# Patient Record
Sex: Female | Born: 1935 | Race: White | Hispanic: No | Smoking: Never smoker
Health system: Southern US, Community
[De-identification: ages and names within clinical notes are randomized; demographics above are authoritative.]

## PROBLEM LIST (undated history)

## (undated) DIAGNOSIS — R87619 Unspecified abnormal cytological findings in specimens from cervix uteri: Secondary | ICD-10-CM

## (undated) DIAGNOSIS — R519 Headache, unspecified: Secondary | ICD-10-CM

## (undated) DIAGNOSIS — E785 Hyperlipidemia, unspecified: Secondary | ICD-10-CM

## (undated) DIAGNOSIS — K644 Residual hemorrhoidal skin tags: Secondary | ICD-10-CM

## (undated) DIAGNOSIS — E213 Hyperparathyroidism, unspecified: Secondary | ICD-10-CM

## (undated) DIAGNOSIS — J45909 Unspecified asthma, uncomplicated: Secondary | ICD-10-CM

## (undated) DIAGNOSIS — K635 Polyp of colon: Secondary | ICD-10-CM

## (undated) DIAGNOSIS — R51 Headache: Secondary | ICD-10-CM

## (undated) DIAGNOSIS — K219 Gastro-esophageal reflux disease without esophagitis: Secondary | ICD-10-CM

## (undated) DIAGNOSIS — K589 Irritable bowel syndrome without diarrhea: Secondary | ICD-10-CM

## (undated) DIAGNOSIS — M1990 Unspecified osteoarthritis, unspecified site: Secondary | ICD-10-CM

## (undated) DIAGNOSIS — T7840XA Allergy, unspecified, initial encounter: Secondary | ICD-10-CM

## (undated) DIAGNOSIS — I10 Essential (primary) hypertension: Secondary | ICD-10-CM

## (undated) DIAGNOSIS — R42 Dizziness and giddiness: Secondary | ICD-10-CM

## (undated) DIAGNOSIS — M810 Age-related osteoporosis without current pathological fracture: Secondary | ICD-10-CM

## (undated) HISTORY — PX: POLYPECTOMY: SHX149

## (undated) HISTORY — DX: Unspecified osteoarthritis, unspecified site: M19.90

## (undated) HISTORY — DX: Residual hemorrhoidal skin tags: K64.4

## (undated) HISTORY — DX: Headache: R51

## (undated) HISTORY — DX: Hyperparathyroidism, unspecified: E21.3

## (undated) HISTORY — DX: Hyperlipidemia, unspecified: E78.5

## (undated) HISTORY — DX: Age-related osteoporosis without current pathological fracture: M81.0

## (undated) HISTORY — PX: UPPER GASTROINTESTINAL ENDOSCOPY: SHX188

## (undated) HISTORY — DX: Unspecified asthma, uncomplicated: J45.909

## (undated) HISTORY — DX: Essential (primary) hypertension: I10

## (undated) HISTORY — DX: Polyp of colon: K63.5

## (undated) HISTORY — DX: Irritable bowel syndrome without diarrhea: K58.9

## (undated) HISTORY — DX: Unspecified abnormal cytological findings in specimens from cervix uteri: R87.619

## (undated) HISTORY — PX: CATARACT EXTRACTION, BILATERAL: SHX1313

## (undated) HISTORY — DX: Allergy, unspecified, initial encounter: T78.40XA

## (undated) HISTORY — DX: Dizziness and giddiness: R42

## (undated) HISTORY — DX: Gastro-esophageal reflux disease without esophagitis: K21.9

## (undated) HISTORY — DX: Headache, unspecified: R51.9

## (undated) HISTORY — PX: COLONOSCOPY: SHX174

## (undated) HISTORY — PX: TONSILLECTOMY: SUR1361

---

## 1972-05-15 HISTORY — PX: BACK SURGERY: SHX140

## 1999-07-14 ENCOUNTER — Encounter: Payer: Self-pay | Admitting: Obstetrics and Gynecology

## 1999-07-14 ENCOUNTER — Encounter: Admission: RE | Admit: 1999-07-14 | Discharge: 1999-07-14 | Payer: Self-pay | Admitting: Obstetrics and Gynecology

## 2000-07-16 ENCOUNTER — Encounter: Admission: RE | Admit: 2000-07-16 | Discharge: 2000-07-16 | Payer: Self-pay | Admitting: Obstetrics and Gynecology

## 2000-07-16 ENCOUNTER — Encounter: Payer: Self-pay | Admitting: Obstetrics and Gynecology

## 2000-10-03 ENCOUNTER — Ambulatory Visit (HOSPITAL_COMMUNITY): Admission: RE | Admit: 2000-10-03 | Discharge: 2000-10-03 | Payer: Self-pay | Admitting: Gastroenterology

## 2000-10-03 ENCOUNTER — Encounter (INDEPENDENT_AMBULATORY_CARE_PROVIDER_SITE_OTHER): Payer: Self-pay | Admitting: Specialist

## 2000-11-29 ENCOUNTER — Ambulatory Visit (HOSPITAL_COMMUNITY): Admission: RE | Admit: 2000-11-29 | Discharge: 2000-11-29 | Payer: Self-pay | Admitting: *Deleted

## 2001-01-08 ENCOUNTER — Encounter: Payer: Self-pay | Admitting: *Deleted

## 2001-01-08 ENCOUNTER — Encounter: Admission: RE | Admit: 2001-01-08 | Discharge: 2001-01-08 | Payer: Self-pay | Admitting: *Deleted

## 2001-01-16 ENCOUNTER — Encounter: Payer: Self-pay | Admitting: *Deleted

## 2001-01-16 ENCOUNTER — Encounter: Admission: RE | Admit: 2001-01-16 | Discharge: 2001-01-16 | Payer: Self-pay | Admitting: *Deleted

## 2001-02-04 ENCOUNTER — Ambulatory Visit (HOSPITAL_COMMUNITY): Admission: RE | Admit: 2001-02-04 | Discharge: 2001-02-04 | Payer: Self-pay | Admitting: *Deleted

## 2001-02-04 ENCOUNTER — Encounter (INDEPENDENT_AMBULATORY_CARE_PROVIDER_SITE_OTHER): Payer: Self-pay | Admitting: *Deleted

## 2001-06-13 ENCOUNTER — Ambulatory Visit (HOSPITAL_COMMUNITY): Admission: RE | Admit: 2001-06-13 | Discharge: 2001-06-13 | Payer: Self-pay | Admitting: *Deleted

## 2001-06-13 ENCOUNTER — Encounter: Payer: Self-pay | Admitting: *Deleted

## 2001-07-22 ENCOUNTER — Encounter: Payer: Self-pay | Admitting: Obstetrics and Gynecology

## 2001-07-22 ENCOUNTER — Encounter: Admission: RE | Admit: 2001-07-22 | Discharge: 2001-07-22 | Payer: Self-pay | Admitting: Obstetrics and Gynecology

## 2001-07-25 ENCOUNTER — Encounter: Admission: RE | Admit: 2001-07-25 | Discharge: 2001-07-25 | Payer: Self-pay | Admitting: Obstetrics and Gynecology

## 2001-07-25 ENCOUNTER — Encounter: Payer: Self-pay | Admitting: Obstetrics and Gynecology

## 2001-09-05 ENCOUNTER — Encounter: Payer: Self-pay | Admitting: Family Medicine

## 2001-09-05 ENCOUNTER — Encounter: Admission: RE | Admit: 2001-09-05 | Discharge: 2001-09-05 | Payer: Self-pay | Admitting: Family Medicine

## 2002-07-28 ENCOUNTER — Encounter: Admission: RE | Admit: 2002-07-28 | Discharge: 2002-07-28 | Payer: Self-pay | Admitting: Obstetrics and Gynecology

## 2002-07-28 ENCOUNTER — Encounter: Payer: Self-pay | Admitting: Obstetrics and Gynecology

## 2003-02-20 ENCOUNTER — Encounter: Payer: Self-pay | Admitting: Family Medicine

## 2003-02-20 ENCOUNTER — Encounter: Admission: RE | Admit: 2003-02-20 | Discharge: 2003-02-20 | Payer: Self-pay | Admitting: Family Medicine

## 2003-06-23 ENCOUNTER — Encounter: Admission: RE | Admit: 2003-06-23 | Discharge: 2003-08-17 | Payer: Self-pay | Admitting: Family Medicine

## 2003-08-03 ENCOUNTER — Encounter: Admission: RE | Admit: 2003-08-03 | Discharge: 2003-08-03 | Payer: Self-pay | Admitting: Family Medicine

## 2003-11-10 ENCOUNTER — Encounter: Admission: RE | Admit: 2003-11-10 | Discharge: 2003-11-10 | Payer: Self-pay | Admitting: Family Medicine

## 2003-11-11 ENCOUNTER — Encounter: Admission: RE | Admit: 2003-11-11 | Discharge: 2003-11-11 | Payer: Self-pay | Admitting: Family Medicine

## 2004-08-08 ENCOUNTER — Encounter: Admission: RE | Admit: 2004-08-08 | Discharge: 2004-08-08 | Payer: Self-pay | Admitting: Obstetrics and Gynecology

## 2004-10-04 ENCOUNTER — Encounter: Admission: RE | Admit: 2004-10-04 | Discharge: 2004-10-04 | Payer: Self-pay | Admitting: Allergy and Immunology

## 2005-08-14 ENCOUNTER — Encounter: Admission: RE | Admit: 2005-08-14 | Discharge: 2005-08-14 | Payer: Self-pay | Admitting: Obstetrics and Gynecology

## 2005-08-22 ENCOUNTER — Encounter: Admission: RE | Admit: 2005-08-22 | Discharge: 2005-08-22 | Payer: Self-pay | Admitting: Obstetrics and Gynecology

## 2005-09-12 ENCOUNTER — Ambulatory Visit: Payer: Self-pay | Admitting: Gastroenterology

## 2005-09-18 ENCOUNTER — Ambulatory Visit: Payer: Self-pay | Admitting: Gastroenterology

## 2005-09-18 ENCOUNTER — Encounter (INDEPENDENT_AMBULATORY_CARE_PROVIDER_SITE_OTHER): Payer: Self-pay | Admitting: Specialist

## 2005-12-14 ENCOUNTER — Encounter: Admission: RE | Admit: 2005-12-14 | Discharge: 2005-12-14 | Payer: Self-pay | Admitting: Family Medicine

## 2006-07-24 ENCOUNTER — Encounter: Admission: RE | Admit: 2006-07-24 | Discharge: 2006-08-22 | Payer: Self-pay | Admitting: Family Medicine

## 2006-08-27 ENCOUNTER — Encounter: Admission: RE | Admit: 2006-08-27 | Discharge: 2006-08-27 | Payer: Self-pay | Admitting: Family Medicine

## 2006-10-18 ENCOUNTER — Ambulatory Visit: Payer: Self-pay | Admitting: Gastroenterology

## 2006-11-06 ENCOUNTER — Ambulatory Visit: Payer: Self-pay | Admitting: Gastroenterology

## 2007-02-21 ENCOUNTER — Encounter: Admission: RE | Admit: 2007-02-21 | Discharge: 2007-04-04 | Payer: Self-pay | Admitting: Family Medicine

## 2007-09-02 ENCOUNTER — Encounter: Admission: RE | Admit: 2007-09-02 | Discharge: 2007-09-02 | Payer: Self-pay | Admitting: Obstetrics and Gynecology

## 2007-09-09 DIAGNOSIS — E785 Hyperlipidemia, unspecified: Secondary | ICD-10-CM | POA: Insufficient documentation

## 2007-09-09 DIAGNOSIS — I10 Essential (primary) hypertension: Secondary | ICD-10-CM | POA: Insufficient documentation

## 2007-09-09 DIAGNOSIS — E7800 Pure hypercholesterolemia, unspecified: Secondary | ICD-10-CM

## 2008-06-15 ENCOUNTER — Encounter: Admission: RE | Admit: 2008-06-15 | Discharge: 2008-06-15 | Payer: Self-pay | Admitting: Orthopedic Surgery

## 2008-06-15 HISTORY — PX: CARPAL TUNNEL RELEASE: SHX101

## 2008-06-18 ENCOUNTER — Ambulatory Visit (HOSPITAL_BASED_OUTPATIENT_CLINIC_OR_DEPARTMENT_OTHER): Admission: RE | Admit: 2008-06-18 | Discharge: 2008-06-18 | Payer: Self-pay | Admitting: Orthopedic Surgery

## 2008-09-07 ENCOUNTER — Encounter: Admission: RE | Admit: 2008-09-07 | Discharge: 2008-09-07 | Payer: Self-pay | Admitting: Obstetrics and Gynecology

## 2008-12-07 ENCOUNTER — Inpatient Hospital Stay (HOSPITAL_COMMUNITY): Admission: EM | Admit: 2008-12-07 | Discharge: 2008-12-08 | Payer: Self-pay | Admitting: Emergency Medicine

## 2008-12-07 ENCOUNTER — Encounter (INDEPENDENT_AMBULATORY_CARE_PROVIDER_SITE_OTHER): Payer: Self-pay | Admitting: Internal Medicine

## 2008-12-08 ENCOUNTER — Encounter: Admission: RE | Admit: 2008-12-08 | Discharge: 2008-12-08 | Payer: Self-pay | Admitting: Internal Medicine

## 2008-12-24 ENCOUNTER — Encounter: Admission: RE | Admit: 2008-12-24 | Discharge: 2008-12-24 | Payer: Self-pay | Admitting: Family Medicine

## 2009-04-13 ENCOUNTER — Ambulatory Visit: Payer: Self-pay | Admitting: Gastroenterology

## 2009-04-26 ENCOUNTER — Encounter (INDEPENDENT_AMBULATORY_CARE_PROVIDER_SITE_OTHER): Payer: Self-pay | Admitting: *Deleted

## 2009-04-28 ENCOUNTER — Ambulatory Visit: Payer: Self-pay | Admitting: Gastroenterology

## 2009-05-10 ENCOUNTER — Ambulatory Visit: Payer: Self-pay | Admitting: Gastroenterology

## 2009-05-11 ENCOUNTER — Encounter: Payer: Self-pay | Admitting: Gastroenterology

## 2009-09-13 ENCOUNTER — Encounter: Admission: RE | Admit: 2009-09-13 | Discharge: 2009-09-13 | Payer: Self-pay | Admitting: Obstetrics and Gynecology

## 2009-11-12 ENCOUNTER — Encounter: Admission: RE | Admit: 2009-11-12 | Discharge: 2009-11-12 | Payer: Self-pay | Admitting: Family Medicine

## 2010-01-14 ENCOUNTER — Encounter: Admission: RE | Admit: 2010-01-14 | Discharge: 2010-01-14 | Payer: Self-pay | Admitting: Family Medicine

## 2010-01-25 ENCOUNTER — Emergency Department (HOSPITAL_COMMUNITY): Admission: EM | Admit: 2010-01-25 | Discharge: 2010-01-25 | Payer: Self-pay | Admitting: Emergency Medicine

## 2010-03-31 ENCOUNTER — Encounter
Admission: RE | Admit: 2010-03-31 | Discharge: 2010-05-11 | Payer: Self-pay | Source: Home / Self Care | Attending: Family Medicine | Admitting: Family Medicine

## 2010-05-30 ENCOUNTER — Encounter: Payer: Self-pay | Admitting: Gastroenterology

## 2010-06-05 ENCOUNTER — Encounter: Payer: Self-pay | Admitting: Obstetrics and Gynecology

## 2010-06-07 ENCOUNTER — Other Ambulatory Visit: Payer: Self-pay | Admitting: Gastroenterology

## 2010-06-07 ENCOUNTER — Ambulatory Visit
Admission: RE | Admit: 2010-06-07 | Discharge: 2010-06-07 | Payer: Self-pay | Source: Home / Self Care | Attending: Gastroenterology | Admitting: Gastroenterology

## 2010-06-07 ENCOUNTER — Encounter (INDEPENDENT_AMBULATORY_CARE_PROVIDER_SITE_OTHER): Payer: Self-pay | Admitting: *Deleted

## 2010-06-08 LAB — CBC WITH DIFFERENTIAL/PLATELET
Eosinophils Relative: 2.8 % (ref 0.0–5.0)
MCV: 94.5 fl (ref 78.0–100.0)
Monocytes Absolute: 0.5 10*3/uL (ref 0.1–1.0)
Neutrophils Relative %: 60.5 % (ref 43.0–77.0)
Platelets: 253 10*3/uL (ref 150.0–400.0)
WBC: 7.6 10*3/uL (ref 4.5–10.5)

## 2010-06-08 LAB — HEPATIC FUNCTION PANEL
AST: 18 U/L (ref 0–37)
Alkaline Phosphatase: 81 U/L (ref 39–117)
Total Bilirubin: 0.5 mg/dL (ref 0.3–1.2)

## 2010-06-08 LAB — BASIC METABOLIC PANEL
GFR: 71.34 mL/min (ref >60.00)
Glucose, Bld: 129 mg/dL — ABNORMAL HIGH (ref 70–99)
Potassium: 4.4 mEq/L (ref 3.5–5.1)
Sodium: 141 mEq/L (ref 135–145)

## 2010-06-08 LAB — IBC PANEL
Iron: 106 ug/dL (ref 42–145)
Saturation Ratios: 25 % (ref 20.0–50.0)
Transferrin: 303.2 mg/dL (ref 212.0–360.0)

## 2010-06-08 LAB — FOLATE: Folate: >24.8 ng/mL (ref >5.9)

## 2010-06-13 ENCOUNTER — Encounter: Payer: Self-pay | Admitting: Gastroenterology

## 2010-06-13 ENCOUNTER — Ambulatory Visit
Admission: RE | Admit: 2010-06-13 | Discharge: 2010-06-13 | Payer: Self-pay | Source: Home / Self Care | Attending: Gastroenterology | Admitting: Gastroenterology

## 2010-06-16 NOTE — Assessment & Plan Note (Signed)
Summary: Consult for colon   History of Present Illness Visit Type: Follow-up Visit Primary GI MD: [name] MD FACP FAGA Primary Provider: Catha Gosselin, MD  Requesting Provider: n/a Chief Complaint: constipation & hemorrhoids ongoing problem History of Present Illness:   77 year old Caucasian female that I have followed for many years for recurrent colon polyps with removal of a right colon adenoma one year ago. There was no high-grade dysplasia or evidence of carcinoma. She has had colonoscopies going back for many years, and suffers from persistent perianal irritation from a prominent anterior skin tag and external hemorrhoids.  She has known hepatic cysts  and a probable small hepatic hemangioma per previous CT scans. She had been a previous patient of [facility], and Dr. Kinnie Scales. She also suffers from chronic dyspepsia, chronic constipation, and is currently on daily MiraLax, Nexium, Colace, and folic acid.  She also now complains of intermittent pain in her right lower quadrant, rectal discomfort, but no rectal bleeding. Her appetite is good and her weight has been stable. She comes in now for pre-evaluation for upcoming colonoscopy followup.   GI Review of Systems    Reports abdominal pain, bloating, and  nausea.     Location of  Abdominal pain: right side.    Denies acid reflux, belching, chest pain, dysphagia with liquids, dysphagia with solids, heartburn, loss of appetite, vomiting, vomiting blood, weight loss, and  weight gain.      Reports constipation and  hemorrhoids.     Denies anal fissure, black tarry stools, change in bowel habit, diarrhea, diverticulosis, fecal incontinence, heme positive stool, irritable bowel syndrome, jaundice, light color stool, liver problems, rectal bleeding, and  rectal pain.    Current Medications (verified): 1)  Lopressor Hct 50-25 Mg Tabs (Metoprolol-Hydrochlorothiazide) .... Take 1 Tablet By Mouth Two Times A Day 2)  Crestor  20 Mg Tabs (Rosuvastatin Calcium) .... 1/2 Tablet By Mouth Once Daily 3)  Folic Acid 1 Mg Tabs (Folic Acid) .Marland Kitchen.. 1 By Mouth Qd 4)  Zetia 10 Mg Tabs (Ezetimibe) .... One Tablet By Mouth Once Daily 5)  Ester-C 500-550 Mg Tabs (Bioflavonoid Products) .... One Tablet By Mouth Once Daily 6)  Citracal Plus  Tabs (Multiple Minerals-Vitamins) .... One Tablet By Mouth Once Daily 7)  Nexium 40 Mg Cpdr (Esomeprazole Magnesium) .Marland Kitchen.. 1 By Mouth Qd 8)  Colace 100 Mg Caps (Docusate Sodium) .Marland Kitchen.. 1 By Mouth Bid 9)  Benicar 20 Mg Tabs (Olmesartan Medoxomil) .... Take 1 Tablet By Mouth Once Daily 10)  Spironolactone 25 Mg Tabs (Spironolactone) .... Take 1 Tablet By Mouth Once Daily 11)  Benefiber  Powd (Wheat Dextrin) .... Take 2 Teaspoons As Directed Daily  Allergies (verified): 1)  ! Amoxicillin 2)  ! Flagyl  Past History:  Past medical, surgical, family and social histories (including risk factors) reviewed for relevance to current acute and chronic problems.  Past Medical History: Reviewed history from 04/13/2009 and no changes required. Current Problems:  ADENOMATOUS COLONIC POLYP (ICD-211.3) HELICOBACTER PYLORI INFECTION, HX OF (ICD-V12.71) HYPERCHOLESTEROLEMIA (ICD-272.0) HYPERTENSION (ICD-401.9) EXTERNAL HEMORRHOIDS (ICD-455.3)  Past Surgical History: Back Surgery Carpal Tunnel Release  Family History: Reviewed history from 04/13/2009 and no changes required. No FH of Colon Cancer:  Social History: Reviewed history from 04/13/2009 and no changes required. Occupation: Retired Widowed  Patient has never smoked.  Alcohol Use - no Illicit Drug Use - no Daily Caffeine Use: Occ   Review of Systems       The patient complains of back pain, headaches-new,  sleeping problems, and swelling of feet/legs.  The patient denies allergy/sinus, anemia, anxiety-new, arthritis/joint pain, blood in urine, breast changes/lumps, change in vision, confusion, cough, coughing up blood, depression-new,  fainting, fatigue, fever, hearing problems, heart murmur, heart rhythm changes, itching, menstrual pain, muscle pains/cramps, night sweats, nosebleeds, pregnancy symptoms, shortness of breath, skin rash, sore throat, swollen lymph glands, thirst - excessive , urination - excessive , urination changes/pain, urine leakage, vision changes, and voice change.    Vital Signs:  Patient profile:   77 year old female Height:      62 inches Weight:      152.50 pounds BMI:     27.99 Pulse rate:   72 / minute Pulse rhythm:   regular BP sitting:   134 / 76  (left arm) Cuff size:   regular  Vitals Entered By: June McMurray CMA Duncan Dull) (June 07, 2010 3:14 PM)  Physical Exam  General:  Well developed, well nourished, no acute distress.healthy appearing.   Head:  Normocephalic and atraumatic. Eyes:  PERRLA, no icterus.exam deferred to patient's ophthalmologist.   Abdomen:  Soft, nontender and nondistended. No masses, hepatosplenomegaly or hernias noted. Normal bowel sounds. Rectal:  Prominent anterior skin tag with some external hemorrhoidal tissue but no fissures or fistulae. Rectal exam shows no masses or tenderness with the stool that is trace guaiac positive. Msk:  Symmetrical with no gross deformities. Normal posture. Extremities:  No clubbing, cyanosis, edema or deformities noted. Neurologic:  Alert and  oriented x4;  grossly normal neurologically. Psych:  Alert and cooperative. Normal mood and affect.depressed affect.     Impression & Recommendations:  Problem # 1:  DYSPEPSIA, CHRONIC (ICD-536.8) Assessment Improved continue Nexium which she uses on a p.r.n. basis.She has been treated in the past for Helicobacter infection with good response.  Problem # 2:  ADENOMATOUS COLONIC POLYP (ICD-211.3) Assessment: Unchanged colonoscopy followup ASAP.labs have been ordered today including CBC and anemia profile.  Problem # 3:  CONSTIPATION (ICD-564.00) Assessment: Improved continue MiraLax,  Benefiber, Colace, and liberal p.o. fluids as tolerated.  Other Orders: TLB-CBC Platelet - w/Differential (85025-CBCD) TLB-BMP (Basic Metabolic Panel-BMET) (80048-METABOL) TLB-Hepatic/Liver Function Pnl (80076-HEPATIC) TLB-TSH (Thyroid Stimulating Hormone) (84443-TSH) TLB-B12, Serum-Total ONLY (34742-V95) TLB-Ferritin (82728-FER) TLB-Folic Acid (Folate) (82746-FOL) TLB-IBC Pnl (Iron/FE;Transferrin) (83550-IBC) Colonoscopy (Colon)  Patient Instructions: 1)  Copy sent to :  Catha Gosselin, MD  2)  Please go to the basement today for your labs.  3)  Your prescription(s) have been sent to you pharmacy.  4)  Your procedure has been scheduled for 06/13/2010, please follow the seperate instructions.  5)  [facility] Patient Information Guide given to patient.  6)  Colonoscopy and Flexible Sigmoidoscopy brochure given.  7)  The medication list was reviewed and reconciled.  All changed / newly prescribed medications were explained.  A complete medication list was provided to the patient / caregiver. Prescriptions: HYDROCORTISONE ACE-PRAMOXINE 2.5-1 % CREA (HYDROCORTISONE ACE-PRAMOXINE) apply to rectum two times a day  #1 tube x 3   Entered by:   Harlow Mares CMA (AAMA)   Authorized by:   Mardella Layman MD [facility]   Signed by:   Harlow Mares CMA (AAMA) on 06/07/2010   Method used:   Electronically to        [facility]* (retail)       [address] [place], [place]  [address]  Ph: [phone] or [phone]       Fax: [phone]   RxID:   [id number] MOVIPREP 100 GM  SOLR (PEG-KCL-NACL-NASULF-NA ASC-C) As per prep instructions.  #1 x 0   Entered by:   Harlow Mares CMA (AAMA)   Authorized by:   Mardella Layman MD [facility]   Signed by:   Harlow Mares CMA (AAMA) on 06/07/2010   Method used:   Electronically to        [facility]* (retail)       [address] [place], [place]  [address]       Ph: [phone] or [phone]       Fax: [phone]   RxID:   [id number]

## 2010-06-16 NOTE — Letter (Signed)
Summary: Colonoscopy Letter  [facility]  [address]   [place], [place] [address]   Phone: [phone]  Fax: [phone]      May 30, 2010 MRN: [id number]   Robin [name] [address] [place], [place]  [address]   Dear Ms. Terhune,   According to your medical record, it is time for you to schedule a Colonoscopy. The American Cancer Society recommends this procedure as a method to detect early colon cancer. Patients with a family history of colon cancer, or a personal history of colon polyps or inflammatory bowel disease are at increased risk.  This letter has been generated based on the recommendations made at the time of your procedure. If you feel that in your particular situation this may no longer apply, please contact our office.  Please call our office at [phone] to schedule this appointment or to update your records at your earliest convenience.  Thank you for cooperating with [place] to provide you with the very best care possible.   Sincerely,   [name]. Jarold Motto, M.D.  [facility] [phone]

## 2010-06-16 NOTE — Letter (Signed)
Summary: [facility] Instructions  [facility]  [address] [place], [place] [address]   Phone: [phone]  Fax: [phone]       Robin [name]    March 18, 1934    MRN: [id number]        Procedure Day /Date: Monday 06/13/2010     Arrival Time: 3pm     Procedure Time: 4pm       Location of Procedure:                    X  [facility] (4th Floor)                        PREPARATION FOR COLONOSCOPY WITH MOVIPREP   Starting 5 days prior to your procedure 06/08/2010 do not eat nuts, seeds, popcorn, corn, beans, peas,  salads, or any raw vegetables.  Do not take any fiber supplements (e.g. Metamucil, Citrucel, and Benefiber).  THE DAY BEFORE YOUR PROCEDURE         Sunday 06/12/2010  1.  Drink clear liquids the entire day-NO SOLID FOOD  2.  Do not drink anything colored red or purple.  Avoid juices with pulp.  No orange juice.  3.  Drink at least 64 oz. (8 glasses) of fluid/clear liquids during the day to prevent dehydration and help the prep work efficiently.  CLEAR LIQUIDS INCLUDE: Water Jello Ice Popsicles Tea (sugar ok, no milk/cream) Powdered fruit flavored drinks Coffee (sugar ok, no milk/cream) Gatorade Juice: apple, white grape, white cranberry  Lemonade Clear bullion, consomm, broth Carbonated beverages (any kind) Strained chicken noodle soup Hard Candy                             4.  In the morning, mix first dose of MoviPrep solution:    Empty 1 Pouch A and 1 Pouch B into the disposable container    Add lukewarm drinking water to the top line of the container. Mix to dissolve    Refrigerate (mixed solution should be used within 24 hrs)  5.  Begin drinking the prep at 5:00 p.m. The MoviPrep container is divided by 4 marks.   Every 15 minutes drink the solution down to the next mark (approximately 8 oz) until the full liter is complete.   6.  Follow completed prep with 16 oz of clear liquid of your choice (Nothing red or purple).   Continue to drink clear liquids until bedtime.  7.  Before going to bed, mix second dose of MoviPrep solution:    Empty 1 Pouch A and 1 Pouch B into the disposable container    Add lukewarm drinking water to the top line of the container. Mix to dissolve    Refrigerate  THE DAY OF YOUR PROCEDURE      Monday 06/13/2010  Beginning at 11:00am (5 hours before procedure):         1. Every 15 minutes, drink the solution down to the next mark (approx 8 oz) until the full liter is complete.  2. Follow completed prep with 16 oz. of clear liquid of your choice.    3. You may drink clear liquids until 2pm (2 HOURS BEFORE PROCEDURE).   MEDICATION INSTRUCTIONS  Unless otherwise instructed, you should take regular prescription medications with a small sip of water   as early as possible the morning of your procedure.  OTHER INSTRUCTIONS  You will need a responsible adult at least 75 years of age to accompany you and drive you home.   This person must remain in the waiting room during your procedure.  Wear loose fitting clothing that is easily removed.  Leave jewelry and other valuables at home.  However, you may wish to bring a book to read or  an iPod/MP3 player to listen to music as you wait for your procedure to start.  Remove all body piercing jewelry and leave at home.  Total time from sign-in until discharge is approximately 2-3 hours.  You should go home directly after your procedure and rest.  You can resume normal activities the  day after your procedure.  The day of your procedure you should not:   Drive   Make legal decisions   Operate machinery   Drink alcohol   Return to work  You will receive specific instructions about eating, activities and medications before you leave.    The above instructions have been reviewed and explained to me by   _______________________    I fully understand and can verbalize these instructions  _____________________________ Date _________

## 2010-06-22 NOTE — Procedures (Signed)
Summary: Colonoscopy  Patient: [name] Note: All result statuses are Final unless otherwise noted.  Tests: (1) Colonoscopy (COL)   COL Colonoscopy           DONE     [facility]     [address]     [place], [place]  [address]           COLONOSCOPY PROCEDURE REPORT           PATIENT:  Robin [name], Robin [name]  MR#:  [id number]     BIRTHDATE:  06/27/1933, 76 yrs. old  GENDER:  female     ENDOSCOPIST:  [name]. Jarold Motto, MD, [facility]     REF. BY:     PROCEDURE DATE:  06/13/2010     PROCEDURE:  Diagnostic Colonoscopy     ASA CLASS:  Class II     INDICATIONS:  history of pre-cancerous (adenomatous) colon polyps     PRIOR LARGE RIGHT COLON POLYPS PIECEMEAL REMOVED.     MEDICATIONS:   Fentanyl 50 mcg IV, Versed 6 mg IV           DESCRIPTION OF PROCEDURE:   After the risks benefits and     alternatives of the procedure were thoroughly explained, informed     consent was obtained.  Digital rectal exam was performed and     revealed no abnormalities.   The LB 180AL [id number] endoscope was     introduced through the anus and advanced to the cecum, which was     identified by both the appendix and ileocecal valve, without     limitations.  The quality of the prep was excellent, using     MoviPrep.  The instrument was then slowly withdrawn as the colon     was fully examined.     <<PROCEDUREIMAGES>>           FINDINGS:  No polyps or cancers were seen.  External hemorrhoids     were found.  This was otherwise a normal examination of the colon.     Retroflexed views in the rectum revealed hemorrhoids.    The scope     was then withdrawn from the patient and the procedure completed.           COMPLICATIONS:  None     ENDOSCOPIC IMPRESSION:     1) No polyps or cancers     2) External hemorrhoids     3) Otherwise normal examination     RECOMMENDATIONS:     1) Repeat Colonoscopy in 3 years.     REPEAT EXAM:  No           ______________________________     [name]. Jarold Motto, MD,  [name]           CC:  Catha Gosselin, MD           n.     [name] Kitchen   [name]. Tailyn Hantz at 06/13/2010 04:16 PM           [name], [id number]  Note: An exclamation mark (!) indicates a result that was not dispersed into the flowsheet. Document Creation Date: 06/13/2010 4:16 PM _______________________________________________________________________  (1) Order result status: Final Collection or observation date-time: 06/13/2010 16:07 Requested date-time:  Receipt date-time:  Reported date-time:  Referring Physician:   Ordering Physician: [name] [phone]) Specimen Source:  Source: [name] Order Number: [id number] Lab site:   Appended Document: Colonoscopy    Clinical Lists Changes  Observations: Added new observation  of COLONNXTDUE: 05/2013 (06/13/2010 16:30)

## 2010-07-28 LAB — BASIC METABOLIC PANEL
Calcium: 10.6 mg/dL — ABNORMAL HIGH (ref 8.4–10.5)
GFR calc Af Amer: >60 mL/min (ref >60)
GFR calc non Af Amer: >60 mL/min (ref >60)
Glucose, Bld: 97 mg/dL (ref 70–99)
Sodium: 140 mEq/L (ref 135–145)

## 2010-07-28 LAB — URINALYSIS, ROUTINE W REFLEX MICROSCOPIC
Bilirubin Urine: NEGATIVE
Hgb urine dipstick: NEGATIVE
Nitrite: NEGATIVE
Specific Gravity, Urine: 1.01 (ref 1.005–1.030)
pH: 8 (ref 5.0–8.0)

## 2010-07-28 LAB — DIFFERENTIAL
Basophils Absolute: 0 10*3/uL (ref 0.0–0.1)
Eosinophils Absolute: 0.2 10*3/uL (ref 0.0–0.7)
Eosinophils Relative: 3 % (ref 0–5)
Lymphocytes Relative: 16 % (ref 12–46)
Lymphs Abs: 1.4 10*3/uL (ref 0.7–4.0)
Monocytes Absolute: 0.6 10*3/uL (ref 0.1–1.0)

## 2010-07-28 LAB — CBC
Hemoglobin: 13.7 g/dL (ref 12.0–15.0)
MCHC: 33.7 g/dL (ref 30.0–36.0)
Platelets: 258 10*3/uL (ref 150–400)
RDW: 13.2 % (ref 11.5–15.5)

## 2010-07-28 LAB — URINE MICROSCOPIC-ADD ON

## 2010-08-21 LAB — URINALYSIS, ROUTINE W REFLEX MICROSCOPIC
Bilirubin Urine: NEGATIVE
Glucose, UA: NEGATIVE mg/dL
Ketones, ur: NEGATIVE mg/dL
Protein, ur: NEGATIVE mg/dL
pH: 7.5 (ref 5.0–8.0)

## 2010-08-21 LAB — DIFFERENTIAL
Basophils Absolute: 0 10*3/uL (ref 0.0–0.1)
Eosinophils Relative: 4 % (ref 0–5)
Lymphocytes Relative: 31 % (ref 12–46)
Lymphs Abs: 1.6 10*3/uL (ref 0.7–4.0)
Monocytes Absolute: 0.4 10*3/uL (ref 0.1–1.0)
Monocytes Relative: 8 % (ref 3–12)
Neutro Abs: 2.8 10*3/uL (ref 1.7–7.7)

## 2010-08-21 LAB — POCT CARDIAC MARKERS
CKMB, poc: <1 ng/mL — ABNORMAL LOW (ref 1.0–8.0)
Troponin i, poc: <0.05 ng/mL (ref 0.00–0.09)

## 2010-08-21 LAB — CBC
HCT: 39.2 % (ref 36.0–46.0)
Hemoglobin: 13.4 g/dL (ref 12.0–15.0)
RBC: 4.11 MIL/uL (ref 3.87–5.11)
RDW: 12.6 % (ref 11.5–15.5)
WBC: 5 10*3/uL (ref 4.0–10.5)

## 2010-08-21 LAB — BASIC METABOLIC PANEL
Calcium: 10.3 mg/dL (ref 8.4–10.5)
GFR calc Af Amer: >60 mL/min (ref >60)
GFR calc non Af Amer: >60 mL/min (ref >60)
Potassium: 4.1 mEq/L (ref 3.5–5.1)
Sodium: 140 mEq/L (ref 135–145)

## 2010-08-21 LAB — HEMOGLOBIN A1C
Hgb A1c MFr Bld: 5.2 % (ref 4.6–6.1)
Mean Plasma Glucose: 103 mg/dL

## 2010-08-21 LAB — COMPREHENSIVE METABOLIC PANEL
ALT: 19 U/L (ref 0–35)
Alkaline Phosphatase: 69 U/L (ref 39–117)
BUN: 6 mg/dL (ref 6–23)
CO2: 29 mEq/L (ref 19–32)
Calcium: 10 mg/dL (ref 8.4–10.5)
GFR calc non Af Amer: >60 mL/min (ref >60)
Glucose, Bld: 90 mg/dL (ref 70–99)
Sodium: 145 mEq/L (ref 135–145)

## 2010-08-21 LAB — VITAMIN B12: Vitamin B-12: 450 pg/mL (ref 211–911)

## 2010-08-21 LAB — LIPID PANEL
LDL Cholesterol: 66 mg/dL (ref 0–99)
Total CHOL/HDL Ratio: 2.2 RATIO
Triglycerides: 45 mg/dL (ref <150)
VLDL: 9 mg/dL (ref 0–40)

## 2010-08-21 LAB — PROTIME-INR: Prothrombin Time: 13 seconds (ref 11.6–15.2)

## 2010-08-21 LAB — CARDIAC PANEL(CRET KIN+CKTOT+MB+TROPI): Relative Index: INVALID (ref 0.0–2.5)

## 2010-08-21 LAB — URINE CULTURE: Colony Count: 10000

## 2010-08-23 ENCOUNTER — Other Ambulatory Visit: Payer: Self-pay | Admitting: Obstetrics and Gynecology

## 2010-08-23 DIAGNOSIS — Z1231 Encounter for screening mammogram for malignant neoplasm of breast: Secondary | ICD-10-CM

## 2010-08-30 LAB — BASIC METABOLIC PANEL
BUN: 12 mg/dL (ref 6–23)
Calcium: 10.1 mg/dL (ref 8.4–10.5)
Creatinine, Ser: 0.8 mg/dL (ref 0.4–1.2)
GFR calc non Af Amer: >60 mL/min (ref >60)

## 2010-08-30 LAB — POCT HEMOGLOBIN-HEMACUE: Hemoglobin: 14.9 g/dL (ref 12.0–15.0)

## 2010-09-26 ENCOUNTER — Ambulatory Visit
Admission: RE | Admit: 2010-09-26 | Discharge: 2010-09-26 | Disposition: A | Discharge status: Home / Self Care | Payer: Medicare Other | Source: Ambulatory Visit | Attending: Obstetrics and Gynecology | Admitting: Obstetrics and Gynecology

## 2010-09-26 DIAGNOSIS — Z1231 Encounter for screening mammogram for malignant neoplasm of breast: Secondary | ICD-10-CM

## 2010-09-27 NOTE — Assessment & Plan Note (Signed)
[facility]                         GASTROENTEROLOGY OFFICE NOTE   NAME:Robin [name], Robin [name]                      MRN:          [id number]  DATE:10/18/2006                            DOB:          October 27, 1933    [name] continues with constipation, straining and some discomfort around  her rectum.  She is concerned because she has an enlarging skin tag  which she feels when she wipes.  She really denies abdominal pain or  other GI symptoms.  Her colonoscopy was done a year ago and she had an  adenomatous polyp removed without difficulty.  She had external  hemorrhoids noted at that time.   Her vital signs were all stable.  Abdominal exam was unremarkable.  She  does have some nonthrombosed external hemorrhoids with posterior skin  tag.  Rectal exam was unremarkable except for a very hard, almost  impacted stool in the rectal vault that was guaiac negative.   ASSESSMENT:  1. Severe constipation with straining probably exacerbating her      external hemorrhoids.  2. Prominent, but not extremely large or ulcerated or irritated      posterior skin tag.   RECOMMENDATIONS:  1. High fiber diet with liberal p.o. fluids.  2. Trial of Amitiza 24 mcg twice a day as tolerated.  3. Twice a day Sitz baths with local anal cream and cleansing wipes to      her perianal rectal area.  4. GI followup in 2 weeks' time.  She may need referral to Dr. Kendrick Ranch for hemorrhoidectomy depending on her clinical course.     [name]. Jarold Motto, MD, [facility], FAGA  Electronically Signed    DRP/MedQ  DD: 10/18/2006  DT: 10/18/2006  Job #: [id number]

## 2010-09-27 NOTE — H&P (Signed)
NAME:  [name], [name] NO.:  [id number]   MEDICAL RECORD NO.:  [id number]          PATIENT TYPE:  EMS   LOCATION:  [facility]                         FACILITY:  [facility]   PHYSICIAN:  Lucile Crater, MD         DATE OF BIRTH:  05-Jun-1933   DATE OF ADMISSION:  12/06/2008  DATE OF DISCHARGE:                              HISTORY & PHYSICAL   PRIMARY CARE PHYSICIAN:  Chales Salmon. Abigail Miyamoto, M.D.   CHIEF COMPLAINT:  Blurry vision.   HISTORY OF PRESENT ILLNESS:  Ms. Robin [name] is a 77 year old female with a  history of hypertension and hyperlipidemia.  She comes to the ER after  she had a transient episode of blurry vision bilaterally that lasted for  about 45 minutes.  She reports that at that time, she also had problems  with her gait, and she was staggering when walking.  She did not have  any headache or presyncopal episode.  She denies having any fever.  She  denies having any palpitations at the time.  There is no relation of the  dizziness to her physician.  She never had these symptoms before.  She  denies having any motor weakness.  She had a CT scan of her head done in  the ER, which was negative for any acute changes.  There is mention of a  questionable old lacunar infarct.   REVIEW OF SYSTEMS:  A complete review of systems was done, which  included general, HEENT, cardiovascular, respiratory, GI, GU, endocrine,  musculoskeletal, skin, neurologic, psychiatric, and all were within  normal limits except for what was mentioned above.   PAST MEDICAL HISTORY:  1. Hypertension.  2. Hyperlipidemia.  3. Left carpal tunnel syndrome, status post release.  4. Left rotator cuff tendinopathy.  5. Anxiety.  6. Remote history of vertigo.  7. Degenerative disk disease.   SOCIAL HISTORY:  There is no history of tobacco abuse, alcohol, or  drugs.   FAMILY HISTORY:  CVA.   ALLERGIES:  AMOXICILLIN, METRONIDAZOLE.   HOME MEDICATIONS:  1. Azor 5/20 1 tablet p.o. once daily.  2.  Calcium citrate once daily.  3. Crestor once daily.  4. Folic acid 1 mg once daily.  5. Vitamin E once daily.  6. Vitamin B12 shots monthly.   PHYSICAL EXAMINATION:  T max 96.6 oral, blood pressure 174/68, pulse  rate 85, respiratory rate 18, O2 sats 98%.  GENERAL APPEARANCE:  Patient is not in any acute distress.  Alert,  awake, and oriented to time, place, person, and situation.  Afebrile.  HEENT:  Normocephalic and atraumatic.  Pupils are equal, round and  reactive to light and accommodation.  Extraocular muscles are intact.  Mucous membranes are moist.  NECK:  Supple.  No JVD.  No lymphadenopathy.  No carotid bruits.  No  neck stiffness.  CARDIOVASCULAR:  Regular rate and rhythm.  No murmurs, rubs or gallops.  LUNGS:  Clear to auscultation bilaterally.  ABDOMEN:  Soft, nondistended, nontender.  No hepatosplenomegaly.  EXTREMITIES:  No clubbing, cyanosis or edema.  NEUROLOGIC:  Cranial nerves II-XII are intact.  Motor strength 5/5  throughout.  DTRs are 2+ bilaterally.  Sensation is intact.   LABS/STUDIES:  CBC with diff:  WBC 5000, hemoglobin 13.4, hematocrit 39,  platelets 262.  Differential is normal.  Sodium 140, potassium 4.1,  chloride 108, bicarb 25.  Blood glucose 121.  BUN 9, creatinine 0.64.  CK-MB less than 1.  Troponin less than 0.05.  Urinalysis within normal  limits.   CT of the head without contrast dated December 06, 2008:  No acute  intracranial abnormalities.  Minimal atrophy with small vessel chronic  ischemic changes of cerebral white matter.  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   Chest x-ray, 2 view, dated December 06, 2008:  Bronchitic changes with  minimal bibasilar atelectasis.   ASSESSMENT/PLAN:  1. Transient ischemic attack:  Patient had transient blurry vision in      both eyes.  Will evaluate for any posterior circulation strokes.      Will get an MRI of the head.  Will also get an MRA of the head.      Will evaluate for any critical  carotid stenosis with carotid      Dopplers.  Will evaluate for an embolic source in the heart with a      2D echocardiogram.  Will start her on aspirin 325 mg once daily.      Will continue her statin.  Will do neuro checks periodically.  Will      place the patient on telemetry.  The patient has issues with MRI.      She says she cannot do it in a closed MRI.  We will try to get an      open MRI done.  We will give her anxiolytics before she gets an MRI      done.  2. Hypertension:  Permissive.  We would not be very aggressive, given      the risk of hypoperfusion.  3. Hyperlipidemia:  Will check fasting lipid panel in the morning.  We      will continue her statin.  4. Deep venous thrombosis prophylaxis:  SCDs.  5. Fluids, electrolytes, nutrition:  We will start her on normal      saline 75 ml/hr.  Will replace her electrolytes as needed.  She      will be placed on an AHA diet if she passes the swallow evaluation.      Lucile Crater, MD  Electronically Signed     TA/MEDQ  D:  12/07/2008  T:  12/07/2008  Job:  [id number]

## 2010-09-27 NOTE — Op Note (Signed)
NAME:  Robin [name], Robin [name]               ACCOUNT NO.:  [id number]   MEDICAL RECORD NO.:  [id number]          PATIENT TYPE:  AMB   LOCATION:  DSC                          FACILITY:  [facility]   PHYSICIAN:  Katy Fitch. Sypher, M.D. DATE OF BIRTH:  01/10/1934   DATE OF PROCEDURE:  06/18/2008  DATE OF DISCHARGE:                               OPERATIVE REPORT   PREOPERATIVE DIAGNOSES:  Bilateral carpal tunnel syndrome and chronic  bilateral shoulder pain with MRI evidence of rotator cuff tendinopathy;  no rotator cuff full-thickness tear; acromioclavicular arthropathy and  stress response in humeral head.   POSTOPERATIVE DIAGNOSES:  Minimal adhesive capsulitis with limitation of  internal rotation only, left shoulder and significant left carpal tunnel  syndrome.   OPERATION:  1. Examination of left shoulder under anesthesia and gentle      manipulation and internal and external rotation.  2. Through separate incision, left carpal tunnel release.   OPERATIONS:  Katy Fitch. Sypher, MD   ASSISTANT:  Marveen Reeks Dasnoit, PA-C   ANESTHESIA:  General by LMA.   SUPERVISING ANESTHESIOLOGIST:  Kaylyn Layer. Michelle Piper, MD   INDICATIONS:  Robin [name] is a 77 year old woman referred through the  courtesy of Dr. Henrine Screws.  She has a history of significant left  arm pain, shoulder pain, and left hand numbness.  In the fall of 2009,  she was worked up with electrodiagnostic studies that revealed evidence  of carpal tunnel syndrome bilaterally.  She was treated with steroid  injections and splinting.  She also had persistent degenerative change  in her cervical spine and stiffness of her left shoulder.   She embarked on a lengthy therapy course which improved her shoulder  range of motion and improved her neck symptoms.  In early 2010, she  developed recurrent shoulder pain and persistent hand numbness,  therefore I recommended proceeding to release of the left transverse  carpal ligament and examination of  the shoulder under anesthesia in an  effort to help with her adhesive capsulitis symptoms.   After informed consent, she is brought to the operating room at this  time.   PROCEDURE:  Robin [name] was brought to the operating room and placed  in supine position upon the operating table.   Following an anesthesia consult with Dr. Michelle Piper, general anesthesia by  LMA technique was recommended and accepted by Robin [name].  She was  brought to room #8 at the [facility] and placed in the supine  position upon the operating table and under Dr. Deirdre Priest direct  supervision, general anesthesia by LMA technique was induced.   With the consent of anesthesia, we carefully examined the left shoulder  under general anesthesia and relaxation of her periscapular muscles.  She had combined elevation of her shoulder to 180 degrees, external  rotation at 90 degrees, abduction of 95 degrees, and rotation initially  of 70 degrees.  With gentle manipulation, it was increased to 80  degrees.  She appeared to have a mild adhesive capsulitis.  It appeared  that her shoulder pain might be due to a combination of her degenerative  disk disease and perhaps referred pain from her chronic carpal tunnel  syndrome.   It appeared that her prior therapy had helped significantly relieved the  stiffness of her glenohumeral joint on the left.   The left arm was then prepped with Betadine soap and solution, sterilely  draped.  A pneumatic tourniquet was applied to the proximal left  brachium.  Due to mild systolic pressure elevation, her tourniquet was  initially set at 250 mmHg and then raised to 300 mmHg.   The left arm was exsanguinated with Esmarch bandage and arterial  tourniquet was inflated to 250 mmHg and later the procedure commenced.  A short incision was fashioned in the line of the ring finger of the  palm.  Subcutaneous tissues were carefully divided via the palmar  fascia.  This was split  longitudinally to reveal the common sensory  branch of the median nerve.  These were followed back to the transcarpal  ligament which was gently isolated from the median nerve.  At the  transverse carpal ligament, it was beginning to be released.  She had an  elevation of systolic blood pressure which led [place] to re-exsanguinate the  limb with an Esmarch bandage and inflate the arterial tourniquet to 300  mmHg.  The transcarpal ligament was fully released into the distal  forearm.  This widely opened the carpal canal.  Bleeding points were  electrocauterized with bipolar current followed by repair of the skin  with intradermal 3-0 Prolene and Steri-Strips.  Lidocaine 2% was  infiltrated for postop analgesia along the path of the incision and the  distal forearm over the median nerve.  There were no apparent  complications.   Robin [name] tolerated the surgery and anesthesia well.  She was  transferred to the recovery room with stable signs.   We will continue to monitor the progress of her cervical degenerative  disk disease.  We will see her back in followup in the office in 1 week  to reexamine her hand and shoulder.  She is provided a prescription for  Darvocet-N 100 one p.o. q4-6 h. p.r.n. pain, 20 tablets without refill.      Katy Fitch Sypher, M.D.  Electronically Signed     RVS/MEDQ  D:  06/18/2008  T:  06/18/2008  Job:  [id number]

## 2010-09-27 NOTE — Assessment & Plan Note (Signed)
[facility]                         GASTROENTEROLOGY OFFICE NOTE   NAME:[name], Robin CARMON                      MRN:          [id number]  DATE:11/06/2006                            DOB:          08-Jan-1934    [name] is much better symptomatically with her hemorrhoids.  However, she  could not take Amitiza because of severe headache.   Her vital signs were all normal, as was abdominal exam.  Inspection of the rectum shows an external posterior hemorrhoid, which  was not inflamed or irritated.   I have gone ahead and placed [name] on MiraLax 8 ounces at bedtime to see  how she does symptomatically.  This dose is to be adjusted accordingly.  She is to use p.r.n. local anal care of her hemorrhoids as previously  outlined.     [name]. Jarold Motto, MD, Caleen Essex, FAGA  Electronically Signed    DRP/MedQ  DD: 11/06/2006  DT: 11/06/2006  Job #: [id number]

## 2010-09-30 NOTE — Procedures (Signed)
[facility]  Patient:    Robin [name], Robin [name]                   MRN: [id number] Proc. Date: 10/03/00 Attending:  Verlin Grills, M.D. CC:         Chales Salmon. Abigail Miyamoto, M.D., [facility], [address] [facility]                           Procedure Report  PROCEDURE:  Esophagogastroduodenoscopy.  REFERRING PHYSICIAN:  Chales Salmon. Abigail Miyamoto, M.D.  PROCEDURE INDICATION:  Robin [name] (date of birth 2034-05-03) is a 77 year old female, who presented to Dr. Henrine Screws with epigastric pain. Her gallbladder ultrasound was normal.  Her upper GI x-ray series revealed tertiary contractions of the esophagus and a small hiatal hernia.  Her H. pylori serology was positive.  Robin [name] epigastric pain worsened when she was placed on Helidac therapy to eradicate possible H. pylori antral gastritis.  She is now on Nexium.  INFORMED CONSENT:  I discussed with Robin [name] the complications associated with esophagogastroduodenoscopy including intestinal bleeding and intestinal perforation.  Robin [name] has signed the operative permit.  ENDOSCOPIST:  Verlin Grills, M.D.  PREMEDICATIONS:  Demerol 50 mg, Versed 7.5 mg.  ENDOSCOPE:  Olympus gastroscope.  DESCRIPTION OF PROCEDURE:  After obtaining informed consent, the patient was placed in the left lateral decubitus position.  I administered intravenous Demerol and intravenous Versed to achieve conscious sedation for the procedure.  The patients blood pressure, oxygen saturation, and cardiac rhythm were monitored throughout the procedure and documented in the medical record.  The Olympus gastroscope was passed through the posterior hypopharynx into the proximal esophagus without difficulty.  The hypopharynx, larynx and vocal cords appeared normal.  Esophagoscopy:  The proximal, mid and lower segments of the esophagus appeared completely normal.  The squamocolumnar junction  and the esophagogastric junction are noted at 37 cm from the incisor teeth.  Endoscopic exam of the esophageal mucosa was completely normal.  There is no endoscopic evidence for the presence of erosive esophagitis, esophageal mucosal scarring, esophageal ulceration, or Barretts esophagus.  Gastroscopy:  Endoscopically there are no signs of a hiatal hernia.  Retroflex view of the gastric cardia and fundus was normal.  The gastric body, antrum, and pylorus appeared normal.  Two biopsies were taken from the distal gastric antrum and sent for pathological evaluation for H. pylori antral gastritis.  Duodenoscopy:  The duodenal bulb and descending duodenum appeared completely normal.  ASSESSMENT:  Normal esophagogastroduodenoscopy.  Gastric antrum biopsies are pending to look for signs of Helicobacter pylori antral gastritis.  RECOMMENDATIONS:  If Robin [name] symptoms do not respond to Nexium, I would recommend obtaining a CT scan of the abdomen to rule out pancreatic pathology. If the abdominal CT scan is normal and she is interested in possible laparoscopic antireflux surgery, I would schedule Robin [name] for an esophageal manometry - 24-hour esophageal pH study off Nexium. DD:  10/03/00 TD:  10/03/00 Job: [id number]

## 2010-11-11 ENCOUNTER — Ambulatory Visit (INDEPENDENT_AMBULATORY_CARE_PROVIDER_SITE_OTHER): Payer: Medicare Other | Admitting: Gastroenterology

## 2010-11-11 ENCOUNTER — Encounter: Payer: Self-pay | Admitting: Gastroenterology

## 2010-11-11 DIAGNOSIS — K219 Gastro-esophageal reflux disease without esophagitis: Secondary | ICD-10-CM

## 2010-11-11 DIAGNOSIS — R109 Unspecified abdominal pain: Secondary | ICD-10-CM

## 2010-11-11 MED ORDER — ESOMEPRAZOLE MAGNESIUM 40 MG PO CPDR: 40.0000 mg | DELAYED_RELEASE_CAPSULE | Freq: Every day | ORAL | Status: DC

## 2010-11-11 NOTE — Patient Instructions (Signed)
Your procedure has been scheduled for 11/23/2010, please follow the seperate instructions.  Your prescription(s) have been sent to you pharmacy.  Your abdominal ultrasound is scheduled for 11/15/2010 please arrive at 7:45am for a 8am appt at [facility], have nothing to eat or drink after midnight.    Diet for GERD or PUD Nutrition therapy can help ease the discomfort of gastroesophageal reflux disease (GERD) and peptic ulcer disease (PUD).  HOME CARE INSTRUCTIONS  Eat your meals slowly, in a relaxed setting.   Eat 5 to 6 small meals per day.   If a food causes distress, stop eating it for a period of time.  FOODS TO AVOID:  Coffee, regular or decaffeinated.  Cola beverages, regular or low calorie.   Tea, regular or decaffeinated.   Pepper.   Cocoa.   High fat foods including meats.   Butter, margarine, hydrogenated oil (trans fats).  Peppermint or spearmint (if you have GERD).   Fruits and vegetables as tolerated.   Alcoholic beverages.   Nicotine (smoking or chewing). This is one of the most potent stimulants to acid production in the gastrointestinal tract.   Any food that seems to aggravate your condition.   If you have questions regarding your diet, call your caregiver's office or a registered dietitian. OTHER TIPS IF YOU HAVE GERD:  Lying flat may make symptoms worse. Keep the head of your bed raised 6 to 9 inches by using a foam wedge or blocks under the legs of the bed.   Do not lay down until 3 hours after eating a meal.   Daily physical activity may help reduce symptoms.  MAKE SURE YOU:   Understand these instructions.   Will watch your condition.   Will get help right away if you are not doing well or get worse.  Document Released: 05/01/2005 Document Re-Released: 09/17/2008 [facility] Patient Information 2011 [place], [place].

## 2010-11-11 NOTE — Progress Notes (Signed)
This is a 77 year old Caucasian female who's had recurrent abdominal cramping, dyspepsia, and vague abdominal pain in all quadrants since discontinuing Nexium several months ago. She has not had endoscopy or ultrasound in over 10 years. She has a long chronic history of acid reflux and IBS. She is up-to-date on her colonoscopy exams, denies melena or Medigesic, anorexia or weight loss. She also denies any hepatobiliary complaints at this time.  Current Medications, Allergies, Past Medical History, Past Surgical History, Family History and Social History were reviewed in [employer] record.  Pertinent Review of Systems Negative   Physical Exam: Awake alert no acute distress appears stated age. Chest is clear cardiac exam is unremarkable. I cannot appreciate hepatomegaly, no abdominal masses, tenderness, or abnormal bowel sounds. Mental status is clear.   Assessment and Plan: Probable recurrent gastroesophageal reflux disease. She in the past has been treated for H. pylori infection. I have restarted her Nexium along with the reflux regime, and schedule followup ultrasonography and endoscopy with exams for H. pylori. She is up-to-date on her laboratory parameters. Encounter Diagnoses  Name Primary?  . Abdominal pain Yes  . Esophageal reflux

## 2010-11-15 ENCOUNTER — Ambulatory Visit (HOSPITAL_COMMUNITY)
Admission: RE | Admit: 2010-11-15 | Discharge: 2010-11-15 | Disposition: A | Discharge status: Home / Self Care | Payer: Medicare Other | Source: Ambulatory Visit | Attending: Gastroenterology | Admitting: Gastroenterology

## 2010-11-15 DIAGNOSIS — R109 Unspecified abdominal pain: Secondary | ICD-10-CM | POA: Insufficient documentation

## 2010-11-23 ENCOUNTER — Encounter: Payer: Self-pay | Admitting: Gastroenterology

## 2010-11-23 ENCOUNTER — Ambulatory Visit (AMBULATORY_SURGERY_CENTER): Payer: Medicare Other | Admitting: Gastroenterology

## 2010-11-23 DIAGNOSIS — R109 Unspecified abdominal pain: Secondary | ICD-10-CM

## 2010-11-23 DIAGNOSIS — Z8711 Personal history of peptic ulcer disease: Secondary | ICD-10-CM

## 2010-11-23 DIAGNOSIS — K2940 Chronic atrophic gastritis without bleeding: Secondary | ICD-10-CM

## 2010-11-23 DIAGNOSIS — K3189 Other diseases of stomach and duodenum: Secondary | ICD-10-CM

## 2010-11-23 DIAGNOSIS — K219 Gastro-esophageal reflux disease without esophagitis: Secondary | ICD-10-CM

## 2010-11-23 DIAGNOSIS — K2970 Gastritis, unspecified, without bleeding: Secondary | ICD-10-CM

## 2010-11-23 DIAGNOSIS — K209 Esophagitis, unspecified without bleeding: Secondary | ICD-10-CM

## 2010-11-23 MED ORDER — SODIUM CHLORIDE 0.9 % IV SOLN: 500.0000 mL | INTRAVENOUS | Status: DC

## 2010-11-23 NOTE — Patient Instructions (Signed)
Discharge instructions given with verbal understanding. Handout on gastritis, hiatal hernia and esophagitis given. Resume previous medications.

## 2010-11-24 ENCOUNTER — Telehealth: Payer: Self-pay

## 2010-11-24 DIAGNOSIS — K644 Residual hemorrhoidal skin tags: Secondary | ICD-10-CM

## 2010-11-24 DIAGNOSIS — K2970 Gastritis, unspecified, without bleeding: Secondary | ICD-10-CM

## 2010-11-24 DIAGNOSIS — K2990 Gastroduodenitis, unspecified, without bleeding: Secondary | ICD-10-CM

## 2010-11-24 DIAGNOSIS — R1011 Right upper quadrant pain: Secondary | ICD-10-CM

## 2010-11-24 DIAGNOSIS — R195 Other fecal abnormalities: Secondary | ICD-10-CM

## 2010-11-24 NOTE — Telephone Encounter (Signed)

## 2010-11-29 ENCOUNTER — Encounter: Payer: Self-pay | Admitting: Gastroenterology

## 2010-12-09 ENCOUNTER — Encounter: Payer: Self-pay | Admitting: *Deleted

## 2010-12-13 ENCOUNTER — Ambulatory Visit (INDEPENDENT_AMBULATORY_CARE_PROVIDER_SITE_OTHER): Payer: Medicare Other | Admitting: Gastroenterology

## 2010-12-13 ENCOUNTER — Encounter: Payer: Self-pay | Admitting: Gastroenterology

## 2010-12-13 DIAGNOSIS — K219 Gastro-esophageal reflux disease without esophagitis: Secondary | ICD-10-CM

## 2010-12-13 DIAGNOSIS — K589 Irritable bowel syndrome without diarrhea: Secondary | ICD-10-CM

## 2010-12-13 MED ORDER — ESOMEPRAZOLE MAGNESIUM 40 MG PO CPDR: 40.0000 mg | DELAYED_RELEASE_CAPSULE | Freq: Every day | ORAL | Status: DC

## 2010-12-13 NOTE — Progress Notes (Signed)
History of Present Illness: This is a very pleasant 77 year old Caucasian female with chronic GERD doing well on Nexium 40 mg a day. She has mild chronic constipation alleviated by fiber supplements, liberal by mouth fluids, daily Dulcolax, and when necessary MiraLax. He currently is asymptomatic in terms of gastrointestinal complaints. She has had some mild weight gain is not obese. She is up-to-date on her endoscopy and colonoscopy exams. Her medical problems are refractory hypertension, and she currently is on the therapy of Dr. Kathrene Bongo in nephrology.    Current Medications, Allergies, Past Medical History, Past Surgical History, Family History and Social History were reviewed in [employer] record.   Assessment and plan: Acid reflux well managed with PPI therapy. I have reviewed reflux regime with her, and have encouraged her to continue a high fiber diet with liberal by mouth fluids. She will see [place] on a when necessary basis as needed. Encounter Diagnoses  Name Primary?  . Abdominal pain   . Esophageal reflux

## 2010-12-13 NOTE — Patient Instructions (Signed)
We have printed you a 90 day supply for your Nexium.

## 2011-06-08 DIAGNOSIS — M545 Low back pain, unspecified: Secondary | ICD-10-CM | POA: Diagnosis not present

## 2011-06-08 DIAGNOSIS — R350 Frequency of micturition: Secondary | ICD-10-CM | POA: Diagnosis not present

## 2011-06-08 DIAGNOSIS — Z79899 Other long term (current) drug therapy: Secondary | ICD-10-CM | POA: Diagnosis not present

## 2011-06-08 DIAGNOSIS — G609 Hereditary and idiopathic neuropathy, unspecified: Secondary | ICD-10-CM | POA: Diagnosis not present

## 2011-07-04 DIAGNOSIS — I10 Essential (primary) hypertension: Secondary | ICD-10-CM | POA: Diagnosis not present

## 2011-07-04 DIAGNOSIS — N181 Chronic kidney disease, stage 1: Secondary | ICD-10-CM | POA: Diagnosis not present

## 2011-08-21 ENCOUNTER — Other Ambulatory Visit: Payer: Self-pay | Admitting: Family Medicine

## 2011-08-21 DIAGNOSIS — Z1231 Encounter for screening mammogram for malignant neoplasm of breast: Secondary | ICD-10-CM

## 2011-08-29 ENCOUNTER — Ambulatory Visit (INDEPENDENT_AMBULATORY_CARE_PROVIDER_SITE_OTHER): Payer: Medicare Other | Admitting: Gynecology

## 2011-08-29 ENCOUNTER — Encounter: Payer: Self-pay | Admitting: Gynecology

## 2011-08-29 DIAGNOSIS — Z780 Asymptomatic menopausal state: Secondary | ICD-10-CM

## 2011-08-29 DIAGNOSIS — E559 Vitamin D deficiency, unspecified: Secondary | ICD-10-CM

## 2011-08-29 DIAGNOSIS — E039 Hypothyroidism, unspecified: Secondary | ICD-10-CM

## 2011-08-29 DIAGNOSIS — R635 Abnormal weight gain: Secondary | ICD-10-CM

## 2011-08-29 DIAGNOSIS — M810 Age-related osteoporosis without current pathological fracture: Secondary | ICD-10-CM

## 2011-08-29 DIAGNOSIS — M8580 Other specified disorders of bone density and structure, unspecified site: Secondary | ICD-10-CM | POA: Insufficient documentation

## 2011-08-29 HISTORY — DX: Age-related osteoporosis without current pathological fracture: M81.0

## 2011-08-29 NOTE — Progress Notes (Signed)
Robin [name] 1933-06-18 [id number]   History:    78 y.o.  new patient to the practice who has not been to a gynecologist since 2010. Her primary physician is Dr. Caryn Bee little who is been monitoring her for her hypercholesterolemia and hypertension. She is overdue for mammogram and does her monthly self breast examination. Review of notes from prior provider indicated her last bone density study was in 2007 and her lowest T score was at the left femoral neck with a score of -2.5 (osteoporotic range). It appears that she has been offered treatment for this but has been adamant. Her last colonoscopy was in 2012 which she states was normal (Dr. [name] gastroenterologist). Patient does have a past history of benign colonic polyps. Also review of her records indicated in the past she's had history vitamin D deficiency.  Past medical history,surgical history, family history and social history were all reviewed and documented in the EPIC chart.  Gynecologic History No LMP recorded. Patient is postmenopausal. Contraception: none Last Pap:  2010. Results were: normal Last mammogram:  2012 . Results were: normal  Obstetric History OB History    Grav Para Term Preterm Abortions TAB SAB Ect Mult Living   2 2 2       2      # Outc Date GA Lbr Len/2nd Wgt Sex Del Anes PTL Lv   1 TRM     M SVD  No Yes   2 TRM     F SVD  No Yes       ROS:  Was performed and pertinent positives and negatives are included in the history.  Exam: chaperone present  BP 146/90  Ht 5' 0.75" (1.543 m)  Wt 163 lb (73.936 kg)  BMI 31.05 kg/m2  Body mass index is 31.05 kg/(m^2).  General appearance : Well developed well nourished female. No acute distress HEENT: Neck supple, trachea midline, no carotid bruits, no thyroidmegaly Lungs: Clear to auscultation, no rhonchi or wheezes, or rib retractions  Heart: Regular rate and rhythm, no murmurs or gallops Breast:Examined in sitting and supine position were  symmetrical in appearance, no palpable masses or tenderness,  no skin retraction, no nipple inversion, no nipple discharge, no skin discoloration, no axillary or supraclavicular lymphadenopathy Abdomen: no palpable masses or tenderness, no rebound or guarding Extremities: no edema or skin discoloration or tenderness  Pelvic:  Bartholin, Urethra, Skene Glands: Within normal limits             Vagina: No gross lesions or discharge, atrophic changes   Cervix: No gross lesions or discharge  Uterus  axial, normal size, shape and consistency, non-tender and mobile  Adnexa  Without masses or tenderness  Anus and perineum  normal   Rectovaginal  normal sphincter tone without palpated masses or tenderness             Hemoccult  rectal exam unremarkable. Fecal occult blood testing testing cards provided for her to submit to the office for testing a later date     Assessment/Plan:  78 y.o. female with prior history of osteoporosis has not seen a gynecologist since 2010. It appears that she has been adamant on treatment for osteoporosis. We will schedule for her to have a bone density study here in the office in the next several weeks and and then we can sit down to discuss the results and offer her options. We will check today a vitamin D level because of her past history vitamin D deficiency  and osteoporosis. Of note reviewing her most recent labs which was January 2012 prior to her colonoscopy it appeared her TSH was elevated. She has complaining of some weight gain and temperature instability and for this reason we'll do a thyroid panel today as well. We'll hold off on any other labs since her primary care physician Dr.Little who will l be doing the remainder of her labs at time of her annual exam with him. We discussed the new screening guidelines for r Pap smear screening and since she is over 65 and no prior history of abnormal Pap smears will no longer need them. She was reminded to submit to the office  the fecal occult blood testing cards to the office. She was reminded also to make an appointment to followup with Dr. Clarene Duke.   Ok Edwards MD, 11:40 AM 08/29/2011

## 2011-08-29 NOTE — Patient Instructions (Signed)
Osteoporosis Osteoporosis is a disease of the bones that makes them weaker and prone to break (fracture). By their mid-30s, most people begin to gradually lose bone strength. If this is severe enough, osteoporosis may occur. Osteopenia is a less severe weakness of the bones, which places you at risk for osteoporosis. It is important to identify if you have osteoporosis or osteopenia. Bone fractures from osteoporosis (especially hip and spine fractures) are a major cause of hospitalization, loss of independence, and can lead to life-threatening complications. CAUSES  There are a number of causes and risk factors:  Gender. Women are at a higher risk for osteoporosis than men.   Age. Bone formation slows down with age.   Ethnicity. For unclear reasons, white and Asian women are at higher risk for osteoporosis. Hispanic and African American women are at increased, but lesser, risk.   Family history of osteoporosis can mean that you are at a higher risk for getting it.   History of bone fractures indicates you may be at higher risk of another.   Calcium is very important for bone health and strength. Not enough calcium in your diet increases your risk for osteoporosis. Vitamin D is important for calcium metabolism. You get vitamin D from sunlight, foods, or supplements.   Physical activity. Bones get stronger with weight-bearing exercise and weaker without use.   Smoking is associated with decreased bone strength.   Medicines. Cortisone medicines, too much thyroid medicine, some cancer and seizure medicines, and others can weaken bones and cause osteoporosis.   Decreased body weight is associated with osteoporosis. The small amount of estrogen-type molecules produced in fat cells seems to protect the bones.   Menopausal decrease in the hormone estrogen can cause osteoporosis.   Low levels of the hormone testosterone can cause osteoporosis.   Some medical conditions can lead to osteoporosis  (hyperthyroidism, hyperparathyroidism, B12 deficiency).  SYMPTOMS  Usually, no symptoms are felt as the bones weaken. The first symptoms are generally related to bone fractures. You may have silent, tiny bone fractures, especially in your spine. This can cause height loss and forward bending of the spine (kyphosis). DIAGNOSIS  You or your caregiver may suspect osteoporosis based on height loss and kyphosis. Osteoporosis or osteopenia may be identified on an X-ray done for other reasons. A bone density measurement will likely be taken. Your bones are often measured at your lower spine or your hips. Measurement is done by an X-ray called a DEXA scan, or sometimes by a computerized X-ray scan (CT or CAT scan). Other tests may be done to find the cause of osteoporosis, such as blood tests to measure calcium and vitamin D, or to monitor treatment. TREATMENT  The goal of osteoporosis treatment is to prevent fractures. This is done through medicine and home care treatments. Treatment will slow the weakening of your bones and strengthen them where possible. Measures to decrease the likelihood of falling and fracturing a bone are also important. Medicine  You may need supplements if you are not getting enough calcium, vitamin D, and vitamin B12.   If you are female and menopausal, you should discuss the option of estrogen replacement or estrogen-like medicine with your caregiver.   Medicines can be taken by mouth or injection to help build bone strength. When taken by mouth, there are important directions that you need to follow.   Calcitonin is a hormone made by the thyroid gland that can help build bone strength and decrease fracture risk in the spine. It   can be taken by nasal spray or injection.   Parathyroid hormone can be injected to help build bone strength.   You will need to continue to get enough calcium intake with any of these medicines.  FALL PREVENTION  If you are unsteady on your feet, use  a cane, walker, or walk with someone's help.   Remove loose rugs or electrical cords from your home.   Keep your home well lit at night. Use glasses if you need them.   Avoid icy streets and wet or waxed floors.   Hold the railing when using stairs.   Watch out for your pets.   Install grab bars in your bathroom.   Exercise. Physical activity, especially weight-bearing exercise, helps strengthen bones. Strength and balance exercise, such as tai chi, helps prevent falls.   Alcohol and some medicines can make you more likely to fall. Discuss alcohol use with your caregiver. Ask your caregiver if any of your medicines might increase your risk for falling. Ask if safer alternatives are available.  HOME CARE INSTRUCTIONS   Try to prevent and avoid falls.   To pick up objects, bend at the knees. Do not bend with your back.   Do not smoke. If you smoke, ask for help to stop.   Have adequate calcium and vitamin D in your diet. Talk with your caregiver about amounts.   Before exercising, ask your caregiver what exercises will be good for you.   Only take over-the-counter or prescription medicines for pain, discomfort, or fever as directed by your caregiver.  SEEK MEDICAL CARE IF:   You have had a fracture and your pain is not controlled.   You have had a fracture and you are not able to return to activities as expected.   You are reinjured.   You develop side effects from medicines, especially stomach pain or trouble swallowing.   You develop new, unexplained problems.  SEEK IMMEDIATE MEDICAL CARE IF:   You develop sudden, severe pain in your back.   You develop pain after an injury or fall.  Document Released: 02/08/2005 Document Revised: 04/20/2011 Document Reviewed: 04/15/2011 [facility] Patient Information 2012 [place], [place].  Remember to schedule your mammogram for next month as well as her followup with Dr. little.

## 2011-08-30 LAB — VITAMIN D 25 HYDROXY (VIT D DEFICIENCY, FRACTURES): Vit D, 25-Hydroxy: 40 ng/mL (ref 30–89)

## 2011-08-31 DIAGNOSIS — E782 Mixed hyperlipidemia: Secondary | ICD-10-CM | POA: Diagnosis not present

## 2011-08-31 DIAGNOSIS — Z79899 Other long term (current) drug therapy: Secondary | ICD-10-CM | POA: Diagnosis not present

## 2011-09-05 DIAGNOSIS — Z1211 Encounter for screening for malignant neoplasm of colon: Secondary | ICD-10-CM

## 2011-09-06 ENCOUNTER — Other Ambulatory Visit: Payer: Self-pay | Admitting: *Deleted

## 2011-09-06 ENCOUNTER — Other Ambulatory Visit: Payer: Self-pay | Admitting: Gynecology

## 2011-09-06 DIAGNOSIS — Z1211 Encounter for screening for malignant neoplasm of colon: Secondary | ICD-10-CM

## 2011-09-12 ENCOUNTER — Ambulatory Visit (INDEPENDENT_AMBULATORY_CARE_PROVIDER_SITE_OTHER): Payer: Medicare Other

## 2011-09-12 DIAGNOSIS — M899 Disorder of bone, unspecified: Secondary | ICD-10-CM | POA: Diagnosis not present

## 2011-09-12 DIAGNOSIS — M8580 Other specified disorders of bone density and structure, unspecified site: Secondary | ICD-10-CM

## 2011-09-13 DIAGNOSIS — I10 Essential (primary) hypertension: Secondary | ICD-10-CM | POA: Diagnosis not present

## 2011-10-03 ENCOUNTER — Ambulatory Visit
Admission: RE | Admit: 2011-10-03 | Discharge: 2011-10-03 | Disposition: A | Discharge status: Home / Self Care | Payer: Medicare Other | Source: Ambulatory Visit | Attending: Family Medicine | Admitting: Family Medicine

## 2011-10-03 DIAGNOSIS — Z1231 Encounter for screening mammogram for malignant neoplasm of breast: Secondary | ICD-10-CM

## 2011-10-25 DIAGNOSIS — I10 Essential (primary) hypertension: Secondary | ICD-10-CM | POA: Diagnosis not present

## 2011-10-25 DIAGNOSIS — G609 Hereditary and idiopathic neuropathy, unspecified: Secondary | ICD-10-CM | POA: Diagnosis not present

## 2011-11-15 DIAGNOSIS — R209 Unspecified disturbances of skin sensation: Secondary | ICD-10-CM | POA: Diagnosis not present

## 2011-11-15 DIAGNOSIS — M79609 Pain in unspecified limb: Secondary | ICD-10-CM | POA: Diagnosis not present

## 2011-11-30 DIAGNOSIS — M79609 Pain in unspecified limb: Secondary | ICD-10-CM | POA: Diagnosis not present

## 2011-12-05 DIAGNOSIS — Z79899 Other long term (current) drug therapy: Secondary | ICD-10-CM | POA: Diagnosis not present

## 2011-12-05 DIAGNOSIS — E782 Mixed hyperlipidemia: Secondary | ICD-10-CM | POA: Diagnosis not present

## 2011-12-05 DIAGNOSIS — G609 Hereditary and idiopathic neuropathy, unspecified: Secondary | ICD-10-CM | POA: Diagnosis not present

## 2011-12-05 DIAGNOSIS — I10 Essential (primary) hypertension: Secondary | ICD-10-CM | POA: Diagnosis not present

## 2011-12-05 DIAGNOSIS — R7989 Other specified abnormal findings of blood chemistry: Secondary | ICD-10-CM | POA: Diagnosis not present

## 2011-12-11 DIAGNOSIS — M79609 Pain in unspecified limb: Secondary | ICD-10-CM | POA: Diagnosis not present

## 2011-12-11 DIAGNOSIS — R209 Unspecified disturbances of skin sensation: Secondary | ICD-10-CM | POA: Diagnosis not present

## 2012-01-30 DIAGNOSIS — H02839 Dermatochalasis of unspecified eye, unspecified eyelid: Secondary | ICD-10-CM | POA: Diagnosis not present

## 2012-01-30 DIAGNOSIS — D3130 Benign neoplasm of unspecified choroid: Secondary | ICD-10-CM | POA: Diagnosis not present

## 2012-01-30 DIAGNOSIS — H2510 Age-related nuclear cataract, unspecified eye: Secondary | ICD-10-CM | POA: Diagnosis not present

## 2012-01-30 DIAGNOSIS — H25019 Cortical age-related cataract, unspecified eye: Secondary | ICD-10-CM | POA: Diagnosis not present

## 2012-02-13 DIAGNOSIS — Z23 Encounter for immunization: Secondary | ICD-10-CM | POA: Diagnosis not present

## 2012-02-29 ENCOUNTER — Encounter: Payer: Self-pay | Admitting: Gastroenterology

## 2012-02-29 ENCOUNTER — Other Ambulatory Visit (INDEPENDENT_AMBULATORY_CARE_PROVIDER_SITE_OTHER): Payer: Medicare Other

## 2012-02-29 ENCOUNTER — Ambulatory Visit (INDEPENDENT_AMBULATORY_CARE_PROVIDER_SITE_OTHER): Payer: Medicare Other | Admitting: Gastroenterology

## 2012-02-29 VITALS — BP 168/84 | HR 68 | Ht 60.5 in | Wt 163.4 lb

## 2012-02-29 DIAGNOSIS — R1013 Epigastric pain: Secondary | ICD-10-CM

## 2012-02-29 DIAGNOSIS — R799 Abnormal finding of blood chemistry, unspecified: Secondary | ICD-10-CM

## 2012-02-29 DIAGNOSIS — K219 Gastro-esophageal reflux disease without esophagitis: Secondary | ICD-10-CM

## 2012-02-29 DIAGNOSIS — R7989 Other specified abnormal findings of blood chemistry: Secondary | ICD-10-CM

## 2012-02-29 DIAGNOSIS — K5901 Slow transit constipation: Secondary | ICD-10-CM | POA: Diagnosis not present

## 2012-02-29 LAB — FOLATE: Folate: >24.8 ng/mL (ref >5.9)

## 2012-02-29 LAB — CBC WITH DIFFERENTIAL/PLATELET
Basophils Relative: 0.8 % (ref 0.0–3.0)
Eosinophils Absolute: 0.2 10*3/uL (ref 0.0–0.7)
Eosinophils Relative: 4 % (ref 0.0–5.0)
HCT: 40 % (ref 36.0–46.0)
Lymphs Abs: 1.2 10*3/uL (ref 0.7–4.0)
MCHC: 33.3 g/dL (ref 30.0–36.0)
MCV: 93.8 fl (ref 78.0–100.0)
Monocytes Absolute: 0.4 10*3/uL (ref 0.1–1.0)
Neutro Abs: 3 10*3/uL (ref 1.4–7.7)
RBC: 4.27 Mil/uL (ref 3.87–5.11)
WBC: 4.9 10*3/uL (ref 4.5–10.5)

## 2012-02-29 LAB — VITAMIN B12: Vitamin B-12: 306 pg/mL (ref 211–911)

## 2012-02-29 LAB — BASIC METABOLIC PANEL
GFR: 89.37 mL/min (ref >60.00)
Potassium: 4.1 mEq/L (ref 3.5–5.1)
Sodium: 139 mEq/L (ref 135–145)

## 2012-02-29 LAB — HEPATIC FUNCTION PANEL
ALT: 15 U/L (ref 0–35)
AST: 20 U/L (ref 0–37)
Bilirubin, Direct: 0.1 mg/dL (ref 0.0–0.3)
Total Bilirubin: 0.7 mg/dL (ref 0.3–1.2)

## 2012-02-29 LAB — IBC PANEL: Saturation Ratios: 36.6 % (ref 20.0–50.0)

## 2012-02-29 LAB — FERRITIN: Ferritin: 22.8 ng/mL (ref 10.0–291.0)

## 2012-02-29 MED ORDER — ESOMEPRAZOLE MAGNESIUM 40 MG PO CPDR: 40.0000 mg | DELAYED_RELEASE_CAPSULE | Freq: Two times a day (BID) | ORAL | Status: DC

## 2012-02-29 MED ORDER — HYDROCORTISONE ACE-PRAMOXINE 1-1 % RE CREA: TOPICAL_CREAM | Freq: Two times a day (BID) | RECTAL | Status: DC

## 2012-02-29 MED ORDER — LINACLOTIDE 290 MCG PO CAPS: 1.0000 | ORAL_CAPSULE | Freq: Every day | ORAL | Status: DC

## 2012-02-29 NOTE — Progress Notes (Signed)
This is a very nice 78 year old Caucasian female who has chronic GERD and chronic IBS. She had been doing extremely well on daily PPI therapy until recent changes in her medications that included Verapramil the call severe constipation. She took this medicine for approximate month and then stopped because of continued gas, bloating, abdominal discomfort, and also worsening of her acid reflux symptoms and subxiphoid discomfort. This patient's had endoscopy and colonoscopy and upper nominal ultrasound within the last 2 years. She denies fever, chills, nausea vomiting, systemic or hepatobiliary complaints. Patient also denies abuse of alcohol, cigarettes, or NSAIDs. Associated with her new-onset constipation is been some discomfort from her" hemorrhoids". She is tried fiber supplements, MiraLax, and stool softeners with only minimal improvement.  Current Medications, Allergies, Past Medical History, Past Surgical History, Family History and Social History were reviewed in [employer] record.  Pertinent Review of Systems Negative   Physical Exam: Healthy-appearing patient in no acute distress. Blood pressure 160/84, pulse 68, weight 163 pounds with a BMI of 31.38. I cannot appreciate stigmata of chronic liver disease. Her chest is clear and she is in a regular rhythm without murmurs gallops or rubs. Her abdomen shows no organomegaly, masses, or significant tenderness. Bowel sounds are normal, and her abdomen is not distended. Peripheral extremities unremarkable and mental status is normal.    Assessment and Plan: Drug induced constipation in a patient with chronic IBS. This also is exacerbated her chronic GERD symptomatology. I have asked continue a high fiber diet, fiber supplements, her current laxatives, and have added Linzess 290 mcg a day to her regime, also increased her Nexium to 40 mg twice a day. Lab screenings also ordered for review. She was see me back in 2 weeks' time, but  is to call if she has worsening of her complaints. Encounter Diagnoses  Name Primary?  . Abdominal pain Yes  . General symptom    . Abnormal blood chemistry

## 2012-02-29 NOTE — Patient Instructions (Addendum)
Your physician has requested that you go to the basement for  lab work before leaving today: We have sent the following medications to your pharmacy for you to pick up at your convenience: Nexium, Analpram  Nexium should be increased to twice daily. Medication Samples have been provided to the patient. Samples of Linzess were given to you today, please take 1 daily for constipation. Continue Miralax every night before bed. Follow up with Dr Jarold Motto in 2 weeks. CC:  Catha Gosselin MD

## 2012-03-19 ENCOUNTER — Encounter: Payer: Self-pay | Admitting: Gastroenterology

## 2012-03-19 ENCOUNTER — Ambulatory Visit (INDEPENDENT_AMBULATORY_CARE_PROVIDER_SITE_OTHER): Payer: Medicare Other | Admitting: Gastroenterology

## 2012-03-19 VITALS — BP 160/82 | HR 60 | Ht 60.5 in | Wt 162.5 lb

## 2012-03-19 DIAGNOSIS — K5901 Slow transit constipation: Secondary | ICD-10-CM

## 2012-03-19 DIAGNOSIS — Z8719 Personal history of other diseases of the digestive system: Secondary | ICD-10-CM | POA: Diagnosis not present

## 2012-03-19 MED ORDER — LINACLOTIDE 290 MCG PO CAPS: 1.0000 | ORAL_CAPSULE | Freq: Every day | ORAL | Status: DC

## 2012-03-19 NOTE — Progress Notes (Signed)
History of Present Illness: This is a 78 year old Caucasian female who is doing much better with her constipation on Linzess 290 Micrograms a day. She also denies any acid reflux symptoms on Nexium 40 mg daily. As per my previous note, she has drug induced constipation, and she recently changed blood pressure medications. Review of all of her labs from October 17 are unremarkable including CBC, anemia profile and metabolic profile.    Current Medications, Allergies, Past Medical History, Past Surgical History, Family History and Social History were reviewed in [employer] record.   Assessment and plan: Continue current medications with when necessary MiraLax use. We'll see her on when necessary basis the future as needed. No diagnosis found.

## 2012-03-19 NOTE — Patient Instructions (Addendum)
We have sent the following medications to your pharmacy: Linzess CC: Dr Catha Gosselin

## 2012-03-29 ENCOUNTER — Ambulatory Visit: Payer: Medicare Other | Admitting: Internal Medicine

## 2012-05-20 ENCOUNTER — Telehealth: Payer: Self-pay | Admitting: Gastroenterology

## 2012-05-20 NOTE — Telephone Encounter (Signed)
error 

## 2012-06-11 ENCOUNTER — Ambulatory Visit (INDEPENDENT_AMBULATORY_CARE_PROVIDER_SITE_OTHER): Payer: Medicare Other | Admitting: Family Medicine

## 2012-06-11 ENCOUNTER — Encounter: Payer: Self-pay | Admitting: Family Medicine

## 2012-06-11 VITALS — BP 201/100 | HR 96 | Temp 98.3°F | Ht 61.5 in | Wt 165.0 lb

## 2012-06-11 DIAGNOSIS — L0292 Furuncle, unspecified: Secondary | ICD-10-CM

## 2012-06-11 DIAGNOSIS — Z7189 Other specified counseling: Secondary | ICD-10-CM

## 2012-06-11 DIAGNOSIS — I10 Essential (primary) hypertension: Secondary | ICD-10-CM | POA: Diagnosis not present

## 2012-06-11 DIAGNOSIS — K589 Irritable bowel syndrome without diarrhea: Secondary | ICD-10-CM

## 2012-06-11 DIAGNOSIS — R002 Palpitations: Secondary | ICD-10-CM

## 2012-06-11 DIAGNOSIS — E7800 Pure hypercholesterolemia, unspecified: Secondary | ICD-10-CM

## 2012-06-11 DIAGNOSIS — Z7689 Persons encountering health services in other specified circumstances: Secondary | ICD-10-CM

## 2012-06-11 HISTORY — DX: Irritable bowel syndrome, unspecified: K58.9

## 2012-06-11 MED ORDER — CHLORTHALIDONE 25 MG PO TABS: 12.5000 mg | ORAL_TABLET | Freq: Every day | ORAL | Status: DC

## 2012-06-11 MED ORDER — MUPIROCIN 2 % EX OINT: TOPICAL_OINTMENT | Freq: Three times a day (TID) | CUTANEOUS | Status: DC

## 2012-06-11 MED ORDER — ROSUVASTATIN CALCIUM 10 MG PO TABS: 5.0000 mg | ORAL_TABLET | Freq: Every day | ORAL | Status: DC

## 2012-06-11 MED ORDER — LISINOPRIL 5 MG PO TABS: 5.0000 mg | ORAL_TABLET | Freq: Every day | ORAL | Status: DC

## 2012-06-11 MED ORDER — DOXYCYCLINE HYCLATE 100 MG PO TABS: 100.0000 mg | ORAL_TABLET | Freq: Two times a day (BID) | ORAL | Status: DC

## 2012-06-11 NOTE — Patient Instructions (Addendum)
-  We have ordered labs or studies at this visit. It can take up to 1-2 weeks for results and processing. We will contact you with instructions IF your results are abnormal. Normal results will be released to your [facility]. If you have not heard from [place] or can not find your results in [facility] in 2 weeks please contact our office.  -PLEASE SIGN UP FOR MYCHART TODAY   -We placed a referral for you as discussed to the cardiologist. It usually takes about 1-2 weeks to process and schedule this referral. If you have not heard from [place] regarding this appointment in 2 weeks please contact our office.  -PLEASE CONTINUE THE HYDRALAZINE at the low dose you are taking  -PLEASE START the low doses of Chlorthalidone and prinivil  We recommend the following healthy lifestyle measures: - eat a healthy diet consisting of lots of vegetables, fruits, beans, nuts, seeds, healthy meats such as white chicken and fish and whole grains.  - avoid fried foods, fast food, processed foods, sodas, red meet and other fattening foods.  - get a least 150 minutes of aerobic exercise per week.   -for the bump on your nose - take the antibiotic and apply the ointment as instructed - follow up in 1 week  Follow up in: 1 week for an early morning appointment and will check fasting labs then

## 2012-06-11 NOTE — Progress Notes (Addendum)
Chief Complaint  Patient presents with  . Establish Care    HPI:  Robin [name] is here to establish care.  Used to see Dr. Clarene Duke, but per her report discharged from the practice due to her inability to take the medications prescribed for her blood pressure. She provided a list of over 20 BP medications, including all classes, which she has not been able to tolerate or she felt did not help her BP. Hydralazine was the most recently prescribed medication and she reports it made her heart race so she refused to take more then 1/2 tablet twice daily. Of note she has had heart racing at times when does not take it. She wants to see a cardiologist for this. Denies: CP, SOB, dizziness now, HA, vision changes. Meds she did not tolerate at all due to CP, dizziness or racing heart: HCTZ, Diovan, Losartan, Lisinopril, Varapamil, Coreg, metoprolol, spironolactone, felodipine, tekturna, atacand, indapamide, cardizem, lasix, hydralazine.  She understands that her elevated BP puts her at risk for stroke and heart attack.  Reports did tolerate prinivil, benicar, bystolic - but stopped each because it wasn't working. Reviewed labs in chart from 02/2012 and looked ok.  Bump under nose: -started 3 days ago -painful - putting neosporin on it, drained  Has the following chronic problems and concerns today:  Patient Active Problem List  Diagnosis  . HYPERCHOLESTEROLEMIA  . HYPERTENSION  . GERD (gastroesophageal reflux disease)  . Osteoporosis  . Vitamin d deficiency  . IBS (irritable bowel syndrome), GERD, hx colon polyps, chronic abd pain - followed by Dr. Jarold Motto in GI   Health Maintenance: -cant remember any of her vaccine dates - did get the flu vaccine this year, had pneumo, had tetanus but can't remember when -had a mammo gram last year ROS: See pertinent positives and negatives per HPI.   Past Medical History  Diagnosis Date  . Personal history of colonic polyps 05/10/2009   tubulovillous adenoma  . Personal history of peptic ulcer disease   . Pure hypercholesterolemia   . Unspecified essential hypertension   . External hemorrhoids without mention of complication   . Nonspecific abnormal finding in stool contents   . Constipation   . GERD (gastroesophageal reflux disease)   . Gastritis   . Hiatal hernia   . Hypertension   . ADENOMATOUS COLONIC POLYP 09/09/2007    Qualifier: Diagnosis of  By: Davonna Belling    . DYSPEPSIA, CHRONIC 04/13/2009    Qualifier: Diagnosis of  By: Jarold Motto MD Mervyn Skeeters R   . CONSTIPATION 06/07/2010    Qualifier: Diagnosis of  By: Koleen Distance CMA Duncan Dull), Hulan Saas      Family History  Problem Relation Age of Onset  . Colon cancer Neg Hx   . Hypertension Father   . Diabetes Sister   . Hypertension Sister   . Diabetes Brother   . Hypertension Brother     History   Social History  . Marital Status: Widowed    Spouse Name: N/A    Number of Children: N/A  . Years of Education: N/A   Occupational History  . retired    Social History Main Topics  . Smoking status: Never Smoker   . Smokeless tobacco: Never Used  . Alcohol Use: No  . Drug Use: No  . Sexually Active: None   Other Topics Concern  . None   Social History Narrative  . None    Current outpatient prescriptions:Alum Hydroxide-Mag Carbonate (GAVISCON PO), Take by mouth.  As needed , Disp: , Rfl: ;  Bioflavonoid Products (ESTER-C) 500-550 MG TABS, Take 1 capsule by mouth daily.  , Disp: , Rfl: ;  Bisacodyl (DULCOLAX PO), Take by mouth. As needed , Disp: , Rfl: ;  esomeprazole (NEXIUM) 40 MG capsule, Take 1 capsule (40 mg total) by mouth 2 (two) times daily., Disp: 180 capsule, Rfl: 3 ezetimibe (ZETIA) 10 MG tablet, Take 10 mg by mouth daily.  , Disp: , Rfl: ;  folic acid (FOLVITE) 1 MG tablet, Take 1 mg by mouth daily.  , Disp: , Rfl: ;  hydrALAZINE (APRESOLINE) 10 MG tablet, Take 5 mg by mouth 2 (two) times daily. , Disp: , Rfl: ;  Multiple  Minerals-Vitamins (CITRACAL PLUS PO), Take 1 capsule by mouth daily.  , Disp: , Rfl: ;  Psyllium 48.57 % POWD, Benefiber Takes daily, Disp: , Rfl:  rosuvastatin (CRESTOR) 10 MG tablet, Take 0.5 tablets (5 mg total) by mouth daily. 1/2 tablet, Disp: 90 tablet, Rfl: 3;  chlorthalidone (HYGROTON) 25 MG tablet, Take 0.5 tablets (12.5 mg total) by mouth daily., Disp: 45 tablet, Rfl: 0;  doxycycline (VIBRA-TABS) 100 MG tablet, Take 1 tablet (100 mg total) by mouth 2 (two) times daily., Disp: 20 tablet, Rfl: 0 Linaclotide (LINZESS) 290 MCG CAPS, Take 1 capsule by mouth daily., Disp: 90 capsule, Rfl: 1;  lisinopril (PRINIVIL,ZESTRIL) 5 MG tablet, Take 1 tablet (5 mg total) by mouth daily., Disp: 90 tablet, Rfl: 0;  mupirocin ointment (BACTROBAN) 2 %, Apply topically 3 (three) times daily., Disp: 22 g, Rfl: 0;  pramoxine-hydrocortisone (PROCTOCREAM-HC) 1-1 % rectal cream, Place rectally 2 (two) times daily., Disp: 90 g, Rfl: 3  EXAM:  Filed Vitals:   06/11/12 1249  BP: 201/100  Pulse: 96  Temp: 98.3 F (36.8 C)    Body mass index is 30.67 kg/(m^2).  GENERAL: vitals reviewed and listed above, alert, oriented, appears well hydrated and in no acute distress  HEENT: atraumatic, conjunttiva clear, no obvious abnormalities on inspection of external nose and ears  NECK: no obvious masses on inspection  LUNGS: clear to auscultation bilaterally, no wheezes, rales or rhonchi, good air movement  CV: HRRR, no peripheral edema  MS: moves all extremities without noticeable abnormality  SKIN: small boil on face at opening of R nostril with mild small amount of surrounding erythema  PSYCH: pleasant and cooperative, no obvious depression or anxiety  ASSESSMENT AND PLAN:  Discussed the following assessment and plan:  1. HYPERTENSION  chlorthalidone (HYGROTON) 25 MG tablet, lisinopril (PRINIVIL,ZESTRIL) 5 MG tablet  2. IBS (irritable bowel syndrome), GERD, hx colon polyps, chronic abd pain - followed by Dr.  Jarold Motto in GI    3. HYPERCHOLESTEROLEMIA    4. Encounter to establish care    5. Boil  doxycycline (VIBRA-TABS) 100 MG tablet, mupirocin ointment (BACTROBAN) 2 %  6. Palpitations  Ambulatory referral to Cardiology   -We reviewed the PMH, PSH, FH, SH, Meds and Allergies. -We provided refills for any medications we will prescribe as needed. -We addressed current concerns per orders and patient instructions. -explained to pt that it is unlikely that she has truly had an intolerance to all of these medications. She wants to see Dr. Excell Seltzer in Cards for her HTN and palpitaitons and referral placed. I advised we start prinivil (which she said she tolerated) and chlorthalidone (never tried) at low doses then titrate up. I advised she likely may need 2-3 or more medications given degree of HTN. If we are not able to  get her BP under control will refer her to nephrology. Suspect anxiety as reason for so many medication intolerances - but pt does not think this is the case. -will check fasting labs at follow up for boil on face next week. -will advise nurse to get vaccine records from previous doctor  -Patient advised to return or notify a doctor immediately if symptoms worsen or persist or new concerns arise.  Patient Instructions  -We have ordered labs or studies at this visit. It can take up to 1-2 weeks for results and processing. We will contact you with instructions IF your results are abnormal. Normal results will be released to your [facility] If you have not heard from [place] or can not find your results in [facility] in 2 weeks please contact our office.  -PLEASE SIGN UP FOR MYCHART TODAY   -We placed a referral for you as discussed to the cardiologist. It usually takes about 1-2 weeks to process and schedule this referral. If you have not heard from [place] regarding this appointment in 2 weeks please contact our office.  -PLEASE CONTINUE THE HYDRALAZINE at the low dose you are taking  -PLEASE START the  low doses of Chlorthalidone and prinivil  We recommend the following healthy lifestyle measures: - eat a healthy diet consisting of lots of vegetables, fruits, beans, nuts, seeds, healthy meats such as white chicken and fish and whole grains.  - avoid fried foods, fast food, processed foods, sodas, red meet and other fattening foods.  - get a least 150 minutes of aerobic exercise per week.   -for the bump on your nose - take the antibiotic and apply the ointment as instructed - follow up in 1 week  Follow up in: 1 week for an early morning appointment and will check fasting labs then      Kriste Basque R.

## 2012-06-11 NOTE — Addendum Note (Signed)
Addended by: Terressa Koyanagi on: 06/11/2012 01:48 PM   Modules accepted: Orders

## 2012-06-19 ENCOUNTER — Encounter: Payer: Self-pay | Admitting: Family Medicine

## 2012-06-19 ENCOUNTER — Telehealth: Payer: Self-pay | Admitting: Family Medicine

## 2012-06-19 ENCOUNTER — Ambulatory Visit (INDEPENDENT_AMBULATORY_CARE_PROVIDER_SITE_OTHER): Payer: Medicare Other | Admitting: Family Medicine

## 2012-06-19 VITALS — BP 170/94 | HR 88 | Temp 99.0°F | Wt 164.0 lb

## 2012-06-19 DIAGNOSIS — R7309 Other abnormal glucose: Secondary | ICD-10-CM

## 2012-06-19 DIAGNOSIS — E7800 Pure hypercholesterolemia, unspecified: Secondary | ICD-10-CM

## 2012-06-19 DIAGNOSIS — I10 Essential (primary) hypertension: Secondary | ICD-10-CM | POA: Diagnosis not present

## 2012-06-19 DIAGNOSIS — R739 Hyperglycemia, unspecified: Secondary | ICD-10-CM

## 2012-06-19 LAB — BASIC METABOLIC PANEL
BUN: 15 mg/dL (ref 6–23)
GFR: 89.3 mL/min (ref >60.00)
Potassium: 3.8 mEq/L (ref 3.5–5.1)
Sodium: 139 mEq/L (ref 135–145)

## 2012-06-19 LAB — HEMOGLOBIN A1C: Hgb A1c MFr Bld: 5.4 % (ref 4.6–6.5)

## 2012-06-19 LAB — LIPID PANEL
LDL Cholesterol: 92 mg/dL (ref 0–99)
VLDL: 15.6 mg/dL (ref 0.0–40.0)

## 2012-06-19 MED ORDER — LISINOPRIL 10 MG PO TABS: 10.0000 mg | ORAL_TABLET | Freq: Every day | ORAL | Status: DC

## 2012-06-19 MED ORDER — ROSUVASTATIN CALCIUM 5 MG PO TABS: 2.5000 mg | ORAL_TABLET | Freq: Every day | ORAL | Status: DC

## 2012-06-19 NOTE — Telephone Encounter (Signed)
Labs look good.

## 2012-06-19 NOTE — Patient Instructions (Addendum)
-  increased prinvil to 10mg  daily  -We recommend the following healthy lifestyle measures: - eat a healthy diet consisting of lots of vegetables, fruits, beans, nuts, seeds, healthy meats such as white chicken and fish and whole grains.  - avoid fried foods, fast food, processed foods, sodas, red meet and other fattening foods.  - get a least 150 minutes of aerobic exercise per week.   -follow up in 1 month - bring cuff and log to next visit

## 2012-06-19 NOTE — Progress Notes (Signed)
Chief Complaint  Patient presents with  . Follow-up    bloodwork     HPI:  Follow up:  Boil on face: -had drained last visit -treated with doxy, mupirocin last visit -doing much better - almost completely resolved  HTN: -pt intolerant per her report to over 30 different BP meds - discharged from prior physician due to her refusal to take her BP meds -on hydralazine 1/2 10 mg tablet bid, and started low dose lisinopril (prinvil) 5mg   and chlorthalidone 25mg  last visit 1 week ago -reports has been checking BP at home daily and has been in 140-170s -feels ok on these meds -has not changed diet and not exercising - reports she is lazy -denies: CP, SOB, HAs  HLD: -Getting fasting labs today -on crestor 2.5 mg - 1/2 of 5 mg tablet  ROS: See pertinent positives and negatives per HPI.  Past Medical History  Diagnosis Date  . Personal history of colonic polyps 05/10/2009    tubulovillous adenoma  . Personal history of peptic ulcer disease   . Pure hypercholesterolemia   . Unspecified essential hypertension   . External hemorrhoids without mention of complication   . Nonspecific abnormal finding in stool contents   . Constipation   . GERD (gastroesophageal reflux disease)   . Gastritis   . Hiatal hernia   . Hypertension   . ADENOMATOUS COLONIC POLYP 09/09/2007    Qualifier: Diagnosis of  By: Davonna Belling    . DYSPEPSIA, CHRONIC 04/13/2009    Qualifier: Diagnosis of  By: Jarold Motto MD Mervyn Skeeters R   . CONSTIPATION 06/07/2010    Qualifier: Diagnosis of  By: Koleen Distance CMA Duncan Dull), Hulan Saas      Family History  Problem Relation Age of Onset  . Colon cancer Neg Hx   . Hypertension Father   . Diabetes Sister   . Hypertension Sister   . Diabetes Brother   . Hypertension Brother     History   Social History  . Marital Status: Widowed    Spouse Name: N/A    Number of Children: N/A  . Years of Education: N/A   Occupational History  . retired    Social History  Main Topics  . Smoking status: Never Smoker   . Smokeless tobacco: Never Used  . Alcohol Use: No  . Drug Use: No  . Sexually Active: None   Other Topics Concern  . None   Social History Narrative  . None    Current outpatient prescriptions:Alum Hydroxide-Mag Carbonate (GAVISCON PO), Take by mouth. As needed , Disp: , Rfl: ;  Bioflavonoid Products (ESTER-C) 500-550 MG TABS, Take 1 capsule by mouth daily.  , Disp: , Rfl: ;  Bisacodyl (DULCOLAX PO), Take by mouth. As needed , Disp: , Rfl: ;  chlorthalidone (HYGROTON) 25 MG tablet, Take 0.5 tablets (12.5 mg total) by mouth daily., Disp: 45 tablet, Rfl: 0 doxycycline (VIBRA-TABS) 100 MG tablet, Take 1 tablet (100 mg total) by mouth 2 (two) times daily., Disp: 20 tablet, Rfl: 0;  esomeprazole (NEXIUM) 40 MG capsule, Take 1 capsule (40 mg total) by mouth 2 (two) times daily., Disp: 180 capsule, Rfl: 3;  ezetimibe (ZETIA) 10 MG tablet, Take 10 mg by mouth daily.  , Disp: , Rfl: ;  folic acid (FOLVITE) 1 MG tablet, Take 1 mg by mouth daily.  , Disp: , Rfl:  hydrALAZINE (APRESOLINE) 10 MG tablet, Take 5 mg by mouth 2 (two) times daily. , Disp: , Rfl: ;  Linaclotide (  LINZESS) 290 MCG CAPS, Take 1 capsule by mouth daily., Disp: 90 capsule, Rfl: 1;  Multiple Minerals-Vitamins (CITRACAL PLUS PO), Take 1 capsule by mouth daily.  , Disp: , Rfl: ;  mupirocin ointment (BACTROBAN) 2 %, Apply topically 3 (three) times daily., Disp: 22 g, Rfl: 0 pramoxine-hydrocortisone (PROCTOCREAM-HC) 1-1 % rectal cream, Place rectally 2 (two) times daily., Disp: 90 g, Rfl: 3;  Psyllium 48.57 % POWD, Benefiber Takes daily, Disp: , Rfl: ;  lisinopril (PRINIVIL) 10 MG tablet, Take 1 tablet (10 mg total) by mouth daily., Disp: 30 tablet, Rfl: 1;  rosuvastatin (CRESTOR) 5 MG tablet, Take 0.5 tablets (2.5 mg total) by mouth daily., Disp: 45 tablet, Rfl: 3  EXAM:  Filed Vitals:   06/19/12 0849  BP: 170/94  Pulse: 88  Temp: 99 F (37.2 C)    There is no height on file to  calculate BMI.  GENERAL: vitals reviewed and listed above, alert, oriented, appears well hydrated and in no acute distress  HEENT: atraumatic, conjunttiva clear, no obvious abnormalities on inspection of external nose and ears  NECK: no obvious masses on inspection  LUNGS: clear to auscultation bilaterally, no wheezes, rales or rhonchi, good air movement  CV: HRRR, no peripheral edema  MS: moves all extremities without noticeable abnormality  PSYCH: pleasant and cooperative, no obvious depression or anxiety  ASSESSMENT AND PLAN:  Discussed the following assessment and plan:  1. HYPERTENSION  lisinopril (PRINIVIL) 10 MG tablet, Basic metabolic panel, DISCONTINUED: lisinopril (PRINIVIL) 10 MG tablet  2. HYPERCHOLESTEROLEMIA  Lipid Panel, rosuvastatin (CRESTOR) 5 MG tablet  3. Hyperglycemia  Hemoglobin A1c   -FASTING LABS -advised lifestyle changes - increased exercise and improve diet -changed crestor dose per her request - reports she CAN NOT tolerate more then 2.5 mg daily. Advised if cholesterol too high will try different statin. -increase prinvil to 10mg  - going in baby steps due to pts fear of BP meds -advised if can not get her BP under control will refer to nephrologist -has appointment with cardiologist next week -follow up in 1 month -Patient advised to return or notify a doctor immediately if symptoms worsen or persist or new concerns arise.  Patient Instructions  -increased prinvil to 10mg  daily  -We recommend the following healthy lifestyle measures: - eat a healthy diet consisting of lots of vegetables, fruits, beans, nuts, seeds, healthy meats such as white chicken and fish and whole grains.  - avoid fried foods, fast food, processed foods, sodas, red meet and other fattening foods.  - get a least 150 minutes of aerobic exercise per week.   -follow up in 1 month - bring cuff and log to next visit     KIM, HANNAH R.

## 2012-06-20 ENCOUNTER — Telehealth: Payer: Self-pay

## 2012-06-20 NOTE — Telephone Encounter (Signed)
Pt states when she left the office on 06/19/12 she was given a written prescription for prinivil and was told to take it to her pharmacy.  Pt states when she got to the pharmacy she was told the rx was already filled.  Pt states she was confused.  Confirmed in pt's chart that rx for prinivil was printed at pt's appt and given to patient.  Pt states when she was given the prescription in the pharmacy it was for lisinopril and she gave it back to the pharmacist. Pt states the pharmacy told her that she could pick up the rx for prinvil today and that her insurance would pay for it.  Advised pt to call back and let me know if rx was not received.

## 2012-06-25 ENCOUNTER — Encounter: Payer: Self-pay | Admitting: Cardiology

## 2012-06-25 ENCOUNTER — Ambulatory Visit (INDEPENDENT_AMBULATORY_CARE_PROVIDER_SITE_OTHER): Payer: Medicare Other | Admitting: Cardiology

## 2012-06-25 VITALS — BP 160/98 | HR 88 | Wt 162.0 lb

## 2012-06-25 DIAGNOSIS — I10 Essential (primary) hypertension: Secondary | ICD-10-CM

## 2012-06-25 DIAGNOSIS — R079 Chest pain, unspecified: Secondary | ICD-10-CM

## 2012-06-25 DIAGNOSIS — E7800 Pure hypercholesterolemia, unspecified: Secondary | ICD-10-CM | POA: Diagnosis not present

## 2012-06-25 DIAGNOSIS — R002 Palpitations: Secondary | ICD-10-CM | POA: Diagnosis not present

## 2012-06-25 MED ORDER — LISINOPRIL 20 MG PO TABS: 20.0000 mg | ORAL_TABLET | Freq: Every day | ORAL | Status: DC

## 2012-06-25 NOTE — Assessment & Plan Note (Signed)
Continue statin. 

## 2012-06-25 NOTE — Assessment & Plan Note (Signed)
Blood pressure is elevated but she states it has been better at home recently. She is on minimal dose hydralazine. I will discontinue this medication. Increase lisinopril to 20 mg daily. Continue diuretic. Check potassium and renal function in one week. She will continue to monitor her blood pressure at home and she will keep records and bring with the next visit. She will also bring her cuff to correlate with ours. She will continue with lifestyle modification.

## 2012-06-25 NOTE — Assessment & Plan Note (Signed)
Symptoms atypical. Schedule stress echocardiogram.

## 2012-06-25 NOTE — Patient Instructions (Addendum)
Your physician recommends that you schedule a follow-up appointment in: 6 WEEKS WITH DR CRENSHAW  INCREASE LISINOPRIL TO 20 MG ONCE DAILY  STOP HYDRALAZINE  Your physician recommends that you return for lab work in: ONE WEEK  Your physician has requested that you have a stress echocardiogram. For further information please visit [email or web address] Please follow instruction sheet as given.

## 2012-06-25 NOTE — Progress Notes (Signed)
HPI: 79 yo female for evaluation of hypertension. Echocardiogram in July of 2010 showed an ejection fraction of 65-70% and grade 1 diastolic dysfunction. Patient has had hypertension for years. She apparently has had problems tolerating medications. Because of her blood pressure we were asked to evaluate. She denies dyspnea on exertion, orthopnea, PND, pedal edema, palpitations, syncope. She does occasionally have chest pain anomaly at night. It is substernal and described a dull sensation. It does not radiate. It is not exertional, pleuritic or positional. She goes to sleep and it resolves. She does not have exertional chest pain.  Current Outpatient Prescriptions  Medication Sig Dispense Refill  . Alum Hydroxide-Mag Carbonate (GAVISCON PO) Take by mouth. As needed       . Bioflavonoid Products (ESTER-C) 500-550 MG TABS Take 1 capsule by mouth daily.        . Bisacodyl (DULCOLAX PO) Take by mouth. As needed       . chlorthalidone (HYGROTON) 25 MG tablet Take 0.5 tablets (12.5 mg total) by mouth daily.  45 tablet  0  . esomeprazole (NEXIUM) 40 MG capsule Take 1 capsule (40 mg total) by mouth 2 (two) times daily.  180 capsule  3  . ezetimibe (ZETIA) 10 MG tablet Take 10 mg by mouth daily.        . folic acid (FOLVITE) 1 MG tablet Take 1 mg by mouth daily.        . Linaclotide (LINZESS) 290 MCG CAPS Take 1 capsule by mouth daily.  90 capsule  1  . lisinopril (PRINIVIL,ZESTRIL) 20 MG tablet Take 1 tablet (20 mg total) by mouth daily.  30 tablet  12  . Multiple Minerals-Vitamins (CITRACAL PLUS PO) Take 1 capsule by mouth daily.        . mupirocin ointment (BACTROBAN) 2 % Apply topically 3 (three) times daily.  22 g  0  . pramoxine-hydrocortisone (PROCTOCREAM-HC) 1-1 % rectal cream Place rectally 2 (two) times daily.  90 g  3  . Psyllium 48.57 % POWD Benefiber Takes daily      . rosuvastatin (CRESTOR) 5 MG tablet Take 0.5 tablets (2.5 mg total) by mouth daily.  45 tablet  3   No current  facility-administered medications for this visit.    Allergies  Allergen Reactions  . Amoxicillin     REACTION: rash  . Metronidazole     REACTION: rash    Past Medical History  Diagnosis Date  . Personal history of colonic polyps 05/10/2009    tubulovillous adenoma  . Personal history of peptic ulcer disease   . Pure hypercholesterolemia   . Unspecified essential hypertension   . External hemorrhoids without mention of complication   . GERD (gastroesophageal reflux disease)   . Gastritis   . Hiatal hernia   . Hypertension   . ADENOMATOUS COLONIC POLYP 09/09/2007    Qualifier: Diagnosis of  By: Davonna Belling    . DYSPEPSIA, CHRONIC 04/13/2009    Qualifier: Diagnosis of  By: Jarold Motto MD Lang Snow     Past Surgical History  Procedure Laterality Date  . Back surgery  1974  . Carpal tunnel release  2/10  . Tonsillectomy      History   Social History  . Marital Status: Widowed    Spouse Name: N/A    Number of Children: 2  . Years of Education: N/A   Occupational History  . retired   .     Social History Main Topics  . Smoking  status: Never Smoker   . Smokeless tobacco: Never Used  . Alcohol Use: No  . Drug Use: No  . Sexually Active: Not on file   Other Topics Concern  . Not on file   Social History Narrative  . No narrative on file    Family History  Problem Relation Age of Onset  . Colon cancer Neg Hx   . Hypertension Father   . Diabetes Sister   . Hypertension Sister   . Diabetes Brother   . Hypertension Brother   . CAD Mother     Died of MI at age 78  . CAD Brother     Died of MI at age 51  . Stroke Father     ROS: no fevers or chills, productive cough, hemoptysis, dysphasia, odynophagia, melena, hematochezia, dysuria, hematuria, rash, seizure activity, orthopnea, PND, pedal edema, claudication. Remaining systems are negative.  Physical Exam:   Blood pressure 160/98, pulse 88, weight 162 lb (73.483 kg).  General:  Well  developed/well nourished in NAD Skin warm/dry Patient not depressed No peripheral clubbing Back-normal HEENT-normal/normal eyelids Neck supple/normal carotid upstroke bilaterally; no bruits; no JVD; no thyromegaly chest - CTA/ normal expansion CV - RRR/normal S1 and S2; no murmurs, rubs or gallops;  PMI nondisplaced Abdomen -NT/ND, no HSM, no mass, + bowel sounds, no bruit 2+ femoral pulses, no bruits Ext-no edema, chords, 2+ DP Neuro-grossly nonfocal  ECG sinus rhythm with occasional PVCs. Left axis deviation. Left ventricular hypertrophy. No ST changes.

## 2012-07-02 ENCOUNTER — Other Ambulatory Visit: Payer: Self-pay | Admitting: *Deleted

## 2012-07-02 ENCOUNTER — Other Ambulatory Visit (INDEPENDENT_AMBULATORY_CARE_PROVIDER_SITE_OTHER): Payer: Medicare Other

## 2012-07-02 DIAGNOSIS — I10 Essential (primary) hypertension: Secondary | ICD-10-CM

## 2012-07-02 LAB — BASIC METABOLIC PANEL
Calcium: 10.2 mg/dL (ref 8.4–10.5)
GFR: 80.99 mL/min (ref >60.00)
Glucose, Bld: 91 mg/dL (ref 70–99)
Sodium: 140 mEq/L (ref 135–145)

## 2012-07-03 ENCOUNTER — Telehealth: Payer: Self-pay | Admitting: Cardiology

## 2012-07-03 NOTE — Telephone Encounter (Signed)
New Problem:    Patient called in returning your call from yesterday.  Please call back

## 2012-07-03 NOTE — Telephone Encounter (Signed)
Spoke with pt, aware of normal labs 

## 2012-07-09 ENCOUNTER — Ambulatory Visit (HOSPITAL_COMMUNITY): Payer: Medicare Other | Attending: Cardiology | Admitting: Radiology

## 2012-07-09 ENCOUNTER — Encounter: Payer: Self-pay | Admitting: Cardiology

## 2012-07-09 ENCOUNTER — Ambulatory Visit (HOSPITAL_COMMUNITY): Payer: Medicare Other | Attending: Cardiology

## 2012-07-09 DIAGNOSIS — R002 Palpitations: Secondary | ICD-10-CM | POA: Diagnosis not present

## 2012-07-09 DIAGNOSIS — E7800 Pure hypercholesterolemia, unspecified: Secondary | ICD-10-CM | POA: Diagnosis not present

## 2012-07-09 DIAGNOSIS — I10 Essential (primary) hypertension: Secondary | ICD-10-CM | POA: Insufficient documentation

## 2012-07-09 DIAGNOSIS — R0989 Other specified symptoms and signs involving the circulatory and respiratory systems: Secondary | ICD-10-CM

## 2012-07-09 DIAGNOSIS — R079 Chest pain, unspecified: Secondary | ICD-10-CM | POA: Diagnosis not present

## 2012-07-09 NOTE — Progress Notes (Signed)
Echocardiogram performed.  

## 2012-07-23 ENCOUNTER — Encounter: Payer: Self-pay | Admitting: Family Medicine

## 2012-07-23 ENCOUNTER — Telehealth: Payer: Self-pay

## 2012-07-23 ENCOUNTER — Ambulatory Visit (INDEPENDENT_AMBULATORY_CARE_PROVIDER_SITE_OTHER): Payer: Medicare Other | Admitting: Family Medicine

## 2012-07-23 VITALS — BP 148/78 | HR 100 | Temp 99.4°F | Wt 166.0 lb

## 2012-07-23 DIAGNOSIS — I10 Essential (primary) hypertension: Secondary | ICD-10-CM

## 2012-07-23 DIAGNOSIS — Z289 Immunization not carried out for unspecified reason: Secondary | ICD-10-CM

## 2012-07-23 DIAGNOSIS — E7800 Pure hypercholesterolemia, unspecified: Secondary | ICD-10-CM

## 2012-07-23 DIAGNOSIS — L0292 Furuncle, unspecified: Secondary | ICD-10-CM | POA: Diagnosis not present

## 2012-07-23 DIAGNOSIS — Z23 Encounter for immunization: Secondary | ICD-10-CM

## 2012-07-23 NOTE — Telephone Encounter (Signed)
Called [facility] and spoke with Toniann Fail who states that pt had a crestor 10 mg and crestor 5 mg in her profile.  Advised that pt should be taking Crestor 5 mg- take 1/2 tablet daily.  Rx changed and pt picked up rx today.

## 2012-07-23 NOTE — Progress Notes (Signed)
Chief Complaint  Patient presents with  . 1 MONTH follow up    HPI:  Follow up HTN: -pt intolerant to many meds and discharged from previous PCP due to refusal to treat her BP -referred to cards for her complaint of frequent racing heart/palpitations - but appears when she saw cards she did not report this complaint and they focused on her BP -advised by cards to take  Lisinopril 20mg  and continues chlorthalidone -reports initially had trouble with the lisinopril but now tolerating well -she brought log and has been 130-140s/60-80s -reports: feeling much better, reports heart palpitations resolved -not doing any exercise and not eating healthy  HLD: -on extremely small dose of statin -doing well -report rx at pharmacy is wrong - 10mg  and she can not tolerate this  Pimple on face: -using mupirocin and improving  Health Maintenance: -wants tdap and penumovax today  ROS: See pertinent positives and negatives per HPI.  Past Medical History  Diagnosis Date  . Personal history of colonic polyps 05/10/2009    tubulovillous adenoma  . Personal history of peptic ulcer disease   . Pure hypercholesterolemia   . Unspecified essential hypertension   . External hemorrhoids without mention of complication   . GERD (gastroesophageal reflux disease)   . Gastritis   . Hiatal hernia   . Hypertension   . ADENOMATOUS COLONIC POLYP 09/09/2007    Qualifier: Diagnosis of  By: Davonna Belling    . DYSPEPSIA, CHRONIC 04/13/2009    Qualifier: Diagnosis of  By: Jarold Motto MD Lang Snow     Family History  Problem Relation Age of Onset  . Colon cancer Neg Hx   . Hypertension Father   . Diabetes Sister   . Hypertension Sister   . Diabetes Brother   . Hypertension Brother   . CAD Mother     Died of MI at age 29  . CAD Brother     Died of MI at age 43  . Stroke Father     History   Social History  . Marital Status: Widowed    Spouse Name: N/A    Number of Children: 2  .  Years of Education: N/A   Occupational History  . retired   .     Social History Main Topics  . Smoking status: Never Smoker   . Smokeless tobacco: Never Used  . Alcohol Use: No  . Drug Use: No  . Sexually Active: None   Other Topics Concern  . None   Social History Narrative  . None    Current outpatient prescriptions:Alum Hydroxide-Mag Carbonate (GAVISCON PO), Take by mouth. As needed , Disp: , Rfl: ;  Bioflavonoid Products (ESTER-C) 500-550 MG TABS, Take 1 capsule by mouth daily.  , Disp: , Rfl: ;  Bisacodyl (DULCOLAX PO), Take by mouth. As needed , Disp: , Rfl: ;  chlorthalidone (HYGROTON) 25 MG tablet, Take 0.5 tablets (12.5 mg total) by mouth daily., Disp: 45 tablet, Rfl: 0 esomeprazole (NEXIUM) 40 MG capsule, Take 1 capsule (40 mg total) by mouth 2 (two) times daily., Disp: 180 capsule, Rfl: 3;  ezetimibe (ZETIA) 10 MG tablet, Take 10 mg by mouth daily.  , Disp: , Rfl: ;  folic acid (FOLVITE) 1 MG tablet, Take 1 mg by mouth daily.  , Disp: , Rfl: ;  Linaclotide (LINZESS) 290 MCG CAPS, Take 1 capsule by mouth daily., Disp: 90 capsule, Rfl: 1 lisinopril (PRINIVIL,ZESTRIL) 20 MG tablet, Take 1 tablet (20 mg total) by  mouth daily., Disp: 30 tablet, Rfl: 12;  Multiple Minerals-Vitamins (CITRACAL PLUS PO), Take 1 capsule by mouth daily.  , Disp: , Rfl: ;  mupirocin ointment (BACTROBAN) 2 %, Apply topically 3 (three) times daily., Disp: 22 g, Rfl: 0;  pramoxine-hydrocortisone (PROCTOCREAM-HC) 1-1 % rectal cream, Place rectally 2 (two) times daily., Disp: 90 g, Rfl: 3 Psyllium 48.57 % POWD, Benefiber Takes daily, Disp: , Rfl: ;  rosuvastatin (CRESTOR) 5 MG tablet, Take 0.5 tablets (2.5 mg total) by mouth daily., Disp: 45 tablet, Rfl: 3  EXAM:  Filed Vitals:   07/23/12 0942  BP: 148/78  Pulse: 100  Temp: 99.4 F (37.4 C)    Body mass index is 30.86 kg/(m^2).  GENERAL: vitals reviewed and listed above, alert, oriented, appears well hydrated and in no acute distress  HEENT:  atraumatic, conjunttiva clear, no obvious abnormalities on inspection of external nose and ears  NECK: no obvious masses on inspection  LUNGS: clear to auscultation bilaterally, no wheezes, rales or rhonchi, good air movement  CV: HRRR, no peripheral edema  MS: moves all extremities without noticeable abnormality  SKIN: very small healing lesion under L nostril  PSYCH: pleasant and cooperative, no obvious depression or anxiety  ASSESSMENT AND PLAN:  Discussed the following assessment and plan:  HYPERCHOLESTEROLEMIA  HYPERTENSION  Missed vaccination  Boil   -will continue current BP medication - advised lifestyle changes -ensured pt desired dose of crestor at pharmacy - discussed such a small dose may be able to stop - but she prefers to continued -vaccines today: pneumo, tdap - discussed risks, wants shingles vaccine next visit -follow up 3 months -Patient advised to return or notify a doctor immediately if symptoms worsen or persist or new concerns arise.  There are no Patient Instructions on file for this visit.   Kriste Basque R.

## 2012-07-23 NOTE — Addendum Note (Signed)
Addended by: Azucena Freed on: 07/23/2012 10:51 AM   Modules accepted: Orders

## 2012-07-23 NOTE — Patient Instructions (Signed)
We recommend the following healthy lifestyle measures: - eat a healthy diet consisting of lots of vegetables, fruits, beans, nuts, seeds, healthy meats such as white chicken and fish and whole grains.  - avoid fried foods, fast food, processed foods, sodas, red meet and other fattening foods.  - get a least 150 minutes of aerobic exercise per week.    Continue current medications  Follow up in 3 months

## 2012-07-24 ENCOUNTER — Other Ambulatory Visit: Payer: Self-pay

## 2012-07-24 DIAGNOSIS — I10 Essential (primary) hypertension: Secondary | ICD-10-CM

## 2012-07-24 MED ORDER — CHLORTHALIDONE 25 MG PO TABS: 12.5000 mg | ORAL_TABLET | Freq: Every day | ORAL | Status: DC

## 2012-07-24 NOTE — Telephone Encounter (Signed)
Pt called request rx for cholorthadone. Send to [facility].  Rx sent.

## 2012-07-26 ENCOUNTER — Telehealth: Payer: Self-pay | Admitting: Family Medicine

## 2012-07-26 DIAGNOSIS — E7800 Pure hypercholesterolemia, unspecified: Secondary | ICD-10-CM

## 2012-07-26 MED ORDER — ROSUVASTATIN CALCIUM 5 MG PO TABS: ORAL_TABLET | ORAL | Status: DC

## 2012-07-26 NOTE — Telephone Encounter (Signed)
Attempted to call pt, no answer, left message on voicemail

## 2012-07-26 NOTE — Telephone Encounter (Signed)
Pt called back and states she needed crestor called in to [facility].  Rx sent.

## 2012-08-13 ENCOUNTER — Ambulatory Visit (INDEPENDENT_AMBULATORY_CARE_PROVIDER_SITE_OTHER): Payer: Medicare Other | Admitting: Cardiology

## 2012-08-13 ENCOUNTER — Encounter: Payer: Self-pay | Admitting: Cardiology

## 2012-08-13 VITALS — BP 170/90 | HR 92 | Ht 62.0 in | Wt 165.6 lb

## 2012-08-13 DIAGNOSIS — I10 Essential (primary) hypertension: Secondary | ICD-10-CM | POA: Diagnosis not present

## 2012-08-13 MED ORDER — LISINOPRIL 20 MG PO TABS: 20.0000 mg | ORAL_TABLET | Freq: Every day | ORAL | Status: DC

## 2012-08-13 NOTE — Assessment & Plan Note (Signed)
Management per primary care. 

## 2012-08-13 NOTE — Progress Notes (Signed)
HPI: 79 yo female I initially saw in Feb 2014 for evaluation of hypertension. Echocardiogram in July of 2010 showed an ejection fraction of 65-70% and grade 1 diastolic dysfunction. Stress echocardiogram in February of 2014 was normal. Patient has had hypertension for years. She apparently has had problems tolerating medications. When I saw her previously we discontinued her hydralazine and increased her lisinopril. Since then, the patient denies any dyspnea on exertion, orthopnea, PND, pedal edema, palpitations, syncope or chest pain.    Current Outpatient Prescriptions  Medication Sig Dispense Refill  . Alum Hydroxide-Mag Carbonate (GAVISCON PO) Take by mouth. As needed       . Bioflavonoid Products (ESTER-C) 500-550 MG TABS Take 1 capsule by mouth daily.        . Bisacodyl (DULCOLAX PO) Take by mouth. As needed       . chlorthalidone (HYGROTON) 25 MG tablet Take 0.5 tablets (12.5 mg total) by mouth daily.  135 tablet  1  . esomeprazole (NEXIUM) 40 MG capsule Take 1 capsule (40 mg total) by mouth 2 (two) times daily.  180 capsule  3  . ezetimibe (ZETIA) 10 MG tablet Take 10 mg by mouth daily.        . folic acid (FOLVITE) 1 MG tablet Take 1 mg by mouth daily.        . hydrALAZINE (APRESOLINE) 10 MG tablet       . Linaclotide 290 MCG CAPS Take 1 capsule by mouth as needed.      Marland Kitchen lisinopril (PRINIVIL,ZESTRIL) 20 MG tablet Take 1 tablet (20 mg total) by mouth daily.  30 tablet  12  . Multiple Minerals-Vitamins (CITRACAL PLUS PO) Take 1 capsule by mouth daily.        . mupirocin ointment (BACTROBAN) 2 % Apply topically as needed.      . pramoxine-hydrocortisone (PROCTOCREAM-HC) 1-1 % rectal cream Place rectally as needed.      . Psyllium 48.57 % POWD Benefiber Takes daily      . rosuvastatin (CRESTOR) 5 MG tablet Take 1/2 tablet by mouth daily.  135 tablet  1   No current facility-administered medications for this visit.     Past Medical History  Diagnosis Date  . Personal history of  colonic polyps 05/10/2009    tubulovillous adenoma  . Personal history of peptic ulcer disease   . Pure hypercholesterolemia   . Unspecified essential hypertension   . External hemorrhoids without mention of complication   . GERD (gastroesophageal reflux disease)   . Gastritis   . Hiatal hernia   . Hypertension   . ADENOMATOUS COLONIC POLYP 09/09/2007    Qualifier: Diagnosis of  By: Davonna Belling    . DYSPEPSIA, CHRONIC 04/13/2009    Qualifier: Diagnosis of  By: Jarold Motto MD Lang Snow     Past Surgical History  Procedure Laterality Date  . Back surgery  1974  . Carpal tunnel release  2/10  . Tonsillectomy      History   Social History  . Marital Status: Widowed    Spouse Name: N/A    Number of Children: 2  . Years of Education: N/A   Occupational History  . retired   .     Social History Main Topics  . Smoking status: Never Smoker   . Smokeless tobacco: Never Used  . Alcohol Use: No  . Drug Use: No  . Sexually Active: Not on file   Other Topics Concern  . Not on file  Social History Narrative  . No narrative on file    ROS: no fevers or chills, productive cough, hemoptysis, dysphasia, odynophagia, melena, hematochezia, dysuria, hematuria, rash, seizure activity, orthopnea, PND, pedal edema, claudication. Remaining systems are negative.  Physical Exam: Well-developed well-nourished in no acute distress.  Skin is warm and dry.  HEENT is normal.  Neck is supple.  Chest is clear to auscultation with normal expansion.  Cardiovascular exam is regular rate and rhythm.  Abdominal exam nontender or distended. No masses palpated. Extremities show no edema. neuro grossly intact

## 2012-08-13 NOTE — Patient Instructions (Addendum)
Your physician recommends that you schedule a follow-up appointment in: AS NEEDED  

## 2012-08-13 NOTE — Assessment & Plan Note (Signed)
Blood pressure is mildly elevated today. However she checks this routinely at home and it has been controlled on her lisinopril HCT. We will continue that dose. This can be increased in the future if needed.

## 2012-08-13 NOTE — Assessment & Plan Note (Signed)
No further chest pain.stress echo normal. No plans for further evaluation.

## 2012-09-02 ENCOUNTER — Other Ambulatory Visit: Payer: Self-pay

## 2012-09-02 DIAGNOSIS — Z1231 Encounter for screening mammogram for malignant neoplasm of breast: Secondary | ICD-10-CM

## 2012-10-15 ENCOUNTER — Ambulatory Visit
Admission: RE | Admit: 2012-10-15 | Discharge: 2012-10-15 | Disposition: A | Discharge status: Home / Self Care | Payer: Medicare Other | Source: Ambulatory Visit

## 2012-10-15 DIAGNOSIS — Z1231 Encounter for screening mammogram for malignant neoplasm of breast: Secondary | ICD-10-CM

## 2012-10-16 ENCOUNTER — Other Ambulatory Visit: Payer: Self-pay | Admitting: Family Medicine

## 2012-10-16 DIAGNOSIS — R928 Other abnormal and inconclusive findings on diagnostic imaging of breast: Secondary | ICD-10-CM

## 2012-11-05 ENCOUNTER — Ambulatory Visit
Admission: RE | Admit: 2012-11-05 | Discharge: 2012-11-05 | Disposition: A | Discharge status: Home / Self Care | Payer: Medicare Other | Source: Ambulatory Visit | Attending: Family Medicine | Admitting: Family Medicine

## 2012-11-05 DIAGNOSIS — R928 Other abnormal and inconclusive findings on diagnostic imaging of breast: Secondary | ICD-10-CM

## 2012-11-25 ENCOUNTER — Telehealth: Payer: Self-pay | Admitting: Family Medicine

## 2012-11-25 NOTE — Telephone Encounter (Signed)
Pt needs new RX for ezetimibe (ZETIA) 10 MG tablet (1/day)  sent to [facility]  90 day supply

## 2012-11-25 NOTE — Telephone Encounter (Signed)
Pls advise.  Per last note:   HLD:  -on extremely small dose of statin  -doing well  -report rx at pharmacy is wrong - 10mg  and she can not tolerate this

## 2012-11-25 NOTE — Telephone Encounter (Signed)
Please figure out what rx she is taking for her cholesterol and send refill. Remove any medications she is not taking from her list and notify her pharmacy.

## 2012-11-26 MED ORDER — EZETIMIBE 10 MG PO TABS: 10.0000 mg | ORAL_TABLET | Freq: Every day | ORAL | Status: DC

## 2012-11-26 NOTE — Telephone Encounter (Signed)
Called and spoke with pt and pt states she takes 1/2 of Crestor and a 10 mg Zetia. Rx for Zetia sent to pharmacy.

## 2012-12-02 ENCOUNTER — Telehealth: Payer: Self-pay | Admitting: Family Medicine

## 2012-12-02 NOTE — Telephone Encounter (Signed)
Advise appt to examine - not possible to dx over the phone.

## 2012-12-02 NOTE — Telephone Encounter (Signed)
Called and spoke with pt and pt has questions about problem on her "bottom".  Pt states it is on the outside of her vagina and she started feeling irritation.  Pt states she felt a little nodule that moves. Pt denies vaginal discharge- pt had been using vagsil on the area because she thought her pants may have rubbed against her legs.  Pls advise if you would see pt for this or if pt needs to be referred to gynecologist.

## 2012-12-02 NOTE — Telephone Encounter (Signed)
Left a message for pt to call office to make appt for 7/22.

## 2012-12-02 NOTE — Telephone Encounter (Signed)
Pt requested to speak with you.  

## 2012-12-03 ENCOUNTER — Ambulatory Visit (INDEPENDENT_AMBULATORY_CARE_PROVIDER_SITE_OTHER): Payer: Medicare Other | Admitting: Family Medicine

## 2012-12-03 ENCOUNTER — Telehealth: Payer: Self-pay | Admitting: Family Medicine

## 2012-12-03 ENCOUNTER — Encounter: Payer: Self-pay | Admitting: Family Medicine

## 2012-12-03 VITALS — BP 160/100 | Temp 98.7°F | Wt 166.0 lb

## 2012-12-03 DIAGNOSIS — B373 Candidiasis of vulva and vagina: Secondary | ICD-10-CM

## 2012-12-03 DIAGNOSIS — B3731 Acute candidiasis of vulva and vagina: Secondary | ICD-10-CM | POA: Diagnosis not present

## 2012-12-03 MED ORDER — FLUCONAZOLE 150 MG PO TABS: 150.0000 mg | ORAL_TABLET | Freq: Once | ORAL | Status: DC

## 2012-12-03 NOTE — Telephone Encounter (Signed)
Pt saw Dr Selena Batten today. Was given rx for DIFLUCAN tablet. Pt states before she left here, she was told it was sent to pharmacy. She has been to [facility] 2x, and there is no rx. Per chart, looks like it was sent mail order. Please cancel mail order request and send to [facility]. Because pt has already been there 2x, she would like someone her to call her and let her know it is at [facility]. Thanks.

## 2012-12-03 NOTE — Telephone Encounter (Signed)
Rx sent to [facility].

## 2012-12-03 NOTE — Progress Notes (Signed)
Chief Complaint  Patient presents with  . vulvovaginal irritation    HPI:  Acute visit: -started about 1 week ago -started with a little irritation and pruritis of vulva, then felt like had a small cyst on vulva mildly tender that then resolved -denies: current pain, vaginal discharge, urinary symptoms, fevers -tried OTC vaginal products  ROS: See pertinent positives and negatives per HPI.  Past Medical History  Diagnosis Date  . Personal history of colonic polyps 05/10/2009    tubulovillous adenoma  . Personal history of peptic ulcer disease   . Pure hypercholesterolemia   . Unspecified essential hypertension   . External hemorrhoids without mention of complication   . GERD (gastroesophageal reflux disease)   . Gastritis   . Hiatal hernia   . Hypertension   . ADENOMATOUS COLONIC POLYP 09/09/2007    Qualifier: Diagnosis of  By: Davonna Belling    . DYSPEPSIA, CHRONIC 04/13/2009    Qualifier: Diagnosis of  By: Jarold Motto MD Lang Snow     Family History  Problem Relation Age of Onset  . Colon cancer Neg Hx   . Hypertension Father   . Diabetes Sister   . Hypertension Sister   . Diabetes Brother   . Hypertension Brother   . CAD Mother     Died of MI at age 50  . CAD Brother     Died of MI at age 24  . Stroke Father     History   Social History  . Marital Status: Widowed    Spouse Name: N/A    Number of Children: 2  . Years of Education: N/A   Occupational History  . retired   .     Social History Main Topics  . Smoking status: Never Smoker   . Smokeless tobacco: Never Used  . Alcohol Use: No  . Drug Use: No  . Sexually Active: None   Other Topics Concern  . None   Social History Narrative  . None    Current outpatient prescriptions:Alum Hydroxide-Mag Carbonate (GAVISCON PO), Take by mouth. As needed , Disp: , Rfl: ;  Bioflavonoid Products (ESTER-C) 500-550 MG TABS, Take 1 capsule by mouth daily.  , Disp: , Rfl: ;  Bisacodyl (DULCOLAX  PO), Take by mouth. As needed , Disp: , Rfl: ;  chlorthalidone (HYGROTON) 25 MG tablet, Take 0.5 tablets (12.5 mg total) by mouth daily., Disp: 135 tablet, Rfl: 1 esomeprazole (NEXIUM) 40 MG capsule, Take 1 capsule (40 mg total) by mouth 2 (two) times daily., Disp: 180 capsule, Rfl: 3;  ezetimibe (ZETIA) 10 MG tablet, Take 1 tablet (10 mg total) by mouth daily., Disp: 90 tablet, Rfl: 1;  folic acid (FOLVITE) 1 MG tablet, Take 1 mg by mouth daily.  , Disp: , Rfl: ;  hydrALAZINE (APRESOLINE) 10 MG tablet, , Disp: , Rfl: ;  Linaclotide 290 MCG CAPS, Take 1 capsule by mouth as needed., Disp: , Rfl:  lisinopril (PRINIVIL,ZESTRIL) 20 MG tablet, Take 1 tablet (20 mg total) by mouth daily., Disp: 90 tablet, Rfl: 4;  Multiple Minerals-Vitamins (CITRACAL PLUS PO), Take 1 capsule by mouth daily.  , Disp: , Rfl: ;  mupirocin ointment (BACTROBAN) 2 %, Apply topically as needed., Disp: , Rfl: ;  pramoxine-hydrocortisone (PROCTOCREAM-HC) 1-1 % rectal cream, Place rectally as needed., Disp: , Rfl:  Psyllium 48.57 % POWD, Benefiber Takes daily, Disp: , Rfl: ;  rosuvastatin (CRESTOR) 5 MG tablet, Take 1/2 tablet by mouth daily., Disp: 135 tablet, Rfl: 1;  fluconazole (DIFLUCAN) 150 MG tablet, Take 1 tablet (150 mg total) by mouth once., Disp: 1 tablet, Rfl: 1  EXAM:  Filed Vitals:   12/03/12 0917  BP: 160/100  Temp: 98.7 F (37.1 C)    Body mass index is 30.35 kg/(m^2).  GENERAL: vitals reviewed and listed above, alert, oriented, appears well hydrated and in no acute distress  HEENT: atraumatic, conjunttiva clear, no obvious abnormalities on inspection of external nose and ears  NECK: no obvious masses on inspection  GU: mild vulvar erythema/irritation c/w with yeast, no swelling, LAD, cyst or abscesses  MS: moves all extremities without noticeable abnormality  PSYCH: pleasant and cooperative, no obvious depression or anxiety  ASSESSMENT AND PLAN:  Discussed the following assessment and  plan:  Vulvovaginal candidiasis - Plan: fluconazole (DIFLUCAN) 150 MG tablet  -likely yeast, OTC products didn't help - diflucan once - risks discussed -no cyst or swelling appreciated to follow up if returns or persists -follow up/return precautions -Patient advised to return or notify a doctor immediately if symptoms worsen or persist or new concerns arise.  There are no Patient Instructions on file for this visit.   Kriste Basque R.

## 2013-01-14 ENCOUNTER — Ambulatory Visit (INDEPENDENT_AMBULATORY_CARE_PROVIDER_SITE_OTHER): Payer: Medicare Other | Admitting: Family Medicine

## 2013-01-14 ENCOUNTER — Encounter: Payer: Self-pay | Admitting: Family Medicine

## 2013-01-14 VITALS — BP 136/82 | HR 98 | Temp 98.5°F | Wt 163.0 lb

## 2013-01-14 DIAGNOSIS — J069 Acute upper respiratory infection, unspecified: Secondary | ICD-10-CM | POA: Diagnosis not present

## 2013-01-14 DIAGNOSIS — R300 Dysuria: Secondary | ICD-10-CM | POA: Diagnosis not present

## 2013-01-14 DIAGNOSIS — I10 Essential (primary) hypertension: Secondary | ICD-10-CM | POA: Diagnosis not present

## 2013-01-14 DIAGNOSIS — R110 Nausea: Secondary | ICD-10-CM | POA: Diagnosis not present

## 2013-01-14 LAB — POCT URINALYSIS DIPSTICK
Ketones, UA: 2
Protein, UA: NEGATIVE
Urobilinogen, UA: 0.2

## 2013-01-14 MED ORDER — ALBUTEROL SULFATE HFA 108 (90 BASE) MCG/ACT IN AERS: 2.0000 | INHALATION_SPRAY | Freq: Four times a day (QID) | RESPIRATORY_TRACT | Status: DC | PRN

## 2013-01-14 MED ORDER — ONDANSETRON HCL 4 MG PO TABS: 4.0000 mg | ORAL_TABLET | Freq: Three times a day (TID) | ORAL | Status: DC | PRN

## 2013-01-14 MED ORDER — DOXYCYCLINE HYCLATE 100 MG PO TABS: 100.0000 mg | ORAL_TABLET | Freq: Two times a day (BID) | ORAL | Status: DC

## 2013-01-14 NOTE — Patient Instructions (Addendum)
-  We have ordered labs or studies at this visit. It can take up to 1-2 weeks for results and processing. We will contact you with instructions IF your results are abnormal. Normal results will be released to your [facility]. If you have not heard from [place] or can not find your results in [facility] in 2 weeks please contact our office.  Plenty of fluids  If still not feeling well in a few days start antibiotic  Albuterol if needed for wheezing  Follow up as needed or in 2 weeks if symptoms persist and for vaccines

## 2013-01-14 NOTE — Progress Notes (Addendum)
Chief Complaint  Patient presents with  . Back Pain    lower    HPI:  Acute visit for back pain: -started 2 weeks ago -low back bilat -nothing makes it better or worse -denies: fevers, weakness or numbness, urinary or bowel incontinence  UTI/URI: -started about 1-2 weeks ago -symptoms: urinary frequency, nasal congestion, runny nose, sneezing, malaise, sweating yesterday, nausea, cough, wheezing a little -denies: SOB, NVD, bowel changes, blood in urine, fevers -she reports hx of UTI and feeling like this and wants to check urine  HTN: -fine on recheck -denies: CP, SOB, swelling, palpitations -wants to check kidneys on BP medications  Health maintenance: -shingles vaccine refused -flu vaccine offered today, but she wants to hold of on this  OS: See pertinent positives and negatives per HPI.  Past Medical History  Diagnosis Date  . Personal history of colonic polyps 05/10/2009    tubulovillous adenoma  . Personal history of peptic ulcer disease   . Pure hypercholesterolemia   . Unspecified essential hypertension   . External hemorrhoids without mention of complication   . GERD (gastroesophageal reflux disease)   . Gastritis   . Hiatal hernia   . Hypertension   . ADENOMATOUS COLONIC POLYP 09/09/2007    Qualifier: Diagnosis of  By: Davonna Belling    . DYSPEPSIA, CHRONIC 04/13/2009    Qualifier: Diagnosis of  By: Jarold Motto MD Lang Snow     Past Surgical History  Procedure Laterality Date  . Back surgery  1974  . Carpal tunnel release  2/10  . Tonsillectomy      Family History  Problem Relation Age of Onset  . Colon cancer Neg Hx   . Hypertension Father   . Diabetes Sister   . Hypertension Sister   . Diabetes Brother   . Hypertension Brother   . CAD Mother     Died of MI at age 42  . CAD Brother     Died of MI at age 49  . Stroke Father     History   Social History  . Marital Status: Widowed    Spouse Name: N/A    Number of Children:  2  . Years of Education: N/A   Occupational History  . retired   .     Social History Main Topics  . Smoking status: Never Smoker   . Smokeless tobacco: Never Used  . Alcohol Use: No  . Drug Use: No  . Sexual Activity: None   Other Topics Concern  . None   Social History Narrative  . None    Current outpatient prescriptions:Alum Hydroxide-Mag Carbonate (GAVISCON PO), Take by mouth. As needed , Disp: , Rfl: ;  Bioflavonoid Products (ESTER-C) 500-550 MG TABS, Take 1 capsule by mouth daily.  , Disp: , Rfl: ;  Bisacodyl (DULCOLAX PO), Take by mouth. As needed , Disp: , Rfl: ;  chlorthalidone (HYGROTON) 25 MG tablet, Take 0.5 tablets (12.5 mg total) by mouth daily., Disp: 135 tablet, Rfl: 1 esomeprazole (NEXIUM) 40 MG capsule, Take 1 capsule (40 mg total) by mouth 2 (two) times daily., Disp: 180 capsule, Rfl: 3;  ezetimibe (ZETIA) 10 MG tablet, Take 1 tablet (10 mg total) by mouth daily., Disp: 90 tablet, Rfl: 1;  folic acid (FOLVITE) 1 MG tablet, Take 1 mg by mouth daily.  , Disp: , Rfl: ;  hydrALAZINE (APRESOLINE) 10 MG tablet, , Disp: , Rfl: ;  Linaclotide 290 MCG CAPS, Take 1 capsule by mouth as needed.,  Disp: , Rfl:  lisinopril (PRINIVIL,ZESTRIL) 20 MG tablet, Take 1 tablet (20 mg total) by mouth daily., Disp: 90 tablet, Rfl: 4;  Multiple Minerals-Vitamins (CITRACAL PLUS PO), Take 1 capsule by mouth daily.  , Disp: , Rfl: ;  mupirocin ointment (BACTROBAN) 2 %, Apply topically as needed., Disp: , Rfl: ;  pramoxine-hydrocortisone (PROCTOCREAM-HC) 1-1 % rectal cream, Place rectally as needed., Disp: , Rfl:  Psyllium 48.57 % POWD, Benefiber Takes daily, Disp: , Rfl: ;  rosuvastatin (CRESTOR) 5 MG tablet, Take 1/2 tablet by mouth daily., Disp: 135 tablet, Rfl: 1;  albuterol (PROVENTIL HFA;VENTOLIN HFA) 108 (90 BASE) MCG/ACT inhaler, Inhale 2 puffs into the lungs every 6 (six) hours as needed for wheezing., Disp: 1 Inhaler, Rfl: 0;  doxycycline (VIBRA-TABS) 100 MG tablet, Take 1 tablet (100 mg  total) by mouth 2 (two) times daily., Disp: 20 tablet, Rfl: 0 fluconazole (DIFLUCAN) 150 MG tablet, Take 1 tablet (150 mg total) by mouth once., Disp: 1 tablet, Rfl: 1;  ondansetron (ZOFRAN) 4 MG tablet, Take 1 tablet (4 mg total) by mouth every 8 (eight) hours as needed for nausea., Disp: 20 tablet, Rfl: 0  EXAM:  Filed Vitals:   01/14/13 1516  BP: 136/82  Pulse: 98  Temp: 98.5 F (36.9 C)    Body mass index is 29.81 kg/(m^2).  GENERAL: vitals reviewed and listed above, alert, oriented, appears well hydrated and in no acute distress  HEENT: atraumatic, conjunttiva clear, no obvious abnormalities on inspection of external nose and ears, normal appearance of ear canals and TMs, clear nasal congestion, mild post oropharyngeal erythema with PND, no tonsillar edema or exudate, no sinus TTP  NECK: no obvious masses on inspection  LUNGS: clear to auscultation bilaterally, no wheezes, rales or rhonchi, good air movement  CV: HRRR, no peripheral edema  ABD: BS+, soft, NTTP, no CVA TTP  MS: moves all extremities without noticeable abnormality -normal gait, mild R lumbar TTP in paraspinal muscles, otherwise normal exam  PSYCH: pleasant and cooperative, no obvious depression or anxiety  ASSESSMENT AND PLAN:  Discussed the following assessment and plan:  Upper respiratory infection - Plan: albuterol (PROVENTIL HFA;VENTOLIN HFA) 108 (90 BASE) MCG/ACT inhaler, POCT urinalysis dipstick, Urine culture, doxycycline (VIBRA-TABS) 100 MG tablet  Dysuria - Plan: POCT urinalysis dipstick, Urine culture  Unspecified essential hypertension - Plan: Basic metabolic panel, POCT urinalysis dipstick, Urine culture  Nausea alone - Plan: ondansetron (ZOFRAN) 4 MG tablet, POCT urinalysis dipstick, Urine culture  -many complaints today, vitals and exam benign, likely viral infection - offered abx given sinus and upper resp isues for 2 weeks but she prefers to hold off on this a few more days -checking  urine and BMP -return and ED precautions, albuterol for occ wheezing and she is to start abx if not improved over next several days -Patient advised to return or notify a doctor immediately if symptoms worsen or persist or new concerns arise. -she wants to return for vaccines once feeling better  Patient Instructions  -We have ordered labs or studies at this visit. It can take up to 1-2 weeks for results and processing. We will contact you with instructions IF your results are abnormal. Normal results will be released to your [facility]. If you have not heard from [place] or can not find your results in [facility] in 2 weeks please contact our office.  Plenty of fluids  If still not feeling well in a few days start antibiotic  Albuterol if needed for wheezing  Follow up  as needed or in 2 weeks if symptoms persist and for vaccines     KIM, HANNAH R.

## 2013-01-15 LAB — BASIC METABOLIC PANEL
Calcium: 10.6 mg/dL — ABNORMAL HIGH (ref 8.4–10.5)
Chloride: 102 mEq/L (ref 96–112)
Creatinine, Ser: 0.9 mg/dL (ref 0.4–1.2)

## 2013-01-15 LAB — URINE CULTURE: Colony Count: NO GROWTH

## 2013-01-16 NOTE — Progress Notes (Signed)
Quick Note:  Called and spoke with pt and pt is aware. Pt states she is feeling better. ______ 

## 2013-02-04 DIAGNOSIS — H3500 Unspecified background retinopathy: Secondary | ICD-10-CM | POA: Diagnosis not present

## 2013-02-04 DIAGNOSIS — H2510 Age-related nuclear cataract, unspecified eye: Secondary | ICD-10-CM | POA: Diagnosis not present

## 2013-02-04 DIAGNOSIS — H02839 Dermatochalasis of unspecified eye, unspecified eyelid: Secondary | ICD-10-CM | POA: Diagnosis not present

## 2013-02-04 DIAGNOSIS — D3130 Benign neoplasm of unspecified choroid: Secondary | ICD-10-CM | POA: Diagnosis not present

## 2013-02-26 ENCOUNTER — Other Ambulatory Visit: Payer: Self-pay

## 2013-02-26 DIAGNOSIS — E7800 Pure hypercholesterolemia, unspecified: Secondary | ICD-10-CM

## 2013-02-26 MED ORDER — ROSUVASTATIN CALCIUM 5 MG PO TABS: ORAL_TABLET | ORAL | Status: DC

## 2013-02-26 NOTE — Telephone Encounter (Signed)
Rx request for Crestor 5 mg.  Take 1/2 tablet by mouth daily.

## 2013-03-07 ENCOUNTER — Ambulatory Visit (INDEPENDENT_AMBULATORY_CARE_PROVIDER_SITE_OTHER): Payer: Medicare Other

## 2013-03-07 DIAGNOSIS — Z23 Encounter for immunization: Secondary | ICD-10-CM | POA: Diagnosis not present

## 2013-03-26 ENCOUNTER — Encounter: Payer: Self-pay | Admitting: *Deleted

## 2013-05-16 ENCOUNTER — Encounter: Payer: Self-pay | Admitting: Family Medicine

## 2013-05-16 ENCOUNTER — Ambulatory Visit (INDEPENDENT_AMBULATORY_CARE_PROVIDER_SITE_OTHER): Payer: Medicare Other | Admitting: Family Medicine

## 2013-05-16 VITALS — BP 182/100 | HR 77 | Temp 98.9°F | Wt 163.0 lb

## 2013-05-16 DIAGNOSIS — J329 Chronic sinusitis, unspecified: Secondary | ICD-10-CM | POA: Diagnosis not present

## 2013-05-16 DIAGNOSIS — J310 Chronic rhinitis: Secondary | ICD-10-CM

## 2013-05-16 DIAGNOSIS — H109 Unspecified conjunctivitis: Secondary | ICD-10-CM | POA: Diagnosis not present

## 2013-05-16 DIAGNOSIS — I10 Essential (primary) hypertension: Secondary | ICD-10-CM

## 2013-05-16 MED ORDER — SULFACETAMIDE SODIUM 10 % OP SOLN: 1.0000 [drp] | OPHTHALMIC | Status: DC

## 2013-05-16 MED ORDER — DOXYCYCLINE HYCLATE 100 MG PO TABS: 100.0000 mg | ORAL_TABLET | Freq: Two times a day (BID) | ORAL | Status: DC

## 2013-05-16 MED ORDER — FLUTICASONE PROPIONATE 50 MCG/ACT NA SUSP: 2.0000 | Freq: Every day | NASAL | Status: DC

## 2013-05-16 NOTE — Patient Instructions (Signed)
-  As we discussed, we have prescribed a new medication for you at this appointment. We discussed the common and serious potential adverse effects of this medication and you can review these and more with the pharmacist when you pick up your medication.  Please follow the instructions for use carefully and notify us immediately if you have any problems taking this medication.  -start flonase and claritin daily  -take the eye drops and doxycycline as instructed  -follow up in 2-4 weeks or sooner if any worsening of symptoms

## 2013-05-16 NOTE — Addendum Note (Signed)
Addended by: Colleen Can on: 05/16/2013 04:36 PM   Modules accepted: Orders

## 2013-05-16 NOTE — Progress Notes (Signed)
Chief Complaint  Patient presents with  . Cough    head congestion, sneezing, feeling like fainting     HPI:  Acute visit for:  Allergies: -for several months -nasal congestion, sneezing, eyes irritated, felt dizzy briefly, cough productive -felt like film on eye last week, now resolved, no vision changes -HA occ resolved by tylenol -mucus in throat, tastes bad -denies: sinus pain, fevers, wheezing, SOB, malaise, vomiting  ROS: See pertinent positives and negatives per HPI.  Past Medical History  Diagnosis Date  . Personal history of colonic polyps 05/10/2009    tubulovillous adenoma  . Personal history of peptic ulcer disease   . Pure hypercholesterolemia   . Unspecified essential hypertension   . External hemorrhoids without mention of complication   . GERD (gastroesophageal reflux disease)   . Gastritis   . Hiatal hernia   . Hypertension   . ADENOMATOUS COLONIC POLYP     Qualifier: Diagnosis of  By: Bertram Gala    . DYSPEPSIA, CHRONIC 04/13/2009    Qualifier: Diagnosis of  By: Sharlett Iles MD Byrd Hesselbach     Past Surgical History  Procedure Laterality Date  . Back surgery  1974  . Carpal tunnel release  2/10  . Tonsillectomy      Family History  Problem Relation Age of Onset  . Colon cancer Neg Hx   . Hypertension Father   . Diabetes Sister   . Hypertension Sister   . Diabetes Brother   . Hypertension Brother   . CAD Mother     Died of MI at age 70  . CAD Brother     Died of MI at age 81  . Stroke Father     History   Social History  . Marital Status: Widowed    Spouse Name: N/A    Number of Children: 2  . Years of Education: N/A   Occupational History  . retired   .     Social History Main Topics  . Smoking status: Never Smoker   . Smokeless tobacco: Never Used  . Alcohol Use: No  . Drug Use: No  . Sexual Activity: None   Other Topics Concern  . None   Social History Narrative  . None    Current outpatient  prescriptions:albuterol (PROVENTIL HFA;VENTOLIN HFA) 108 (90 BASE) MCG/ACT inhaler, Inhale 2 puffs into the lungs every 6 (six) hours as needed for wheezing., Disp: 1 Inhaler, Rfl: 0;  Alum Hydroxide-Mag Carbonate (GAVISCON PO), Take by mouth. As needed , Disp: , Rfl: ;  Bioflavonoid Products (ESTER-C) 500-550 MG TABS, Take 1 capsule by mouth daily.  , Disp: , Rfl:  Bisacodyl (DULCOLAX PO), Take by mouth. As needed , Disp: , Rfl: ;  chlorthalidone (HYGROTON) 25 MG tablet, Take 0.5 tablets (12.5 mg total) by mouth daily., Disp: 135 tablet, Rfl: 1;  esomeprazole (NEXIUM) 40 MG capsule, Take 1 capsule (40 mg total) by mouth 2 (two) times daily., Disp: 180 capsule, Rfl: 3;  ezetimibe (ZETIA) 10 MG tablet, Take 1 tablet (10 mg total) by mouth daily., Disp: 90 tablet, Rfl: 1 fluconazole (DIFLUCAN) 150 MG tablet, Take 1 tablet (150 mg total) by mouth once., Disp: 1 tablet, Rfl: 1;  folic acid (FOLVITE) 1 MG tablet, Take 1 mg by mouth daily.  , Disp: , Rfl: ;  hydrALAZINE (APRESOLINE) 10 MG tablet, , Disp: , Rfl: ;  Linaclotide 290 MCG CAPS, Take 1 capsule by mouth as needed., Disp: , Rfl: ;  lisinopril (PRINIVIL,ZESTRIL)  20 MG tablet, Take 1 tablet (20 mg total) by mouth daily., Disp: 90 tablet, Rfl: 4 Multiple Minerals-Vitamins (CITRACAL PLUS PO), Take 1 capsule by mouth daily.  , Disp: , Rfl: ;  mupirocin ointment (BACTROBAN) 2 %, Apply topically as needed., Disp: , Rfl: ;  ondansetron (ZOFRAN) 4 MG tablet, Take 1 tablet (4 mg total) by mouth every 8 (eight) hours as needed for nausea., Disp: 20 tablet, Rfl: 0;  pramoxine-hydrocortisone (PROCTOCREAM-HC) 1-1 % rectal cream, Place rectally as needed., Disp: , Rfl:  Psyllium 48.57 % POWD, Benefiber Takes daily, Disp: , Rfl: ;  rosuvastatin (CRESTOR) 5 MG tablet, Take 1/2 tablet by mouth daily., Disp: 135 tablet, Rfl: 1;  doxycycline (VIBRA-TABS) 100 MG tablet, Take 1 tablet (100 mg total) by mouth 2 (two) times daily., Disp: 20 tablet, Rfl: 0;  fluticasone (FLONASE) 50  MCG/ACT nasal spray, Place 2 sprays into both nostrils daily., Disp: 16 g, Rfl: 1 sulfacetamide (BLEPH-10) 10 % ophthalmic solution, Place 1 drop into both eyes every 3 (three) hours. For seven days, Disp: 15 mL, Rfl: 0  EXAM:  Filed Vitals:   05/16/13 1343  BP: 182/100  Pulse: 77  Temp: 98.9 F (37.2 C)    Body mass index is 29.81 kg/(m^2).  GENERAL: vitals reviewed and listed above, alert, oriented, appears well hydrated and in no acute distress  HEENT: atraumatic, conjunttiva mildly erythematous bilat with tears, visual acuity grossly intact, PERRLA, no obvious abnormalities on inspection of external nose and ears, normal appearance of ear canals and TMs, clear nasal congestion, mild post oropharyngeal erythema with PND, no tonsillar edema or exudate, no sinus TTP  NECK: no obvious masses on inspection  LUNGS: clear to auscultation bilaterally, no wheezes, rales or rhonchi, good air movement  CV: HRRR, no peripheral edema  MS: moves all extremities without noticeable abnormality  PSYCH: pleasant and cooperative, no obvious depression or anxiety  ASSESSMENT AND PLAN:  Discussed the following assessment and plan:  Conjunctivitis - Plan: sulfacetamide (BLEPH-10) 10 % ophthalmic solution  Rhinosinusitis - Plan: doxycycline (VIBRA-TABS) 100 MG tablet, fluticasone (FLONASE) 50 MCG/ACT nasal spray  HYPERTENSION  -we discussed possible serious and likely etiologies, workup and treatment, treatment risks and return precautions -after this discussion, Mona opted for treatment with flonase and claritin for likely allergies and a course of dxy and eye drops in case chance of bacterial infection -follow up advised in 2-4 weeks -of course, we advised Sarahanne  to return or notify a doctor immediately if symptoms worsen or persist or new concerns arise.  .  -Patient advised to return or notify a doctor immediately if symptoms worsen or persist or new concerns arise.  Patient  Instructions  -As we discussed, we have prescribed a new medication for you at this appointment. We discussed the common and serious potential adverse effects of this medication and you can review these and more with the pharmacist when you pick up your medication.  Please follow the instructions for use carefully and notify us immediately if you have any problems taking this medication.  -start flonase and claritin daily  -take the eye drops and doxycycline as instructed  -follow up in 2-4 weeks or sooner if any worsening of symptoms     KIM, HANNAH R.

## 2013-05-16 NOTE — Progress Notes (Signed)
Pre visit review using our clinic review tool, if applicable. No additional management support is needed unless otherwise documented below in the visit note. 

## 2013-05-19 ENCOUNTER — Telehealth: Payer: Self-pay | Admitting: Family Medicine

## 2013-05-19 NOTE — Telephone Encounter (Signed)
Pt's rx doxycycline (VIBRA-TABS) 100 MG tablet fluticasone (FLONASE) 50 MCG/ACT nasal spray And sulfacetamide (BLEPH-10) 10 % ophthalmic solution  was called [facility] by [facility].  Today pt got a call from [facility] stating rx was shipped on 05/17/13.  This order was canceled by [facility], now pt would ike to know what she should do about this upcoming order. So that she doesn't get billed or charged to return items. Pls advise.

## 2013-05-21 NOTE — Telephone Encounter (Signed)
Called and spoke with Corene Cornea at [facility] and advised of the situation with pt being seen and medication accidentally being sent to [facility] instead of local pharmacy.  Per Corene Cornea this was shipped and it is too late for pt to get a refund (27.87) and tracking package pt should get in 2 days on the 9th.   Called and spoke with pt and pt states she understands this is just a thing that happens. Pt was very understanding.

## 2013-06-10 ENCOUNTER — Telehealth: Payer: Self-pay | Admitting: Family Medicine

## 2013-06-10 ENCOUNTER — Ambulatory Visit (INDEPENDENT_AMBULATORY_CARE_PROVIDER_SITE_OTHER): Payer: Medicare Other | Admitting: Family Medicine

## 2013-06-10 ENCOUNTER — Encounter: Payer: Self-pay | Admitting: Family Medicine

## 2013-06-10 VITALS — BP 140/90 | HR 64 | Temp 99.0°F | Wt 166.0 lb

## 2013-06-10 DIAGNOSIS — K649 Unspecified hemorrhoids: Secondary | ICD-10-CM

## 2013-06-10 DIAGNOSIS — R0982 Postnasal drip: Secondary | ICD-10-CM

## 2013-06-10 DIAGNOSIS — I10 Essential (primary) hypertension: Secondary | ICD-10-CM | POA: Diagnosis not present

## 2013-06-10 DIAGNOSIS — M722 Plantar fascial fibromatosis: Secondary | ICD-10-CM

## 2013-06-10 DIAGNOSIS — J309 Allergic rhinitis, unspecified: Secondary | ICD-10-CM | POA: Diagnosis not present

## 2013-06-10 MED ORDER — HYDROCORTISONE ACE-PRAMOXINE 1-1 % RE CREA: TOPICAL_CREAM | RECTAL | Status: DC | PRN

## 2013-06-10 NOTE — Patient Instructions (Signed)
-  strassburg sock for heel pain  -We placed a referral for you as discussed to the allergist. It usually takes about 1-2 weeks to process and schedule this referral. If you have not heard from [place] regarding this appointment in 2 weeks please contact our office.  -follow up in 3 months and bring blood pressure cuff

## 2013-06-10 NOTE — Telephone Encounter (Signed)
Wayne calling from [facility] need to know if rx can be changed to proctozone 2.5% for pt

## 2013-06-10 NOTE — Progress Notes (Signed)
Chief Complaint  Patient presents with  . Follow-up    HPI:  Follow up:  HTN: -up at last visit but was not feeling well with sinus infection and allergies -home BP has been 130s/60-70s, she has been checking it twice per day -sees Dr. [name], she doe snot want to change the medications  Upper resp symptoms/Allergies: -tx with abx and flonase - meds went to wrong pharmacy -took the abx and tried the flonase for about 10 days but then stopped as did not like nasal spray, antihistamine not helping either -somewhat better but persistent sneezing, PND, drainage, itchy watery eyes -saw allergist a long long time ago and was allergic to some things but can't remember what  Want refill on hemorrhoid cream  L Heel Pain: -starts with first few steps in the morning then better -for a month or two  ROS: See pertinent positives and negatives per HPI.  Past Medical History  Diagnosis Date  . Personal history of colonic polyps 05/10/2009    tubulovillous adenoma  . Personal history of peptic ulcer disease   . Pure hypercholesterolemia   . Unspecified essential hypertension   . External hemorrhoids without mention of complication   . GERD (gastroesophageal reflux disease)   . Gastritis   . Hiatal hernia   . Hypertension   . ADENOMATOUS COLONIC POLYP     Qualifier: Diagnosis of  By: Bertram Gala    . DYSPEPSIA, CHRONIC 04/13/2009    Qualifier: Diagnosis of  By: Sharlett Iles MD Byrd Hesselbach     Past Surgical History  Procedure Laterality Date  . Back surgery  1974  . Carpal tunnel release  2/10  . Tonsillectomy      Family History  Problem Relation Age of Onset  . Colon cancer Neg Hx   . Hypertension Father   . Diabetes Sister   . Hypertension Sister   . Diabetes Brother   . Hypertension Brother   . CAD Mother     Died of MI at age 61  . CAD Brother     Died of MI at age 43  . Stroke Father     History   Social History  . Marital Status: Widowed     Spouse Name: N/A    Number of Children: 2  . Years of Education: N/A   Occupational History  . retired   .     Social History Main Topics  . Smoking status: Never Smoker   . Smokeless tobacco: Never Used  . Alcohol Use: No  . Drug Use: No  . Sexual Activity: None   Other Topics Concern  . None   Social History Narrative  . None    Current outpatient prescriptions:Bioflavonoid Products (ESTER-C) 500-550 MG TABS, Take 1 capsule by mouth daily.  , Disp: , Rfl: ;  Bisacodyl (DULCOLAX PO), Take by mouth. As needed , Disp: , Rfl: ;  chlorthalidone (HYGROTON) 25 MG tablet, Take 0.5 tablets (12.5 mg total) by mouth daily., Disp: 135 tablet, Rfl: 1;  ezetimibe (ZETIA) 10 MG tablet, Take 1 tablet (10 mg total) by mouth daily., Disp: 90 tablet, Rfl: 1 folic acid (FOLVITE) 1 MG tablet, Take 1 mg by mouth daily.  , Disp: , Rfl: ;  Linaclotide 290 MCG CAPS, Take 1 capsule by mouth as needed., Disp: , Rfl: ;  lisinopril (PRINIVIL,ZESTRIL) 20 MG tablet, Take 1 tablet (20 mg total) by mouth daily., Disp: 90 tablet, Rfl: 4;  Multiple Minerals-Vitamins (CITRACAL PLUS PO),  Take 1 capsule by mouth daily.  , Disp: , Rfl:  pramoxine-hydrocortisone (PROCTOCREAM-HC) 1-1 % rectal cream, Place rectally as needed. Use once daily as needed, Disp: 30 g, Rfl: 0;  Psyllium 48.57 % POWD, Benefiber Takes daily, Disp: , Rfl: ;  rosuvastatin (CRESTOR) 5 MG tablet, Take 1/2 tablet by mouth daily., Disp: 135 tablet, Rfl: 1 albuterol (PROVENTIL HFA;VENTOLIN HFA) 108 (90 BASE) MCG/ACT inhaler, Inhale 2 puffs into the lungs every 6 (six) hours as needed for wheezing., Disp: 1 Inhaler, Rfl: 0;  Alum Hydroxide-Mag Carbonate (GAVISCON PO), Take by mouth. As needed , Disp: , Rfl: ;  esomeprazole (NEXIUM) 40 MG capsule, Take 1 capsule (40 mg total) by mouth 2 (two) times daily., Disp: 180 capsule, Rfl: 3  EXAM:  Filed Vitals:   06/10/13 1246  BP: 140/90  Pulse: 64  Temp: 99 F (37.2 C)    Body mass index is 30.35  kg/(m^2).  GENERAL: vitals reviewed and listed above, alert, oriented, appears well hydrated and in no acute distress  HEENT: atraumatic, conjunttiva clear, no obvious abnormalities on inspection of external nose and ears, normal appearance of ear canals and TMs, clear nasal congestion with boggy turbinates, mild post oropharyngeal erythema with PND, no tonsillar edema or exudate, no sinus TTP  NECK: no obvious masses on inspection  MS: moves all extremities without noticeable abnormality Plantar fascia L mild TTP  PSYCH: pleasant and cooperative, no obvious depression or anxiety  ASSESSMENT AND PLAN:  Discussed the following assessment and plan:  PND (post-nasal drip) - Plan: Ambulatory referral to Allergy  Allergic rhinitis - Plan: Ambulatory referral to Allergy  Hemorrhoids - Plan: pramoxine-hydrocortisone (PROCTOCREAM-HC) 1-1 % rectal cream  HYPERTENSION  Plantar fasciitis of left foot  -BP improved after sitting but diastolic still up and advised adjustment in BP meds, she does not wish to do this as reports BP normal at home and has agreed to follow up in 3 months and bring cuff to that appt after discussion risks -for allergic like symptoms she will see the allergist and referral placed -strassburg sock for plantar fasciitis and follow up in 3 months or soon if needed -Patient advised to return or notify a doctor immediately if symptoms worsen or persist or new concerns arise.  Patient Instructions  -strassburg sock for heel pain  -We placed a referral for you as discussed to the allergist. It usually takes about 1-2 weeks to process and schedule this referral. If you have not heard from [place] regarding this appointment in 2 weeks please contact our office.  -follow up in 3 months and bring blood pressure cuff      KIM, Robin R.

## 2013-06-10 NOTE — Telephone Encounter (Signed)
Ok per Dr. Maudie Mercury to change rx to proctozone.  Wayne at [facility] is aware.

## 2013-06-18 ENCOUNTER — Telehealth: Payer: Self-pay | Admitting: Family Medicine

## 2013-06-18 DIAGNOSIS — H1045 Other chronic allergic conjunctivitis: Secondary | ICD-10-CM | POA: Diagnosis not present

## 2013-06-18 DIAGNOSIS — J309 Allergic rhinitis, unspecified: Secondary | ICD-10-CM | POA: Diagnosis not present

## 2013-06-18 NOTE — Telephone Encounter (Signed)
Relevant patient education mailed to patient.  

## 2013-06-26 NOTE — Progress Notes (Signed)
Received office notes from [facility] from visit on 06/18/2013.  Skin testing was attempted; however, there were inadequate histamine controls. Labs will be obtained. Pt's medications include:  Nasonex, Zyrtec and Zaditor.  Follow up in 3 months. Sent to scan.

## 2013-07-17 ENCOUNTER — Telehealth: Payer: Self-pay | Admitting: Family Medicine

## 2013-07-17 DIAGNOSIS — I10 Essential (primary) hypertension: Secondary | ICD-10-CM

## 2013-07-17 MED ORDER — CHLORTHALIDONE 25 MG PO TABS: 12.5000 mg | ORAL_TABLET | Freq: Every day | ORAL | Status: DC

## 2013-07-17 NOTE — Telephone Encounter (Signed)
Pt is needing new rx for chlorthalidone (HYGROTON) 25 MG tablet 90 day supply,. [facility].

## 2013-07-17 NOTE — Telephone Encounter (Signed)
Rx sent to [facility].   

## 2013-08-26 ENCOUNTER — Ambulatory Visit (INDEPENDENT_AMBULATORY_CARE_PROVIDER_SITE_OTHER): Payer: Medicare Other | Admitting: Family Medicine

## 2013-08-26 VITALS — BP 142/88 | Temp 98.8°F | Wt 164.0 lb

## 2013-08-26 DIAGNOSIS — I10 Essential (primary) hypertension: Secondary | ICD-10-CM | POA: Diagnosis not present

## 2013-08-26 DIAGNOSIS — M79672 Pain in left foot: Secondary | ICD-10-CM

## 2013-08-26 DIAGNOSIS — J309 Allergic rhinitis, unspecified: Secondary | ICD-10-CM | POA: Diagnosis not present

## 2013-08-26 DIAGNOSIS — M79609 Pain in unspecified limb: Secondary | ICD-10-CM

## 2013-08-26 MED ORDER — CETIRIZINE HCL 10 MG PO TABS: 10.0000 mg | ORAL_TABLET | Freq: Every day | ORAL | Status: DC

## 2013-08-26 NOTE — Progress Notes (Signed)
Chief Complaint  Patient presents with  . Foot Pain    HPI:  Acute visit for heel pain: -see for this a few months ago and advised strassburg sock -reports: did not try the strassburg socks, not better, L heal pain, worse in mornings sometimes, sometimes worse at night, intermittent, occ burning in feet to - none now, pain is severe at times -denies: fevers, weakness, numbness  Elevated Blood Pressure: -often at office visits but she has declined treatment and reports always normal at home -have asked her to bring cuff and log but she forgot today and reports will bring to physical  Allergic Rhinitis: -saw allergist -on zyrtec and requesting refill  ROS: See pertinent positives and negatives per HPI.  Past Medical History  Diagnosis Date  . Personal history of colonic polyps 05/10/2009    tubulovillous adenoma  . Personal history of peptic ulcer disease   . Pure hypercholesterolemia   . Unspecified essential hypertension   . External hemorrhoids without mention of complication   . GERD (gastroesophageal reflux disease)   . Gastritis   . Hiatal hernia   . Hypertension   . ADENOMATOUS COLONIC POLYP     Qualifier: Diagnosis of  By: Bertram Gala    . DYSPEPSIA, CHRONIC 04/13/2009    Qualifier: Diagnosis of  By: Sharlett Iles MD Byrd Hesselbach     Past Surgical History  Procedure Laterality Date  . Back surgery  1974  . Carpal tunnel release  2/10  . Tonsillectomy      Family History  Problem Relation Age of Onset  . Colon cancer Neg Hx   . Hypertension Father   . Diabetes Sister   . Hypertension Sister   . Diabetes Brother   . Hypertension Brother   . CAD Mother     Died of MI at age 27  . CAD Brother     Died of MI at age 40  . Stroke Father     History   Social History  . Marital Status: Widowed    Spouse Name: N/A    Number of Children: 2  . Years of Education: N/A   Occupational History  . retired   .     Social History Main Topics  .  Smoking status: Never Smoker   . Smokeless tobacco: Never Used  . Alcohol Use: No  . Drug Use: No  . Sexual Activity: Not on file   Other Topics Concern  . Not on file   Social History Narrative  . No narrative on file    Current outpatient prescriptions:albuterol (PROVENTIL HFA;VENTOLIN HFA) 108 (90 BASE) MCG/ACT inhaler, Inhale 2 puffs into the lungs every 6 (six) hours as needed for wheezing., Disp: 1 Inhaler, Rfl: 0;  Alum Hydroxide-Mag Carbonate (GAVISCON PO), Take by mouth. As needed , Disp: , Rfl: ;  Bioflavonoid Products (ESTER-C) 500-550 MG TABS, Take 1 capsule by mouth daily.  , Disp: , Rfl:  Bisacodyl (DULCOLAX PO), Take by mouth. As needed , Disp: , Rfl: ;  chlorthalidone (HYGROTON) 25 MG tablet, Take 0.5 tablets (12.5 mg total) by mouth daily., Disp: 135 tablet, Rfl: 1;  ezetimibe (ZETIA) 10 MG tablet, Take 1 tablet (10 mg total) by mouth daily., Disp: 90 tablet, Rfl: 1;  folic acid (FOLVITE) 1 MG tablet, Take 1 mg by mouth daily.  , Disp: , Rfl: ;  Linaclotide 290 MCG CAPS, Take 1 capsule by mouth as needed., Disp: , Rfl:  lisinopril (PRINIVIL,ZESTRIL) 20 MG tablet,  Take 1 tablet (20 mg total) by mouth daily., Disp: 90 tablet, Rfl: 4;  Multiple Minerals-Vitamins (CITRACAL PLUS PO), Take 1 capsule by mouth daily.  , Disp: , Rfl: ;  pramoxine-hydrocortisone (PROCTOCREAM-HC) 1-1 % rectal cream, Place rectally as needed. Use once daily as needed, Disp: 30 g, Rfl: 0;  Psyllium 48.57 % POWD, Benefiber Takes daily, Disp: , Rfl:  rosuvastatin (CRESTOR) 5 MG tablet, Take 1/2 tablet by mouth daily., Disp: 135 tablet, Rfl: 1;  cetirizine (ZYRTEC) 10 MG tablet, Take 1 tablet (10 mg total) by mouth daily., Disp: 90 tablet, Rfl: 1;  esomeprazole (NEXIUM) 40 MG capsule, Take 1 capsule (40 mg total) by mouth 2 (two) times daily., Disp: 180 capsule, Rfl: 3  EXAM:  Filed Vitals:   08/26/13 1500  BP: 142/88  Temp: 98.8 F (37.1 C)    Body mass index is 29.99 kg/(m^2).  GENERAL: vitals  reviewed and listed above, alert, oriented, appears well hydrated and in no acute distress  HEENT: atraumatic, conjunttiva clear, no obvious abnormalities on inspection of external nose and ears  NECK: no obvious masses on inspection  FOOT: normal apperance except mild halgus valgus, mildly collapsed longitudinal arch, TTP L heal and plantar fascia, normal pedal pulses, normal monofilament, normal vibratory sense and sensation to light touch, normal strength throughout, normal gait, neg tinels, neg talar tilt  MS: moves all extremities without noticeable abnormality  PSYCH: pleasant and cooperative, no obvious depression or anxiety  ASSESSMENT AND PLAN:  Discussed the following assessment and plan:  Foot pain, left - Plan: Ambulatory referral to Podiatry -she did not try the strassburg sock -suspect plantar fasciitis but discuss other potential etiologies and offered further work up here and possible injection versus referral - she prefers to see foot specialist. Referral placed.   HYPERTENSION -has declined treatment but advised again, BP better on recheck 142/88 -advised to bring home log and cuff to physical exam  Allergic rhinitis -refilled zyrtec to [facility] per her request  Advised follow up for physical and labs  -Patient advised to return or notify a doctor immediately if symptoms worsen or persist or new concerns arise.  Patient Instructions  -We placed a referral for you as discussed. It usually takes about 1-2 weeks to process and schedule this referral. If you have not heard from [place] regarding this appointment in 2 weeks please contact our office.  -strassburg sock - at night  -schedule follow up in the next few months for you physical and labs on the same day  -bring blood pressure cuff and log to next appointment!     Robin [name]

## 2013-08-26 NOTE — Patient Instructions (Addendum)
-  We placed a referral for you as discussed. It usually takes about 1-2 weeks to process and schedule this referral. If you have not heard from [place] regarding this appointment in 2 weeks please contact our office.  -strassburg sock - at night  -schedule follow up in the next few months for you physical and labs on the same day  -bring blood pressure cuff and log to next appointment!

## 2013-08-26 NOTE — Progress Notes (Signed)
Pre visit review using our clinic review tool, if applicable. No additional management support is needed unless otherwise documented below in the visit note. 

## 2013-09-15 ENCOUNTER — Ambulatory Visit: Payer: Self-pay | Admitting: Podiatry

## 2013-09-15 ENCOUNTER — Ambulatory Visit (INDEPENDENT_AMBULATORY_CARE_PROVIDER_SITE_OTHER): Payer: Medicare Other | Admitting: Podiatry

## 2013-09-15 ENCOUNTER — Ambulatory Visit (INDEPENDENT_AMBULATORY_CARE_PROVIDER_SITE_OTHER): Payer: Medicare Other

## 2013-09-15 ENCOUNTER — Encounter: Payer: Self-pay | Admitting: Podiatry

## 2013-09-15 VITALS — BP 142/75 | HR 70 | Resp 12

## 2013-09-15 DIAGNOSIS — M722 Plantar fascial fibromatosis: Secondary | ICD-10-CM

## 2013-09-15 DIAGNOSIS — R52 Pain, unspecified: Secondary | ICD-10-CM

## 2013-09-15 DIAGNOSIS — M2010 Hallux valgus (acquired), unspecified foot: Secondary | ICD-10-CM

## 2013-09-15 MED ORDER — TRIAMCINOLONE ACETONIDE 10 MG/ML IJ SUSP
10.0000 mg | Freq: Once | INTRAMUSCULAR | Status: AC
  Administered 2013-09-15: 10 mg

## 2013-09-15 NOTE — Patient Instructions (Signed)

## 2013-09-15 NOTE — Progress Notes (Signed)
   Subjective:    Patient ID: Robin [name], female    DOB: [birth year], 80 y.o.   MRN: [id number]  HPI PT STATED BACK AND BOTTOM OF THE HEEL IS BEEN PAINFUL FOR 5 MONTHS. THE FOOT IS GETTING WORSE. THE FOOT GET AGGRAVATED WHEN WALKING AND PRESSURE ON IT. TRIED ICE, ROLLING TENNIS BALL, AND SPECIAL SOCKS BUT NOTHING IS HELPING.   Review of Systems  All other systems reviewed and are negative.      Objective:   Physical Exam        Assessment & Plan:

## 2013-09-15 NOTE — Progress Notes (Signed)
Subjective:     Patient ID: Robin [name], female   DOB: Aug 23, 1933, 80 y.o.   MRN: [id number]  HPI patient presents stating she's getting a lot of pain in the plantar aspect of her left heel and it's been present for at least 5 months and maybe longer   Review of Systems  All other systems reviewed and are negative.      Objective:   Physical Exam  Nursing note and vitals reviewed. Constitutional: She is oriented to person, place, and time.  Cardiovascular: Intact distal pulses.   Musculoskeletal: Normal range of motion.  Neurological: She is oriented to person, place, and time.  Skin: Skin is warm.   neurovascular status intact with health history unchanged and patient having normal range of motion subtalar midtarsal joint and no equinus condition noted. Digits are well perfused moderate depression of the arch and large hyperostosis medial aspect first metatarsal head left red and moderately painful. Significant discomfort plantar aspect left heel at the insertion of the tendon into the calcaneus     Assessment:     Plantar fasciitis of the left heel with inflammation and fluid buildup and structural bunion deformity left over right foot structure being part of the issue    Plan:     H&P and x-ray performed and reviewed. Today I went ahead and injected the plantar fascial left 3 mg Kenalog 5 mg a Marcaine mixture applied fascially brace and deconstruction reduced activity. Reappoint one week and discussed long-term orthotics and also reviewed structural bunion deformity

## 2013-09-18 ENCOUNTER — Encounter: Payer: Medicare Other | Admitting: Family Medicine

## 2013-09-24 ENCOUNTER — Ambulatory Visit (INDEPENDENT_AMBULATORY_CARE_PROVIDER_SITE_OTHER): Payer: Medicare Other | Admitting: Podiatry

## 2013-09-24 ENCOUNTER — Encounter: Payer: Self-pay | Admitting: Podiatry

## 2013-09-24 VITALS — BP 157/76 | HR 78 | Resp 16

## 2013-09-24 DIAGNOSIS — M722 Plantar fascial fibromatosis: Secondary | ICD-10-CM

## 2013-09-24 NOTE — Progress Notes (Signed)
Subjective:     Patient ID: Robin [name], female   DOB: 1934/03/17, 80 y.o.   MRN: [id number]  HPI patient states my heel is feeling so much better with mild pain in my forefoot   Review of Systems     Objective:   Physical Exam Neurovascular status intact with no health history changes noted and heel that is feeling much better upon palpation    Assessment:     Plantar fasciitis this dramatically improved left    Plan:     Reviewed condition and discussed physical therapy anti-inflammatories and supportive shoe gear. If symptoms continue we'll consider orthotics

## 2013-09-29 ENCOUNTER — Other Ambulatory Visit: Payer: Self-pay

## 2013-09-29 DIAGNOSIS — Z1231 Encounter for screening mammogram for malignant neoplasm of breast: Secondary | ICD-10-CM

## 2013-09-30 ENCOUNTER — Encounter: Payer: Self-pay | Admitting: Family Medicine

## 2013-09-30 ENCOUNTER — Ambulatory Visit (INDEPENDENT_AMBULATORY_CARE_PROVIDER_SITE_OTHER): Payer: Medicare Other | Admitting: Family Medicine

## 2013-09-30 VITALS — BP 150/94 | HR 69 | Temp 97.9°F | Ht 61.0 in | Wt 160.5 lb

## 2013-09-30 DIAGNOSIS — E039 Hypothyroidism, unspecified: Secondary | ICD-10-CM

## 2013-09-30 DIAGNOSIS — R7309 Other abnormal glucose: Secondary | ICD-10-CM | POA: Diagnosis not present

## 2013-09-30 DIAGNOSIS — Z0000 Encounter for general adult medical examination without abnormal findings: Secondary | ICD-10-CM | POA: Diagnosis not present

## 2013-09-30 DIAGNOSIS — M810 Age-related osteoporosis without current pathological fracture: Secondary | ICD-10-CM | POA: Diagnosis not present

## 2013-09-30 DIAGNOSIS — K219 Gastro-esophageal reflux disease without esophagitis: Secondary | ICD-10-CM

## 2013-09-30 DIAGNOSIS — R5383 Other fatigue: Secondary | ICD-10-CM

## 2013-09-30 DIAGNOSIS — R7303 Prediabetes: Secondary | ICD-10-CM

## 2013-09-30 DIAGNOSIS — Z23 Encounter for immunization: Secondary | ICD-10-CM

## 2013-09-30 DIAGNOSIS — R739 Hyperglycemia, unspecified: Secondary | ICD-10-CM

## 2013-09-30 DIAGNOSIS — K589 Irritable bowel syndrome without diarrhea: Secondary | ICD-10-CM | POA: Diagnosis not present

## 2013-09-30 DIAGNOSIS — R5381 Other malaise: Secondary | ICD-10-CM

## 2013-09-30 DIAGNOSIS — I10 Essential (primary) hypertension: Secondary | ICD-10-CM | POA: Diagnosis not present

## 2013-09-30 DIAGNOSIS — E7800 Pure hypercholesterolemia, unspecified: Secondary | ICD-10-CM | POA: Diagnosis not present

## 2013-09-30 LAB — LIPID PANEL
Cholesterol: 185 mg/dL (ref 0–200)
HDL: 62.4 mg/dL (ref >39.00)
LDL Cholesterol: 102 mg/dL — ABNORMAL HIGH (ref 0–99)
Total CHOL/HDL Ratio: 3
Triglycerides: 104 mg/dL (ref 0.0–149.0)
VLDL: 20.8 mg/dL (ref 0.0–40.0)

## 2013-09-30 LAB — BASIC METABOLIC PANEL
BUN: 22 mg/dL (ref 6–23)
CALCIUM: 10.7 mg/dL — AB (ref 8.4–10.5)
CO2: 29 meq/L (ref 19–32)
Chloride: 103 mEq/L (ref 96–112)
Creatinine, Ser: 0.8 mg/dL (ref 0.4–1.2)
GFR: 74.86 mL/min (ref >60.00)
Glucose, Bld: 94 mg/dL (ref 70–99)
Potassium: 4 mEq/L (ref 3.5–5.1)
Sodium: 139 mEq/L (ref 135–145)

## 2013-09-30 LAB — CBC WITH DIFFERENTIAL/PLATELET
BASOS ABS: 0 10*3/uL (ref 0.0–0.1)
Basophils Relative: 0.3 % (ref 0.0–3.0)
EOS ABS: 0.1 10*3/uL (ref 0.0–0.7)
Eosinophils Relative: 2.5 % (ref 0.0–5.0)
HCT: 39.6 % (ref 36.0–46.0)
Hemoglobin: 13.4 g/dL (ref 12.0–15.0)
Lymphocytes Relative: 25.4 % (ref 12.0–46.0)
Lymphs Abs: 1.3 10*3/uL (ref 0.7–4.0)
MCHC: 33.8 g/dL (ref 30.0–36.0)
MCV: 94.8 fl (ref 78.0–100.0)
Monocytes Absolute: 0.4 10*3/uL (ref 0.1–1.0)
Monocytes Relative: 6.7 % (ref 3.0–12.0)
NEUTROS PCT: 65.1 % (ref 43.0–77.0)
Neutro Abs: 3.4 10*3/uL (ref 1.4–7.7)
Platelets: 273 10*3/uL (ref 150.0–400.0)
RBC: 4.18 Mil/uL (ref 3.87–5.11)
RDW: 13 % (ref 11.5–15.5)
WBC: 5.3 10*3/uL (ref 4.0–10.5)

## 2013-09-30 LAB — TSH: TSH: 0.93 u[IU]/mL (ref 0.35–4.50)

## 2013-09-30 LAB — HEMOGLOBIN A1C: HEMOGLOBIN A1C: 5.5 % (ref 4.6–6.5)

## 2013-09-30 MED ORDER — EZETIMIBE 10 MG PO TABS: 10.0000 mg | ORAL_TABLET | Freq: Every day | ORAL | Status: DC

## 2013-09-30 NOTE — Progress Notes (Signed)
Pre visit review using our clinic review tool, if applicable. No additional management support is needed unless otherwise documented below in the visit note. 

## 2013-09-30 NOTE — Patient Instructions (Addendum)
Please see a lawyer and/or go to this website to help you with advanced directives and designating a health care power of attorney so that your wishes will be followed should you become too ill to make your own medical decisions.  [email or web address]  Vitamin D3 1000 IU daily; 1200mg  calcium from diet and supplement  Please get a new blood pressure cuff and continue to keep home log of blood pressure - check a few times per week sitting for 5 minutes, not talking when you check  We recommend the following healthy lifestyle measures: - eat a healthy diet consisting of lots of vegetables, fruits, beans, nuts, seeds, healthy meats such as white chicken and fish and whole grains.  - avoid fried foods, fast food, processed foods, sodas, red meet and other fattening foods.  - get a least 150 minutes of aerobic exercise per week.   -We have ordered labs or studies at this visit. It can take up to 1-2 weeks for results and processing. We will contact you with instructions IF your results are abnormal. Normal results will be released to your [facility]. If you have not heard from [place] or can not find your results in [facility] in 2 weeks please contact our office.         Follow up 4-6 months

## 2013-09-30 NOTE — Progress Notes (Signed)
Medicare Annual Preventive Care Visit  (initial annual wellness or annual wellness exam)  Follow Up:  1) HTN: -was to bring log to this appt - brings log and cuff, home BP in 120-130s/60-70; home cuff was off from our machine running 20 points higher on SBP -meds: chlorthalidone, lisinopril - but did not take BP medication today  2) Foot Pain: -reports doing well, saw podiatry and got a shot and wearing brace  3)HLD: -on rosuvastatin  4)GERD: -nexium prn -doing well  1.) Patient-completed health risk assessment  - completed and reviewed, see scanned documentation  2.) Review of Medical History: -PMH, PSH, Family History and current specialty and care providers reviewed and updated and listed below  - see chart and below  3.) Review of functional ability and level of safety:  Any difficulty hearing? NO  History of falling? NO  Any trouble with IADLs - using a phone, using transportation, grocery shopping, preparing meals, doing housework, doing laundry, taking medications and managing money? NO  Advance Directives? NO  See summary of recommendations in Patient Instructions below.  4.) Physical Exam Filed Vitals:   09/30/13 0822  BP: 150/94  Pulse: 69  Temp: 97.9 F (36.6 C)   Estimated body mass index is 30.34 kg/(m^2) as calculated from the following:   Height as of this encounter: 5\' 1"  (1.549 m).   Weight as of this encounter: 160 lb 8 oz (72.802 kg).  Visual Acuity Grossly Intact: intact with glasses  Mini Cog: 1. Patient instructed to listen carefully and repeat the following: [place]  2. Clock drawing test was administered: NORMAL       3. Recall of three words: 3/3  Scoring:  Patient Score: NEG   See patient instructions for recommendations.  4)The following written screening schedule of preventive measures were reviewed with assessment and plan made per below, orders and patient instructions:      AAA screening:N/A  Alcohol screening: done     Obesity Screening and counseling: done     STI screening: offered, declined     Tobacco Screening: done  Shingles vaccine: wants to get this today      Pneumococcal (PPSV23 -one dose after 64, one before if risk factors), influenza yearly and hepatitis B vaccines (if high risk - end stage renal disease, IV drugs, homosexual men, live in home for mentally retarded, hemophilia receiving factors) ASSESSMENT/PLAN: completed      Screening mammograph (yearly if >40) ASSESSMENT/PLAN: scheduled      Screening Pap smear/pelvic exam (q2 years) ASSESSMENT/PLAN: N/A      Prostate cancer screening ASSESSMENT/PLAN: N/A      Colorectal cancer screening (FOBT yearly or flex sig q4y or colonoscopy q10y or barium enema q4y) ASSESSMENT/PLAN: completed 11/2009 or 12 - seeing gastroenterologist this month - had polyps in the past      Bone mass measurements(covered q2y if indicated - estrogen def, osteoporosis, hyperparathyroid, vertebral abnormalities, osteoporosis or steroids) ASSESSMENT/PLAN: done 2007, declined - refuses tx      Screening for glaucoma(q1y if high risk - diabetes, FH, AA and > 50 or hispanic and > 65) ASSESSMENT/PLAN: sees eye doctor yearly      Cardiovascular screening blood tests (lipids q5y) doing today  ASSESSMENT/PLAN: FASTING LABS today      Diabetes screening tests ASSESSMENT/PLAN: doing today   7.) Summary: -risk factors and conditions per above assessment were discussed and treatment, recommendations and referrals were offered per documentation above and orders and patient instructions.  IBS (irritable bowel syndrome), GERD, hx colon polyps, chronic abd pain - followed by Dr. Sharlett Iles in GI  GERD (gastroesophageal reflux disease)  Osteoporosis  HYPERCHOLESTEROLEMIA - Plan: ezetimibe (ZETIA) 10 MG tablet, Lipid Panel  HYPERTENSION - Plan: Hemoglobin O8N, Basic metabolic panel, CBC with Differential  Medicare annual wellness visit,  subsequent  Other malaise and fatigue - Plan: Hemoglobin A1c, CBC with Differential  Hyperglycemia - Plan: TSH  Prediabetes - Plan: Hemoglobin A1c  Unspecified hypothyroidism - Plan: TSH  FASTING LABs

## 2013-09-30 NOTE — Addendum Note (Signed)
Addended by: Agnes Lawrence on: 09/30/2013 09:33 AM   Modules accepted: Orders

## 2013-10-01 ENCOUNTER — Telehealth: Payer: Self-pay | Admitting: Family Medicine

## 2013-10-01 NOTE — Telephone Encounter (Signed)
Relevant patient education mailed to patient.  

## 2013-10-03 ENCOUNTER — Other Ambulatory Visit: Payer: Self-pay | Admitting: *Deleted

## 2013-10-07 ENCOUNTER — Telehealth: Payer: Self-pay | Admitting: Gastroenterology

## 2013-10-07 ENCOUNTER — Ambulatory Visit (INDEPENDENT_AMBULATORY_CARE_PROVIDER_SITE_OTHER): Payer: Medicare Other | Admitting: Gastroenterology

## 2013-10-07 ENCOUNTER — Encounter: Payer: Self-pay | Admitting: Gastroenterology

## 2013-10-07 VITALS — BP 142/72 | HR 66 | Ht 61.0 in | Wt 162.8 lb

## 2013-10-07 DIAGNOSIS — Z86010 Personal history of colonic polyps: Secondary | ICD-10-CM

## 2013-10-07 DIAGNOSIS — K921 Melena: Secondary | ICD-10-CM | POA: Diagnosis not present

## 2013-10-07 DIAGNOSIS — K644 Residual hemorrhoidal skin tags: Secondary | ICD-10-CM

## 2013-10-07 MED ORDER — PEG-KCL-NACL-NASULF-NA ASC-C 100 G PO SOLR: 1.0000 | Freq: Once | ORAL | Status: DC

## 2013-10-07 NOTE — Telephone Encounter (Signed)
Prescription resent to [facility]. Called [facility] and spoke to April and she states she will cancel the [facility] prep prescription. Patient notified by voicemail.

## 2013-10-07 NOTE — Progress Notes (Signed)
    History of Present Illness: This is a 80 year old female previously followed by Dr. Sharlett Iles. She complains of knot on her rectum. She states this is difficult to keep clean and occasionally it feels irritated. She occasionally notes scant amounts of bright red rectal bleeding that she's attributed to hemorrhoids. She has a history of adenomatous colon polyps her last colonoscopy was performed in January 2012 which revealed diverticulosis and external hemorrhoids. Denies weight loss, abdominal pain, constipation, diarrhea, change in stool caliber, melena, nausea, vomiting, dysphagia, reflux symptoms, chest pain.  Current Medications, Allergies, Past Medical History, Past Surgical History, Family History and Social History were reviewed in [employer] record.  Physical Exam: General: Well developed , well nourished, no acute distress Head: Normocephalic and atraumatic Eyes:  sclerae anicteric, EOMI Ears: Normal auditory acuity Mouth: No deformity or lesions Lungs: Clear throughout to auscultation Heart: Regular rate and rhythm; no murmurs, rubs or bruits Abdomen: Soft, non tender and non distended. No masses, hepatosplenomegaly or hernias noted. Normal Bowel sounds Rectal: Small external hemorrhoid and 2 slightly larger hemorrhoidal tags, no internal lesions, Hemoccult negative stool Musculoskeletal: Symmetrical with no gross deformities  Pulses:  Normal pulses noted Extremities: No clubbing, cyanosis, edema or deformities noted Neurological: Alert oriented x 4, grossly nonfocal Psychological:  Alert and cooperative. Normal mood and affect  Assessment and Recommendations:  1. External hemorrhoid and hemorrhoidal tags. Rectal care instructions and use baby wipes. Hemorrhoidal corticosteroid creams should improve hemorrhoidal symptoms but will not help her hemorrhoidal tags. Offered referral to a rectal surgeon for further evaluation and additional treatment options  but she declined at this time.  2. Personal history of adenomatous colon polyps planned for a three-year intervals surveillance. Schedule colonoscopy. The risks, benefits, and alternatives to colonoscopy with possible biopsy and possible polypectomy were discussed with the patient and they consent to proceed.

## 2013-10-07 NOTE — Patient Instructions (Signed)
You have been scheduled for a colonoscopy with propofol. Please follow written instructions given to you at your visit today.  Please pick up your prep kit at the pharmacy within the next 1-3 days. If you use inhalers (even only as needed), please bring them with you on the day of your procedure. Your physician has requested that you go to www.startemmi.com and enter the access code given to you at your visit today. This web site gives a general overview about your procedure. However, you should still follow specific instructions given to you by our office regarding your preparation for the procedure.  You have been given Rectal care instructions.  If you decide you want to see a surgeon, please call our office back at [phone].  Thank you for choosing me and [facility].  Pricilla Riffle. Dagoberto Ligas., MD., Marval Regal

## 2013-10-17 ENCOUNTER — Ambulatory Visit (AMBULATORY_SURGERY_CENTER): Payer: Medicare Other | Admitting: Gastroenterology

## 2013-10-17 ENCOUNTER — Encounter: Payer: Medicare Other | Admitting: Gastroenterology

## 2013-10-17 ENCOUNTER — Encounter: Payer: Self-pay | Admitting: Gastroenterology

## 2013-10-17 VITALS — BP 147/87 | HR 93 | Temp 96.0°F | Resp 17 | Ht 60.0 in | Wt 162.0 lb

## 2013-10-17 DIAGNOSIS — D126 Benign neoplasm of colon, unspecified: Secondary | ICD-10-CM | POA: Diagnosis not present

## 2013-10-17 DIAGNOSIS — Z86010 Personal history of colonic polyps: Secondary | ICD-10-CM

## 2013-10-17 DIAGNOSIS — I10 Essential (primary) hypertension: Secondary | ICD-10-CM | POA: Diagnosis not present

## 2013-10-17 MED ORDER — SODIUM CHLORIDE 0.9 % IV SOLN: 500.0000 mL | INTRAVENOUS | Status: DC

## 2013-10-17 NOTE — Patient Instructions (Signed)
YOU HAD AN ENDOSCOPIC PROCEDURE TODAY AT THE [facility]: Refer to the procedure report that was given to you for any specific questions about what was found during the examination.  If the procedure report does not answer your questions, please call your gastroenterologist to clarify.  If you requested that your care partner not be given the details of your procedure findings, then the procedure report has been included in a sealed envelope for you to review at your convenience later.  YOU SHOULD EXPECT: Some feelings of bloating in the abdomen. Passage of more gas than usual.  Walking can help get rid of the air that was put into your GI tract during the procedure and reduce the bloating. If you had a lower endoscopy (such as a colonoscopy or flexible sigmoidoscopy) you may notice spotting of blood in your stool or on the toilet paper. If you underwent a bowel prep for your procedure, then you may not have a normal bowel movement for a few days.  DIET: Your first meal following the procedure should be a light meal and then it is ok to progress to your normal diet.  A half-sandwich or bowl of soup is an example of a good first meal.  Heavy or fried foods are harder to digest and may make you feel nauseous or bloated.  Likewise meals heavy in dairy and vegetables can cause extra gas to form and this can also increase the bloating.  Drink plenty of fluids but you should avoid alcoholic beverages for 24 hours.  ACTIVITY: Your care partner should take you home directly after the procedure.  You should plan to take it easy, moving slowly for the rest of the day.  You can resume normal activity the day after the procedure however you should NOT DRIVE or use heavy machinery for 24 hours (because of the sedation medicines used during the test).    SYMPTOMS TO REPORT IMMEDIATELY: A gastroenterologist can be reached at any hour.  During normal business hours, 8:30 AM to 5:00 PM Monday through Friday,  call [phone].  After hours and on weekends, please call the GI answering service at [phone] who will take a message and have the physician on call contact you.   Following lower endoscopy (colonoscopy or flexible sigmoidoscopy):  Excessive amounts of blood in the stool  Significant tenderness or worsening of abdominal pains  Swelling of the abdomen that is new, acute  Fever of 100F or higher  FOLLOW UP: If any biopsies were taken you will be contacted by phone or by letter within the next 1-3 weeks.  Call your gastroenterologist if you have not heard about the biopsies in 3 weeks.  Our staff will call the home number listed on your records the next business day following your procedure to check on you and address any questions or concerns that you may have at that time regarding the information given to you following your procedure. This is a courtesy call and so if there is no answer at the home number and we have not heard from you through the emergency physician on call, we will assume that you have returned to your regular daily activities without incident.  SIGNATURES/CONFIDENTIALITY: You and/or your care partner have signed paperwork which will be entered into your electronic medical record.  These signatures attest to the fact that that the information above on your After Visit Summary has been reviewed and is understood.  Full responsibility of the confidentiality of this   discharge information lies with you and/or your care-partner.  Recommendations Await pathology results High fiber diet with liberal fluid intake Next colonoscopy in 3 years

## 2013-10-17 NOTE — Progress Notes (Signed)
Called to room to assist during endoscopic procedure.  Patient ID and intended procedure confirmed with present staff. Received instructions for my participation in the procedure from the performing physician.  

## 2013-10-17 NOTE — Op Note (Addendum)
[place]  [address] [place], [id number]   COLONOSCOPY PROCEDURE REPORT  PATIENT: Robin, [name]  MR#: [id number] BIRTHDATE: [birth year] , 79  yrs. old GENDER: Female ENDOSCOPIST: [name], MD, [facility] PROCEDURE DATE:  10/17/2013 PROCEDURE:   Colonoscopy with snare polypectomy First Screening Colonoscopy - Avg.  risk and is 50 yrs.  old or older - No.  Prior Negative Screening - Now for repeat screening. N/A  History of Adenoma - Now for follow-up colonoscopy & has been > or = to 3 yrs.  Yes hx of adenoma.  Has been 3 or more years since last colonoscopy.  Polyps Removed Today? Yes. ASA CLASS:   Class II INDICATIONS:Patient's personal history of adenomatous colon polyps: 2 TVAs in 2010. MEDICATIONS: MAC sedation, administered by CRNA and propofol (Diprivan) 280mg  IV DESCRIPTION OF PROCEDURE:   After the risks benefits and alternatives of the procedure were thoroughly explained, informed consent was obtained.  A digital rectal exam revealed no abnormalities of the rectum.   The LB [id number]  endoscope was introduced through the anus and advanced to the cecum, which was identified by both the appendix and ileocecal valve. No adverse events experienced.   The quality of the prep was good, using MoviPrep  The instrument was then slowly withdrawn as the colon was fully examined.  COLON FINDINGS: A sessile polyp measuring 5 mm in size was found in the transverse colon.  A polypectomy was performed with a cold snare.  The resection was complete and the polyp tissue was completely retrieved.   Mild diverticulosis was noted in the sigmoid colon.   The colon was otherwise normal.  There was no diverticulosis, inflammation, polyps or cancers unless previously stated.  Retroflexed views revealed large internal hemorrhoids. The time to cecum=3 minutes 24 seconds.  Withdrawal time=13 minutes 33 seconds.  The scope was withdrawn and the procedure  completed. COMPLICATIONS: There were no complications.  ENDOSCOPIC IMPRESSION: 1.   Sessile polyp measuring 5 mm in the transverse colon; polypectomy performed with a cold snare 2.   Mild diverticulosis in the sigmoid colon 3.   Large internal hemorrhoids  RECOMMENDATIONS: 1.  Await pathology results 2.  High fiber diet with liberal fluid intake. 3.  Repeat Colonoscopy in 3 years.  eSigned:  [name], MD, [facility] 10/17/2013 9:26 AM   cc: Colin Benton, DO

## 2013-10-17 NOTE — Progress Notes (Signed)
Procedure ends, to recovery, report given and VSS. 

## 2013-10-20 ENCOUNTER — Telehealth: Payer: Self-pay | Admitting: *Deleted

## 2013-10-20 NOTE — Telephone Encounter (Signed)
  Follow up Call-  Call back number 10/17/2013  Post procedure Call Back phone  # [phone]  Permission to leave phone message Yes     Patient questions:  Do you have a fever, pain , or abdominal swelling? no Pain Score  0 *  Have you tolerated food without any problems? yes  Have you been able to return to your normal activities? yes  Do you have any questions about your discharge instructions: Diet   no Medications  no Follow up visit  no  Do you have questions or concerns about your Care? no  Actions: * If pain score is 4 or above: No action needed, pain <4.

## 2013-10-22 ENCOUNTER — Encounter: Payer: Self-pay | Admitting: Gastroenterology

## 2013-11-04 ENCOUNTER — Other Ambulatory Visit (INDEPENDENT_AMBULATORY_CARE_PROVIDER_SITE_OTHER): Payer: Medicare Other

## 2013-11-04 LAB — CALCIUM: Calcium: 10.8 mg/dL — ABNORMAL HIGH (ref 8.4–10.5)

## 2013-11-11 ENCOUNTER — Encounter (INDEPENDENT_AMBULATORY_CARE_PROVIDER_SITE_OTHER): Payer: Self-pay

## 2013-11-11 ENCOUNTER — Ambulatory Visit
Admission: RE | Admit: 2013-11-11 | Discharge: 2013-11-11 | Disposition: A | Discharge status: Home / Self Care | Payer: Medicare Other | Source: Ambulatory Visit

## 2013-11-11 DIAGNOSIS — Z1231 Encounter for screening mammogram for malignant neoplasm of breast: Secondary | ICD-10-CM | POA: Diagnosis not present

## 2013-12-25 ENCOUNTER — Ambulatory Visit (INDEPENDENT_AMBULATORY_CARE_PROVIDER_SITE_OTHER): Payer: Medicare Other | Admitting: Podiatry

## 2013-12-25 ENCOUNTER — Encounter: Payer: Self-pay | Admitting: Podiatry

## 2013-12-25 VITALS — BP 176/86 | HR 78 | Resp 16

## 2013-12-25 DIAGNOSIS — M722 Plantar fascial fibromatosis: Secondary | ICD-10-CM

## 2013-12-25 MED ORDER — TRIAMCINOLONE ACETONIDE 10 MG/ML IJ SUSP
10.0000 mg | Freq: Once | INTRAMUSCULAR | Status: AC
  Administered 2013-12-25: 10 mg

## 2013-12-26 NOTE — Progress Notes (Signed)
Subjective:     Patient ID: Robin [name], female   DOB: Sep 25, 1933, 80 y.o.   MRN: [id number]  HPI patient states that her right heel is hurting her and it's starting to make the entire foot hurts and it's worse when she gets up in the morning or after sitting   Review of Systems     Objective:   Physical Exam Neurovascular status intact with discomfort in the plantar right heel at the insertion of the tendon into the calcaneus with fluid buildup noted    Assessment:     Plantar fasciitis right with inflammation    Plan:     Reinjected the plantar fascia 3 mg Kenalog 5 mg I Marcaine mixture and did today dispensed night splint in order to stretch the plantar fascia

## 2013-12-31 ENCOUNTER — Other Ambulatory Visit: Payer: Self-pay | Admitting: Cardiology

## 2014-01-13 ENCOUNTER — Encounter: Payer: Self-pay | Admitting: Gastroenterology

## 2014-01-13 ENCOUNTER — Ambulatory Visit (INDEPENDENT_AMBULATORY_CARE_PROVIDER_SITE_OTHER): Payer: Medicare Other | Admitting: Gastroenterology

## 2014-01-13 VITALS — BP 148/80 | HR 80 | Ht 61.0 in | Wt 162.8 lb

## 2014-01-13 DIAGNOSIS — R1012 Left upper quadrant pain: Secondary | ICD-10-CM

## 2014-01-13 DIAGNOSIS — K219 Gastro-esophageal reflux disease without esophagitis: Secondary | ICD-10-CM

## 2014-01-13 DIAGNOSIS — K5901 Slow transit constipation: Secondary | ICD-10-CM | POA: Diagnosis not present

## 2014-01-13 MED ORDER — ESOMEPRAZOLE MAGNESIUM 40 MG PO CPDR: 40.0000 mg | DELAYED_RELEASE_CAPSULE | Freq: Two times a day (BID) | ORAL | Status: DC

## 2014-01-13 NOTE — Patient Instructions (Signed)
We have sent the following prescriptions to your mail in pharmacy: Nexium.   If you have not heard from your mail in pharmacy within 1 week or if you have not received your medication in the mail, please contact us at [phone] so we may find out why.  Please use Tylenol or Advil for your abdominal pain.   Use over the counter Miralax mixing 17 grams in 8 oz of water 1-2 x daily for constipation.  Thank you for choosing me and [facility].  Pricilla Riffle. Dagoberto Ligas., MD., Marval Regal

## 2014-01-13 NOTE — Progress Notes (Signed)
    History of Present Illness: This is a 80 year old female previously followed by Dr. Sharlett Iles. She has a history of chronic abdominal pain, chronic constipation and GERD. She complains of constant mild left upper quadrant pain that does not change with positioning, meals or bowel movements. She has infrequent, intermittent sharp epigastric pain that appeared to be unrelated to any specific activity. She underwent colonoscopy in June 2015 which showed one polyp, diverticulosis and hemorrhoids. Underwent upper endoscopy in July 2012 which showed small hiatal hernia and mild gastritis. An abdominal ultrasound in 2012 was unremarkable. She states her stools are small, hard and she does not have complete evacuation.  Current Medications, Allergies, Past Medical History, Past Surgical History, Family History and Social History were reviewed in [employer] record.  Physical Exam: General: Well developed , well nourished, no acute distress Head: Normocephalic and atraumatic Eyes:  sclerae anicteric, EOMI Ears: Normal auditory acuity Mouth: No deformity or lesions Lungs: Clear throughout to auscultation Heart: Regular rate and rhythm; no murmurs, rubs or bruits Abdomen: Soft, moderate tenderness to light palpation along left costal margin and non distended. No masses, hepatosplenomegaly or hernias noted. Normal Bowel sounds Rectal: deferred, performed at colonoscopy in 10/2013 Musculoskeletal: Symmetrical with no gross deformities  Pulses:  Normal pulses noted Extremities: No clubbing, cyanosis, edema or deformities noted Neurological: Alert oriented x 4, grossly nonfocal Psychological:  Alert and cooperative. Normal mood and affect  Assessment and Recommendations:  1. LUQ and epigastric pain. Strongly suspect that this is musculoskeletal pain. Trial of Tylenol, Advil or heating pad as needed.  2. Constipation. MiraLax once or twice daily titrated for adequate bowel  movements  3. GERD. Continue standard antireflux measures and Nexium 40 mg twice daily  4. Personal history TVA. Surveillance colonoscopy in 10/2016.

## 2014-01-14 ENCOUNTER — Encounter: Payer: Self-pay | Admitting: Podiatry

## 2014-01-14 ENCOUNTER — Ambulatory Visit (INDEPENDENT_AMBULATORY_CARE_PROVIDER_SITE_OTHER): Payer: Medicare Other | Admitting: Podiatry

## 2014-01-14 VITALS — BP 168/78 | HR 66 | Resp 16

## 2014-01-14 DIAGNOSIS — M722 Plantar fascial fibromatosis: Secondary | ICD-10-CM | POA: Diagnosis not present

## 2014-01-14 MED ORDER — TRIAMCINOLONE ACETONIDE 10 MG/ML IJ SUSP
10.0000 mg | Freq: Once | INTRAMUSCULAR | Status: AC
  Administered 2014-01-14: 10 mg

## 2014-01-14 NOTE — Progress Notes (Signed)
Subjective:     Patient ID: Robin [name], female   DOB: 05/03/1934, 80 y.o.   MRN: [id number]  HPI patient states that I'm still having one area of pain in my left heel but it seems to gradually be some better but it is still sore   Review of Systems     Objective:   Physical Exam Neurovascular status intact with one area that remains tender in the medial fascially band left    Assessment:     Continue plantar fasciitis left    Plan:     Reinjected the plantar fascia left 3 mg Kenalog 5 mg Xylocaine and advised him reduced activity and continued usage of night splint

## 2014-02-02 ENCOUNTER — Encounter: Payer: Self-pay | Admitting: Podiatry

## 2014-02-02 ENCOUNTER — Ambulatory Visit (INDEPENDENT_AMBULATORY_CARE_PROVIDER_SITE_OTHER): Payer: Medicare Other | Admitting: Podiatry

## 2014-02-02 VITALS — BP 148/74 | HR 71 | Resp 18

## 2014-02-02 DIAGNOSIS — M722 Plantar fascial fibromatosis: Secondary | ICD-10-CM

## 2014-02-02 NOTE — Progress Notes (Signed)
Subjective:     Patient ID: Robin [name], female   DOB: [birth year], 80 y.o.   MRN: [id number]  HPI patient states that my left heel is still very sore when I walk and that is not responding to the stretching exercises the injections or the ice therapy   Review of Systems     Objective:   Physical Exam Neurovascular status intact with continued severe discomfort medial fascial band left at the insertion of the tendon into the calcaneus    Assessment:     Continue plantar fasciitis left heel    Plan:     Reviewed condition and at this point discussed that it can require complete immobilization with the possibility long-term for surgery or shockwave therapy. At this time I went ahead and I dispensed air fracture walker with instructions and also discussed shockwave therapy that we will be Visit if symptoms do not resolve with immobilization

## 2014-02-10 DIAGNOSIS — H01009 Unspecified blepharitis unspecified eye, unspecified eyelid: Secondary | ICD-10-CM | POA: Diagnosis not present

## 2014-02-10 DIAGNOSIS — H25019 Cortical age-related cataract, unspecified eye: Secondary | ICD-10-CM | POA: Diagnosis not present

## 2014-02-10 DIAGNOSIS — H43819 Vitreous degeneration, unspecified eye: Secondary | ICD-10-CM | POA: Diagnosis not present

## 2014-02-10 DIAGNOSIS — H2510 Age-related nuclear cataract, unspecified eye: Secondary | ICD-10-CM | POA: Diagnosis not present

## 2014-03-02 ENCOUNTER — Encounter: Payer: Self-pay | Admitting: Podiatry

## 2014-03-02 ENCOUNTER — Ambulatory Visit (INDEPENDENT_AMBULATORY_CARE_PROVIDER_SITE_OTHER): Payer: Medicare Other | Admitting: Podiatry

## 2014-03-02 VITALS — BP 157/84 | HR 82 | Resp 16

## 2014-03-02 DIAGNOSIS — M722 Plantar fascial fibromatosis: Secondary | ICD-10-CM

## 2014-03-02 NOTE — Progress Notes (Signed)
Subjective:     Patient ID: Robin [name], female   DOB: Mar 13, 1934, 80 y.o.   MRN: [id number]  HPI patient presents stating my heel is still hurts but nowhere near a stat and I was unable to wear the boot for long periods of time do to irritation of my hips and knees   Review of Systems     Objective:   Physical Exam Neurovascular status intact with continued discomfort plantar heel left but reduced from previous visit    Assessment:     Plantar fasciitis left with biomechanical flattening of the arch    Plan:     Reviewed condition and at this time scanned for custom orthotics to reduce the pressure against the arch and to try to rebalance the forces. Instructed on physical therapy and that it still possible we'll need to do shockwave therapy

## 2014-03-03 ENCOUNTER — Encounter: Payer: Self-pay | Admitting: Family Medicine

## 2014-03-03 ENCOUNTER — Ambulatory Visit (INDEPENDENT_AMBULATORY_CARE_PROVIDER_SITE_OTHER): Payer: Medicare Other | Admitting: Family Medicine

## 2014-03-03 VITALS — BP 140/78 | HR 93 | Temp 98.1°F | Ht 61.0 in | Wt 163.0 lb

## 2014-03-03 DIAGNOSIS — K219 Gastro-esophageal reflux disease without esophagitis: Secondary | ICD-10-CM | POA: Diagnosis not present

## 2014-03-03 DIAGNOSIS — R0789 Other chest pain: Secondary | ICD-10-CM

## 2014-03-03 DIAGNOSIS — I10 Essential (primary) hypertension: Secondary | ICD-10-CM

## 2014-03-03 DIAGNOSIS — Z23 Encounter for immunization: Secondary | ICD-10-CM

## 2014-03-03 DIAGNOSIS — H8110 Benign paroxysmal vertigo, unspecified ear: Secondary | ICD-10-CM

## 2014-03-03 DIAGNOSIS — E7800 Pure hypercholesterolemia, unspecified: Secondary | ICD-10-CM

## 2014-03-03 DIAGNOSIS — K589 Irritable bowel syndrome without diarrhea: Secondary | ICD-10-CM

## 2014-03-03 NOTE — Patient Instructions (Signed)
Please try taking the nexium every day for 1 month  Seek care immediately if chest pain recurring or other concerns  Follow up in December and would advise we recheck the calcium and if elevated see the endocrinologist per our discussion

## 2014-03-03 NOTE — Progress Notes (Signed)
Pre visit review using our clinic review tool, if applicable. No additional management support is needed unless otherwise documented below in the visit note. 

## 2014-03-03 NOTE — Progress Notes (Signed)
No chief complaint on file.   HPI:  Follow up:   1)HTN -meds: chlorthalidone, lisinopril  -brings log and all normal at home -denies: CP, SOB, DOE, palpitations, swelling   2) Foot Pain:  -seeing podiatrist -reports doing well  3)HLD:  -on rosuvastatin  -denies: cognitive issues, leg cramps  4)GERD:  -nexium prn, occ heartburn, has had some epigastric pain - mild, had some the last few days, now resolving -sees Robin [name] -reviewed recent note -denies: worsening, change in symptoms, blood in stool, weight loss  5)Mild Hypercalcemia: -reports had workup with endocrine in the past and determine it was her diuretic -she has opted to continue diuretic and not do further work up -denies: bone pain, abd pain, stone, mood changes  6)Hx of positional vertigo: -saw a neurologist for workup in 2011 -had vestibular rehab and this resolved -has recurred a few times, two weeks ago had flare and it resolved -she has exercises to do but if recurs and bad wants referral to PT here  7)Atypical Chest Pain: -lower substernal -has had intermittent issues with this, evaluated by GI and cardiolgist in the past, card eval last year -had flare last few days, constant mild, lower substernal pain -now improved -denies: SOB, DOE, palpitations, with activity - relieved with rest  ROS: See pertinent positives and negatives per HPI.  Past Medical History  Diagnosis Date  . Personal history of colonic polyps 05/10/2009    tubulovillous adenoma  . Personal history of peptic ulcer disease   . Pure hypercholesterolemia   . Unspecified essential hypertension   . External hemorrhoids without mention of complication   . GERD (gastroesophageal reflux disease)   . Gastritis   . Hiatal hernia   . Hypertension     Past Surgical History  Procedure Laterality Date  . Back surgery  1974  . Carpal tunnel release  2/10  . Tonsillectomy      Family History  Problem Relation Age of Onset  . Colon  cancer Neg Hx   . Hypertension Father   . Diabetes Sister   . Hypertension Sister   . Diabetes Brother   . Hypertension Brother   . CAD Mother     Died of MI at age 58  . CAD Brother     Died of MI at age 46  . Stroke Father     History   Social History  . Marital Status: Widowed    Spouse Name: N/A    Number of Children: 2  . Years of Education: N/A   Occupational History  . retired   .     Social History Main Topics  . Smoking status: Never Smoker   . Smokeless tobacco: Never Used  . Alcohol Use: No  . Drug Use: No  . Sexual Activity: None   Other Topics Concern  . None   Social History Narrative  . None    Current outpatient prescriptions:AMBULATORY NON FORMULARY MEDICATION, Medication Name: Estra C 500mg  once a day, Disp: , Rfl: ;  Bisacodyl (DULCOLAX PO), Take by mouth. As needed , Disp: , Rfl: ;  cetirizine (ZYRTEC) 10 MG tablet, Take 10 mg by mouth daily as needed for allergies., Disp: , Rfl: ;  chlorthalidone (HYGROTON) 25 MG tablet, Take 0.5 tablets (12.5 mg total) by mouth daily., Disp: 135 tablet, Rfl: 1 esomeprazole (NEXIUM) 40 MG capsule, Take 1 capsule (40 mg total) by mouth 2 (two) times daily., Disp: 180 capsule, Rfl: 3;  ezetimibe (ZETIA) 10 MG tablet,  Take 1 tablet (10 mg total) by mouth daily., Disp: 90 tablet, Rfl: 1;  lisinopril (PRINIVIL,ZESTRIL) 20 MG tablet, TAKE 1 BY MOUTH DAILY, Disp: 90 tablet, Rfl: 1;  rosuvastatin (CRESTOR) 5 MG tablet, Take 1/2 tablet by mouth daily., Disp: 135 tablet, Rfl: 1  EXAM:  Filed Vitals:   03/03/14 1009  BP: 140/78  Pulse: 93  Temp: 98.1 F (36.7 C)    Body mass index is 30.81 kg/(m^2).  GENERAL: vitals reviewed and listed above, alert, oriented, appears well hydrated and in no acute distress  HEENT: atraumatic, conjunttiva clear, no obvious abnormalities on inspection of external nose and ears  NECK: no obvious masses on inspection  LUNGS: clear to auscultation bilaterally, no wheezes, rales or  rhonchi, good air movement  CV: HRRR, no peripheral edema  ABD: BS+, soft, NTTP  MS: moves all extremities without noticeable abnormality  PSYCH: pleasant and cooperative, no obvious depression or anxiety  ASSESSMENT AND [name]:  Discussed the following assessment and [name]:  Need for prophylactic vaccination and inoculation against influenza - [name]: Flu Vaccine QUAD 36+ mos PF IM (Fluarix Quad PF)  Hypercalcemia -she is resistant to checking this today and further workup - we discussed etiologies, etc and she opted to recheck in December then see endocrine if further elevation Unfortunately she does not remember the name or location of the endocrinologist sh saw and reports they retired  Essential hypertension -stable  Gastroesophageal reflux disease, esophagitis presence not specified  HYPERCHOLESTEROLEMIA  Benign paroxysmal positional vertigo, unspecified laterality  IBS (irritable bowel syndrome), GERD, hx colon polyps, chronic abd pain - followed by Robin [name] in GI  Chest pain, atypical -now improved, offer EKG, stress test - but she has had cardiac eval and normal last year and declined -query GERD and she is going to try daily PPI for 1 month  -Patient advised to return or notify a doctor immediately if symptoms worsen or persist or new concerns arise.  Patient Instructions  Please try taking the nexium every day for 1 month  Seek care immediately if chest pain recurring or other concerns  Follow up in December and would advise we recheck the calcium and if elevated see the endocrinologist per our discussion     Robin [name].

## 2014-03-09 ENCOUNTER — Telehealth: Payer: Self-pay | Admitting: Family Medicine

## 2014-03-09 DIAGNOSIS — E7800 Pure hypercholesterolemia, unspecified: Secondary | ICD-10-CM

## 2014-03-09 MED ORDER — ROSUVASTATIN CALCIUM 5 MG PO TABS: ORAL_TABLET | ORAL | Status: DC

## 2014-03-09 NOTE — Telephone Encounter (Signed)
I called the pt and she requested a refill on Crestor and she is aware this was sent to [facility].

## 2014-03-09 NOTE — Telephone Encounter (Signed)
Pt would like you to call her. She declined to elaborate, just saying she wants to explain something to you. pls call Robin [name]

## 2014-03-16 ENCOUNTER — Encounter: Payer: Self-pay | Admitting: Family Medicine

## 2014-03-27 ENCOUNTER — Ambulatory Visit: Payer: Medicare Other

## 2014-03-27 DIAGNOSIS — M722 Plantar fascial fibromatosis: Secondary | ICD-10-CM

## 2014-03-27 NOTE — Patient Instructions (Signed)

## 2014-04-27 ENCOUNTER — Telehealth: Payer: Self-pay | Admitting: *Deleted

## 2014-04-27 NOTE — Telephone Encounter (Signed)
-----   Message from Roney Jaffe, RN sent at 04/27/2014 11:55 AM EST ----- This patietn called stating she needed to speak with Dr Geanie Berlin nurse, she didn't say why or what it was about

## 2014-04-27 NOTE — Telephone Encounter (Signed)
Patient states that she needs an appointment for an injection just to hold her over until after the holidays when she can possibly do the EPAT treatment. Forward to schedulers to call patient.

## 2014-04-29 ENCOUNTER — Ambulatory Visit (INDEPENDENT_AMBULATORY_CARE_PROVIDER_SITE_OTHER): Payer: Medicare Other | Admitting: Podiatry

## 2014-04-29 ENCOUNTER — Encounter: Payer: Self-pay | Admitting: Podiatry

## 2014-04-29 VITALS — BP 160/90 | HR 72 | Resp 16

## 2014-04-29 DIAGNOSIS — M722 Plantar fascial fibromatosis: Secondary | ICD-10-CM

## 2014-04-29 MED ORDER — TRIAMCINOLONE ACETONIDE 10 MG/ML IJ SUSP
10.0000 mg | Freq: Once | INTRAMUSCULAR | Status: AC
  Administered 2014-04-29: 10 mg

## 2014-04-29 NOTE — Progress Notes (Signed)
Subjective:     Patient ID: Robin [name], female   DOB: 01-08-1934, 80 y.o.   MRN: [id number]  HPI patient states my left heel is killing me again and I'm probably can have to do something about it after the first of the year   Review of Systems     Objective:   Physical Exam Neurovascular status intact with muscle strength adequate and range of motion within normal limits. Patient has intense discomfort left plantar fashion at the insertion of the tendon into the calcaneus with inflammation and fluid around the medial band area patient has no other current pathology    Assessment:     Acute plantar fasciitis left that is failed to respond to numerous conservative treatments    Plan:     I did go ahead today and I reinjected the plantar fascia 3 mg Kenalog 5 mg Xylocaine and discussed shockwave or open surgery for this patient. She would rather do shockwave which I think would be a better solution for her and she will reappoint in January to discuss

## 2014-05-20 ENCOUNTER — Ambulatory Visit (INDEPENDENT_AMBULATORY_CARE_PROVIDER_SITE_OTHER): Payer: Medicare Other | Admitting: Podiatry

## 2014-05-20 VITALS — BP 138/81 | HR 70 | Resp 16

## 2014-05-20 DIAGNOSIS — M779 Enthesopathy, unspecified: Secondary | ICD-10-CM

## 2014-05-20 DIAGNOSIS — S93402A Sprain of unspecified ligament of left ankle, initial encounter: Secondary | ICD-10-CM | POA: Diagnosis not present

## 2014-05-20 DIAGNOSIS — M722 Plantar fascial fibromatosis: Secondary | ICD-10-CM

## 2014-05-20 NOTE — Progress Notes (Signed)
Subjective:     Patient ID: Robin [name], female   DOB: [birth year], 81 y.o.   MRN: [id number]  HPI patient states her heel has improved quite a bit with mild discomfort noted but she's getting some pain in her forefoot and also has discomfort with occasional severe ankle sprains left where she feels like her ankle gives away approximately every 3-4 weeks   Review of Systems  All other systems reviewed and are negative.      Objective:   Physical Exam  Constitutional: She is oriented to person, place, and time.  Cardiovascular: Intact distal pulses.   Musculoskeletal: Normal range of motion.  Neurological: She is oriented to person, place, and time.  Skin: Skin is warm.  Nursing note and vitals reviewed.  neurovascular status intact muscle strength was adequate with minimal discomfort plantar aspect left heel and moderate discomfort in the second third and fourth metatarsophalangeal joints left. I checked her ankle fully and found there to be excessive inversion but no indication of ligamentous instability     Assessment:     Separate problems with plantar fasciitis it's better probable compensatory capsulitis and history of ankle sprains left    Plan:     Reviewed all 3 conditions and applied padding for the forefoot left and then discussed possibility for ankle stabilization left if symptoms were to get worse or she were to develop at ankle sprain. At this time we will place it on a watch and she'll reappoint as needed

## 2014-07-07 ENCOUNTER — Encounter: Payer: Self-pay | Admitting: Family Medicine

## 2014-07-07 ENCOUNTER — Ambulatory Visit (INDEPENDENT_AMBULATORY_CARE_PROVIDER_SITE_OTHER): Payer: Medicare Other | Admitting: Family Medicine

## 2014-07-07 VITALS — BP 112/80 | HR 72 | Temp 98.0°F | Ht 61.0 in | Wt 159.6 lb

## 2014-07-07 DIAGNOSIS — E7800 Pure hypercholesterolemia, unspecified: Secondary | ICD-10-CM

## 2014-07-07 DIAGNOSIS — M79662 Pain in left lower leg: Secondary | ICD-10-CM

## 2014-07-07 DIAGNOSIS — R110 Nausea: Secondary | ICD-10-CM

## 2014-07-07 DIAGNOSIS — R600 Localized edema: Secondary | ICD-10-CM

## 2014-07-07 DIAGNOSIS — I10 Essential (primary) hypertension: Secondary | ICD-10-CM

## 2014-07-07 LAB — BASIC METABOLIC PANEL
BUN: 20 mg/dL (ref 6–23)
CALCIUM: 11.2 mg/dL — AB (ref 8.4–10.5)
CO2: 32 mEq/L (ref 19–32)
Chloride: 102 mEq/L (ref 96–112)
Creatinine, Ser: 0.83 mg/dL (ref 0.40–1.20)
GFR: 70.57 mL/min (ref >60.00)
GLUCOSE: 92 mg/dL (ref 70–99)
POTASSIUM: 4.4 meq/L (ref 3.5–5.1)
Sodium: 139 mEq/L (ref 135–145)

## 2014-07-07 LAB — LIPID PANEL
CHOL/HDL RATIO: 3
Cholesterol: 181 mg/dL (ref 0–200)
HDL: 64.4 mg/dL (ref >39.00)
LDL Cholesterol: 94 mg/dL (ref 0–99)
NONHDL: 116.6
Triglycerides: 111 mg/dL (ref 0.0–149.0)
VLDL: 22.2 mg/dL (ref 0.0–40.0)

## 2014-07-07 NOTE — Patient Instructions (Addendum)
BEFORE YOU LEAVE: -labs -follow up in 1 month  Get the ultrasound of your leg  -We placed a referral for you as discussed. It usually takes about 1-2 weeks to process and schedule this referral. If you have not heard from [place] regarding this appointment in 2 weeks please contact our office.   For the nausea: -nexium dialy for [redacted] weeks along  -Align for 2 weeks - probiotic -follow up with your gastroenterologist if persists in 2 weeks or worsens  If calcium still elevated will need to advise change your blood pressure medication or see endocrinologist

## 2014-07-07 NOTE — Progress Notes (Signed)
HPI:  Acute visit for:  L leg pain: -started a few weeks ago, no known trauma -throbbing, constant pain in left lower leg -occurs at night and during the day, worse after being up a lot at the end of the day -better with heat -she describes some swelling at times, though none today -hx plantar fasciitis and seeing podiatrist for this -denies: swelling, fevers, redness  Nausea/loose stools: -hx of chronic abd pain, Gerd, hx PUD, hiatal hernia, IBS, hemorrhoids, diverticulosis, colon polyps and sees GI for this -for last few weeks loose stools occ and mild nausea, mild anorexia, GERD a few times and took her nexium, but does snot take this daily -had colonoscopy 2015, egd 2012, [place] 2012 -denies:vomiting, weight loss, hematochezia, melena, abd pain, no travel, no abx  HTN: -she wants a refill on her chlorthalidone -hx hypercalcemia, reported eval with endo in the past and told was her diuretic but she does not want to stop this medicine and reports did not tolerate many medications -denies: CP, SOB  ROS: See pertinent positives and negatives per HPI.  Past Medical History  Diagnosis Date  . Personal history of colonic polyps 05/10/2009    tubulovillous adenoma  . Personal history of peptic ulcer disease   . Pure hypercholesterolemia   . Unspecified essential hypertension   . External hemorrhoids without mention of complication   . GERD (gastroesophageal reflux disease)   . Gastritis   . Hiatal hernia   . Hypertension     Past Surgical History  Procedure Laterality Date  . Back surgery  1974  . Carpal tunnel release  2/10  . Tonsillectomy      Family History  Problem Relation Age of Onset  . Colon cancer Neg Hx   . Hypertension Father   . Diabetes Sister   . Hypertension Sister   . Diabetes Brother   . Hypertension Brother   . CAD Mother     Died of MI at age 28  . CAD Brother     Died of MI at age 65  . Stroke Father     History   Social History  .  Marital Status: Widowed    Spouse Name: N/A  . Number of Children: 2  . Years of Education: N/A   Occupational History  . retired   .     Social History Main Topics  . Smoking status: Never Smoker   . Smokeless tobacco: Never Used  . Alcohol Use: No  . Drug Use: No  . Sexual Activity: Not on file   Other Topics Concern  . None   Social History Narrative     Current outpatient prescriptions:  .  Bisacodyl (DULCOLAX PO), Take by mouth. As needed , Disp: , Rfl:  .  cetirizine (ZYRTEC) 10 MG tablet, Take 10 mg by mouth daily as needed for allergies., Disp: , Rfl:  .  chlorthalidone (HYGROTON) 25 MG tablet, Take 0.5 tablets (12.5 mg total) by mouth daily., Disp: 135 tablet, Rfl: 1 .  esomeprazole (NEXIUM) 40 MG capsule, Take 1 capsule (40 mg total) by mouth 2 (two) times daily., Disp: 180 capsule, Rfl: 3 .  ezetimibe (ZETIA) 10 MG tablet, Take 1 tablet (10 mg total) by mouth daily., Disp: 90 tablet, Rfl: 1 .  lisinopril (PRINIVIL,ZESTRIL) 20 MG tablet, TAKE 1 BY MOUTH DAILY, Disp: 90 tablet, Rfl: 1 .  rosuvastatin (CRESTOR) 5 MG tablet, Take 1/2 tablet by mouth daily., Disp: 45 tablet, Rfl: 1  EXAM:  Filed  Vitals:   07/07/14 0925  BP: 112/80  Pulse: 72  Temp: 98 F (36.7 C)    Body mass index is 30.17 kg/(m^2).  GENERAL: vitals reviewed and listed above, alert, oriented, appears well hydrated and in no acute distress  HEENT: atraumatic, conjunttiva clear, no obvious abnormalities on inspection of external nose and ears  NECK: no obvious masses on inspection  LUNGS: clear to auscultation bilaterally, no wheezes, rales or rhonchi, good air movement  CV: HRRR, no peripheral edema  ABD: BS+, soft, NTTP, no rebound or guarding  MS: moves all extremities without noticeable abnormality -normal inspection of L LE -no TTP on exam of deep veins, bones or soft tissues this exam -normal pedal pulses and cap refill -normal gross strength and sensation throughout  PSYCH:  pleasant and cooperative, no obvious depression or anxiety  ASSESSMENT AND PLAN:  Discussed the following assessment and plan:  Pain of left lower leg - Plan: Lower Extremity Venous Duplex Left -query tendinous strain due to gait change with her recent PF, however she is concerned for blood clot - no swelling or signs of this or TTP of deep veins on exam today but will get duplex to exclude and discussed tx options if DVT. -if no DVT conservative tx and close follow up to ensure improving  Edema of left lower extremity - Plan: Lower Extremity Venous Duplex Left -reported, no edema on exam  Nausea -benign exam, query flare of her reflux, possible mild gastroenteritis -opted for PPI daily for 2 weeks and align, follow up with GI if worsens or persists  Essential hypertension - Plan: Basic metabolic panel  HYPERCHOLESTEROLEMIA - Plan: Lipid Panel  Serum calcium elevated: -advised stopping chlorthalidone if remains elevated and trial of different medication, she really does not want to stop, advised re-eval with endo if wishes to continue  -Patient advised to return or notify a doctor immediately if symptoms worsen or persist or new concerns arise.  Patient Instructions  BEFORE YOU LEAVE: -labs -follow up in 1 month  Get the ultrasound of your leg  -We placed a referral for you as discussed. It usually takes about 1-2 weeks to process and schedule this referral. If you have not heard from [place] regarding this appointment in 2 weeks please contact our office.   For the nausea: -nexium dialy for [redacted] weeks along  -Align for 2 weeks - probiotic -follow up with your gastroenterologist if persists in 2 weeks or worsens  If calcium still elevated will need to advise change your blood pressure medication or see endocrinologist     Robin [name] R.

## 2014-07-07 NOTE — Progress Notes (Signed)
Pre visit review using our clinic review tool, if applicable. No additional management support is needed unless otherwise documented below in the visit note. 

## 2014-07-08 ENCOUNTER — Other Ambulatory Visit: Payer: Self-pay | Admitting: *Deleted

## 2014-07-08 MED ORDER — LISINOPRIL 20 MG PO TABS: ORAL_TABLET | ORAL | Status: DC

## 2014-07-08 NOTE — Telephone Encounter (Signed)
Rx done. Patient called requesting 15 pills be called to her local pharmacy while she is awaiting mail order from [facility].

## 2014-07-09 ENCOUNTER — Ambulatory Visit (HOSPITAL_COMMUNITY): Payer: Medicare Other | Attending: Cardiology | Admitting: Cardiology

## 2014-07-09 DIAGNOSIS — E785 Hyperlipidemia, unspecified: Secondary | ICD-10-CM | POA: Diagnosis not present

## 2014-07-09 DIAGNOSIS — M79662 Pain in left lower leg: Secondary | ICD-10-CM

## 2014-07-09 DIAGNOSIS — M79605 Pain in left leg: Secondary | ICD-10-CM | POA: Diagnosis not present

## 2014-07-09 DIAGNOSIS — R600 Localized edema: Secondary | ICD-10-CM

## 2014-07-09 DIAGNOSIS — I10 Essential (primary) hypertension: Secondary | ICD-10-CM | POA: Insufficient documentation

## 2014-07-09 NOTE — Progress Notes (Signed)
Left lower extremity venous duplex performed  

## 2014-07-27 ENCOUNTER — Encounter: Payer: Medicare Other | Admitting: *Deleted

## 2014-07-27 ENCOUNTER — Telehealth: Payer: Self-pay | Admitting: Family Medicine

## 2014-07-27 ENCOUNTER — Ambulatory Visit (INDEPENDENT_AMBULATORY_CARE_PROVIDER_SITE_OTHER): Payer: Medicare Other | Admitting: Family Medicine

## 2014-07-27 ENCOUNTER — Encounter: Payer: Self-pay | Admitting: Family Medicine

## 2014-07-27 VITALS — BP 152/96 | HR 64 | Temp 98.1°F | Ht 61.0 in | Wt 162.2 lb

## 2014-07-27 DIAGNOSIS — Z889 Allergy status to unspecified drugs, medicaments and biological substances status: Secondary | ICD-10-CM | POA: Diagnosis not present

## 2014-07-27 DIAGNOSIS — E349 Endocrine disorder, unspecified: Secondary | ICD-10-CM

## 2014-07-27 DIAGNOSIS — I159 Secondary hypertension, unspecified: Secondary | ICD-10-CM | POA: Diagnosis not present

## 2014-07-27 DIAGNOSIS — Z789 Other specified health status: Secondary | ICD-10-CM

## 2014-07-27 MED ORDER — AMLODIPINE BESYLATE 5 MG PO TABS: 5.0000 mg | ORAL_TABLET | Freq: Every day | ORAL | Status: DC

## 2014-07-27 NOTE — Telephone Encounter (Signed)
Dr Maudie Mercury sent medication to the pts pharmacy.

## 2014-07-27 NOTE — Patient Instructions (Signed)
BEFORE YOU LEAVE: -labs -follow up in 2 weeks  Increase lisinopril to 40 mg daily  Start low dose norvasc at 5 mg daily in the morning - we may need to gradually try some of the medications that made you dizzy before and see if your body will adjust after being on them for 3-4 weeks.  Follow up in 2 weeks

## 2014-07-27 NOTE — Progress Notes (Signed)
This encounter was created in error - please disregard.

## 2014-07-27 NOTE — Addendum Note (Signed)
Addended by: Lucretia Kern on: 07/27/2014 11:56 AM   Modules accepted: Orders

## 2014-07-27 NOTE — Progress Notes (Addendum)
HPI:  HTN: -was here for BP check and BP high -hx difficult to control HTN and intolerant to almost every medication -Stopped chlorthalidone due to elevated Ca and increased lisinopril to 30 last visit HCTZ, Diovan, Losartan, Lisinopril, Varapamil, Coreg, metoprolol, spironolactone, felodipine, tekturna, atacand, indapamide, cardizem, lasix, hydralazine -she understands that her elevated BP puts her at risk for stroke and heart attack.  -reports reports did tolerate prinivil, benicar, bystolic - but stopped each because it wasn't working.   ROS: See pertinent positives and negatives per HPI.  Past Medical History  Diagnosis Date  . Personal history of colonic polyps 05/10/2009    tubulovillous adenoma  . Personal history of peptic ulcer disease   . Pure hypercholesterolemia   . Unspecified essential hypertension   . External hemorrhoids without mention of complication   . GERD (gastroesophageal reflux disease)   . Gastritis   . Hiatal hernia   . Hypertension     Past Surgical History  Procedure Laterality Date  . Back surgery  1974  . Carpal tunnel release  2/10  . Tonsillectomy      Family History  Problem Relation Age of Onset  . Colon cancer Neg Hx   . Hypertension Father   . Diabetes Sister   . Hypertension Sister   . Diabetes Brother   . Hypertension Brother   . CAD Mother     Died of MI at age 72  . CAD Brother     Died of MI at age 45  . Stroke Father     History   Social History  . Marital Status: Widowed    Spouse Name: N/A  . Number of Children: 2  . Years of Education: N/A   Occupational History  . retired   .     Social History Main Topics  . Smoking status: Never Smoker   . Smokeless tobacco: Never Used  . Alcohol Use: No  . Drug Use: No  . Sexual Activity: Not on file   Other Topics Concern  . None   Social History Narrative     Current outpatient prescriptions:  .  Bisacodyl (DULCOLAX PO), Take by mouth. As needed , Disp: ,  Rfl:  .  cetirizine (ZYRTEC) 10 MG tablet, Take 10 mg by mouth daily as needed for allergies., Disp: , Rfl:  .  chlorthalidone (HYGROTON) 25 MG tablet, Take 0.5 tablets (12.5 mg total) by mouth daily., Disp: 135 tablet, Rfl: 1 .  esomeprazole (NEXIUM) 40 MG capsule, Take 1 capsule (40 mg total) by mouth 2 (two) times daily., Disp: 180 capsule, Rfl: 3 .  ezetimibe (ZETIA) 10 MG tablet, Take 1 tablet (10 mg total) by mouth daily., Disp: 90 tablet, Rfl: 1 .  lisinopril (PRINIVIL,ZESTRIL) 20 MG tablet, TAKE 1.5  BY MOUTH DAILY, Disp: 15 tablet, Rfl: 0 .  rosuvastatin (CRESTOR) 5 MG tablet, Take 1/2 tablet by mouth daily., Disp: 45 tablet, Rfl: 1  EXAM:  Filed Vitals:   07/27/14 0908  BP: 152/96  Pulse: 64    There is no weight on file to calculate BMI.  GENERAL: vitals reviewed and listed above, alert, oriented, appears well hydrated and in no acute distress  HEENT: atraumatic, conjunttiva clear, no obvious abnormalities on inspection of external nose and ears  NECK: no obvious masses on inspection  LUNGS: clear to auscultation bilaterally, no wheezes, rales or rhonchi, good air movement  CV: HRRR, no peripheral edema  MS: moves all extremities without noticeable abnormality  PSYCH: pleasant  and cooperative, no obvious depression or anxiety  ASSESSMENT AND PLAN:  Discussed the following assessment and plan:  Secondary hypertension, unspecified  Hypercalcemia - Plan: PTH, Intact and Calcium  Medication intolerance  -discussed sig risks of high BP versus her mediation intol -she opted to try norvasc again, try to give some time to adust -advised if intol of other options, BP remains elevated eval with specialist - she is reluctant to do this as reports they will just put he ron medications she can't take like they did in th past -checking calcium today with referral to endo if remains elevated -Patient advised to return or notify a doctor immediately if symptoms worsen or  persist or new concerns arise.  Patient Instructions  BEFORE YOU LEAVE: -labs -follow up in 2 weeks  Increase lisinopril to 40 mg daily  Start low dose norvasc at 5 mg daily in the morning - we may need to gradually try some of the medications that made you dizzy before and see if your body will adjust after being on them for 3-4 weeks.  Follow up in 2 weeks     KIM, HANNAH R.

## 2014-07-27 NOTE — Progress Notes (Signed)
Pre visit review using our clinic review tool, if applicable. No additional management support is needed unless otherwise documented below in the visit note. 

## 2014-07-27 NOTE — Telephone Encounter (Signed)
Pt instructed today at appt to start norvasc at 5 mg daily in the morning . Pls send to [facility].

## 2014-07-28 LAB — PTH, INTACT AND CALCIUM
CALCIUM: 10.2 mg/dL (ref 8.4–10.5)
PTH: 75 pg/mL — AB (ref 14–64)

## 2014-07-29 NOTE — Addendum Note (Signed)
Addended by: Agnes Lawrence on: 07/29/2014 08:36 AM   Modules accepted: Orders

## 2014-08-03 ENCOUNTER — Ambulatory Visit: Payer: Medicare Other | Admitting: Family Medicine

## 2014-08-04 ENCOUNTER — Encounter: Payer: Self-pay | Admitting: Endocrinology

## 2014-08-04 ENCOUNTER — Ambulatory Visit (INDEPENDENT_AMBULATORY_CARE_PROVIDER_SITE_OTHER): Payer: Medicare Other | Admitting: Endocrinology

## 2014-08-04 VITALS — BP 164/94 | HR 74 | Temp 98.4°F | Ht 61.0 in | Wt 160.0 lb

## 2014-08-04 DIAGNOSIS — M810 Age-related osteoporosis without current pathological fracture: Secondary | ICD-10-CM | POA: Diagnosis not present

## 2014-08-04 DIAGNOSIS — E559 Vitamin D deficiency, unspecified: Secondary | ICD-10-CM | POA: Diagnosis not present

## 2014-08-04 LAB — VITAMIN D 25 HYDROXY (VIT D DEFICIENCY, FRACTURES): VITD: 13.44 ng/mL — ABNORMAL LOW (ref 30.00–100.00)

## 2014-08-04 MED ORDER — VITAMIN D (ERGOCALCIFEROL) 1.25 MG (50000 UNIT) PO CAPS: 50000.0000 [IU] | ORAL_CAPSULE | ORAL | Status: DC

## 2014-08-04 NOTE — Progress Notes (Signed)
Subjective:    Patient ID: Robin [name], female    DOB: 1934-02-16, 81 y.o.   MRN: [id number]  HPI Pt was first noted to have hypercalcemia in 2009.  she has never had urolithiasis, PUD, pancreatitis, depression, or bony fracture.  She has osteoporosis.  she does not take vitamin-D or A supplements.  2 weeks ago, chlorthalidone was stopped.    Past Medical History  Diagnosis Date  . Personal history of colonic polyps 05/10/2009    tubulovillous adenoma  . Personal history of peptic ulcer disease   . Pure hypercholesterolemia   . Unspecified essential hypertension   . External hemorrhoids without mention of complication   . GERD (gastroesophageal reflux disease)   . Gastritis   . Hiatal hernia   . Hypertension     Past Surgical History  Procedure Laterality Date  . Back surgery  1974  . Carpal tunnel release  2/10  . Tonsillectomy      History   Social History  . Marital Status: Widowed    Spouse Name: N/A  . Number of Children: 2  . Years of Education: N/A   Occupational History  . retired   .     Social History Main Topics  . Smoking status: Never Smoker   . Smokeless tobacco: Never Used  . Alcohol Use: No  . Drug Use: No  . Sexual Activity: Not on file   Other Topics Concern  . Not on file   Social History Narrative    Current Outpatient Prescriptions on File Prior to Visit  Medication Sig Dispense Refill  . amLODipine (NORVASC) 5 MG tablet Take 1 tablet (5 mg total) by mouth daily. 30 tablet 3  . Bisacodyl (DULCOLAX PO) Take by mouth. As needed     . cetirizine (ZYRTEC) 10 MG tablet Take 10 mg by mouth daily as needed for allergies.    Marland Kitchen esomeprazole (NEXIUM) 40 MG capsule Take 1 capsule (40 mg total) by mouth 2 (two) times daily. 180 capsule 3  . ezetimibe (ZETIA) 10 MG tablet Take 1 tablet (10 mg total) by mouth daily. 90 tablet 1  . lisinopril (PRINIVIL,ZESTRIL) 20 MG tablet TAKE 1.5  BY MOUTH DAILY (Patient taking differently: 40 mg. TAKE 1.5  BY  MOUTH DAILY) 15 tablet 0  . rosuvastatin (CRESTOR) 5 MG tablet Take 1/2 tablet by mouth daily. 45 tablet 1   No current facility-administered medications on file prior to visit.    Allergies  Allergen Reactions  . Amoxicillin     REACTION: rash  . Metronidazole     REACTION: rash    Family History  Problem Relation Age of Onset  . Colon cancer Neg Hx   . Hypertension Father   . Diabetes Sister   . Hypertension Sister   . Diabetes Brother   . Hypertension Brother   . CAD Mother     Died of MI at age 60  . CAD Brother     Died of MI at age 7  . Stroke Father   . Hypercalcemia Neg Hx     BP 164/94 mmHg  Pulse 74  Temp(Src) 98.4 F (36.9 C) (Oral)  Ht 5\' 1"  (1.549 m)  Wt 160 lb (72.576 kg)  BMI 30.25 kg/m2  SpO2 96%   Review of Systems denies weight loss, galactorrhea, hematuria, memory loss, menopausal sxs, numbness, arthralgias, abdominal pain, muscle weakness, skin rash, visual loss, sob, diarrhea, and depression.  She has mild rhinorrhea and easy bruising.  Objective:   Physical Exam VS: see vs page GEN: no distress HEAD: head: no deformity eyes: no periorbital swelling, no proptosis external nose and ears are normal mouth: no lesion seen NECK: supple, thyroid is not enlarged CHEST WALL: no kyphosis. LUNGS:  Clear to auscultation CV: reg rate and rhythm, no murmur. ABD: abdomen is soft, nontender.  no hepatosplenomegaly.  not distended.  no hernia MUSCULOSKELETAL: muscle bulk and strength are grossly normal.  no obvious joint swelling.  gait is normal and steady.   EXTEMITIES: no deformity. no edema.  PULSES: dorsalis pedis intact bilat.  no carotid bruit.  NEURO:  cn 2-12 grossly intact.   readily moves all 4's.  sensation is intact to touch on the feet. Slight tremor of the hands.  SKIN:  Normal texture and temperature.  No rash or suspicious lesion is visible.   NODES:  None palpable at the neck.  PSYCH: alert, well-oriented.  Does not appear  anxious nor depressed.    25-OH vit-D=14 Lab Results  Component Value Date   PTH 75* 07/27/2014   CALCIUM 10.2 07/27/2014      Assessment & Plan:  Vit-D deficiency, new Hyperparathyroidism, probably a combination of primary and secondary HTN: ? Situational component.   Patient is advised the following: Patient Instructions  blood tests are being requested for you today.  We'll let you know about the results. Also, please recheck the bone density.   Please come back for a follow-up appointment in 3 weeks.   Please continue the same medications for your blood pressure.  addendum: i have sent a prescription to your pharmacy, for vitamin-D

## 2014-08-04 NOTE — Patient Instructions (Addendum)
blood tests are being requested for you today.  We'll let you know about the results. Also, please recheck the bone density.   Please come back for a follow-up appointment in 3 weeks.   Please continue the same medications for your blood pressure.

## 2014-08-05 ENCOUNTER — Telehealth: Payer: Self-pay | Admitting: *Deleted

## 2014-08-05 NOTE — Telephone Encounter (Signed)
Dr.Ellison nurse called asking if pt can have her dexa done here, had done here in 2013. Dx: osteoporosis, Dr.Ellison placed order in computer system and his note is in epic to review as well. Pt is overdue for annual, last seen in April 2013. Okay for patient to schedule dexa here? Please advise

## 2014-08-05 NOTE — Telephone Encounter (Signed)
If she had her bone density study here last time it would be beneficial to monitor with more Garner Gavel see if it is done here yes

## 2014-08-05 NOTE — Telephone Encounter (Signed)
Sent staff message to claudia for pt to schedule.

## 2014-08-06 ENCOUNTER — Other Ambulatory Visit: Payer: Self-pay | Admitting: Gynecology

## 2014-08-06 DIAGNOSIS — M810 Age-related osteoporosis without current pathological fracture: Secondary | ICD-10-CM

## 2014-08-10 ENCOUNTER — Ambulatory Visit (INDEPENDENT_AMBULATORY_CARE_PROVIDER_SITE_OTHER): Payer: Medicare Other | Admitting: Family Medicine

## 2014-08-10 ENCOUNTER — Telehealth: Payer: Self-pay | Admitting: Family Medicine

## 2014-08-10 ENCOUNTER — Encounter: Payer: Self-pay | Admitting: Family Medicine

## 2014-08-10 VITALS — BP 138/80 | HR 79 | Temp 98.7°F | Ht 61.0 in | Wt 160.4 lb

## 2014-08-10 DIAGNOSIS — Z23 Encounter for immunization: Secondary | ICD-10-CM | POA: Diagnosis not present

## 2014-08-10 DIAGNOSIS — I10 Essential (primary) hypertension: Secondary | ICD-10-CM | POA: Diagnosis not present

## 2014-08-10 NOTE — Addendum Note (Signed)
Addended by: Agnes Lawrence on: 08/10/2014 11:23 AM   Modules accepted: Orders

## 2014-08-10 NOTE — Patient Instructions (Signed)
BEFORE YOU LEAVE: -prevnar 13 -follow up appointment in 3-4 months

## 2014-08-10 NOTE — Progress Notes (Addendum)
HPI:  HTN: -she is taking lisinopril 20mg , Norvasc 5mg  and is doing well! -she has a long hx of poor tolerance to many, many anti-hypertensive medications -hx hypercalcemia on chlorthalidone -BP is good! -Denies: CP, SOB, DOE, swelling, palpitations   ROS: See pertinent positives and negatives per HPI.  Past Medical History  Diagnosis Date  . Personal history of colonic polyps 05/10/2009    tubulovillous adenoma  . Personal history of peptic ulcer disease   . Pure hypercholesterolemia   . Unspecified essential hypertension   . External hemorrhoids without mention of complication   . GERD (gastroesophageal reflux disease)   . Gastritis   . Hiatal hernia   . Hypertension     Past Surgical History  Procedure Laterality Date  . Back surgery  1974  . Carpal tunnel release  2/10  . Tonsillectomy      Family History  Problem Relation Age of Onset  . Colon cancer Neg Hx   . Hypertension Father   . Diabetes Sister   . Hypertension Sister   . Diabetes Brother   . Hypertension Brother   . CAD Mother     Died of MI at age 8  . CAD Brother     Died of MI at age 38  . Stroke Father   . Hypercalcemia Neg Hx     History   Social History  . Marital Status: Widowed    Spouse Name: N/A  . Number of Children: 2  . Years of Education: N/A   Occupational History  . retired   .     Social History Main Topics  . Smoking status: Never Smoker   . Smokeless tobacco: Never Used  . Alcohol Use: No  . Drug Use: No  . Sexual Activity: Not on file   Other Topics Concern  . None   Social History Narrative     Current outpatient prescriptions:  .  amLODipine (NORVASC) 5 MG tablet, Take 1 tablet (5 mg total) by mouth daily., Disp: 30 tablet, Rfl: 3 .  Bisacodyl (DULCOLAX PO), Take by mouth. As needed , Disp: , Rfl:  .  cetirizine (ZYRTEC) 10 MG tablet, Take 10 mg by mouth daily as needed for allergies., Disp: , Rfl:  .  esomeprazole (NEXIUM) 40 MG capsule, Take 1  capsule (40 mg total) by mouth 2 (two) times daily., Disp: 180 capsule, Rfl: 3 .  ezetimibe (ZETIA) 10 MG tablet, Take 1 tablet (10 mg total) by mouth daily., Disp: 90 tablet, Rfl: 1 .  lisinopril (PRINIVIL,ZESTRIL) 20 MG tablet, TAKE 1.5  BY MOUTH DAILY (Patient taking differently: 40 mg. TAKE 1.5  BY MOUTH DAILY), Disp: 15 tablet, Rfl: 0 .  rosuvastatin (CRESTOR) 5 MG tablet, Take 1/2 tablet by mouth daily., Disp: 45 tablet, Rfl: 1 .  Vitamin D, Ergocalciferol, (DRISDOL) 50000 UNITS CAPS capsule, Take 1 capsule (50,000 Units total) by mouth every 7 (seven) days., Disp: 10 capsule, Rfl: 0  EXAM:  Filed Vitals:   08/10/14 1048  BP: 138/80  Pulse: 79  Temp: 98.7 F (37.1 C)    Body mass index is 30.32 kg/(m^2).  GENERAL: vitals reviewed and listed above, alert, oriented, appears well hydrated and in no acute distress  HEENT: atraumatic, conjunttiva clear, no obvious abnormalities on inspection of external nose and ears  NECK: no obvious masses on inspection  LUNGS: clear to auscultation bilaterally, no wheezes, rales or rhonchi, good air movement  CV: HRRR, no peripheral edema  MS: moves all extremities  without noticeable abnormality  PSYCH: pleasant and cooperative, no obvious depression or anxiety  ASSESSMENT AND PLAN:  Discussed the following assessment and plan:  Essential hypertension  -she is doing well and thankfully tolerated the norvasc -she is seeing endo for the hypercalcemia, notes reviewed -follow up in 3-4 months -Patient advised to return or notify a doctor immediately if symptoms worsen or persist or new concerns arise.  Patient Instructions  BEFORE YOU LEAVE: -prevnar 13 -follow up appointment in 3-4 months     Robin [name].

## 2014-08-10 NOTE — Telephone Encounter (Signed)
The dose of Lisinoprill was changed on the pts medication list.

## 2014-08-10 NOTE — Progress Notes (Signed)
Pre visit review using our clinic review tool, if applicable. No additional management support is needed unless otherwise documented below in the visit note. 

## 2014-08-10 NOTE — Telephone Encounter (Signed)
Pt called to say that the following medicine should read 40mg  and not 20mg . She want her file to reflect 40mg 

## 2014-08-13 ENCOUNTER — Ambulatory Visit (INDEPENDENT_AMBULATORY_CARE_PROVIDER_SITE_OTHER): Payer: Medicare Other | Admitting: Gynecology

## 2014-08-13 ENCOUNTER — Encounter: Payer: Self-pay | Admitting: Gynecology

## 2014-08-13 VITALS — BP 146/80 | Ht 61.0 in | Wt 158.0 lb

## 2014-08-13 DIAGNOSIS — M810 Age-related osteoporosis without current pathological fracture: Secondary | ICD-10-CM

## 2014-08-13 DIAGNOSIS — Z01419 Encounter for gynecological examination (general) (routine) without abnormal findings: Secondary | ICD-10-CM | POA: Diagnosis not present

## 2014-08-13 NOTE — Progress Notes (Deleted)
   Patient is a 81 year old who has not been seen the office since 2013. She is here today because of a chronic yellow vaginal discharge. She is currently seeing some holistic health care provider where she has been receiving these "subdermal implant" consisting of testosterone, progesterone and estrogen every 3 months for her menopausal symptoms. She stated that a few weeks ago she had blood work drawn at her labs were normal. Her primary care physician is Dr. Hulan Fess had been doing her blood work. Review of her record also indicated she's had history of osteoporosis and has been adamant of cord on any form of treatment. Patient is sexually active with the same partner for 40+ years.  Exam: Weight 158 pounds

## 2014-08-13 NOTE — Progress Notes (Signed)
Robin [name] [birth year] [id number]   History:    81 y.o.  for annual gyn exam who has not been seen the office since 2013. Her primary care physician has been monitoring her hypercholesterolemia and hypertension. Also Dr. Loanne Drilling recently has been monitor her vitamin D since she has had vitamin D deficiency she is currently under treatment.Review of notes from prior provider indicated her last bone density study was in 2007 and her lowest T score was at the left femoral neck with a score of -2.5 (osteoporotic range). It appears that she has been offered treatment for this but has been adamant. Patient did have a bone density study here in our office back in 2013 which demonstrated at that her right femoral neck had a T score of -2.3 with the aid Frax risk of greater than 3% but still patient did not want to go on any anti-resorptive agents. Patient had a colonoscopy in 2015 she does have history of colon polyps and is being monitored every 3 years. Patient with no past history of any abnormal Pap smears. Her last mammogram was normal in June 2015. Patient on no hormone replacement therapy.  Past medical history,surgical history, family history and social history were all reviewed and documented in the EPIC chart.  Gynecologic History No LMP recorded. Patient is postmenopausal. Contraception: post menopausal status Last Pap:  2010. Results were: normal Last mammogram: 2015. Results were: normal  Obstetric History OB History  Gravida Para Term Preterm AB SAB TAB Ectopic Multiple Living  2 2 2       2     # Outcome Date GA Lbr Len/2nd Weight Sex Delivery Anes PTL Lv  2 Term     F Vag-Spont  N Y  1 Term     M Vag-Spont  N Y       ROS: A ROS was performed and pertinent positives and negatives are included in the history.  GENERAL: No fevers or chills. HEENT: No change in vision, no earache, sore throat or sinus congestion. NECK: No pain or stiffness. CARDIOVASCULAR: No chest pain or  pressure. No palpitations. PULMONARY: No shortness of breath, cough or wheeze. GASTROINTESTINAL: No abdominal pain, nausea, vomiting or diarrhea, melena or bright red blood per rectum. GENITOURINARY: No urinary frequency, urgency, hesitancy or dysuria. MUSCULOSKELETAL: No joint or muscle pain, no back pain, no recent trauma. DERMATOLOGIC: No rash, no itching, no lesions. ENDOCRINE: No polyuria, polydipsia, no heat or cold intolerance. No recent change in weight. HEMATOLOGICAL: No anemia or easy bruising or bleeding. NEUROLOGIC: No headache, seizures, numbness, tingling or weakness. PSYCHIATRIC: No depression, no loss of interest in normal activity or change in sleep pattern.     Exam: chaperone present  BP 146/80 mmHg  Ht 5\' 1"  (1.549 m)  Wt 158 lb (71.668 kg)  BMI 29.87 kg/m2  Body mass index is 29.87 kg/(m^2).  General appearance : Well developed well nourished female. No acute distress HEENT: Eyes: no retinal hemorrhage or exudates,  Neck supple, trachea midline, no carotid bruits, no thyroidmegaly Lungs: Clear to auscultation, no rhonchi or wheezes, or rib retractions  Heart: Regular rate and rhythm, no murmurs or gallops Breast:Examined in sitting and supine position were symmetrical in appearance, no palpable masses or tenderness,  no skin retraction, no nipple inversion, no nipple discharge, no skin discoloration, no axillary or supraclavicular lymphadenopathy Abdomen: no palpable masses or tenderness, no rebound or guarding Extremities: no edema or skin discoloration or tenderness  Pelvic:  Bartholin, Urethra, Skene  Glands: Within normal limits             Vagina: No gross lesions or discharge, vaginal atrophy  Cervix: No gross lesions or discharge  Uterus  axial, normal size, shape and consistency, non-tender and mobile  Adnexa  Without masses or tenderness  Anus and perineum  normal   Rectovaginal  normal sphincter tone without palpated masses or tenderness              Hemoccult recent colonoscopy     Assessment/Plan:  81 y.o. female for annual exam who will no longer need Pap smears according to the new guidelines. Patient with no past history of any high risk abnormal Pap smears. Patient is scheduled for bone density study within the next few weeks we'll compare with previous studies and make recommendations at that time. She will continue her vitamin D supplementation as recommended by Dr. Loanne Drilling who is monitoring her vitamin D deficiency. She'll continue to get her blood work by her PCP as well. She is due for her mammogram in June of this year. Patient's vaccines are up-to-date.   Terrance Mass MD, 12:36 PM 08/13/2014

## 2014-08-13 NOTE — Patient Instructions (Signed)

## 2014-08-18 ENCOUNTER — Ambulatory Visit (INDEPENDENT_AMBULATORY_CARE_PROVIDER_SITE_OTHER): Payer: Medicare Other

## 2014-08-18 DIAGNOSIS — M810 Age-related osteoporosis without current pathological fracture: Secondary | ICD-10-CM

## 2014-08-19 ENCOUNTER — Ambulatory Visit (INDEPENDENT_AMBULATORY_CARE_PROVIDER_SITE_OTHER): Payer: Medicare Other | Admitting: Podiatry

## 2014-08-19 ENCOUNTER — Encounter: Payer: Self-pay | Admitting: Podiatry

## 2014-08-19 DIAGNOSIS — M722 Plantar fascial fibromatosis: Secondary | ICD-10-CM

## 2014-08-19 MED ORDER — TRIAMCINOLONE ACETONIDE 10 MG/ML IJ SUSP
10.0000 mg | Freq: Once | INTRAMUSCULAR | Status: AC
  Administered 2014-08-19: 10 mg

## 2014-08-20 NOTE — Progress Notes (Signed)
Subjective:     Patient ID: Robin [name], female   DOB: [birth year], 81 y.o.   MRN: [id number]  HPI patient presents stating I'm having a lot of pain in the bottom of my left heel making it hard for me to walk   Review of Systems     Objective:   Physical Exam Neurovascular status intact no other health history changes noted with pain in the plantar left heel    Assessment:     Plantar fasciitis left    Plan:     Reinjected the plantar fascia 3 Milligan grams Kenalog 5 mill grams Xylocaine and instructed on physical therapy

## 2014-08-24 ENCOUNTER — Ambulatory Visit (INDEPENDENT_AMBULATORY_CARE_PROVIDER_SITE_OTHER): Payer: Medicare Other | Admitting: Endocrinology

## 2014-08-24 ENCOUNTER — Telehealth: Payer: Self-pay | Admitting: Family Medicine

## 2014-08-24 ENCOUNTER — Encounter: Payer: Self-pay | Admitting: Endocrinology

## 2014-08-24 VITALS — BP 170/104 | HR 73 | Temp 98.6°F | Ht 61.0 in | Wt 161.0 lb

## 2014-08-24 DIAGNOSIS — E559 Vitamin D deficiency, unspecified: Secondary | ICD-10-CM | POA: Diagnosis not present

## 2014-08-24 DIAGNOSIS — E213 Hyperparathyroidism, unspecified: Secondary | ICD-10-CM | POA: Diagnosis not present

## 2014-08-24 LAB — VITAMIN D 25 HYDROXY (VIT D DEFICIENCY, FRACTURES): VITD: 33.6 ng/mL (ref 30.00–100.00)

## 2014-08-24 MED ORDER — LISINOPRIL 40 MG PO TABS: 40.0000 mg | ORAL_TABLET | Freq: Every day | ORAL | Status: DC

## 2014-08-24 MED ORDER — FUROSEMIDE 20 MG PO TABS: 20.0000 mg | ORAL_TABLET | Freq: Every day | ORAL | Status: DC

## 2014-08-24 NOTE — Patient Instructions (Addendum)
blood tests are being requested for you today.  We'll let you know about the results.   i have sent a prescription to your pharmacy, for a different fluid pill.   Please come back for a follow-up appointment in 3 months.

## 2014-08-24 NOTE — Telephone Encounter (Signed)
Pt request refill of the following: lisinopril (PRINIVIL,ZESTRIL) 40 MG tablet     Pharmacy:  [facility]

## 2014-08-24 NOTE — Telephone Encounter (Signed)
Rx done and the pt is taking 40mg  a day.

## 2014-08-24 NOTE — Telephone Encounter (Signed)
Dr Nathaniel Man verify dose as last office visit note states the pt should be taking 1.5?

## 2014-08-24 NOTE — Telephone Encounter (Signed)
I had instructed her to take 40mg , can you plese verify with her and then refill according to how she is taking it? Please provide 90 days with 3 refills. Please update the medication list accordingly.Thank you.

## 2014-08-24 NOTE — Progress Notes (Signed)
Subjective:    Patient ID: Robin [name], female    DOB: 04-14-1934, 81 y.o.   MRN: [id number]  HPI  The state of at least three ongoing medical problems is addressed today, with interval history of each noted here: Hypercalcemia (first noted in 2009; she has never had urolithiasis, PUD, pancreatitis, depression, or bony fracture).  Denies cramps Osteoporosis (no h/o bony fracture; denies falls) HTN: 2 weeks ago, chlorthalidone was stopped.  Edema persists Past Medical History  Diagnosis Date  . Personal history of colonic polyps 05/10/2009    tubulovillous adenoma  . Personal history of peptic ulcer disease   . Pure hypercholesterolemia   . Unspecified essential hypertension   . External hemorrhoids without mention of complication   . GERD (gastroesophageal reflux disease)   . Gastritis   . Hiatal hernia   . Hypertension     Past Surgical History  Procedure Laterality Date  . Back surgery  1974  . Carpal tunnel release  2/10  . Tonsillectomy      History   Social History  . Marital Status: Widowed    Spouse Name: N/A  . Number of Children: 2  . Years of Education: N/A   Occupational History  . retired   .     Social History Main Topics  . Smoking status: Never Smoker   . Smokeless tobacco: Never Used  . Alcohol Use: No  . Drug Use: No  . Sexual Activity: Not on file   Other Topics Concern  . Not on file   Social History Narrative    Current Outpatient Prescriptions on File Prior to Visit  Medication Sig Dispense Refill  . amLODipine (NORVASC) 5 MG tablet Take 1 tablet (5 mg total) by mouth daily. 30 tablet 3  . Bisacodyl (DULCOLAX PO) Take by mouth. As needed     . cetirizine (ZYRTEC) 10 MG tablet Take 10 mg by mouth daily as needed for allergies.    Marland Kitchen esomeprazole (NEXIUM) 40 MG capsule Take 1 capsule (40 mg total) by mouth 2 (two) times daily. 180 capsule 3  . ezetimibe (ZETIA) 10 MG tablet Take 1 tablet (10 mg total) by mouth daily. 90 tablet 1  .  rosuvastatin (CRESTOR) 5 MG tablet Take 1/2 tablet by mouth daily. 45 tablet 1   No current facility-administered medications on file prior to visit.    Allergies  Allergen Reactions  . Amoxicillin     REACTION: rash  . Metronidazole     REACTION: rash    Family History  Problem Relation Age of Onset  . Colon cancer Neg Hx   . Hypertension Father   . Diabetes Sister   . Hypertension Sister   . Diabetes Brother   . Hypertension Brother   . CAD Mother     Died of MI at age 9  . CAD Brother     Died of MI at age 52  . Stroke Father   . Hypercalcemia Neg Hx     BP 170/104 mmHg  Pulse 73  Temp(Src) 98.6 F (37 C) (Oral)  Ht 5\' 1"  (1.549 m)  Wt 161 lb (73.029 kg)  BMI 30.44 kg/m2  SpO2 98%  Review of Systems Denies hematuria and weight change    Objective:   Physical Exam VITAL SIGNS:  See vs page GENERAL: no distress Ext: 1+ bilat leg edema.   (i reviewed DEXA result) Vit-D=normal Lab Results  Component Value Date   PTH 73* 08/24/2014   CALCIUM 10.9*  08/24/2014      Assessment & Plan:  Vit-D deficiency: better with supplementation.  Pt is advised to d/c Osteoporosis, mild.  We'll follow. HTN: off chlorthalidone, she needs increased rx Primary hyperparathyroidism, mild.  We'll follow.  Patient is advised the following: Patient Instructions  blood tests are being requested for you today.  We'll let you know about the results.   i have sent a prescription to your pharmacy, for a different fluid pill.   Please come back for a follow-up appointment in 3 months.

## 2014-08-25 ENCOUNTER — Other Ambulatory Visit: Payer: Self-pay | Admitting: Endocrinology

## 2014-08-25 LAB — PTH, INTACT AND CALCIUM
CALCIUM: 10.9 mg/dL — AB (ref 8.4–10.5)
PTH: 73 pg/mL — ABNORMAL HIGH (ref 14–64)

## 2014-08-27 ENCOUNTER — Telehealth: Payer: Self-pay | Admitting: Endocrinology

## 2014-08-27 NOTE — Telephone Encounter (Signed)
Patient is returning your call.  

## 2014-08-27 NOTE — Telephone Encounter (Signed)
Contacted pt. Pt wanted to know what her Calcium levels were. Pt advised last lab result was 10.9. Pt voiced understanding.

## 2014-09-07 ENCOUNTER — Telehealth: Payer: Self-pay | Admitting: Family Medicine

## 2014-09-07 DIAGNOSIS — E7800 Pure hypercholesterolemia, unspecified: Secondary | ICD-10-CM

## 2014-09-07 MED ORDER — ROSUVASTATIN CALCIUM 5 MG PO TABS: ORAL_TABLET | ORAL | Status: DC

## 2014-09-07 NOTE — Telephone Encounter (Signed)
Rx done. 

## 2014-09-07 NOTE — Telephone Encounter (Signed)
Pt needs refill crestor 5 mg #90 w/[facility]ills [facility]

## 2014-09-15 ENCOUNTER — Telehealth: Payer: Self-pay | Admitting: Family Medicine

## 2014-09-15 MED ORDER — FUROSEMIDE 20 MG PO TABS: 20.0000 mg | ORAL_TABLET | Freq: Every day | ORAL | Status: DC

## 2014-09-15 MED ORDER — AMLODIPINE BESYLATE 5 MG PO TABS: 5.0000 mg | ORAL_TABLET | Freq: Every day | ORAL | Status: DC

## 2014-09-15 MED ORDER — LISINOPRIL 40 MG PO TABS: 40.0000 mg | ORAL_TABLET | Freq: Every day | ORAL | Status: DC

## 2014-09-15 NOTE — Addendum Note (Signed)
Addended by: Agnes Lawrence on: 09/15/2014 03:10 PM   Modules accepted: Orders

## 2014-09-15 NOTE — Telephone Encounter (Signed)
Ok to send per her request. Though it looks like all were refilled recently? Check with her before refilling. Thanks.

## 2014-09-15 NOTE — Telephone Encounter (Signed)
Rxs done for a 90 day supply on each.

## 2014-09-15 NOTE — Telephone Encounter (Signed)
Pt request refill of the following: lisinopril (PRINIVIL,ZESTRIL) 40 MG tablet  amLODipine (NORVASC) 5 MG tablet  furosemide (LASIX) 20 MG tablet  90 day supply    [facility]acy: [facility]

## 2014-09-15 NOTE — Telephone Encounter (Signed)
Dr Kim-Lisinopril and Amlodipine were prescribed by you but not the Furosemide.  Is this OK to refill?-last given on 4/11 by Dr Loanne Drilling.

## 2014-09-16 ENCOUNTER — Ambulatory Visit (INDEPENDENT_AMBULATORY_CARE_PROVIDER_SITE_OTHER): Payer: Medicare Other | Admitting: Podiatry

## 2014-09-16 ENCOUNTER — Encounter: Payer: Self-pay | Admitting: Podiatry

## 2014-09-16 ENCOUNTER — Ambulatory Visit (INDEPENDENT_AMBULATORY_CARE_PROVIDER_SITE_OTHER): Payer: Medicare Other

## 2014-09-16 VITALS — BP 158/84 | HR 70 | Resp 15

## 2014-09-16 DIAGNOSIS — M722 Plantar fascial fibromatosis: Secondary | ICD-10-CM | POA: Diagnosis not present

## 2014-09-16 DIAGNOSIS — M779 Enthesopathy, unspecified: Secondary | ICD-10-CM

## 2014-09-16 MED ORDER — TRIAMCINOLONE ACETONIDE 10 MG/ML IJ SUSP
10.0000 mg | Freq: Once | INTRAMUSCULAR | Status: AC
  Administered 2014-09-16: 10 mg

## 2014-09-16 NOTE — Progress Notes (Signed)
Subjective:     Patient ID: Robin [name], female   DOB: 10-10-1933, 81 y.o.   MRN: [id number]  HPI patient states the heel is quite a bit improved but still sore somewhat when palpated and there is quite a bit of discomfort which is occurred in the lateral side of the ankle within the sinus tarsi upon palpation   Review of Systems     Objective:   Physical Exam Neurovascular status intact muscle strength adequate with discomfort of the sinus tarsi left that is present and inflamed and painful when palpated    Assessment:     Improving plantar fasciitis left with probable compensatory sinus tarsitis left    Plan:     Injected the sinus tarsi left 3 mg Kenalog 5 mill grams Xylocaine and instructed on physical therapy. Reappoint to recheck

## 2014-10-07 ENCOUNTER — Other Ambulatory Visit: Payer: Self-pay

## 2014-10-07 DIAGNOSIS — Z1231 Encounter for screening mammogram for malignant neoplasm of breast: Secondary | ICD-10-CM

## 2014-11-10 ENCOUNTER — Ambulatory Visit (INDEPENDENT_AMBULATORY_CARE_PROVIDER_SITE_OTHER): Payer: Medicare Other | Admitting: Family Medicine

## 2014-11-10 ENCOUNTER — Encounter: Payer: Self-pay | Admitting: Family Medicine

## 2014-11-10 VITALS — BP 126/80 | HR 86 | Temp 98.1°F | Ht 61.0 in | Wt 165.9 lb

## 2014-11-10 DIAGNOSIS — E7800 Pure hypercholesterolemia, unspecified: Secondary | ICD-10-CM

## 2014-11-10 DIAGNOSIS — I10 Essential (primary) hypertension: Secondary | ICD-10-CM | POA: Diagnosis not present

## 2014-11-10 DIAGNOSIS — E213 Hyperparathyroidism, unspecified: Secondary | ICD-10-CM

## 2014-11-10 DIAGNOSIS — K219 Gastro-esophageal reflux disease without esophagitis: Secondary | ICD-10-CM | POA: Diagnosis not present

## 2014-11-10 LAB — CBC WITH DIFFERENTIAL/PLATELET
BASOS PCT: 0.3 % (ref 0.0–3.0)
Basophils Absolute: 0 10*3/uL (ref 0.0–0.1)
Eosinophils Absolute: 0.2 10*3/uL (ref 0.0–0.7)
Eosinophils Relative: 3.9 % (ref 0.0–5.0)
HCT: 40.9 % (ref 36.0–46.0)
Hemoglobin: 13.6 g/dL (ref 12.0–15.0)
LYMPHS ABS: 1.4 10*3/uL (ref 0.7–4.0)
Lymphocytes Relative: 23.1 % (ref 12.0–46.0)
MCHC: 33.3 g/dL (ref 30.0–36.0)
MCV: 92.4 fl (ref 78.0–100.0)
Monocytes Absolute: 0.5 10*3/uL (ref 0.1–1.0)
Monocytes Relative: 8.4 % (ref 3.0–12.0)
Neutro Abs: 3.8 10*3/uL (ref 1.4–7.7)
Neutrophils Relative %: 64.3 % (ref 43.0–77.0)
Platelets: 279 10*3/uL (ref 150.0–400.0)
RBC: 4.43 Mil/uL (ref 3.87–5.11)
RDW: 13.3 % (ref 11.5–15.5)
WBC: 5.9 10*3/uL (ref 4.0–10.5)

## 2014-11-10 LAB — BASIC METABOLIC PANEL
BUN: 18 mg/dL (ref 6–23)
CHLORIDE: 104 meq/L (ref 96–112)
CO2: 30 meq/L (ref 19–32)
Calcium: 10.4 mg/dL (ref 8.4–10.5)
Creatinine, Ser: 0.87 mg/dL (ref 0.40–1.20)
GFR: 66.78 mL/min (ref >60.00)
Glucose, Bld: 93 mg/dL (ref 70–99)
Potassium: 4 mEq/L (ref 3.5–5.1)
SODIUM: 142 meq/L (ref 135–145)

## 2014-11-10 MED ORDER — EZETIMIBE 10 MG PO TABS: 10.0000 mg | ORAL_TABLET | Freq: Every day | ORAL | Status: DC

## 2014-11-10 NOTE — Addendum Note (Signed)
Addended by: Agnes Lawrence on: 11/10/2014 02:52 PM   Modules accepted: Orders

## 2014-11-10 NOTE — Progress Notes (Signed)
Pre visit review using our clinic review tool, if applicable. No additional management support is needed unless otherwise documented below in the visit note. 

## 2014-11-10 NOTE — Progress Notes (Signed)
HPI:  HTN: -now on lasix 20mg  from endocrine, lisinopril 40mg  -denies: increased swelling, CP, DOE -reports BP runs good at home  HLD: -meds: crestor 2.5mg  daily, zetia 10mg  -hx statin intol  Hypercalcemia -seening endo -reports has follow up with endo -reports told to stop Vit D after last visit  GERD: m,eds: nexium 40mg  -hx hiatal hernia and gastritis    ROS: See pertinent positives and negatives per HPI.  Past Medical History  Diagnosis Date  . Hypertension   . Hyperlipemia   . Hyperparathyroidism     seesing Dr. Dwyane Dee in endocrinology  . External hemorrhoids without mention of complication   . GERD (gastroesophageal reflux disease)     hx PUD, Hiatal hernia  . Colon polyps     tubulovillous adenoma, 2010    Past Surgical History  Procedure Laterality Date  . Back surgery  1974  . Carpal tunnel release  2/10  . Tonsillectomy      Family History  Problem Relation Age of Onset  . Colon cancer Neg Hx   . Hypertension Father   . Diabetes Sister   . Hypertension Sister   . Diabetes Brother   . Hypertension Brother   . CAD Mother     Died of MI at age 36  . CAD Brother     Died of MI at age 88  . Stroke Father   . Hypercalcemia Neg Hx     History   Social History  . Marital Status: Widowed    Spouse Name: N/A  . Number of Children: 2  . Years of Education: N/A   Occupational History  . retired   .     Social History Main Topics  . Smoking status: Never Smoker   . Smokeless tobacco: Never Used  . Alcohol Use: No  . Drug Use: No  . Sexual Activity: Not on file   Other Topics Concern  . None   Social History Narrative     Current outpatient prescriptions:  .  amLODipine (NORVASC) 5 MG tablet, Take 1 tablet (5 mg total) by mouth daily., Disp: 90 tablet, Rfl: 0 .  Bisacodyl (DULCOLAX PO), Take by mouth. As needed , Disp: , Rfl:  .  cetirizine (ZYRTEC) 10 MG tablet, Take 10 mg by mouth daily as needed for allergies., Disp: , Rfl:  .   esomeprazole (NEXIUM) 40 MG capsule, Take 1 capsule (40 mg total) by mouth 2 (two) times daily., Disp: 180 capsule, Rfl: 3 .  ezetimibe (ZETIA) 10 MG tablet, Take 1 tablet (10 mg total) by mouth daily., Disp: 90 tablet, Rfl: 1 .  furosemide (LASIX) 20 MG tablet, Take 1 tablet (20 mg total) by mouth daily., Disp: 90 tablet, Rfl: 0 .  lisinopril (PRINIVIL,ZESTRIL) 40 MG tablet, Take 1 tablet (40 mg total) by mouth daily., Disp: 90 tablet, Rfl: 0 .  rosuvastatin (CRESTOR) 5 MG tablet, Take 1/2 tablet by mouth daily., Disp: 45 tablet, Rfl: 1  EXAM:  Filed Vitals:   11/10/14 0841  BP: 126/80  Pulse: 86  Temp: 98.1 F (36.7 C)    Body mass index is 31.36 kg/(m^2).  GENERAL: vitals reviewed and listed above, alert, oriented, appears well hydrated and in no acute distress  HEENT: atraumatic, conjunttiva clear, no obvious abnormalities on inspection of external nose and ears  NECK: no obvious masses on inspection  LUNGS: clear to auscultation bilaterally, no wheezes, rales or rhonchi, good air movement  CV: HRRR, no peripheral edema  MS: moves all  extremities without noticeable abnormality  PSYCH: pleasant and cooperative, no obvious depression or anxiety  ASSESSMENT AND PLAN:  Discussed the following assessment and plan:  Essential hypertension - Plan: Basic metabolic panel  HYPERCHOLESTEROLEMIA  Gastroesophageal reflux disease, esophagitis presence not specified - Plan: CBC with Differential  Hyperparathyroidism  -basic labs -BP great on recheck and she reports great at home -cont current treatment -Patient advised to return or notify a doctor immediately if symptoms worsen or persist or new concerns arise.  There are no Patient Instructions on file for this visit.   Colin Benton R.

## 2014-11-17 ENCOUNTER — Ambulatory Visit
Admission: RE | Admit: 2014-11-17 | Discharge: 2014-11-17 | Disposition: A | Discharge status: Home / Self Care | Payer: Medicare Other | Source: Ambulatory Visit

## 2014-11-17 DIAGNOSIS — Z1231 Encounter for screening mammogram for malignant neoplasm of breast: Secondary | ICD-10-CM | POA: Diagnosis not present

## 2014-11-24 ENCOUNTER — Encounter: Payer: Self-pay | Admitting: Endocrinology

## 2014-11-24 ENCOUNTER — Ambulatory Visit (INDEPENDENT_AMBULATORY_CARE_PROVIDER_SITE_OTHER): Payer: Medicare Other | Admitting: Endocrinology

## 2014-11-24 ENCOUNTER — Telehealth: Payer: Self-pay | Admitting: Family Medicine

## 2014-11-24 VITALS — BP 130/88 | HR 74 | Temp 98.2°F | Resp 16 | Ht 61.5 in | Wt 167.4 lb

## 2014-11-24 DIAGNOSIS — E213 Hyperparathyroidism, unspecified: Secondary | ICD-10-CM | POA: Diagnosis not present

## 2014-11-24 NOTE — Patient Instructions (Addendum)
Please take non-prescription vitamin-D, 2000 units per day.   Please come back for a follow-up appointment in 6 months.

## 2014-11-24 NOTE — Telephone Encounter (Signed)
Pt has a question about some blood work that was done on 11/10/14.

## 2014-11-24 NOTE — Progress Notes (Signed)
Subjective:    Patient ID: Robin [name], female    DOB: 03-Jun-1933, 81 y.o.   MRN: [id number]  HPI  Pt returns for f/u of hyperparathyroidism (probably combined primary and secondary; first noted in 2009; she has never had urolithiasis, PUD, pancreatitis, depression, or bony fracture).  Denies cramps.   Past Medical History  Diagnosis Date  . Hypertension   . Hyperlipemia   . Hyperparathyroidism     seesing Dr. Dwyane Dee in endocrinology  . External hemorrhoids without mention of complication   . GERD (gastroesophageal reflux disease)     hx PUD, Hiatal hernia  . Colon polyps     tubulovillous adenoma, 2010    Past Surgical History  Procedure Laterality Date  . Back surgery  1974  . Carpal tunnel release  2/10  . Tonsillectomy      History   Social History  . Marital Status: Widowed    Spouse Name: N/A  . Number of Children: 2  . Years of Education: N/A   Occupational History  . retired   .     Social History Main Topics  . Smoking status: Never Smoker   . Smokeless tobacco: Never Used  . Alcohol Use: No  . Drug Use: No  . Sexual Activity: Not on file   Other Topics Concern  . Not on file   Social History Narrative    Current Outpatient Prescriptions on File Prior to Visit  Medication Sig Dispense Refill  . amLODipine (NORVASC) 5 MG tablet Take 1 tablet (5 mg total) by mouth daily. 90 tablet 0  . Bisacodyl (DULCOLAX PO) Take by mouth. As needed     . cetirizine (ZYRTEC) 10 MG tablet Take 10 mg by mouth daily as needed for allergies.    Marland Kitchen esomeprazole (NEXIUM) 40 MG capsule Take 1 capsule (40 mg total) by mouth 2 (two) times daily. 180 capsule 3  . ezetimibe (ZETIA) 10 MG tablet Take 1 tablet (10 mg total) by mouth daily. 90 tablet 1  . furosemide (LASIX) 20 MG tablet Take 1 tablet (20 mg total) by mouth daily. 90 tablet 0  . lisinopril (PRINIVIL,ZESTRIL) 40 MG tablet Take 1 tablet (40 mg total) by mouth daily. 90 tablet 0  . rosuvastatin (CRESTOR) 5 MG  tablet Take 1/2 tablet by mouth daily. 45 tablet 1   No current facility-administered medications on file prior to visit.    Allergies  Allergen Reactions  . Amoxicillin     REACTION: rash  . Metronidazole     REACTION: rash    Family History  Problem Relation Age of Onset  . Colon cancer Neg Hx   . Hypertension Father   . Diabetes Sister   . Hypertension Sister   . Diabetes Brother   . Hypertension Brother   . CAD Mother     Died of MI at age 29  . CAD Brother     Died of MI at age 9  . Stroke Father   . Hypercalcemia Neg Hx     BP 130/88 mmHg  Pulse 74  Temp(Src) 98.2 F (36.8 C) (Oral)  Resp 16  Ht 5' 1.5" (1.562 m)  Wt 167 lb 6.4 oz (75.932 kg)  BMI 31.12 kg/m2  SpO2 97%  Review of Systems Denies falls    Objective:   Physical Exam VITAL SIGNS:  See vs page GENERAL: no distress Spine: no kyphosis.  Ext: trace bilat leg edema   Lab Results  Component Value Date  PTH 73* 08/24/2014   CALCIUM 10.4 11/10/2014      Assessment & Plan:  Vit-D deficiency: she'll need some ongoing supplementation Osteoporosis, mild.  We'll follow.  HTN: well-controlled.  Please continue the same medications.  hyperparathyroidism, mild.  We'll follow for now.    Patient is advised the following: Patient Instructions  Please take non-prescription vitamin-D, 2000 units per day.   Please come back for a follow-up appointment in 6 months.

## 2014-11-25 NOTE — Telephone Encounter (Signed)
I called the pt and she asked if her calcium level was checked on 6/28 as Dr Loanne Drilling told her this was normal and she did not remember [place] telling her about this.  I advised her the level was 10.4 which is normal and she agreed.

## 2014-12-04 ENCOUNTER — Encounter: Payer: Self-pay | Admitting: Gastroenterology

## 2014-12-07 ENCOUNTER — Encounter: Payer: Self-pay | Admitting: Gastroenterology

## 2014-12-08 ENCOUNTER — Telehealth: Payer: Self-pay | Admitting: Family Medicine

## 2014-12-08 MED ORDER — AMLODIPINE BESYLATE 5 MG PO TABS: 5.0000 mg | ORAL_TABLET | Freq: Every day | ORAL | Status: DC

## 2014-12-08 NOTE — Telephone Encounter (Signed)
Rx done. 

## 2014-12-08 NOTE — Telephone Encounter (Signed)
Pt request refill of the following: amLODipine (NORVASC) 5 MG tablet lisinopril (PRINIVIL,ZESTRIL) 40 MG tablet   Pt said 90 day supply   Phamacy: [facility]

## 2014-12-15 ENCOUNTER — Telehealth: Payer: Self-pay | Admitting: Family Medicine

## 2014-12-15 MED ORDER — LISINOPRIL 40 MG PO TABS: 40.0000 mg | ORAL_TABLET | Freq: Every day | ORAL | Status: DC

## 2014-12-15 MED ORDER — FUROSEMIDE 20 MG PO TABS: 20.0000 mg | ORAL_TABLET | Freq: Every day | ORAL | Status: DC

## 2014-12-15 NOTE — Telephone Encounter (Signed)
Rxs done. 

## 2014-12-15 NOTE — Telephone Encounter (Signed)
Pt needs refills on lisinopril 40 mg and furosemide 20 mg #90 each w/refills [facility]

## 2015-01-27 DIAGNOSIS — M1811 Unilateral primary osteoarthritis of first carpometacarpal joint, right hand: Secondary | ICD-10-CM | POA: Diagnosis not present

## 2015-01-27 DIAGNOSIS — M152 Bouchard's nodes (with arthropathy): Secondary | ICD-10-CM | POA: Diagnosis not present

## 2015-01-27 DIAGNOSIS — S43421A Sprain of right rotator cuff capsule, initial encounter: Secondary | ICD-10-CM | POA: Diagnosis not present

## 2015-01-27 DIAGNOSIS — G5601 Carpal tunnel syndrome, right upper limb: Secondary | ICD-10-CM | POA: Diagnosis not present

## 2015-01-27 DIAGNOSIS — M151 Heberden's nodes (with arthropathy): Secondary | ICD-10-CM | POA: Diagnosis not present

## 2015-02-10 DIAGNOSIS — M62422 Contracture of muscle, left upper arm: Secondary | ICD-10-CM | POA: Diagnosis not present

## 2015-02-10 DIAGNOSIS — M25512 Pain in left shoulder: Secondary | ICD-10-CM | POA: Diagnosis not present

## 2015-02-10 DIAGNOSIS — M25612 Stiffness of left shoulder, not elsewhere classified: Secondary | ICD-10-CM | POA: Diagnosis not present

## 2015-02-15 DIAGNOSIS — S43421D Sprain of right rotator cuff capsule, subsequent encounter: Secondary | ICD-10-CM | POA: Diagnosis not present

## 2015-02-15 DIAGNOSIS — M79641 Pain in right hand: Secondary | ICD-10-CM | POA: Diagnosis not present

## 2015-02-15 DIAGNOSIS — G5601 Carpal tunnel syndrome, right upper limb: Secondary | ICD-10-CM | POA: Diagnosis not present

## 2015-02-15 DIAGNOSIS — M1811 Unilateral primary osteoarthritis of first carpometacarpal joint, right hand: Secondary | ICD-10-CM | POA: Diagnosis not present

## 2015-02-16 DIAGNOSIS — H2513 Age-related nuclear cataract, bilateral: Secondary | ICD-10-CM | POA: Diagnosis not present

## 2015-02-16 DIAGNOSIS — H02831 Dermatochalasis of right upper eyelid: Secondary | ICD-10-CM | POA: Diagnosis not present

## 2015-02-16 DIAGNOSIS — H524 Presbyopia: Secondary | ICD-10-CM | POA: Diagnosis not present

## 2015-02-16 DIAGNOSIS — H25013 Cortical age-related cataract, bilateral: Secondary | ICD-10-CM | POA: Diagnosis not present

## 2015-02-17 DIAGNOSIS — M62422 Contracture of muscle, left upper arm: Secondary | ICD-10-CM | POA: Diagnosis not present

## 2015-02-17 DIAGNOSIS — M25612 Stiffness of left shoulder, not elsewhere classified: Secondary | ICD-10-CM | POA: Diagnosis not present

## 2015-02-17 DIAGNOSIS — M25512 Pain in left shoulder: Secondary | ICD-10-CM | POA: Diagnosis not present

## 2015-03-03 ENCOUNTER — Telehealth: Payer: Self-pay | Admitting: Family Medicine

## 2015-03-03 MED ORDER — FUROSEMIDE 20 MG PO TABS: 20.0000 mg | ORAL_TABLET | Freq: Every day | ORAL | Status: DC

## 2015-03-03 MED ORDER — AMLODIPINE BESYLATE 5 MG PO TABS: 5.0000 mg | ORAL_TABLET | Freq: Every day | ORAL | Status: DC

## 2015-03-03 MED ORDER — LISINOPRIL 40 MG PO TABS: 40.0000 mg | ORAL_TABLET | Freq: Every day | ORAL | Status: DC

## 2015-03-03 NOTE — Telephone Encounter (Signed)
Pt request refill  furosemide (LASIX) 20 MG tablet lisinopril (PRINIVIL,ZESTRIL) 40 MG tablet amLODipine (NORVASC) 5 MG tablet 3 mo supply with refills if possible  [facility]

## 2015-03-03 NOTE — Telephone Encounter (Signed)
Rxs done. 

## 2015-03-23 ENCOUNTER — Encounter: Payer: Self-pay | Admitting: Family Medicine

## 2015-03-23 ENCOUNTER — Ambulatory Visit (INDEPENDENT_AMBULATORY_CARE_PROVIDER_SITE_OTHER): Payer: Medicare Other | Admitting: Family Medicine

## 2015-03-23 VITALS — BP 138/84 | HR 74 | Temp 97.8°F | Ht 61.5 in | Wt 149.7 lb

## 2015-03-23 DIAGNOSIS — I10 Essential (primary) hypertension: Secondary | ICD-10-CM | POA: Diagnosis not present

## 2015-03-23 DIAGNOSIS — K219 Gastro-esophageal reflux disease without esophagitis: Secondary | ICD-10-CM | POA: Diagnosis not present

## 2015-03-23 DIAGNOSIS — Z23 Encounter for immunization: Secondary | ICD-10-CM

## 2015-03-23 DIAGNOSIS — E7800 Pure hypercholesterolemia, unspecified: Secondary | ICD-10-CM

## 2015-03-23 DIAGNOSIS — E213 Hyperparathyroidism, unspecified: Secondary | ICD-10-CM

## 2015-03-23 DIAGNOSIS — E559 Vitamin D deficiency, unspecified: Secondary | ICD-10-CM

## 2015-03-23 NOTE — Patient Instructions (Signed)
BEFORE YOU LEAVE: -flu shot -Medicare Wellness exam in 3-4 months - please come fasting  We recommend the following healthy lifestyle measures: - eat a healthy whole foods diet consisting of regular small meals composed of vegetables, fruits, beans, nuts, seeds, healthy meats such as white chicken and fish and whole grains.  - avoid sweets, white starchy foods, fried foods, fast food, processed foods, sodas, red meet and other fattening foods.  - get a least 150-300 minutes of aerobic exercise per week.

## 2015-03-23 NOTE — Progress Notes (Signed)
Pre visit review using our clinic review tool, if applicable. No additional management support is needed unless otherwise documented below in the visit note. 

## 2015-03-23 NOTE — Progress Notes (Signed)
HPI:  HTN: -now on lasix 20mg  from endocrine, lisinopril 40mg  -denies: increased swelling, CP, DOE -reports BP runs good at home -was up a little on arrival which she reports was because she did not take her medications and was stressed that she was going to be late for her appt, better on recheck  HLD: -meds: crestor 2.5mg  daily, zetia 10mg  -hx statin intol  Hyperparathyroidism/Osteoporosis: -seening endo, on Vit D3 -they are monitoring  GERD: -meds: nexium 40mg  -hx hiatal hernia and gastritis  ROS: See pertinent positives and negatives per HPI.  Past Medical History  Diagnosis Date  . Hypertension   . Hyperlipemia   . Hyperparathyroidism [facility])     seeing Dr. Dwyane Dee in endocrinology  . External hemorrhoids without mention of complication   . GERD (gastroesophageal reflux disease)     hx PUD, Hiatal hernia  . Colon polyps     tubulovillous adenoma, 2010  . IBS (irritable bowel syndrome), GERD, hx colon polyps, chronic abd pain - followed by Dr. Sharlett Iles in GI 06/11/2012  . Osteoporosis 08/29/2011    Past Surgical History  Procedure Laterality Date  . Back surgery  1974  . Carpal tunnel release  2/10  . Tonsillectomy      Family History  Problem Relation Age of Onset  . Colon cancer Neg Hx   . Hypertension Father   . Diabetes Sister   . Hypertension Sister   . Diabetes Brother   . Hypertension Brother   . CAD Mother     Died of MI at age 59  . CAD Brother     Died of MI at age 36  . Stroke Father   . Hypercalcemia Neg Hx     Social History   Social History  . Marital Status: Widowed    Spouse Name: N/A  . Number of Children: 2  . Years of Education: N/A   Occupational History  . retired   .     Social History Main Topics  . Smoking status: Never Smoker   . Smokeless tobacco: Never Used  . Alcohol Use: No  . Drug Use: No  . Sexual Activity: Not Asked   Other Topics Concern  . None   Social History Narrative     Current outpatient  prescriptions:  .  amLODipine (NORVASC) 5 MG tablet, Take 1 tablet (5 mg total) by mouth daily., Disp: 90 tablet, Rfl: 0 .  Bisacodyl (DULCOLAX PO), Take by mouth. As needed , Disp: , Rfl:  .  cetirizine (ZYRTEC) 10 MG tablet, Take 10 mg by mouth daily as needed for allergies., Disp: , Rfl:  .  esomeprazole (NEXIUM) 40 MG capsule, Take 1 capsule (40 mg total) by mouth 2 (two) times daily., Disp: 180 capsule, Rfl: 3 .  ezetimibe (ZETIA) 10 MG tablet, Take 1 tablet (10 mg total) by mouth daily., Disp: 90 tablet, Rfl: 1 .  furosemide (LASIX) 20 MG tablet, Take 1 tablet (20 mg total) by mouth daily., Disp: 90 tablet, Rfl: 0 .  lisinopril (PRINIVIL,ZESTRIL) 40 MG tablet, Take 1 tablet (40 mg total) by mouth daily., Disp: 90 tablet, Rfl: 0 .  rosuvastatin (CRESTOR) 5 MG tablet, Take 1/2 tablet by mouth daily., Disp: 45 tablet, Rfl: 1  EXAM:  Filed Vitals:   03/23/15 0857  BP: 138/84  Pulse: 74  Temp: 97.8 F (36.6 C)    Body mass index is 27.83 kg/(m^2).  GENERAL: vitals reviewed and listed above, alert, oriented, appears well hydrated and in no  acute distress  HEENT: atraumatic, conjunttiva clear, no obvious abnormalities on inspection of external nose and ears  NECK: no obvious masses on inspection  LUNGS: clear to auscultation bilaterally, no wheezes, rales or rhonchi, good air movement  CV: HRRR, no peripheral edema  MS: moves all extremities without noticeable abnormality  PSYCH: pleasant and cooperative, no obvious depression or anxiety  ASSESSMENT AND PLAN:  Discussed the following assessment and plan:  Essential hypertension  Gastroesophageal reflux disease, esophagitis presence not specified  HYPERCHOLESTEROLEMIA  Hyperparathyroidism ([place])  Vitamin D deficiency  -flu shot today -doing well, she plans to check vit d and Calcium with endo  -follow up in 3-4 months -Patient advised to return or notify a doctor immediately if symptoms worsen or persist or new  concerns arise.  Patient Instructions  BEFORE YOU LEAVE: -flu shot -Medicare Wellness exam in 3-4 months - please come fasting  We recommend the following healthy lifestyle measures: - eat a healthy whole foods diet consisting of regular small meals composed of vegetables, fruits, beans, nuts, seeds, healthy meats such as white chicken and fish and whole grains.  - avoid sweets, white starchy foods, fried foods, fast food, processed foods, sodas, red meet and other fattening foods.  - get a least 150-300 minutes of aerobic exercise per week.       Colin Benton R.

## 2015-04-01 ENCOUNTER — Telehealth: Payer: Self-pay | Admitting: Family Medicine

## 2015-04-01 ENCOUNTER — Encounter (HOSPITAL_COMMUNITY): Payer: Self-pay | Admitting: Neurology

## 2015-04-01 ENCOUNTER — Emergency Department (HOSPITAL_COMMUNITY)
Admission: EM | Admit: 2015-04-01 | Discharge: 2015-04-01 | Disposition: A | Discharge status: Home / Self Care | Payer: Medicare Other | Attending: Emergency Medicine | Admitting: Emergency Medicine

## 2015-04-01 ENCOUNTER — Emergency Department (HOSPITAL_COMMUNITY): Payer: Medicare Other

## 2015-04-01 ENCOUNTER — Ambulatory Visit: Payer: Medicare Other | Admitting: Family Medicine

## 2015-04-01 DIAGNOSIS — H538 Other visual disturbances: Secondary | ICD-10-CM | POA: Insufficient documentation

## 2015-04-01 DIAGNOSIS — E785 Hyperlipidemia, unspecified: Secondary | ICD-10-CM | POA: Diagnosis not present

## 2015-04-01 DIAGNOSIS — K219 Gastro-esophageal reflux disease without esophagitis: Secondary | ICD-10-CM | POA: Diagnosis not present

## 2015-04-01 DIAGNOSIS — G4489 Other headache syndrome: Secondary | ICD-10-CM | POA: Diagnosis not present

## 2015-04-01 DIAGNOSIS — G8929 Other chronic pain: Secondary | ICD-10-CM | POA: Diagnosis not present

## 2015-04-01 DIAGNOSIS — I10 Essential (primary) hypertension: Secondary | ICD-10-CM | POA: Diagnosis not present

## 2015-04-01 DIAGNOSIS — Z880 Allergy status to penicillin: Secondary | ICD-10-CM | POA: Diagnosis not present

## 2015-04-01 DIAGNOSIS — H539 Unspecified visual disturbance: Secondary | ICD-10-CM

## 2015-04-01 DIAGNOSIS — Z8739 Personal history of other diseases of the musculoskeletal system and connective tissue: Secondary | ICD-10-CM | POA: Insufficient documentation

## 2015-04-01 DIAGNOSIS — Z86010 Personal history of colonic polyps: Secondary | ICD-10-CM | POA: Insufficient documentation

## 2015-04-01 DIAGNOSIS — Z79899 Other long term (current) drug therapy: Secondary | ICD-10-CM | POA: Diagnosis not present

## 2015-04-01 DIAGNOSIS — R42 Dizziness and giddiness: Secondary | ICD-10-CM | POA: Diagnosis not present

## 2015-04-01 LAB — BASIC METABOLIC PANEL
ANION GAP: 8 (ref 5–15)
BUN: 12 mg/dL (ref 6–20)
CHLORIDE: 107 mmol/L (ref 101–111)
CO2: 25 mmol/L (ref 22–32)
Calcium: 10.9 mg/dL — ABNORMAL HIGH (ref 8.9–10.3)
Creatinine, Ser: 0.73 mg/dL (ref 0.44–1.00)
GFR calc Af Amer: >60 mL/min (ref >60)
GFR calc non Af Amer: >60 mL/min (ref >60)
GLUCOSE: 97 mg/dL (ref 65–99)
POTASSIUM: 3.9 mmol/L (ref 3.5–5.1)
Sodium: 140 mmol/L (ref 135–145)

## 2015-04-01 LAB — URINALYSIS W MICROSCOPIC (NOT AT ARMC)
BILIRUBIN URINE: NEGATIVE
Glucose, UA: NEGATIVE mg/dL
HGB URINE DIPSTICK: NEGATIVE
KETONES UR: 40 mg/dL — AB
LEUKOCYTES UA: NEGATIVE
NITRITE: NEGATIVE
PH: 6 (ref 5.0–8.0)
Protein, ur: NEGATIVE mg/dL
RBC / HPF: NONE SEEN RBC/hpf (ref 0–5)
SPECIFIC GRAVITY, URINE: 1.02 (ref 1.005–1.030)

## 2015-04-01 LAB — I-STAT TROPONIN, ED: Troponin i, poc: 0.01 ng/mL (ref 0.00–0.08)

## 2015-04-01 LAB — CBC
HEMATOCRIT: 44 % (ref 36.0–46.0)
HEMOGLOBIN: 15 g/dL (ref 12.0–15.0)
MCH: 31.4 pg (ref 26.0–34.0)
MCHC: 34.1 g/dL (ref 30.0–36.0)
MCV: 92.2 fL (ref 78.0–100.0)
Platelets: 298 10*3/uL (ref 150–400)
RBC: 4.77 MIL/uL (ref 3.87–5.11)
RDW: 12.8 % (ref 11.5–15.5)
WBC: 7.3 10*3/uL (ref 4.0–10.5)

## 2015-04-01 LAB — SEDIMENTATION RATE: Sed Rate: 11 mm/hr (ref 0–22)

## 2015-04-01 MED ORDER — ONDANSETRON HCL 4 MG/2ML IJ SOLN
4.0000 mg | Freq: Once | INTRAMUSCULAR | Status: AC
  Administered 2015-04-01: 4 mg via INTRAVENOUS
  Filled 2015-04-01: qty 2

## 2015-04-01 MED ORDER — MECLIZINE HCL 50 MG PO TABS: 50.0000 mg | ORAL_TABLET | Freq: Three times a day (TID) | ORAL | Status: DC | PRN

## 2015-04-01 MED ORDER — MECLIZINE HCL 25 MG PO TABS
25.0000 mg | ORAL_TABLET | Freq: Once | ORAL | Status: AC
  Administered 2015-04-01: 25 mg via ORAL
  Filled 2015-04-01: qty 1

## 2015-04-01 MED ORDER — ACETAMINOPHEN 325 MG PO TABS
650.0000 mg | ORAL_TABLET | Freq: Once | ORAL | Status: AC
  Administered 2015-04-01: 650 mg via ORAL
  Filled 2015-04-01: qty 2

## 2015-04-01 NOTE — Telephone Encounter (Signed)
Did this pt go to ED? Heard to determine from the note...and I don't see ED note.

## 2015-04-01 NOTE — ED Notes (Signed)
Pt reports episode of vertigo this morning at 0900 coincided with episode of vomiting. Has hx of vertigo, called PCP today and they couldn't see her today but told to come here. At this time reports feeling "foggy" headed but is a x 4. No neuro s/s, moves all extremities., denies cp or sob.

## 2015-04-01 NOTE — Telephone Encounter (Signed)
FYI

## 2015-04-01 NOTE — Telephone Encounter (Signed)
I called the pt and she stated she did not go to the ER as could not sit up straight and she states she is feeling better now.

## 2015-04-01 NOTE — Discharge Instructions (Signed)
Benign Positional Vertigo °Vertigo is the feeling that you or your surroundings are moving when they are not. Benign positional vertigo is the most common form of vertigo. The cause of this condition is not serious (is benign). This condition is triggered by certain movements and positions (is positional). This condition can be dangerous if it occurs while you are doing something that could endanger you or others, such as driving.  °CAUSES °In many cases, the cause of this condition is not known. It may be caused by a disturbance in an area of the inner ear that helps your brain to sense movement and balance. This disturbance can be caused by a viral infection (labyrinthitis), head injury, or repetitive motion. °RISK FACTORS °This condition is more likely to develop in: °· Women. °· People who are 50 years of age or older. °SYMPTOMS °Symptoms of this condition usually happen when you move your head or your eyes in different directions. Symptoms may start suddenly, and they usually last for less than a minute. Symptoms may include: °· Loss of balance and falling. °· Feeling like you are spinning or moving. °· Feeling like your surroundings are spinning or moving. °· Nausea and vomiting. °· Blurred vision. °· Dizziness. °· Involuntary eye movement (nystagmus). °Symptoms can be mild and cause only slight annoyance, or they can be severe and interfere with daily life. Episodes of benign positional vertigo may return (recur) over time, and they may be triggered by certain movements. Symptoms may improve over time. °DIAGNOSIS °This condition is usually diagnosed by medical history and a physical exam of the head, neck, and ears. You may be referred to a health care provider who specializes in ear, nose, and throat (ENT) problems (otolaryngologist) or a provider who specializes in disorders of the nervous system (neurologist). You may have additional testing, including: °· MRI. °· A CT scan. °· Eye movement tests. Your  health care provider may ask you to change positions quickly while he or she watches you for symptoms of benign positional vertigo, such as nystagmus. Eye movement may be tested with an electronystagmogram (ENG), caloric stimulation, the Dix-Hallpike test, or the roll test. °· An electroencephalogram (EEG). This records electrical activity in your brain. °· Hearing tests. °TREATMENT °Usually, your health care provider will treat this by moving your head in specific positions to adjust your inner ear back to normal. Surgery may be needed in severe cases, but this is rare. In some cases, benign positional vertigo may resolve on its own in 2-4 weeks. °HOME CARE INSTRUCTIONS °Safety °· Move slowly. Avoid sudden body or head movements. °· Avoid driving. °· Avoid operating heavy machinery. °· Avoid doing any tasks that would be dangerous to you or others if a vertigo episode would occur. °· If you have trouble walking or keeping your balance, try using a cane for stability. If you feel dizzy or unstable, sit down right away. °· Return to your normal activities as told by your health care provider. Ask your health care provider what activities are safe for you. °General Instructions °· Take over-the-counter and prescription medicines only as told by your health care provider. °· Avoid certain positions or movements as told by your health care provider. °· Drink enough fluid to keep your urine clear or pale yellow. °· Keep all follow-up visits as told by your health care provider. This is important. °SEEK MEDICAL CARE IF: °· You have a fever. °· Your condition gets worse or you develop new symptoms. °· Your family or friends   notice any behavioral changes.  Your nausea or vomiting gets worse.  You have numbness or a "pins and needles" sensation. SEEK IMMEDIATE MEDICAL CARE IF:  You have difficulty speaking or moving.  You are always dizzy.  You faint.  You develop severe headaches.  You have weakness in your  legs or arms.  You have changes in your hearing or vision.  You develop a stiff neck.  You develop sensitivity to light.   This information is not intended to replace advice given to you by your health care provider. Make sure you discuss any questions you have with your health care provider.   Document Released: 02/06/2006 Document Revised: 01/20/2015 Document Reviewed: 08/24/2014 Elsevier Interactive Patient Education 2016 [employer]  Dizziness Dizziness is a common problem. It makes you feel unsteady or lightheaded. You may feel like you are about to pass out (faint). Dizziness can lead to injury if you stumble or fall. Anyone can get dizzy, but dizziness is more common in older adults. This condition can be caused by a number of things, including:  Medicines.  Dehydration.  Illness. HOME CARE Following these instructions may help with your condition: Eating and Drinking  Drink enough fluid to keep your pee (urine) clear or pale yellow. This helps to keep you from getting dehydrated. Try to drink more clear fluids, such as water.  Do not drink alcohol.  Limit how much caffeine you drink or eat if told by your doctor.  Limit how much salt you drink or eat if told by your doctor. Activity  Avoid making quick movements.  When you stand up from sitting in a chair, steady yourself until you feel okay.  In the morning, first sit up on the side of the bed. When you feel okay, stand slowly while you hold onto something. Do this until you know that your balance is fine.  Move your legs often if you need to stand in one place for a long time. Tighten and relax your muscles in your legs while you are standing.  Do not drive or use heavy machinery if you feel dizzy.  Avoid bending down if you feel dizzy. Place items in your home so that they are easy for you to reach without leaning over. Lifestyle  Do not use any tobacco products, including cigarettes, chewing tobacco, or  electronic cigarettes. If you need help quitting, ask your doctor.  Try to lower your stress level, such as with yoga or meditation. Talk with your doctor if you need help. General Instructions  Watch your dizziness for any changes.  Take medicines only as told by your doctor. Talk with your doctor if you think that your dizziness is caused by a medicine that you are taking.  Tell a friend or a family member that you are feeling dizzy. If he or she notices any changes in your behavior, have this person call your doctor.  Keep all follow-up visits as told by your doctor. This is important. GET HELP IF:  Your dizziness does not go away.  Your dizziness or light-headedness gets worse.  You feel sick to your stomach (nauseous).  You have trouble hearing.  You have new symptoms.  You are unsteady on your feet or you feel like the room is spinning. GET HELP RIGHT AWAY IF:  You throw up (vomit) or have diarrhea and are unable to eat or drink anything.  You have trouble:  Talking.  Walking.  Swallowing.  Using your arms, hands, or legs.  You feel generally weak.  You are not thinking clearly or you have trouble forming sentences. It may take a friend or family member to notice this.  You have:  Chest pain.  Pain in your belly (abdomen).  Shortness of breath.  Sweating.  Your vision changes.  You are bleeding.  You have a headache.  You have neck pain or a stiff neck.  You have a fever.   This information is not intended to replace advice given to you by your health care provider. Make sure you discuss any questions you have with your health care provider.   Document Released: 04/20/2011 Document Revised: 09/15/2014 Document Reviewed: 04/27/2014 Elsevier Interactive Patient Education [employer].

## 2015-04-01 NOTE — ED Provider Notes (Signed)
Arrival Date & Time: 04/01/15 & 1611 History   Chief Complaint  Patient presents with  . Dizziness   HPI Robin [name] is a 81 y.o. female who presents with symptomatic endorses of return of her vertiginous episodes. Patient was was later diagnosed in 2011 by neurologist with vertigo. Patient is no history of prior stroke has hyperlipidemia hyperparathyroidism and high blood pressure. No active or known cancer. Patient was treated into 11 with several maneuvers the resolved symptoms and they did not return until September of this year. Patient states that since September patient has had probably 1-3 episodes and this Saturday progressed to severe headache with associated change in vision that is described as "prisms of light" that are in bilateral eye and moved with eye movement and all are scattered throughout her visual fields". Patient states she had no vertiginous symptoms on Saturday however developed worsening headache with actual vertigo and sensation of room spinning today proximal around 9 AM. Patient had emesis 1 and has had nausea since. No chest pain or shortness of breath vision change has been endorsed as painless and patient believes the headache was brought upon by this vision changes.  Past Medical History  I reviewed & agree with nursing's documentation of PMHx, PSHx, SHx & FHx. Past Medical History  Diagnosis Date  . Hypertension   . Hyperlipemia   . Hyperparathyroidism [facility])     seeing Dr. Dwyane Dee in endocrinology  . External hemorrhoids without mention of complication   . GERD (gastroesophageal reflux disease)     hx PUD, Hiatal hernia  . Colon polyps     tubulovillous adenoma, 2010  . IBS (irritable bowel syndrome), GERD, hx colon polyps, chronic abd pain - followed by Dr. Sharlett Iles in GI 06/11/2012  . Osteoporosis 08/29/2011   Past Surgical History  Procedure Laterality Date  . Back surgery  1974  . Carpal tunnel release  2/10  . Tonsillectomy     Social History    Social History  . Marital Status: Widowed    Spouse Name: N/A  . Number of Children: 2  . Years of Education: N/A   Occupational History  . retired   .     Social History Main Topics  . Smoking status: Never Smoker   . Smokeless tobacco: Never Used  . Alcohol Use: No  . Drug Use: No  . Sexual Activity: Not Asked   Other Topics Concern  . None   Social History Narrative   Family History  Problem Relation Age of Onset  . Colon cancer Neg Hx   . Hypertension Father   . Diabetes Sister   . Hypertension Sister   . Diabetes Brother   . Hypertension Brother   . CAD Mother     Died of MI at age 42  . CAD Brother     Died of MI at age 31  . Stroke Father   . Hypercalcemia Neg Hx     Review of Systems   Complete Review of Systems obtained and is negative except as stated in HPI.  Allergies  Amoxicillin and Metronidazole  Home Medications   Prior to Admission medications   Medication Sig Start Date End Date Taking? Authorizing Provider  amLODipine (NORVASC) 5 MG tablet Take 1 tablet (5 mg total) by mouth daily. 03/03/15  Yes Lucretia Kern, DO  Bisacodyl (DULCOLAX PO) Take 5 mg by mouth daily as needed (constipation). As needed   Yes Historical Provider, MD  cetirizine (ZYRTEC) 10 MG tablet  Take 10 mg by mouth daily as needed for allergies.   Yes Historical Provider, MD  esomeprazole (NEXIUM) 40 MG capsule Take 1 capsule (40 mg total) by mouth 2 (two) times daily. Patient taking differently: Take 40 mg by mouth daily as needed (acid reflux, heartburn).  01/13/14 08/15/15 Yes [name], MD  ezetimibe (ZETIA) 10 MG tablet Take 1 tablet (10 mg total) by mouth daily. 11/10/14  Yes Lucretia Kern, DO  furosemide (LASIX) 20 MG tablet Take 1 tablet (20 mg total) by mouth daily. 03/03/15  Yes Lucretia Kern, DO  lisinopril (PRINIVIL,ZESTRIL) 40 MG tablet Take 1 tablet (40 mg total) by mouth daily. 03/03/15  Yes Lucretia Kern, DO  rosuvastatin (CRESTOR) 5 MG tablet Take 1/2 tablet  by mouth daily. Patient taking differently: Take 5 mg by mouth daily. Take 1/2 tablet by mouth daily. 09/07/14  Yes Lucretia Kern, DO  meclizine (ANTIVERT) 50 MG tablet Take 1 tablet (50 mg total) by mouth 3 (three) times daily as needed for dizziness. 04/01/15   Voncille Lo, MD    Physical Exam  Vitals & Nursing notes reviewed. BP 139/76 mmHg  Pulse 58  Temp(Src) 98.2 F (36.8 C) (Oral)  Resp 11  SpO2 86% Physical Exam Vitals & Nursing notes reviewed. CONST: female, in no acute distress. Appears WD/WN & stated age. HEAD: [place]/AT. EYES: PERRL. No conjunctival injection & lids symmetrical. No vertical or multidirectional nystagmus.  No vertical dysconjugate gaze or deviation. Vestibulo-ocular reflex is abnormal. Dix hallpike maneuver induces symptoms. ENMT: External nose & ears atraumatic. MM moist.  Oropharynx w/o swelling or exudates. NECK: Supple, w/o meningismus. Trachea midline w/o JVD. Stridor absent. CVS: S1/S2 audible w/o gallops. Murmur absent. Peripheral pulses 2+ & equal in all extremities. Cap refill < 2 seconds. RESP: Respiratory effort normal. Lungs CTAB, w/o wheeze. GI: Soft, w/o TTP. Guarding & rebound absent. BS normal. BACK: W/o CVA TTP bilaterally. SKIN: Warm & dry, w/o rash. W/o open wound. NEURO: AAOx3. CN II-XIII grossly intact.  Sensation w/o deficit. Strength w/o deficit. Tremor absent. PSYCH:  Cooperative, w/ mood & affect appropriate. MSK: Extremities w/o deformity or TTP.  Joints stable, w/o warmth. W/o cyanosis.  ED Course  Procedures  Labs Review Labs Reviewed  BASIC METABOLIC PANEL - Abnormal; Notable for the following:    Calcium 10.9 (*)    All other components within normal limits  URINALYSIS W MICROSCOPIC (NOT AT [facility]) - Abnormal; Notable for the following:    Ketones, ur 40 (*)    Bacteria, UA RARE (*)    Squamous Epithelial / LPF 0-5 (*)    All other components within normal limits  CBC  SEDIMENTATION RATE  I-STAT TROPOININ, ED     Imaging Review No results found.  I personally visualized all Labs & Imaging results, which were used in the medical decision making of this patient's treatment and disposition.  EKG Interpretation  EKG Interpretation  Date/Time:  Thursday April 01 2015 16:55:36 EST Ventricular Rate:  76 PR Interval:  176 QRS Duration: 80 QT Interval:  380 QTC Calculation: 427 R Axis:   -46 Text Interpretation:  Sinus rhythm with occasional Premature ventricular complexes Left anterior fascicular block Minimal voltage criteria for LVH, may be normal variant Abnormal ECG No significant change since last tracing Confirmed by LITTLE MD, RACHEL [phone]) on 04/01/2015 9:05:48 PM      MDM  Michele Mcalpine is a 81 y.o. female with H&P as above. ED clinical course as follows:  Patient endorses symptoms consistent with Vertigo and has known hx of episodes. DDX includes and considered Central etiologies of vertigo considered include Intracranial bleed including Subarachnoid or Intraparenchymal or Epidural/Subdural Hematoma, CVA/TIA, or vascular Insufficiency, Post-traumatic Head injury, or Intracranial mass/tumor versus Peripheral etiologies of inner ear pathology.   Eye exam reassuring given and also reassuring given vestibulo-ocular reflex is abnormal which points to Peripheral etiology. Vascular Insufficiency and other emergent Intracranial etiologies including bleed, ischemia, mass/tumor unlikely, given Neuro exam without focal neurologic deficits, ataxia, or concerning CN findings. Also doubt Central etiology given symptoms described as intense intermittent episodes associated with position and head movement. Patient visual symptoms consistent with aura given preceding onset of her HAs. Labs unremarkable as is ESR and CT Head w/o. UA unremarkable.  Dix hallpike maneuver induces symptoms. Symptoms resolved with IV antiemetic. Reassessment reveals patient is stable, without distress.  Patient and I had  bedside discussion regarding their ED evaluation. Through shared decision making, follow up agreed upon and patient requests discharge at this time.   Prior to discharge, Both nursing and I confirmed all concerns and questions had been addressed and that patient understands their return precautions that would necessitate immediate return to the ED.  Patient provided meclizine upon discharge for vertiginous symptoms. Side effects and precautions regarding appropriate use discussed. Patient states understanding and requests this medication be provided.   Clinical Impression:  1. Vertigo   2. Abnormal vision   3. Other headache syndrome    Patient care discussed with Dr. Rex Kras, who oversaw their evaluation & treatment & voiced agreement. House Officer: Voncille Lo, MD, Emergency Medicine Resident.  Voncille Lo, MD 04/06/15 [place], MD 04/08/15 [phone]

## 2015-04-01 NOTE — Telephone Encounter (Signed)
Patient Name: Robin [name] DOB: August 13, 1933 Initial Comment Caller states, mother has vertigo , vomiting, dizzy Nurse Assessment Nurse: Vallery Sa, RN, [place] Date/Time (Eastern Time): 04/01/2015 9:50:02 AM Confirm and document reason for call. If symptomatic, describe symptoms. ---Caller states [place] developed Vertigo again with vomiting today. No severe breathing difficulty. No injury in the past week. No unusual bleeding. Has the patient traveled out of the country within the last 30 days? ---No Does the patient have any new or worsening symptoms? ---Yes Will a triage be completed? ---Yes Related visit to physician within the last 2 weeks? ---No Does the PT have any chronic conditions? (i.e. diabetes, asthma, etc.) ---Yes List chronic conditions. ---High Blood Pressure and Cholesterol Is this a behavioral health call? ---No Guidelines Guideline Title Affirmed Question Affirmed Notes Dizziness - Vertigo SEVERE dizziness (vertigo) (e.g., unable to walk without assistance) Final Disposition User Go to ED Now (or PCP triage) Vallery Sa, RN, [place] Referrals [facility] - ED Disagree/Comply: Comply

## 2015-04-01 NOTE — ED Notes (Signed)
MD at bedside. 

## 2015-04-01 NOTE — Telephone Encounter (Signed)
I have spoken with patient and daughter is on the way to take mother to hospital now.

## 2015-04-13 ENCOUNTER — Ambulatory Visit (INDEPENDENT_AMBULATORY_CARE_PROVIDER_SITE_OTHER): Payer: Medicare Other | Admitting: Family Medicine

## 2015-04-13 ENCOUNTER — Encounter: Payer: Self-pay | Admitting: Family Medicine

## 2015-04-13 VITALS — BP 132/76 | HR 81 | Temp 98.4°F | Ht 61.5 in | Wt 149.9 lb

## 2015-04-13 DIAGNOSIS — I10 Essential (primary) hypertension: Secondary | ICD-10-CM

## 2015-04-13 DIAGNOSIS — R42 Dizziness and giddiness: Secondary | ICD-10-CM

## 2015-04-13 DIAGNOSIS — E7800 Pure hypercholesterolemia, unspecified: Secondary | ICD-10-CM

## 2015-04-13 DIAGNOSIS — R519 Headache, unspecified: Secondary | ICD-10-CM

## 2015-04-13 DIAGNOSIS — R51 Headache: Secondary | ICD-10-CM

## 2015-04-13 NOTE — Progress Notes (Signed)
HPI:  Follow up vertigo: -pt called nurse 11/17 with acute onset severe nausea, vomiting, "prisms of light", HA, weakness - unable to stand up and appropriately was triaged to the ER -pt had eval in ED with CT and labs and this was felt to be her recurrent vertigo possible HA syndrome after discussion with neurology per notes -reports today: doing much better with symptoms all resolved, of note reports remote hx headaches and vertigo and reports saw a neurologist in 2011 about this -denies: HA, malaise, persistent vertigo, weakness, numbness  ROS: See pertinent positives and negatives per HPI.  Past Medical History  Diagnosis Date  . Hypertension   . Hyperlipemia   . Hyperparathyroidism [facility])     seeing Dr. Dwyane Dee in endocrinology  . External hemorrhoids without mention of complication   . GERD (gastroesophageal reflux disease)     hx PUD, Hiatal hernia  . Colon polyps     tubulovillous adenoma, 2010  . IBS (irritable bowel syndrome), GERD, hx colon polyps, chronic abd pain - followed by Dr. Sharlett Iles in GI 06/11/2012  . Osteoporosis 08/29/2011  . Vertigo     eval with neuro in 2011, brief recurrence 2016  . Headache     eval with neuro in 2011    Past Surgical History  Procedure Laterality Date  . Back surgery  1974  . Carpal tunnel release  2/10  . Tonsillectomy      Family History  Problem Relation Age of Onset  . Colon cancer Neg Hx   . Hypertension Father   . Diabetes Sister   . Hypertension Sister   . Diabetes Brother   . Hypertension Brother   . CAD Mother     Died of MI at age 26  . CAD Brother     Died of MI at age 10  . Stroke Father   . Hypercalcemia Neg Hx     Social History   Social History  . Marital Status: Widowed    Spouse Name: N/A  . Number of Children: 2  . Years of Education: N/A   Occupational History  . retired   .     Social History Main Topics  . Smoking status: Never Smoker   . Smokeless tobacco: Never Used  . Alcohol Use: No   . Drug Use: No  . Sexual Activity: Not Asked   Other Topics Concern  . None   Social History Narrative     Current outpatient prescriptions:  .  amLODipine (NORVASC) 5 MG tablet, Take 1 tablet (5 mg total) by mouth daily., Disp: 90 tablet, Rfl: 0 .  Bisacodyl (DULCOLAX PO), Take 5 mg by mouth daily as needed (constipation). As needed, Disp: , Rfl:  .  cetirizine (ZYRTEC) 10 MG tablet, Take 10 mg by mouth daily as needed for allergies., Disp: , Rfl:  .  esomeprazole (NEXIUM) 40 MG capsule, Take 1 capsule (40 mg total) by mouth 2 (two) times daily. (Patient taking differently: Take 40 mg by mouth daily as needed (acid reflux, heartburn). ), Disp: 180 capsule, Rfl: 3 .  ezetimibe (ZETIA) 10 MG tablet, Take 1 tablet (10 mg total) by mouth daily., Disp: 90 tablet, Rfl: 1 .  furosemide (LASIX) 20 MG tablet, Take 1 tablet (20 mg total) by mouth daily., Disp: 90 tablet, Rfl: 0 .  lisinopril (PRINIVIL,ZESTRIL) 40 MG tablet, Take 1 tablet (40 mg total) by mouth daily., Disp: 90 tablet, Rfl: 0 .  meclizine (ANTIVERT) 50 MG tablet, Take 1 tablet (  50 mg total) by mouth 3 (three) times daily as needed for dizziness., Disp: 30 tablet, Rfl: 0 .  rosuvastatin (CRESTOR) 5 MG tablet, Take 1/2 tablet by mouth daily. (Patient taking differently: Take 5 mg by mouth daily. Take 1/2 tablet by mouth daily.), Disp: 45 tablet, Rfl: 1  EXAM:  Filed Vitals:   04/13/15 1444  BP: 132/76  Pulse: 81  Temp: 98.4 F (36.9 C)    Body mass index is 27.87 kg/(m^2).  GENERAL: vitals reviewed and listed above, alert, oriented, appears well hydrated and in no acute distress  HEENT: atraumatic, conjunttiva clear, no obvious abnormalities on inspection of external nose and ears  NECK: no obvious masses on inspection  LUNGS: clear to auscultation bilaterally, no wheezes, rales or rhonchi, good air movement  CV: HRRR, no peripheral edema  MS: moves all extremities without noticeable abnormality  PSYCH: pleasant  and cooperative, no obvious depression or anxiety  NEURO: CN I-XII grossly intact, finger to nose normal, speech, thought processing and vision grossly intact, gait normal  ASSESSMENT AND PLAN:  Discussed the following assessment and plan:  Vertigo  Nonintractable headache, unspecified chronicity pattern, unspecified headache type  Essential hypertension  HYPERCHOLESTEROLEMIA  -> 30 minutes spent in this visit with > 50% face to face with pt in consultation -symptoms resolved and is doing well - query atypical migraine -reviewed CT findings and labs with pt -discussed various etiologies of her prior symptoms -she is feeling great now so has opted not to persue any further eval at this time -CV risks reduction discussed -advised neuro and or optho eval if any further symptoms -Patient advised to return or notify a doctor immediately if symptoms worsen or persist or new concerns arise.  There are no Patient Instructions on file for this visit.   Colin Benton R.

## 2015-04-13 NOTE — Progress Notes (Signed)
Pre visit review using our clinic review tool, if applicable. No additional management support is needed unless otherwise documented below in the visit note. 

## 2015-05-27 ENCOUNTER — Ambulatory Visit: Payer: Medicare Other | Admitting: Endocrinology

## 2015-05-31 ENCOUNTER — Telehealth: Payer: Self-pay | Admitting: Family Medicine

## 2015-05-31 DIAGNOSIS — E7800 Pure hypercholesterolemia, unspecified: Secondary | ICD-10-CM

## 2015-05-31 MED ORDER — ROSUVASTATIN CALCIUM 5 MG PO TABS: ORAL_TABLET | ORAL | Status: DC

## 2015-05-31 MED ORDER — AMLODIPINE BESYLATE 5 MG PO TABS: 5.0000 mg | ORAL_TABLET | Freq: Every day | ORAL | Status: DC

## 2015-05-31 MED ORDER — LISINOPRIL 40 MG PO TABS: 40.0000 mg | ORAL_TABLET | Freq: Every day | ORAL | Status: DC

## 2015-05-31 MED ORDER — FUROSEMIDE 20 MG PO TABS: 20.0000 mg | ORAL_TABLET | Freq: Every day | ORAL | Status: DC

## 2015-05-31 NOTE — Telephone Encounter (Signed)
I spoke with the pt and she requested refills.  She is aware the Rxs were sent to her mail order pharmacy.

## 2015-05-31 NOTE — Telephone Encounter (Signed)
I left a message for the pt to return my call. 

## 2015-05-31 NOTE — Telephone Encounter (Signed)
Pt would like a call back said Dr Maudie Mercury told her to speak with you when she needed to get a refill. She would not give me the information

## 2015-07-01 ENCOUNTER — Encounter: Payer: Self-pay | Admitting: Family Medicine

## 2015-07-01 ENCOUNTER — Ambulatory Visit (INDEPENDENT_AMBULATORY_CARE_PROVIDER_SITE_OTHER): Payer: Medicare Other | Admitting: Family Medicine

## 2015-07-01 VITALS — BP 160/82 | HR 75 | Temp 97.9°F | Ht 61.5 in | Wt 145.5 lb

## 2015-07-01 DIAGNOSIS — IMO0002 Reserved for concepts with insufficient information to code with codable children: Secondary | ICD-10-CM

## 2015-07-01 DIAGNOSIS — I10 Essential (primary) hypertension: Secondary | ICD-10-CM

## 2015-07-01 DIAGNOSIS — H8110 Benign paroxysmal vertigo, unspecified ear: Secondary | ICD-10-CM

## 2015-07-01 DIAGNOSIS — E213 Hyperparathyroidism, unspecified: Secondary | ICD-10-CM | POA: Diagnosis not present

## 2015-07-01 NOTE — Addendum Note (Signed)
Addended by: Lahoma Crocker A on: 07/01/2015 10:00 AM   Modules accepted: Orders

## 2015-07-01 NOTE — Patient Instructions (Signed)
Before you leave: Schedule follow up in 1 month Appointment details for vestibular rehabilitation  Monitor your blood pressure at home and let me know if any values over 150 on the top or 90 on the bottom.  Meclizine as needed for vertigo along with the vestibular rehabilitation. No driving until vertigo has resolved.  He schedule follow-up with her endocrinologist.  Seek care immediately if worsening or new symptoms should arise.

## 2015-07-01 NOTE — Progress Notes (Signed)
HPI:  Robin [name] is a pleasant 82 yo with a PMH sig for HTN, HLD, Hyperparathyroidism, GERD, IBS and vertigo, here for an acute visit for:  Vertigo: -hx of the same with reported eval with neurologist in the past and dx positional vertigo, recurrence several months ago with repeat neuroimaging and neuro consult in the emergency room and felt to be recurrent vertigo and atypical headache - symptoms resolved and she declined any further evaluation outpt  -now reports: Mild vertigo at times since, and more intense vertigo that started suddenly 2 days ago. Reports brief episodes of vertigo that occur with sudden movements, sometimes associated with mild nausea. Meclizine is helpful. -Reports vestibular rehabilitation was helpful several years ago -Denies headache, chest pain, shortness of breath palpitations, vision changes   ROS: See pertinent positives and negatives per HPI.  Past Medical History  Diagnosis Date  . Hypertension   . Hyperlipemia   . Hyperparathyroidism [facility])     seeing Dr. Dwyane Dee in endocrinology  . External hemorrhoids without mention of complication   . GERD (gastroesophageal reflux disease)     hx PUD, Hiatal hernia  . Colon polyps     tubulovillous adenoma, 2010  . IBS (irritable bowel syndrome), GERD, hx colon polyps, chronic abd pain - followed by Dr. Sharlett Iles in GI 06/11/2012  . Osteoporosis 08/29/2011  . Vertigo     eval with neuro in 2011, brief recurrence 2016  . Headache     eval with neuro in 2011    Past Surgical History  Procedure Laterality Date  . Back surgery  1974  . Carpal tunnel release  2/10  . Tonsillectomy      Family History  Problem Relation Age of Onset  . Colon cancer Neg Hx   . Hypertension Father   . Diabetes Sister   . Hypertension Sister   . Diabetes Brother   . Hypertension Brother   . CAD Mother     Died of MI at age 10  . CAD Brother     Died of MI at age 45  . Stroke Father   . Hypercalcemia Neg Hx     Social  History   Social History  . Marital Status: Widowed    Spouse Name: N/A  . Number of Children: 2  . Years of Education: N/A   Occupational History  . retired   .     Social History Main Topics  . Smoking status: Never Smoker   . Smokeless tobacco: Never Used  . Alcohol Use: No  . Drug Use: No  . Sexual Activity: Not Asked   Other Topics Concern  . None   Social History Narrative     Current outpatient prescriptions:  .  amLODipine (NORVASC) 5 MG tablet, Take 1 tablet (5 mg total) by mouth daily., Disp: 90 tablet, Rfl: 1 .  Bisacodyl (DULCOLAX PO), Take 5 mg by mouth daily as needed (constipation). As needed, Disp: , Rfl:  .  cetirizine (ZYRTEC) 10 MG tablet, Take 10 mg by mouth daily as needed for allergies., Disp: , Rfl:  .  esomeprazole (NEXIUM) 40 MG capsule, Take 1 capsule (40 mg total) by mouth 2 (two) times daily. (Patient taking differently: Take 40 mg by mouth daily as needed (acid reflux, heartburn). ), Disp: 180 capsule, Rfl: 3 .  ezetimibe (ZETIA) 10 MG tablet, Take 1 tablet (10 mg total) by mouth daily., Disp: 90 tablet, Rfl: 1 .  furosemide (LASIX) 20 MG tablet, Take 1 tablet (20  mg total) by mouth daily., Disp: 90 tablet, Rfl: 1 .  lisinopril (PRINIVIL,ZESTRIL) 40 MG tablet, Take 1 tablet (40 mg total) by mouth daily., Disp: 90 tablet, Rfl: 1 .  meclizine (ANTIVERT) 50 MG tablet, Take 1 tablet (50 mg total) by mouth 3 (three) times daily as needed for dizziness., Disp: 30 tablet, Rfl: 0 .  rosuvastatin (CRESTOR) 5 MG tablet, Take 1/2 tablet by mouth daily., Disp: 45 tablet, Rfl: 1  EXAM:  Filed Vitals:   07/01/15 0909 07/01/15 0928  BP: 138/82 160/82  Pulse: 75   Temp: 97.9 F (36.6 C)     Body mass index is 27.05 kg/(m^2).  GENERAL: vitals reviewed and listed above, alert, oriented, appears well hydrated and in no acute distress  HEENT: atraumatic, conjunttiva clear, no obvious abnormalities on inspection of external nose and ears, normal appearance  of ear canals and TMs, clear nasal congestion, mild post oropharyngeal erythema with PND, no tonsillar edema or exudate, no sinus TTP  NECK: no obvious masses on inspection  LUNGS: clear to auscultation bilaterally, no wheezes, rales or rhonchi, good air movement  CV: HRRR, no peripheral edema, no carotid bruit  MS: moves all extremities without noticeable abnormality  NEURO/PSYCH: pleasant and cooperative, no obvious depression or anxiety, normal but cautious gate, cranial nerves II through XII grossly intact, finger-to-nose normal, speech and thought processing grossly intact, positive Dix Hallpike to the right  ASSESSMENT AND PLAN:  Discussed the following assessment and plan:  Positional vertigo, unspecified laterality - Plan: PT vestibular rehab  Essential hypertension  Hyperparathyroidism ([place])  -we discussed possible serious and likely etiologies, workup and treatment, treatment risks and return precautions - hx and exam strongly suggestive of BPPVq -after this discussion, Marguerete opted for: -Vestibular rehabilitation -Refill of meclizine -Advised reevaluation with neurologist if persists or worsens -follow up with endocrine advised regarding her hyperparathyroidism -blood pressure was elevated on arrival, much better on recheck and she reports she checks daily at home with values under 130/80 -Patient advised to return or notify a doctor immediately if symptoms worsen or persist or new concerns arise.  Patient Instructions  Before you leave: Schedule follow up in 1 month Appointment details for vestibular rehabilitation  Monitor your blood pressure at home and let me know if any values over 150 on the top or 90 on the bottom.  Meclizine as needed for vertigo along with the vestibular rehabilitation. No driving until vertigo has resolved.  He schedule follow-up with her endocrinologist.  Seek care immediately if worsening or new symptoms should arise.     Colin Benton R.  , No carotid bruits

## 2015-07-01 NOTE — Progress Notes (Signed)
Pre visit review using our clinic review tool, if applicable. No additional management support is needed unless otherwise documented below in the visit note. 

## 2015-07-02 ENCOUNTER — Encounter: Payer: Self-pay | Admitting: Physical Therapy

## 2015-07-02 ENCOUNTER — Ambulatory Visit: Payer: Medicare Other | Attending: Family Medicine | Admitting: Physical Therapy

## 2015-07-02 DIAGNOSIS — R2681 Unsteadiness on feet: Secondary | ICD-10-CM

## 2015-07-02 DIAGNOSIS — M436 Torticollis: Secondary | ICD-10-CM | POA: Diagnosis not present

## 2015-07-02 DIAGNOSIS — R269 Unspecified abnormalities of gait and mobility: Secondary | ICD-10-CM | POA: Diagnosis not present

## 2015-07-02 DIAGNOSIS — H8110 Benign paroxysmal vertigo, unspecified ear: Secondary | ICD-10-CM

## 2015-07-02 DIAGNOSIS — R42 Dizziness and giddiness: Secondary | ICD-10-CM | POA: Insufficient documentation

## 2015-07-02 NOTE — Therapy (Signed)
[facility] [address] [place], [place], [address] Phone: [phone]   Fax:  [phone]  Physical Therapy Evaluation  Patient Details  Name: Robin [name] MRN: [id number] Date of Birth: [birth year] Referring Provider: Colin Benton  Encounter Date: 07/02/2015      PT End of Session - 07/02/15 1203    Visit Number 1   Number of Visits 9  eval + 8 visits   Date for PT Re-Evaluation 08/01/15   Authorization Type Medicare primary; BCBS secondary.  G Codes required   PT Start Time [phone]   PT Stop Time 0935   PT Time Calculation (min) 44 min   Activity Tolerance Patient tolerated treatment well   Behavior During Therapy [facility] for tasks assessed/performed      Past Medical History  Diagnosis Date  . Hypertension   . Hyperlipemia   . Hyperparathyroidism [facility])     seeing Dr. Dwyane Dee in endocrinology  . External hemorrhoids without mention of complication   . GERD (gastroesophageal reflux disease)     hx PUD, Hiatal hernia  . Colon polyps     tubulovillous adenoma, 2010  . IBS (irritable bowel syndrome), GERD, hx colon polyps, chronic abd pain - followed by Dr. Sharlett Iles in GI 06/11/2012  . Osteoporosis 08/29/2011  . Vertigo     eval with neuro in 2011, brief recurrence 2016  . Headache     eval with neuro in 2011    Past Surgical History  Procedure Laterality Date  . Back surgery  1974  . Carpal tunnel release  2/10  . Tonsillectomy      There were no vitals filed for this visit.  Visit Diagnosis:  BPPV (benign paroxysmal positional vertigo), unspecified laterality - Plan: PT plan of care cert/re-cert  Dizziness and giddiness - Plan: PT plan of care cert/re-cert  Abnormality of gait - Plan: PT plan of care cert/re-cert  Unsteadiness - Plan: PT plan of care cert/re-cert      Subjective Assessment - 07/02/15 0859    Subjective Pt had vertigo in 2011; was treated and resolved. Pt reports onset of vertigo on  04/01/15; pt seen in ED but not admitted. CT negative and pt given meclizine and sent home. Pt has not taken meclizine in past 2 days.   Pertinent History PMH: HTN, HLD, GERD, hyperparathyroidism   Patient Stated Goals "I want to get rid of the dizziness."   Currently in Pain? Yes   Pain Score 8    Pain Location Head   Pain Orientation Other (Comment)  top/center of head   Pain Descriptors / Indicators Headache   Pain Type Acute pain   Pain Frequency Intermittent   Aggravating Factors  Pt unsure; started in the car on the way here   Pain Relieving Factors Pt unsure   Multiple Pain Sites No            [facility] PT Assessment - 07/02/15 0001    Assessment   Medical Diagnosis vertigo   Referring Provider Colin Benton   Onset Date/Surgical Date 04/01/15   Precautions   Precautions Fall   Restrictions   Weight Bearing Restrictions No   Balance Screen   Has the patient fallen in the past 6 months No   Has the patient had a decrease in activity level because of a fear of falling?  Yes   Is the patient reluctant to leave their home because of a fear of falling?  Yes   Home Environment  Living Environment Private residence   Living Arrangements Alone   Available Help at Discharge Family  son and daughter live closeby   Type of [place] to enter   Entrance Stairs-Number of Steps 2   [place] One level   [place] - 4 wheels;Cane - quad   Prior Function   Level of Independence Independent   Cognition   Overall Cognitive Status Within Functional Limits for tasks assessed   Ambulation/Gait   Ambulation/Gait Yes   Ambulation/Gait Assistance 4: Min assist   Ambulation/Gait Assistance Details Requires min A, HHA for all standing, transfers, and ambulation due to significant imbalance, dizziness. Noted pt holding onto walls, furniture on way into clinic.   Ambulation Distance (Feet) 200 Feet   Assistive device None    Gait Pattern Step-through pattern;Decreased arm swing - right;Decreased arm swing - left;Wide base of support;Decreased stride length;Decreased hip/knee flexion - right;Decreased hip/knee flexion - left;Decreased trunk rotation  UE's in high guard position; holding onto furniture   Ambulation Surface Level;Indoor            Vestibular Assessment - 07/02/15 0001    Vestibular Assessment   General Observation Primary spinning dizziness when lying in bed; secondary imbalance when standing, walking, moving head. Violent episode of BPPV on 04/01/15. Symptoms initially improved but have worsened over past few weeks.   Symptom Behavior   Type of Dizziness Spinning  and secondary imbalance   Frequency of Dizziness several times per week   Duration of Dizziness > 1 minute   Aggravating Factors Activity in general;Lying supine   Occulomotor Exam   Occulomotor Alignment Normal   Spontaneous Absent   Gaze-induced Left beating nystagmus with L gaze  symptomatic   Smooth Pursuits Intact   Comment Convergence appears WNL.    Vestibulo-Occular Reflex   VOR 1 Head Only (x 1 viewing) Pt unable to perform ddue to significant dizziness.   Positional Testing   Sidelying Test Sidelying Left   Horizontal Canal Testing --  NT but noted R-beating horizontal nyst. in R sidelying   Sidelying Left   Sidelying Left Duration > 1 minute (until position changed).   Sidelying Left Symptoms Upbeat, left rotatory nystagmus                Vestibular Treatment/Exercise - 07/02/15 0001    Vestibular Treatment/Exercise   Vestibular Treatment Provided Canalith Repositioning   Canalith Repositioning Semont Procedure Left Posterior;Epley Manuever Left    EPLEY MANUEVER LEFT   Number of Reps  1   Overall Response  Improved Symptoms    RESPONSE DETAILS LEFT After L Epley x1, pt reported improved symptoms but stated, "My head feels funny. It's hard to tell if I'm better because of that headache I came in  with." Unable to formally assess posterior canal due to time constraint.   Semont Procedure Left Posterior   Number of Reps  1   Overall Response Improved Symptoms   Response Details  Performed Semont for L posterior canal. Upon reassessing L Sidelying Test, noted L upbeating torsional nystagmus for approx. 30 sec duration with brief latency. Then, performed L Epley x1 trial; see above.               PT Education - 07/02/15 1203    Education provided Yes   Education Details PT eval findings, goals, and POC. Recommending pt use RW for all mobility at this time.   Person(s)  Educated Patient   Methods Explanation   Comprehension Verbalized understanding          PT Short Term Goals - 2015-07-09 08-22-06    PT SHORT TERM GOAL #1   Title STG's = LTG's           PT Long Term Goals - Jul 09, 2015 1222    PT LONG TERM GOAL #1   Title Pt will independently perform HEP to maximize functional gains made in PT.   (Target date: 07/30/15)   PT LONG TERM GOAL #2   Title Positional vertigo testing will be negative to indicate resolved BPPV.    (Target date: 07/30/15)   PT LONG TERM GOAL #3   Title Pt will decrease DHI score from 68 to < / = 56 to indicate significant decrease in pt-perceived disability due to dizziness.   (Target date: 07/30/15)   PT LONG TERM GOAL #4   Title Assess strength and balance, if indicated, after vertigo clears.    (Target date: 07/30/15)               Plan - 07-09-2015 1225    Clinical Impression Statement Pt is a 82 y/o F referred to outpatient neuro PT to address vertigo. PMH significant for: PMH: HTN, HLD, GERD, hyperparathyroidism, BPPV 2009/08/21), and Vitamin D deficiency.  PT evaluation reveals the following: decreased stability/independence with all aspects of functional mobility due to vertiginous symptoms and imbalance; L gaze-evoked nystagmus;  L upbeating, torsional nystagmus accompanied by concordant vertiginous symptoms on L Sidelying Test; R-beating  horizontal nystagmus in R sidelying (however; R horizontal canal not formally assessed during this session). Based on findings, unable to rule out L posterior canal cupulolithiasis and R horizontal canal BPPV. Therefore, performed Semont Maneuver for L posterior canal. Upon reassessing L Sidelying Test, noted L-upbeating torsional nystagmus ~30 sec with latency. Therefore, performed L Epley Maneuver x1. Unable to reassess due to time-constaint; however, pt noted impovement (but not resolve) in symptoms. Pt will benefit from vestibular PT 2x/week for 4 weeks to address functional impairments associated with vertigo.    Pt will benefit from skilled therapeutic intervention in order to improve on the following deficits Abnormal gait;Decreased knowledge of use of DME;Dizziness;Pain;Decreased balance   Rehab Potential Good   Clinical Impairments Affecting Rehab Potential presentation on evaluation (unable to rule out multi-canal BPPV)   PT Frequency 2x / week   PT Duration 4 weeks   PT Treatment/Interventions ADLs/Self Care Home Management;Gait training;Stair training;Functional mobility training;Therapeutic activities;Therapeutic exercise;Balance training;Neuromuscular re-education;Manual techniques;Vestibular;Canalith Repostioning;DME Instruction   PT Next Visit Plan Reassess for BPPV and treat accordingly. Initiate HEP.   Consulted and Agree with Plan of Care Patient          G-Codes - 2015-07-09 1206-08-22    Functional Assessment Tool Used DHI = 68   Functional Limitation Self care   Self Care Current Status [phone]) At least 60 percent but less than 80 percent impaired, limited or restricted   Self Care Goal Status [id number]) At least 40 percent but less than 60 percent impaired, limited or restricted       Problem List Patient Active Problem List   Diagnosis Date Noted  . Hyperparathyroidism ([place]) 08/24/2014  . Slow transit constipation 01/13/2014  . IBS (irritable bowel syndrome), GERD, hx colon  polyps, chronic abd pain - followed by Dr. Sharlett Iles in GI 06/11/2012  . Osteoporosis 08/29/2011  . Vitamin D deficiency 08/29/2011  . GERD (gastroesophageal reflux disease) 11/23/2010  . HYPERCHOLESTEROLEMIA 09/09/2007  .  Essential hypertension 09/09/2007    Billie Ruddy, PT, DPT [facility] [address] [place], [place], [address] Phone: [phone]   Fax:  [phone] 07/02/2015, 12:46 PM   Name: Kashanti Olshansky MRN: [id number] Date of Birth: 12-Jul-1933

## 2015-07-05 ENCOUNTER — Ambulatory Visit: Payer: Medicare Other | Admitting: Physical Therapy

## 2015-07-05 DIAGNOSIS — R269 Unspecified abnormalities of gait and mobility: Secondary | ICD-10-CM | POA: Diagnosis not present

## 2015-07-05 DIAGNOSIS — R42 Dizziness and giddiness: Secondary | ICD-10-CM

## 2015-07-05 DIAGNOSIS — R2681 Unsteadiness on feet: Secondary | ICD-10-CM | POA: Diagnosis not present

## 2015-07-05 DIAGNOSIS — M436 Torticollis: Secondary | ICD-10-CM | POA: Diagnosis not present

## 2015-07-05 DIAGNOSIS — H8110 Benign paroxysmal vertigo, unspecified ear: Secondary | ICD-10-CM | POA: Diagnosis not present

## 2015-07-05 NOTE — Therapy (Signed)
[facility] [address] [place], [place], [address] Phone: [phone]   Fax:  [phone]  Physical Therapy Treatment  Patient Details  Name: Robin [name] MRN: [id number] Date of Birth: 07-25-1933 Referring Provider: Colin Benton  Encounter Date: 07/05/2015      PT End of Session - 07/05/15 0945    Visit Number 2   Number of Visits 9   Date for PT Re-Evaluation 08/01/15   Authorization Type Medicare primary; BCBS secondary.  G Codes required   PT Start Time [phone]   PT Stop Time 0930   PT Time Calculation (min) 44 min   Activity Tolerance Patient tolerated treatment well;Other (comment)  Required rest after L Roll Test due to nausea   Behavior During Therapy [facility] for tasks assessed/performed      Past Medical History  Diagnosis Date  . Hypertension   . Hyperlipemia   . Hyperparathyroidism [facility])     seeing Dr. Dwyane Dee in endocrinology  . External hemorrhoids without mention of complication   . GERD (gastroesophageal reflux disease)     hx PUD, Hiatal hernia  . Colon polyps     tubulovillous adenoma, 2010  . IBS (irritable bowel syndrome), GERD, hx colon polyps, chronic abd pain - followed by Dr. Sharlett Iles in GI 06/11/2012  . Osteoporosis 08/29/2011  . Vertigo     eval with neuro in 2011, brief recurrence 2016  . Headache     eval with neuro in 2011    Past Surgical History  Procedure Laterality Date  . Back surgery  1974  . Carpal tunnel release  2/10  . Tonsillectomy      There were no vitals filed for this visit.  Visit Diagnosis:  BPPV (benign paroxysmal positional vertigo), unspecified laterality  Dizziness and giddiness      Subjective Assessment - 07/05/15 0852    Subjective Pt reports feeling "much better, but still woozy," per pt. Pt reports no room-spinning dizziness but states, "It still feels like something's not quite right." Pt has not had headache since Friday. Pt reports ongoing  perception of feeling like "there's a band around my head."   Pertinent History PMH: HTN, HLD, GERD, hyperparathyroidism   Patient Stated Goals "I want to get rid of the dizziness."   Currently in Pain? No/denies                Vestibular Assessment - 07/05/15 0001    Vestibular Assessment   General Observation Pt reports ongoing imbalance and pt perception that "something's not quite right." Pt reports headache has resolved but still feels like "there's a band around the top my head."   Symptom Behavior   Type of Dizziness "Funny feeling in head"  and imbalance   Positional Testing   Dix-Hallpike Dix-Hallpike Right;Dix-Hallpike Left   Horizontal Canal Testing Horizontal Canal Right;Horizontal Canal Left;Horizontal Canal Right Intensity;Horizontal Canal Left Intensity   Dix-Hallpike Right   Dix-Hallpike Right Duration NA   Dix-Hallpike Right Symptoms No nystagmus   Dix-Hallpike Left   Dix-Hallpike Left Duration < 10 sec   Dix-Hallpike Left Symptoms Upbeat, left rotatory nystagmus   Horizontal Canal Right   Horizontal Canal Right Duration Approx. 5 seconds of pt-reported dizziness   Horizontal Canal Right Symptoms Other (comment)  unable to visualize nystagmus due to short duration   Horizontal Canal Left   Horizontal Canal Left Duration 15 seconds   Horizontal Canal Left Symptoms Geotrophic   Horizontal Canal Right Intensity   Horizontal Canal Right  Intensity Mild   Horizontal Canal Left Intensity   Horizontal Canal Left Intensity Severe  required prolonged rest break after testing due to nausea                 [facility] Adult PT Treatment/Exercise - 07/05/15 0001    Ambulation/Gait   Ambulation/Gait Yes   Ambulation/Gait Assistance 5: Supervision   Ambulation/Gait Assistance Details Noted improved gait stability as compared with PT evaluation. Pt did not require hands-on assist; however, did not mild LOB with effective self-recovery during 90-degree turn.    Ambulation Distance (Feet) 100 Feet  x2   Assistive device None   Ambulation Surface Level;Indoor   Self-Care   Self-Care Other Self-Care Comments   Other Self-Care Comments  PT demonstrated technique for horizontal rolling for habituation and provided paper handout to promote effective carryover. Insturcted pt to begin exercise tomorrow. Pt verbalized understanding.         Vestibular Treatment/Exercise - 07/05/15 0001    Vestibular Treatment/Exercise   Vestibular Treatment Provided Canalith Repositioning   Canalith Repositioning Epley Manuever Left;Canal Roll Left    EPLEY MANUEVER LEFT   Number of Reps  1   Overall Response  Improved Symptoms    RESPONSE DETAILS LEFT Performed prior to L [employer].   Canal Roll Left   Number of Reps  1   Overall Response  Improved Symptoms   Response Details  During third position (R sidelying with R cervical rotation), noted horizontal geotrophic nystagmus for approx. 50 seconds prior to resolving. Following treatment, pt reports feeling "better than when I walked in here".               PT Education - 07/05/15 [phone]    Education provided Yes   Education Details Horizontal rolling for habituation.   Person(s) Educated Patient   Methods Explanation;Demonstration;Handout   Comprehension Verbalized understanding          PT Short Term Goals - 07/02/15 1208    PT SHORT TERM GOAL #1   Title STG's = LTG's           PT Long Term Goals - 07/02/15 1222    PT LONG TERM GOAL #1   Title Pt will independently perform HEP to maximize functional gains made in PT.   (Target date: 07/30/15)   PT LONG TERM GOAL #2   Title Positional vertigo testing will be negative to indicate resolved BPPV.    (Target date: 07/30/15)   PT LONG TERM GOAL #3   Title Pt will decrease DHI score from 68 to < / = 56 to indicate significant decrease in pt-perceived disability due to dizziness.   (Target date: 07/30/15)   PT LONG TERM GOAL #4   Title Assess  strength and balance, if indicated, after vertigo clears.    (Target date: 07/30/15)               Plan - 07/05/15 0946    Clinical Impression Statement Session focused on reassessing for positional vertigo. Noted geotrophic nystagmus approx. 15 seconds with accompanying vertigo, nausea with L Supine Roll Test. Due to nystagmus and symptom intensity/duration on L > R Roll Test, unable to rule out L horizontal canalithiasis. Treated with  L Canal Roll maneuver x1 with pt-reported improvement in symptoms. Educated on horizontal rolling for habituation starting tomorrow (due to significant nausea during this session).    Pt will benefit from skilled therapeutic intervention in order to improve on the following deficits Abnormal gait;Decreased knowledge  of use of DME;Dizziness;Pain;Decreased balance   Rehab Potential Good   Clinical Impairments Affecting Rehab Potential presentation on evaluation (unable to rule out multi-canal BPPV)   PT Frequency 2x / week   PT Duration 4 weeks   PT Treatment/Interventions ADLs/Self Care Home Management;Gait training;Stair training;Functional mobility training;Therapeutic activities;Therapeutic exercise;Balance training;Neuromuscular re-education;Manual techniques;Vestibular;Canalith Repostioning;DME Instruction   PT Next Visit Plan Reassess for BPPV and treat accordingly. Ask about HEP (horizontal rolling). Reassess VOR and add gaze to HEP prn.   Consulted and Agree with Plan of Care Patient        Problem List Patient Active Problem List   Diagnosis Date Noted  . Hyperparathyroidism ([place]) 08/24/2014  . Slow transit constipation 01/13/2014  . IBS (irritable bowel syndrome), GERD, hx colon polyps, chronic abd pain - followed by Dr. Sharlett Iles in GI 06/11/2012  . Osteoporosis 08/29/2011  . Vitamin D deficiency 08/29/2011  . GERD (gastroesophageal reflux disease) 11/23/2010  . HYPERCHOLESTEROLEMIA 09/09/2007  . Essential hypertension 09/09/2007    Billie Ruddy, PT, DPT [facility] [address] [place], [place], [address] Phone: [phone]   Fax:  [phone] 07/05/2015, 9:53 AM   Name: Lenaya Whitefield MRN: [id number] Date of Birth: [birth year]

## 2015-07-05 NOTE — Patient Instructions (Signed)
Tip Card 1.The goal of habituation training is to assist in decreasing symptoms of vertigo, dizziness, or nausea provoked by specific head and body motions. 2.These exercises may initially increase symptoms; however, be persistent and work through symptoms. With repetition and time, the exercises will assist in reducing or eliminating symptoms. 3.Exercises should be stopped and discussed with the therapist if you experience any of the following: - Sudden change or fluctuation in hearing - New onset of ringing in the ears, or increase in current intensity - Any fluid discharge from the ear - Severe pain in neck or back - Extreme nausea  Copyright  VHI. All rights reserved.  Rolling   With pillow under head, start on back. Roll to your right side.  Hold until dizziness stops, plus 20 seconds and then roll to the left side.  Hold until dizziness stops, plus 20 seconds.  Repeat sequence 5 times per session. Try to end up on your right side. Do 2 sessions per day.

## 2015-07-08 ENCOUNTER — Ambulatory Visit: Payer: Medicare Other | Admitting: Physical Therapy

## 2015-07-08 DIAGNOSIS — R42 Dizziness and giddiness: Secondary | ICD-10-CM | POA: Diagnosis not present

## 2015-07-08 DIAGNOSIS — R2681 Unsteadiness on feet: Secondary | ICD-10-CM | POA: Diagnosis not present

## 2015-07-08 DIAGNOSIS — R269 Unspecified abnormalities of gait and mobility: Secondary | ICD-10-CM | POA: Diagnosis not present

## 2015-07-08 DIAGNOSIS — H8110 Benign paroxysmal vertigo, unspecified ear: Secondary | ICD-10-CM | POA: Diagnosis not present

## 2015-07-08 DIAGNOSIS — M436 Torticollis: Secondary | ICD-10-CM | POA: Diagnosis not present

## 2015-07-08 NOTE — Patient Instructions (Signed)
Tip Card 1.The goal of habituation training is to assist in decreasing symptoms of vertigo, dizziness, or nausea provoked by specific head and body motions. 2.These exercises may initially increase symptoms; however, be persistent and work through symptoms. With repetition and time, the exercises will assist in reducing or eliminating symptoms. 3.Exercises should be stopped and discussed with the therapist if you experience any of the following: - Sudden change or fluctuation in hearing - New onset of ringing in the ears, or increase in current intensity - Any fluid discharge from the ear - Severe pain in neck or back - Extreme nausea  Copyright  VHI. All rights reserved.  Rolling   With pillow under head, start on back. Roll to your right side. Hold until dizziness stops, plus 20 seconds and then roll to the left side. Hold until dizziness stops, plus 20 seconds. Repeat sequence 5 times per session. Do 2 sessions per day.   Sit to Side-Lying   Sit on edge of bed. Lie down onto the right side and hold until dizziness stops, plus 20 seconds.  Return to sitting and wait until dizziness stops, plus 20 seconds.  Repeat to the left side. Repeat sequence 5 times per session. Do 2 sessions per day.   Gaze Stabilization: Tip Card 1.Target must remain in focus, not blurry, and appear stationary while head is in motion. 2.Perform exercises with small head movements (45 to either side of midline). 3. Increase speed of head motion so long as target ("A) is in focus. 4. Inez Catalina - don't wear your reading glasses for this exercise. 5.These exercises may provoke dizziness or nausea. Work through these symptoms. If too dizzy, slow head movement slightly. Rest between each exercise. 6.Exercises demand concentration; avoid distractions. 7.For safety, perform standing exercises close to a counter, wall, corner, or next to someone.  Gaze Stabilization: Sitting    Keeping eyes on target on wall 5-6  feet away, tilt head down 15-30 and move head side to side for __30__ seconds. Repeat while moving head up and down for __30__ seconds. Do __2__ sessions per day. Repeat using target on pattern background.  Copyright  VHI. All rights reserved.

## 2015-07-08 NOTE — Therapy (Signed)
[facility] [address] [place], [place], [address] Phone: [phone]   Fax:  [phone]  Physical Therapy Treatment  Patient Details  Name: Robin [name] MRN: [id number] Date of Birth: 04-05-1934 Referring Provider: Colin Benton  Encounter Date: 07/08/2015      PT End of Session - 07/08/15 1938    Visit Number 3   Number of Visits 9   Date for PT Re-Evaluation 08/01/15   Authorization Type Medicare primary; BCBS secondary.  G Codes required   PT Start Time 0802   PT Stop Time 0845   PT Time Calculation (min) 43 min   Activity Tolerance Patient tolerated treatment well   Behavior During Therapy [facility] for tasks assessed/performed      Past Medical History  Diagnosis Date  . Hypertension   . Hyperlipemia   . Hyperparathyroidism [facility])     seeing Dr. Dwyane Dee in endocrinology  . External hemorrhoids without mention of complication   . GERD (gastroesophageal reflux disease)     hx PUD, Hiatal hernia  . Colon polyps     tubulovillous adenoma, 2010  . IBS (irritable bowel syndrome), GERD, hx colon polyps, chronic abd pain - followed by Dr. Sharlett Iles in GI 06/11/2012  . Osteoporosis 08/29/2011  . Vertigo     eval with neuro in 2011, brief recurrence 2016  . Headache     eval with neuro in 2011    Past Surgical History  Procedure Laterality Date  . Back surgery  1974  . Carpal tunnel release  2/10  . Tonsillectomy      There were no vitals filed for this visit.  Visit Diagnosis:  Dizziness and giddiness  Unsteadiness  Stiffness of neck      Subjective Assessment - 07/08/15 0803    Subjective Pt performed HEP Tuesday and Wednesday and states, "I just felt a crazy feeling in my head when I did them."   Pertinent History PMH: HTN, HLD, GERD, hyperparathyroidism   Patient Stated Goals "I want to get rid of the dizziness."   Currently in Pain? Yes   Pain Score 5    Pain Location Head   Pain Orientation  Other (Comment)  top/center of head   Pain Descriptors / Indicators Headache   Pain Type Acute pain   Pain Onset More than a month ago   Pain Frequency Intermittent   Aggravating Factors  Pt unsure   Pain Relieving Factors Pt unsure   Multiple Pain Sites No                Vestibular Assessment - 07/08/15 0001    Positional Testing   Dix-Hallpike Dix-Hallpike Right;Dix-Hallpike Left   Horizontal Canal Testing Horizontal Canal Right;Horizontal Canal Left   Dix-Hallpike Right   Dix-Hallpike Right Duration NA   Dix-Hallpike Right Symptoms No nystagmus   Dix-Hallpike Left   Dix-Hallpike Left Duration NA  "A little woozy" for < 5 sec; no nystagmus noted   Dix-Hallpike Left Symptoms No nystagmus   Horizontal Canal Right   Horizontal Canal Right Duration NA   Horizontal Canal Right Symptoms Normal   Horizontal Canal Left   Horizontal Canal Left Duration NA   Horizontal Canal Left Symptoms Normal                 [facility] Adult PT Treatment/Exercise - 07/08/15 0001    Exercises   Exercises --   Manual Therapy   Manual Therapy Myofascial release;Passive ROM   Manual therapy comments Supine:  suboccipital release x5 minutes to address cervical spine posture, increase cervical mobility to enable pt to participate in gaze stabilization exercises. Performed contract-relax PNF alternating with prolonged passive stretch (3 x30-sec holds each) of B lateral flexion and B rotation of cervical spine.         Vestibular Treatment/Exercise - 07/08/15 0001    Vestibular Treatment/Exercise   Vestibular Treatment Provided Gaze   Habituation Exercises Nestor Lewandowsky   Gaze Exercises X1 Viewing Horizontal;X1 Viewing Vertical   Nestor Lewandowsky   Number of Reps  2   Symptom Description  "wooziness" for < 5 seconds   X1 Viewing Horizontal   Foot Position seated   Comments 30 sec  increased headache   X1 Viewing Vertical   Foot Position seated   Comments 30 sec                  PT Short Term Goals - 07/02/15 1208    PT SHORT TERM GOAL #1   Title STG's = LTG's           PT Long Term Goals - 07/08/15 1923    PT LONG TERM GOAL #1   Title Pt will independently perform HEP to maximize functional gains made in PT.   (Target date: 07/30/15)   PT LONG TERM GOAL #2   Title Positional vertigo testing will be negative to indicate resolved BPPV.    (Target date: 07/30/15)   Status Achieved   PT LONG TERM GOAL #3   Title Pt will decrease DHI score from 68 to < / = 56 to indicate significant decrease in pt-perceived disability due to dizziness.   (Target date: 07/30/15)   PT LONG TERM GOAL #4   Title Assess strength and balance, if indicated, after vertigo clears.    (Target date: 07/30/15)               Plan - 07/08/15 1938    Clinical Impression Statement Positional testing suggests BPPV resolved. Pt continues to note short duration (< 5 seconds), mild symptoms of motion sensitivity with L Dix-Hallpike and supine <> sit. Addressed soft tissue restrictions in cervical spine to enable pt to participate in gaze stabilization exercises. HEP updated with emphasis on habituation and seated gaze.   Pt will benefit from skilled therapeutic intervention in order to improve on the following deficits Abnormal gait;Decreased knowledge of use of DME;Dizziness;Pain;Decreased balance   Rehab Potential Good   Clinical Impairments Affecting Rehab Potential presentation on evaluation (unable to rule out multi-canal BPPV)   PT Frequency 2x / week   PT Duration 4 weeks   PT Treatment/Interventions ADLs/Self Care Home Management;Gait training;Stair training;Functional mobility training;Therapeutic activities;Therapeutic exercise;Balance training;Neuromuscular re-education;Manual techniques;Vestibular;Canalith Repostioning;DME Instruction   PT Next Visit Plan Assess current HEP and progress prn. Consider adding EC on pillow(s) with/without head turns.   Consulted  and Agree with Plan of Care Patient        Problem List Patient Active Problem List   Diagnosis Date Noted  . Hyperparathyroidism ([place]) 08/24/2014  . Slow transit constipation 01/13/2014  . IBS (irritable bowel syndrome), GERD, hx colon polyps, chronic abd pain - followed by Dr. Sharlett Iles in GI 06/11/2012  . Osteoporosis 08/29/2011  . Vitamin D deficiency 08/29/2011  . GERD (gastroesophageal reflux disease) 11/23/2010  . HYPERCHOLESTEROLEMIA 09/09/2007  . Essential hypertension 09/09/2007   Billie Ruddy, PT, DPT [facility] [address] [place], [place], [address] Phone: [phone]   Fax:  [phone] 07/08/2015, 7:42 PM  Name: Robin [name] MRN: [id number] Date of Birth: 14-Apr-1934

## 2015-07-12 ENCOUNTER — Ambulatory Visit: Payer: Medicare Other | Admitting: Physical Therapy

## 2015-07-12 DIAGNOSIS — R269 Unspecified abnormalities of gait and mobility: Secondary | ICD-10-CM

## 2015-07-12 DIAGNOSIS — R2681 Unsteadiness on feet: Secondary | ICD-10-CM

## 2015-07-12 DIAGNOSIS — R42 Dizziness and giddiness: Secondary | ICD-10-CM | POA: Diagnosis not present

## 2015-07-12 DIAGNOSIS — H8110 Benign paroxysmal vertigo, unspecified ear: Secondary | ICD-10-CM | POA: Diagnosis not present

## 2015-07-12 DIAGNOSIS — M436 Torticollis: Secondary | ICD-10-CM

## 2015-07-12 NOTE — Therapy (Signed)
[facility] [address] [place], [place], [address] Phone: [phone]   Fax:  [phone]  Physical Therapy Treatment  Patient Details  Name: Robin [name] MRN: [id number] Date of Birth: 12/19/1933 Referring Provider: Hannah Kim, DO  Encounter Date: 07/12/2015      PT End of Session - 07/12/15 1221    Visit Number 4   Number of Visits 13  requesting up to 8 additional sessions, as needed   Date for PT Re-Evaluation 08/11/15   Authorization Type Medicare primary; BCBS secondary.  G Codes required   PT Start Time 0850   PT Stop Time 0930   PT Time Calculation (min) 40 min   Equipment Utilized During Treatment Gait belt   Activity Tolerance Patient tolerated treatment well   Behavior During Therapy WFL for tasks assessed/performed      Past Medical History  Diagnosis Date  . Hypertension   . Hyperlipemia   . Hyperparathyroidism ([facility])     seeing Dr. Kumar in endocrinology  . External hemorrhoids without mention of complication   . GERD (gastroesophageal reflux disease)     hx PUD, Hiatal hernia  . Colon polyps     tubulovillous adenoma, 2010  . IBS (irritable bowel syndrome), GERD, hx colon polyps, chronic abd pain - followed by Dr. Patterson in GI 06/11/2012  . Osteoporosis 08/29/2011  . Vertigo     eval with neuro in 2011, brief recurrence 2016  . Headache     eval with neuro in 2011    Past Surgical History  Procedure Laterality Date  . Back surgery  1974  . Carpal tunnel release  2/10  . Tonsillectomy      There were no vitals filed for this visit.  Visit Diagnosis:  Abnormality of gait - Plan: PT plan of care cert/re-cert  Dizziness and giddiness - Plan: PT plan of care cert/re-cert  Unsteadiness - Plan: PT plan of care cert/re-cert  Stiffness of neck - Plan: PT plan of care cert/re-cert      Subjective Assessment - 07/12/15 0851    Subjective Pt reports dizziness and headache are  better. Pt states, "My head still feels top heavy and I feel off-balance."   Pertinent History PMH: HTN, HLD, GERD, hyperparathyroidism   Patient Stated Goals "I want to get rid of the dizziness."   Currently in Pain? Yes   Pain Score 3    Pain Location Head   Pain Orientation Other (Comment)  top/center of head   Pain Descriptors / Indicators Headache   Pain Type Chronic pain   Pain Onset More than a month ago   Pain Frequency Intermittent   Aggravating Factors  "If I don't have a headache, I do after the (vestibular) exercises."   Pain Relieving Factors Pt unsure   Multiple Pain Sites No            [facility] PT Assessment - 07/12/15 0001    Assessment   Medical Diagnosis vertigo   Referring Provider Hannah Kim, DO   Onset Date/Surgical Date 04/01/15   Prior Function   Level of Independence Independent                     [facility] Adult PT Treatment/Exercise - 07/12/15 0001    Ambulation/Gait   Ambulation/Gait Yes   Ambulation/Gait Assistance 6: Modified independent (Device/Increase time);5: Supervision   Ambulation/Gait Assistance Details Mod I for linear gait, (S) for aspects of dynamic gait; see DGI for details     Ambulation Distance (Feet) 300 Feet   Assistive device None   Gait Pattern Step-through pattern;Decreased stride length;Decreased trunk rotation  turns en bloc   Ambulation Surface Level;Indoor   Gait velocity 2.87 ft/sec   Standardized Balance Assessment   Standardized Balance Assessment Dynamic Gait Index   Dynamic Gait Index   Level Surface Mild Impairment   Change in Gait Speed Moderate Impairment  minimal change in gait speed   Gait with Horizontal Head Turns Mild Impairment   Gait with Vertical Head Turns Moderate Impairment  increased dizziness with transition from up > down   Gait and Pivot Turn Moderate Impairment  LOB posteriorly and to R   Step Over Obstacle Moderate Impairment   Step Around Obstacles Mild Impairment   Steps Mild  Impairment   Total Score 12         Vestibular Treatment/Exercise - 07/12/15 0001    Vestibular Treatment/Exercise   Vestibular Treatment Provided Habituation;Gaze   Habituation Exercises Brandt Daroff;Horizontal Roll   Gaze Exercises X1 Viewing Horizontal;X1 Viewing Vertical   Brandt Daroff   Number of Reps  3   Symptom Description  1/5 symptoms with initial rep of R sidelying > sit; 0/5 symptoms for remaining reps.   Horizontal Roll   Number of Reps  2   Symptom Description  no symptoms; removed from HEP   X1 Viewing Horizontal   Foot Position standing; BUE support   Comments 20 seconds (to pt tolerance)   X1 Viewing Vertical   Foot Position standing; BUE support   Comments 20 seconds (to pt tolerance)            Balance Exercises - 07/12/15 0923    Balance Exercises: Standing   Standing Eyes Closed Wide (BOA);Solid surface           PT Education - 07/12/15 1217    Education provided Yes   Education Details Modified HEP to remove horizontal rolling, progress gaze stabilization to standing, and to add corner balance exercise.   Person(s) Educated Patient   Methods Explanation;Demonstration;Verbal cues;Handout   Comprehension Verbalized understanding;Returned demonstration          PT Short Term Goals - 07/02/15 1208    PT SHORT TERM GOAL #1   Title STG's = LTG's           PT Long Term Goals - 07/12/15 0924    PT LONG TERM GOAL #1   Title Pt will independently perform HEP to maximize functional gains made in PT.   (Target date: 07/30/15)   Baseline Met 2/27.   Status Achieved   PT LONG TERM GOAL #2   Title Positional vertigo testing will be negative to indicate resolved BPPV.    (Target date: 07/30/15)   Baseline Met 2/27.   Status Achieved   PT LONG TERM GOAL #3   Title Pt will decrease DHI score from 68 to < / = 56 to indicate significant decrease in pt-perceived disability due to dizziness.   (Modified target date: 08/09/15)   Baseline Continue  goal through renewed POC.   Status On-going   PT LONG TERM GOAL #4   Title Assess strength and balance, if indicated, after vertigo clears.    (Target date: 07/30/15)   Baseline Met 2/27.   Status Achieved   PT LONG TERM GOAL #5   Title Pt will improve DGI score from 12/24 to > 19/24 to indicate decreased risk of falling.   (Target date: 08/09/15)   Status New     Additional Long Term Goals   Additional Long Term Goals Yes   PT LONG TERM GOAL #6   Title Pt will demonstrate 65 degrees of bilateral cervical spine rotation AROM to indicate progress toward normalized neck ROM.   (Target date: 08/09/15)   Status New               Plan - 07/12/15 1228    Clinical Impression Statement Since beginning this episode of vestibular PT, multi-canal BPPV has resolved and pt no longer reports vertiginous symptoms. Pt does, however, note increased imbalance with dynamic gait activities (head turns, stepping over objects, acceleration/deceleration); pt reports motion sensitivity, disequilibrium, and headache with activities requiring increased reliance on vestibular input; and pt demonstrates increased stiffness of neck due to avoiding head movement/dizziness since onset of BPPV in 03/2015. DGI score of 12/24 suggests increased fall risk. Pt will continue to benefit from skilled outpatient PT  2x/week for up to 4 additional weeks, as needed, to address said impairments and decrease fall risk.   Pt will benefit from skilled therapeutic intervention in order to improve on the following deficits Abnormal gait;Decreased knowledge of use of DME;Dizziness;Pain;Decreased balance;Decreased range of motion;Postural dysfunction   Rehab Potential Good   Clinical Impairments Affecting Rehab Potential multi-canal BPPV (recently resolved)   PT Frequency 2x / week   PT Duration 4 weeks   PT Treatment/Interventions ADLs/Self Care Home Management;Gait training;Stair training;Functional mobility training;Therapeutic  activities;Therapeutic exercise;Balance training;Neuromuscular re-education;Manual techniques;Vestibular;Canalith Repostioning;DME Instruction;Patient/family education   PT Next Visit Plan Assess cervical spine rotation AROM and address soft tissue restrictions. Assess current HEP and progress prn.   Consulted and Agree with Plan of Care Patient        Problem List Patient Active Problem List   Diagnosis Date Noted  . Hyperparathyroidism ([place]) 08/24/2014  . Slow transit constipation 01/13/2014  . IBS (irritable bowel syndrome), GERD, hx colon polyps, chronic abd pain - followed by Dr. Sharlett Iles in GI 06/11/2012  . Osteoporosis 08/29/2011  . Vitamin D deficiency 08/29/2011  . GERD (gastroesophageal reflux disease) 11/23/2010  . HYPERCHOLESTEROLEMIA 09/09/2007  . Essential hypertension 09/09/2007   Billie Ruddy, PT, DPT [facility] [address] [place], [place], [address] Phone: [phone]   Fax:  [phone] 07/12/2015, 1:09 PM   Name: Robin [name] MRN: [id number] Date of Birth: October 22, 1933

## 2015-07-12 NOTE — Patient Instructions (Addendum)
Tip Card 1.The goal of habituation training is to assist in decreasing symptoms of vertigo, dizziness, or nausea provoked by specific head and body motions. 2.These exercises may initially increase symptoms; however, be persistent and work through symptoms. With repetition and time, the exercises will assist in reducing or eliminating symptoms. 3.Exercises should be stopped and discussed with the therapist if you experience any of the following: - Sudden change or fluctuation in hearing - New onset of ringing in the ears, or increase in current intensity - Any fluid discharge from the ear - Severe pain in neck or back - Extreme nausea  Sit to Side-Lying   Sit on edge of bed. Lie down onto the right side and hold until dizziness stops, plus 20 seconds. Return to sitting and wait until dizziness stops, plus 20 seconds. Repeat to the left side. Repeat sequence 5 times per session. Do 2 sessions per day. Zanna, continue this exercise until you have no symptoms for 2 consecutive days.   Gaze Stabilization: Tip Card 1.Target must remain in focus, not blurry, and appear stationary while head is in motion. 2.Perform exercises with small head movements (45 to either side of midline). 3. Increase speed of head motion so long as target ("A) is in focus. 4. Inez Catalina - don't wear your reading glasses for this exercise. 5.These exercises may provoke dizziness or nausea. Work through these symptoms. If too dizzy, slow head movement slightly. Rest between each exercise. 6.Exercises demand concentration; avoid distractions. 7.For safety, perform standing exercises close to a counter, wall, corner, or next to someone.  Gaze Stabilization: Standing Feet Apart    Stand with feet shoulder width apart. Hold onto a stable surface. Keeping eyes on target on wall 5-6 feet away, tilt head down 15-30 and move head side to side for __20__ seconds. Repeat while moving head up and down for __20__ seconds. Do __2__  sessions per day.  Balance: Eyes Closed - Bilateral (Varied Surfaces)    Stand with back to corner with a stable chair in front of you. Don't stand on a pillow yet.  Feet shoulder width, close eyes. Maintain balance _20___ seconds. Repeat _3_  times per set. Do this exercise __2__ times per day.  * Remember to stop and rest if symptoms (dizziness, headache) go above a 4/10 during these exercise.

## 2015-07-15 ENCOUNTER — Ambulatory Visit: Payer: Medicare Other | Attending: Family Medicine | Admitting: Physical Therapy

## 2015-07-15 DIAGNOSIS — R2681 Unsteadiness on feet: Secondary | ICD-10-CM | POA: Insufficient documentation

## 2015-07-15 DIAGNOSIS — R42 Dizziness and giddiness: Secondary | ICD-10-CM | POA: Diagnosis not present

## 2015-07-15 DIAGNOSIS — H8110 Benign paroxysmal vertigo, unspecified ear: Secondary | ICD-10-CM | POA: Diagnosis not present

## 2015-07-15 DIAGNOSIS — M436 Torticollis: Secondary | ICD-10-CM | POA: Insufficient documentation

## 2015-07-15 DIAGNOSIS — R269 Unspecified abnormalities of gait and mobility: Secondary | ICD-10-CM | POA: Diagnosis not present

## 2015-07-15 NOTE — Therapy (Signed)
[facility] [address] [place], [place], [address] Phone: [phone]   Fax:  [phone]  Physical Therapy Treatment  Patient Details  Name: Robin [name] MRN: [id number] Date of Birth: Apr 12, 1934 Referring Provider: Colin Benton, DO  Encounter Date: 07/15/2015      PT End of Session - 07/15/15 1008    Visit Number 5   Number of Visits 13   Date for PT Re-Evaluation 08/11/15   Authorization Type Medicare primary; BCBS secondary.  G Codes required   PT Start Time [id number]   PT Stop Time 0945   PT Time Calculation (min) 58 min   Activity Tolerance Patient tolerated treatment well;No increased pain   Behavior During Therapy [facility] for tasks assessed/performed      Past Medical History  Diagnosis Date  . Hypertension   . Hyperlipemia   . Hyperparathyroidism [facility])     seeing Dr. Dwyane Dee in endocrinology  . External hemorrhoids without mention of complication   . GERD (gastroesophageal reflux disease)     hx PUD, Hiatal hernia  . Colon polyps     tubulovillous adenoma, 2010  . IBS (irritable bowel syndrome), GERD, hx colon polyps, chronic abd pain - followed by Dr. Sharlett Iles in GI 06/11/2012  . Osteoporosis 08/29/2011  . Vertigo     eval with neuro in 2011, brief recurrence 2016  . Headache     eval with neuro in 2011    Past Surgical History  Procedure Laterality Date  . Back surgery  1974  . Carpal tunnel release  2/10  . Tonsillectomy      There were no vitals filed for this visit.  Visit Diagnosis:  Stiffness of neck  Dizziness and giddiness  Unsteadiness      Subjective Assessment - 07/15/15 0848    Subjective Pt continues to report perception of feeling "top heavy and off-balance." Also reports ongoing stiffness of neck and headache. Has not been experiencing symptoms with sidelying <> sit exercise since last session.   Pertinent History PMH: HTN, HLD, GERD, hyperparathyroidism   Patient Stated Goals  "I want to get rid of the dizziness."   Currently in Pain? Yes   Pain Score 5    Pain Location Head   Pain Orientation Other (Comment)  top/center of head   Pain Descriptors / Indicators Headache   Pain Type Chronic pain   Pain Onset More than a month ago   Pain Frequency Intermittent   Aggravating Factors  Pt unsure, but vestibular exercises do seem to make headache worse at times   Pain Relieving Factors Pt unsure   Multiple Pain Sites No            [facility] PT Assessment - 07/15/15 0001    ROM / Strength   AROM / PROM / Strength (p) AROM   AROM   Overall AROM  (p) Deficits   AROM Assessment Site (p) Cervical   Cervical - Right Rotation (p) 40  painful   Cervical - Left Rotation (p) 38  painful                     [facility] Adult PT Treatment/Exercise - 07/15/15 0001    Exercises   Exercises Other Exercises   Other Exercises  With verbal/demo cueing from PT, pt effectively performed middle scalene stretch 2 x30-sec holds per side.   Manual Therapy   Manual Therapy Soft tissue mobilization;Myofascial release   Manual therapy comments Supine: suboccipital release x4  minutes to increase extensibility of suboccipital musculature, improve postural alignment. Performed contract-relax PNF (4 x3-sec holds each) alternating with prolonged passive stretch (4 x30-sec holds each) of the following muscle groups, bilaterally: upper trapezius (soft tissue restriction on L > R), levator scapulae, anterior/middle scalenes (ST restriction on R > L), SCM. STM/MFR at B anterior/middle scalenes x5 minutes per side. Performed active assisted cervical rotation  x5 reps per direction with manual facilitation of cervical retraction at end range rotation (due to increasingly more prominent upper cervical extension at end range).          Vestibular Treatment/Exercise - 07/15/15 0001    Vestibular Treatment/Exercise   Vestibular Treatment Provided Habituation;Gaze   Nestor Lewandowsky   Number  of Reps  2   Symptom Description  No symptoms  removed from HEP   X1 Viewing Horizontal   Foot Position standing without UE support   Comments 30 seconds with no increase in dizziness, symptoms. Limited by cervical spine muscular endurance   X1 Viewing Vertical   Foot Position standing without UE support   Comments 30 seconds with no increase in dizziness, symptoms. Limited by cervical spine muscular endurance            Balance Exercises - 07/15/15 1008    Balance Exercises: Standing   Standing Eyes Closed Wide (BOA);Foam/compliant surface;20 secs;2 reps  1 pillow           PT Education - 07/15/15 0952    Education provided Yes   Education Details HEP: removed supine <> sit for habituation, progressed corner balance and gaze stabilization; added scalene stretch.   Person(s) Educated Patient   Methods Explanation;Demonstration;Verbal cues;Handout   Comprehension Verbalized understanding;Returned demonstration          PT Short Term Goals - 07/02/15 1208    PT SHORT TERM GOAL #1   Title STG's = LTG's           PT Long Term Goals - 07/15/15 5329    PT LONG TERM GOAL #1   Title Pt will independently perform HEP to maximize functional gains made in PT.   (Target date: 07/30/15)   Baseline Met 2/27.   Status Achieved   PT LONG TERM GOAL #2   Title Positional vertigo testing will be negative to indicate resolved BPPV.    (Target date: 07/30/15)   Baseline Met 2/27.   Status Achieved   PT LONG TERM GOAL #3   Title Pt will decrease DHI score from 68 to < / = 56 to indicate significant decrease in pt-perceived disability due to dizziness.   (Modified target date: 08/09/15)   Baseline Continue goal through renewed POC.   Status On-going   PT LONG TERM GOAL #4   Title Assess strength and balance, if indicated, after vertigo clears.    (Target date: 07/30/15)   Baseline Met 2/27.   Status Achieved   PT LONG TERM GOAL #5   Title Pt will improve DGI score from 12/24 to >  19/24 to indicate decreased risk of falling.   (Target date: 08/09/15)   Status On-going   PT LONG TERM GOAL #6   Title Pt will demonstrate 65 degrees of bilateral cervical spine rotation AROM to indicate progress toward normalized neck ROM.   (Target date: 08/09/15)   Baseline Baseline: 38* to L, 40" to R.   Status On-going               Plan - 07/15/15 1011    Clinical  Impression Statement Current session primarily focused on addressing soft tissue restrictions in cervical spine to normalize neck ROM and increase pt tolerance to head movement. Suspect limited cervical spine motion, headache may be attributable to BPPV. Pt tolerated interventions well, reporting decreased neck pain, stiffness and less headache pain (pt did not rate) within this session. Progressed vestibular HE due to pt progress.   Pt will benefit from skilled therapeutic intervention in order to improve on the following deficits Abnormal gait;Decreased knowledge of use of DME;Dizziness;Pain;Decreased balance;Decreased range of motion;Postural dysfunction   Rehab Potential Good   Clinical Impairments Affecting Rehab Potential multi-canal BPPV (recently resolved)   PT Frequency 2x / week   PT Duration 4 weeks   PT Treatment/Interventions ADLs/Self Care Home Management;Gait training;Stair training;Functional mobility training;Therapeutic activities;Therapeutic exercise;Balance training;Neuromuscular re-education;Manual techniques;Vestibular;Canalith Repostioning;DME Instruction;Patient/family education   PT Next Visit Plan Assess current HEP and progress prn.  Consider SOT to direct treatment.   Consulted and Agree with Plan of Care Patient        Problem List Patient Active Problem List   Diagnosis Date Noted  . Hyperparathyroidism ([place]) 08/24/2014  . Slow transit constipation 01/13/2014  . IBS (irritable bowel syndrome), GERD, hx colon polyps, chronic abd pain - followed by Dr. Sharlett Iles in GI 06/11/2012  .  Osteoporosis 08/29/2011  . Vitamin D deficiency 08/29/2011  . GERD (gastroesophageal reflux disease) 11/23/2010  . HYPERCHOLESTEROLEMIA 09/09/2007  . Essential hypertension 09/09/2007    Billie Ruddy, PT, DPT [facility] [address] [place], [place], [address] Phone: [phone]   Fax:  [phone] 07/15/2015, 10:15 AM   Name: Robin [name] MRN: [id number] Date of Birth: 1933/11/08

## 2015-07-15 NOTE — Patient Instructions (Addendum)
Gaze Stabilization: Tip Card 1.Target must remain in focus, not blurry, and appear stationary while head is in motion. 2.Perform exercises with small head movements (45 to either side of midline). 3. Increase speed of head motion so long as target ("A) is in focus. 4. Robin [name] - don't wear your reading glasses for this exercise. 5.These exercises may provoke dizziness or nausea. Work through these symptoms. If too dizzy, slow head movement slightly. Rest between each exercise. 6.Exercises demand concentration; avoid distractions. 7.For safety, perform standing exercises close to a counter, wall, corner, or next to someone.  Gaze Stabilization: Standing Feet Apart    Stand with feet shoulder width apart. Keeping eyes on target on wall 5-6 feet away, tilt head down 15-30 and move head side to side for __30__ seconds. Repeat while moving head up and down for __30__ seconds. Do __2__ sessions per day.  Balance: Eyes Closed - Bilateral (Varied Surfaces)    Stand on a pillowwith back to corner with a stable chair in front of you. Feet shoulder width, close eyes. Maintain balance _20___ seconds. Repeat _3_ times per set. Do this exercise __2__ times per day.  * Remember to stop and rest if symptoms (dizziness, headache) go above a 4/10 during these exercise.     Scalene Stretch  Bend head to the side (ear to shoulder) then tuck your chin.  Hold for 30 seconds, 3-4 times per day on each side.

## 2015-07-19 ENCOUNTER — Encounter: Payer: Medicare Other | Admitting: Physical Therapy

## 2015-07-19 ENCOUNTER — Ambulatory Visit: Payer: Medicare Other | Admitting: Physical Therapy

## 2015-07-19 DIAGNOSIS — H8110 Benign paroxysmal vertigo, unspecified ear: Secondary | ICD-10-CM

## 2015-07-19 DIAGNOSIS — R2681 Unsteadiness on feet: Secondary | ICD-10-CM | POA: Diagnosis not present

## 2015-07-19 DIAGNOSIS — R269 Unspecified abnormalities of gait and mobility: Secondary | ICD-10-CM

## 2015-07-19 DIAGNOSIS — M436 Torticollis: Secondary | ICD-10-CM

## 2015-07-19 DIAGNOSIS — R42 Dizziness and giddiness: Secondary | ICD-10-CM | POA: Diagnosis not present

## 2015-07-19 NOTE — Therapy (Signed)
[facility] [address] [place], [place], [address] Phone: [phone]   Fax:  [phone]  Physical Therapy Treatment  Patient Details  Name: Robin [name] MRN: [id number] Date of Birth: Nov 25, 1933 Referring Provider: Colin Benton, DO  Encounter Date: 07/19/2015      PT End of Session - 07/19/15 1215    Visit Number 6   Number of Visits 13   Date for PT Re-Evaluation 08/11/15   PT Start Time [id number]   PT Stop Time 1058   PT Time Calculation (min) 43 min   Equipment Utilized During Treatment Gait belt   Activity Tolerance Patient tolerated treatment well;No increased pain   Behavior During Therapy [facility] for tasks assessed/performed      Past Medical History  Diagnosis Date  . Hypertension   . Hyperlipemia   . Hyperparathyroidism [facility])     seeing Dr. Dwyane Dee in endocrinology  . External hemorrhoids without mention of complication   . GERD (gastroesophageal reflux disease)     hx PUD, Hiatal hernia  . Colon polyps     tubulovillous adenoma, 2010  . IBS (irritable bowel syndrome), GERD, hx colon polyps, chronic abd pain - followed by Dr. Sharlett Iles in GI 06/11/2012  . Osteoporosis 08/29/2011  . Vertigo     eval with neuro in 2011, brief recurrence 2016  . Headache     eval with neuro in 2011    Past Surgical History  Procedure Laterality Date  . Back surgery  1974  . Carpal tunnel release  2/10  . Tonsillectomy      There were no vitals filed for this visit.  Visit Diagnosis:  Stiffness of neck  Dizziness and giddiness  Unsteadiness  Abnormality of gait  BPPV (benign paroxysmal positional vertigo), unspecified laterality      Subjective Assessment - 07/19/15 1019    Subjective Feels a lot better.  Thinks exercises and manual really helped last time. Feels she can turn her head a little better.  Had intermittent dizziness all day Saturday, but no dizziness since then.   Patient Stated Goals "I want to  get rid of the dizziness."   Currently in Pain? No/denies                         [facility] Adult PT Treatment/Exercise - 07/19/15 1021    Exercises   Exercises Neck   Manual Therapy   Manual Therapy Soft tissue mobilization;Joint mobilization   Joint Mobilization bil lateral glides grades 2-3 C4-7   Soft tissue mobilization sub occipital release; trigger point release to cervical musculature and R upper trap   Neck Exercises: Stretches   Upper Trapezius Stretch 2 reps;30 seconds   Upper Trapezius Stretch Limitations mod cues for technique         Vestibular Treatment/Exercise - 07/19/15 0001    X1 Viewing Horizontal   Foot Position standing feet apart on level and compliant        Reviewed HEP: min cues for stretches; progressed gaze x 1 and static standng; see pt instructions Feet together on compliant surface: horizontal/vertical head turns x 10 with EO; feet apart on compliant surface with horizontal./vertical head turns x 10 and EC  Rockerboard ant/post and laterally: weight shifting and horizontal/vertical head turns with intermittent UE support  Forward taps with intermittent UE support on red balance beam alt x 10 bil.        PT Education - 07/19/15 1214  Education provided Yes   Education Details progressed balance/vestibular HEP   Person(s) Educated Patient   Methods Demonstration;Explanation;Handout   Comprehension Verbalized understanding;Returned demonstration;Need further instruction          PT Short Term Goals - 07/02/15 1208    PT SHORT TERM GOAL #1   Title STG's = LTG's           PT Long Term Goals - 07/19/15 1216    PT LONG TERM GOAL #1   Title Pt will independently perform HEP to maximize functional gains made in PT.   (Target date: 07/30/15)   Status Achieved   PT LONG TERM GOAL #2   Title Positional vertigo testing will be negative to indicate resolved BPPV.    (Target date: 07/30/15)   Status Achieved   PT LONG TERM  GOAL #3   Title Pt will decrease DHI score from 68 to < / = 56 to indicate significant decrease in pt-perceived disability due to dizziness.   (Modified target date: 08/09/15)   Status On-going   PT LONG TERM GOAL #4   Title Assess strength and balance, if indicated, after vertigo clears.    (Target date: 07/30/15)   Status Achieved   PT LONG TERM GOAL #5   Title Pt will improve DGI score from 12/24 to > 19/24 to indicate decreased risk of falling.   (Target date: 08/09/15)   Status On-going   PT LONG TERM GOAL #6   Title Pt will demonstrate 65 degrees of bilateral cervical spine rotation AROM to indicate progress toward normalized neck ROM.   (Target date: 08/09/15)   Status On-going               Plan - 07/19/15 1215    Clinical Impression Statement Pt tolerated balance HEP well and able to progress today to more challenging positions.  Pt without dizziness x 2 days and feels neck is improved which has helped symptoms.   PT Next Visit Plan Assess current HEP and progress prn.  Consider SOT to direct treatment.   Consulted and Agree with Plan of Care Patient        Problem List Patient Active Problem List   Diagnosis Date Noted  . Hyperparathyroidism ([place]) 08/24/2014  . Slow transit constipation 01/13/2014  . IBS (irritable bowel syndrome), GERD, hx colon polyps, chronic abd pain - followed by Dr. Sharlett Iles in GI 06/11/2012  . Osteoporosis 08/29/2011  . Vitamin D deficiency 08/29/2011  . GERD (gastroesophageal reflux disease) 11/23/2010  . HYPERCHOLESTEROLEMIA 09/09/2007  . Essential hypertension 09/09/2007   Laureen Abrahams, PT, DPT 07/19/2015 12:18 PM  [facility] [address] [place], [place], [address] Phone: [phone]   Fax:  [phone]  Name: Claretha Dobey MRN: [id number] Date of Birth: 11-Oct-1933

## 2015-07-19 NOTE — Patient Instructions (Signed)
Feet Together (Compliant Surface) Varied Arm Positions - Eyes Closed    Stand on compliant surface: _pillow_______ with feet together and arms at sides. Close eyes and visualize upright position. Hold__20__ seconds. Repeat __3__ times per session. Do __2-3__ sessions per day.  Copyright  VHI. All rights reserved.   Gaze Stabilization: Standing Feet Apart (Compliant Surface)    Feet apart on pillow, keeping eyes on target on wall _3-5___ feet away, tilt head down 15-30 and move head side to side for __30__ seconds. Repeat while moving head up and down for __30__ seconds. Do __2-3__ sessions per day. Copyright  VHI. All rights reserved.

## 2015-07-21 ENCOUNTER — Ambulatory Visit: Payer: Medicare Other | Admitting: Physical Therapy

## 2015-07-21 DIAGNOSIS — R2681 Unsteadiness on feet: Secondary | ICD-10-CM | POA: Diagnosis not present

## 2015-07-21 DIAGNOSIS — R42 Dizziness and giddiness: Secondary | ICD-10-CM

## 2015-07-21 DIAGNOSIS — H8110 Benign paroxysmal vertigo, unspecified ear: Secondary | ICD-10-CM | POA: Diagnosis not present

## 2015-07-21 DIAGNOSIS — M436 Torticollis: Secondary | ICD-10-CM

## 2015-07-21 DIAGNOSIS — R269 Unspecified abnormalities of gait and mobility: Secondary | ICD-10-CM | POA: Diagnosis not present

## 2015-07-21 NOTE — Therapy (Signed)
[facility] [address] [place], [place], [address] Phone: [phone]   Fax:  [phone]  Physical Therapy Treatment  Patient Details  Name: Robin [name] MRN: [id number] Date of Birth: February 05, 1934 Referring Provider: Colin Benton, DO  Encounter Date: 07/21/2015      PT End of Session - 07/21/15 2218    Visit Number 7   Number of Visits 13   Date for PT Re-Evaluation 08/11/15   Authorization Type Medicare primary; BCBS secondary.  G Codes required   PT Start Time [phone]   PT Stop Time 0934   PT Time Calculation (min) 52 min   Activity Tolerance Patient tolerated treatment well;No increased pain   Behavior During Therapy [facility] for tasks assessed/performed      Past Medical History  Diagnosis Date  . Hypertension   . Hyperlipemia   . Hyperparathyroidism [facility])     seeing Dr. Dwyane Dee in endocrinology  . External hemorrhoids without mention of complication   . GERD (gastroesophageal reflux disease)     hx PUD, Hiatal hernia  . Colon polyps     tubulovillous adenoma, 2010  . IBS (irritable bowel syndrome), GERD, hx colon polyps, chronic abd pain - followed by Dr. Sharlett Iles in GI 06/11/2012  . Osteoporosis 08/29/2011  . Vertigo     eval with neuro in 2011, brief recurrence 2016  . Headache     eval with neuro in 2011    Past Surgical History  Procedure Laterality Date  . Back surgery  1974  . Carpal tunnel release  2/10  . Tonsillectomy      There were no vitals filed for this visit.  Visit Diagnosis:  Stiffness of neck  Dizziness and giddiness  Unsteadiness      Subjective Assessment - 07/21/15 0844    Subjective Pt reports no further dizziness. Neck is "feeling better; she worked on it a little bit the other day. I do believe the neck problem had a little to do with my headache."   Pertinent History PMH: HTN, HLD, GERD, hyperparathyroidism   Patient Stated Goals "I want to get rid of the dizziness."   Currently in Pain? No/denies            [facility] PT Assessment - 07/21/15 0001    AROM   Overall AROM  Deficits   AROM Assessment Site Cervical   Cervical - Right Rotation 36  painful   Cervical - Left Rotation 44            Vestibular Assessment - 07/21/15 0001    Orthostatics   BP supine (x 5 minutes) 156/68 mmHg   HR supine (x 5 minutes) 64   BP standing (after 1 minute) 169/75 mmHg   HR standing (after 1 minute) 64   BP standing (after 3 minutes) 158/73 mmHg   HR standing (after 3 minutes) 63                 [facility] Adult PT Treatment/Exercise - 07/21/15 0001    Exercises   Exercises Other Exercises   Other Exercises  Chin tuck (seated) with use of mirror for visual feedback 5 x5-sec holds; Cervical lateral flexion with chin tuck for middle scalene stretch 2 x30-sec holds per side with mirror for visual feedback. Supine chin tuck with towel roll 5 x5-sec holds.    Manual Therapy   Manual Therapy Myofascial release;Muscle Energy Technique;Joint mobilization   Manual therapy comments Supine: suboccipital release x3 minutes   Joint  Mobilization Lateral glides grades I-III at C4-7, bilat.   Muscle Energy Technique MET for cervical spine rotation 5 x3-sec contraction at endrange cervical spine rotation alternating with  5 x10-sec holds of passve stretch at endrange bilat.         Vestibular Treatment/Exercise - 07/21/15 0001    Vestibular Treatment/Exercise   Vestibular Treatment Provided Gaze   X1 Viewing Horizontal   Foot Position standing on pillow   Reps 2   Comments x30 sec then x45 sec   X1 Viewing Vertical   Foot Position standing on pillow   Reps 2   Comments x30 sec then x45 sec            Balance Exercises - 07/21/15 2209    Balance Exercises: Standing   Standing Eyes Closed Narrow base of support (BOS);Wide (BOA);Head turns;Foam/compliant surface;Other reps (comment)  2 pillows w/ narrow BOS; 1 pillow w/ wide BOS, head turns    Rockerboard Anterior/posterior;Head turns;EO;30 seconds;Other (comment)  large board: wide BOS > tandem w/ balloon tap   Gait with Head Turns Forward;Other (comment)  2 x50' with horizontal, vertical; min guard required   Retro Gait 2 reps;Other (comment)  2 x50' with (S)   Turning Both;3 reps           PT Education - 07/21/15 2208    Education provided Yes   Education Details HEP progressed.   Person(s) Educated Patient   Methods Explanation;Demonstration;Handout   Comprehension Verbalized understanding;Returned demonstration          PT Short Term Goals - 07/02/15 1208    PT SHORT TERM GOAL #1   Title STG's = LTG's           PT Long Term Goals - 07/19/15 1216    PT LONG TERM GOAL #1   Title Pt will independently perform HEP to maximize functional gains made in PT.   (Target date: 07/30/15)   Status Achieved   PT LONG TERM GOAL #2   Title Positional vertigo testing will be negative to indicate resolved BPPV.    (Target date: 07/30/15)   Status Achieved   PT LONG TERM GOAL #3   Title Pt will decrease DHI score from 68 to < / = 56 to indicate significant decrease in pt-perceived disability due to dizziness.   (Modified target date: 08/09/15)   Status On-going   PT LONG TERM GOAL #4   Title Assess strength and balance, if indicated, after vertigo clears.    (Target date: 07/30/15)   Status Achieved   PT LONG TERM GOAL #5   Title Pt will improve DGI score from 12/24 to > 19/24 to indicate decreased risk of falling.   (Target date: 08/09/15)   Status On-going   PT LONG TERM GOAL #6   Title Pt will demonstrate 65 degrees of bilateral cervical spine rotation AROM to indicate progress toward normalized neck ROM.   (Target date: 08/09/15)   Status On-going               Plan - 07/21/15 2218    Clinical Impression Statement Session focused on increasing cervical spine mobility, increasing reliance on vestibular input. Suspect pt continuing to experience motion  sensitivity with supine > sit, as postural hypotension ruled out during this session. Pt reporting no headache, no dizziness x4 days, and exhibits improved cervical spine rotation AROM.   Pt will benefit from skilled therapeutic intervention in order to improve on the following deficits Abnormal gait;Decreased knowledge of use of  DME;Dizziness;Pain;Decreased balance;Decreased range of motion;Postural dysfunction   Rehab Potential Good   Clinical Impairments Affecting Rehab Potential multi-canal BPPV (recently resolved)   PT Frequency 2x / week   PT Duration 4 weeks   PT Treatment/Interventions ADLs/Self Care Home Management;Gait training;Stair training;Functional mobility training;Therapeutic activities;Therapeutic exercise;Balance training;Neuromuscular re-education;Manual techniques;Vestibular;Canalith Repostioning;DME Instruction;Patient/family education   PT Next Visit Plan Consider SOT to direct treatment. Continue to address soft tissue at cervical spine prn.    Consulted and Agree with Plan of Care Patient        Problem List Patient Active Problem List   Diagnosis Date Noted  . Hyperparathyroidism ([place]) 08/24/2014  . Slow transit constipation 01/13/2014  . IBS (irritable bowel syndrome), GERD, hx colon polyps, chronic abd pain - followed by Dr. Sharlett Iles in GI 06/11/2012  . Osteoporosis 08/29/2011  . Vitamin D deficiency 08/29/2011  . GERD (gastroesophageal reflux disease) 11/23/2010  . HYPERCHOLESTEROLEMIA 09/09/2007  . Essential hypertension 09/09/2007   Billie Ruddy, PT, DPT [facility] [address] [place], [place], [address] Phone: [phone]   Fax:  [phone] 07/21/2015, 10:24 PM  Name: Dynasti Kerman MRN: [id number] Date of Birth: 17-Jan-1934

## 2015-07-21 NOTE — Patient Instructions (Addendum)
Feet Together (Compliant Surface) Varied Arm Positions - Eyes Closed    Stand on 2 pillows with feet together and arms at sides. Close eyes and visualize upright position. Hold__30__ seconds. Repeat __3__ times per session. Do __2-3__ sessions per day.  Copyright  VHI. All rights reserved.   Feet Apart (Compliant Surface) Head Motion - Eyes Closed    Stand on 2 pillows with feet shoulder width apart. Close eyes and move head slowly, up and down 5 times; right to left 5 times. Repeat __3__ times per session. Do _2-3___ sessions per day.  Copyright  VHI. All rights reserved.    Gaze Stabilization: Standing Feet Apart (Compliant Surface)    Feet apart on pillow, keeping eyes on target on wall _3-5___ feet away, tilt head down 15-30 and move head side to side for __30__ seconds. Repeat while moving head up and down for __30__ seconds. Do __2-3__ sessions per day. Copyright  VHI. All rights reserved.

## 2015-07-27 ENCOUNTER — Ambulatory Visit: Payer: Medicare Other | Admitting: Physical Therapy

## 2015-07-27 DIAGNOSIS — R42 Dizziness and giddiness: Secondary | ICD-10-CM

## 2015-07-27 DIAGNOSIS — R269 Unspecified abnormalities of gait and mobility: Secondary | ICD-10-CM

## 2015-07-27 DIAGNOSIS — M436 Torticollis: Secondary | ICD-10-CM | POA: Diagnosis not present

## 2015-07-27 DIAGNOSIS — R2681 Unsteadiness on feet: Secondary | ICD-10-CM | POA: Diagnosis not present

## 2015-07-27 DIAGNOSIS — H8110 Benign paroxysmal vertigo, unspecified ear: Secondary | ICD-10-CM | POA: Diagnosis not present

## 2015-07-27 NOTE — Patient Instructions (Addendum)
Tip Card 1.The goal of habituation training is to assist in decreasing symptoms of vertigo, dizziness, or nausea provoked by specific head and body motions. 2.These exercises may initially increase symptoms; however, be persistent and work through symptoms. With repetition and time, the exercises will assist in reducing or eliminating symptoms. 3.Exercises should be stopped and discussed with the therapist if you experience any of the following: - Sudden change or fluctuation in hearing - New onset of ringing in the ears, or increase in current intensity - Any fluid discharge from the ear - Severe pain in neck or back - Extreme nausea  Copyright  VHI. All rights reserved.  Rolling   With pillow under head, start on back. Roll to your right side.  Hold until dizziness stops, plus 20 seconds and then roll to the left side.  Hold until dizziness stops, plus 20 seconds.  Repeat sequence 5 times per session. Do 2 sessions per day.  Copyright  VHI. All rights reserved.  Sit to Side-Lying   Sit on edge of bed. Lie down onto the right side and hold until dizziness stops, plus 20 seconds.  Return to sitting and wait until dizziness stops, plus 20 seconds.  Repeat to the left side. Repeat sequence 5 times per session. Do 2 sessions per day.  Tobe, walk along the length of your countertop 5 times (down and back), twice per day. Start by holding on with one hand and decrease the amount of support (until you no longer need to hold on) as you get more comfortable.

## 2015-07-27 NOTE — Therapy (Signed)
[facility] [address] [place], [place], [address] Phone: [phone]   Fax:  [phone]  Physical Therapy Treatment  Patient Details  Name: Robin [name] MRN: [id number] Date of Birth: 18-Sep-1933 Referring Provider: Colin Benton, DO  Encounter Date: 07/27/2015      PT End of Session - 07/27/15 0938    Visit Number 8   Number of Visits 13   Date for PT Re-Evaluation 08/11/15   Authorization Type Medicare primary; BCBS secondary.  G Codes required   PT Start Time [phone]   PT Stop Time 0930   PT Time Calculation (min) 51 min   Equipment Utilized During Treatment Other (comment)  Balance Master and harness   Activity Tolerance Patient tolerated treatment well;No increased pain   Behavior During Therapy [facility] for tasks assessed/performed      Past Medical History  Diagnosis Date  . Hypertension   . Hyperlipemia   . Hyperparathyroidism [facility])     seeing Dr. Dwyane Dee in endocrinology  . External hemorrhoids without mention of complication   . GERD (gastroesophageal reflux disease)     hx PUD, Hiatal hernia  . Colon polyps     tubulovillous adenoma, 2010  . IBS (irritable bowel syndrome), GERD, hx colon polyps, chronic abd pain - followed by Dr. Sharlett Iles in GI 06/11/2012  . Osteoporosis 08/29/2011  . Vertigo     eval with neuro in 2011, brief recurrence 2016  . Headache     eval with neuro in 2011    Past Surgical History  Procedure Laterality Date  . Back surgery  1974  . Carpal tunnel release  2/10  . Tonsillectomy      There were no vitals filed for this visit.  Visit Diagnosis:  Unsteadiness  Stiffness of neck  Abnormality of gait  Dizziness and giddiness      Subjective Assessment - 07/27/15 0838    Subjective Pt reports no headache, ongoing neck stiffness but no pain.    Pertinent History PMH: HTN, HLD, GERD, hyperparathyroidism   Patient Stated Goals "I want to get rid of the dizziness."    Currently in Pain? No/denies                Vestibular Assessment - 07/27/15 0001    Balancemaster   Therapist, occupational Comment SOT composite score of 75% is above normative values for age/height. Sensory analysis reveals pt ability to use visual, vestibular, and somatosensory input for balance are all WNL for age/height.                 [facility] Adult PT Treatment/Exercise - 07/27/15 0001    Ambulation/Gait   Ambulation/Gait Yes   Ambulation/Gait Assistance 7: Independent   Ambulation Distance (Feet) 350 Feet   Assistive device None   Gait Pattern Within Functional Limits   Ambulation Surface Level;Indoor   Standardized Balance Assessment   Standardized Balance Assessment Dynamic Gait Index   Dynamic Gait Index   Level Surface Normal   Change in Gait Speed Mild Impairment   Gait with Horizontal Head Turns Normal   Gait with Vertical Head Turns Mild Impairment   Gait and Pivot Turn Normal   Step Over Obstacle Normal   Step Around Obstacles Normal   Steps Mild Impairment   Total Score 21                PT Education - 07/27/15 0948    Education provided Yes  Education Details Goals, progress, and DC plan. HEP: added gait with vertical head turns (due to DGI findings). Per pt request, added habituation HEP in case of recurrent BPPV in future. However, strongly recommended pt seek immediate medical attention if she experiences dizziness that is constant, occurs at rest, or doesn't change with movement or positional change.    Person(s) Educated Patient   Methods Explanation;Demonstration;Verbal cues;Handout   Comprehension Returned demonstration;Verbalized understanding          PT Short Term Goals - 07/02/15 1208    PT SHORT TERM GOAL #1   Title STG's = LTG's           PT Long Term Goals - 08-20-15 0908    PT LONG TERM GOAL #1   Title Pt will independently perform HEP to maximize functional gains made in  PT.   (Target date: 07/30/15)   Status Achieved   PT LONG TERM GOAL #2   Title Positional vertigo testing will be negative to indicate resolved BPPV.    (Target date: 07/30/15)   Status Achieved   PT LONG TERM GOAL #3   Title Pt will decrease DHI score from 68 to < / = 56 to indicate significant decrease in pt-perceived disability due to dizziness.   (Modified target date: 08/09/15)   Baseline Aug 20, 2022:  [place] = 10   Status Achieved   PT LONG TERM GOAL #4   Title Assess strength and balance, if indicated, after vertigo clears.    (Target date: 07/30/15)   Status Achieved   PT LONG TERM GOAL #5   Title Pt will improve DGI score from 12/24 to > 19/24 to indicate decreased risk of falling.   (Target date: 08/09/15)   Baseline 2022-08-20:  DGI score = 21/24   Status Achieved   PT LONG TERM GOAL #6   Title Pt will demonstrate 65 degrees of bilateral cervical spine rotation AROM to indicate progress toward normalized neck ROM.   (Target date: 08/09/15)   Baseline Aug 20, 2022: Cervial spine rotation AROM = 38* to R, 42* to L.   Although goal unmet, pt reports no significant pain and driving is no longer limited by neck stiffness.   Status Not Met               Plan - 08/20/15 0943    Clinical Impression Statement Pt has met 5 of 6 LTG's, has consistently reported no vertiginous symptoms, and reports cervical spine stiffness is no longer limiting driving or functional mobility. DGI score has improved from 12/24 to 21/24, suggesting improved dynamic gait stability and decreased risk of falling. SOT cmposite score of 75% is above normative values for pt's age/height, suggesting normal ability to utilize multi-sensory input for balance. Pt has met all short and long term goals and therefore will be discharged from outpatient PT at this time. Pt educated on goals, findings, progress, and DC plan. Pt verbalized understanding and was in full agreement with DC plan.   Consulted and Agree with Plan of Care Patient           G-Codes - Aug 20, 2015 0937    Functional Assessment Tool Used DHI = 10   Functional Limitation Self care   Self Care Goal Status (W0981) At least 40 percent but less than 60 percent impaired, limited or restricted   Self Care Discharge Status [phone]) At least 1 percent but less than 20 percent impaired, limited or restricted      Problem List Patient Active Problem List  Diagnosis Date Noted  . Hyperparathyroidism ([place]) 08/24/2014  . Slow transit constipation 01/13/2014  . IBS (irritable bowel syndrome), GERD, hx colon polyps, chronic abd pain - followed by Dr. Sharlett Iles in GI 06/11/2012  . Osteoporosis 08/29/2011  . Vitamin D deficiency 08/29/2011  . GERD (gastroesophageal reflux disease) 11/23/2010  . HYPERCHOLESTEROLEMIA 09/09/2007  . Essential hypertension 09/09/2007   PHYSICAL THERAPY DISCHARGE SUMMARY  Visits from Start of Care: 8  Current functional level related to goals / functional outcomes: See above goals and statuses for details.   Remaining deficits: Pt continues to demonstrate limited cervical spine rotation AROM; however, pt reports cervical spine stiffness is no longer limiting ability to check blind spot while driving.   Education / Equipment: HEP; SOT findings and functional implications; HEP progression.  Plan: Patient agrees to discharge.  Patient goals were met. Patient is being discharged due to meeting the stated rehab goals.  ?????    Billie Ruddy, PT, DPT [facility] [address] [place], [place], [address] Phone: [phone]   Fax:  [phone] 07/27/2015, 5:26 PM               Name: Tamantha Saline MRN: [id number] Date of Birth: [birth year]

## 2015-07-28 ENCOUNTER — Other Ambulatory Visit: Payer: Self-pay | Admitting: Family Medicine

## 2015-07-29 ENCOUNTER — Encounter: Payer: Medicare Other | Admitting: Physical Therapy

## 2015-08-02 ENCOUNTER — Other Ambulatory Visit: Payer: Self-pay | Admitting: Family Medicine

## 2015-08-09 ENCOUNTER — Encounter: Payer: Self-pay | Admitting: Family Medicine

## 2015-08-09 ENCOUNTER — Ambulatory Visit (INDEPENDENT_AMBULATORY_CARE_PROVIDER_SITE_OTHER): Payer: Medicare Other | Admitting: Family Medicine

## 2015-08-09 VITALS — BP 136/72 | HR 90 | Temp 99.0°F | Ht 61.5 in | Wt 147.9 lb

## 2015-08-09 DIAGNOSIS — E7800 Pure hypercholesterolemia, unspecified: Secondary | ICD-10-CM

## 2015-08-09 DIAGNOSIS — R2689 Other abnormalities of gait and mobility: Secondary | ICD-10-CM | POA: Diagnosis not present

## 2015-08-09 DIAGNOSIS — E669 Obesity, unspecified: Secondary | ICD-10-CM | POA: Diagnosis not present

## 2015-08-09 DIAGNOSIS — E213 Hyperparathyroidism, unspecified: Secondary | ICD-10-CM

## 2015-08-09 DIAGNOSIS — I10 Essential (primary) hypertension: Secondary | ICD-10-CM

## 2015-08-09 LAB — LIPID PANEL
CHOLESTEROL: 181 mg/dL (ref 0–200)
HDL: 61.5 mg/dL (ref >39.00)
LDL Cholesterol: 100 mg/dL — ABNORMAL HIGH (ref 0–99)
NONHDL: 119.59
Total CHOL/HDL Ratio: 3
Triglycerides: 100 mg/dL (ref 0.0–149.0)
VLDL: 20 mg/dL (ref 0.0–40.0)

## 2015-08-09 LAB — BASIC METABOLIC PANEL
BUN: 15 mg/dL (ref 6–23)
CALCIUM: 10.8 mg/dL — AB (ref 8.4–10.5)
CO2: 28 mEq/L (ref 19–32)
Chloride: 106 mEq/L (ref 96–112)
Creatinine, Ser: 0.66 mg/dL (ref 0.40–1.20)
GFR: 91.68 mL/min (ref >60.00)
GLUCOSE: 94 mg/dL (ref 70–99)
Potassium: 4.2 mEq/L (ref 3.5–5.1)
SODIUM: 142 meq/L (ref 135–145)

## 2015-08-09 NOTE — Progress Notes (Signed)
Pre visit review using our clinic review tool, if applicable. No additional management support is needed unless otherwise documented below in the visit note. 

## 2015-08-09 NOTE — Progress Notes (Signed)
HPI:  Follow up:  HTN: -meds: norvasc 5, lisinopril 40, lasix 20  BPPV: -s/p vestibular rehab -reports doing much better -Reports chronic mild sensation of poor balance for many years -denies headache, falls, weakness, numbness  HLD/Obesity: -meds: zetia, crestor 2.5mg  (due to reported intol higher dose) -lifestyle not great  Hyperparathyroidism: -sees endocrinologist, reports she keeps forgetting to schedule follow up -denies abd pain, psychosis, pain  ROS: See pertinent positives and negatives per HPI.  Past Medical History  Diagnosis Date  . Hypertension   . Hyperlipemia   . Hyperparathyroidism [facility])     seeing Dr. Dwyane Dee in endocrinology  . External hemorrhoids without mention of complication   . GERD (gastroesophageal reflux disease)     hx PUD, Hiatal hernia  . Colon polyps     tubulovillous adenoma, 2010  . IBS (irritable bowel syndrome), GERD, hx colon polyps, chronic abd pain - followed by Dr. Sharlett Iles in GI 06/11/2012  . Osteoporosis 08/29/2011  . Vertigo     eval with neuro in 2011, brief recurrence 2016  . Headache     eval with neuro in 2011    Past Surgical History  Procedure Laterality Date  . Back surgery  1974  . Carpal tunnel release  2/10  . Tonsillectomy      Family History  Problem Relation Age of Onset  . Colon cancer Neg Hx   . Hypertension Father   . Diabetes Sister   . Hypertension Sister   . Diabetes Brother   . Hypertension Brother   . CAD Mother     Died of MI at age 70  . CAD Brother     Died of MI at age 46  . Stroke Father   . Hypercalcemia Neg Hx     Social History   Social History  . Marital Status: Widowed    Spouse Name: N/A  . Number of Children: 2  . Years of Education: N/A   Occupational History  . retired   .     Social History Main Topics  . Smoking status: Never Smoker   . Smokeless tobacco: Never Used  . Alcohol Use: No  . Drug Use: No  . Sexual Activity: Not Asked   Other Topics Concern  .  None   Social History Narrative     Current outpatient prescriptions:  .  amLODipine (NORVASC) 5 MG tablet, Take 1 tablet (5 mg total) by mouth daily., Disp: 90 tablet, Rfl: 1 .  Bisacodyl (DULCOLAX PO), Take 5 mg by mouth daily as needed (constipation). As needed, Disp: , Rfl:  .  cetirizine (ZYRTEC) 10 MG tablet, Take 10 mg by mouth daily as needed for allergies., Disp: , Rfl:  .  esomeprazole (NEXIUM) 40 MG capsule, Take 1 capsule (40 mg total) by mouth 2 (two) times daily. (Patient taking differently: Take 40 mg by mouth daily as needed (acid reflux, heartburn). ), Disp: 180 capsule, Rfl: 3 .  furosemide (LASIX) 20 MG tablet, Take 1 tablet (20 mg total) by mouth daily., Disp: 90 tablet, Rfl: 1 .  lisinopril (PRINIVIL,ZESTRIL) 40 MG tablet, Take 1 tablet (40 mg total) by mouth daily., Disp: 90 tablet, Rfl: 1 .  meclizine (ANTIVERT) 50 MG tablet, Take 1 tablet (50 mg total) by mouth 3 (three) times daily as needed for dizziness., Disp: 30 tablet, Rfl: 0 .  rosuvastatin (CRESTOR) 5 MG tablet, Take 1/2 tablet by mouth daily., Disp: 45 tablet, Rfl: 1 .  ZETIA 10 MG tablet, TAKE 1  BY MOUTH DAILY, Disp: 90 tablet, Rfl: 1  EXAM:  Filed Vitals:   08/09/15 0853  BP: 136/72  Pulse: 90  Temp: 99 F (37.2 C)    Body mass index is 27.5 kg/(m^2).  GENERAL: vitals reviewed and listed above, alert, oriented, appears well hydrated and in no acute distress  HEENT: atraumatic, conjunttiva clear, no obvious abnormalities on inspection of external nose and ears  NECK: no obvious masses on inspection  LUNGS: clear to auscultation bilaterally, no wheezes, rales or rhonchi, good air movement  CV: HRRR, no peripheral edema  MS: moves all extremities without noticeable abnormality  PSYCH: pleasant and cooperative, no obvious depression or anxiety  ASSESSMENT AND PLAN:  Discussed the following assessment and plan:  Essential hypertension - Plan: Basic metabolic panel -better on recheck, she  is reluctant to change medications -labs -lifestyle recs -continue to monitor  HYPERCHOLESTEROLEMIA - Plan: Lipid Panel -FASTING labs today -cont current treatment  Obesity -lifestyle recs  Poor balance -we discussed possible serious and likely etiologies, workup and treatment, treatment risks and return precautions - discussed neurology eval -after this discussion, Robin [name] opted for no further evaluation at this time, caution with ambulation -of course, we advised [name]  to return or notify a doctor immediately if symptoms worsen or persist or new concerns arise.  Hyperparathyroidism ([place]) -advised follow up with endocrinologist, she agrees to schedule, number provided  -Patient advised to return or notify a doctor immediately if symptoms worsen or persist or new concerns arise.  Patient Instructions  BEFORE YOU LEAVE: -cancel any current appointments scheduled -schedule Medicare Wellness Visit in 3-4 months -labs  Please call today to set up follow up with the endocrinologist  -We have ordered labs or studies at this visit. It can take up to 1-2 weeks for results and processing. We will contact you with instructions IF your results are abnormal. Normal results will be released to your [facility]. If you have not heard from [place] or can not find your results in [facility] in 2 weeks please contact our office.  We recommend the following healthy lifestyle measures: - eat a healthy whole foods diet consisting of regular small meals composed of vegetables, fruits, beans, nuts, seeds, healthy meats such as white chicken and fish and whole grains.  - avoid sweets, white starchy foods, fried foods, fast food, processed foods, sodas, red meet and other fattening foods.  - get a least 150-300 minutes of aerobic exercise per week.            Colin Benton R.

## 2015-08-09 NOTE — Patient Instructions (Signed)
BEFORE YOU LEAVE: -cancel any current appointments scheduled -schedule Medicare Wellness Visit in 3-4 months -labs  Please call today to set up follow up with the endocrinologist  -We have ordered labs or studies at this visit. It can take up to 1-2 weeks for results and processing. We will contact you with instructions IF your results are abnormal. Normal results will be released to your [facility]. If you have not heard from [place] or can not find your results in [facility] in 2 weeks please contact our office.  We recommend the following healthy lifestyle measures: - eat a healthy whole foods diet consisting of regular small meals composed of vegetables, fruits, beans, nuts, seeds, healthy meats such as white chicken and fish and whole grains.  - avoid sweets, white starchy foods, fried foods, fast food, processed foods, sodas, red meet and other fattening foods.  - get a least 150-300 minutes of aerobic exercise per week.

## 2015-08-15 NOTE — Progress Notes (Signed)
Subjective:    Patient ID: Robin [name], female    DOB: May 05, 1934, 82 y.o.   MRN: [id number]  HPI  The state of at least three ongoing medical problems is addressed today, with interval history of each noted here: Pt returns for f/u of hyperparathyroidism (probably combined primary and secondary; first noted in 2009; she has never had urolithiasis, PUD, pancreatitis, depression, or bony fracture).  Denies cramps.  Vitamin-D deficiency: she denies numbness.  She has not recently taken any vitamin-D products.   HTN: she denies edema.   Past Medical History  Diagnosis Date  . Hypertension   . Hyperlipemia   . Hyperparathyroidism [facility])     seeing Dr. Dwyane Dee in endocrinology  . External hemorrhoids without mention of complication   . GERD (gastroesophageal reflux disease)     hx PUD, Hiatal hernia  . Colon polyps     tubulovillous adenoma, 2010  . IBS (irritable bowel syndrome), GERD, hx colon polyps, chronic abd pain - followed by Dr. Sharlett Iles in GI 06/11/2012  . Osteoporosis 08/29/2011  . Vertigo     eval with neuro in 2011, brief recurrence 2016  . Headache     eval with neuro in 2011    Past Surgical History  Procedure Laterality Date  . Back surgery  1974  . Carpal tunnel release  2/10  . Tonsillectomy      Social History   Social History  . Marital Status: Widowed    Spouse Name: N/A  . Number of Children: 2  . Years of Education: N/A   Occupational History  . retired   .     Social History Main Topics  . Smoking status: Never Smoker   . Smokeless tobacco: Never Used  . Alcohol Use: No  . Drug Use: No  . Sexual Activity: Not on file   Other Topics Concern  . Not on file   Social History Narrative    Current Outpatient Prescriptions on File Prior to Visit  Medication Sig Dispense Refill  . amLODipine (NORVASC) 5 MG tablet Take 1 tablet (5 mg total) by mouth daily. 90 tablet 1  . Bisacodyl (DULCOLAX PO) Take 5 mg by mouth daily as needed (constipation).  As needed    . cetirizine (ZYRTEC) 10 MG tablet Take 10 mg by mouth daily as needed for allergies.    . furosemide (LASIX) 20 MG tablet Take 1 tablet (20 mg total) by mouth daily. 90 tablet 1  . lisinopril (PRINIVIL,ZESTRIL) 40 MG tablet Take 1 tablet (40 mg total) by mouth daily. 90 tablet 1  . meclizine (ANTIVERT) 50 MG tablet Take 1 tablet (50 mg total) by mouth 3 (three) times daily as needed for dizziness. 30 tablet 0  . rosuvastatin (CRESTOR) 5 MG tablet Take 1/2 tablet by mouth daily. 45 tablet 1  . ZETIA 10 MG tablet TAKE 1 BY MOUTH DAILY 90 tablet 1  . esomeprazole (NEXIUM) 40 MG capsule Take 1 capsule (40 mg total) by mouth 2 (two) times daily. (Patient taking differently: Take 40 mg by mouth daily as needed (acid reflux, heartburn). ) 180 capsule 3   No current facility-administered medications on file prior to visit.    Allergies  Allergen Reactions  . Amoxicillin     REACTION: rash  . Metronidazole     REACTION: rash    Family History  Problem Relation Age of Onset  . Colon cancer Neg Hx   . Hypertension Father   . Diabetes Sister   .  Hypertension Sister   . Diabetes Brother   . Hypertension Brother   . CAD Mother     Died of MI at age 48  . CAD Brother     Died of MI at age 46  . Stroke Father   . Hypercalcemia Neg Hx     BP 136/80 mmHg  Pulse 69  Temp(Src) 98.5 F (36.9 C) (Oral)  Ht 5' 1.5" (1.562 m)  Wt 147 lb (66.679 kg)  BMI 27.33 kg/m2  SpO2 97%  Review of Systems Denies sob.  She has mild chronic constipation.    Objective:   Physical Exam VITAL SIGNS:  See vs page.   GENERAL: no distress.  Ext: no edema.        Assessment & Plan:  HTN: well-controlled off HCTZ Vit-D deficiency: due for recheck Hyperparathyroidism: due for recheck  Patient is advised the following: Patient Instructions  blood tests are requested for you today.  We'll let you know about the results. Even if the parathyroid is high today, it probably needs no  treatment--just to follow it.  Please come back for a follow-up appointment in 1 year.

## 2015-08-15 NOTE — Patient Instructions (Addendum)
blood tests are requested for you today.  We'll let you know about the results. Even if the parathyroid is high today, it probably needs no treatment--just to follow it.  Please come back for a follow-up appointment in 1 year.

## 2015-08-17 ENCOUNTER — Ambulatory Visit: Payer: Medicare Other | Admitting: Family Medicine

## 2015-08-17 ENCOUNTER — Encounter: Payer: Self-pay | Admitting: Endocrinology

## 2015-08-17 ENCOUNTER — Ambulatory Visit (INDEPENDENT_AMBULATORY_CARE_PROVIDER_SITE_OTHER): Payer: Medicare Other | Admitting: Endocrinology

## 2015-08-17 VITALS — BP 136/80 | HR 69 | Temp 98.5°F | Ht 61.5 in | Wt 147.0 lb

## 2015-08-17 DIAGNOSIS — E559 Vitamin D deficiency, unspecified: Secondary | ICD-10-CM

## 2015-08-17 LAB — VITAMIN D 25 HYDROXY (VIT D DEFICIENCY, FRACTURES): VITD: 18.58 ng/mL — AB (ref 30.00–100.00)

## 2015-08-18 ENCOUNTER — Other Ambulatory Visit: Payer: Self-pay | Admitting: Endocrinology

## 2015-08-18 DIAGNOSIS — E559 Vitamin D deficiency, unspecified: Secondary | ICD-10-CM

## 2015-08-18 LAB — PTH, INTACT AND CALCIUM
CALCIUM: 10.5 mg/dL (ref 8.4–10.5)
PTH: 79 pg/mL — ABNORMAL HIGH (ref 14–64)

## 2015-08-18 MED ORDER — VITAMIN D (ERGOCALCIFEROL) 1.25 MG (50000 UNIT) PO CAPS: 50000.0000 [IU] | ORAL_CAPSULE | ORAL | Status: DC

## 2015-08-23 ENCOUNTER — Telehealth: Payer: Self-pay | Admitting: Family Medicine

## 2015-08-23 DIAGNOSIS — R42 Dizziness and giddiness: Secondary | ICD-10-CM

## 2015-08-23 NOTE — Telephone Encounter (Signed)
Talked with her. She feels is the same as in the past. Has had vertigo fro a few days.  Does not feel worse or changed from prior episodes. She has not tried the meclizine. After a lengthy discussion opted for neuro consult. Advised if sig different or worsening or other symptoms prompt evaluation her or UCC/ED if after hours.  She has meclizine and home exercises to try in interim.

## 2015-08-23 NOTE — Telephone Encounter (Signed)
Patient would like a call to discuss her recurrent dizziness.

## 2015-08-23 NOTE — Telephone Encounter (Signed)
I called the pt and she stated she wanted to know what Dr Maudie Mercury thinks about her still feeling dizzy.  States she has been worse for the past 2 days in which she does not feel as if the room is spinning but now feels like she is "stumbling, head feels top heavy" and she was unable to stand this morning?

## 2015-09-17 ENCOUNTER — Telehealth: Payer: Self-pay | Admitting: *Deleted

## 2015-09-17 ENCOUNTER — Ambulatory Visit (INDEPENDENT_AMBULATORY_CARE_PROVIDER_SITE_OTHER): Payer: Medicare Other | Admitting: Neurology

## 2015-09-17 ENCOUNTER — Encounter: Payer: Self-pay | Admitting: Neurology

## 2015-09-17 VITALS — BP 160/80 | HR 90 | Ht 61.5 in | Wt 148.2 lb

## 2015-09-17 DIAGNOSIS — R42 Dizziness and giddiness: Secondary | ICD-10-CM | POA: Diagnosis not present

## 2015-09-17 DIAGNOSIS — I10 Essential (primary) hypertension: Secondary | ICD-10-CM

## 2015-09-17 DIAGNOSIS — R2681 Unsteadiness on feet: Secondary | ICD-10-CM | POA: Diagnosis not present

## 2015-09-17 DIAGNOSIS — I679 Cerebrovascular disease, unspecified: Secondary | ICD-10-CM

## 2015-09-17 MED ORDER — DIAZEPAM 5 MG PO TABS: ORAL_TABLET | ORAL | Status: DC

## 2015-09-17 NOTE — Telephone Encounter (Signed)
I called the pts home number and left a detailed message Dr Maudie Mercury received notes from Dr Tomi Likens and asked that she call back for an appt to follow up on her blood pressure.

## 2015-09-17 NOTE — Patient Instructions (Signed)
Since this is a new type of dizziness that has been ongoing for 3 months, we will check MRI of brain and internal auditory canals.  Further recommendations pending results.

## 2015-09-17 NOTE — Telephone Encounter (Signed)
-----   Message from Lucretia Kern, DO sent at 09/17/2015  4:04 PM EDT ----- Can you help her schedule follow up for her blood pressure. Thanks. ----- Message -----    From: Pieter Partridge, DO    Sent: 09/17/2015   3:18 PM      To: Lucretia Kern, DO

## 2015-09-17 NOTE — Progress Notes (Signed)
NEUROLOGY CONSULTATION NOTE  Robin [name] MRN: [id number] DOB: 1934-03-08  Referring provider: Dr.Kim Primary care provider: Dr. Maudie Mercury  Reason for consult:  dizziness  HISTORY OF PRESENT ILLNESS: Robin [name] is a 82 year old right-handed woman with hypertension, hypercholesterolemia, GERD and IBS who presents for dizziness.  History obtained by patient and ED and PCP notes.  Imaging of CT of head from 2016 and MRI/MRA of head from 2010 personally reviewed.  [name] has longstanding history of benign paroxysmal positional vertigo.  It is a spinning sensation associated with nausea and sometimes vomiting.  It is treated with vestibular rehab.  Her last episode of BPPV occurred in February.  There was no preceding upper respiratory or GI viral illness.  She underwent vestibular rehab.  The vertigo resolved, but she has since had a new type of dizziness.  When she is up and walking, she feels "top heavy" in the head and unsteady on her feet.  She denies hearing loss, tinnitus, nausea, headache, diplopia, slurred speech, focal numbness or weakness.  When she sits down, she feels fine.  Although, on rare occasions when she is sitting, she experiences a brief episode of lightheadedness and sensation of heat wave covering her body.  She has not had any falls.  .CT of head from 04/01/15,to evaluate a case of her typical vertigo, showed chronic small vessel disease but no acute intracranial process.  She did have MRI and MRA of the head back on 12/08/08 for headache and blurred vision.  It revealed extensive chronic small vessel ischemic changes in the periventricular subcortical white matter.  MRA of posterior circulation unremarkable.   She does not take aspirin due to her IBS and GERD.  PAST MEDICAL HISTORY: Past Medical History  Diagnosis Date  . Hypertension   . Hyperlipemia   . Hyperparathyroidism [facility])     seeing Dr. Dwyane Dee in endocrinology  . External hemorrhoids without mention of  complication   . GERD (gastroesophageal reflux disease)     hx PUD, Hiatal hernia  . Colon polyps     tubulovillous adenoma, 2010  . IBS (irritable bowel syndrome), GERD, hx colon polyps, chronic abd pain - followed by Dr. Sharlett Iles in GI 06/11/2012  . Osteoporosis 08/29/2011  . Vertigo     eval with neuro in 2011, brief recurrence 2016  . Headache     eval with neuro in 2011    PAST SURGICAL HISTORY: Past Surgical History  Procedure Laterality Date  . Back surgery  1974  . Carpal tunnel release  2/10  . Tonsillectomy      MEDICATIONS: Current Outpatient Prescriptions on File Prior to Visit  Medication Sig Dispense Refill  . amLODipine (NORVASC) 5 MG tablet Take 1 tablet (5 mg total) by mouth daily. 90 tablet 1  . Bisacodyl (DULCOLAX PO) Take 5 mg by mouth daily as needed (constipation). As needed    . cetirizine (ZYRTEC) 10 MG tablet Take 10 mg by mouth daily as needed for allergies.    . furosemide (LASIX) 20 MG tablet Take 1 tablet (20 mg total) by mouth daily. 90 tablet 1  . lisinopril (PRINIVIL,ZESTRIL) 40 MG tablet Take 1 tablet (40 mg total) by mouth daily. 90 tablet 1  . meclizine (ANTIVERT) 50 MG tablet Take 1 tablet (50 mg total) by mouth 3 (three) times daily as needed for dizziness. 30 tablet 0  . rosuvastatin (CRESTOR) 5 MG tablet Take 1/2 tablet by mouth daily. 45 tablet 1  . Vitamin D, Ergocalciferol, (  DRISDOL) 50000 units CAPS capsule Take 1 capsule (50,000 Units total) by mouth 2 (two) times a week. 8 capsule 0  . ZETIA 10 MG tablet TAKE 1 BY MOUTH DAILY 90 tablet 1  . esomeprazole (NEXIUM) 40 MG capsule Take 1 capsule (40 mg total) by mouth 2 (two) times daily. (Patient taking differently: Take 40 mg by mouth daily as needed (acid reflux, heartburn). ) 180 capsule 3   No current facility-administered medications on file prior to visit.    ALLERGIES: Allergies  Allergen Reactions  . Amoxicillin     REACTION: rash  . Metronidazole     REACTION: rash     FAMILY HISTORY: Family History  Problem Relation Age of Onset  . Colon cancer Neg Hx   . Hypertension Father   . Diabetes Sister   . Hypertension Sister   . Diabetes Brother   . Hypertension Brother   . CAD Mother     Died of MI at age 44  . CAD Brother     Died of MI at age 45  . Stroke Father   . Hypercalcemia Neg Hx     SOCIAL HISTORY: Social History   Social History  . Marital Status: Widowed    Spouse Name: N/A  . Number of Children: 2  . Years of Education: N/A   Occupational History  . retired   .     Social History Main Topics  . Smoking status: Never Smoker   . Smokeless tobacco: Never Used  . Alcohol Use: No  . Drug Use: No  . Sexual Activity: Not on file   Other Topics Concern  . Not on file   Social History Narrative   Lives alone in a one story home.  Has 2 children.  Retired from working for a bank.  Education: high school.     REVIEW OF SYSTEMS: Constitutional: No fevers, chills, or sweats, no generalized fatigue, change in appetite Eyes: No visual changes, double vision, eye pain Ear, nose and throat: No hearing loss, ear pain, nasal congestion, sore throat Cardiovascular: No chest pain, palpitations Respiratory:  No shortness of breath at rest or with exertion, wheezes GastrointestinaI: No nausea, vomiting, diarrhea, abdominal pain, fecal incontinence Genitourinary:  No dysuria, urinary retention or frequency Musculoskeletal:  No neck pain, back pain Integumentary: No rash, pruritus, skin lesions Neurological: as above Psychiatric: No depression, insomnia, anxiety Endocrine: No palpitations, fatigue, diaphoresis, mood swings, change in appetite, change in weight, increased thirst Hematologic/Lymphatic:  No anemia, purpura, petechiae. Allergic/Immunologic: no itchy/runny eyes, nasal congestion, recent allergic reactions, rashes  PHYSICAL EXAM: Filed Vitals:   09/17/15 1424  BP: 160/80  Pulse: 90   General: No acute distress.   Patient appears well-groomed.  Head:  Normocephalic/atraumatic Eyes:  fundi examined but not visualized Neck: supple, no paraspinal tenderness, full range of motion Back: No paraspinal tenderness Heart: regular rate and rhythm Lungs: Clear to auscultation bilaterally. Vascular: No carotid bruits. Neurological Exam: Mental status: alert and oriented to person, place, and time, recent and remote memory intact, fund of knowledge intact, attention and concentration intact, speech fluent and not dysarthric, language intact. Cranial nerves: CN I: not tested CN II: pupils equal, round and reactive to light, visual fields intact CN III, IV, VI:  full range of motion, no nystagmus, no ptosis CN V: facial sensation intact CN VII: upper and lower face symmetric CN VIII: hearing intact CN IX, X: gag intact, uvula midline CN XI: sternocleidomastoid and trapezius muscles intact CN XII:  tongue midline Bulk & Tone: normal, no fasciculations. Motor:  5/5 throughout  Sensation:  Pinprick and vibration sensation intact. Deep Tendon Reflexes:  2+ throughout, toes downgoing. Finger to nose testing:  Without dysmetria.  Heel to shin:  Without dysmetria.  Gait:  Appears a little cautious but normal station and stride.  Able to turn and tandem walk. Romberg with mild sway but not positive.  IMPRESSION & PLAN: 1.  Dizziness.  2.  Cerebrovascular disease 3.  HTN.   The dizziness may be slow recovery from the vestibulopathy in February versus chronic subjective dizziness versus anxiety versus cerbrovascular event.  Given that she has cerebrovascular disease and that this is a new type of dizziness causing gait issues which has not resolved after 3 months, MRI of brain and internal auditory canals is warranted.  After MRI is performed, I would recommend starting ASA EC 81mg  daily.  She should also follow up with Dr. Maudie Mercury to work on optimizing blood pressure control.  Blood pressure should be rechecked with Dr.  Maudie Mercury   Thank you for allowing me to take part in the care of this patient.  Metta Clines, DO  CC:  Colin Benton, DO

## 2015-09-20 ENCOUNTER — Ambulatory Visit (INDEPENDENT_AMBULATORY_CARE_PROVIDER_SITE_OTHER): Payer: Medicare Other | Admitting: Family Medicine

## 2015-09-20 ENCOUNTER — Encounter: Payer: Self-pay | Admitting: Family Medicine

## 2015-09-20 ENCOUNTER — Other Ambulatory Visit (INDEPENDENT_AMBULATORY_CARE_PROVIDER_SITE_OTHER): Payer: Medicare Other

## 2015-09-20 VITALS — BP 160/84 | HR 90 | Temp 99.0°F | Ht 61.5 in | Wt 147.6 lb

## 2015-09-20 DIAGNOSIS — I10 Essential (primary) hypertension: Secondary | ICD-10-CM | POA: Diagnosis not present

## 2015-09-20 DIAGNOSIS — E559 Vitamin D deficiency, unspecified: Secondary | ICD-10-CM

## 2015-09-20 LAB — VITAMIN D 25 HYDROXY (VIT D DEFICIENCY, FRACTURES): VITD: 47.84 ng/mL (ref 30.00–100.00)

## 2015-09-20 NOTE — Progress Notes (Signed)
HPI:  Robin [name] is a pleasant 82 year old here for an acute visit follow-up on her blood pressure. She saw her neurologist recently and her blood pressure was elevated at the appointment and she was told to follow up with [place]. Her current blood pressure medications include Lasix 20 mg, lisinopril 40 mg and Norvasc 5 mg. Her blood pressure was okay here and that her endocrinology appointment for the last few months. She reports she monitors her BP at home and is never above 130/70s. She often gets worked up for doctor appts and it will be more elevated. She currently is very stressed about getting an MRI in a few days and reports her blood pressure will not come back down until she has had the MRI. She agrees to follow up closely after MRI to recheck her blood pressure, but does not wish to change any treatment right now. She denies chest pain, shortness of breath, headache or swelling.  ROS: See pertinent positives and negatives per HPI.  Past Medical History  Diagnosis Date  . Hypertension   . Hyperlipemia   . Hyperparathyroidism [facility])     seeing Dr. Dwyane Dee in endocrinology  . External hemorrhoids without mention of complication   . GERD (gastroesophageal reflux disease)     hx PUD, Hiatal hernia  . Colon polyps     tubulovillous adenoma, 2010  . IBS (irritable bowel syndrome), GERD, hx colon polyps, chronic abd pain - followed by Dr. Sharlett Iles in GI 06/11/2012  . Osteoporosis 08/29/2011  . Vertigo     eval with neuro in 2011, brief recurrence 2016  . Headache     eval with neuro in 2011    Past Surgical History  Procedure Laterality Date  . Back surgery  1974  . Carpal tunnel release  2/10  . Tonsillectomy      Family History  Problem Relation Age of Onset  . Colon cancer Neg Hx   . Hypertension Father   . Diabetes Sister   . Hypertension Sister   . Diabetes Brother   . Hypertension Brother   . CAD Mother     Died of MI at age 47  . CAD Brother     Died of MI at age 88   . Stroke Father   . Hypercalcemia Neg Hx     Social History   Social History  . Marital Status: Widowed    Spouse Name: N/A  . Number of Children: 2  . Years of Education: N/A   Occupational History  . retired   .     Social History Main Topics  . Smoking status: Never Smoker   . Smokeless tobacco: Never Used  . Alcohol Use: No  . Drug Use: No  . Sexual Activity: Not Asked   Other Topics Concern  . None   Social History Narrative   Lives alone in a one story home.  Has 2 children.  Retired from working for a bank.  Education: high school.      Current outpatient prescriptions:  .  amLODipine (NORVASC) 5 MG tablet, Take 1 tablet (5 mg total) by mouth daily., Disp: 90 tablet, Rfl: 1 .  Bisacodyl (DULCOLAX PO), Take 5 mg by mouth daily as needed (constipation). As needed, Disp: , Rfl:  .  cetirizine (ZYRTEC) 10 MG tablet, Take 10 mg by mouth daily as needed for allergies., Disp: , Rfl:  .  diazepam (VALIUM) 5 MG tablet, Take 20 minutes prior to MRI, Disp: 1 tablet, Rfl:  0 .  furosemide (LASIX) 20 MG tablet, Take 1 tablet (20 mg total) by mouth daily., Disp: 90 tablet, Rfl: 1 .  lisinopril (PRINIVIL,ZESTRIL) 40 MG tablet, Take 1 tablet (40 mg total) by mouth daily., Disp: 90 tablet, Rfl: 1 .  meclizine (ANTIVERT) 50 MG tablet, Take 1 tablet (50 mg total) by mouth 3 (three) times daily as needed for dizziness., Disp: 30 tablet, Rfl: 0 .  rosuvastatin (CRESTOR) 5 MG tablet, Take 1/2 tablet by mouth daily., Disp: 45 tablet, Rfl: 1 .  Vitamin D, Ergocalciferol, (DRISDOL) 50000 units CAPS capsule, Take 1 capsule (50,000 Units total) by mouth 2 (two) times a week., Disp: 8 capsule, Rfl: 0 .  ZETIA 10 MG tablet, TAKE 1 BY MOUTH DAILY, Disp: 90 tablet, Rfl: 1 .  esomeprazole (NEXIUM) 40 MG capsule, Take 1 capsule (40 mg total) by mouth 2 (two) times daily. (Patient taking differently: Take 40 mg by mouth daily as needed (acid reflux, heartburn). ), Disp: 180 capsule, Rfl:  3  EXAM:  Filed Vitals:   09/20/15 1547 09/20/15 1551  BP: 146/74 160/84  Pulse: 90   Temp: 99 F (37.2 C)     Body mass index is 27.44 kg/(m^2).  GENERAL: vitals reviewed and listed above, alert, oriented, appears well hydrated and in no acute distress  HEENT: atraumatic, conjunttiva clear, no obvious abnormalities on inspection of external nose and ears  NECK: no obvious masses on inspection  LUNGS: clear to auscultation bilaterally, no wheezes, rales or rhonchi, good air movement  CV: HRRR, no peripheral edema  MS: moves all extremities without noticeable abnormality  PSYCH: pleasant and cooperative, no obvious depression or anxiety  ASSESSMENT AND PLAN:  Discussed the following assessment and plan:  Essential hypertension  -Blood pressure is little higher here today then it has been. She does have a history of running high at office visits, with improvement on recheck and normal values from home. She reports she has been checking her blood pressure recently at home and it does not run above 130 on the top and is in the 70s on the bottom. She does not wish to change her blood pressure medications as she feels this is related to stress about getting an MRI. She agrees to follow up closely for recheck by assistant after the MRI. She agrees to continue to monitor her blood pressure at home and call if any high readings or if it is running above 140 on the top or above 90 on the bottom. Advised to bring her cuff to her next blood pressure check. If it were to run high here again, would suggest increasing her Norvasc to 7.5 mg daily. -Patient advised to return or notify a doctor immediately if symptoms worsen or persist or new concerns arise.  There are no Patient Instructions on file for this visit.   Colin Benton R.

## 2015-09-20 NOTE — Progress Notes (Signed)
Pre visit review using our clinic review tool, if applicable. No additional management support is needed unless otherwise documented below in the visit note. 

## 2015-09-21 ENCOUNTER — Other Ambulatory Visit: Payer: Medicare Other

## 2015-09-21 LAB — PTH, INTACT AND CALCIUM
Calcium: 10.1 mg/dL (ref 8.4–10.5)
PTH: 84 pg/mL — AB (ref 14–64)

## 2015-09-22 ENCOUNTER — Ambulatory Visit
Admission: RE | Admit: 2015-09-22 | Discharge: 2015-09-22 | Disposition: A | Discharge status: Home / Self Care | Payer: Medicare Other | Source: Ambulatory Visit | Attending: Neurology | Admitting: Neurology

## 2015-09-22 DIAGNOSIS — R42 Dizziness and giddiness: Secondary | ICD-10-CM | POA: Diagnosis not present

## 2015-09-22 MED ORDER — GADOBENATE DIMEGLUMINE 529 MG/ML IV SOLN
12.0000 mL | Freq: Once | INTRAVENOUS | Status: AC | PRN
  Administered 2015-09-22: 12 mL via INTRAVENOUS

## 2015-09-24 ENCOUNTER — Telehealth: Payer: Self-pay

## 2015-09-24 NOTE — Telephone Encounter (Signed)
Message relayed to patient. Verbalized understanding and denied questions.   

## 2015-09-24 NOTE — Telephone Encounter (Signed)
-----   Message from Pieter Partridge, DO sent at 09/24/2015 12:52 PM EDT ----- MRI does not show any tumor or stroke to explain dizziness.  As expected, she has cerebrovascular disease, which we discussed when she was here.  I recommend starting aspirin 81mg  daily and to optimize blood pressure control

## 2015-09-25 ENCOUNTER — Other Ambulatory Visit: Payer: Medicare Other

## 2015-09-29 ENCOUNTER — Other Ambulatory Visit: Payer: Medicare Other

## 2015-10-13 ENCOUNTER — Other Ambulatory Visit: Payer: Self-pay

## 2015-10-13 DIAGNOSIS — Z1231 Encounter for screening mammogram for malignant neoplasm of breast: Secondary | ICD-10-CM

## 2015-10-21 ENCOUNTER — Encounter: Payer: Self-pay | Admitting: Family Medicine

## 2015-10-21 ENCOUNTER — Encounter: Payer: Self-pay | Admitting: *Deleted

## 2015-10-21 ENCOUNTER — Ambulatory Visit (INDEPENDENT_AMBULATORY_CARE_PROVIDER_SITE_OTHER): Payer: Medicare Other | Admitting: Family Medicine

## 2015-10-21 ENCOUNTER — Ambulatory Visit: Payer: Medicare Other | Admitting: *Deleted

## 2015-10-21 VITALS — BP 148/70 | HR 83 | Temp 98.1°F | Ht 61.5 in | Wt 147.7 lb

## 2015-10-21 VITALS — BP 170/92 | HR 65

## 2015-10-21 DIAGNOSIS — I10 Essential (primary) hypertension: Secondary | ICD-10-CM

## 2015-10-21 NOTE — Progress Notes (Signed)
HPI:  Acute visit for hypertension: -Meds: Amlodipine 5 mg, Lasix 20 mg, lisinopril 40 mg  -she monitors her blood pressure at home and bring a log and all readings 1teens-130s/70s; no higher readings -She forgot to bring her cuff -Gets very anxious at doctor appointments -She is very anxious as she does not want to change her increase her medications and refuses to do so today -Denies chest pain, shortness of breath, vision changes, headaches, swelling or palpitations  ROS: See pertinent positives and negatives per HPI.  Past Medical History  Diagnosis Date  . Hypertension   . Hyperlipemia   . Hyperparathyroidism [facility])     seeing Dr. Dwyane Dee in endocrinology  . External hemorrhoids without mention of complication   . GERD (gastroesophageal reflux disease)     hx PUD, Hiatal hernia  . Colon polyps     tubulovillous adenoma, 2010  . IBS (irritable bowel syndrome), GERD, hx colon polyps, chronic abd pain - followed by Dr. Sharlett Iles in GI 06/11/2012  . Osteoporosis 08/29/2011  . Vertigo     eval with neuro in 2011, brief recurrence 2016  . Headache     eval with neuro in 2011    Past Surgical History  Procedure Laterality Date  . Back surgery  1974  . Carpal tunnel release  2/10  . Tonsillectomy      Family History  Problem Relation Age of Onset  . Colon cancer Neg Hx   . Hypertension Father   . Diabetes Sister   . Hypertension Sister   . Diabetes Brother   . Hypertension Brother   . CAD Mother     Died of MI at age 34  . CAD Brother     Died of MI at age 46  . Stroke Father   . Hypercalcemia Neg Hx     Social History   Social History  . Marital Status: Widowed    Spouse Name: N/A  . Number of Children: 2  . Years of Education: N/A   Occupational History  . retired   .     Social History Main Topics  . Smoking status: Never Smoker   . Smokeless tobacco: Never Used  . Alcohol Use: No  . Drug Use: No  . Sexual Activity: Not Asked   Other Topics  Concern  . None   Social History Narrative   Lives alone in a one story home.  Has 2 children.  Retired from working for a bank.  Education: high school.      Current outpatient prescriptions:  .  amLODipine (NORVASC) 5 MG tablet, Take 1 tablet (5 mg total) by mouth daily., Disp: 90 tablet, Rfl: 1 .  Bisacodyl (DULCOLAX PO), Take 5 mg by mouth daily as needed (constipation). As needed, Disp: , Rfl:  .  cetirizine (ZYRTEC) 10 MG tablet, Take 10 mg by mouth daily as needed for allergies., Disp: , Rfl:  .  furosemide (LASIX) 20 MG tablet, Take 1 tablet (20 mg total) by mouth daily., Disp: 90 tablet, Rfl: 1 .  lisinopril (PRINIVIL,ZESTRIL) 40 MG tablet, Take 1 tablet (40 mg total) by mouth daily., Disp: 90 tablet, Rfl: 1 .  rosuvastatin (CRESTOR) 5 MG tablet, Take 1/2 tablet by mouth daily., Disp: 45 tablet, Rfl: 1 .  ZETIA 10 MG tablet, TAKE 1 BY MOUTH DAILY, Disp: 90 tablet, Rfl: 1 .  esomeprazole (NEXIUM) 40 MG capsule, Take 1 capsule (40 mg total) by mouth 2 (two) times daily. (Patient taking differently: Take  40 mg by mouth daily as needed (acid reflux, heartburn). ), Disp: 180 capsule, Rfl: 3  EXAM:  Filed Vitals:   10/21/15 1310  BP: 148/70  Pulse: 83  Temp: 98.1 F (36.7 C)    Body mass index is 27.46 kg/(m^2).  GENERAL: vitals reviewed and listed above, alert, oriented, appears well hydrated and in no acute distress  HEENT: atraumatic, conjunttiva clear, no obvious abnormalities on inspection of external nose and ears  NECK: no obvious masses on inspection  LUNGS: clear to auscultation bilaterally, no wheezes, rales or rhonchi, good air movement  CV: HRRR, faint sem, no peripheral edema  MS: moves all extremities without noticeable abnormality  PSYCH: pleasant and cooperative, no obvious depression or anxiety  ASSESSMENT AND PLAN:  Discussed the following assessment and plan:  Essential hypertension  -Discussed risk of untreated hypertension and treatment  options -She is adamant about not changing or increasing medications today, she really feels that she does have white coat hypertension -She unfortunately forgot to bring her blood pressure cuff today to ensure her cuff is working properly -She agrees to continue checking her blood pressure at home and bring her cuff to the next appointment -Patient advised to return or notify a doctor immediately if symptoms worsen or persist or new concerns arise.  Patient Instructions  Please keep your follow-up as scheduled  PLEASE bring your blood pressure cuff to next appointment  Check your blood pressure 3 times daily for 1 week, then daily at random times. Please keep a log and bring this log and her cuff to the next appointment.  Follow-up sooner if needed.     Colin Benton R.

## 2015-10-21 NOTE — Progress Notes (Signed)
Pre visit review using our clinic review tool, if applicable. No additional management support is needed unless otherwise documented below in the visit note. 

## 2015-10-21 NOTE — Patient Instructions (Signed)
Please keep your follow-up as scheduled  PLEASE bring your blood pressure cuff to next appointment  Check your blood pressure 3 times daily for 1 week, then daily at random times. Please keep a log and bring this log and her cuff to the next appointment.  Follow-up sooner if needed.

## 2015-11-22 ENCOUNTER — Telehealth: Payer: Self-pay | Admitting: Family Medicine

## 2015-11-22 ENCOUNTER — Ambulatory Visit: Payer: Medicare Other

## 2015-11-22 NOTE — Telephone Encounter (Signed)
Ok to refill x 90 days with 1 refill. Will not know if meds will need to be adjusted until we see her. Thanks.

## 2015-11-22 NOTE — Telephone Encounter (Addendum)
Pt has medicare well visit on 7/24 but will not have enough med to get her through to her appointment, because they must be sent   mailorder. Pt would like to know if Dr Maudie Mercury will refill, and if ok for her to get the 90 day refill required through [facility]. If Dr Maudie Mercury doesn't think her meds will need to be changed. If so, a 30 day ok, it is just more costly.  amLODipine (NORVASC) 5 MG tablet lisinopril (PRINIVIL,ZESTRIL) 40 MG tablet  [facility]

## 2015-11-23 MED ORDER — AMLODIPINE BESYLATE 5 MG PO TABS: 5.0000 mg | ORAL_TABLET | Freq: Every day | ORAL | Status: DC

## 2015-11-23 MED ORDER — LISINOPRIL 40 MG PO TABS: 40.0000 mg | ORAL_TABLET | Freq: Every day | ORAL | Status: DC

## 2015-11-23 NOTE — Telephone Encounter (Signed)
I called the pt and left a detailed message with the information below and refills were sent to [facility].

## 2015-11-23 NOTE — Addendum Note (Signed)
Addended by: Agnes Lawrence on: 11/23/2015 07:58 AM   Modules accepted: Orders

## 2015-12-06 ENCOUNTER — Encounter: Payer: Self-pay | Admitting: Family Medicine

## 2015-12-06 ENCOUNTER — Ambulatory Visit: Payer: Medicare Other | Admitting: Family Medicine

## 2015-12-06 ENCOUNTER — Ambulatory Visit (INDEPENDENT_AMBULATORY_CARE_PROVIDER_SITE_OTHER): Payer: Medicare Other | Admitting: Family Medicine

## 2015-12-06 VITALS — BP 138/80 | HR 65 | Temp 98.5°F | Ht 60.5 in | Wt 144.1 lb

## 2015-12-06 DIAGNOSIS — K219 Gastro-esophageal reflux disease without esophagitis: Secondary | ICD-10-CM | POA: Diagnosis not present

## 2015-12-06 DIAGNOSIS — E7800 Pure hypercholesterolemia, unspecified: Secondary | ICD-10-CM | POA: Diagnosis not present

## 2015-12-06 DIAGNOSIS — I10 Essential (primary) hypertension: Secondary | ICD-10-CM

## 2015-12-06 DIAGNOSIS — Z0000 Encounter for general adult medical examination without abnormal findings: Secondary | ICD-10-CM

## 2015-12-06 LAB — CBC
HEMATOCRIT: 41.4 % (ref 36.0–46.0)
Hemoglobin: 13.9 g/dL (ref 12.0–15.0)
MCHC: 33.6 g/dL (ref 30.0–36.0)
MCV: 92.3 fl (ref 78.0–100.0)
Platelets: 254 10*3/uL (ref 150.0–400.0)
RBC: 4.49 Mil/uL (ref 3.87–5.11)
RDW: 12.8 % (ref 11.5–15.5)
WBC: 5.5 10*3/uL (ref 4.0–10.5)

## 2015-12-06 LAB — LIPID PANEL
CHOLESTEROL: 182 mg/dL (ref 0–200)
HDL: 63.9 mg/dL (ref >39.00)
LDL Cholesterol: 101 mg/dL — ABNORMAL HIGH (ref 0–99)
NONHDL: 118.38
Total CHOL/HDL Ratio: 3
Triglycerides: 85 mg/dL (ref 0.0–149.0)
VLDL: 17 mg/dL (ref 0.0–40.0)

## 2015-12-06 LAB — BASIC METABOLIC PANEL
BUN: 16 mg/dL (ref 6–23)
CHLORIDE: 105 meq/L (ref 96–112)
CO2: 28 mEq/L (ref 19–32)
CREATININE: 0.64 mg/dL (ref 0.40–1.20)
Calcium: 10.6 mg/dL — ABNORMAL HIGH (ref 8.4–10.5)
GFR: 94.91 mL/min (ref >60.00)
Glucose, Bld: 90 mg/dL (ref 70–99)
POTASSIUM: 4.4 meq/L (ref 3.5–5.1)
Sodium: 141 mEq/L (ref 135–145)

## 2015-12-06 MED ORDER — AMLODIPINE BESYLATE 5 MG PO TABS: 5.0000 mg | ORAL_TABLET | Freq: Every day | ORAL | 3 refills | Status: DC

## 2015-12-06 MED ORDER — ROSUVASTATIN CALCIUM 5 MG PO TABS: ORAL_TABLET | ORAL | 1 refills | Status: DC

## 2015-12-06 MED ORDER — ROSUVASTATIN CALCIUM 5 MG PO TABS: ORAL_TABLET | ORAL | 3 refills | Status: DC

## 2015-12-06 MED ORDER — EZETIMIBE 10 MG PO TABS: ORAL_TABLET | ORAL | 3 refills | Status: DC

## 2015-12-06 MED ORDER — LISINOPRIL 40 MG PO TABS: 40.0000 mg | ORAL_TABLET | Freq: Every day | ORAL | 3 refills | Status: DC

## 2015-12-06 NOTE — Progress Notes (Signed)
Medicare Annual Preventive Care Visit  (initial annual wellness or annual wellness exam)  Concerns and/or follow up today:  HTN: -she keeps home log, we have checked her cuff - her cuff runs a little high, despite this home BPs in the 120-130/60-70s on detailed log -lisinopril 40mg , norvasc 5mg  -endo had rxd lasix, she stopped it on her own and reports has not had any issues with swelling -denies: increased swelling, CP, DOE -reports BP runs good at home  HLD: -meds: crestor 2.5mg  daily, zetia 10mg  -hx statin intol  Hypercalcemia -seening endo  GERD: -she stopped taking her PPI, used to take nexium 40, reports takes rarely when has symptoms -hx hiatal hernia and gastritis  ROS: negative for report of fevers, unintentional weight loss, vision changes, vision loss, hearing loss or change, chest pain, sob, hemoptysis, melena, hematochezia, hematuria, genital discharge or lesions, falls, bleeding or bruising, loc, thoughts of suicide or self harm, memory loss  1.) Patient-completed health risk assessment  - completed and reviewed, see scanned documentation  2.) Review of Medical History: -PMH, PSH, Family History and current specialty and care providers reviewed and updated and listed below  - see scanned in document in chart and below  Past Medical History:  Diagnosis Date  . Colon polyps    tubulovillous adenoma, 2010  . External hemorrhoids without mention of complication   . GERD (gastroesophageal reflux disease)    hx PUD, Hiatal hernia  . Headache    eval with neuro in 2011  . Hyperlipemia   . Hyperparathyroidism [facility])    seeing Dr. Dwyane Dee in endocrinology  . Hypertension   . IBS (irritable bowel syndrome), GERD, hx colon polyps, chronic abd pain - followed by Dr. Sharlett Iles in GI 06/11/2012  . Osteoporosis 08/29/2011  . Vertigo    eval with neuro in 2011, brief recurrence 2016    Past Surgical History:  Procedure Laterality Date  . BACK SURGERY  1974  . CARPAL  TUNNEL RELEASE  2/10  . TONSILLECTOMY      Social History   Social History  . Marital status: Widowed    Spouse name: N/A  . Number of children: 2  . Years of education: N/A   Occupational History  . retired   .  Retired   Social History Main Topics  . Smoking status: Never Smoker  . Smokeless tobacco: Never Used  . Alcohol use No  . Drug use: No  . Sexual activity: Not on file   Other Topics Concern  . Not on file   Social History Narrative   Lives alone in a one story home.  Has 2 children.  Retired from working for a bank.  Education: high school.     Family History  Problem Relation Age of Onset  . Colon cancer Neg Hx   . Hypertension Father   . Diabetes Sister   . Hypertension Sister   . Diabetes Brother   . Hypertension Brother   . CAD Mother     Died of MI at age 10  . CAD Brother     Died of MI at age 67  . Stroke Father   . Hypercalcemia Neg Hx     Current Outpatient Prescriptions on File Prior to Visit  Medication Sig Dispense Refill  . amLODipine (NORVASC) 5 MG tablet Take 1 tablet (5 mg total) by mouth daily. 90 tablet 1  . Bisacodyl (DULCOLAX PO) Take 5 mg by mouth daily as needed (constipation). As needed    .  lisinopril (PRINIVIL,ZESTRIL) 40 MG tablet Take 1 tablet (40 mg total) by mouth daily. 90 tablet 1  . rosuvastatin (CRESTOR) 5 MG tablet Take 1/2 tablet by mouth daily. 45 tablet 1  . ZETIA 10 MG tablet TAKE 1 BY MOUTH DAILY 90 tablet 1   No current facility-administered medications on file prior to visit.      3.) Review of functional ability and level of safety:  Any difficulty hearing?  NO  History of falling? 1 mechanical fall in yard when she tripped running  Any trouble with IADLs - using a phone, using transportation, grocery shopping, preparing meals, doing housework, doing laundry, taking medications and managing money? NO  Advance Directives? NO; declined information today  See summary of recommendations in Patient  Instructions below.  4.) Physical Exam Vitals:   12/06/15 0804  BP: 138/80  Pulse: 65  Temp: 98.5 F (36.9 C)   Estimated body mass index is 27.68 kg/m as calculated from the following:   Height as of this encounter: 5' 0.5" (1.537 m).   Weight as of this encounter: 144 lb 1.6 oz (65.4 kg). Body mass index is 27.68 kg/m.  EKG (optional): deferred  General: alert, appear well hydrated and in no acute distress  HEENT: visual acuity grossly intact  CV: HRRR  Lungs: CTA bilaterally  Psych: pleasant and cooperative, no obvious depression or anxiety  Cognitive function grossly intact  See patient instructions for recommendations.  Education and counseling regarding the above review of health provided with a plan for the following: -see scanned patient completed form for further details -fall prevention strategies discussed  -healthy lifestyle discussed -importance and resources for completing advanced directives discussed -see patient instructions below for any other recommendations provided  4)The following written screening schedule of preventive measures were reviewed with assessment and plan made per below, orders and patient instructions:      AAA screening done if applicable     Alcohol screening done     Obesity Screening and counseling done     STI screening (Hep C if born [birth year]) offered and per pt wishes     Tobacco Screening done       Pneumococcal (PPSV23 -one dose after 64, one before if risk factors), influenza yearly and hepatitis B vaccines (if high risk - end stage renal disease, IV drugs, homosexual men, live in home for mentally retarded, hemophilia receiving factors) ASSESSMENT/PLAN: done       Screening mammograph (yearly if >40) ASSESSMENT/PLAN: 7, 2016. sees Dr. Toney Rakes, gyn for breast health      Screening Pap smear/pelvic exam (q2 years) ASSESSMENT/PLAN: sees Dr. Toney Rakes, gyn       Prostate cancer screening ASSESSMENT/PLAN: n/a,  declined      Colorectal cancer screening (FOBT yearly or flex sig q4y or colonoscopy q10y or barium enema q4y) ASSESSMENT/PLAN: 10/2013; 3 year repeat advised      Diabetes outpatient self-management training services ASSESSMENT/PLAN: utd or done      Bone mass measurements(covered q2y if indicated - estrogen def, osteoporosis, hyperparathyroid, vertebral abnormalities, osteoporosis or steroids) ASSESSMENT/PLAN: utd, done 08/2014 with Dr. Toney Rakes      Screening for glaucoma(q1y if high risk - diabetes, FH, AA and > 80 or hispanic and > 44) ASSESSMENT/PLAN: utd or advised      Medical nutritional therapy for individuals with diabetes or renal disease ASSESSMENT/PLAN: see orders      Cardiovascular screening blood tests (lipids q5y) ASSESSMENT/PLAN: see orders and labs      Diabetes  screening tests ASSESSMENT/PLAN: see orders and labs   7.) Summary:  Medicare annual wellness visit, subsequent  HYPERCHOLESTEROLEMIA - Plan: Lipid panel  Essential hypertension - Plan: Basic metabolic panel, CBC (no diff)  Gastroesophageal reflux disease, esophagitis presence not specified   -risk factors and conditions per above assessment were discussed and treatment, recommendations and referrals were offered per documentation above and orders and patient instructions.    Patient Instructions  BEFORE YOU LEAVE: -follow up: 4 months -labs -refill meds x 1 year to pharmacy of her choice  Please get your gynecology exam with your gynecologist.  Please do your mammogram on Monday as planned.  Please see a lawyer and/or go to this website to help you with advanced directives and designating a health care power of attorney so that your wishes will be followed should you become too ill to make your own medical decisions. [email or web address]   We recommend the following healthy lifestyle: 1) Small portions - eat off of salad plate instead of dinner plate 2) Eat a  healthy clean diet with avoidance of (less then 1 serving per week) processed foods, sweetened drinks, white starches, red meat, fast foods and sweets and consisting of: * 5-9 servings per day of fresh or frozen fruits and vegetables (not corn or potatoes, not dried or canned) *nuts and seeds, beans *olives and olive oil *small portions of lean meats such as fish and white chicken  *small portions of whole grains 3)Get at least 150 minutes of sweaty aerobic exercise per week 4)reduce stress - counseling, meditation, relaxation to balance other aspects of your life  We have ordered labs or studies at this visit. It can take up to 1-2 weeks for results and processing. IF results require follow up or explanation, we will call you with instructions. Clinically stable results will be released to your [facility]. If you have not heard from [place] or cannot find your results in [facility] in 2 weeks please contact our office at [phone].  If you are not yet signed up for [facility], please consider signing up.          Colin Benton R., DO

## 2015-12-06 NOTE — Patient Instructions (Signed)
BEFORE YOU LEAVE: -follow up: 4 months -labs -refill meds x 1 year to pharmacy of her choice  Please get your gynecology exam with your gynecologist.  Please do your mammogram on Monday as planned.  Please see a lawyer and/or go to this website to help you with advanced directives and designating a health care power of attorney so that your wishes will be followed should you become too ill to make your own medical decisions. [email or web address]   We recommend the following healthy lifestyle: 1) Small portions - eat off of salad plate instead of dinner plate 2) Eat a healthy clean diet with avoidance of (less then 1 serving per week) processed foods, sweetened drinks, white starches, red meat, fast foods and sweets and consisting of: * 5-9 servings per day of fresh or frozen fruits and vegetables (not corn or potatoes, not dried or canned) *nuts and seeds, beans *olives and olive oil *small portions of lean meats such as fish and white chicken  *small portions of whole grains 3)Get at least 150 minutes of sweaty aerobic exercise per week 4)reduce stress - counseling, meditation, relaxation to balance other aspects of your life  We have ordered labs or studies at this visit. It can take up to 1-2 weeks for results and processing. IF results require follow up or explanation, we will call you with instructions. Clinically stable results will be released to your [facility]. If you have not heard from [place] or cannot find your results in [facility] in 2 weeks please contact our office at [phone].  If you are not yet signed up for [facility], please consider signing up.

## 2015-12-06 NOTE — Addendum Note (Signed)
Addended by: Agnes Lawrence on: 12/06/2015 09:06 AM   Modules accepted: Orders

## 2015-12-06 NOTE — Progress Notes (Signed)
Pre visit review using our clinic review tool, if applicable. No additional management support is needed unless otherwise documented below in the visit note. 

## 2015-12-13 ENCOUNTER — Ambulatory Visit
Admission: RE | Admit: 2015-12-13 | Discharge: 2015-12-13 | Disposition: A | Discharge status: Home / Self Care | Payer: Medicare Other | Source: Ambulatory Visit

## 2015-12-13 DIAGNOSIS — Z1231 Encounter for screening mammogram for malignant neoplasm of breast: Secondary | ICD-10-CM

## 2016-02-10 ENCOUNTER — Ambulatory Visit (INDEPENDENT_AMBULATORY_CARE_PROVIDER_SITE_OTHER): Payer: Medicare Other | Admitting: Physician Assistant

## 2016-02-10 ENCOUNTER — Encounter (INDEPENDENT_AMBULATORY_CARE_PROVIDER_SITE_OTHER): Payer: Self-pay

## 2016-02-10 ENCOUNTER — Encounter: Payer: Self-pay | Admitting: Physician Assistant

## 2016-02-10 VITALS — BP 132/72 | HR 72 | Ht 61.0 in | Wt 144.4 lb

## 2016-02-10 DIAGNOSIS — K648 Other hemorrhoids: Secondary | ICD-10-CM

## 2016-02-10 DIAGNOSIS — K2970 Gastritis, unspecified, without bleeding: Secondary | ICD-10-CM | POA: Diagnosis not present

## 2016-02-10 DIAGNOSIS — R1013 Epigastric pain: Secondary | ICD-10-CM

## 2016-02-10 DIAGNOSIS — K644 Residual hemorrhoidal skin tags: Secondary | ICD-10-CM

## 2016-02-10 MED ORDER — PANTOPRAZOLE SODIUM 40 MG PO TBEC: 40.0000 mg | DELAYED_RELEASE_TABLET | Freq: Every day | ORAL | 3 refills | Status: DC

## 2016-02-10 MED ORDER — HYDROCORTISONE 2.5 % RE CREA: TOPICAL_CREAM | RECTAL | 1 refills | Status: DC

## 2016-02-10 NOTE — Progress Notes (Signed)
Reviewed and agree with initial management plan.  Malcolm T. Stark, MD FACG 

## 2016-02-10 NOTE — Patient Instructions (Addendum)
We sent prescriptions to [facility].  1. Pantoprazole sodium ( Protonix) 40 mg.  2. Anusol HC Cream  You can get Recticare-Lidocaine cream, use 2-3 times daily.  We have given you a coupon.   Call and make an appointment in 4-6 weeks if symptoms persist. With Amy or Dr. Fuller Plan.

## 2016-02-10 NOTE — Progress Notes (Signed)
Subjective:    Patient ID: Robin [name], female    DOB: 11/14/1933, 82 y.o.   MRN: [id number]  HPI Robin [name] is a pleasant 82 year old white female known to Dr. Fuller Plan. She has history of hypertension, hyperlipidemia, GERD, IBS diverticulosis and colon polyps. She comes in today with 2 complaints. She says she has an  external "tag" that she is worrying about because she has been seeing some intermittent blood. Her other issue is left upper quadrant and epigastric discomfort which she says she has had in the past. She develop symptoms over the past month or so and just finished a course of OTC Nexium and a probiotic over the past 14 days. She says she feels she is about 65% better still has some residual discomfort. She had not been on any aspirin or NSAIDs or other new medications. She describes the discomfort as a soreness. Her appetite has been fine and weight has been stable, sometimes feels a little bit worse after eating. There's been no nausea or vomiting and no melena. She last had EGD in 2012 per Dr. Sharlett Iles with finding of a nodular antrum had mild gastritis CLOtest and was negative. Last colonoscopy June 2015, large internal hemorrhoids were noted mild left colon diverticulosis and she had a 5 mm polyp in the transverse colon which was hyperplastic. As far as her external hemorrhoidal tag she says this has  blood intermittently for a long time but seems more irritated lately. She says she has to wear a pad because of bladder leakage and wonders if that is irritating the hemorrhoid. She is concerned about this "turning into a cancer". Have been normal and she's not noticed any blood in her stool.  Review of Systems Pertinent positive and negative review of systems were noted in the above HPI section.  All other review of systems was otherwise negative.  Outpatient Encounter Prescriptions as of 02/10/2016  Medication Sig  . amLODipine (NORVASC) 5 MG tablet Take 1 tablet (5 mg total) by mouth  daily.  . Bisacodyl (DULCOLAX PO) Take 5 mg by mouth daily as needed (constipation). As needed  . ezetimibe (ZETIA) 10 MG tablet TAKE 1 BY MOUTH DAILY  . hydrocortisone (ANUSOL-HC) 2.5 % rectal cream Apply to the rectal area 2-3 times daily as needed.  Marland Kitchen lisinopril (PRINIVIL,ZESTRIL) 40 MG tablet Take 1 tablet (40 mg total) by mouth daily.  . pantoprazole (PROTONIX) 40 MG tablet Take 1 tablet (40 mg total) by mouth daily.  . rosuvastatin (CRESTOR) 5 MG tablet Take 1/2 tablet by mouth daily.   No facility-administered encounter medications on file as of 02/10/2016.    Allergies  Allergen Reactions  . Amoxicillin     REACTION: rash  . Metronidazole     REACTION: rash   Patient Active Problem List   Diagnosis Date Noted  . Hyperparathyroidism ([place]) 08/24/2014  . Slow transit constipation 01/13/2014  . IBS (irritable bowel syndrome), GERD, hx colon polyps, chronic abd pain - followed by Dr. Sharlett Iles in GI 06/11/2012  . Osteoporosis 08/29/2011  . Vitamin D deficiency 08/29/2011  . GERD (gastroesophageal reflux disease) 11/23/2010  . HYPERCHOLESTEROLEMIA 09/09/2007  . Essential hypertension 09/09/2007   Social History   Social History  . Marital status: Widowed    Spouse name: N/A  . Number of children: 2  . Years of education: N/A   Occupational History  . retired   .  Retired   Social History Main Topics  . Smoking status: Never Smoker  . Smokeless  tobacco: Never Used  . Alcohol use No  . Drug use: No  . Sexual activity: Not on file   Other Topics Concern  . Not on file   Social History Narrative   Lives alone in a one story home.  Has 2 children.  Retired from working for a bank.  Education: high school.     Ms. Kofron family history includes CAD in her brother and mother; Diabetes in her brother and sister; Heart disease in her sister; Hypertension in her brother, father, and sister; Stroke in her father.      Objective:    Vitals:   02/10/16 1036  BP:  132/72  Pulse: 72    Physical Exam  well-developed elderly white female in no acute distress, pleasant blood pressure 132/72 pulse 72 BMI 27.2. HEENT ;nontraumatic normocephalic EOMI PERRLA sclera anicteric, Cardiovascular ;regular rate and rhythm with S1-S2 no murmur or gallop, Pulmonary; clear bilaterally, Abdomen ;soft she has some minimal tenderness in the epigastrium there is no guarding or rebound no palpable mass or hepatosplenomegaly bowel sounds are present, Rectal; exam digital exam is negative stool negative she has a small to medium-sized external hemorrhoid mildly inflamed, EXt; no clubbing cyanosis or edema skin warm dry, Neuropsych ;mood and affect appropriate       Assessment & Plan:   #46 82 year old female with recent mild epigastric and left upper quadrant discomfort improved but not resolved after a short course of OTC Nexium. I suspect she has had some mild gastritis #2 external hemorrhoids symptomatic #3 diverticulosis #4 history of IBS  Plan; we'll treat with Protonix 40 mg by mouth every morning 6 weeks Management of external hemorrhoidal symptoms discussed with the patient. My advice she try OTC recticare/lidocaine gel 2-3 times daily as needed for discomfort, and we'll also send a prescription for Anusol HC cream for her to use 2-3 times daily on a when necessary basis when the hemorrhoid is inflamed. She is asked to follow up with Dr. Fuller Plan or myself in 4-6 weeks if she continues to have epigastric pain despite a longer course of PPI therapy. At that point she may need EGD or further imaging.  Robin [name] S Dion Sibal PA-C 02/10/2016   Cc: Lucretia Kern, DO

## 2016-02-14 ENCOUNTER — Telehealth: Payer: Self-pay | Admitting: *Deleted

## 2016-02-14 NOTE — Telephone Encounter (Signed)
LM for the patient to advise we made her an appointment on 03-14-2016 at 9:30 am. I LM that she can cancel it if she is feeling significantly better. However, let her know Amy would be glad to see her again if she is still having problems.

## 2016-02-21 DIAGNOSIS — H40011 Open angle with borderline findings, low risk, right eye: Secondary | ICD-10-CM | POA: Diagnosis not present

## 2016-02-21 DIAGNOSIS — H5203 Hypermetropia, bilateral: Secondary | ICD-10-CM | POA: Diagnosis not present

## 2016-02-21 DIAGNOSIS — H40012 Open angle with borderline findings, low risk, left eye: Secondary | ICD-10-CM | POA: Diagnosis not present

## 2016-02-21 DIAGNOSIS — D3131 Benign neoplasm of right choroid: Secondary | ICD-10-CM | POA: Diagnosis not present

## 2016-02-22 ENCOUNTER — Ambulatory Visit (INDEPENDENT_AMBULATORY_CARE_PROVIDER_SITE_OTHER): Payer: Medicare Other

## 2016-02-22 DIAGNOSIS — Z23 Encounter for immunization: Secondary | ICD-10-CM

## 2016-03-14 ENCOUNTER — Encounter: Payer: Self-pay | Admitting: Physician Assistant

## 2016-03-14 ENCOUNTER — Ambulatory Visit (INDEPENDENT_AMBULATORY_CARE_PROVIDER_SITE_OTHER): Payer: Medicare Other | Admitting: Physician Assistant

## 2016-03-14 ENCOUNTER — Other Ambulatory Visit (INDEPENDENT_AMBULATORY_CARE_PROVIDER_SITE_OTHER): Payer: Medicare Other

## 2016-03-14 VITALS — BP 138/86 | HR 78 | Ht 61.0 in | Wt 145.0 lb

## 2016-03-14 DIAGNOSIS — R1013 Epigastric pain: Secondary | ICD-10-CM

## 2016-03-14 DIAGNOSIS — R141 Gas pain: Secondary | ICD-10-CM

## 2016-03-14 DIAGNOSIS — R140 Abdominal distension (gaseous): Secondary | ICD-10-CM

## 2016-03-14 LAB — COMPREHENSIVE METABOLIC PANEL
ALT: 11 U/L (ref 0–35)
AST: 15 U/L (ref 0–37)
Albumin: 4.5 g/dL (ref 3.5–5.2)
Alkaline Phosphatase: 96 U/L (ref 39–117)
BUN: 16 mg/dL (ref 6–23)
CHLORIDE: 104 meq/L (ref 96–112)
CO2: 27 mEq/L (ref 19–32)
Calcium: 10.7 mg/dL — ABNORMAL HIGH (ref 8.4–10.5)
Creatinine, Ser: 0.67 mg/dL (ref 0.40–1.20)
GFR: 89.96 mL/min (ref >60.00)
GLUCOSE: 89 mg/dL (ref 70–99)
POTASSIUM: 4 meq/L (ref 3.5–5.1)
SODIUM: 139 meq/L (ref 135–145)
Total Bilirubin: 0.6 mg/dL (ref 0.2–1.2)
Total Protein: 7 g/dL (ref 6.0–8.3)

## 2016-03-14 LAB — CBC WITH DIFFERENTIAL/PLATELET
BASOS PCT: 0.4 % (ref 0.0–3.0)
Basophils Absolute: 0 10*3/uL (ref 0.0–0.1)
EOS PCT: 1.4 % (ref 0.0–5.0)
Eosinophils Absolute: 0.1 10*3/uL (ref 0.0–0.7)
HCT: 41.5 % (ref 36.0–46.0)
Hemoglobin: 14.1 g/dL (ref 12.0–15.0)
LYMPHS ABS: 1.5 10*3/uL (ref 0.7–4.0)
Lymphocytes Relative: 20.8 % (ref 12.0–46.0)
MCHC: 33.8 g/dL (ref 30.0–36.0)
MCV: 92.1 fl (ref 78.0–100.0)
MONO ABS: 0.5 10*3/uL (ref 0.1–1.0)
Monocytes Relative: 7 % (ref 3.0–12.0)
NEUTROS ABS: 5 10*3/uL (ref 1.4–7.7)
NEUTROS PCT: 70.4 % (ref 43.0–77.0)
PLATELETS: 260 10*3/uL (ref 150.0–400.0)
RBC: 4.51 Mil/uL (ref 3.87–5.11)
RDW: 13.3 % (ref 11.5–15.5)
WBC: 7.1 10*3/uL (ref 4.0–10.5)

## 2016-03-14 LAB — C-REACTIVE PROTEIN: CRP: 0.1 mg/dL — ABNORMAL LOW (ref 0.5–20.0)

## 2016-03-14 MED ORDER — OMEPRAZOLE 40 MG PO CPDR: 40.0000 mg | DELAYED_RELEASE_CAPSULE | Freq: Every day | ORAL | 3 refills | Status: DC

## 2016-03-14 NOTE — Progress Notes (Signed)
Subjective:    Patient ID: Robin [name], female    DOB: 1933-07-26, 82 y.o.   MRN: [id number]  HPI Robin [name] is a pleasant 82 year old white female known to Dr. Fuller Plan. She was last seen in the office about a month ago by myself with complaints of epigastric and left upper quadrant discomfort which was partially improved after a short course of OTC Nexium. It was felt that her symptoms were most consistent with gastritis. She was also concerned at that time about a hemorrhoidal tag which was symptomatic. We started next 40 mg every morning and asked her to follow-up. She comes in today stating that she was unable to take the proton X which caused her an increase in gas and bloating and discomfort. She discontinued this but had has not been taking any acid blocker on a regular basis over the past month. He continues to have epigastric burning type discomfort associated with bloating and gas across the upper abdomen. She has no nausea or vomiting appetite is been fine and weight has been stable. She does feel that her symptoms are exacerbated by eating. No recent changes in her bowel habits no melena or hematochezia. She's not taking any aspirin or NSAIDs. Last colonoscopy was done in thousand 15 showing large internal hemorrhoids and mild left colon diverticulosis she also had a 5 mm polyp removed which was hyperplastic. Last EGD in 2012 per Dr. Sharlett Iles showed a nodular antrum and mild gastritis CLOtest was negative.  Review of Systems Pertinent positive and negative review of systems were noted in the above HPI section.  All other review of systems was otherwise negative.  Outpatient Encounter Prescriptions as of 03/14/2016  Medication Sig  . amLODipine (NORVASC) 5 MG tablet Take 1 tablet (5 mg total) by mouth daily.  . Bisacodyl (DULCOLAX PO) Take 5 mg by mouth daily as needed (constipation). As needed  . ezetimibe (ZETIA) 10 MG tablet TAKE 1 BY MOUTH DAILY  . hydrocortisone (ANUSOL-HC) 2.5 %  rectal cream Apply to the rectal area 2-3 times daily as needed.  Marland Kitchen lisinopril (PRINIVIL,ZESTRIL) 40 MG tablet Take 1 tablet (40 mg total) by mouth daily.  . rosuvastatin (CRESTOR) 5 MG tablet Take 1/2 tablet by mouth daily.  . [DISCONTINUED] pantoprazole (PROTONIX) 40 MG tablet Take 1 tablet (40 mg total) by mouth daily.  Marland Kitchen omeprazole (PRILOSEC) 40 MG capsule Take 1 capsule (40 mg total) by mouth daily.   No facility-administered encounter medications on file as of 03/14/2016.    Allergies  Allergen Reactions  . Amoxicillin     REACTION: rash  . Metronidazole     REACTION: rash  . Protonix [Pantoprazole Sodium] Swelling   Patient Active Problem List   Diagnosis Date Noted  . Hyperparathyroidism ([place]) 08/24/2014  . Slow transit constipation 01/13/2014  . IBS (irritable bowel syndrome), GERD, hx colon polyps, chronic abd pain - followed by Dr. Sharlett Iles in GI 06/11/2012  . Osteoporosis 08/29/2011  . Vitamin D deficiency 08/29/2011  . GERD (gastroesophageal reflux disease) 11/23/2010  . HYPERCHOLESTEROLEMIA 09/09/2007  . Essential hypertension 09/09/2007   Social History   Social History  . Marital status: Widowed    Spouse name: N/A  . Number of children: 2  . Years of education: N/A   Occupational History  . retired   .  Retired   Social History Main Topics  . Smoking status: Never Smoker  . Smokeless tobacco: Never Used  . Alcohol use No  . Drug use: No  . Sexual  activity: Not on file   Other Topics Concern  . Not on file   Social History Narrative   Lives alone in a one story home.  Has 2 children.  Retired from working for a bank.  Education: high school.     Ms. Smoak family history includes CAD in her brother and mother; Diabetes in her brother and sister; Heart disease in her sister; Hypertension in her brother, father, and sister; Stroke in her father.      Objective:    Vitals:   03/14/16 0922  BP: 138/86  Pulse: 78    Physical Exam   well-developed elderly white female in no acute distress, pleasant blood pressure 138/86 pulse 78, BMI 27.4. HEENT; nontraumatic normocephalic EOMI PERRLA sclera anicteric, Cardiovascular r;egular rate and rhythm with S1-S2 no murmur or gallop, Pulmonary ;clear bilaterally, Abdomen; soft mildly tender in the epigastrium there is no guarding or rebound no palpable mass or hepatosplenomegaly bowel sounds are present, Rectal; exam not done, Extremities; no clubbing cyanosis or edema skin warm and dry, Neuropsych; mood and affect appropriate       Assessment & Plan:   #51 82 year old white female with 2-3 month history of upper abdominal discomfort burning in nature bloating and gas, thus far unresponsive to OTC PPI. Unfortunately patient did not tolerate prescription strength protonix but did not call for an alternative. Rule out gastritis, peptic ulcer disease, nonulcer dyspepsia or occult malignancy #2 diverticulosis #3 hyperplastic colon polyp at time of last colonoscopy 2015 #4  IBS  Plan; check CBC with differential and be met Schedule for CT of the abdomen and pelvis with contrast Patient would like to avoid a procedure if possible We'll try omeprazole 40 mg by mouth every morning 4-6 weeks to be taken daily Add trial of FD gard 2 by mouth twice a day He will need EGD per Dr. Fuller Plan if above measures not helpful and CT s negative.   Amy S Esterwood PA-C 03/14/2016   Cc: Lucretia Kern, DO

## 2016-03-14 NOTE — Progress Notes (Signed)
Reviewed and agree with management plan.  Tomoki Lucken T. Carman Essick, MD FACG 

## 2016-03-14 NOTE — Patient Instructions (Addendum)
Please go to the basement level to have your labs drawn.  We sent a prescription for Omeprazole 40 mg, to y our pharmacy. [place].   We have given  You samples of FD Gard, Take 1 capsule twice daily.  You can get this at [facility]  Or [facility]. You can ask [facility] if they have it.   You have been scheduled for a CT scan of the abdomen and pelvis at [facility] ([address]---this is in the same building as Press photographer).   You are scheduled on Monday 03-20-2016 at 3:30 PM. You should arrive at 3:15  to your appointment time for registration. Please follow the written instructions below on the day of your exam:  WARNING: IF YOU ARE ALLERGIC TO IODINE/X-RAY DYE, PLEASE NOTIFY RADIOLOGY IMMEDIATELY AT [phone]! YOU WILL BE GIVEN A 13 HOUR PREMEDICATION PREP.  1) Do not eat or drink anything after 11:30 am (4 hours prior to your test) 2) You have been given 2 bottles of oral contrast to drink. The solution may taste               better if refrigerated, but do NOT add ice or any other liquid to this solution. Shake             well before drinking.    Drink 1 bottle of contrast @ 1:30 Pm (2 hours prior to your exam)  Drink 1 bottle of contrast @ 2:30 PM (1 hour prior to your exam)  You may take any medications as prescribed with a small amount of water except for the following: Metformin, Glucophage, Glucovance, Avandamet, Riomet, Fortamet, Actoplus Met, Janumet, Glumetza or Metaglip. The above medications must be held the day of the exam AND 48 hours after the exam.  The purpose of you drinking the oral contrast is to aid in the visualization of your intestinal tract. The contrast solution may cause some diarrhea. Before your exam is started, you will be given a small amount of fluid to drink. Depending on your individual set of symptoms, you may also receive an intravenous injection of x-ray contrast/dye. Plan on being at [facility] for 30  minutes or long, depending on the type of exam you are having performed.  If you have any questions regarding your exam or if you need to reschedule, you may call the CT department at [phone] between the hours of 8:00 am and 5:00 pm, Monday-Friday.  ________________________________________________________________________

## 2016-03-15 ENCOUNTER — Telehealth: Payer: Self-pay | Admitting: Gastroenterology

## 2016-03-15 NOTE — Telephone Encounter (Signed)
See lab results for additional details.  

## 2016-03-20 ENCOUNTER — Ambulatory Visit (INDEPENDENT_AMBULATORY_CARE_PROVIDER_SITE_OTHER)
Admission: RE | Admit: 2016-03-20 | Discharge: 2016-03-20 | Disposition: A | Discharge status: Home / Self Care | Payer: Medicare Other | Source: Ambulatory Visit | Attending: Physician Assistant | Admitting: Physician Assistant

## 2016-03-20 DIAGNOSIS — R1013 Epigastric pain: Secondary | ICD-10-CM | POA: Diagnosis not present

## 2016-03-20 DIAGNOSIS — K760 Fatty (change of) liver, not elsewhere classified: Secondary | ICD-10-CM | POA: Diagnosis not present

## 2016-03-20 DIAGNOSIS — R141 Gas pain: Secondary | ICD-10-CM

## 2016-03-20 DIAGNOSIS — R140 Abdominal distension (gaseous): Secondary | ICD-10-CM

## 2016-03-20 MED ORDER — IOPAMIDOL (ISOVUE-300) INJECTION 61%
100.0000 mL | Freq: Once | INTRAVENOUS | Status: AC | PRN
  Administered 2016-03-20: 100 mL via INTRAVENOUS

## 2016-03-27 ENCOUNTER — Ambulatory Visit (INDEPENDENT_AMBULATORY_CARE_PROVIDER_SITE_OTHER): Payer: Medicare Other | Admitting: Family Medicine

## 2016-03-27 ENCOUNTER — Encounter: Payer: Self-pay | Admitting: Family Medicine

## 2016-03-27 VITALS — BP 132/88 | HR 75 | Temp 98.3°F | Ht 61.0 in | Wt 146.1 lb

## 2016-03-27 DIAGNOSIS — E7800 Pure hypercholesterolemia, unspecified: Secondary | ICD-10-CM | POA: Diagnosis not present

## 2016-03-27 DIAGNOSIS — K219 Gastro-esophageal reflux disease without esophagitis: Secondary | ICD-10-CM

## 2016-03-27 DIAGNOSIS — J069 Acute upper respiratory infection, unspecified: Secondary | ICD-10-CM | POA: Diagnosis not present

## 2016-03-27 DIAGNOSIS — I10 Essential (primary) hypertension: Secondary | ICD-10-CM | POA: Diagnosis not present

## 2016-03-27 DIAGNOSIS — Z6827 Body mass index (BMI) 27.0-27.9, adult: Secondary | ICD-10-CM

## 2016-03-27 DIAGNOSIS — E559 Vitamin D deficiency, unspecified: Secondary | ICD-10-CM

## 2016-03-27 MED ORDER — BENZONATATE 100 MG PO CAPS: 100.0000 mg | ORAL_CAPSULE | Freq: Two times a day (BID) | ORAL | 0 refills | Status: DC | PRN

## 2016-03-27 NOTE — Progress Notes (Signed)
Pre visit review using our clinic review tool, if applicable. No additional management support is needed unless otherwise documented below in the visit note. 

## 2016-03-27 NOTE — Patient Instructions (Addendum)
BEFORE YOU LEAVE: -follow up: cancel appt for next week and schedule 3 month follow up  Vit D3 [phone] IU daily (costo or sam's club brand or source naturals drops have been independently tested by consumer labs)  DO NOT take any calcium or multivitamins.  INSTRUCTIONS FOR UPPER RESPIRATORY INFECTION:  -plenty of rest and fluids  -nasal saline wash 2-3 times daily (use prepackaged nasal saline or bottled/distilled water if making your own)   -can use AFRIN nasal spray for drainage and nasal congestion - but do NOT use longer then 3-4 days  -can use tylenol (in no history of liver disease) as directed for aches and sorethroat  -in the winter time, using a humidifier at night is helpful (please follow cleaning instructions)  -if you are taking a cough medication - use only as directed, may also try a teaspoon of honey to coat the throat and throat lozenges.  -for sore throat, salt water gargles can help  -follow up if you have fevers, facial pain, tooth pain, difficulty breathing or are worsening or symptoms persist longer then expected  Upper Respiratory Infection, Adult An upper respiratory infection (URI) is also known as the common cold. It is often caused by a type of germ (virus). Colds are easily spread (contagious). You can pass it to others by kissing, coughing, sneezing, or drinking out of the same glass. Usually, you get better in 1 to 3  weeks.  However, the cough can last for even longer. HOME CARE   Only take medicine as told by your doctor. Follow instructions provided above.  Drink enough water and fluids to keep your pee (urine) clear or pale yellow.  Get plenty of rest.  Return to work when your temperature is < 100 for 24 hours or as told by your doctor. You may use a face mask and wash your hands to stop your cold from spreading. GET HELP RIGHT AWAY IF:   After the first few days, you feel you are getting worse.  You have questions about your  medicine.  You have chills, shortness of breath, or red spit (mucus).  You have pain in the face for more then 1-2 days, especially when you bend forward.  You have a fever, puffy (swollen) neck, pain when you swallow, or white spots in the back of your throat.  You have a bad headache, ear pain, sinus pain, or chest pain.  You have a high-pitched whistling sound when you breathe in and out (wheezing).  You cough up blood.  You have sore muscles or a stiff neck. MAKE SURE YOU:   Understand these instructions.  Will watch your condition.  Will get help right away if you are not doing well or get worse. Document Released: 10/18/2007 Document Revised: 07/24/2011 Document Reviewed: 08/06/2013 [facility] Patient Information 2015 [place], [place]. This information is not intended to replace advice given to you by your health care provider. Make sure you discuss any questions you have with your health care provider.   We recommend the following healthy lifestyle for LIFE: 1) Small portions.   Tip: eat off of a salad plate instead of a dinner plate.  Tip: It is ok to feel hungry after a meal - that likely means you ate an appropriate portion.  Tip: if you need more or a snack choose fruits, veggies and/or a handful of nuts or seeds.  2) Eat a healthy clean diet.  * Tip: Avoid (less then 1 serving per week): processed foods, sweets,  sweetened drinks, white starches (rice, flour, bread, potatoes, pasta, etc), red meat, fast foods, butter  *Tip: CHOOSE instead   * 5-9 servings per day of fresh or frozen fruits and vegetables (but not corn, potatoes, bananas, canned or dried fruit)   *nuts and seeds, beans   *olives and olive oil   *small portions of lean meats such as fish and white chicken    *small portions of whole grains  3)Get at least 150 minutes of sweaty aerobic exercise per week.  4)Reduce stress - consider counseling, meditation and relaxation to balance other aspects of your  life.

## 2016-03-27 NOTE — Progress Notes (Signed)
HPI:  Acute visit and follow up:  URI -started: about 1 week ago -symptoms:nasal congestion, sore throat, cough, PND -denies:fever, SOB, NVD, tooth pain -has tried: cough drops - wanted to know what else she could take for a cold -sick contacts/travel/risks: no reported flu, strep or tick exposure  She has past medical history significant for hypertension, hyperlipidemia, acid reflux, IBS, and elevated calcium with vitamin D deficiency. She is seeing her gastroenterologist for the gastrointestinal issues and had labs recently. These were okay other than the mildly elevated calcium. Her endocrine notes, was thought to be secondary to her history of vitamin D deficiency. She currently is not taking any vitamin D. She otherwise feels well. No chest pain, shortness of breath, swelling her issues with her blood pressure or cholesterol medications.   ROS: See pertinent positives and negatives per HPI.  Past Medical History:  Diagnosis Date  . Colon polyps    tubulovillous adenoma, 2010  . External hemorrhoids without mention of complication   . GERD (gastroesophageal reflux disease)    hx PUD, Hiatal hernia  . Headache    eval with neuro in 2011  . Hyperlipemia   . Hyperparathyroidism [facility])    seeing Dr. Dwyane Dee in endocrinology  . Hypertension   . IBS (irritable bowel syndrome), GERD, hx colon polyps, chronic abd pain - followed by Dr. Sharlett Iles in GI 06/11/2012  . Osteoporosis 08/29/2011  . Vertigo    eval with neuro in 2011, brief recurrence 2016    Past Surgical History:  Procedure Laterality Date  . BACK SURGERY  1974  . CARPAL TUNNEL RELEASE  2/10  . TONSILLECTOMY      Family History  Problem Relation Age of Onset  . Diabetes Brother   . Hypertension Brother   . Hypertension Father   . Stroke Father   . Diabetes Sister   . Hypertension Sister   . Heart disease Sister   . CAD Mother     Died of MI at age 40  . CAD Brother     Died of MI at age 64  . Colon cancer  Neg Hx   . Hypercalcemia Neg Hx     Social History   Social History  . Marital status: Widowed    Spouse name: N/A  . Number of children: 2  . Years of education: N/A   Occupational History  . retired   .  Retired   Social History Main Topics  . Smoking status: Never Smoker  . Smokeless tobacco: Never Used  . Alcohol use No  . Drug use: No  . Sexual activity: Not Asked   Other Topics Concern  . None   Social History Narrative   Lives alone in a one story home.  Has 2 children.  Retired from working for a bank.  Education: high school.      Current Outpatient Prescriptions:  .  amLODipine (NORVASC) 5 MG tablet, Take 1 tablet (5 mg total) by mouth daily., Disp: 90 tablet, Rfl: 3 .  Bisacodyl (DULCOLAX PO), Take 5 mg by mouth daily as needed (constipation). As needed, Disp: , Rfl:  .  ezetimibe (ZETIA) 10 MG tablet, TAKE 1 BY MOUTH DAILY, Disp: 90 tablet, Rfl: 3 .  hydrocortisone (ANUSOL-HC) 2.5 % rectal cream, Apply to the rectal area 2-3 times daily as needed., Disp: 30 g, Rfl: 1 .  lisinopril (PRINIVIL,ZESTRIL) 40 MG tablet, Take 1 tablet (40 mg total) by mouth daily., Disp: 90 tablet, Rfl: 3 .  omeprazole (PRILOSEC) 40 MG capsule, Take 1 capsule (40 mg total) by mouth daily., Disp: 90 capsule, Rfl: 3 .  rosuvastatin (CRESTOR) 5 MG tablet, Take 1/2 tablet by mouth daily., Disp: 45 tablet, Rfl: 1 .  benzonatate (TESSALON) 100 MG capsule, Take 1 capsule (100 mg total) by mouth 2 (two) times daily as needed for cough., Disp: 20 capsule, Rfl: 0  EXAM:  Vitals:   03/27/16 1008  BP: 132/88  Pulse: 75  Temp: 98.3 F (36.8 C)    Body mass index is 27.61 kg/m.  GENERAL: vitals reviewed and listed above, alert, oriented, appears well hydrated and in no acute distress  HEENT: atraumatic, conjunttiva clear, no obvious abnormalities on inspection of external nose and ears, normal appearance of ear canals and TMs, clear nasal congestion, mild post oropharyngeal erythema with  PND, no tonsillar edema or exudate, no sinus TTP  NECK: no obvious masses on inspection  LUNGS: clear to auscultation bilaterally, no wheezes, rales or rhonchi, good air movement  CV: HRRR, no peripheral edema  MS: moves all extremities without noticeable abnormality  PSYCH: pleasant and cooperative, no obvious depression or anxiety  ASSESSMENT AND PLAN:  Discussed the following assessment and plan:  Acute upper respiratory infection -given HPI and exam findings today, a serious infection or illness is unlikely. We discussed potential etiologies, with VURI being most likely, and advised supportive care and monitoring. Tessalon for cough. We discussed treatment side effects, likely course, antibiotic misuse, transmission, and signs of developing a serious illness. -of course, we advised to return or notify a doctor immediately if symptoms worsen or persist or new concerns arise.  Essential hypertension -Better on recheck, stable, continue current medications  -Reviewed recent labs from GI   HYPERCHOLESTEROLEMIA -Continue current medications, lifestyle recommendations   Gastroesophageal reflux disease, esophagitis presence not specified -Sees GI   Vitamin D deficiency -Advised daily recommended amount of vitamin D to keep levels within normal range, most patients in this part of the world with and indoor lifestyle do need a daily supplement of vitamin D to maintain normal levels  -Advised against the calcium supplement or multivitamin  -Advised she follow-up with her endocrinologist as planned regarding the chronic mildly elevated calcium  BMI 27.0-27.9,adult -lifestyle recommendations      Patient Instructions  BEFORE YOU LEAVE: -follow up: cancel appt for next week and schedule 3 month follow up  Vit D3 [phone] IU daily (costo or sam's club brand or source naturals drops have been independently tested by consumer labs)  DO NOT take any calcium or  multivitamins.  INSTRUCTIONS FOR UPPER RESPIRATORY INFECTION:  -plenty of rest and fluids  -nasal saline wash 2-3 times daily (use prepackaged nasal saline or bottled/distilled water if making your own)   -can use AFRIN nasal spray for drainage and nasal congestion - but do NOT use longer then 3-4 days  -can use tylenol (in no history of liver disease) as directed for aches and sorethroat  -in the winter time, using a humidifier at night is helpful (please follow cleaning instructions)  -if you are taking a cough medication - use only as directed, may also try a teaspoon of honey to coat the throat and throat lozenges.  -for sore throat, salt water gargles can help  -follow up if you have fevers, facial pain, tooth pain, difficulty breathing or are worsening or symptoms persist longer then expected  Upper Respiratory Infection, Adult An upper respiratory infection (URI) is also known as the common cold. It is  often caused by a type of germ (virus). Colds are easily spread (contagious). You can pass it to others by kissing, coughing, sneezing, or drinking out of the same glass. Usually, you get better in 1 to 3  weeks.  However, the cough can last for even longer. HOME CARE   Only take medicine as told by your doctor. Follow instructions provided above.  Drink enough water and fluids to keep your pee (urine) clear or pale yellow.  Get plenty of rest.  Return to work when your temperature is < 100 for 24 hours or as told by your doctor. You may use a face mask and wash your hands to stop your cold from spreading. GET HELP RIGHT AWAY IF:   After the first few days, you feel you are getting worse.  You have questions about your medicine.  You have chills, shortness of breath, or red spit (mucus).  You have pain in the face for more then 1-2 days, especially when you bend forward.  You have a fever, puffy (swollen) neck, pain when you swallow, or white spots in the back of your  throat.  You have a bad headache, ear pain, sinus pain, or chest pain.  You have a high-pitched whistling sound when you breathe in and out (wheezing).  You cough up blood.  You have sore muscles or a stiff neck. MAKE SURE YOU:   Understand these instructions.  Will watch your condition.  Will get help right away if you are not doing well or get worse. Document Released: 10/18/2007 Document Revised: 07/24/2011 Document Reviewed: 08/06/2013 [facility] Patient Information 2015 [place], [place]. This information is not intended to replace advice given to you by your health care provider. Make sure you discuss any questions you have with your health care provider.   We recommend the following healthy lifestyle for LIFE: 1) Small portions.   Tip: eat off of a salad plate instead of a dinner plate.  Tip: It is ok to feel hungry after a meal - that likely means you ate an appropriate portion.  Tip: if you need more or a snack choose fruits, veggies and/or a handful of nuts or seeds.  2) Eat a healthy clean diet.  * Tip: Avoid (less then 1 serving per week): processed foods, sweets, sweetened drinks, white starches (rice, flour, bread, potatoes, pasta, etc), red meat, fast foods, butter  *Tip: CHOOSE instead   * 5-9 servings per day of fresh or frozen fruits and vegetables (but not corn, potatoes, bananas, canned or dried fruit)   *nuts and seeds, beans   *olives and olive oil   *small portions of lean meats such as fish and white chicken    *small portions of whole grains  3)Get at least 150 minutes of sweaty aerobic exercise per week.  4)Reduce stress - consider counseling, meditation and relaxation to balance other aspects of your life.    Colin Benton R., DO

## 2016-03-28 ENCOUNTER — Telehealth: Payer: Self-pay | Admitting: Physician Assistant

## 2016-03-28 NOTE — Telephone Encounter (Signed)
Patient wanted to be certain she could use the 2nd part of the Moviprep. She is taking the first part today. If her bowel movements become too liquid or difficult to control she will stop with one half of the prep.

## 2016-04-03 ENCOUNTER — Ambulatory Visit: Payer: Medicare Other | Admitting: Family Medicine

## 2016-05-01 ENCOUNTER — Telehealth: Payer: Self-pay | Admitting: Family Medicine

## 2016-05-01 MED ORDER — LISINOPRIL 40 MG PO TABS: 40.0000 mg | ORAL_TABLET | Freq: Every day | ORAL | 2 refills | Status: DC

## 2016-05-01 MED ORDER — AMLODIPINE BESYLATE 5 MG PO TABS: 5.0000 mg | ORAL_TABLET | Freq: Every day | ORAL | 2 refills | Status: DC

## 2016-05-01 NOTE — Telephone Encounter (Signed)
Pt need new Rx for Amlodipine #90 and lisinopril #90    Pharm:  [facility]

## 2016-05-01 NOTE — Telephone Encounter (Signed)
Rx done. 

## 2016-05-15 DIAGNOSIS — H269 Unspecified cataract: Secondary | ICD-10-CM

## 2016-05-15 HISTORY — DX: Unspecified cataract: H26.9

## 2016-07-03 NOTE — Progress Notes (Signed)
HPI:  Robin [name] is a pleasant 83 y.o. here for follow up. Chronic medical problems summarized below were reviewed for changes and stability and were updated as needed below. These issues and their treatment remain stable for the most part. Due for endocrine follow up regarding her hypercalcemia in May. Had a number of family members pass in 2017 including sister in December. Grieving but feels doing ok and coping well. No depression or SI and does not feel she needs help with this. Reports BP continues to be great at home. Had CT scan with GI and concerned about cyst on ovary - she is upset that GI did not tell her about this. Reports she received the report and noticed this. Denies CP, SOB, DOE, abd pain, bloating, treatment intolerance or new symptoms. AWV 11/2015  HTN/Aortic atherosclerosis: -she keeps home log and BP runs better at home, we have checked her cuff  -lisinopril 40mg , norvasc 5mg  -endo had rxd lasix, she stopped it on her own and reports has not had any issues with swelling  HLD: -meds: crestor 2.5mg  daily, zetia 10mg  -hx statin intol  Hypercalcemia -seeing endocrinologist -thought 2ndary to vit D def per endo notes, pt was advised to stop Vit D supplement after last set labs with endo  GERD: -she stopped taking her PPI, used to take nexium 40, reports takes rarely when has symptoms -hx hiatal hernia and gastritis  ROS: See pertinent positives and negatives per HPI.  Past Medical History:  Diagnosis Date  . Colon polyps    tubulovillous adenoma, 2010  . External hemorrhoids without mention of complication   . GERD (gastroesophageal reflux disease)    hx PUD, Hiatal hernia  . Headache    eval with neuro in 2011  . Hyperlipemia   . Hyperparathyroidism [facility])    seeing Dr. Dwyane Dee in endocrinology  . Hypertension   . IBS (irritable bowel syndrome), GERD, hx colon polyps, chronic abd pain - followed by Dr. Sharlett Iles in GI 06/11/2012  . Osteoporosis 08/29/2011  .  Vertigo    eval with neuro in 2011, brief recurrence 2016    Past Surgical History:  Procedure Laterality Date  . BACK SURGERY  1974  . CARPAL TUNNEL RELEASE  2/10  . TONSILLECTOMY      Family History  Problem Relation Age of Onset  . Diabetes Brother   . Hypertension Brother   . Hypertension Father   . Stroke Father   . Diabetes Sister   . Hypertension Sister   . Heart disease Sister   . CAD Mother     Died of MI at age 5  . CAD Brother     Died of MI at age 50  . Colon cancer Neg Hx   . Hypercalcemia Neg Hx     Social History   Social History  . Marital status: Widowed    Spouse name: N/A  . Number of children: 2  . Years of education: N/A   Occupational History  . retired   .  Retired   Social History Main Topics  . Smoking status: Never Smoker  . Smokeless tobacco: Never Used  . Alcohol use No  . Drug use: No  . Sexual activity: Not Asked   Other Topics Concern  . None   Social History Narrative   Lives alone in a one story home.  Has 2 children.  Retired from working for a bank.  Education: high school.      Current Outpatient Prescriptions:  .  amLODipine (NORVASC) 5 MG tablet, Take 1 tablet (5 mg total) by mouth daily., Disp: 90 tablet, Rfl: 2 .  benzonatate (TESSALON) 100 MG capsule, Take 1 capsule (100 mg total) by mouth 2 (two) times daily as needed for cough., Disp: 20 capsule, Rfl: 0 .  Bisacodyl (DULCOLAX PO), Take 5 mg by mouth daily as needed (constipation). As needed, Disp: , Rfl:  .  ezetimibe (ZETIA) 10 MG tablet, TAKE 1 BY MOUTH DAILY, Disp: 90 tablet, Rfl: 3 .  hydrocortisone (ANUSOL-HC) 2.5 % rectal cream, Apply to the rectal area 2-3 times daily as needed., Disp: 30 g, Rfl: 1 .  lisinopril (PRINIVIL,ZESTRIL) 40 MG tablet, Take 1 tablet (40 mg total) by mouth daily., Disp: 90 tablet, Rfl: 2 .  omeprazole (PRILOSEC) 40 MG capsule, Take 1 capsule (40 mg total) by mouth daily., Disp: 90 capsule, Rfl: 3 .  rosuvastatin (CRESTOR) 5 MG  tablet, Take 1/2 tablet by mouth daily., Disp: 45 tablet, Rfl: 1  EXAM:  Vitals:   07/04/16 0932  BP: 139/80  Pulse: 90  Temp: 98.2 F (36.8 C)    Body mass index is 27.91 kg/m.  GENERAL: vitals reviewed and listed above, alert, oriented, appears well hydrated and in no acute distress  HEENT: atraumatic, conjunttiva clear, no obvious abnormalities on inspection of external nose and ears  NECK: no obvious masses on inspection  LUNGS: clear to auscultation bilaterally, no wheezes, rales or rhonchi, good air movement  CV: HRRR, no peripheral edema  MS: moves all extremities without noticeable abnormality  PSYCH: pleasant and cooperative, no obvious depression or anxiety  ASSESSMENT AND PLAN:  Discussed the following assessment and plan:  Essential hypertension - Plan: CBC, Basic metabolic panel  Aortic atherosclerosis (HCC)  HYPERCHOLESTEROLEMIA  Cyst of left ovary - US in 03/2017 ordered per radiology recs re-eval 1 year - Plan: US Pelvis Limited  -labs -discussed radiology report - discussed options for re-eval and she prefers [place] but not TV, order placed -lifestyle recs -Patient advised to return or notify a doctor immediately if symptoms worsen or persist or new concerns arise.  Patient Instructions  BEFORE YOU LEAVE: -labs -follow up: 3-4 months    Colin Benton R., DO

## 2016-07-04 ENCOUNTER — Encounter: Payer: Self-pay | Admitting: Family Medicine

## 2016-07-04 ENCOUNTER — Ambulatory Visit (INDEPENDENT_AMBULATORY_CARE_PROVIDER_SITE_OTHER): Payer: Medicare Other | Admitting: Family Medicine

## 2016-07-04 VITALS — BP 139/80 | HR 90 | Temp 98.2°F | Ht 61.0 in | Wt 147.7 lb

## 2016-07-04 DIAGNOSIS — N83202 Unspecified ovarian cyst, left side: Secondary | ICD-10-CM

## 2016-07-04 DIAGNOSIS — I700 Atherosclerosis of aorta: Secondary | ICD-10-CM | POA: Diagnosis not present

## 2016-07-04 DIAGNOSIS — E7800 Pure hypercholesterolemia, unspecified: Secondary | ICD-10-CM

## 2016-07-04 DIAGNOSIS — I10 Essential (primary) hypertension: Secondary | ICD-10-CM | POA: Diagnosis not present

## 2016-07-04 LAB — CBC
HEMATOCRIT: 42.3 % (ref 36.0–46.0)
HEMOGLOBIN: 14 g/dL (ref 12.0–15.0)
MCHC: 33.2 g/dL (ref 30.0–36.0)
MCV: 93.9 fl (ref 78.0–100.0)
Platelets: 275 10*3/uL (ref 150.0–400.0)
RBC: 4.5 Mil/uL (ref 3.87–5.11)
RDW: 13.2 % (ref 11.5–15.5)
WBC: 6.4 10*3/uL (ref 4.0–10.5)

## 2016-07-04 LAB — BASIC METABOLIC PANEL
BUN: 16 mg/dL (ref 6–23)
CO2: 28 mEq/L (ref 19–32)
CREATININE: 0.64 mg/dL (ref 0.40–1.20)
Calcium: 10.3 mg/dL (ref 8.4–10.5)
Chloride: 106 mEq/L (ref 96–112)
GFR: 94.77 mL/min (ref >60.00)
GLUCOSE: 84 mg/dL (ref 70–99)
Potassium: 4.1 mEq/L (ref 3.5–5.1)
Sodium: 139 mEq/L (ref 135–145)

## 2016-07-04 NOTE — Patient Instructions (Signed)
BEFORE YOU LEAVE: -labs -follow up: 3-4 months

## 2016-07-04 NOTE — Progress Notes (Signed)
Pre visit review using our clinic review tool, if applicable. No additional management support is needed unless otherwise documented below in the visit note. 

## 2016-07-06 IMAGING — MR MR BRAIN/IAC WO/W
10 of 11 series · 42 of 48 positions shown · IV contrast (Multihance 12ml)
Comparison: CT head without contrast 04/01/2015. MRI brain
09/08/2008.

CLINICAL DATA: Dizziness. Unsteady gait. Cerebral vascular disease.
Vertigo predominantly when standing.

Creatinine was obtained on site at [HOSPITAL] at [HOSPITAL].Results: Creatinine mg/dL.
EXAM:
MR BRAIN/IAC WITHOUT AND WITH CONTRAST
TECHNIQUE: Multiplanar, multisequence MR imaging was performed both before and
after administration of intravenous contrast.
CONTRAST:  12mL MULTIHANCE GADOBENATE DIMEGLUMINE 529 MG/ML IV SOLN

[Series 5: T1 · sagittal · 4.0mm · 0.72mm/px · 3 of 26 slices shown (1 of 3)]
[im 1/26]
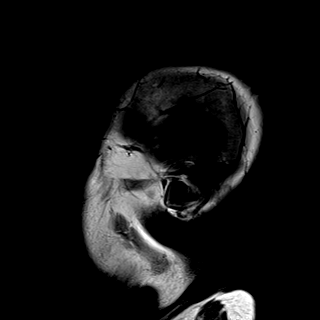
[im 13/26]
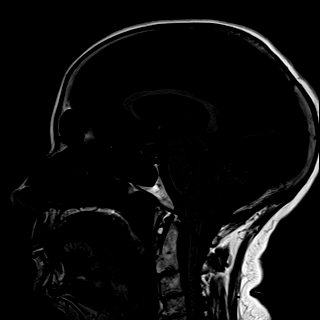
[im 26/26]
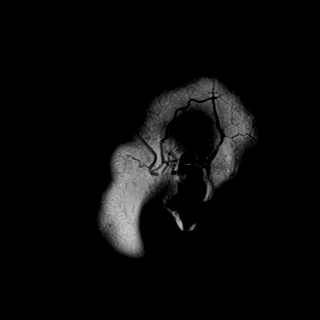

[Series 6: DWI · axial · 3.0mm · 1.44mm/px · z∈[-140,+22]mm · 9 of 86 slices shown (1 of 2)]
[im 1/86]
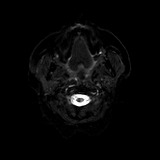
[im 11/86]
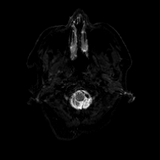
[im 22/86]
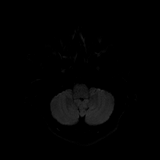
[im 32/86]
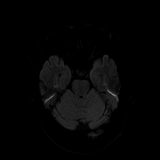
[im 43/86]
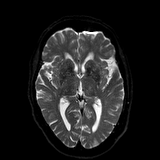
[im 54/86]
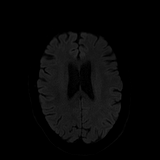
[im 64/86]
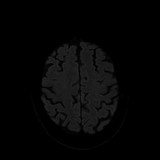
[im 75/86]
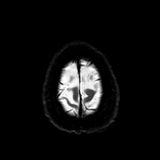
[im 86/86]
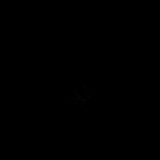

[Series 7: DWI · axial · 3.0mm · 1.44mm/px · z∈[-140,+22]mm · 4 of 43 slices shown (2 of 2)]
[im 1/43]
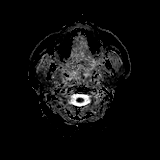
[im 15/43]
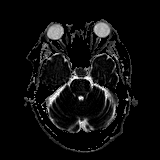
[im 29/43]
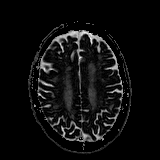
[im 43/43]
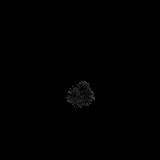

[Series 8: T2 · axial · 4.0mm · 0.36mm/px · z∈[-135,+14]mm · 3 of 30 slices shown]
[im 1/30]
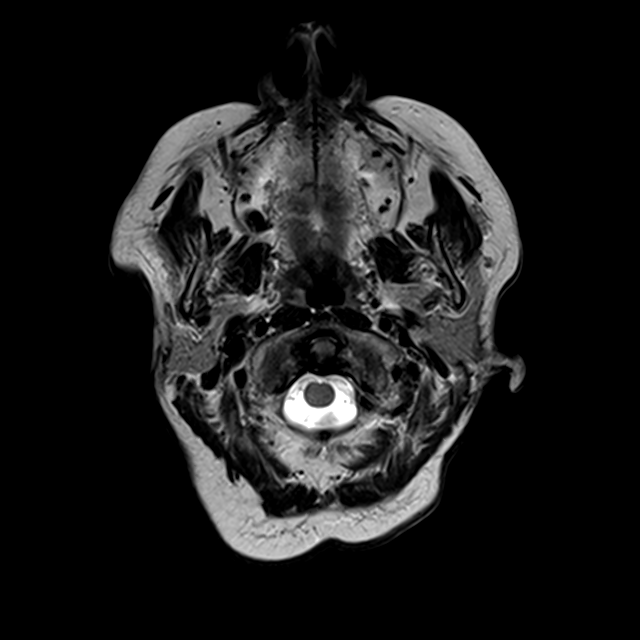
[im 15/30]
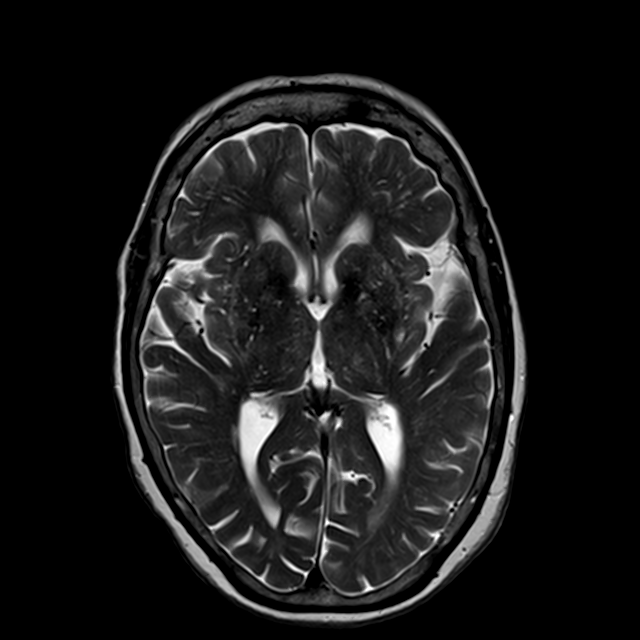
[im 30/30]
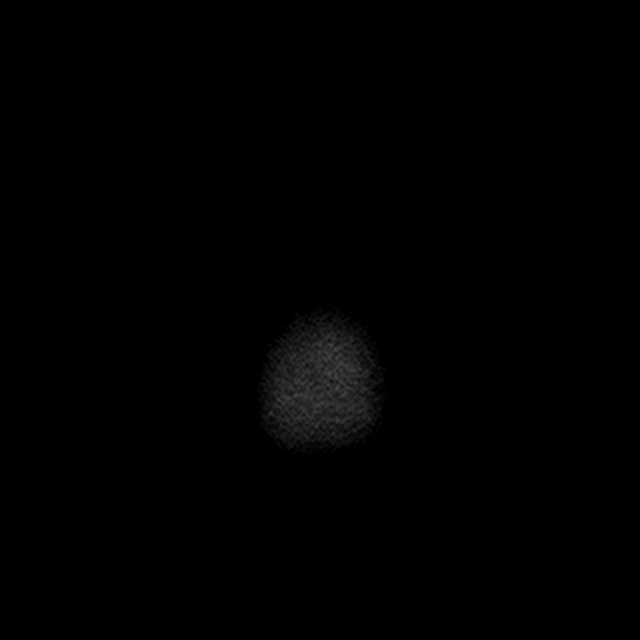

[Series 9: FLAIR · axial · 4.0mm · 0.72mm/px · z∈[-135,+14]mm · 3 of 30 slices shown]
[im 1/30]
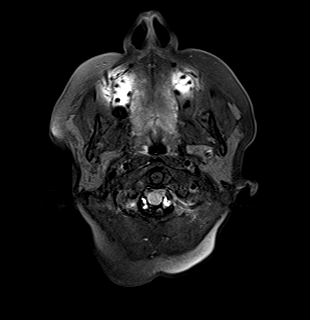
[im 15/30]
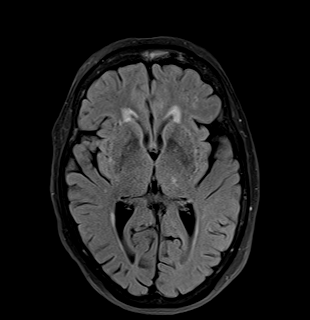
[im 30/30]
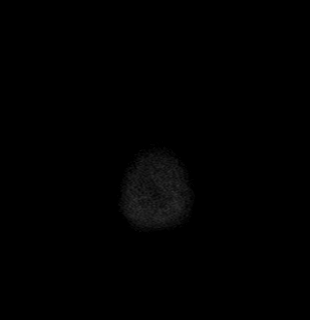

[Series 10: T1 · coronal · 2.5mm · 0.56mm/px · 1 of 11 slices shown (2 of 3)]
[im 1/11]
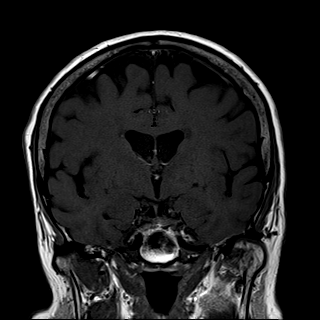

[Series 11: T1 · axial · 2.5mm · 0.59mm/px · 1 of 11 slices shown (3 of 3)]
[im 1/11]
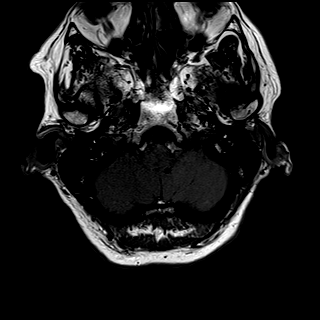

[Series 13: T1 post-contrast · coronal · 2.5mm · 0.56mm/px · 1 of 11 slices shown (1 of 3)]
[im 1/11]
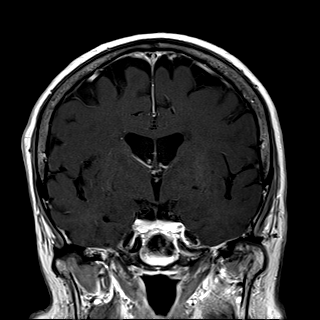

[Series 14: T1 post-contrast · axial · 2.5mm · 0.59mm/px · 1 of 11 slices shown (2 of 3)]
[im 1/11]
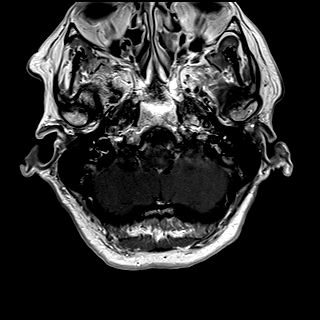

[Series 15: T1 post-contrast · axial · 1.0mm · 0.90mm/px · z∈[-137,+20]mm · 16 of 160 slices shown (3 of 3)]
[im 1/160]
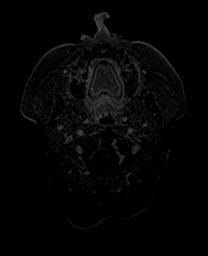
[im 11/160]
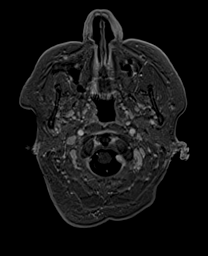
[im 22/160]
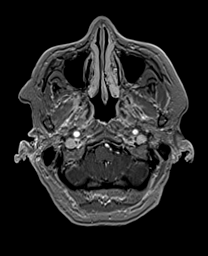
[im 32/160]
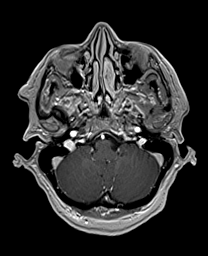
[im 43/160]
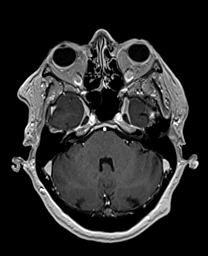
[im 54/160]
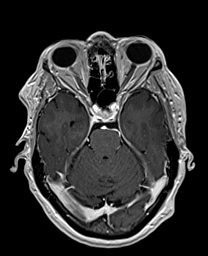
[im 64/160]
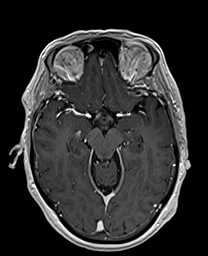
[im 75/160]
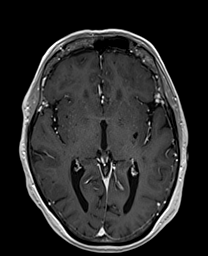
[im 85/160]
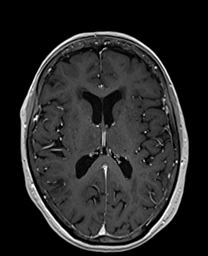
[im 96/160]
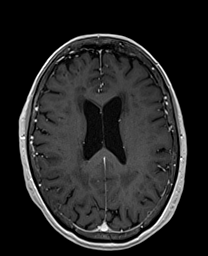
[im 107/160]
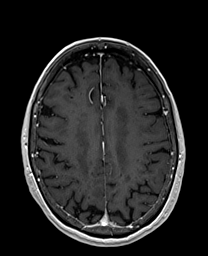
[im 117/160]
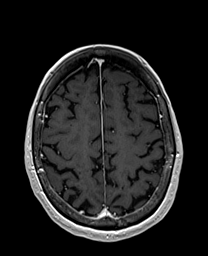
[im 128/160]
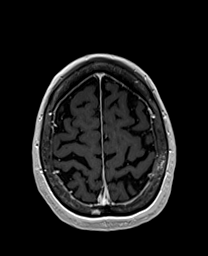
[im 138/160]
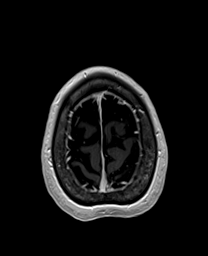
[im 149/160]
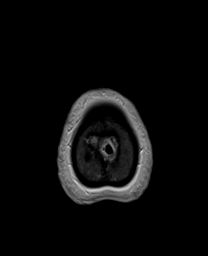
[im 160/160]
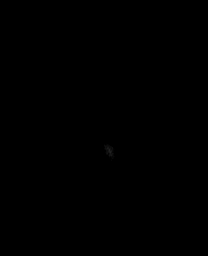

[42 of 48 positions shown; findings below may reference images not displayed]

FINDINGS: Advanced periventricular and subcortical T2 changes bilaterally have
progressed since the prior study. Progressive generalized atrophy is
also noted. White matter changes extend into the brainstem, more
severely than on the prior exam.

No acute infarct, hemorrhage, or mass lesion is present. The
ventricles are proportionate to the degree of atrophy. Insert pass
fluid

Progressive dilated perivascular spaces are present throughout the
basal ganglia.

Dedicated imaging of the internal auditory canals demonstrates no
pathologic enhancement.

High-resolution T2 weighted imaging demonstrates a distinct
appearance of the seventh and eighth cranial nerves. The inner ear
structures normally formed. The fluid containing space of the
cochlea and vestibular apparatus are within normal limits.

Postcontrast imaging through the remainder of the brain is within
normal limits.

Skullbase is within normal limits. Degenerative changes are noted in
the upper cervical spine with slight anterolisthesis at C2-3 and
C3-4. Midline sagittal images are otherwise unremarkable.
IMPRESSION: 1. Progressive moderate age advanced atrophy and white matter
disease.
2. No acute intracranial abnormality.
3. Dedicated imaging of the internal auditory canals is within
normal limits bilaterally.

## 2016-07-18 ENCOUNTER — Telehealth: Payer: Self-pay | Admitting: *Deleted

## 2016-07-18 ENCOUNTER — Other Ambulatory Visit: Payer: Self-pay | Admitting: General Surgery

## 2016-07-18 DIAGNOSIS — N83202 Unspecified ovarian cyst, left side: Secondary | ICD-10-CM

## 2016-07-18 NOTE — Telephone Encounter (Signed)
Per Magdalene Patricia called from [place] stating the ultrasound orders should be changed to: [id number] pelvic and [id number] trans vag.

## 2016-07-19 NOTE — Telephone Encounter (Signed)
Ok to change orders per their request - please ensure this is set up for future?

## 2016-07-20 NOTE — Telephone Encounter (Signed)
Orders re-entered

## 2016-07-28 ENCOUNTER — Encounter: Payer: Self-pay | Admitting: Family Medicine

## 2016-07-28 ENCOUNTER — Ambulatory Visit (INDEPENDENT_AMBULATORY_CARE_PROVIDER_SITE_OTHER): Payer: Medicare Other | Admitting: Family Medicine

## 2016-07-28 VITALS — BP 150/93 | HR 95 | Temp 98.4°F | Ht 61.0 in | Wt 148.0 lb

## 2016-07-28 DIAGNOSIS — K581 Irritable bowel syndrome with constipation: Secondary | ICD-10-CM

## 2016-07-28 NOTE — Progress Notes (Signed)
Pre visit review using our clinic review tool, if applicable. No additional management support is needed unless otherwise documented below in the visit note. 

## 2016-07-28 NOTE — Patient Instructions (Signed)
WE NOW OFFER   [facility] FAST TRACK!!!  SAME DAY Appointments for ACUTE CARE  Such as: Sprains, Injuries, cuts, abrasions, rashes, muscle pain, joint pain, back pain Colds, flu, sore throats, headache, allergies, cough, fever  Ear pain, sinus and eye infections Abdominal pain, nausea, vomiting, diarrhea, upset stomach Animal/insect bites  3 Easy Ways to Schedule: Walk-In Scheduling Call in scheduling Mychart Sign-up: [email or web address]         

## 2016-07-28 NOTE — Progress Notes (Signed)
   Subjective:    Patient ID: Robin [name], female    DOB: Sep 16, 1933, 83 y.o.   MRN: [id number]  HPI Here for help with constipation. This has been a chronic problem for her. She uses Miralax bid, Dulculax daily, and sometimes glycerin suppositories. Now for the past 4 days she has had no BMs. She feels bloated but not nauseated. No pain.    Review of Systems  Constitutional: Negative.   Respiratory: Negative.   Cardiovascular: Negative.   Gastrointestinal: Positive for abdominal distention. Negative for abdominal pain, anal bleeding, blood in stool, constipation, diarrhea, nausea, rectal pain and vomiting.  Genitourinary: Negative.   Neurological: Negative.        Objective:   Physical Exam  Constitutional: She appears well-developed and well-nourished. No distress.  Cardiovascular: Normal rate, regular rhythm, normal heart sounds and intact distal pulses.   Pulmonary/Chest: Effort normal and breath sounds normal.  Abdominal: Soft. Bowel sounds are normal. She exhibits no distension and no mass. There is no tenderness. There is no rebound and no guarding.          Assessment & Plan:  Constipation. iadvised her to drink plenty of water, which she typically does not do. I also suggested she try magnesium citrate OTC, to take one bottle when she gets home and then repeat with another bottle 2 hours later. Recheck prn.  Alysia Penna, MD

## 2016-07-28 NOTE — Telephone Encounter (Signed)
Robin [name] a pt would like for you to call her to ask you about a medication.

## 2016-07-28 NOTE — Telephone Encounter (Signed)
I called the Robin [name] and Robin [name] stated Robin [name] has been constipated, tried Miralax with no relief and has not had a BM for the past 4 days and wanted to know if Dr Maudie Mercury could call her in a medication.  I advised the Robin [name] Robin [name] would need to be seen in this case and I scheduled an appt for her to see Dr Sarajane Jews at 3:15pm.

## 2016-08-16 ENCOUNTER — Telehealth: Payer: Self-pay | Admitting: Gastroenterology

## 2016-08-16 ENCOUNTER — Ambulatory Visit (INDEPENDENT_AMBULATORY_CARE_PROVIDER_SITE_OTHER): Payer: Medicare Other | Admitting: Gastroenterology

## 2016-08-16 ENCOUNTER — Encounter: Payer: Self-pay | Admitting: Gastroenterology

## 2016-08-16 VITALS — BP 134/86 | HR 84 | Ht 61.0 in | Wt 148.4 lb

## 2016-08-16 DIAGNOSIS — K219 Gastro-esophageal reflux disease without esophagitis: Secondary | ICD-10-CM

## 2016-08-16 DIAGNOSIS — K5900 Constipation, unspecified: Secondary | ICD-10-CM

## 2016-08-16 MED ORDER — ESOMEPRAZOLE MAGNESIUM 40 MG PO CPDR: 40.0000 mg | DELAYED_RELEASE_CAPSULE | Freq: Every day | ORAL | 11 refills | Status: DC

## 2016-08-16 MED ORDER — PLECANATIDE 3 MG PO TABS: 1.0000 | ORAL_TABLET | Freq: Every day | ORAL | 0 refills | Status: DC

## 2016-08-16 NOTE — Progress Notes (Signed)
History of Present Illness: This is an 83 year old female with worsening chronic constipation and active reflux symptoms. She states she takes Nexium OTC as needed and has not been using it recently. After taking a bottle of magnesium citrate for a flare constipation she has had burning epigastric pain and burning substernal pain that is exacerbated by meals. She has not used her Nexium regularly despite these symptoms. She struggles with constipation and has used multiple laxatives including MiraLAX twice a day to go lax tablets daily as needed glycerin suppositories as needed and magnesium citrate as described above. She states she used to assess in the past but caused diarrhea so she discontinued it.    Allergies  Allergen Reactions  . Prilosec [Omeprazole] Cough  . Amoxicillin     REACTION: rash  . Metronidazole     REACTION: rash  . Protonix [Pantoprazole Sodium] Swelling   Outpatient Medications Prior to Visit  Medication Sig Dispense Refill  . amLODipine (NORVASC) 5 MG tablet Take 1 tablet (5 mg total) by mouth daily. 90 tablet 2  . Bisacodyl (DULCOLAX PO) Take 5 mg by mouth daily as needed (constipation). As needed    . ezetimibe (ZETIA) 10 MG tablet TAKE 1 BY MOUTH DAILY 90 tablet 3  . hydrocortisone (ANUSOL-HC) 2.5 % rectal cream Apply to the rectal area 2-3 times daily as needed. 30 g 1  . lisinopril (PRINIVIL,ZESTRIL) 40 MG tablet Take 1 tablet (40 mg total) by mouth daily. 90 tablet 2  . rosuvastatin (CRESTOR) 5 MG tablet Take 1/2 tablet by mouth daily. 45 tablet 1  . benzonatate (TESSALON) 100 MG capsule Take 1 capsule (100 mg total) by mouth 2 (two) times daily as needed for cough. (Patient not taking: Reported on 07/28/2016) 20 capsule 0  . omeprazole (PRILOSEC) 40 MG capsule Take 1 capsule (40 mg total) by mouth daily. 90 capsule 3   No facility-administered medications prior to visit.    Past Medical History:  Diagnosis Date  . Colon polyps    tubulovillous adenoma,  2010  . External hemorrhoids without mention of complication   . GERD (gastroesophageal reflux disease)    hx PUD, Hiatal hernia  . Headache    eval with neuro in 2011  . Hyperlipemia   . Hyperparathyroidism [facility])    seeing Dr. Dwyane Dee in endocrinology  . Hypertension   . IBS (irritable bowel syndrome), GERD, hx colon polyps, chronic abd pain - followed by Dr. Sharlett Iles in GI 06/11/2012  . Osteoporosis 08/29/2011  . Vertigo    eval with neuro in 2011, brief recurrence 2016   Past Surgical History:  Procedure Laterality Date  . BACK SURGERY  1974  . CARPAL TUNNEL RELEASE  2/10  . TONSILLECTOMY     Social History   Social History  . Marital status: Widowed    Spouse name: N/A  . Number of children: 2  . Years of education: N/A   Occupational History  . retired   .  Retired   Social History Main Topics  . Smoking status: Never Smoker  . Smokeless tobacco: Never Used  . Alcohol use No  . Drug use: No  . Sexual activity: Not Asked   Other Topics Concern  . None   Social History Narrative   Lives alone in a one story home.  Has 2 children.  Retired from working for a bank.  Education: high school.    Family History  Problem Relation Age of Onset  . Diabetes Brother   .  Hypertension Brother   . Hypertension Father   . Stroke Father   . Diabetes Sister   . Hypertension Sister   . Heart disease Sister   . CAD Mother     Died of MI at age 52  . CAD Brother     Died of MI at age 70  . Colon cancer Neg Hx   . Hypercalcemia Neg Hx      Physical Exam: General: Well developed, well nourished, no acute distress Head: Normocephalic and atraumatic Eyes:  sclerae anicteric, EOMI Ears: Normal auditory acuity Mouth: No deformity or lesions Lungs: Clear throughout to auscultation Heart: Regular rate and rhythm; no murmurs, rubs or bruits Abdomen: Soft, non tender and non distended. No masses, hepatosplenomegaly or hernias noted. Normal Bowel sounds Musculoskeletal:  Symmetrical with no gross deformities  Pulses:  Normal pulses noted Extremities: No clubbing, cyanosis, edema or deformities noted Neurological: Alert oriented x 4, grossly nonfocal Psychological:  Alert and cooperative. Normal mood and affect  Assessment and Recommendations:  1. GERD. Esomeprazole 40 mg po daily for 2 months, then prn if symptoms are controlled and closely follow all antireflux measures.   2. Constipation, probable IBS-C. Previously failed Linzess. Trulance 3 mg daily. Miralax bid prn.  patient is asked to call us report results with Trulance next week. REV in 2 months.

## 2016-08-16 NOTE — Patient Instructions (Signed)
Start Trulance samples one tablet by mouth daily x 2 weeks. If this works then we can send a prescription to your pharmacy.   We have sent the following medications to your pharmacy for you to pick up at your convenience: Nexium.   Patient advised to avoid spicy, acidic, citrus, chocolate, mints, fruit and fruit juices.  Limit the intake of caffeine, alcohol and Soda.  Don't exercise too soon after eating.  Don't lie down within 3-4 hours of eating.  Elevate the head of your bed.  Thank you for choosing me and [facility].  Pricilla Riffle. Dagoberto Ligas., MD., [facility] Regal

## 2016-08-16 NOTE — Telephone Encounter (Signed)
Patient's voicemail said she couldn't currently take calls and therefore I left her a message that we were calling her back and that we would call her back tomorrow to discuss her Trulance directions.

## 2016-08-17 NOTE — Telephone Encounter (Signed)
Patient wanted to verify that she can take Trulance in the morning daily. Informed patient she can take it in the morning with or without food. Patient verbalized understanding.

## 2016-08-29 ENCOUNTER — Encounter: Payer: Self-pay | Admitting: Gastroenterology

## 2016-09-04 ENCOUNTER — Telehealth: Payer: Self-pay | Admitting: Gastroenterology

## 2016-09-04 NOTE — Telephone Encounter (Signed)
She was to call and report that she tried the trulance samples.  For the first 2 days worked great, but then no results.  She is out of samples.  She is asking what the next step is.

## 2016-09-04 NOTE — Telephone Encounter (Signed)
Linzess 72 mcg daily on empty stomach 30 minutes before breakfast

## 2016-09-04 NOTE — Telephone Encounter (Signed)
Forwarding to Dr. Fuller Plan

## 2016-09-05 ENCOUNTER — Other Ambulatory Visit: Payer: Self-pay

## 2016-09-05 MED ORDER — LINACLOTIDE 72 MCG PO CAPS: 72.0000 ug | ORAL_CAPSULE | Freq: Every day | ORAL | 1 refills | Status: DC

## 2016-09-05 NOTE — Telephone Encounter (Signed)
Spoke to patient, she stated that she had tried Linzess when Dr. Sharlett Iles prescribed it for her and it gave her diarrhea. Looked up dosage and she was on 290 mcg daily. Discussed this with patient and she is agreeable to try the lower dose. I have asked her to let us know how the 72 mcg dosage works for her which is what Dr. Fuller Plan advises. Rx sent to her pharmacy.

## 2016-09-27 ENCOUNTER — Encounter: Payer: Self-pay | Admitting: Gynecology

## 2016-10-02 DIAGNOSIS — T1512XA Foreign body in conjunctival sac, left eye, initial encounter: Secondary | ICD-10-CM | POA: Diagnosis not present

## 2016-10-12 ENCOUNTER — Ambulatory Visit (INDEPENDENT_AMBULATORY_CARE_PROVIDER_SITE_OTHER): Payer: Medicare Other | Admitting: Family Medicine

## 2016-10-12 ENCOUNTER — Telehealth: Payer: Self-pay | Admitting: Family Medicine

## 2016-10-12 ENCOUNTER — Encounter: Payer: Self-pay | Admitting: Family Medicine

## 2016-10-12 VITALS — BP 154/76 | HR 90 | Temp 98.5°F | Ht 61.0 in | Wt 149.1 lb

## 2016-10-12 DIAGNOSIS — E213 Hyperparathyroidism, unspecified: Secondary | ICD-10-CM

## 2016-10-12 DIAGNOSIS — I10 Essential (primary) hypertension: Secondary | ICD-10-CM | POA: Diagnosis not present

## 2016-10-12 DIAGNOSIS — R42 Dizziness and giddiness: Secondary | ICD-10-CM | POA: Diagnosis not present

## 2016-10-12 LAB — BASIC METABOLIC PANEL
BUN: 13 mg/dL (ref 6–23)
CO2: 28 mEq/L (ref 19–32)
CREATININE: 0.66 mg/dL (ref 0.40–1.20)
Calcium: 10.3 mg/dL (ref 8.4–10.5)
Chloride: 105 mEq/L (ref 96–112)
GFR: 91.41 mL/min (ref >60.00)
GLUCOSE: 89 mg/dL (ref 70–99)
POTASSIUM: 4.2 meq/L (ref 3.5–5.1)
Sodium: 140 mEq/L (ref 135–145)

## 2016-10-12 LAB — CBC
HCT: 42.3 % (ref 36.0–46.0)
HEMOGLOBIN: 14.1 g/dL (ref 12.0–15.0)
MCHC: 33.2 g/dL (ref 30.0–36.0)
MCV: 93.4 fl (ref 78.0–100.0)
Platelets: 303 10*3/uL (ref 150.0–400.0)
RBC: 4.53 Mil/uL (ref 3.87–5.11)
RDW: 13 % (ref 11.5–15.5)
WBC: 5.9 10*3/uL (ref 4.0–10.5)

## 2016-10-12 MED ORDER — MECLIZINE HCL 12.5 MG PO TABS: 12.5000 mg | ORAL_TABLET | Freq: Three times a day (TID) | ORAL | 0 refills | Status: DC | PRN

## 2016-10-12 NOTE — Telephone Encounter (Signed)
Spoke with pt and she states that her BP has been running very high. She reports it at 178/89 this morning, but it has been even higher. She also c/o dizziness and some nausea. This has been happening for several days. Advised pt she should be seen in office for evaluation. She agrees. Appt made with Dr. Maudie Mercury for this morning.

## 2016-10-12 NOTE — Patient Instructions (Addendum)
BEFORE YOU LEAVE: -follow up: as scheduled in a few weeks -labs  Increase norvasc to 7.5 mg daily (1 .5 tablets)  Meclizine per instructions as needed for the dizziness.  Can try the vestibular treatments for the dizziness but do advise you call your neurologist if worsening or persistent.  I hope you are feeling better soon! Seek care immediately if worsening, new concerns or you are not improving with treatment.

## 2016-10-12 NOTE — Progress Notes (Signed)
HPI:  Acute visit for HTN and dizziness. Long hx labile BP and has had dizziness/vertigo episodes (with prior Dx BPPV) with extensive eval in the past. Reports brief spells dizziness for 1.5 weeks. BP up at home the last few days. Some nausea with the dizziness today. No CP, SOB, DOE, falls, weakness, numbness. She reports the BP is up because she was worried about the dizziness because it felt different then her prior dizziness in that it comes in waves. It looks like she actually reported a "different" kind of dizziness last year when saw neurology so had an MRI then. Dizziness has resolved with vestibular rehab in the past. She has issues with tolerability with BP meds.  ROS: See pertinent positives and negatives per HPI.  Past Medical History:  Diagnosis Date  . Colon polyps    tubulovillous adenoma, 2010  . External hemorrhoids without mention of complication   . GERD (gastroesophageal reflux disease)    hx PUD, Hiatal hernia  . Headache    eval with neuro in 2011  . Hyperlipemia   . Hyperparathyroidism [facility])    seeing Dr. Dwyane Dee in endocrinology  . Hypertension   . IBS (irritable bowel syndrome), GERD, hx colon polyps, chronic abd pain - followed by Dr. Sharlett Iles in GI 06/11/2012  . Osteoporosis 08/29/2011  . Vertigo    eval with neuro in 2011, brief recurrence 2016    Past Surgical History:  Procedure Laterality Date  . BACK SURGERY  1974  . CARPAL TUNNEL RELEASE  2/10  . TONSILLECTOMY      Family History  Problem Relation Age of Onset  . Diabetes Brother   . Hypertension Brother   . Hypertension Father   . Stroke Father   . Diabetes Sister   . Hypertension Sister   . Heart disease Sister   . CAD Mother        Died of MI at age 70  . CAD Brother        Died of MI at age 38  . Colon cancer Neg Hx   . Hypercalcemia Neg Hx     Social History   Social History  . Marital status: Widowed    Spouse name: N/A  . Number of children: 2  . Years of education: N/A    Occupational History  . retired   .  Retired   Social History Main Topics  . Smoking status: Never Smoker  . Smokeless tobacco: Never Used  . Alcohol use No  . Drug use: No  . Sexual activity: Not Asked   Other Topics Concern  . None   Social History Narrative   Lives alone in a one story home.  Has 2 children.  Retired from working for a bank.  Education: high school.      Current Outpatient Prescriptions:  .  amLODipine (NORVASC) 5 MG tablet, Take 1 tablet (5 mg total) by mouth daily., Disp: 90 tablet, Rfl: 2 .  Bisacodyl (DULCOLAX PO), Take 5 mg by mouth daily as needed (constipation). As needed, Disp: , Rfl:  .  esomeprazole (NEXIUM) 40 MG capsule, Take 1 capsule (40 mg total) by mouth daily at 12 noon., Disp: 30 capsule, Rfl: 11 .  ezetimibe (ZETIA) 10 MG tablet, TAKE 1 BY MOUTH DAILY, Disp: 90 tablet, Rfl: 3 .  hydrocortisone (ANUSOL-HC) 2.5 % rectal cream, Apply to the rectal area 2-3 times daily as needed., Disp: 30 g, Rfl: 1 .  linaclotide (LINZESS) 72 MCG capsule, Take 1 capsule (  72 mcg total) by mouth daily before breakfast., Disp: 30 capsule, Rfl: 1 .  lisinopril (PRINIVIL,ZESTRIL) 40 MG tablet, Take 1 tablet (40 mg total) by mouth daily., Disp: 90 tablet, Rfl: 2 .  Plecanatide (TRULANCE) 3 MG TABS, Take 1 tablet by mouth daily., Disp: 14 tablet, Rfl: 0 .  rosuvastatin (CRESTOR) 5 MG tablet, Take 1/2 tablet by mouth daily., Disp: 45 tablet, Rfl: 1 .  meclizine (ANTIVERT) 12.5 MG tablet, Take 1 tablet (12.5 mg total) by mouth 3 (three) times daily as needed for dizziness., Disp: 30 tablet, Rfl: 0  EXAM:  Vitals:   10/12/16 0952 10/12/16 0957  BP: (!) 152/74 (!) 154/76  Pulse: 90   Temp: 98.5 F (36.9 C)     Body mass index is 28.17 kg/m.  GENERAL: vitals reviewed and listed above, alert, oriented, appears well hydrated and in no acute distress  HEENT: atraumatic, conjunttiva clear, no obvious abnormalities on inspection of external nose and ears  NECK:  no obvious masses on inspection  LUNGS: clear to auscultation bilaterally, no wheezes, rales or rhonchi, good air movement  CV: HRRR, no peripheral edema  MS: moves all extremities without noticeable abnormality  PSYCH: pleasant and cooperative, no obvious depression or anxiety  ASSESSMENT AND PLAN:  Discussed the following assessment and plan:  Essential hypertension - Plan: Basic metabolic panel, CBC  Vertigo - Plan: Basic metabolic panel, CBC, Ambulatory referral to Physical Therapy  Hyperparathyroidism ([place])  -we discussed possible serious and likely etiologies, workup and treatment, treatment risks and return precautions for the dizziness, she is very anxious about it which may be contributing to symptoms, discussed re-imaging -after this discussion, Diera opted for labs, mild increase in norvasc (watching for swelling), meclizine, vest therapy -follow up advised with [place] to recheck BP in a few weeks - advised neurology follow up if dizziness persists -of course, we advised [place]  to return or notify a doctor immediately if symptoms worsen or persist or new concerns arise.  .  -Patient advised to return or notify a doctor immediately if symptoms worsen or persist or new concerns arise.  Patient Instructions  BEFORE YOU LEAVE: -follow up: as scheduled in a few weeks -labs  Increase norvasc to 7.5 mg daily (1 .5 tablets)  Meclizine per instructions as needed for the dizziness.  Can try the vestibular treatments for the dizziness but do advise you call your neurologist if worsening or persistent.  I hope you are feeling better soon! Seek care immediately if worsening, new concerns or you are not improving with treatment.      Colin Benton R., DO

## 2016-10-12 NOTE — Telephone Encounter (Signed)
Pt calling stating her Bp has been rising transferred to triage.

## 2016-10-18 ENCOUNTER — Ambulatory Visit: Payer: Medicare Other | Attending: Family Medicine | Admitting: Rehabilitative and Restorative Service Providers"

## 2016-10-18 ENCOUNTER — Ambulatory Visit: Payer: Medicare Other | Admitting: Physical Therapy

## 2016-10-18 ENCOUNTER — Telehealth: Payer: Self-pay | Admitting: Family Medicine

## 2016-10-18 DIAGNOSIS — R42 Dizziness and giddiness: Secondary | ICD-10-CM

## 2016-10-18 DIAGNOSIS — R2689 Other abnormalities of gait and mobility: Secondary | ICD-10-CM | POA: Diagnosis not present

## 2016-10-18 DIAGNOSIS — R2681 Unsteadiness on feet: Secondary | ICD-10-CM | POA: Diagnosis not present

## 2016-10-18 NOTE — Patient Instructions (Signed)
Gaze Stabilization - Tip Card  1.Target must remain in focus, not blurry, and appear stationary while head is in motion. 2.Perform exercises with small head movements (45 to either side of midline). 3.Increase speed of head motion so long as target is in focus. 4.If you wear eyeglasses, be sure you can see target through lens (therapist will give specific instructions for bifocal / progressive lenses). 5.These exercises may provoke dizziness or nausea. Work through these symptoms. If too dizzy, slow head movement slightly. Rest between each exercise. 6.Exercises demand concentration; avoid distractions. 7.For safety, perform standing exercises close to a counter, wall, corner, or next to someone.  Copyright  VHI. All rights reserved.   Gaze Stabilization - Standing Feet Apart   Feet shoulder width apart, keeping eyes on target on wall 3 feet away, tilt head down slightly and move head side to side for 30 seconds. Repeat while moving head up and down for 30 seconds. *Work up to tolerating 60 seconds, as able. Do 2-3 sessions per day.   Copyright  VHI. All rights reserved.    

## 2016-10-18 NOTE — Telephone Encounter (Signed)
Robin [name] pt calling stating that she went to the therapist and they do not do that kind of therapy.  Pt would like to speak with you.

## 2016-10-19 NOTE — Therapy (Signed)
[facility] [address] [place], [place], [address] Phone: [phone]   Fax:  [phone]  Physical Therapy Evaluation  Patient Details  Name: Robin [name] MRN: [id number] Date of Birth: August 08, 1933 Referring Provider: Colin Benton, DO  Encounter Date: 10/18/2016      PT End of Session - 10/18/16 1938    Visit Number 1   Number of Visits 12   Date for PT Re-Evaluation 12/18/16   Authorization Type G code due every 10th visit   PT Start Time 1320   PT Stop Time 1410   PT Time Calculation (min) 50 min   Activity Tolerance Patient tolerated treatment well   Behavior During Therapy [facility] for tasks assessed/performed      Past Medical History:  Diagnosis Date  . Colon polyps    tubulovillous adenoma, 2010  . External hemorrhoids without mention of complication   . GERD (gastroesophageal reflux disease)    hx PUD, Hiatal hernia  . Headache    eval with neuro in 2011  . Hyperlipemia   . Hyperparathyroidism [facility])    seeing Dr. Dwyane Dee in endocrinology  . Hypertension   . IBS (irritable bowel syndrome), GERD, hx colon polyps, chronic abd pain - followed by Dr. Sharlett Iles in GI 06/11/2012  . Osteoporosis 08/29/2011  . Vertigo    eval with neuro in 2011, brief recurrence 2016    Past Surgical History:  Procedure Laterality Date  . BACK SURGERY  1974  . CARPAL TUNNEL RELEASE  2/10  . TONSILLECTOMY      There were no vitals filed for this visit.       Subjective Assessment - 10/18/16 1329    Subjective The patient is known to our clinic from prior tx for vertigo (2017).  She notes symptoms had improved and then worsened over the last 3 weeks.  She reorts she has a little bit of dizziness off and on all of the time.  She is now feeling a sensation that "about to faint" feeling is "washing over her" and this impacts her vision.  She notes that symptoms last for seconds.  The spells can come on intermittently without  head motion. She has not checked BP during spell.  She also reports a bandof tightness in her head.     Pertinent History HTN, IBS, hyperparathyroidism   Patient Stated Goals "get rid of this dizziness".  "I can't function like this."  She notes difficulty with housework and fear of falling.   Currently in Pain? No/denies            [facility] PT Assessment - 10/18/16 1334      Assessment   Medical Diagnosis vertigo   Referring Provider Colin Benton, DO   Onset Date/Surgical Date --  worsened over past 3 weeks   Prior Therapy known to our clinic from therapy in 2017     Precautions   Precautions Fall     Restrictions   Weight Bearing Restrictions No     Balance Screen   Has the patient fallen in the past 6 months No   Has the patient had a decrease in activity level because of a fear of falling?  Yes   Is the patient reluctant to leave their home because of a fear of falling?  Yes     [place] residence   Living Arrangements Alone   Type of [place] to enter  Entrance Stairs-Number of Steps 2   Entrance Stairs-Rails None   Home Layout One level   Home Equipment None     Prior Function   Level of Independence Independent     Ambulation/Gait   Ambulation/Gait Yes   Ambulation/Gait Assistance 6: Modified independent (Device/Increase time)  holding walls for support   Ambulation Surface Level   Gait velocity 1.72 ft/sec     Standardized Balance Assessment   Standardized Balance Assessment Berg Balance Test  performed components of the [employer] Balance Test   Sit to Stand Able to stand without using hands and stabilize independently   Standing Unsupported Able to stand safely 2 minutes   Sitting with Back Unsupported but Feet Supported on Floor or Stool Able to sit safely and securely 2 minutes   Stand to Sit Sits safely with minimal use of hands   Transfers Able to transfer safely, minor use of hands    Standing Unsupported with Eyes Closed Able to stand 10 seconds with supervision   Standing Ubsupported with Feet Together Able to place feet together independently and stand 1 minute safely   Standing Unsupported, One Foot in Front Able to take small step independently and hold 30 seconds   Standing on One Leg Tries to lift leg/unable to hold 3 seconds but remains standing independently            Vestibular Assessment - 10/18/16 1335      Vestibular Assessment   General Observation The patient walks into clinic without device independently--she reaches for walls to stabilize herself upon standing.  Patient denies hearing changes (notes her son thinks her TV is louder), denies nausea, denies tinnitus.  + for band of tightness L side of head radiating outward      Symptom Behavior   Type of Dizziness "Funny feeling in head"  "like I could faint"   Frequency of Dizziness several times per day   Duration of Dizziness <1 minutes   Aggravating Factors Spontaneous onset   Relieving Factors No known relieving factors     Occulomotor Exam   Occulomotor Alignment Abnormal  R eye hypertropia   Spontaneous Absent   Gaze-induced Absent   Smooth Pursuits Saccades  30 deg L with vertical tracking note saccades   Saccades Dysmetria;Slow  takes 2-3 movements to obtain target   Comment Test of skew=WNLs     Vestibulo-Occular Reflex   VOR 1 Head Only (x 1 viewing) slow gaze at self selected pace- able to maintain target in small ROM due to neck stiffness   Comment Neck AROM=approximately 40 deg bilateral AROM rotation  HEAD IMPULSE TEST=positive head impulse test bilaterally for refixation saccade (R HiT has larger amplitude corrective saccade), with subjective reports of whooziness and visual blurring.     Positional Testing   Sidelying Test Sidelying Right;Sidelying Left   Horizontal Canal Testing Horizontal Canal Right;Horizontal Canal Left     Sidelying Right   Sidelying Right Duration  Subjectively notes a "funny feeling in her head" that worsens as she stays in position. Notes "whoozy" sensation with return to sitting.   Sidelying Right Symptoms No nystagmus     Sidelying Left   Sidelying Left Duration provokes mild to moderate symptoms --no nystagmus viewed in room light.  PT used frenzels and notes a mild/trace nystagmus L beating in this position    Sidelying Left Symptoms No nystagmus     Horizontal Canal Right   Horizontal Canal Right Duration mild dizziness  noted   Horizontal Canal Right Symptoms Normal  no vertigo in room light or with frenzels donned     Horizontal Canal Left   Horizontal Canal Left Duration 5/10 dizziness   Horizontal Canal Left Symptoms Normal  no nystagmus viewed in room light or with frenzels         Objective measurements completed on examination: See above findings.           Vestibular Treatment/Exercise - 10/19/16 0001      Vestibular Treatment/Exercise   Vestibular Treatment Provided Gaze   Gaze Exercises X1 Viewing Horizontal     X1 Viewing Horizontal   Foot Position standing feet apart near support surface   Comments x 30 seconds with cues on technique-provided written handout.               PT Education - 10/18/16 1407    Education provided Yes   Education Details HEP: VOR x 1 viewing   Person(s) Educated Patient   Methods Demonstration;Explanation;Handout   Comprehension Verbalized understanding;Returned demonstration          PT Short Term Goals - 10/19/16 1938      PT SHORT TERM GOAL #1   Title The patient will be indep with HEP for gaze adaptation, saccades, habituation (rolling and sit<>sidelying), balance. TARGET DATE FOR ALL STGS:  11/17/16   Time 4   Period Weeks     PT SHORT TERM GOAL #2   Title The patient will improve gait speed from 1.72 ft/sec to > or equal to 2.0 ft/sec to demo dec'ing risk for falls.   Time 4   Period Weeks     PT SHORT TERM GOAL #3   Title The patient will  improve Berg balance score 5 points from baseline (need to complete).   Time 4   Period Weeks     PT SHORT TERM GOAL #4   Title The patient will improve neck AROM from 40 deg rotation bilaterally to 50 degrees to demo improved head motion needed for vestibular adaptation.   Time 4   Period Weeks     PT SHORT TERM GOAL #5   Title The patient will tolerate rolling R<>L with dizziness < or equal to 2/10 (improved from 5/10 at eval).   Time 4   Period Weeks           PT Long Term Goals - 10/19/16 1940      PT LONG TERM GOAL #1   Title The patient will be indep with progression of HEP for post d/c.  TARGET DATE FOR ALL LTGS:  12/18/16   Time 8   Period Weeks     PT LONG TERM GOAL #2   Title The patient will tolerate sit<>bilateral sidelying and rolling without reports of dizzines.   Time 8   Period Weeks     PT LONG TERM GOAL #3   Title The patient will improve gait speed from 1.72 ft/sec to > or equal to 2.5 ft/sec to demo improving functional mobility.   Time 8   Period Weeks     PT LONG TERM GOAL #4   Title The patient will tolerate gaze x 1 viewing x 60 seconds without c/o visual blurring or dizziness to demo improving vestibular adaptation.   Time 8   Period Weeks     PT LONG TERM GOAL #5   Title LTG for Berg to be established, if indicated.   Time 8   Period Weeks  Plan - 11/08/16 1949    Clinical Impression Statement The patient is an 83 year old female presenting to OP PT with recurrence of vertigo.  She presents with 2 oculomotor signs that may indicate some central involvement including R eye hypertropia (can indicate chronic peripheral otolith defect or central pathology), abnormal saccades.  she also has diminished VOR presenting with positive bilateral head impulse test, as well as motion sensitivity with positional movements noted.  Patient's gait/balance are impacted by clinical presentation and she notes decline in functional mobility.      History and Personal Factors relevant to plan of care: Lives alone, declining mobility, dec'd community mobility   Clinical Presentation Evolving   Clinical Presentation due to: abnormal oculomotor findings, dec'ing mobility, diminished VOR   Clinical Decision Making Moderate   Rehab Potential Good   Clinical Impairments Affecting Rehab Potential Responded well to prior PT, multi-factorial vertigo   PT Frequency 2x / week   PT Duration 8 weeks  decrease to 1x/week after 2-4 weeks as HEP established   PT Treatment/Interventions ADLs/Self Care Home Management;Canalith Repostioning;Vestibular;Therapeutic activities;Therapeutic exercise;Gait training;Functional mobility training;Patient/family education;Neuromuscular re-education;Balance training;Manual techniques   PT Next Visit Plan Check gaze HEP; add other HEP including saccades, FINISH BERG SCALE (write goal), gait training, balance training   Consulted and Agree with Plan of Care Patient      Patient will benefit from skilled therapeutic intervention in order to improve the following deficits and impairments:  Abnormal gait, Decreased balance, Dizziness, Difficulty walking, Decreased mobility, Postural dysfunction, Decreased range of motion, Decreased activity tolerance  Visit Diagnosis: Other abnormalities of gait and mobility  Unsteadiness on feet  Dizziness and giddiness      G-Codes - 2016/11/08 1956    Functional Assessment Tool Used (Outpatient Only) Dizziness with all bed mobility, 1.72 ft/sec, impaired balance.   Functional Limitation Mobility: Walking and moving around   Mobility: Walking and Moving Around Current Status [phone]) At least 20 percent but less than 40 percent impaired, limited or restricted   Mobility: Walking and Moving Around Goal Status [phone]) At least 1 percent but less than 20 percent impaired, limited or restricted       Problem List Patient Active Problem List   Diagnosis Date Noted  . Cyst of  left ovary 07/04/2016  . Hyperparathyroidism ([place]) 08/24/2014  . Slow transit constipation 01/13/2014  . IBS (irritable bowel syndrome), GERD, hx colon polyps, chronic abd pain - followed by Dr. Sharlett Iles in GI 06/11/2012  . Osteoporosis 08/29/2011  . Vitamin D deficiency 08/29/2011  . GERD (gastroesophageal reflux disease) 11/23/2010  . HYPERCHOLESTEROLEMIA 09/09/2007  . Essential hypertension 09/09/2007    [place], PT 2016-11-08, 7:57 PM  [facility] [address] [place], [place], [address] Phone: [phone]   Fax:  [phone]  Name: Robin [name] MRN: [id number] Date of Birth: [birth year]

## 2016-10-19 NOTE — Telephone Encounter (Signed)
I left a message for the pt to return my call. 

## 2016-10-19 NOTE — Telephone Encounter (Signed)
Pt is returning Robin [name]

## 2016-10-23 ENCOUNTER — Ambulatory Visit: Payer: Medicare Other | Admitting: Physical Therapy

## 2016-10-23 ENCOUNTER — Telehealth: Payer: Self-pay | Admitting: Family Medicine

## 2016-10-23 VITALS — BP 182/88 | HR 69

## 2016-10-23 DIAGNOSIS — R42 Dizziness and giddiness: Secondary | ICD-10-CM

## 2016-10-23 DIAGNOSIS — R2681 Unsteadiness on feet: Secondary | ICD-10-CM

## 2016-10-23 DIAGNOSIS — R2689 Other abnormalities of gait and mobility: Secondary | ICD-10-CM

## 2016-10-23 NOTE — Telephone Encounter (Signed)
Patient is calling in stating she wants to get a referral for Neurology.  Patient previously saw Dr. Tomi Likens.

## 2016-10-24 NOTE — Telephone Encounter (Signed)
See prior note (message sent to Dr Maudie Mercury stating PT did work).

## 2016-10-24 NOTE — Therapy (Signed)
[facility] [address] [place], [place], [address] Phone: [phone]   Fax:  [phone]  Physical Therapy Treatment  Patient Details  Name: Robin [name] MRN: [id number] Date of Birth: [birth year] Referring Provider: Colin Benton, DO  Encounter Date: 10/23/2016      PT End of Session - 10/24/16 1715    Visit Number 2   Number of Visits 12   Date for PT Re-Evaluation 12/18/16   Authorization Type G code due every 10th visit   PT Start Time 1150   PT Stop Time 1236   PT Time Calculation (min) 46 min      Past Medical History:  Diagnosis Date  . Colon polyps    tubulovillous adenoma, 2010  . External hemorrhoids without mention of complication   . GERD (gastroesophageal reflux disease)    hx PUD, Hiatal hernia  . Headache    eval with neuro in 2011  . Hyperlipemia   . Hyperparathyroidism [facility])    seeing Dr. Dwyane Dee in endocrinology  . Hypertension   . IBS (irritable bowel syndrome), GERD, hx colon polyps, chronic abd pain - followed by Dr. Sharlett Iles in GI 06/11/2012  . Osteoporosis 08/29/2011  . Vertigo    eval with neuro in 2011, brief recurrence 2016    Past Surgical History:  Procedure Laterality Date  . BACK SURGERY  1974  . CARPAL TUNNEL RELEASE  2/10  . TONSILLECTOMY      Vitals:   10/23/16 1216  BP: (!) 182/88  Pulse: 69        Subjective Assessment - 10/24/16 1711    Subjective Pt states she has constant feeling of lightheadedness with a "wave that is washing over her" as if she is about to faint.  Pt states no positional changes affect this feeling as it is constant in occurrence.  Pt continues to c/o band of tightness in her head.  States she did the eye exercise but it does not provoke any symtpoms and does not seem to be helping.   Pertinent History HTN, IBS, hyperparathyroidism   Patient Stated Goals "get rid of this dizziness".  "I can't function like this."  She notes difficulty with  housework and fear of falling.   Currently in Pain? No/denies      Neuro Re-ed: Reviewed x1 viewing exercise  - pt reports this exercise provokes no symptoms  Saccades - horizontal and vertical with no c/o increased dizziness     [facility] PT Assessment - 10/24/16 0001      Berg Balance Test   Sit to Stand Able to stand without using hands and stabilize independently   Standing Unsupported Able to stand safely 2 minutes   Sitting with Back Unsupported but Feet Supported on Floor or Stool Able to sit safely and securely 2 minutes   Stand to Sit Sits safely with minimal use of hands   Transfers Able to transfer safely, minor use of hands   Standing Unsupported with Eyes Closed Able to stand 10 seconds with supervision   Standing Ubsupported with Feet Together Able to place feet together independently and stand 1 minute safely   From Standing, Reach Forward with Outstretched Arm Can reach forward >12 cm safely (5")   From Standing Position, Pick up Object from Floor Unable to pick up and needs supervision   From Standing Position, Turn to Look Behind Over each Shoulder Turn sideways only but maintains balance   Turn 360 Degrees Needs close supervision or verbal  cueing   Standing Unsupported, Alternately Place Feet on Step/Stool Able to complete >2 steps/needs minimal assist   Standing Unsupported, One Foot in Front Able to take small step independently and hold 30 seconds   Standing on One Leg Tries to lift leg/unable to hold 3 seconds but remains standing independently   Total Score 38       Pt performed sit to stand with pivot turn 90 degrees - pt unsteady with this activity due to incr. Dizziness with turning    Discussed symptoms - high BP at today's visit could be contributing to dizziness; no change in symptoms reported with any Change in positions except with sit to stand with pivot turn - turning increased dizziness per pt report                  PT Education -  10/24/16 1714    Education provided Yes   Education Details Recommend pt to follow up with PCP, Dr. Maudie Mercury and possibly obtain MRI as symptoms do not appear to be of vestibular etiology.  Symptoms could also be partially due to HTN   Person(s) Educated Patient   Methods Explanation   Comprehension Verbalized understanding          PT Short Term Goals - 10/19/16 1938      PT SHORT TERM GOAL #1   Title The patient will be indep with HEP for gaze adaptation, saccades, habituation (rolling and sit<>sidelying), balance. TARGET DATE FOR ALL STGS:  11/17/16   Time 4   Period Weeks     PT SHORT TERM GOAL #2   Title The patient will improve gait speed from 1.72 ft/sec to > or equal to 2.0 ft/sec to demo dec'ing risk for falls.   Time 4   Period Weeks     PT SHORT TERM GOAL #3   Title The patient will improve Berg balance score 5 points from baseline (need to complete).   Time 4   Period Weeks     PT SHORT TERM GOAL #4   Title The patient will improve neck AROM from 40 deg rotation bilaterally to 50 degrees to demo improved head motion needed for vestibular adaptation.   Time 4   Period Weeks     PT SHORT TERM GOAL #5   Title The patient will tolerate rolling R<>L with dizziness < or equal to 2/10 (improved from 5/10 at eval).   Time 4   Period Weeks           PT Long Term Goals - 10/19/16 1940      PT LONG TERM GOAL #1   Title The patient will be indep with progression of HEP for post d/c.  TARGET DATE FOR ALL LTGS:  12/18/16   Time 8   Period Weeks     PT LONG TERM GOAL #2   Title The patient will tolerate sit<>bilateral sidelying and rolling without reports of dizzines.   Time 8   Period Weeks     PT LONG TERM GOAL #3   Title The patient will improve gait speed from 1.72 ft/sec to > or equal to 2.5 ft/sec to demo improving functional mobility.   Time 8   Period Weeks     PT LONG TERM GOAL #4   Title The patient will tolerate gaze x 1 viewing x 60 seconds without c/o  visual blurring or dizziness to demo improving vestibular adaptation.   Time 8   Period Weeks  PT LONG TERM GOAL #5   Title LTG for Berg to be established, if indicated.   Time 8   Period Weeks               Plan - 10/24/16 1716    Clinical Impression Statement Pt symptoms remain constant with all oculomotor testing, saccades, and positional testing.  Pt did have LOB with turning as performed during Berg test completion and with stand pivot turn exercise for balance exercise.  Pt requests to work on resolving the dizziness - symptoms appear to be due to HTN or possibly central etiology;  recommend pt to follow up with Dr. Maudie Mercury as vestibular PT does not seem to be helping - per her note she may need neurology referral for another MRI for further diagnostic work up.                                                                                                                 PT Frequency 2x / week   PT Duration 8 weeks   PT Treatment/Interventions ADLs/Self Care Home Management;Canalith Repostioning;Vestibular;Therapeutic activities;Therapeutic exercise;Gait training;Functional mobility training;Patient/family education;Neuromuscular re-education;Balance training;Manual techniques   PT Next Visit Plan reassess vertigo   Consulted and Agree with Plan of Care Patient      Patient will benefit from skilled therapeutic intervention in order to improve the following deficits and impairments:  Abnormal gait, Decreased balance, Dizziness, Difficulty walking, Decreased mobility, Postural dysfunction, Decreased range of motion, Decreased activity tolerance, Decreased endurance  Visit Diagnosis: Other abnormalities of gait and mobility  Unsteadiness on feet  Dizziness and giddiness     Problem List Patient Active Problem List   Diagnosis Date Noted  . Cyst of left ovary 07/04/2016  . Hyperparathyroidism ([place]) 08/24/2014  . Slow transit constipation 01/13/2014  . IBS (irritable  bowel syndrome), GERD, hx colon polyps, chronic abd pain - followed by Dr. Sharlett Iles in GI 06/11/2012  . Osteoporosis 08/29/2011  . Vitamin D deficiency 08/29/2011  . GERD (gastroesophageal reflux disease) 11/23/2010  . HYPERCHOLESTEROLEMIA 09/09/2007  . Essential hypertension 09/09/2007    Alda Lea, PT 10/24/2016, 9:29 PM  [facility] [address] [place], [place], [address] Phone: [phone]   Fax:  [phone]  Name: Serria Sloma MRN: [id number] Date of Birth: Mar 07, 1934

## 2016-10-24 NOTE — Telephone Encounter (Signed)
I am going to refer this to Dr. Maudie Mercury

## 2016-10-24 NOTE — Telephone Encounter (Signed)
I called the pt and informed her of the message below.  She stated she was told by Dr Maudie Mercury she may need to see a neurologist for the recurrent dizziness and she would like a referral for this.  Stated she went to PT and this did not help. Patient is aware this will be addressed by Dr Maudie Mercury next week when she returns to the office.

## 2016-10-27 NOTE — Telephone Encounter (Signed)
Referral placed.

## 2016-10-27 NOTE — Telephone Encounter (Signed)
Ok to refer. IF she saw someone in the last few years she may be able to schedule appt without referral. Ok to refer if needed. Thanks.

## 2016-10-30 ENCOUNTER — Encounter: Payer: Medicare Other | Admitting: Physical Therapy

## 2016-10-30 ENCOUNTER — Encounter: Payer: Self-pay | Admitting: Neurology

## 2016-11-03 ENCOUNTER — Other Ambulatory Visit: Payer: Self-pay | Admitting: Family Medicine

## 2016-11-03 ENCOUNTER — Encounter: Payer: Medicare Other | Admitting: Rehabilitative and Restorative Service Providers"

## 2016-11-03 DIAGNOSIS — Z1231 Encounter for screening mammogram for malignant neoplasm of breast: Secondary | ICD-10-CM

## 2016-11-06 ENCOUNTER — Ambulatory Visit: Payer: Medicare Other | Admitting: Family Medicine

## 2016-11-07 ENCOUNTER — Ambulatory Visit: Payer: Medicare Other | Admitting: Physical Therapy

## 2016-11-10 ENCOUNTER — Encounter: Payer: Self-pay | Admitting: Neurology

## 2016-11-10 ENCOUNTER — Ambulatory Visit (INDEPENDENT_AMBULATORY_CARE_PROVIDER_SITE_OTHER): Payer: Medicare Other | Admitting: Neurology

## 2016-11-10 VITALS — BP 142/78 | HR 64 | Ht 61.0 in | Wt 152.4 lb

## 2016-11-10 DIAGNOSIS — R42 Dizziness and giddiness: Secondary | ICD-10-CM

## 2016-11-10 DIAGNOSIS — R51 Headache: Secondary | ICD-10-CM

## 2016-11-10 DIAGNOSIS — R519 Headache, unspecified: Secondary | ICD-10-CM

## 2016-11-10 DIAGNOSIS — R2681 Unsteadiness on feet: Secondary | ICD-10-CM

## 2016-11-10 MED ORDER — DIAZEPAM 10 MG PO TABS: ORAL_TABLET | ORAL | 0 refills | Status: DC

## 2016-11-10 NOTE — Patient Instructions (Addendum)
1.  We will check MRI and MRA of the head.  I will provide you a prescription for Valium.  Have somebody drive you. 2.  We will also check an EEG 3.  For the headache, you may take ibuprofen but try to limit to no more than 2 days out of the week 4.  Let me know once you would like to start a daily preventative medication (nortriptyline or gabapentin) 5.  Follow up after testing.

## 2016-11-10 NOTE — Progress Notes (Signed)
NEUROLOGY FOLLOW UP OFFICE NOTE  Robin [name] [id number]  HISTORY OF PRESENT ILLNESS: Robin [name] is an 83 year old right-handed woman with hypertension, hypercholesterolemia, GERD and IBS who follows up for a new dizziness.  Robin [name] is accompanied by her daughter who supplements history.   Robin [name] has longstanding history of benign paroxysmal positional vertigo.  It is a spinning sensation associated with nausea and sometimes vomiting.  It is treated with vestibular rehab.  I saw her in May 2017 for a spell that occurred that previous February.  There was no preceding upper respiratory or GI viral illness.  Robin [name] underwent vestibular rehab.  The vertigo resolved, but Robin [name] has since had a new type of dizziness.  When Robin [name] was up and walking, Robin [name] felt "top heavy" in the head and unsteady on her feet.  Robin [name] denied hearing loss, tinnitus, nausea, headache, diplopia, slurred speech, focal numbness or weakness.  When Robin [name] sist down, Robin [name] feels fine.  Although, on rare occasions when Robin [name] is sitting, Robin [name] experiences a brief episode of lightheadedness and sensation of heat wave covering her body.  MRI of brain and IAC with and without contrast from 09/22/15 was personally reviewed and revealed progressive atrophy and chronic small vessel ischemic changes in the cerebrum and brainstem.  Robin [name] was advised to start ASA 81mg  daily.   About 6 weeks ago, Robin [name] developed a recurrence of dizziness.  Robin [name] describes it as a sudden sensation of a "wave washing over me", as if Robin [name] is about to faint.  Robin [name] becomes hot and flushed.  There is no associated spinning, headache, double vision, vision loss, tinnitus, hearing loss, unilateral numbness or weakness, diaphoresis, nausea, palpitations or chest discomfort.  It lasts a few seconds and occurs about twice a day.  It is not positional and occurs spontaneously.  Robin [name] has history of labile blood pressure and Robin [name] reported that her blood pressure had been elevated at home.  When Robin [name] saw  her PCP, her blood pressure was 152/74 and her Norvasc was increased.  Vestibular rehab was ineffective.  Robin [name] feels unsteady on her feet.  Robin [name] also has a constant throbbing 7/10 headache in a small region of the left posterior parietal region.  In addition, Robin [name] has an episodic holocephalic vice-like headache (sometimes 10/10), that occurs every 2 to 3 days and lasts 3-4 hours.  Robin [name] has tried Tylenol, which is ineffective. This has been ongoing since before I saw her last year.   Robin [name] did have MRI and MRA of the head back on 12/08/08 for headache and blurred vision.  It revealed extensive chronic small vessel ischemic changes in the periventricular subcortical white matter.  MRA of posterior circulation unremarkable.   PAST MEDICAL HISTORY: Past Medical History:  Diagnosis Date  . Colon polyps    tubulovillous adenoma, 2010  . External hemorrhoids without mention of complication   . GERD (gastroesophageal reflux disease)    hx PUD, Hiatal hernia  . Headache    eval with neuro in 2011  . Hyperlipemia   . Hyperparathyroidism [facility])    seeing Dr. Dwyane Dee in endocrinology  . Hypertension   . IBS (irritable bowel syndrome), GERD, hx colon polyps, chronic abd pain - followed by Dr. Sharlett Iles in GI 06/11/2012  . Osteoporosis 08/29/2011  . Vertigo    eval with neuro in 2011, brief recurrence 2016    MEDICATIONS: Current Outpatient Prescriptions on File Prior to Visit  Medication Sig Dispense Refill  . amLODipine (NORVASC) 5 MG tablet Take 1  tablet (5 mg total) by mouth daily. 90 tablet 2  . Bisacodyl (DULCOLAX PO) Take 5 mg by mouth daily as needed (constipation). As needed    . esomeprazole (NEXIUM) 40 MG capsule Take 1 capsule (40 mg total) by mouth daily at 12 noon. 30 capsule 11  . ezetimibe (ZETIA) 10 MG tablet TAKE 1 BY MOUTH DAILY 90 tablet 3  . hydrocortisone (ANUSOL-HC) 2.5 % rectal cream Apply to the rectal area 2-3 times daily as needed. 30 g 1  . linaclotide (LINZESS) 72 MCG capsule  Take 1 capsule (72 mcg total) by mouth daily before breakfast. 30 capsule 1  . lisinopril (PRINIVIL,ZESTRIL) 40 MG tablet Take 1 tablet (40 mg total) by mouth daily. 90 tablet 2  . Plecanatide (TRULANCE) 3 MG TABS Take 1 tablet by mouth daily. 14 tablet 0  . rosuvastatin (CRESTOR) 5 MG tablet Take 1/2 tablet by mouth daily. 45 tablet 1   No current facility-administered medications on file prior to visit.     ALLERGIES: Allergies  Allergen Reactions  . Prilosec [Omeprazole] Cough  . Amoxicillin     REACTION: rash  . Metronidazole     REACTION: rash  . Protonix [Pantoprazole Sodium] Swelling    FAMILY HISTORY: Family History  Problem Relation Age of Onset  . Diabetes Brother   . Hypertension Brother   . Hypertension Father   . Stroke Father   . Diabetes Sister   . Hypertension Sister   . Heart disease Sister   . CAD Mother        Died of MI at age 14  . CAD Brother        Died of MI at age 55  . Colon cancer Neg Hx   . Hypercalcemia Neg Hx     SOCIAL HISTORY: Social History   Social History  . Marital status: Widowed    Spouse name: N/A  . Number of children: 2  . Years of education: N/A   Occupational History  . retired   .  Retired   Social History Main Topics  . Smoking status: Never Smoker  . Smokeless tobacco: Never Used  . Alcohol use No  . Drug use: No  . Sexual activity: Not on file   Other Topics Concern  . Not on file   Social History Narrative   Lives alone in a one story home.  Has 2 children.  Retired from working for a bank.  Education: high school.     REVIEW OF SYSTEMS: Constitutional: No fevers, chills, or sweats, no generalized fatigue, change in appetite Eyes: No visual changes, double vision, eye pain Ear, nose and throat: No hearing loss, ear pain, nasal congestion, sore throat Cardiovascular: No chest pain, palpitations Respiratory:  No shortness of breath at rest or with exertion, wheezes GastrointestinaI: No nausea,  vomiting, diarrhea, abdominal pain, fecal incontinence Genitourinary:  No dysuria, urinary retention or frequency Musculoskeletal:  No neck pain, back pain Integumentary: No rash, pruritus, skin lesions Neurological: as above Psychiatric: No depression, insomnia, anxiety Endocrine: No palpitations, fatigue, diaphoresis, mood swings, change in appetite, change in weight, increased thirst Hematologic/Lymphatic:  No purpura, petechiae. Allergic/Immunologic: no itchy/runny eyes, nasal congestion, recent allergic reactions, rashes  PHYSICAL EXAM: Vitals:   11/10/16 1349  BP: (!) 142/78  Pulse: 64   General: Robin [name] appears a little shaky.  Patient appears well-groomed.  Head:  Normocephalic/atraumatic Eyes:  Fundi examined but not visualized Neck: supple, no paraspinal tenderness, full range of motion Heart:  Regular  rate and rhythm Lungs:  Clear to auscultation bilaterally Back: No paraspinal tenderness Neurological Exam: alert and oriented to person, place, and time. Attention span and concentration intact, recent and remote memory intact, fund of knowledge intact.  Speech fluent and not dysarthric, language intact.  CN II-XII intact. Bulk and tone normal, muscle strength 5/5 throughout.  Sensation to light touch, temperature and vibration intact.  Deep tendon reflexes 2+ throughout, toes downgoing.  Bilateral tremor when hands outstretched.  Finger to nose with kinetic tremor but no dysmetria.  Gait normal, Romberg negative.  IMPRESSION: 1.  Episodes of dizziness.  Robin [name] describes near-syncope rather than vertigo.  Suspicion is low, but also consider seizure for transient recurrent spells. 2.  Episodic tension type headache 3.  Nummular headache  PLAN: 1.  We will check MRI and MRA of the head to look for any intracranial etiology of headache and dizziness. 2.  Suspicion is low, but we will also check an EEG to assess these spells. 3.  For the headache, you may take ibuprofen but try to  limit to no more than 2 days out of the week 4.  Robin [name] would like to hold off on medication for now.  I advised her to contact me if Robin [name] would like to start a daily preventative medication (nortriptyline or gabapentin) 5.  Follow up after testing.  25 minutes spent face to face with patient, over 50% spent discussing impression and plan.  Metta Clines, DO  CC:  Colin Benton, DO

## 2016-11-13 ENCOUNTER — Encounter: Payer: Self-pay | Admitting: Family Medicine

## 2016-11-13 ENCOUNTER — Other Ambulatory Visit: Payer: Self-pay | Admitting: *Deleted

## 2016-11-13 ENCOUNTER — Ambulatory Visit (INDEPENDENT_AMBULATORY_CARE_PROVIDER_SITE_OTHER): Payer: Medicare Other | Admitting: Family Medicine

## 2016-11-13 ENCOUNTER — Ambulatory Visit (INDEPENDENT_AMBULATORY_CARE_PROVIDER_SITE_OTHER): Payer: Medicare Other | Admitting: Neurology

## 2016-11-13 ENCOUNTER — Telehealth: Payer: Self-pay | Admitting: *Deleted

## 2016-11-13 ENCOUNTER — Encounter: Payer: Medicare Other | Admitting: Rehabilitative and Restorative Service Providers"

## 2016-11-13 VITALS — BP 138/70 | HR 90 | Temp 98.4°F | Ht 61.0 in | Wt 152.4 lb

## 2016-11-13 DIAGNOSIS — R519 Headache, unspecified: Secondary | ICD-10-CM

## 2016-11-13 DIAGNOSIS — E7800 Pure hypercholesterolemia, unspecified: Secondary | ICD-10-CM | POA: Diagnosis not present

## 2016-11-13 DIAGNOSIS — I700 Atherosclerosis of aorta: Secondary | ICD-10-CM | POA: Diagnosis not present

## 2016-11-13 DIAGNOSIS — R42 Dizziness and giddiness: Secondary | ICD-10-CM

## 2016-11-13 DIAGNOSIS — R2681 Unsteadiness on feet: Secondary | ICD-10-CM

## 2016-11-13 DIAGNOSIS — I10 Essential (primary) hypertension: Secondary | ICD-10-CM

## 2016-11-13 DIAGNOSIS — I679 Cerebrovascular disease, unspecified: Secondary | ICD-10-CM

## 2016-11-13 DIAGNOSIS — R2689 Other abnormalities of gait and mobility: Secondary | ICD-10-CM | POA: Diagnosis not present

## 2016-11-13 DIAGNOSIS — R51 Headache: Secondary | ICD-10-CM

## 2016-11-13 MED ORDER — LISINOPRIL 40 MG PO TABS: 40.0000 mg | ORAL_TABLET | Freq: Every day | ORAL | 3 refills | Status: DC

## 2016-11-13 MED ORDER — AMLODIPINE BESYLATE 5 MG PO TABS: 5.0000 mg | ORAL_TABLET | Freq: Every day | ORAL | 3 refills | Status: DC

## 2016-11-13 MED ORDER — ROSUVASTATIN CALCIUM 5 MG PO TABS: ORAL_TABLET | ORAL | 3 refills | Status: DC

## 2016-11-13 MED ORDER — EZETIMIBE 10 MG PO TABS: ORAL_TABLET | ORAL | 3 refills | Status: DC

## 2016-11-13 NOTE — Telephone Encounter (Signed)
===  View-only below this line===  ----- Message ----- From: Pieter Partridge, DO Sent: 11/13/2016  10:54 AM To: Oda Kilts, CMA  Please let patient know that EEG was normal.

## 2016-11-13 NOTE — Telephone Encounter (Signed)
Called patient to inform EEG is Normal  She was very happy about that news She will call back if she has any further questions

## 2016-11-13 NOTE — Telephone Encounter (Signed)
Patient returning your call.

## 2016-11-13 NOTE — Progress Notes (Signed)
HPI:  Robin [name] is a pleasant 83 y.o. here for follow up. Chronic medical problems summarized below were reviewed for changes. She reports the dizziness and balance issues are improving some. Seeing neurologist. Had EEG today and MRA/MRI scheduled. O/w doing well. Denies CP, SOB, DOE, treatment intolerance or new symptoms. Due of annual exam and labs, but wants to hold off on scheduling this right away. Needs refills on medications.  HTN/Aortic atherosclerosis: -she keeps home log and BP runs much better at home, we have checked her cuff  -lisinopril 40mg , norvasc 5mg  -endo had rxd lasix, she stopped it on her own and reports has not had any issues with swelling  Dizziness/Balance issues: -neurology eval recently 11/10/16 - EEG (normal), MRI/MRA planned -had CT 2016, MRI 2017  HLD: -meds: crestor 2.5mg  daily, zetia 10mg  -hx statin intol  Hypercalcemia -seeing endocrinologist -thought 2ndary to vit D def per endo notes, pt was advised to stop Vit D supplement after last set labs with endo  GERD: -she stopped taking her PPI, used to take nexium 40, reports takes rarely when has symptoms -hx hiatal hernia and gastritis  ROS: See pertinent positives and negatives per HPI.  Past Medical History:  Diagnosis Date  . Colon polyps    tubulovillous adenoma, 2010  . External hemorrhoids without mention of complication   . GERD (gastroesophageal reflux disease)    hx PUD, Hiatal hernia  . Headache    eval with neuro in 2011  . Hyperlipemia   . Hyperparathyroidism [facility])    seeing Dr. Dwyane Dee in endocrinology  . Hypertension   . IBS (irritable bowel syndrome), GERD, hx colon polyps, chronic abd pain - followed by Dr. Sharlett Iles in GI 06/11/2012  . Osteoporosis 08/29/2011  . Vertigo    eval with neuro in 2011, brief recurrence 2016    Past Surgical History:  Procedure Laterality Date  . BACK SURGERY  1974  . CARPAL TUNNEL RELEASE  2/10  . TONSILLECTOMY      Family History   Problem Relation Age of Onset  . Diabetes Brother   . Hypertension Brother   . Hypertension Father   . Stroke Father   . Diabetes Sister   . Hypertension Sister   . Heart disease Sister   . CAD Mother        Died of MI at age 84  . CAD Brother        Died of MI at age 75  . Colon cancer Neg Hx   . Hypercalcemia Neg Hx     Social History   Social History  . Marital status: Widowed    Spouse name: N/A  . Number of children: 2  . Years of education: N/A   Occupational History  . retired   .  Retired   Social History Main Topics  . Smoking status: Never Smoker  . Smokeless tobacco: Never Used  . Alcohol use No  . Drug use: No  . Sexual activity: Not Asked   Other Topics Concern  . None   Social History Narrative   Lives alone in a one story home.  Has 2 children.  Retired from working for a bank.  Education: high school.      Current Outpatient Prescriptions:  .  amLODipine (NORVASC) 5 MG tablet, Take 1 tablet (5 mg total) by mouth daily., Disp: 90 tablet, Rfl: 3 .  Bisacodyl (DULCOLAX PO), Take 5 mg by mouth daily as needed (constipation). As needed, Disp: , Rfl:  .  diazepam (VALIUM) 10 MG tablet, Take 10mg  30 minutes prior to MRI., Disp: 1 tablet, Rfl: 0 .  esomeprazole (NEXIUM) 40 MG capsule, Take 1 capsule (40 mg total) by mouth daily at 12 noon. (Patient taking differently: Take 40 mg by mouth daily as needed. ), Disp: 30 capsule, Rfl: 11 .  ezetimibe (ZETIA) 10 MG tablet, TAKE 1 BY MOUTH DAILY, Disp: 90 tablet, Rfl: 3 .  hydrocortisone (ANUSOL-HC) 2.5 % rectal cream, Apply to the rectal area 2-3 times daily as needed., Disp: 30 g, Rfl: 1 .  linaclotide (LINZESS) 72 MCG capsule, Take 1 capsule (72 mcg total) by mouth daily before breakfast., Disp: 30 capsule, Rfl: 1 .  lisinopril (PRINIVIL,ZESTRIL) 40 MG tablet, Take 1 tablet (40 mg total) by mouth daily., Disp: 90 tablet, Rfl: 3 .  rosuvastatin (CRESTOR) 5 MG tablet, Take 1/2 tablet by mouth daily., Disp: 45  tablet, Rfl: 3  EXAM:  Vitals:   11/13/16 1255  BP: 138/70  Pulse: 90  Temp: 98.4 F (36.9 C)    Body mass index is 28.8 kg/m.  GENERAL: vitals reviewed and listed above, alert, oriented, appears well hydrated and in no acute distress  HEENT: atraumatic, conjunttiva clear, no obvious abnormalities on inspection of external nose and ears  NECK: no obvious masses on inspection  LUNGS: clear to auscultation bilaterally, no wheezes, rales or rhonchi, good air movement  CV: HRRR, no peripheral edema  MS: moves all extremities without noticeable abnormality  PSYCH: pleasant and cooperative, no obvious depression or anxiety  ASSESSMENT AND PLAN:  Discussed the following assessment and plan:  Essential hypertension  HYPERCHOLESTEROLEMIA - Plan: rosuvastatin (CRESTOR) 5 MG tablet  Aortic atherosclerosis (HCC)  Balance problem  Dizziness  -complete eval with neurology as planned, glad she is feeling better -AWV and labs when convenient for her - advised in 1 month when due, but she would like to wait -lifestyle recs -refills -Patient advised to return or notify a doctor immediately if symptoms worsen or persist or new concerns arise.  Patient Instructions  BEFORE YOU LEAVE: -refill meds x 1 year -follow up: 1) AWV with Manuela Schwartz and follow up with Dr. Maudie Mercury - same day in 3 months, fasting labs that day if possible  Continue current treatment.  We recommend the following healthy lifestyle for LIFE: 1) Small portions.   Tip: eat off of a salad plate instead of a dinner plate.  2) Eat a healthy clean diet.   TRY TO EAT: -at least 5-7 servings of low sugar vegetables per day (not corn, potatoes or bananas.) -berries are the best choice if you wish to eat fruit.   -lean meets (fish, chicken or [place] breasts) -vegan proteins for some meals - beans or tofu, whole grains, nuts and seeds -Replace bad fats with good fats - good fats include: fish, nuts and seeds, canola  oil, olive oil -small amounts of low fat or non fat dairy -small amounts of100 % whole grains - check the lables  AVOID: -SUGAR, sweets, anything with added sugar, corn syrup or sweeteners -if you must have a sweetener, small amounts of stevia may be best -sweetened beverages -simple starches (rice, bread, potatoes, pasta, chips, etc - small amounts of 100% whole grains are ok) -red meat, pork, butter -fried foods, fast food, processed food, excessive dairy, eggs and coconut.  3)Get at least 150 minutes of sweaty aerobic exercise per week.  4)Reduce stress - consider counseling, meditation and relaxation to balance other aspects of your life.  Colin Benton R., DO

## 2016-11-13 NOTE — Patient Instructions (Signed)
BEFORE YOU LEAVE: -refill meds x 1 year -follow up: 1) AWV with Manuela Schwartz and follow up with Dr. Maudie Mercury - same day in 3 months, fasting labs that day if possible  Continue current treatment.  We recommend the following healthy lifestyle for LIFE: 1) Small portions.   Tip: eat off of a salad plate instead of a dinner plate.  2) Eat a healthy clean diet.   TRY TO EAT: -at least 5-7 servings of low sugar vegetables per day (not corn, potatoes or bananas.) -berries are the best choice if you wish to eat fruit.   -lean meets (fish, chicken or [place] breasts) -vegan proteins for some meals - beans or tofu, whole grains, nuts and seeds -Replace bad fats with good fats - good fats include: fish, nuts and seeds, canola oil, olive oil -small amounts of low fat or non fat dairy -small amounts of100 % whole grains - check the lables  AVOID: -SUGAR, sweets, anything with added sugar, corn syrup or sweeteners -if you must have a sweetener, small amounts of stevia may be best -sweetened beverages -simple starches (rice, bread, potatoes, pasta, chips, etc - small amounts of 100% whole grains are ok) -red meat, pork, butter -fried foods, fast food, processed food, excessive dairy, eggs and coconut.  3)Get at least 150 minutes of sweaty aerobic exercise per week.  4)Reduce stress - consider counseling, meditation and relaxation to balance other aspects of your life.

## 2016-11-13 NOTE — Progress Notes (Addendum)
ELECTROENCEPHALOGRAM REPORT  Date of Study: 11/13/2016  Patient's Name: Robin [name] MRN: [id number] Date of Birth: 02/04/1934  Clinical History: 83 year old woman with recurrent spells of dizziness.  Medications: NORVASC  DULCOLAX PO NEXIUM ZETIA ANUSOL-HC LINZESS PRINIVIL,ZESTRIL TRULANCE CRESTOR  Technical Summary: A multichannel digital EEG recording measured by the international 10-20 system with electrodes applied with paste and impedances below 5000 ohms performed in our laboratory with EKG monitoring in an awake and drowsy patient.  Hyperventilation and photic stimulation were performed.  The digital EEG was referentially recorded, reformatted, and digitally filtered in a variety of bipolar and referential montages for optimal display.    Description: The patient is awake and drowsy during the recording.  During maximal wakefulness, there is a symmetric, medium voltage 11 Hz posterior dominant rhythm that attenuates with eye opening.  The record is symmetric.  During drowsiness, there is an increase in theta slowing of the background.  Stage 2 sleep was not seen.  Hyperventilation and photic stimulation did not elicit any abnormalities.  There were no epileptiform discharges or electrographic seizures seen.    EKG lead was unremarkable.  Impression: This awake and drowsy EEG is normal.    Clinical Correlation: A normal EEG does not exclude a clinical diagnosis of epilepsy.  If further clinical questions remain, prolonged EEG may be helpful.  Clinical correlation is advised.   Metta Clines, DO

## 2016-11-16 ENCOUNTER — Encounter: Payer: Self-pay | Admitting: Rehabilitative and Restorative Service Providers"

## 2016-11-16 NOTE — Therapy (Signed)
[facility] [address] [place], [place], [address] Phone: [phone]   Fax:  [phone]  Patient Details  Name: Saba Gomm MRN: [id number] Date of Birth: 28-Nov-1933 Referring Provider:  Colin Benton, DO  Encounter Date: last treatment 10/24/2016  PHYSICAL THERAPY DISCHARGE SUMMARY  Visits from Start of Care: 2  Current functional level related to goals / functional outcomes: *see intitial evaluation- patient returned to primary care and had neurology consult due to worsening symptoms   Remaining deficits: See initial evaluation   Education / Equipment: Home program.  Patient to hold PT- awaiting further medical workup.  Plan: Patient agrees to discharge.  Patient goals were not met. Patient is being discharged due to a change in medical status.  ?????        Thank you for the referral of this patient. Rudell Cobb, MPT    [place] 11/16/2016, 9:05 AM  [facility] [address] [place], [place], [address] Phone: [phone]   Fax:  [phone]

## 2016-11-20 ENCOUNTER — Encounter: Payer: Medicare Other | Admitting: Rehabilitative and Restorative Service Providers"

## 2016-11-22 ENCOUNTER — Ambulatory Visit: Payer: Medicare Other | Admitting: Neurology

## 2016-11-25 ENCOUNTER — Ambulatory Visit
Admission: RE | Admit: 2016-11-25 | Discharge: 2016-11-25 | Disposition: A | Discharge status: Home / Self Care | Payer: Medicare Other | Source: Ambulatory Visit | Attending: Neurology | Admitting: Neurology

## 2016-11-25 DIAGNOSIS — R42 Dizziness and giddiness: Secondary | ICD-10-CM

## 2016-11-25 DIAGNOSIS — R519 Headache, unspecified: Secondary | ICD-10-CM

## 2016-11-25 DIAGNOSIS — R2681 Unsteadiness on feet: Secondary | ICD-10-CM

## 2016-11-25 DIAGNOSIS — R51 Headache: Secondary | ICD-10-CM

## 2016-11-27 ENCOUNTER — Encounter: Payer: Medicare Other | Admitting: Rehabilitative and Restorative Service Providers"

## 2016-11-27 ENCOUNTER — Telehealth: Payer: Self-pay

## 2016-11-27 NOTE — Telephone Encounter (Signed)
-----   Message from Pieter Partridge, DO sent at 11/27/2016  9:11 AM EDT ----- I reviewed the MRI/MRA of the head.  It reveals no explanation for her dizziness.  It does show chronic changes in the brain related to history of high blood pressure and high cholesterol, so I would recommend taking aspirin 81mg  daily.  Again, I have no neurologic explanation for her dizziness.

## 2016-11-27 NOTE — Telephone Encounter (Signed)
Spoke to patient. Gave MRI results. Pt unable to take 81mg  Aspirin due to stomach issues. Advised pt to f/u w/ PCP.  Patient verbalized understanding.

## 2016-11-30 ENCOUNTER — Other Ambulatory Visit: Payer: Self-pay | Admitting: *Deleted

## 2016-11-30 DIAGNOSIS — I679 Cerebrovascular disease, unspecified: Secondary | ICD-10-CM

## 2016-11-30 NOTE — Progress Notes (Unsigned)
ct 

## 2016-12-01 ENCOUNTER — Telehealth: Payer: Self-pay | Admitting: Neurology

## 2016-12-01 NOTE — Telephone Encounter (Signed)
Left message informing patient that this test has been set up to take a look at the vessels in her head to make sure there is no blockages.

## 2016-12-01 NOTE — Telephone Encounter (Signed)
Caller: Inez Catalina  Urgent? No  Reason for the call: She just received a call regarding setting up a CT Angiogram? She had an MRI on 11/26/16. She was wanting to make sure this is something Dr. Tomi Likens has ordered? She has not heard from our office regarding this. Please Cal. Thanks

## 2016-12-06 ENCOUNTER — Ambulatory Visit
Admission: RE | Admit: 2016-12-06 | Discharge: 2016-12-06 | Disposition: A | Discharge status: Home / Self Care | Payer: Medicare Other | Source: Ambulatory Visit | Attending: Neurology | Admitting: Neurology

## 2016-12-06 DIAGNOSIS — I679 Cerebrovascular disease, unspecified: Secondary | ICD-10-CM

## 2016-12-06 DIAGNOSIS — R51 Headache: Secondary | ICD-10-CM | POA: Diagnosis not present

## 2016-12-06 MED ORDER — IOPAMIDOL (ISOVUE-370) INJECTION 76%
75.0000 mL | Freq: Once | INTRAVENOUS | Status: AC | PRN
Start: 2016-12-06 — End: 2016-12-06
  Administered 2016-12-06: 75 mL via INTRAVENOUS

## 2016-12-07 ENCOUNTER — Telehealth: Payer: Self-pay | Admitting: *Deleted

## 2016-12-07 NOTE — Telephone Encounter (Signed)
Left message informing patient.

## 2016-12-07 NOTE — Telephone Encounter (Signed)
-----   Message from Pieter Partridge, DO sent at 12/07/2016  7:13 AM EDT ----- CT of arteries in the head look okay.  It reveals no cause for dizziness.

## 2016-12-15 ENCOUNTER — Encounter: Payer: Self-pay | Admitting: Neurology

## 2016-12-15 ENCOUNTER — Ambulatory Visit (INDEPENDENT_AMBULATORY_CARE_PROVIDER_SITE_OTHER): Payer: Medicare Other | Admitting: Neurology

## 2016-12-15 ENCOUNTER — Ambulatory Visit: Payer: Medicare Other | Admitting: Neurology

## 2016-12-15 VITALS — BP 120/82 | HR 70 | Ht 62.0 in | Wt 152.0 lb

## 2016-12-15 DIAGNOSIS — I679 Cerebrovascular disease, unspecified: Secondary | ICD-10-CM | POA: Diagnosis not present

## 2016-12-15 DIAGNOSIS — G44229 Chronic tension-type headache, not intractable: Secondary | ICD-10-CM

## 2016-12-15 DIAGNOSIS — I10 Essential (primary) hypertension: Secondary | ICD-10-CM

## 2016-12-15 DIAGNOSIS — R42 Dizziness and giddiness: Secondary | ICD-10-CM | POA: Diagnosis not present

## 2016-12-15 DIAGNOSIS — E785 Hyperlipidemia, unspecified: Secondary | ICD-10-CM

## 2016-12-15 MED ORDER — GABAPENTIN 100 MG PO CAPS: ORAL_CAPSULE | ORAL | 0 refills | Status: DC

## 2016-12-15 NOTE — Patient Instructions (Signed)
1.  To treat headaches, we will start gabapentin 100mg  capsules:  Take 1 capsule at bedtime for 7 days,  Then 1 capsule in morning and 1 capsule at bedtime for 7 days  Then 1 capsule in morning and 2 capsules at bedtime for 7 days  Then 2 capsules in morning and 2 capsules at bedtime  Once you have been on 2 capsules twice daily for 4 weeks and headaches not improved, contact me and we can increase dose further. 2.  Take aspirin enteric coated 81mg  daily 3.  We will refer you to Dr. [name] for dizziness 4.  Follow up in 3 months.

## 2016-12-15 NOTE — Progress Notes (Signed)
NEUROLOGY FOLLOW UP OFFICE NOTE  Robin [name] [id number]  HISTORY OF PRESENT ILLNESS: Robin [name] is an 83 year old right-handed woman with hypertension, hypercholesterolemia, GERD and IBS who follows up for dizziness, episodic tension type headache and nummular headache.  UPDATE: MRI of brain without contrast from 11/25/16 was personally reviewed and demonstrated chronic small vessel ischemic changes in the cerebral white matter and pons.  MRA did not reveal any vertebrobasilar stenosis/occlusion or other significant large vessel stenosis or occlusion.  CTA revealed normal variant circle of Willis. As episodes were recurrent, she had an EEG performed on 11/13/16, which was normal.  Dizziness has improved but it is still present.  She hasn't had the episodic "wave" sensations.  She would like a referral to see Dr. [name] of cardiology for a re-evaluation.  Headaches are unchanged.  Nummular headache is constant..  The tension type headache is episodic but occurs every 2 to 3 days.    HISTORY: Ms. Lahaie has longstanding history of benign paroxysmal positional vertigo.  It is a spinning sensation associated with nausea and sometimes vomiting.  It is treated with vestibular rehab.  I saw her in May 2017 for a spell that occurred that previous February.  There was no preceding upper respiratory or GI viral illness.  She underwent vestibular rehab.  The vertigo resolved, but she has since had a new type of dizziness.  When she was up and walking, she felt "top heavy" in the head and unsteady on her feet.  She denied hearing loss, tinnitus, nausea, headache, diplopia, slurred speech, focal numbness or weakness.  When she sits down, she feels fine.  Although, on rare occasions when she is sitting, she experiences a brief episode of lightheadedness and sensation of heat wave covering her body.  MRI of brain and IAC with and without contrast from 09/22/15 was personally reviewed and revealed  progressive atrophy and chronic small vessel ischemic changes in the cerebrum and brainstem.  She was advised to start ASA 81mg  daily.   In May 2018, she developed a recurrence of dizziness.  She describes it as a sudden sensation of a "wave washing over me", as if she is about to faint.  She becomes hot and flushed.  There is no associated spinning, headache, double vision, vision loss, tinnitus, hearing loss, unilateral numbness or weakness, diaphoresis, nausea, palpitations or chest discomfort.  It lasts a few seconds and occurs about twice a day.  It is not positional and occurs spontaneously.  She has history of labile blood pressure and she reported that her blood pressure had been elevated at home.  When she saw her PCP, her blood pressure was 152/74 and her Norvasc was increased.  Vestibular rehab was ineffective.  She feels unsteady on her feet.   For several years, she has a constant throbbing 7/10 headache in a small region of the left posterior parietal region.  In addition, she has an episodic holocephalic vice-like headache (sometimes 10/10), that occurs every 2 to 3 days and lasts 3-4 hours.  She has tried Tylenol, which is ineffective. This has been ongoing since at least 2016.  There are no triggers, exacerbating or relieving factors.   She did have MRI and MRA of the head back on 12/08/08 for headache and blurred vision.  It revealed extensive chronic small vessel ischemic changes in the periventricular subcortical white matter.  MRA of posterior circulation unremarkable.  PAST MEDICAL HISTORY: Past Medical History:  Diagnosis Date  . Colon polyps  tubulovillous adenoma, 2010  . External hemorrhoids without mention of complication   . GERD (gastroesophageal reflux disease)    hx PUD, Hiatal hernia  . Headache    eval with neuro in 2011  . Hyperlipemia   . Hyperparathyroidism [facility])    seeing Dr. Dwyane Dee in endocrinology  . Hypertension   . IBS (irritable bowel syndrome), GERD,  hx colon polyps, chronic abd pain - followed by Dr. Sharlett Iles in GI 06/11/2012  . Osteoporosis 08/29/2011  . Vertigo    eval with neuro in 2011, brief recurrence 2016    MEDICATIONS: Current Outpatient Prescriptions on File Prior to Visit  Medication Sig Dispense Refill  . amLODipine (NORVASC) 5 MG tablet Take 1 tablet (5 mg total) by mouth daily. 90 tablet 3  . Bisacodyl (DULCOLAX PO) Take 5 mg by mouth daily as needed (constipation). As needed    . esomeprazole (NEXIUM) 40 MG capsule Take 1 capsule (40 mg total) by mouth daily at 12 noon. (Patient taking differently: Take 40 mg by mouth daily as needed. ) 30 capsule 11  . ezetimibe (ZETIA) 10 MG tablet TAKE 1 BY MOUTH DAILY 90 tablet 3  . hydrocortisone (ANUSOL-HC) 2.5 % rectal cream Apply to the rectal area 2-3 times daily as needed. 30 g 1  . lisinopril (PRINIVIL,ZESTRIL) 40 MG tablet Take 1 tablet (40 mg total) by mouth daily. 90 tablet 3  . rosuvastatin (CRESTOR) 5 MG tablet Take 1/2 tablet by mouth daily. 45 tablet 3   No current facility-administered medications on file prior to visit.     ALLERGIES: Allergies  Allergen Reactions  . Prilosec [Omeprazole] Cough  . Amoxicillin     REACTION: rash  . Metronidazole     REACTION: rash  . Protonix [Pantoprazole Sodium] Swelling    FAMILY HISTORY: Family History  Problem Relation Age of Onset  . Diabetes Brother   . Hypertension Brother   . Hypertension Father   . Stroke Father   . Diabetes Sister   . Hypertension Sister   . Heart disease Sister   . CAD Mother        Died of MI at age 65  . CAD Brother        Died of MI at age 46  . Colon cancer Neg Hx   . Hypercalcemia Neg Hx     SOCIAL HISTORY: Social History   Social History  . Marital status: Widowed    Spouse name: N/A  . Number of children: 2  . Years of education: N/A   Occupational History  . retired   .  Retired   Social History Main Topics  . Smoking status: Never Smoker  . Smokeless tobacco:  Never Used  . Alcohol use No  . Drug use: No  . Sexual activity: Not on file   Other Topics Concern  . Not on file   Social History Narrative   Lives alone in a one story home.  Has 2 children.  Retired from working for a bank.  Education: high school.     REVIEW OF SYSTEMS: Constitutional: No fevers, chills, or sweats, no generalized fatigue, change in appetite Eyes: No visual changes, double vision, eye pain Ear, nose and throat: No hearing loss, ear pain, nasal congestion, sore throat Cardiovascular: No chest pain, palpitations Respiratory:  No shortness of breath at rest or with exertion, wheezes GastrointestinaI: No nausea, vomiting, diarrhea, abdominal pain, fecal incontinence Genitourinary:  No dysuria, urinary retention or frequency Musculoskeletal:  No neck pain,  back pain Integumentary: No rash, pruritus, skin lesions Neurological: as above Psychiatric: No depression, insomnia, anxiety Endocrine: No palpitations, fatigue, diaphoresis, mood swings, change in appetite, change in weight, increased thirst Hematologic/Lymphatic:  No purpura, petechiae. Allergic/Immunologic: no itchy/runny eyes, nasal congestion, recent allergic reactions, rashes  PHYSICAL EXAM: Vitals:   12/15/16 1255  BP: 120/82  Pulse: 70   General: No acute distress.  Patient appears well-groomed.   Head:  Normocephalic/atraumatic Eyes:  Fundi examined but not visualized Neck: supple, no paraspinal tenderness, full range of motion Heart:  Regular rate and rhythm Lungs:  Clear to auscultation bilaterally Back: No paraspinal tenderness Neurological Exam: alert and oriented to person, place, and time. Attention span and concentration intact, recent and remote memory intact, fund of knowledge intact.  Speech fluent and not dysarthric, language intact.  CN II-XII intact. Bulk and tone normal, muscle strength 5/5 throughout.  Sensation to light touch, temperature and vibration intact.  Deep tendon reflexes  2+ throughout, toes downgoing.  Bilateral tremor when hands outstretched.  Finger to nose with kinetic tremor but no dysmetria.  Gait normal, Romberg with mild sway.  IMPRESSION: 1.  Chronic nummular headache 2.  Episodic tension-type headache 3.  Dizziness, subjective.  Unclear etiology.  There is no vertebrobasilar insufficiency. 4.  Cerebrovascular disease 5.  Hypertension 6.  Hyperlipidemia  PLAN: 1.  To treat nummular headache and tension type headache, we will start gabapentin 100mg  at bedtime, slowly titrating to 200mg  twice daily.  If headaches not improved in 4 weeks after taking 200mg  twice daily, we can titrate to 400mg  twice daily. 2.  Advised to start ASA 81mg  daily regarding cerebrovascular disease 3.  Optimize blood pressure control and statin therapy. 4.  At her request, I will refer her to her prior cardiologist, Dr. [name], for further evaluation of dizziness. 5.  Follow up with me in 3 months.  18 minutes spent face to face with patient, over 50% spent discussing diagnoses and management.  Metta Clines, DO  CC:  Colin Benton, DO

## 2016-12-18 ENCOUNTER — Ambulatory Visit
Admission: RE | Admit: 2016-12-18 | Discharge: 2016-12-18 | Disposition: A | Discharge status: Home / Self Care | Payer: Medicare Other | Source: Ambulatory Visit | Attending: Family Medicine | Admitting: Family Medicine

## 2016-12-18 DIAGNOSIS — Z1231 Encounter for screening mammogram for malignant neoplasm of breast: Secondary | ICD-10-CM

## 2017-01-26 ENCOUNTER — Encounter: Payer: Self-pay | Admitting: Cardiology

## 2017-01-26 NOTE — Progress Notes (Signed)
Referring-Adam Tomi Likens DO Reason for referral-Dizziness  HPI: 83 year old female for evaluation of dizziness at request of Metta Clines D.O. Seen previously but not since April 2014. ABIs August 2010 normal. Stress echocardiogram February 2014 normal. MRI July 2018 showed chronic small vessel ischemic changes in the cerebral white matter and pons. MRA without significant vertebrobasilar disease. CTA showed normal circle of Willis. EEG also normal. Patient has chronic mild dizziness. From May to July she had increased assistant dizziness continuously. There was no change with position. It occurred both at rest and with activities. However she also had episodes of near syncope when she was suddenly feel like she was going to pass out. This would last several seconds and resolved. Not related to position. No associated chest pain, dyspnea, nausea, diaphoresis or palpitations. No incontinence. Since July her symptoms have resolved. She otherwise has not had significant dyspnea on exertion, exertional chest pain or pedal edema. Because of the above we were asked to evaluate.  Current Outpatient Prescriptions  Medication Sig Dispense Refill  . amLODipine (NORVASC) 5 MG tablet Take 1 tablet (5 mg total) by mouth daily. 90 tablet 3  . Bisacodyl (DULCOLAX PO) Take 5 mg by mouth daily as needed (constipation). As needed    . esomeprazole (NEXIUM) 40 MG capsule Take 1 capsule (40 mg total) by mouth daily at 12 noon. (Patient taking differently: Take 40 mg by mouth daily as needed. ) 30 capsule 11  . ezetimibe (ZETIA) 10 MG tablet TAKE 1 BY MOUTH DAILY 90 tablet 3  . hydrocortisone (ANUSOL-HC) 2.5 % rectal cream Apply to the rectal area 2-3 times daily as needed. 30 g 1  . lisinopril (PRINIVIL,ZESTRIL) 40 MG tablet Take 1 tablet (40 mg total) by mouth daily. 90 tablet 3  . rosuvastatin (CRESTOR) 5 MG tablet Take 1/2 tablet by mouth daily. 45 tablet 3   No current facility-administered medications for this  visit.     Allergies  Allergen Reactions  . Prilosec [Omeprazole] Cough  . Amoxicillin     REACTION: rash  . Metronidazole     REACTION: rash  . Protonix [Pantoprazole Sodium] Swelling     Past Medical History:  Diagnosis Date  . Asthma   . Colon polyps    tubulovillous adenoma, 2010  . External hemorrhoids without mention of complication   . Headache    eval with neuro in 2011  . Hyperlipemia   . Hyperparathyroidism [facility])    seeing Dr. Dwyane Dee in endocrinology  . Hypertension   . IBS (irritable bowel syndrome), GERD, hx colon polyps, chronic abd pain - followed by Dr. Sharlett Iles in GI 06/11/2012  . Osteoporosis 08/29/2011  . Vertigo    eval with neuro in 2011, brief recurrence 2016    Past Surgical History:  Procedure Laterality Date  . BACK SURGERY  1974  . CARPAL TUNNEL RELEASE  2/10  . TONSILLECTOMY      Social History   Social History  . Marital status: Widowed    Spouse name: N/A  . Number of children: 2  . Years of education: N/A   Occupational History  . retired   .  Retired   Social History Main Topics  . Smoking status: Never Smoker  . Smokeless tobacco: Never Used  . Alcohol use No  . Drug use: No  . Sexual activity: Not on file   Other Topics Concern  . Not on file   Social History Narrative   Lives alone in a one story  home.  Has 2 children.  Retired from working for a bank.  Education: high school.     Family History  Problem Relation Age of Onset  . Diabetes Brother   . Hypertension Brother   . Hypertension Father   . Stroke Father   . Diabetes Sister   . Hypertension Sister   . Heart disease Sister   . CAD Mother        Died of MI at age 68  . CAD Brother        Died of MI at age 58  . Colon cancer Neg Hx   . Hypercalcemia Neg Hx     ROS: no fevers or chills, productive cough, hemoptysis, dysphasia, odynophagia, melena, hematochezia, dysuria, hematuria, rash, seizure activity, orthopnea, PND, pedal edema, claudication.  Remaining systems are negative.  Physical Exam:   Blood pressure (!) 146/70, pulse 76, height 5\' 2"  (1.575 m), weight 70.3 kg (155 lb).  General:  Well developed/well nourished in NAD Skin warm/dry Patient not depressed No peripheral clubbing Back-normal HEENT-normal/normal eyelids Neck supple/normal carotid upstroke bilaterally; no bruits; no JVD; no thyromegaly chest - CTA/ normal expansion CV - RRR/normal S1 and S2; no rubs or gallops;  PMI nondisplaced; 2/6 SEM Abdomen -NT/ND, no HSM, no mass, + bowel sounds, no bruit 2+ femoral pulses, no bruits Ext-no edema, chords, 2+ DP Neuro-grossly nonfocal  ECG - Sinus rhythm with occasional PVC. Left axis deviation. Left ventricular hypertrophy. personally reviewed  A/P  1 near syncope-etiology unclear. Her chronic dizziness for 2 months is unlikely cardiac related. However her near syncopal episodes are more concerning. However they have resolved in the past 2 months. I will arrange an echocardiogram to assess LV function. If she has recurrent symptoms we will place an event monitor.  2 hypertension-Blood pressure is controlled. Continue present medications.  3 hyperlipidemia-continue statin.  Kirk Ruths, MD

## 2017-02-08 ENCOUNTER — Ambulatory Visit (INDEPENDENT_AMBULATORY_CARE_PROVIDER_SITE_OTHER): Payer: Medicare Other | Admitting: Cardiology

## 2017-02-08 ENCOUNTER — Encounter: Payer: Self-pay | Admitting: Cardiology

## 2017-02-08 VITALS — BP 146/70 | HR 76 | Ht 62.0 in | Wt 155.0 lb

## 2017-02-08 DIAGNOSIS — R42 Dizziness and giddiness: Secondary | ICD-10-CM

## 2017-02-08 DIAGNOSIS — R55 Syncope and collapse: Secondary | ICD-10-CM | POA: Diagnosis not present

## 2017-02-08 DIAGNOSIS — E7800 Pure hypercholesterolemia, unspecified: Secondary | ICD-10-CM

## 2017-02-08 DIAGNOSIS — I10 Essential (primary) hypertension: Secondary | ICD-10-CM | POA: Diagnosis not present

## 2017-02-08 NOTE — Patient Instructions (Signed)

## 2017-02-12 NOTE — Progress Notes (Signed)
HPI:  Robin [name] is a pleasant 83 y.o. here for follow up. Chronic medical problems summarized below were reviewed for changes. She has chronic acid reflux and constipation issues. She sees GI for this and has an appointment coming up in several weeks. However, she reports she has had worsening constipation the last few weeks. Has small bowel movements that are hard and associated with straining several days per week. Feels bloated and stopped up. She restarted her Nexium for acid reflux and those symptoms resolved. Denies heartburn, choking, melena, hematochezia, unexplained weight loss or malaise. Reports vertigo symptoms much better. Not taking asa - reports afraid it will hurt her stomach. Denies CP, SOB, DOE, treatment intolerance or new symptoms.  Due for flu shot and BP labs, lipids. Did AWV with Manuela Schwartz this morning.  HTN/Aortic atherosclerosis: -she keeps home log and BP runs much better at home, we have checked her cuff  -lisinopril '40mg'$ , norvasc '5mg'$  -endo had rxd lasix, she stopped it on her own and reports has not had any issues with swelling  Dizziness/Balance issues/HEadaches: -neurology eval 11/10/16 - EEG (normal), MRI with chronic sm vessel disease and asa was advised, CTA ok per neurology notes -neurology advised gabapentin for headaches -neurology referred to cardiology for eval , echo planned in october  HLD: -meds: crestor 2.'5mg'$  daily, zetia '10mg'$  -hx statin intol  Hypercalcemia -seeing endocrinologist -thought 2ndary to vit D def per endo notes, pt was advised to stop Vit D supplement   GERD: -she stopped taking her PPI, used to take nexium 40, reports takes rarely when has symptoms -hx hiatal hernia and gastritis  ROS: See pertinent positives and negatives per HPI.  Past Medical History:  Diagnosis Date  . Asthma   . Colon polyps    tubulovillous adenoma, 2010  . External hemorrhoids without mention of complication   . Headache    eval with neuro in  2011  . Hyperlipemia   . Hyperparathyroidism [facility])    seeing Dr. Dwyane Dee in endocrinology  . Hypertension   . IBS (irritable bowel syndrome), GERD, hx colon polyps, chronic abd pain - followed by Dr. Sharlett Iles in GI 06/11/2012  . Osteoporosis 08/29/2011  . Vertigo    eval with neuro in 2011, brief recurrence 2016    Past Surgical History:  Procedure Laterality Date  . BACK SURGERY  1974  . CARPAL TUNNEL RELEASE  2/10  . TONSILLECTOMY      Family History  Problem Relation Age of Onset  . Diabetes Brother   . Hypertension Brother   . Hypertension Father   . Stroke Father   . Diabetes Sister   . Hypertension Sister   . Heart disease Sister   . CAD Mother        Died of MI at age 57  . CAD Brother        Died of MI at age 57  . Colon cancer Neg Hx   . Hypercalcemia Neg Hx     Social History   Social History  . Marital status: Widowed    Spouse name: N/A  . Number of children: 2  . Years of education: N/A   Occupational History  . retired   .  Retired   Social History Main Topics  . Smoking status: Never Smoker  . Smokeless tobacco: Never Used  . Alcohol use No  . Drug use: No  . Sexual activity: Not on file   Other Topics Concern  . Not on file   Social  History Narrative   Lives alone in a one story home.  Has 2 children.  Retired from working for a bank.  Education: high school.      Current Outpatient Prescriptions:  .  amLODipine (NORVASC) 5 MG tablet, Take 1 tablet (5 mg total) by mouth daily., Disp: 90 tablet, Rfl: 3 .  Bisacodyl (DULCOLAX PO), Take 5 mg by mouth daily as needed (constipation). As needed, Disp: , Rfl:  .  esomeprazole (NEXIUM) 40 MG capsule, Take 1 capsule (40 mg total) by mouth daily at 12 noon. (Patient taking differently: Take 40 mg by mouth daily as needed. ), Disp: 30 capsule, Rfl: 11 .  ezetimibe (ZETIA) 10 MG tablet, TAKE 1 BY MOUTH DAILY, Disp: 90 tablet, Rfl: 3 .  hydrocortisone (ANUSOL-HC) 2.5 % rectal cream, Apply to the  rectal area 2-3 times daily as needed., Disp: 30 g, Rfl: 1 .  lisinopril (PRINIVIL,ZESTRIL) 40 MG tablet, Take 1 tablet (40 mg total) by mouth daily., Disp: 90 tablet, Rfl: 3 .  rosuvastatin (CRESTOR) 5 MG tablet, Take 1/2 tablet by mouth daily., Disp: 45 tablet, Rfl: 3  EXAM:  Vitals:   02/13/17 1000  BP: 140/80  Pulse: 78  SpO2: 95%    Body mass index is 27.98 kg/m.  GENERAL: vitals reviewed and listed above, alert, oriented, appears well hydrated and in no acute distress  HEENT: atraumatic, conjunttiva clear, no obvious abnormalities on inspection of external nose and ears  NECK: no obvious masses on inspection  LUNGS: clear to auscultation bilaterally, no wheezes, rales or rhonchi, good air movement  CV: HRRR, no peripheral edema  ABD: BS+, soft, NTTP  MS: moves all extremities without noticeable abnormality  PSYCH: pleasant and cooperative, no obvious depression or anxiety  ASSESSMENT AND PLAN:  Discussed the following assessment and plan:  Need for immunization against influenza - Plan: Flu vaccine HIGH DOSE PF (Fluzone High dose)  Gastroesophageal reflux disease, esophagitis presence not specified  Constipation, unspecified constipation type  Essential hypertension - Plan: Basic metabolic panel, CBC  HYPERCHOLESTEROLEMIA - Plan: Lipid panel  Hyperparathyroidism ([place])  -labs per orders -bowel regimen for constipation and she is scheduled to see gi - offered labs for this as well but she reports this is chronic with extensive eval in the past for h. Pylori, celiacs, etc -I think baby asa would be ok, but she refuses, lifestyle recs for reduction CV risks -saw Epifania Gore for preventive care today -Patient advised to return or notify a doctor immediately if symptoms worsen or persist or new concerns arise.  Patient Instructions   BEFORE YOU LEAVE: -? Did susan do flu shot -does she want bone density test? I think she saw endocrinologist for this -labs -  set up fasting lab visit in next few weeks -follow up: 3-4 months  For the constipation: -fiber supplement in water every morning before breakfast (citracel) -mirilax in water 1-2 times daily for 3-10 days until cleaned out, then as needed   Ms. Damico , Thank you for taking time to come for your Medicare Wellness Visit. I appreciate your ongoing commitment to your health goals. Please review the following plan we discussed and let me know if I can assist you in the future.    Will get you flu vaccine today   Shingrix is a vaccine for the prevention of Shingles in Adults 50 and older.  If you are on Medicare, you can request a prescription from your doctor to be filled at a pharmacy.  Please check with your benefits regarding applicable copays or out of pocket expenses.  The Shingrix is given in 2 vaccines approx 8 weeks apart. You must receive the 2nd dose prior to 6 months from receipt of the first.   These are the goals we discussed: Goals    . Exercise 150 minutes per week (moderate activity)          3 days- 40 minutes  May call the community center to see if they have a walking program and can designate a safe time to walk        This is a list of the screening recommended for you and due dates:  Health Maintenance  Topic Date Due  . Flu Shot  12/13/2016  . Tetanus Vaccine  07/24/2022  . DEXA scan (bone density measurement)  Completed  . Pneumonia vaccines  Completed   Prevention of falls: Remove rugs or any tripping hazards in the home Use Non slip mats in bathtubs and showers Placing grab bars next to the toilet and or shower Placing handrails on both sides of the stair way Adding extra lighting in the home.   Personal safety issues reviewed:  1. Consider starting a community watch program per [facility] 2.  Changes batteries is smoke detector and/or carbon monoxide detector  3.  If you have firearms; keep them in a safe place 4.  Wear protection  when in the sun; Always wear sunscreen or a hat; It is good to have your doctor check your skin annually or review any new areas of concern 5. Driving safety; Keep in the right lane; stay 3 car lengths behind the car in front of you on the highway; look 3 times prior to pulling out; carry your cell phone everywhere you go!    Learn about the Yellow Dot program:  The program allows first responders at your emergency to have access to who your physician is, as well as your medications and medical conditions.  Citizens requesting the Yellow Dot Packages should contact Master Corporal Nunzio Cobbs at the [facility] [phone] for the first week of the program and beginning the week after Easter citizens should contact their Scientist, physiological.   Fall Prevention in the Home Falls can cause injuries and can affect people from all age groups. There are many simple things that you can do to make your home safe and to help prevent falls. What can I do on the outside of my home?  Regularly repair the edges of walkways and driveways and fix any cracks.  Remove high doorway thresholds.  Trim any shrubbery on the main path into your home.  Use bright outdoor lighting.  Clear walkways of debris and clutter, including tools and rocks.  Regularly check that handrails are securely fastened and in good repair. Both sides of any steps should have handrails.  Install guardrails along the edges of any raised decks or porches.  Have leaves, snow, and ice cleared regularly.  Use sand or salt on walkways during winter months.  In the garage, clean up any spills right away, including grease or oil spills. What can I do in the bathroom?  Use night lights.  Install grab bars by the toilet and in the tub and shower. Do not use towel bars as grab bars.  Use non-skid mats or decals on the floor of the tub or shower.  If you need to sit down while you are in the shower,  use a plastic, non-slip stool.  Keep the floor dry. Immediately clean up any water that spills on the floor.  Remove soap buildup in the tub or shower on a regular basis.  Attach bath mats securely with double-sided non-slip rug tape.  Remove throw rugs and other tripping hazards from the floor. What can I do in the bedroom?  Use night lights.  Make sure that a bedside light is easy to reach.  Do not use oversized bedding that drapes onto the floor.  Have a firm chair that has side arms to use for getting dressed.  Remove throw rugs and other tripping hazards from the floor. What can I do in the kitchen?  Clean up any spills right away.  Avoid walking on wet floors.  Place frequently used items in easy-to-reach places.  If you need to reach for something above you, use a sturdy step stool that has a grab bar.  Keep electrical cables out of the way.  Do not use floor polish or wax that makes floors slippery. If you have to use wax, make sure that it is non-skid floor wax.  Remove throw rugs and other tripping hazards from the floor. What can I do in the stairways?  Do not leave any items on the stairs.  Make sure that there are handrails on both sides of the stairs. Fix handrails that are broken or loose. Make sure that handrails are as long as the stairways.  Check any carpeting to make sure that it is firmly attached to the stairs. Fix any carpet that is loose or worn.  Avoid having throw rugs at the top or bottom of stairways, or secure the rugs with carpet tape to prevent them from moving.  Make sure that you have a light switch at the top of the stairs and the bottom of the stairs. If you do not have them, have them installed. What are some other fall prevention tips?  Wear closed-toe shoes that fit well and support your feet. Wear shoes that have rubber soles or low heels.  When you use a stepladder, make sure that it is completely opened and that the sides are  firmly locked. Have someone hold the ladder while you are using it. Do not climb a closed stepladder.  Add color or contrast paint or tape to grab bars and handrails in your home. Place contrasting color strips on the first and last steps.  Use mobility aids as needed, such as canes, walkers, scooters, and crutches.  Turn on lights if it is dark. Replace any light bulbs that burn out.  Set up furniture so that there are clear paths. Keep the furniture in the same spot.  Fix any uneven floor surfaces.  Choose a carpet design that does not hide the edge of steps of a stairway.  Be aware of any and all pets.  Review your medicines with your healthcare provider. Some medicines can cause dizziness or changes in blood pressure, which increase your risk of falling. Talk with your health care provider about other ways that you can decrease your risk of falls. This may include working with a physical therapist or trainer to improve your strength, balance, and endurance. This information is not intended to replace advice given to you by your health care provider. Make sure you discuss any questions you have with your health care provider. Document Released: 04/21/2002 Document Revised: 09/28/2015 Document Reviewed: 06/05/2014 Elsevier Interactive Patient Education  2017 [place]  Maintenance for Postmenopausal Women Menopause is a normal process in which your reproductive ability comes to an end. This process happens gradually over a span of months to years, usually between the ages of 55 and 2. Menopause is complete when you have missed 12 consecutive menstrual periods. It is important to talk with your health care provider about some of the most common conditions that affect postmenopausal women, such as heart disease, cancer, and bone loss (osteoporosis). Adopting a healthy lifestyle and getting preventive care can help to promote your health and wellness. Those actions can also  lower your chances of developing some of these common conditions. What should I know about menopause? During menopause, you may experience a number of symptoms, such as:  Moderate-to-severe hot flashes.  Night sweats.  Decrease in sex drive.  Mood swings.  Headaches.  Tiredness.  Irritability.  Memory problems.  Insomnia.  Choosing to treat or not to treat menopausal changes is an individual decision that you make with your health care provider. What should I know about hormone replacement therapy and supplements? Hormone therapy products are effective for treating symptoms that are associated with menopause, such as hot flashes and night sweats. Hormone replacement carries certain risks, especially as you become older. If you are thinking about using estrogen or estrogen with progestin treatments, discuss the benefits and risks with your health care provider. What should I know about heart disease and stroke? Heart disease, heart attack, and stroke become more likely as you age. This may be due, in part, to the hormonal changes that your body experiences during menopause. These can affect how your body processes dietary fats, triglycerides, and cholesterol. Heart attack and stroke are both medical emergencies. There are many things that you can do to help prevent heart disease and stroke:  Have your blood pressure checked at least every 1-2 years. High blood pressure causes heart disease and increases the risk of stroke.  If you are 57-58 years old, ask your health care provider if you should take aspirin to prevent a heart attack or a stroke.  Do not use any tobacco products, including cigarettes, chewing tobacco, or electronic cigarettes. If you need help quitting, ask your health care provider.  It is important to eat a healthy diet and maintain a healthy weight. ? Be sure to include plenty of vegetables, fruits, low-fat dairy products, and lean protein. ? Avoid eating foods  that are high in solid fats, added sugars, or salt (sodium).  Get regular exercise. This is one of the most important things that you can do for your health. ? Try to exercise for at least 150 minutes each week. The type of exercise that you do should increase your heart rate and make you sweat. This is known as moderate-intensity exercise. ? Try to do strengthening exercises at least twice each week. Do these in addition to the moderate-intensity exercise.  Know your numbers.Ask your health care provider to check your cholesterol and your blood glucose. Continue to have your blood tested as directed by your health care provider.  What should I know about cancer screening? There are several types of cancer. Take the following steps to reduce your risk and to catch any cancer development as early as possible. Breast Cancer  Practice breast self-awareness. ? This means understanding how your breasts normally appear and feel. ? It also means doing regular breast self-exams. Let your health care provider know about any changes, no matter how small.  If you are 40  or older, have a clinician do a breast exam (clinical breast exam or CBE) every year. Depending on your age, family history, and medical history, it may be recommended that you also have a yearly breast X-ray (mammogram).  If you have a family history of breast cancer, talk with your health care provider about genetic screening.  If you are at high risk for breast cancer, talk with your health care provider about having an MRI and a mammogram every year.  Breast cancer (BRCA) gene test is recommended for women who have family members with BRCA-related cancers. Results of the assessment will determine the need for genetic counseling and BRCA1 and for BRCA2 testing. BRCA-related cancers include these types: ? Breast. This occurs in males or females. ? Ovarian. ? Tubal. This may also be called fallopian tube cancer. ? Cancer of the  abdominal or pelvic lining (peritoneal cancer). ? Prostate. ? Pancreatic.  Cervical, Uterine, and Ovarian Cancer Your health care provider may recommend that you be screened regularly for cancer of the pelvic organs. These include your ovaries, uterus, and vagina. This screening involves a pelvic exam, which includes checking for microscopic changes to the surface of your cervix (Pap test).  For women ages 21-65, health care providers may recommend a pelvic exam and a Pap test every three years. For women ages 68-65, they may recommend the Pap test and pelvic exam, combined with testing for human papilloma virus (HPV), every five years. Some types of HPV increase your risk of cervical cancer. Testing for HPV may also be done on women of any age who have unclear Pap test results.  Other health care providers may not recommend any screening for nonpregnant women who are considered low risk for pelvic cancer and have no symptoms. Ask your health care provider if a screening pelvic exam is right for you.  If you have had past treatment for cervical cancer or a condition that could lead to cancer, you need Pap tests and screening for cancer for at least 20 years after your treatment. If Pap tests have been discontinued for you, your risk factors (such as having a new sexual partner) need to be reassessed to determine if you should start having screenings again. Some women have medical problems that increase the chance of getting cervical cancer. In these cases, your health care provider may recommend that you have screening and Pap tests more often.  If you have a family history of uterine cancer or ovarian cancer, talk with your health care provider about genetic screening.  If you have vaginal bleeding after reaching menopause, tell your health care provider.  There are currently no reliable tests available to screen for ovarian cancer.  Lung Cancer Lung cancer screening is recommended for adults  25-34 years old who are at high risk for lung cancer because of a history of smoking. A yearly low-dose CT scan of the lungs is recommended if you:  Currently smoke.  Have a history of at least 30 pack-years of smoking and you currently smoke or have quit within the past 15 years. A pack-year is smoking an average of one pack of cigarettes per day for one year.  Yearly screening should:  Continue until it has been 15 years since you quit.  Stop if you develop a health problem that would prevent you from having lung cancer treatment.  Colorectal Cancer  This type of cancer can be detected and can often be prevented.  Routine colorectal cancer screening usually begins at  age 34 and continues through age 53.  If you have risk factors for colon cancer, your health care provider may recommend that you be screened at an earlier age.  If you have a family history of colorectal cancer, talk with your health care provider about genetic screening.  Your health care provider may also recommend using home test kits to check for hidden blood in your stool.  A small camera at the end of a tube can be used to examine your colon directly (sigmoidoscopy or colonoscopy). This is done to check for the earliest forms of colorectal cancer.  Direct examination of the colon should be repeated every 5-10 years until age 56. However, if early forms of precancerous polyps or small growths are found or if you have a family history or genetic risk for colorectal cancer, you may need to be screened more often.  Skin Cancer  Check your skin from head to toe regularly.  Monitor any moles. Be sure to tell your health care provider: ? About any new moles or changes in moles, especially if there is a change in a mole's shape or color. ? If you have a mole that is larger than the size of a pencil eraser.  If any of your family members has a history of skin cancer, especially at a young age, talk with your health  care provider about genetic screening.  Always use sunscreen. Apply sunscreen liberally and repeatedly throughout the day.  Whenever you are outside, protect yourself by wearing long sleeves, pants, a wide-brimmed hat, and sunglasses.  What should I know about osteoporosis? Osteoporosis is a condition in which bone destruction happens more quickly than new bone creation. After menopause, you may be at an increased risk for osteoporosis. To help prevent osteoporosis or the bone fractures that can happen because of osteoporosis, the following is recommended:  If you are 61-71 years old, get at least 1,000 mg of calcium and at least 600 mg of vitamin D per day.  If you are older than age 44 but younger than age 91, get at least 1,200 mg of calcium and at least 600 mg of vitamin D per day.  If you are older than age 31, get at least 1,200 mg of calcium and at least 800 mg of vitamin D per day.  Smoking and excessive alcohol intake increase the risk of osteoporosis. Eat foods that are rich in calcium and vitamin D, and do weight-bearing exercises several times each week as directed by your health care provider. What should I know about how menopause affects my mental health? Depression may occur at any age, but it is more common as you become older. Common symptoms of depression include:  Low or sad mood.  Changes in sleep patterns.  Changes in appetite or eating patterns.  Feeling an overall lack of motivation or enjoyment of activities that you previously enjoyed.  Frequent crying spells.  Talk with your health care provider if you think that you are experiencing depression. What should I know about immunizations? It is important that you get and maintain your immunizations. These include:  Tetanus, diphtheria, and pertussis (Tdap) booster vaccine.  Influenza every year before the flu season begins.  Pneumonia vaccine.  Shingles vaccine.  Your health care provider may also  recommend other immunizations. This information is not intended to replace advice given to you by your health care provider. Make sure you discuss any questions you have with your health care provider. Document Released: 06/23/2005  Document Revised: 11/19/2015 Document Reviewed: 02/02/2015 Elsevier Interactive Patient Education  2018 [place]

## 2017-02-13 ENCOUNTER — Ambulatory Visit (INDEPENDENT_AMBULATORY_CARE_PROVIDER_SITE_OTHER): Payer: Medicare Other | Admitting: Family Medicine

## 2017-02-13 ENCOUNTER — Encounter: Payer: Self-pay | Admitting: Family Medicine

## 2017-02-13 VITALS — BP 140/80 | HR 78 | Ht 62.0 in | Wt 153.0 lb

## 2017-02-13 DIAGNOSIS — K5900 Constipation, unspecified: Secondary | ICD-10-CM | POA: Diagnosis not present

## 2017-02-13 DIAGNOSIS — K219 Gastro-esophageal reflux disease without esophagitis: Secondary | ICD-10-CM

## 2017-02-13 DIAGNOSIS — I10 Essential (primary) hypertension: Secondary | ICD-10-CM

## 2017-02-13 DIAGNOSIS — E7800 Pure hypercholesterolemia, unspecified: Secondary | ICD-10-CM | POA: Diagnosis not present

## 2017-02-13 DIAGNOSIS — Z0000 Encounter for general adult medical examination without abnormal findings: Secondary | ICD-10-CM

## 2017-02-13 DIAGNOSIS — E213 Hyperparathyroidism, unspecified: Secondary | ICD-10-CM | POA: Diagnosis not present

## 2017-02-13 DIAGNOSIS — Z23 Encounter for immunization: Secondary | ICD-10-CM

## 2017-02-13 NOTE — Progress Notes (Addendum)
Subjective:   Robin [name] is a 83 y.o. female who presents for Medicare Annual (Subsequent) preventive examination.  The Patient was informed that the wellness visit is to identify future health risk and educate and initiate measures that can reduce risk for increased disease through the lifespan.    Annual Wellness Assessment  Reports health as fair Lives alone Home one level  Good neighbors  Does not do any yard work;  2 children; they are in the area [place]   Preventive Screening -Counseling & Management  Medicare Annual Preventive Care Visit - Subsequent Last OV was on 11/13/2016  Colonoscopy was June 2015 Mammogram was August 2018 DEXA was April 2016  No EtOH use No tobacco use  Describes Health as poor, fair, good or great? Ok Dizziness and now stomach issues Going this Thursday for echocardiogram  Dizziness has improved Apt with Dr. Fabienne Bruns Nov 8th  VS reviewed;   Diet  Can eat and then it hurts She is hungry but does not want anything No vomiting or diarrhea but sometimes has nausea  She has gained weight    BMI 28   Exercise Does not exercise Did walk but did love to walk; used to walk in am about a mile Does not have any where to walk  Lives in [place] she felt better when walking   Functionally about the same as last year      Advanced Directives declined   Patient Care Team: Lucretia Kern, DO as PCP - General (Family Medicine)     Cardiac Risk Factors include: advanced age (>69men, >13 women);dyslipidemia;family history of premature cardiovascular disease;hypertension     Objective:     Vitals: BP 140/80   Pulse 78   Ht 5\' 2"  (1.575 m)   Wt 153 lb (69.4 kg)   SpO2 95%   BMI 27.98 kg/m   Body mass index is 27.98 kg/m.   Tobacco History  Smoking Status  . Never Smoker  Smokeless Tobacco  . Never Used     Counseling given: Yes   Past Medical  History:  Diagnosis Date  . Asthma   . Colon polyps    tubulovillous adenoma, 2010  . External hemorrhoids without mention of complication   . Headache    eval with neuro in 2011  . Hyperlipemia   . Hyperparathyroidism [facility])    seeing Dr. Dwyane Dee in endocrinology  . Hypertension   . IBS (irritable bowel syndrome), GERD, hx colon polyps, chronic abd pain - followed by Dr. Sharlett Iles in GI 06/11/2012  . Osteoporosis 08/29/2011  . Vertigo    eval with neuro in 2011, brief recurrence 2016   Past Surgical History:  Procedure Laterality Date  . BACK SURGERY  1974  . CARPAL TUNNEL RELEASE  2/10  . TONSILLECTOMY     Family History  Problem Relation Age of Onset  . Diabetes Brother   . Hypertension Brother   . Hypertension Father   . Stroke Father   . Diabetes Sister   . Hypertension Sister   . Heart disease Sister   . CAD Mother        Died of MI at age 71  . CAD Brother        Died of MI at age 95  . Colon cancer Neg Hx   . Hypercalcemia Neg Hx    History  Sexual Activity  . Sexual activity: Not on file  Outpatient Encounter Prescriptions as of 02/13/2017  Medication Sig  . amLODipine (NORVASC) 5 MG tablet Take 1 tablet (5 mg total) by mouth daily.  . Bisacodyl (DULCOLAX PO) Take 5 mg by mouth daily as needed (constipation). As needed  . esomeprazole (NEXIUM) 40 MG capsule Take 1 capsule (40 mg total) by mouth daily at 12 noon. (Patient taking differently: Take 40 mg by mouth daily as needed. )  . ezetimibe (ZETIA) 10 MG tablet TAKE 1 BY MOUTH DAILY  . hydrocortisone (ANUSOL-HC) 2.5 % rectal cream Apply to the rectal area 2-3 times daily as needed.  Marland Kitchen lisinopril (PRINIVIL,ZESTRIL) 40 MG tablet Take 1 tablet (40 mg total) by mouth daily.  . rosuvastatin (CRESTOR) 5 MG tablet Take 1/2 tablet by mouth daily.   No facility-administered encounter medications on file as of 02/13/2017.     Activities of Daily Living In your present state of health, do you have any difficulty  performing the following activities: 02/13/2017  Hearing? N  Vision? N  Difficulty concentrating or making decisions? N  Walking or climbing stairs? N  Dressing or bathing? N  Doing errands, shopping? N  Preparing Food and eating ? N  Using the Toilet? N  In the past six months, have you accidently leaked urine? N  Do you have problems with loss of bowel control? N  Managing your Medications? N  Managing your Finances? N  Housekeeping or managing your Housekeeping? N  Some recent data might be hidden    Patient Care Team: Lucretia Kern, DO as PCP - General (Family Medicine)    Assessment:     Exercise Activities and Dietary recommendations Current Exercise Habits: Home exercise routine, Time (Minutes): 30, Frequency (Times/Week): 3, Weekly Exercise (Minutes/Week): 90, Intensity: Mild  Goals    . Exercise 150 minutes per week (moderate activity)          3 days- 40 minutes  May call the community center to see if they have a walking program and can designate a safe time to walk       Fall Risk Fall Risk  02/13/2017 12/15/2016 12/06/2015 09/17/2015 08/10/2014  Falls in the past year? No No - No No  Number falls in past yr: - - 1 - -  Injury with Fall? - - No - -   Depression Screen PHQ 2/9 Scores 02/13/2017 12/06/2015 08/10/2014 07/07/2014  PHQ - 2 Score 0 0 0 0     Cognitive Function MMSE - Mini Mental State Exam 02/13/2017  Not completed: (No Data)   Ad8 score reviewed for issues:  Issues making decisions:  Less interest in hobbies / activities:  Repeats questions, stories (family complaining):  Trouble using ordinary gadgets (microwave, computer, phone):  Forgets the month or year:   Mismanaging finances:   Remembering appts:  Daily problems with thinking and/or memory: Ad8 score is=0          Immunization History  Administered Date(s) Administered  . Influenza, High Dose Seasonal PF 03/23/2015, 02/22/2016  . Influenza,inj,Quad PF,6+ Mos 03/07/2013,  03/03/2014  . Pneumococcal Conjugate-13 08/10/2014  . Pneumococcal Polysaccharide-23 07/23/2012  . Tdap 07/23/2012  . Zoster 09/30/2013   Screening Tests Health Maintenance  Topic Date Due  . INFLUENZA VACCINE  12/13/2016  . TETANUS/TDAP  07/24/2022  . DEXA SCAN  Completed  . PNA vac Low Risk Adult  Completed      Plan:     PCP Notes   Health Maintenance Flu vaccine given today Educated regarding shingrix  Referrals  No referrals today  Patient concerns; Patient complains of abdominal pain after eating. To discuss with Dr. Maudie Mercury today.  Nurse Concerns; As Noted  Next PCP apt Seeing Dr. Maudie Mercury today.      I have personally reviewed and noted the following in the patient's chart:   . Medical and social history . Use of alcohol, tobacco or illicit drugs  . Current medications and supplements . Functional ability and status . Nutritional status . Physical activity . Advanced directives . List of other physicians . Hospitalizations, surgeries, and ER visits in previous 12 months . Vitals . Screenings to include cognitive, depression, and falls . Referrals and appointments  In addition, I have reviewed and discussed with patient certain preventive protocols, quality metrics, and best practice recommendations. A written personalized care plan for preventive services as well as general preventive health recommendations were provided to patient.     MOQHU,TMLYY, RN  02/13/2017  Lucretia Kern., DO

## 2017-02-13 NOTE — Patient Instructions (Addendum)
BEFORE YOU LEAVE: -? Did Dulcemaria Bula do flu shot -does she want bone density test? I think she saw endocrinologist for this -labs - set up fasting lab visit in next few weeks -follow up: 3-4 months  For the constipation: -fiber supplement in water every morning before breakfast (citracel) -mirilax in water 1-2 times daily for 3-10 days until cleaned out, then as needed   Ms. Robin [name] , Thank you for taking time to come for your Medicare Wellness Visit. I appreciate your ongoing commitment to your health goals. Please review the following plan we discussed and let me know if I can assist you in the future.    Will get you flu vaccine today   Shingrix is a vaccine for the prevention of Shingles in Adults 50 and older.  If you are on Medicare, you can request a prescription from your doctor to be filled at a pharmacy.  Please check with your benefits regarding applicable copays or out of pocket expenses.  The Shingrix is given in 2 vaccines approx 8 weeks apart. You must receive the 2nd dose prior to 6 months from receipt of the first.   These are the goals we discussed: Goals    . Exercise 150 minutes per week (moderate activity)          3 days- 40 minutes  May call the community center to see if they have a walking program and can designate a safe time to walk        This is a list of the screening recommended for you and due dates:  Health Maintenance  Topic Date Due  . Flu Shot  12/13/2016  . Tetanus Vaccine  07/24/2022  . DEXA scan (bone density measurement)  Completed  . Pneumonia vaccines  Completed   Prevention of falls: Remove rugs or any tripping hazards in the home Use Non slip mats in bathtubs and showers Placing grab bars next to the toilet and or shower Placing handrails on both sides of the stair way Adding extra lighting in the home.   Personal safety issues reviewed:  1. Consider starting a community watch program per [facility] 2.  Changes batteries  is smoke detector and/or carbon monoxide detector  3.  If you have firearms; keep them in a safe place 4.  Wear protection when in the sun; Always wear sunscreen or a hat; It is good to have your doctor check your skin annually or review any new areas of concern 5. Driving safety; Keep in the right lane; stay 3 car lengths behind the car in front of you on the highway; look 3 times prior to pulling out; carry your cell phone everywhere you go!    Learn about the Yellow Dot program:  The program allows first responders at your emergency to have access to who your physician is, as well as your medications and medical conditions.  Citizens requesting the Yellow Dot Packages should contact Master Corporal Nunzio Cobbs at the [facility] [phone] for the first week of the program and beginning the week after Easter citizens should contact their Scientist, physiological.   Fall Prevention in the Home Falls can cause injuries and can affect people from all age groups. There are many simple things that you can do to make your home safe and to help prevent falls. What can I do on the outside of my home?  Regularly repair the edges of walkways and driveways and fix any cracks.  Remove high doorway thresholds.  Trim any shrubbery on the main path into your home.  Use bright outdoor lighting.  Clear walkways of debris and clutter, including tools and rocks.  Regularly check that handrails are securely fastened and in good repair. Both sides of any steps should have handrails.  Install guardrails along the edges of any raised decks or porches.  Have leaves, snow, and ice cleared regularly.  Use sand or salt on walkways during winter months.  In the garage, clean up any spills right away, including grease or oil spills. What can I do in the bathroom?  Use night lights.  Install grab bars by the toilet and in the tub and shower. Do not use towel bars as grab  bars.  Use non-skid mats or decals on the floor of the tub or shower.  If you need to sit down while you are in the shower, use a plastic, non-slip stool.  Keep the floor dry. Immediately clean up any water that spills on the floor.  Remove soap buildup in the tub or shower on a regular basis.  Attach bath mats securely with double-sided non-slip rug tape.  Remove throw rugs and other tripping hazards from the floor. What can I do in the bedroom?  Use night lights.  Make sure that a bedside light is easy to reach.  Do not use oversized bedding that drapes onto the floor.  Have a firm chair that has side arms to use for getting dressed.  Remove throw rugs and other tripping hazards from the floor. What can I do in the kitchen?  Clean up any spills right away.  Avoid walking on wet floors.  Place frequently used items in easy-to-reach places.  If you need to reach for something above you, use a sturdy step stool that has a grab bar.  Keep electrical cables out of the way.  Do not use floor polish or wax that makes floors slippery. If you have to use wax, make sure that it is non-skid floor wax.  Remove throw rugs and other tripping hazards from the floor. What can I do in the stairways?  Do not leave any items on the stairs.  Make sure that there are handrails on both sides of the stairs. Fix handrails that are broken or loose. Make sure that handrails are as long as the stairways.  Check any carpeting to make sure that it is firmly attached to the stairs. Fix any carpet that is loose or worn.  Avoid having throw rugs at the top or bottom of stairways, or secure the rugs with carpet tape to prevent them from moving.  Make sure that you have a light switch at the top of the stairs and the bottom of the stairs. If you do not have them, have them installed. What are some other fall prevention tips?  Wear closed-toe shoes that fit well and support your feet. Wear shoes  that have rubber soles or low heels.  When you use a stepladder, make sure that it is completely opened and that the sides are firmly locked. Have someone hold the ladder while you are using it. Do not climb a closed stepladder.  Add color or contrast paint or tape to grab bars and handrails in your home. Place contrasting color strips on the first and last steps.  Use mobility aids as needed, such as canes, walkers, scooters, and crutches.  Turn on lights if it is dark. Replace any light bulbs that  burn out.  Set up furniture so that there are clear paths. Keep the furniture in the same spot.  Fix any uneven floor surfaces.  Choose a carpet design that does not hide the edge of steps of a stairway.  Be aware of any and all pets.  Review your medicines with your healthcare provider. Some medicines can cause dizziness or changes in blood pressure, which increase your risk of falling. Talk with your health care provider about other ways that you can decrease your risk of falls. This may include working with a physical therapist or trainer to improve your strength, balance, and endurance. This information is not intended to replace advice given to you by your health care provider. Make sure you discuss any questions you have with your health care provider. Document Released: 04/21/2002 Document Revised: 09/28/2015 Document Reviewed: 06/05/2014 Elsevier Interactive Patient Education  2017 [place] Maintenance for Postmenopausal Women Menopause is a normal process in which your reproductive ability comes to an end. This process happens gradually over a span of months to years, usually between the ages of 78 and 18. Menopause is complete when you have missed 12 consecutive menstrual periods. It is important to talk with your health care provider about some of the most common conditions that affect postmenopausal women, such as heart disease, cancer, and bone loss (osteoporosis).  Adopting a healthy lifestyle and getting preventive care can help to promote your health and wellness. Those actions can also lower your chances of developing some of these common conditions. What should I know about menopause? During menopause, you may experience a number of symptoms, such as:  Moderate-to-severe hot flashes.  Night sweats.  Decrease in sex drive.  Mood swings.  Headaches.  Tiredness.  Irritability.  Memory problems.  Insomnia.  Choosing to treat or not to treat menopausal changes is an individual decision that you make with your health care provider. What should I know about hormone replacement therapy and supplements? Hormone therapy products are effective for treating symptoms that are associated with menopause, such as hot flashes and night sweats. Hormone replacement carries certain risks, especially as you become older. If you are thinking about using estrogen or estrogen with progestin treatments, discuss the benefits and risks with your health care provider. What should I know about heart disease and stroke? Heart disease, heart attack, and stroke become more likely as you age. This may be due, in part, to the hormonal changes that your body experiences during menopause. These can affect how your body processes dietary fats, triglycerides, and cholesterol. Heart attack and stroke are both medical emergencies. There are many things that you can do to help prevent heart disease and stroke:  Have your blood pressure checked at least every 1-2 years. High blood pressure causes heart disease and increases the risk of stroke.  If you are 56-40 years old, ask your health care provider if you should take aspirin to prevent a heart attack or a stroke.  Do not use any tobacco products, including cigarettes, chewing tobacco, or electronic cigarettes. If you need help quitting, ask your health care provider.  It is important to eat a healthy diet and maintain a  healthy weight. ? Be sure to include plenty of vegetables, fruits, low-fat dairy products, and lean protein. ? Avoid eating foods that are high in solid fats, added sugars, or salt (sodium).  Get regular exercise. This is one of the most important things that you can do for your health. ?  Try to exercise for at least 150 minutes each week. The type of exercise that you do should increase your heart rate and make you sweat. This is known as moderate-intensity exercise. ? Try to do strengthening exercises at least twice each week. Do these in addition to the moderate-intensity exercise.  Know your numbers.Ask your health care provider to check your cholesterol and your blood glucose. Continue to have your blood tested as directed by your health care provider.  What should I know about cancer screening? There are several types of cancer. Take the following steps to reduce your risk and to catch any cancer development as early as possible. Breast Cancer  Practice breast self-awareness. ? This means understanding how your breasts normally appear and feel. ? It also means doing regular breast self-exams. Let your health care provider know about any changes, no matter how small.  If you are 28 or older, have a clinician do a breast exam (clinical breast exam or CBE) every year. Depending on your age, family history, and medical history, it may be recommended that you also have a yearly breast X-ray (mammogram).  If you have a family history of breast cancer, talk with your health care provider about genetic screening.  If you are at high risk for breast cancer, talk with your health care provider about having an MRI and a mammogram every year.  Breast cancer (BRCA) gene test is recommended for women who have family members with BRCA-related cancers. Results of the assessment will determine the need for genetic counseling and BRCA1 and for BRCA2 testing. BRCA-related cancers include these  types: ? Breast. This occurs in males or females. ? Ovarian. ? Tubal. This may also be called fallopian tube cancer. ? Cancer of the abdominal or pelvic lining (peritoneal cancer). ? Prostate. ? Pancreatic.  Cervical, Uterine, and Ovarian Cancer Your health care provider may recommend that you be screened regularly for cancer of the pelvic organs. These include your ovaries, uterus, and vagina. This screening involves a pelvic exam, which includes checking for microscopic changes to the surface of your cervix (Pap test).  For women ages 21-65, health care providers may recommend a pelvic exam and a Pap test every three years. For women ages 64-65, they may recommend the Pap test and pelvic exam, combined with testing for human papilloma virus (HPV), every five years. Some types of HPV increase your risk of cervical cancer. Testing for HPV may also be done on women of any age who have unclear Pap test results.  Other health care providers may not recommend any screening for nonpregnant women who are considered low risk for pelvic cancer and have no symptoms. Ask your health care provider if a screening pelvic exam is right for you.  If you have had past treatment for cervical cancer or a condition that could lead to cancer, you need Pap tests and screening for cancer for at least 20 years after your treatment. If Pap tests have been discontinued for you, your risk factors (such as having a new sexual partner) need to be reassessed to determine if you should start having screenings again. Some women have medical problems that increase the chance of getting cervical cancer. In these cases, your health care provider may recommend that you have screening and Pap tests more often.  If you have a family history of uterine cancer or ovarian cancer, talk with your health care provider about genetic screening.  If you have vaginal bleeding after reaching menopause, tell  your health care provider.  There  are currently no reliable tests available to screen for ovarian cancer.  Lung Cancer Lung cancer screening is recommended for adults 12-76 years old who are at high risk for lung cancer because of a history of smoking. A yearly low-dose CT scan of the lungs is recommended if you:  Currently smoke.  Have a history of at least 30 pack-years of smoking and you currently smoke or have quit within the past 15 years. A pack-year is smoking an average of one pack of cigarettes per day for one year.  Yearly screening should:  Continue until it has been 15 years since you quit.  Stop if you develop a health problem that would prevent you from having lung cancer treatment.  Colorectal Cancer  This type of cancer can be detected and can often be prevented.  Routine colorectal cancer screening usually begins at age 45 and continues through age 52.  If you have risk factors for colon cancer, your health care provider may recommend that you be screened at an earlier age.  If you have a family history of colorectal cancer, talk with your health care provider about genetic screening.  Your health care provider may also recommend using home test kits to check for hidden blood in your stool.  A small camera at the end of a tube can be used to examine your colon directly (sigmoidoscopy or colonoscopy). This is done to check for the earliest forms of colorectal cancer.  Direct examination of the colon should be repeated every 5-10 years until age 69. However, if early forms of precancerous polyps or small growths are found or if you have a family history or genetic risk for colorectal cancer, you may need to be screened more often.  Skin Cancer  Check your skin from head to toe regularly.  Monitor any moles. Be sure to tell your health care provider: ? About any new moles or changes in moles, especially if there is a change in a mole's shape or color. ? If you have a mole that is larger than the  size of a pencil eraser.  If any of your family members has a history of skin cancer, especially at a young age, talk with your health care provider about genetic screening.  Always use sunscreen. Apply sunscreen liberally and repeatedly throughout the day.  Whenever you are outside, protect yourself by wearing long sleeves, pants, a wide-brimmed hat, and sunglasses.  What should I know about osteoporosis? Osteoporosis is a condition in which bone destruction happens more quickly than new bone creation. After menopause, you may be at an increased risk for osteoporosis. To help prevent osteoporosis or the bone fractures that can happen because of osteoporosis, the following is recommended:  If you are 65-103 years old, get at least 1,000 mg of calcium and at least 600 mg of vitamin D per day.  If you are older than age 74 but younger than age 105, get at least 1,200 mg of calcium and at least 600 mg of vitamin D per day.  If you are older than age 35, get at least 1,200 mg of calcium and at least 800 mg of vitamin D per day.  Smoking and excessive alcohol intake increase the risk of osteoporosis. Eat foods that are rich in calcium and vitamin D, and do weight-bearing exercises several times each week as directed by your health care provider. What should I know about how menopause affects my mental health?  Depression may occur at any age, but it is more common as you become older. Common symptoms of depression include:  Low or sad mood.  Changes in sleep patterns.  Changes in appetite or eating patterns.  Feeling an overall lack of motivation or enjoyment of activities that you previously enjoyed.  Frequent crying spells.  Talk with your health care provider if you think that you are experiencing depression. What should I know about immunizations? It is important that you get and maintain your immunizations. These include:  Tetanus, diphtheria, and pertussis (Tdap) booster  vaccine.  Influenza every year before the flu season begins.  Pneumonia vaccine.  Shingles vaccine.  Your health care provider may also recommend other immunizations. This information is not intended to replace advice given to you by your health care provider. Make sure you discuss any questions you have with your health care provider. Document Released: 06/23/2005 Document Revised: 11/19/2015 Document Reviewed: 02/02/2015 Elsevier Interactive Patient Education  2018 [employer].

## 2017-02-15 ENCOUNTER — Other Ambulatory Visit: Payer: Self-pay

## 2017-02-15 ENCOUNTER — Ambulatory Visit (HOSPITAL_COMMUNITY): Payer: Medicare Other | Attending: Cardiology

## 2017-02-15 DIAGNOSIS — R55 Syncope and collapse: Secondary | ICD-10-CM

## 2017-02-15 DIAGNOSIS — I060 Rheumatic aortic stenosis: Secondary | ICD-10-CM | POA: Diagnosis not present

## 2017-02-15 DIAGNOSIS — I5030 Unspecified diastolic (congestive) heart failure: Secondary | ICD-10-CM | POA: Insufficient documentation

## 2017-02-16 ENCOUNTER — Other Ambulatory Visit (INDEPENDENT_AMBULATORY_CARE_PROVIDER_SITE_OTHER): Payer: Medicare Other

## 2017-02-16 DIAGNOSIS — I10 Essential (primary) hypertension: Secondary | ICD-10-CM | POA: Diagnosis not present

## 2017-02-16 DIAGNOSIS — E7800 Pure hypercholesterolemia, unspecified: Secondary | ICD-10-CM

## 2017-02-16 LAB — CBC
HEMATOCRIT: 41.1 % (ref 36.0–46.0)
Hemoglobin: 13.7 g/dL (ref 12.0–15.0)
MCHC: 33.4 g/dL (ref 30.0–36.0)
MCV: 94.8 fl (ref 78.0–100.0)
Platelets: 278 10*3/uL (ref 150.0–400.0)
RBC: 4.33 Mil/uL (ref 3.87–5.11)
RDW: 13 % (ref 11.5–15.5)
WBC: 5.7 10*3/uL (ref 4.0–10.5)

## 2017-02-16 LAB — LIPID PANEL
CHOLESTEROL: 177 mg/dL (ref 0–200)
HDL: 69.6 mg/dL (ref >39.00)
LDL CALC: 92 mg/dL (ref 0–99)
NonHDL: 107.27
TRIGLYCERIDES: 75 mg/dL (ref 0.0–149.0)
Total CHOL/HDL Ratio: 3
VLDL: 15 mg/dL (ref 0.0–40.0)

## 2017-02-16 LAB — BASIC METABOLIC PANEL
BUN: 17 mg/dL (ref 6–23)
CHLORIDE: 102 meq/L (ref 96–112)
CO2: 30 mEq/L (ref 19–32)
CREATININE: 0.75 mg/dL (ref 0.40–1.20)
Calcium: 11.2 mg/dL — ABNORMAL HIGH (ref 8.4–10.5)
GFR: 78.8 mL/min (ref >60.00)
Glucose, Bld: 89 mg/dL (ref 70–99)
Potassium: 4.2 mEq/L (ref 3.5–5.1)
Sodium: 140 mEq/L (ref 135–145)

## 2017-02-20 ENCOUNTER — Telehealth: Payer: Self-pay | Admitting: Family Medicine

## 2017-02-20 NOTE — Telephone Encounter (Signed)
° ° ° °  Pt would like a copy of her lab results mail to her

## 2017-02-20 NOTE — Telephone Encounter (Signed)
Results mailed 

## 2017-03-05 DIAGNOSIS — H40013 Open angle with borderline findings, low risk, bilateral: Secondary | ICD-10-CM | POA: Diagnosis not present

## 2017-03-05 DIAGNOSIS — H524 Presbyopia: Secondary | ICD-10-CM | POA: Diagnosis not present

## 2017-03-05 DIAGNOSIS — H25013 Cortical age-related cataract, bilateral: Secondary | ICD-10-CM | POA: Diagnosis not present

## 2017-03-05 DIAGNOSIS — H2513 Age-related nuclear cataract, bilateral: Secondary | ICD-10-CM | POA: Diagnosis not present

## 2017-03-19 ENCOUNTER — Encounter: Payer: Self-pay | Admitting: Endocrinology

## 2017-03-19 ENCOUNTER — Ambulatory Visit (INDEPENDENT_AMBULATORY_CARE_PROVIDER_SITE_OTHER): Payer: Medicare Other | Admitting: Endocrinology

## 2017-03-19 VITALS — BP 168/82 | HR 68 | Wt 152.6 lb

## 2017-03-19 DIAGNOSIS — E559 Vitamin D deficiency, unspecified: Secondary | ICD-10-CM

## 2017-03-19 LAB — VITAMIN D 25 HYDROXY (VIT D DEFICIENCY, FRACTURES): VITD: 20.03 ng/mL — AB (ref 30.00–100.00)

## 2017-03-19 MED ORDER — FUROSEMIDE 20 MG PO TABS: 10.0000 mg | ORAL_TABLET | Freq: Every day | ORAL | 11 refills | Status: DC

## 2017-03-19 NOTE — Progress Notes (Signed)
Subjective:    Patient ID: Robin [name], female    DOB: 08-16-1933, 83 y.o.   MRN: [id number]  HPI The state of at least three ongoing medical problems is addressed today, with interval history of each noted here: Pt returns for f/u of hyperparathyroidism (probably combined primary and secondary; first noted in 2009; she has never had urolithiasis, PUD, pancreatitis, depression, or bony fracture; DEXA in 2016 showed osteopenia).  Denies cramps. She was recently noted to have hypercalcemia. This is a stable problem.  Vitamin-D deficiency: she denies numbness.  She has not recently taken any vitamin-D products.  This is a stable problem. HTN: leg edema has recurred.  She says at home, BP is 662-947 systolic.  This is a stable problem.   Past Medical History:  Diagnosis Date  . Asthma   . Colon polyps    tubulovillous adenoma, 2010  . External hemorrhoids without mention of complication   . Headache    eval with neuro in 2011  . Hyperlipemia   . Hyperparathyroidism [facility])    seeing Dr. Dwyane Dee in endocrinology  . Hypertension   . IBS (irritable bowel syndrome), GERD, hx colon polyps, chronic abd pain - followed by Dr. Sharlett Iles in GI 06/11/2012  . Osteoporosis 08/29/2011  . Vertigo    eval with neuro in 2011, brief recurrence 2016    Past Surgical History:  Procedure Laterality Date  . BACK SURGERY  1974  . CARPAL TUNNEL RELEASE  2/10  . TONSILLECTOMY      Social History   Socioeconomic History  . Marital status: Widowed    Spouse name: Not on file  . Number of children: 2  . Years of education: Not on file  . Highest education level: Not on file  Social Needs  . Financial resource strain: Not on file  . Food insecurity - worry: Not on file  . Food insecurity - inability: Not on file  . Transportation needs - medical: Not on file  . Transportation needs - non-medical: Not on file  Occupational History  . Occupation: retired    Fish farm manager: RETIRED  Tobacco Use  . Smoking  status: Never Smoker  . Smokeless tobacco: Never Used  Substance and Sexual Activity  . Alcohol use: No    Alcohol/week: 0.0 oz  . Drug use: No  . Sexual activity: Not on file  Other Topics Concern  . Not on file  Social History Narrative   Lives alone in a one story home.  Has 2 children.  Retired from working for a bank.  Education: high school.     Current Outpatient Medications on File Prior to Visit  Medication Sig Dispense Refill  . amLODipine (NORVASC) 5 MG tablet Take 1 tablet (5 mg total) by mouth daily. 90 tablet 3  . Bisacodyl (DULCOLAX PO) Take 5 mg by mouth daily as needed (constipation). As needed    . esomeprazole (NEXIUM) 40 MG capsule Take 1 capsule (40 mg total) by mouth daily at 12 noon. (Patient taking differently: Take 40 mg by mouth daily as needed. ) 30 capsule 11  . ezetimibe (ZETIA) 10 MG tablet TAKE 1 BY MOUTH DAILY 90 tablet 3  . hydrocortisone (ANUSOL-HC) 2.5 % rectal cream Apply to the rectal area 2-3 times daily as needed. 30 g 1  . lisinopril (PRINIVIL,ZESTRIL) 40 MG tablet Take 1 tablet (40 mg total) by mouth daily. 90 tablet 3  . rosuvastatin (CRESTOR) 5 MG tablet Take 1/2 tablet by mouth daily. 45 tablet  3   No current facility-administered medications on file prior to visit.     Allergies  Allergen Reactions  . Prilosec [Omeprazole] Cough  . Amoxicillin     REACTION: rash  . Metronidazole     REACTION: rash  . Protonix [Pantoprazole Sodium] Swelling    Family History  Problem Relation Age of Onset  . Diabetes Brother   . Hypertension Brother   . Hypertension Father   . Stroke Father   . Diabetes Sister   . Hypertension Sister   . Heart disease Sister   . CAD Mother        Died of MI at age 15  . CAD Brother        Died of MI at age 91  . Colon cancer Neg Hx   . Hypercalcemia Neg Hx     BP (!) 168/82   Pulse 68   Wt 152 lb 9.6 oz (69.2 kg)   SpO2 96%   BMI 27.91 kg/m    Review of Systems Denies sob and falls.       Objective:   Physical Exam VITAL SIGNS:  See vs page.   GENERAL: no distress.  Ext: trace bilat leg edema.      Assessment & Plan:  HTN: she needs increased rx: edema is prob due to norvasc.   Primary hyperparathyroidism: she declines repeat DEXA.   Vit-D deficiency: due for recheck.    Patient Instructions  blood tests are requested for you today.  We'll let you know about the results. Unless the calcium goes over 12, you don't need surgery. I have sent a prescription to your pharmacy, for a fluid pill.  This will help the calcium and blood pressure.   Please come back for a follow-up appointment in 6 months.

## 2017-03-19 NOTE — Patient Instructions (Signed)
blood tests are requested for you today.  We'll let you know about the results. Unless the calcium goes over 12, you don't need surgery. I have sent a prescription to your pharmacy, for a fluid pill.  This will help the calcium and blood pressure.   Please come back for a follow-up appointment in 6 months.

## 2017-03-20 LAB — PTH, INTACT AND CALCIUM
CALCIUM: 10.4 mg/dL (ref 8.6–10.4)
PTH: 118 pg/mL — AB (ref 14–64)

## 2017-03-21 ENCOUNTER — Telehealth: Payer: Self-pay | Admitting: *Deleted

## 2017-03-21 NOTE — Telephone Encounter (Signed)
Patient called and states she was called and given her lab results. She has questions regarding those results. She is requesting a call back. Her contact number is [phone]. Please advise. Thank you

## 2017-03-21 NOTE — Telephone Encounter (Signed)
Called patient & I am sending her lab results VIA mail.

## 2017-03-22 ENCOUNTER — Ambulatory Visit (INDEPENDENT_AMBULATORY_CARE_PROVIDER_SITE_OTHER): Payer: Medicare Other | Admitting: Gastroenterology

## 2017-03-22 ENCOUNTER — Encounter: Payer: Self-pay | Admitting: Gastroenterology

## 2017-03-22 VITALS — BP 150/80 | HR 72 | Ht 62.0 in | Wt 153.8 lb

## 2017-03-22 DIAGNOSIS — R1084 Generalized abdominal pain: Secondary | ICD-10-CM

## 2017-03-22 DIAGNOSIS — R140 Abdominal distension (gaseous): Secondary | ICD-10-CM

## 2017-03-22 DIAGNOSIS — K5904 Chronic idiopathic constipation: Secondary | ICD-10-CM

## 2017-03-22 MED ORDER — PLECANATIDE 3 MG PO TABS: 1.0000 | ORAL_TABLET | Freq: Every day | ORAL | 11 refills | Status: DC

## 2017-03-22 MED ORDER — PEG-KCL-NACL-NASULF-NA ASC-C 140 G PO SOLR: 1.0000 | ORAL | 0 refills | Status: DC

## 2017-03-22 NOTE — Patient Instructions (Addendum)
Dr Fuller Plan recommends that you complete a bowel purge (to clean out your bowels). Please do the following: Remain on a clear liquid diet the rest of the day.  Please follow instructions on Plenvu box Steps 1-3. Wait 6 hours and repeat steps 1-3 as directed.   After you have finished your bowel purge, start Trulance daily and Miralax twice daily. This is to regulate your bowels so please take these medications every day, not as needed.   Thank you for choosing me and [facility].  Pricilla Riffle. Dagoberto Ligas., MD., Marval Regal

## 2017-03-22 NOTE — Progress Notes (Signed)
    History of Present Illness: This is an 83 year old with chronic constipation, generalized abdominal discomfort and abdominal bloating.  She has tried multiple over-the-counter laxatives as well as Linzess and Trulance without adequate results however she does not recall exactly what the problem was each medication. Her recall of past events is not complete. She senses incomplete evaluation.   Abd/pelvic CT 03/2016 Fairly large amount of fecal matter throughout the colon which could relate to the presenting symptoms. No evidence of obstructing lesion. Small bowel appears normal. Mild fatty change of the liver. 15 mm simple appearing cyst the left ovary. In the postmenopausal female, follow-up in 1 year of this likely benign finding is suggested. Calcified leiomyomas of the uterus, not likely significant. 1 mm nonobstructing stone lower pole right kidney. Aortic atherosclerosis.  Colonoscopy 10/2013: 1. Sessile polyp measuring 5 mm in the transverse colon; polypectomy performed with a cold snare 2. Mild diverticulosis in the sigmoid colon 3. Large internal hemorrhoids  Current Medications, Allergies, Past Medical History, Past Surgical History, Family History and Social History were reviewed in [employer] record.  Physical Exam: General: Well developed, well nourished, no acute distress Head: Normocephalic and atraumatic Eyes:  sclerae anicteric, EOMI Ears: Normal auditory acuity Mouth: No deformity or lesions Lungs: Clear throughout to auscultation Heart: Regular rate and rhythm; no murmurs, rubs or bruits Abdomen: Soft, non tender and non distended. No masses, hepatosplenomegaly or hernias noted. Normal Bowel sounds Musculoskeletal: Symmetrical with no gross deformities  Pulses:  Normal pulses noted Extremities: No clubbing, cyanosis, edema or deformities noted Neurological: Alert oriented x 4, grossly nonfocal Psychological:  Alert and cooperative. Normal  mood and affect  Assessment and Recommendations:  1. CIC with generalized abdominal discomfort and bloating. Plenvu prep bowel purg. Then begin Trulance 3 mg daily and Miralax qd-tid titrated for a complete daily bowel movement. Call if results not adequate otherwise REV in 6 months.

## 2017-03-29 ENCOUNTER — Ambulatory Visit: Payer: Medicare Other | Admitting: Neurology

## 2017-04-09 ENCOUNTER — Telehealth: Payer: Self-pay | Admitting: Gastroenterology

## 2017-04-10 NOTE — Telephone Encounter (Signed)
Informed patient to come by our office and pick up samples and when she is starting to run out in the future to contact our office. Patient verbalized understanding.

## 2017-04-10 NOTE — Telephone Encounter (Signed)
Spoke with Robin [name] and he states there is not as good coverage with Medicare patients unfortunately. He states the company is working on getting better coverage. What would you like to do in the interim? He states he can provide samples but I know that is not a long term solution.

## 2017-04-10 NOTE — Telephone Encounter (Signed)
Ruby Cola, the Trulance drug rep, is here today. Please ask him for advise.

## 2017-04-10 NOTE — Telephone Encounter (Signed)
Patient states her Trulance is $135 for a 30 day supply. Informed patient I did call and get a approval for a prior authorization with Trulance but her co-pay must be still high. I asked patient if this medication is helping with her constipation and patient states it is but it's unaffordable even with the approval with her insurance  Informed patient that I can send this message to Dr. Fuller Plan and see if there is another medication we can switch her to but I know she has tried Linzess already in the past. Patient states she would be willing to try again but she knows she cannot afford Trulance. Please advise Dr. Fuller Plan.

## 2017-04-10 NOTE — Telephone Encounter (Signed)
Let's do sample for now

## 2017-04-30 ENCOUNTER — Other Ambulatory Visit: Payer: Self-pay

## 2017-04-30 ENCOUNTER — Telehealth: Payer: Self-pay | Admitting: Endocrinology

## 2017-04-30 MED ORDER — FUROSEMIDE 20 MG PO TABS: 10.0000 mg | ORAL_TABLET | Freq: Every day | ORAL | 2 refills | Status: DC

## 2017-04-30 NOTE — Telephone Encounter (Signed)
Refill submitted. 

## 2017-04-30 NOTE — Telephone Encounter (Signed)
Patient needs script for Lasik 20 mg-1/2 tablet daily sent to [facility]) because patient already used [facility] for 1st refill-insurance won't pay for 2nd refill unless done with  [facility]) ph# [phone]. Patient requests 90 day supply.

## 2017-06-02 NOTE — Progress Notes (Signed)
HPI:  Robin [name] is a pleasant 84 y.o. here for follow up. Chronic medical problems summarized below were reviewed for changes and stability and were updated as needed below. These issues and their treatment remain stable for the most part.  She has had a lot going on.  A tree fell on her house and there are some family issues with her grandchildren.  She is safe.  She does not want a medication for stress or to see a counselor.  No suicidal ideation or thoughts of self-harm.  She is taking Lasix 10 mg daily from her endocrinologist.  She continues to have some swelling in her ankles, though not as bad.. Denies CP, SOB, DOE, treatment intolerance or new symptoms.   AWV 02/2017 HTN/Aortic atherosclerosis: -she keeps home log and BP runs much better at home, we have checked her cuff  -lisinopril 32m, norvasc 528m-endo had rxd lasix for edema and calcium, she stopped it on her own, restarted last endo appt  Dizziness/Balance issues/HEadaches: -neurology eval 11/10/16 - EEG (normal), MRI with chronic sm vessel disease and asa was advised, CTA ok per neurology notes -neurology advised gabapentin for headaches -neurology referred to cardiology for eval, echo with mild AS (2018) per cardiology notes  HLD: -meds: crestor 2.64m59maily, zetia 31m364mx statin intol  Hypercalcemia/Hyperparathyroidism/Vit D def -seeing endocrinologist -Vit D restarted by endo 2018  GERD: -she stopped taking her PPI, used to take nexium 40, reports takes rarely when has symptoms -hx hiatal hernia and gastritis  ROS: See pertinent positives and negatives per HPI.  Past Medical History:  Diagnosis Date  . Asthma   . Colon polyps    tubulovillous adenoma, 2010  . External hemorrhoids without mention of complication   . Headache    eval with neuro in 2011  . Hyperlipemia   . [facility]. KumaDwyane Deeendocrinology  . Hypertension   . IBS (irritable bowel syndrome), GERD, hx  colon polyps, chronic abd pain - followed by Dr. PattSharlett IlesGI 06/11/2012  . Osteoporosis 08/29/2011  . Vertigo    eval with neuro in 2011, brief recurrence 2016    Past Surgical History:  Procedure Laterality Date  . BACK SURGERY  1974  . CARPAL TUNNEL RELEASE  2/10  . TONSILLECTOMY      Family History  Problem Relation Age of Onset  . Diabetes Brother   . Hypertension Brother   . Hypertension Father   . Stroke Father   . Diabetes Sister   . Hypertension Sister   . Heart disease Sister   . CAD Mother        Died of MI at age 68  101CAD Brother        Died of MI at age 73  65Colon cancer Neg Hx   . Hypercalcemia Neg Hx     Social History   Socioeconomic History  . Marital status: Widowed    Spouse name: None  . Number of children: 2  . Years of education: None  . Highest education level: None  Social Needs  . Financial resource strain: None  . Food insecurity - worry: None  . Food insecurity - inability: None  . Transportation needs - medical: None  . Transportation needs - non-medical: None  Occupational History  . Occupation: retired    EmplFish farm managerTIRED  Tobacco Use  . Smoking status: Never Smoker  . Smokeless tobacco: Never Used  Substance and Sexual Activity  .  Alcohol use: No    Alcohol/week: 0.0 oz  . Drug use: No  . Sexual activity: None  Other Topics Concern  . None  Social History Narrative   Lives alone in a one story home.  Has 2 children.  Retired from working for a bank.  Education: high school.      Current Outpatient Medications:  .  amLODipine (NORVASC) 5 MG tablet, Take 1 tablet (5 mg total) by mouth daily., Disp: 90 tablet, Rfl: 3 .  esomeprazole (NEXIUM) 40 MG capsule, Take 1 capsule (40 mg total) by mouth daily at 12 noon. (Patient taking differently: Take 40 mg by mouth daily as needed. ), Disp: 30 capsule, Rfl: 11 .  ezetimibe (ZETIA) 10 MG tablet, TAKE 1 BY MOUTH DAILY, Disp: 90 tablet, Rfl: 3 .  furosemide (LASIX) 20 MG tablet,  Take 0.5 tablets (10 mg total) by mouth daily., Disp: 45 tablet, Rfl: 2 .  hydrocortisone (ANUSOL-HC) 2.5 % rectal cream, Apply to the rectal area 2-3 times daily as needed., Disp: 30 g, Rfl: 1 .  lisinopril (PRINIVIL,ZESTRIL) 40 MG tablet, Take 1 tablet (40 mg total) by mouth daily., Disp: 90 tablet, Rfl: 3 .  PEG-KCl-NaCl-NaSulf-Na Asc-C (PLENVU) 140 g SOLR, Take 1 kit as directed by mouth. (Patient taking differently: Take 1 kit by mouth as directed. As needed), Disp: 1 each, Rfl: 0 .  Plecanatide (TRULANCE) 3 MG TABS, Take 1 tablet daily by mouth. (Patient taking differently: Take 1 tablet by mouth daily as needed. ), Disp: 30 tablet, Rfl: 11 .  rosuvastatin (CRESTOR) 5 MG tablet, Take 1/2 tablet by mouth daily., Disp: 45 tablet, Rfl: 3  EXAM:  Vitals:   06/04/17 0908 06/04/17 0916  BP: 130/80 130/80  Pulse: 77   Temp: 98.3 F (36.8 C)     Body mass index is 28.08 kg/m.  GENERAL: vitals reviewed and listed above, alert, oriented, appears well hydrated and in no acute distress  HEENT: atraumatic, conjunttiva clear, no obvious abnormalities on inspection of external nose and ears  NECK: no obvious masses on inspection  LUNGS: clear to auscultation bilaterally, no wheezes, rales or rhonchi, good air movement  CV: HRRR, trace lower extremity edema  MS: moves all extremities without noticeable abnormality  PSYCH: pleasant and cooperative, no obvious depression or anxiety  ASSESSMENT AND PLAN:  Discussed the following assessment and plan:  Essential hypertension  Stress  Hyperparathyroidism ([place]), Chronic  HYPERCHOLESTEROLEMIA  BMI 28.0-28.9,adult  -Discussed the stress, offered cognitive behavioral therapy, she declined -The blood pressure was elevated around 150 over 80s on arrival, came down to 130/80; discussed options for management -she thinks it runs okay at home and was reluctant to doing any changes in medicines.  She did agreed to bump the Lasix up to the 20  mg a day. -Advised basic metabolic panel to check kidney function, plans to do another day as she has not had any water -Follow-up 3 months -Patient advised to return or notify a doctor immediately if symptoms worsen or persist or new concerns arise.  Patient Instructions  BEFORE YOU LEAVE: -Lab appointment in 1 week, please come hydrated -not fasting -follow up: 3 months  Please increase the Lasix to 20 mg daily.   We recommend the following healthy lifestyle for LIFE: 1) Small portions. But, make sure to get regular (at least 3 per day), healthy meals and small healthy snacks if needed.  2) Eat a healthy clean diet.   TRY TO EAT: -at least 5-7 servings  of low sugar, colorful, and nutrient rich vegetables per day (not corn, potatoes or bananas.) -berries are the best choice if you wish to eat fruit (only eat small amounts if trying to reduce weight)  -lean meets (fish, white meat of chicken or [place]) -vegan proteins for some meals - beans or tofu, whole grains, nuts and seeds -Replace bad fats with good fats - good fats include: fish, nuts and seeds, canola oil, olive oil -small amounts of low fat or non fat dairy -small amounts of100 % whole grains - check the lables -drink plenty of water  AVOID: -SUGAR, sweets, anything with added sugar, corn syrup or sweeteners - must read labels as even foods advertised as "healthy" often are loaded with sugar -if you must have a sweetener, small amounts of stevia may be best -sweetened beverages and artificially sweetened beverages -simple starches (rice, bread, potatoes, pasta, chips, etc - small amounts of 100% whole grains are ok) -red meat, pork, butter -fried foods, fast food, processed food, excessive dairy, eggs and coconut.  3)Get at least 150 minutes of sweaty aerobic exercise per week.  4)Reduce stress - consider counseling, meditation and relaxation to balance other aspects of your life.     Lucretia Kern, DO

## 2017-06-04 ENCOUNTER — Ambulatory Visit (INDEPENDENT_AMBULATORY_CARE_PROVIDER_SITE_OTHER): Payer: Medicare Other | Admitting: Family Medicine

## 2017-06-04 ENCOUNTER — Encounter: Payer: Self-pay | Admitting: Family Medicine

## 2017-06-04 VITALS — BP 130/80 | HR 77 | Temp 98.3°F | Ht 62.0 in | Wt 153.5 lb

## 2017-06-04 DIAGNOSIS — I10 Essential (primary) hypertension: Secondary | ICD-10-CM

## 2017-06-04 DIAGNOSIS — E7800 Pure hypercholesterolemia, unspecified: Secondary | ICD-10-CM

## 2017-06-04 DIAGNOSIS — Z6828 Body mass index (BMI) 28.0-28.9, adult: Secondary | ICD-10-CM

## 2017-06-04 DIAGNOSIS — E213 Hyperparathyroidism, unspecified: Secondary | ICD-10-CM

## 2017-06-04 DIAGNOSIS — F439 Reaction to severe stress, unspecified: Secondary | ICD-10-CM

## 2017-06-04 NOTE — Patient Instructions (Signed)
BEFORE YOU LEAVE: -Lab appointment in 1 week, please come hydrated -not fasting -follow up: 3 months  Please increase the Lasix to 20 mg daily.   We recommend the following healthy lifestyle for LIFE: 1) Small portions. But, make sure to get regular (at least 3 per day), healthy meals and small healthy snacks if needed.  2) Eat a healthy clean diet.   TRY TO EAT: -at least 5-7 servings of low sugar, colorful, and nutrient rich vegetables per day (not corn, potatoes or bananas.) -berries are the best choice if you wish to eat fruit (only eat small amounts if trying to reduce weight)  -lean meets (fish, white meat of chicken or [place]) -vegan proteins for some meals - beans or tofu, whole grains, nuts and seeds -Replace bad fats with good fats - good fats include: fish, nuts and seeds, canola oil, olive oil -small amounts of low fat or non fat dairy -small amounts of100 % whole grains - check the lables -drink plenty of water  AVOID: -SUGAR, sweets, anything with added sugar, corn syrup or sweeteners - must read labels as even foods advertised as "healthy" often are loaded with sugar -if you must have a sweetener, small amounts of stevia may be best -sweetened beverages and artificially sweetened beverages -simple starches (rice, bread, potatoes, pasta, chips, etc - small amounts of 100% whole grains are ok) -red meat, pork, butter -fried foods, fast food, processed food, excessive dairy, eggs and coconut.  3)Get at least 150 minutes of sweaty aerobic exercise per week.  4)Reduce stress - consider counseling, meditation and relaxation to balance other aspects of your life.

## 2017-06-11 ENCOUNTER — Other Ambulatory Visit (INDEPENDENT_AMBULATORY_CARE_PROVIDER_SITE_OTHER): Payer: Medicare Other

## 2017-06-11 ENCOUNTER — Other Ambulatory Visit: Payer: Self-pay | Admitting: *Deleted

## 2017-06-11 DIAGNOSIS — I10 Essential (primary) hypertension: Secondary | ICD-10-CM

## 2017-06-11 LAB — BASIC METABOLIC PANEL
BUN: 15 mg/dL (ref 6–23)
CALCIUM: 10.6 mg/dL — AB (ref 8.4–10.5)
CO2: 29 meq/L (ref 19–32)
Chloride: 103 mEq/L (ref 96–112)
Creatinine, Ser: 0.75 mg/dL (ref 0.40–1.20)
GFR: 78.74 mL/min (ref >60.00)
Glucose, Bld: 97 mg/dL (ref 70–99)
POTASSIUM: 4.1 meq/L (ref 3.5–5.1)
SODIUM: 139 meq/L (ref 135–145)

## 2017-06-15 ENCOUNTER — Telehealth: Payer: Self-pay

## 2017-06-15 DIAGNOSIS — R42 Dizziness and giddiness: Secondary | ICD-10-CM

## 2017-06-15 NOTE — Telephone Encounter (Signed)
Copied from [place] [id number]. Topic: Referral - Request >> Jun 15, 2017  9:47 AM Antonieta Iba C wrote: Reason for CRM: pt called in to request a call back. She is requesting a referral to ENT.   CB: [phone]

## 2017-06-17 NOTE — Telephone Encounter (Signed)
See what she wants to see ENT for - this info should be collected at time of initial call. Thank.s

## 2017-06-18 NOTE — Telephone Encounter (Signed)
Referral entered.     Copied from [place] [id number]. Topic: Referral - Request >> Jun 15, 2017  9:47 AM Antonieta Iba C wrote: Reason for CRM: pt called in to request a call back. She is requesting a referral to ENT.   CB: [phone]  >> Jun 18, 2017 11:04 AM Percell Belt A wrote: Pt called in and said that she has been dizzy and she feel that it is a inter ear issue and would like a ENT to take a look at her ears.  This dizziness has been going on for a while   Best number [phone]

## 2017-06-18 NOTE — Telephone Encounter (Signed)
Okay to refer for vertigo.

## 2017-06-18 NOTE — Telephone Encounter (Signed)
I left a detailed message for the pt to return my call with reason for referral.

## 2017-06-26 DIAGNOSIS — R42 Dizziness and giddiness: Secondary | ICD-10-CM | POA: Insufficient documentation

## 2017-07-13 HISTORY — PX: EYE SURGERY: SHX253

## 2017-07-30 DIAGNOSIS — H25013 Cortical age-related cataract, bilateral: Secondary | ICD-10-CM | POA: Diagnosis not present

## 2017-07-30 DIAGNOSIS — H2513 Age-related nuclear cataract, bilateral: Secondary | ICD-10-CM | POA: Diagnosis not present

## 2017-07-30 DIAGNOSIS — D3131 Benign neoplasm of right choroid: Secondary | ICD-10-CM | POA: Diagnosis not present

## 2017-07-30 DIAGNOSIS — H5203 Hypermetropia, bilateral: Secondary | ICD-10-CM | POA: Diagnosis not present

## 2017-08-02 DIAGNOSIS — H25012 Cortical age-related cataract, left eye: Secondary | ICD-10-CM | POA: Diagnosis not present

## 2017-08-02 DIAGNOSIS — H2512 Age-related nuclear cataract, left eye: Secondary | ICD-10-CM | POA: Diagnosis not present

## 2017-08-02 DIAGNOSIS — H25812 Combined forms of age-related cataract, left eye: Secondary | ICD-10-CM | POA: Diagnosis not present

## 2017-08-07 ENCOUNTER — Ambulatory Visit: Payer: Medicare Other | Admitting: Physical Therapy

## 2017-08-16 NOTE — Progress Notes (Signed)
HPI: FU dizziness. ABIs August 2010 normal. Stress echocardiogram February 2014 normal. MRI July 2018 showed chronic small vessel ischemic changes in the cerebral white matter and pons. MRA without significant vertebrobasilar disease. CTA showed normal circle of Willis. EEG also normal. Echo 10/18 showed normal LV function, grade 1 DD, mild AS with mean gradient 12 mmHg). Patient has chronic mild dizziness. Since last seen, she denies dyspnea, chest pain, syncope or near syncope.  She has chronic mild dizziness.  Current Outpatient Medications  Medication Sig Dispense Refill  . amLODipine (NORVASC) 5 MG tablet Take 1 tablet (5 mg total) by mouth daily. 90 tablet 3  . esomeprazole (NEXIUM) 40 MG capsule Take 1 capsule (40 mg total) by mouth daily at 12 noon. (Patient taking differently: Take 40 mg by mouth daily as needed. ) 30 capsule 11  . ezetimibe (ZETIA) 10 MG tablet TAKE 1 BY MOUTH DAILY 90 tablet 3  . furosemide (LASIX) 20 MG tablet Take 0.5 tablets (10 mg total) by mouth daily. 45 tablet 2  . hydrocortisone (ANUSOL-HC) 2.5 % rectal cream Apply to the rectal area 2-3 times daily as needed. 30 g 1  . lisinopril (PRINIVIL,ZESTRIL) 40 MG tablet Take 1 tablet (40 mg total) by mouth daily. 90 tablet 3  . PEG-KCl-NaCl-NaSulf-Na Asc-C (PLENVU) 140 g SOLR Take 1 kit as directed by mouth. (Patient taking differently: Take 1 kit by mouth as directed. As needed) 1 each 0  . Plecanatide (TRULANCE) 3 MG TABS Take 1 tablet daily by mouth. (Patient taking differently: Take 1 tablet by mouth daily as needed. ) 30 tablet 11  . rosuvastatin (CRESTOR) 5 MG tablet Take 1/2 tablet by mouth daily. 45 tablet 3   No current facility-administered medications for this visit.      Past Medical History:  Diagnosis Date  . Asthma   . Colon polyps    tubulovillous adenoma, 2010  . External hemorrhoids without mention of complication   . Headache    eval with neuro in 2011  . Hyperlipemia   .  Hyperparathyroidism [facility])    seeing Dr. Dwyane Dee in endocrinology  . Hypertension   . IBS (irritable bowel syndrome), GERD, hx colon polyps, chronic abd pain - followed by Dr. Sharlett Iles in GI 06/11/2012  . Osteoporosis 08/29/2011  . Vertigo    eval with neuro in 2011, brief recurrence 2016    Past Surgical History:  Procedure Laterality Date  . BACK SURGERY  1974  . CARPAL TUNNEL RELEASE  2/10  . TONSILLECTOMY      Social History   Socioeconomic History  . Marital status: Widowed    Spouse name: Not on file  . Number of children: 2  . Years of education: Not on file  . Highest education level: Not on file  Occupational History  . Occupation: retired    Fish farm manager: RETIRED  Social Needs  . Financial resource strain: Not on file  . Food insecurity:    Worry: Not on file    Inability: Not on file  . Transportation needs:    Medical: Not on file    Non-medical: Not on file  Tobacco Use  . Smoking status: Never Smoker  . Smokeless tobacco: Never Used  Substance and Sexual Activity  . Alcohol use: No    Alcohol/week: 0.0 oz  . Drug use: No  . Sexual activity: Not on file  Lifestyle  . Physical activity:    Days per week: Not on file  Minutes per session: Not on file  . Stress: Not on file  Relationships  . Social connections:    Talks on phone: Not on file    Gets together: Not on file    Attends religious service: Not on file    Active member of club or organization: Not on file    Attends meetings of clubs or organizations: Not on file    Relationship status: Not on file  . Intimate partner violence:    Fear of current or ex partner: Not on file    Emotionally abused: Not on file    Physically abused: Not on file    Forced sexual activity: Not on file  Other Topics Concern  . Not on file  Social History Narrative   Lives alone in a one story home.  Has 2 children.  Retired from working for a bank.  Education: high school.     Family History  Problem Relation  Age of Onset  . Diabetes Brother   . Hypertension Brother   . Hypertension Father   . Stroke Father   . Diabetes Sister   . Hypertension Sister   . Heart disease Sister   . CAD Mother        Died of MI at age 64  . CAD Brother        Died of MI at age 34  . Colon cancer Neg Hx   . Hypercalcemia Neg Hx     ROS: no fevers or chills, productive cough, hemoptysis, dysphasia, odynophagia, melena, hematochezia, dysuria, hematuria, rash, seizure activity, orthopnea, PND, pedal edema, claudication. Remaining systems are negative.  Physical Exam: Well-developed well-nourished in no acute distress.  Skin is warm and dry.  HEENT is normal.  Neck is supple.  Chest is clear to auscultation with normal expansion.  Cardiovascular exam is regular rate and rhythm. 2/6 systolic murmur Abdominal exam nontender or distended. No masses palpated. Extremities show no edema. neuro grossly intact   A/P  1 near syncope-patient has chronic mild dizziness but has had no further near syncopal episodes.  We can consider a monitor in the future if she has more frequent episodes.  2 hypertension-blood pressure is controlled.  Continue present medications.  3 hyperlipidemia-continue statin.  4 mild aortic stenosis-she will need follow-up echoes in the future.  Kirk Ruths, MD

## 2017-08-22 ENCOUNTER — Ambulatory Visit (INDEPENDENT_AMBULATORY_CARE_PROVIDER_SITE_OTHER): Payer: Medicare Other | Admitting: Cardiology

## 2017-08-22 ENCOUNTER — Encounter: Payer: Self-pay | Admitting: Cardiology

## 2017-08-22 VITALS — BP 140/72 | HR 65 | Ht 62.0 in | Wt 155.4 lb

## 2017-08-22 DIAGNOSIS — I10 Essential (primary) hypertension: Secondary | ICD-10-CM

## 2017-08-22 DIAGNOSIS — I350 Nonrheumatic aortic (valve) stenosis: Secondary | ICD-10-CM | POA: Diagnosis not present

## 2017-08-22 DIAGNOSIS — R55 Syncope and collapse: Secondary | ICD-10-CM

## 2017-08-22 NOTE — Patient Instructions (Signed)
Your physician wants you to follow-up in: ONE YEAR WITH DR CRENSHAW You will receive a reminder letter in the mail two months in advance. If you don't receive a letter, please call our office to schedule the follow-up appointment.   If you need a refill on your cardiac medications before your next appointment, please call your pharmacy.  

## 2017-08-23 DIAGNOSIS — H25011 Cortical age-related cataract, right eye: Secondary | ICD-10-CM | POA: Diagnosis not present

## 2017-08-23 DIAGNOSIS — H2511 Age-related nuclear cataract, right eye: Secondary | ICD-10-CM | POA: Diagnosis not present

## 2017-08-23 DIAGNOSIS — H25811 Combined forms of age-related cataract, right eye: Secondary | ICD-10-CM | POA: Diagnosis not present

## 2017-09-01 NOTE — Progress Notes (Signed)
HPI:  Using dictation device. Unfortunately this device frequently misinterprets words/phrases.  Robin [name] is a pleasant 84 y.o. here for follow up. Chronic medical problems summarized below were reviewed for changes. Reports diet has been poor. Stress from tree falling on house last year. Doing better now. Had cataract surgery. Wonders if can decrease lasix - she thinks causes leg cramps. Reports home BP always "good" at 130/70-80s. . Denies CP, SOB, DOE, treatment intolerance or new symptoms.  AWV 02/13/17 HTN/Aortic atherosclerosis/mild AS: -sees cardiologist, Dr. [name] -she keeps home log and BP runs much better at home, we have checked her cuff  -lisinopril 40mg , norvasc 5mg  -endo had rxd lasix for edema and calcium, she stopped it on her own, restarted last endo appt -gets some edema in legs in the evenings, not using compression -she has a long list of antihypertensives she did not tolerate, is reluctant to take any of them  Dizziness/Balance issues/HEadaches: -neurology eval 11/10/16 - EEG (normal), MRIwith chronic sm vessel disease and asa was advised, CTA ok per neurology notes -neurology advised gabapentin for headaches -neurology referred to cardiology for eval, echo with mild AS (2018) per cardiology notes  HLD: -meds: crestor 2.5mg  daily, zetia 10mg  -hx statin intol  Hypercalcemia/Hyperparathyroidism/Vit D def -seeing endocrinologist -Vit D restarted by endo 2018  GERD: -she stopped taking her PPI, used to take nexium 20, reports takes rarely when has symptoms -hx hiatal hernia and gastritis   ROS: See pertinent positives and negatives per HPI.  Past Medical History:  Diagnosis Date  . Asthma   . Colon polyps    tubulovillous adenoma, 2010  . External hemorrhoids without mention of complication   . Headache    eval with neuro in 2011  . Hyperlipemia   . Hyperparathyroidism [facility])    seeing Dr. Dwyane Dee in endocrinology  . Hypertension   . IBS  (irritable bowel syndrome), GERD, hx colon polyps, chronic abd pain - followed by Dr. Sharlett Iles in GI 06/11/2012  . Osteoporosis 08/29/2011  . Vertigo    eval with neuro in 2011, brief recurrence 2016    Past Surgical History:  Procedure Laterality Date  . BACK SURGERY  1974  . CARPAL TUNNEL RELEASE  2/10  . TONSILLECTOMY      Family History  Problem Relation Age of Onset  . Diabetes Brother   . Hypertension Brother   . Hypertension Father   . Stroke Father   . Diabetes Sister   . Hypertension Sister   . Heart disease Sister   . CAD Mother        Died of MI at age 71  . CAD Brother        Died of MI at age 58  . Colon cancer Neg Hx   . Hypercalcemia Neg Hx     SOCIAL HX: see hpi   Current Outpatient Medications:  .  amLODipine (NORVASC) 5 MG tablet, Take 1 tablet (5 mg total) by mouth daily., Disp: 90 tablet, Rfl: 3 .  esomeprazole (NEXIUM) 40 MG capsule, Take 1 capsule (40 mg total) by mouth daily at 12 noon. (Patient taking differently: Take 40 mg by mouth daily as needed. ), Disp: 30 capsule, Rfl: 11 .  ezetimibe (ZETIA) 10 MG tablet, TAKE 1 BY MOUTH DAILY, Disp: 90 tablet, Rfl: 3 .  furosemide (LASIX) 20 MG tablet, Take 1 tablet (20 mg total) by mouth daily., Disp: 90 tablet, Rfl: 1 .  hydrocortisone (ANUSOL-HC) 2.5 % rectal cream, Apply to the rectal area 2-3  times daily as needed., Disp: 30 g, Rfl: 1 .  lisinopril (PRINIVIL,ZESTRIL) 40 MG tablet, Take 1 tablet (40 mg total) by mouth daily., Disp: 90 tablet, Rfl: 3 .  rosuvastatin (CRESTOR) 5 MG tablet, Take 1/2 tablet by mouth daily., Disp: 45 tablet, Rfl: 3  EXAM:  Vitals:   09/03/17 0902  BP: 132/78  Pulse: 73  Temp: 98 F (36.7 C)    Body mass index is 28.84 kg/m.  GENERAL: vitals reviewed and listed above, alert, oriented, appears well hydrated and in no acute distress  HEENT: atraumatic, conjunttiva clear, no obvious abnormalities on inspection of external nose and ears  NECK: no obvious masses on  inspection  LUNGS: clear to auscultation bilaterally, no wheezes, rales or rhonchi, good air movement  CV: HRRR, no peripheral edema  MS: moves all extremities without noticeable abnormality  PSYCH: pleasant and cooperative, no obvious depression or anxiety  ASSESSMENT AND PLAN:  Discussed the following assessment and plan:  Essential hypertension - Plan: Basic metabolic panel, CBC  HYPERCHOLESTEROLEMIA  Hyperparathyroidism ([place])  -labs -lifestyle recs -discussed various options for BP management, she refuses to add medications or see specialist -she wants to decrease lasix - advise if does should compensate with another medication or lifestyle changes, she opted to think about this -follow up 3-4 momths -Patient advised to return or notify a doctor immediately if symptoms worsen or persist or new concerns arise.  Patient Instructions  BEFORE YOU LEAVE: -labs -follow up:  3 months  We have ordered labs or studies at this visit. It can take up to 1-2 weeks for results and processing. IF results require follow up or explanation, we will call you with instructions. Clinically stable results will be released to your [facility]. If you have not heard from [place] or cannot find your results in [facility] in 2 weeks please contact our office at [phone].  If you are not yet signed up for [facility], please consider signing up.   We recommend the following healthy lifestyle for LIFE: 1) Small portions. But, make sure to get regular (at least 3 per day), healthy meals and small healthy snacks if needed.  2) Eat a healthy clean diet.   TRY TO EAT: -at least 5-7 servings of low sugar, colorful, and nutrient rich vegetables per day (not corn, potatoes or bananas.) -berries are the best choice if you wish to eat fruit (only eat small amounts if trying to reduce weight)  -lean meets (fish, white meat of chicken or [place]) -vegan proteins for some meals - beans or tofu, whole grains, nuts and  seeds -Replace bad fats with good fats - good fats include: fish, nuts and seeds, canola oil, olive oil -small amounts of low fat or non fat dairy -small amounts of100 % whole grains - check the lables -drink plenty of water  AVOID: -SUGAR, sweets, anything with added sugar, corn syrup or sweeteners - must read labels as even foods advertised as "healthy" often are loaded with sugar -if you must have a sweetener, small amounts of stevia may be best -sweetened beverages and artificially sweetened beverages -simple starches (rice, bread, potatoes, pasta, chips, etc - small amounts of 100% whole grains are ok) -red meat, pork, butter -fried foods, fast food, processed food, excessive dairy, eggs and coconut.  3)Get at least 150 minutes of sweaty aerobic exercise per week.  4)Reduce stress - consider counseling, meditation and relaxation to balance other aspects of your life.  Sharice Harriss R Dayna Alia, DO   

## 2017-09-03 ENCOUNTER — Encounter: Payer: Self-pay | Admitting: Family Medicine

## 2017-09-03 ENCOUNTER — Ambulatory Visit (INDEPENDENT_AMBULATORY_CARE_PROVIDER_SITE_OTHER): Payer: Medicare Other | Admitting: Family Medicine

## 2017-09-03 VITALS — BP 132/78 | HR 73 | Temp 98.0°F | Ht 62.0 in | Wt 157.7 lb

## 2017-09-03 DIAGNOSIS — E213 Hyperparathyroidism, unspecified: Secondary | ICD-10-CM | POA: Diagnosis not present

## 2017-09-03 DIAGNOSIS — I10 Essential (primary) hypertension: Secondary | ICD-10-CM | POA: Diagnosis not present

## 2017-09-03 DIAGNOSIS — E7800 Pure hypercholesterolemia, unspecified: Secondary | ICD-10-CM

## 2017-09-03 LAB — BASIC METABOLIC PANEL
BUN: 15 mg/dL (ref 6–23)
CALCIUM: 10.4 mg/dL (ref 8.4–10.5)
CO2: 29 meq/L (ref 19–32)
CREATININE: 0.7 mg/dL (ref 0.40–1.20)
Chloride: 105 mEq/L (ref 96–112)
GFR: 85.21 mL/min (ref >60.00)
GLUCOSE: 76 mg/dL (ref 70–99)
Potassium: 4.1 mEq/L (ref 3.5–5.1)
Sodium: 141 mEq/L (ref 135–145)

## 2017-09-03 LAB — CBC
HCT: 39.6 % (ref 36.0–46.0)
Hemoglobin: 13.4 g/dL (ref 12.0–15.0)
MCHC: 33.8 g/dL (ref 30.0–36.0)
MCV: 92.9 fl (ref 78.0–100.0)
Platelets: 260 10*3/uL (ref 150.0–400.0)
RBC: 4.27 Mil/uL (ref 3.87–5.11)
RDW: 12.6 % (ref 11.5–15.5)
WBC: 5.9 10*3/uL (ref 4.0–10.5)

## 2017-09-03 MED ORDER — FUROSEMIDE 20 MG PO TABS: 20.0000 mg | ORAL_TABLET | Freq: Every day | ORAL | 1 refills | Status: DC

## 2017-09-03 NOTE — Patient Instructions (Addendum)
BEFORE YOU LEAVE: -labs -follow up: 3 months  We have ordered labs or studies at this visit. It can take up to 1-2 weeks for results and processing. IF results require follow up or explanation, we will call you with instructions. Clinically stable results will be released to your MYCHART. If you have not heard from us or cannot find your results in MY[facility]ART in 2 weeks please contact our office at [phone].  If you are not yet signed up for [facility], please consider signing up.   We recommend the following healthy lifestyle for LIFE: 1) Small portions. But, make sure to get regular (at least 3 per day), healthy meals and small healthy snacks if needed.  2) Eat a healthy clean diet.   TRY TO EAT: -at least 5-7 servings of low sugar, colorful, and nutrient rich vegetables per day (not corn, potatoes or bananas.) -berries are the best choice if you wish to eat fruit (only eat small amounts if trying to reduce weight)  -lean meets (fish, white meat of chicken or turkey) -vegan proteins for some meals - beans or tofu, whole grains, nuts and seeds -Replace bad fats with good fats - good fats include: fish, nuts and seeds, canola oil, olive oil -small amounts of low fat or non fat dairy -small amounts of100 % whole grains - check the lables -drink plenty of water  AVOID: -SUGAR, sweets, anything with added sugar, corn syrup or sweeteners - must read labels as even foods advertised as "healthy" often are loaded with sugar -if you must have a sweetener, small amounts of stevia may be best -sweetened beverages and artificially sweetened beverages -simple starches (rice, bread, potatoes, pasta, chips, etc - small amounts of 100% whole grains are ok) -red meat, pork, butter -fried foods, fast food, processed food, excessive dairy, eggs and coconut.  3)Get at least 150 minutes of sweaty aerobic exercise per week.  4)Reduce stress - consider counseling, meditation and relaxation to balance other  aspects of your life.        

## 2017-09-17 ENCOUNTER — Ambulatory Visit: Payer: Medicare Other | Admitting: Endocrinology

## 2017-10-02 ENCOUNTER — Other Ambulatory Visit: Payer: Self-pay

## 2017-10-02 ENCOUNTER — Ambulatory Visit: Payer: Medicare Other | Attending: Otolaryngology | Admitting: Physical Therapy

## 2017-10-02 ENCOUNTER — Encounter: Payer: Self-pay | Admitting: Physical Therapy

## 2017-10-02 VITALS — BP 164/87

## 2017-10-02 DIAGNOSIS — R42 Dizziness and giddiness: Secondary | ICD-10-CM | POA: Insufficient documentation

## 2017-10-02 DIAGNOSIS — R2681 Unsteadiness on feet: Secondary | ICD-10-CM | POA: Insufficient documentation

## 2017-10-03 NOTE — Therapy (Signed)
[facility] [address] [place], [place], [address] Phone: [phone]   Fax:  [phone]  Physical Therapy Evaluation  Patient Details  Name: Robin [name] MRN: [id number] Date of Birth: 1934-02-25 Referring Provider: Dr. Melida Quitter   Encounter Date: 10/02/2017  PT End of Session - 10/03/17 1756    Visit Number  1    Number of Visits  4    Date for PT Re-Evaluation  11/03/17    Authorization Type  medicare    Authorization Time Period  10-02-17 - 12-31-17    PT Start Time  1017    PT Stop Time  1100    PT Time Calculation (min)  43 min       Past Medical History:  Diagnosis Date  . Asthma   . Colon polyps    tubulovillous adenoma, 2010  . External hemorrhoids without mention of complication   . Headache    eval with neuro in 2011  . Hyperlipemia   . Hyperparathyroidism [facility])    seeing Dr. Dwyane Dee in endocrinology  . Hypertension   . IBS (irritable bowel syndrome), GERD, hx colon polyps, chronic abd pain - followed by Dr. Sharlett Iles in GI 06/11/2012  . Osteoporosis 08/29/2011  . Vertigo    eval with neuro in 2011, brief recurrence 2016    Past Surgical History:  Procedure Laterality Date  . BACK SURGERY  1974  . CARPAL TUNNEL RELEASE  2/10  . TONSILLECTOMY      Vitals:   10/02/17 1030  BP: (!) 164/87     Subjective Assessment - 10/03/17 1746    Subjective  Pt states she had episode of vertigo on Saturday, May 11 accompanied with nausea and near vomiting; pt reports her head "feels funny" at this time: reports constant dizzziness; feels like hard, heavy hat is on head; denies headache but has the feeling of pressure on left side of head     Patient Stated Goals  be able to function without dizziness         [facility] PT Assessment - 10/03/17 0001      Assessment   Medical Diagnosis  Vertigo:  h/o viral labyrinthitis     Referring Provider  Dr. Melida Quitter    Onset Date/Surgical Date  09/22/17  started in 2016      Balance Screen   Has the patient fallen in the past 6 months  No    Has the patient had a decrease in activity level because of a fear of falling?   No    Is the patient reluctant to leave their home because of a fear of falling?   No      Prior Function   Level of Independence  Independent           Vestibular Assessment - 10/03/17 0001      Vestibular Assessment   General Observation  Pt is an 84 yr old lady with history of vertigo; pt has been treated at this facility 2 previous admissions for vertigo       Symptom Behavior   Type of Dizziness  Lightheadedness    Frequency of Dizziness  constant    Duration of Dizziness  constant    Aggravating Factors  Activity in general    Relieving Factors  Rest      Occulomotor Exam   Occulomotor Alignment  Abnormal R eye hypertropia    Spontaneous  Absent    Smooth Pursuits  Intact  Positional Testing   Dix-Hallpike  Dix-Hallpike Right;Dix-Hallpike Left    Sidelying Test  Sidelying Right;Sidelying Left      Dix-Hallpike Right   Dix-Hallpike Right Duration  None    Dix-Hallpike Right Symptoms  No nystagmus      Dix-Hallpike Left   Dix-Hallpike Left Duration  none    Dix-Hallpike Left Symptoms  No nystagmus      Sidelying Right   Sidelying Right Duration  subjectively reports some dizziness in this position    Sidelying Right Symptoms  No nystagmus      Sidelying Left   Sidelying Left Duration  no increased dizziness reported    Sidelying Left Symptoms  No nystagmus          Objective measurements completed on examination: See above findings.              PT Education - 10/03/17 1806    Education provided  Yes    Education Details  x1 viewiing and habituation     Person(s) Educated  Patient    Methods  Explanation;Handout    Comprehension  Verbalized understanding          PT Long Term Goals - 10/03/17 1808      PT LONG TERM GOAL #1   Title  The patient will be indep  with progression of HEP for post d/c    Time  4    Period  Weeks    Status  New    Target Date  11/02/17      PT LONG TERM GOAL #2   Title  The patient will tolerate sit<>bilateral sidelying without c/o dizziness.     Time  4    Period  Weeks    Status  New    Target Date  11/02/17      PT LONG TERM GOAL #4   Title  The patient will tolerate gaze x 1 viewing x 60 seconds without c/o visual blurring or dizziness to demo improving vestibular adaptation.    Time  4    Period  Weeks    Status  New    Target Date  11/02/17             Plan - 10/03/17 1757    Clinical Impression Statement  Pt is an 84 yr old lady with c/o chronic vertigo of unknown etiology - appears to be multi-factorial in nature.  Pt has been seen for vestibular rehab at this facility in Oct. 2017 and in June 2018 with pt reporting no signficant changes in vertigo with PT.  No nystagmus noted with any positional testing.  Pt reports an area of tightness around head and an area of pressure on left lateral side of head.  History and Personal Factors relevant to plan of care:  h/o viral labyrinthiasis    Clinical Presentation  Stable    Clinical Presentation due to:  dizziness - h/o viral labyrinthiasis    Clinical Decision Making  Low    Rehab Potential  Fair    Clinical Impairments Affecting Rehab Potential  chronicity of dizziness with no known etiology     PT Frequency  1x / week    PT Duration  4 weeks    PT Treatment/Interventions  ADLs/Self Care Home Management;Canalith Repostioning;Therapeutic activities;Therapeutic exercise;Neuromuscular re-education;Patient/family education;Vestibular    PT Next Visit Plan  reassess vertigo; check HEP    PT Home Exercise Plan  x1 viewing, sit to sidelying    Consulted and Agree with Plan of Care  Patient        Patient will benefit from skilled therapeutic intervention in order to improve the following deficits and impairments:  Dizziness, Decreased balance  Visit Diagnosis: Dizziness and giddiness - Plan: PT plan of care cert/re-cert  Unsteadiness on feet - Plan: PT plan of care cert/re-cert     Problem List Patient Active Problem List   Diagnosis Date Noted  . Cyst of left ovary 07/04/2016  . Hyperparathyroidism ([place]) 08/24/2014  . Slow transit constipation 01/13/2014  . IBS (irritable bowel syndrome), GERD, hx colon polyps, chronic abd pain - followed by Dr. Sharlett Iles in GI 06/11/2012  . Osteoporosis 08/29/2011  . Vitamin D deficiency 08/29/2011  . GERD (gastroesophageal reflux disease) 11/23/2010  . HYPERCHOLESTEROLEMIA 09/09/2007  . Essential hypertension 09/09/2007    [place], PT 10/03/2017, 6:14 PM  [facility] [address] [place], [place], [address] Phone: [phone]   Fax:  [phone]  Name: Robin [name] MRN: [id number] Date of Birth: [birth year]

## 2017-10-17 ENCOUNTER — Ambulatory Visit (INDEPENDENT_AMBULATORY_CARE_PROVIDER_SITE_OTHER): Payer: Medicare Other | Admitting: Endocrinology

## 2017-10-17 ENCOUNTER — Encounter: Payer: Self-pay | Admitting: Endocrinology

## 2017-10-17 VITALS — BP 152/72 | HR 75 | Wt 159.0 lb

## 2017-10-17 DIAGNOSIS — E213 Hyperparathyroidism, unspecified: Secondary | ICD-10-CM

## 2017-10-17 LAB — VITAMIN D 25 HYDROXY (VIT D DEFICIENCY, FRACTURES): VITD: 37.13 ng/mL (ref 30.00–100.00)

## 2017-10-17 NOTE — Progress Notes (Signed)
Subjective:    Patient ID: Robin [name], female    DOB: 08/28/1933, 84 y.o.   MRN: [id number]  HPI The state of at least three ongoing medical problems is addressed today, with interval history of each noted here: Pt returns for f/u of hyperparathyroidism (probably combined primary and secondary; first noted in 2009; she has never had urolithiasis, PUD, pancreatitis, depression, or bony fracture; DEXA in 2016 showed osteopenia, and she declines f/u of this).  Denies cramps. She was recently noted to have hypercalcemia. This is a stable problem.  Vitamin-D deficiency: she denies numbness.  She takes vitamin-D, 2000 units per day.  This is a stable problem. HTN: leg edema has recurred.  She says at home, BP is well-controlled at home.  This is a stable problem.   Past Medical History:  Diagnosis Date  . Asthma   . Colon polyps    tubulovillous adenoma, 2010  . External hemorrhoids without mention of complication   . Headache    eval with neuro in 2011  . Hyperlipemia   . Hyperparathyroidism [facility])    seeing Dr. Dwyane Dee in endocrinology  . Hypertension   . IBS (irritable bowel syndrome), GERD, hx colon polyps, chronic abd pain - followed by Dr. Sharlett Iles in GI 06/11/2012  . Osteoporosis 08/29/2011  . Vertigo    eval with neuro in 2011, brief recurrence 2016    Past Surgical History:  Procedure Laterality Date  . BACK SURGERY  1974  . CARPAL TUNNEL RELEASE  2/10  . TONSILLECTOMY      Social History   Socioeconomic History  . Marital status: Widowed    Spouse name: Not on file  . Number of children: 2  . Years of education: Not on file  . Highest education level: Not on file  Occupational History  . Occupation: retired    Fish farm manager: RETIRED  Social Needs  . Financial resource strain: Not on file  . Food insecurity:    Worry: Not on file    Inability: Not on file  . Transportation needs:    Medical: Not on file    Non-medical: Not on file  Tobacco Use  . Smoking status:  Never Smoker  . Smokeless tobacco: Never Used  Substance and Sexual Activity  . Alcohol use: No    Alcohol/week: 0.0 oz  . Drug use: No  . Sexual activity: Not on file  Lifestyle  . Physical activity:    Days per week: Not on file    Minutes per session: Not on file  . Stress: Not on file  Relationships  . Social connections:    Talks on phone: Not on file    Gets together: Not on file    Attends religious service: Not on file    Active member of club or organization: Not on file    Attends meetings of clubs or organizations: Not on file    Relationship status: Not on file  . Intimate partner violence:    Fear of current or ex partner: Not on file    Emotionally abused: Not on file    Physically abused: Not on file    Forced sexual activity: Not on file  Other Topics Concern  . Not on file  Social History Narrative   Lives alone in a one story home.  Has 2 children.  Retired from working for a bank.  Education: high school.     Current Outpatient Medications on File Prior to Visit  Medication Sig Dispense  Refill  . amLODipine (NORVASC) 5 MG tablet Take 1 tablet (5 mg total) by mouth daily. 90 tablet 3  . cetirizine (ZYRTEC) 10 MG tablet Take 10 mg by mouth daily.    . Cholecalciferol (VITAMIN D) 2000 units CAPS Take 2,000 Units by mouth daily.    [place] esomeprazole (NEXIUM) 40 MG capsule Take 1 capsule (40 mg total) by mouth daily at 12 noon. (Patient taking differently: Take 40 mg by mouth daily as needed. ) 30 capsule 11  . ezetimibe (ZETIA) 10 MG tablet TAKE 1 BY MOUTH DAILY 90 tablet 3  . furosemide (LASIX) 20 MG tablet Take 1 tablet (20 mg total) by mouth daily. 90 tablet 1  . hydrocortisone (ANUSOL-HC) 2.5 % rectal cream Apply to the rectal area 2-3 times daily as needed. 30 g 1  . lisinopril (PRINIVIL,ZESTRIL) 40 MG tablet Take 1 tablet (40 mg total) by mouth daily. 90 tablet 3  . rosuvastatin (CRESTOR) 5 MG tablet Take 1/2 tablet by mouth daily. 45 tablet 3   No current  facility-administered medications on file prior to visit.     Allergies  Allergen Reactions  . Prilosec [Omeprazole] Cough  . Amoxicillin     REACTION: rash  . Metronidazole     REACTION: rash  . Protonix [Pantoprazole Sodium] Swelling    Family History  Problem Relation Age of Onset  . Diabetes Brother   . Hypertension Brother   . Hypertension Father   . Stroke Father   . Diabetes Sister   . Hypertension Sister   . Heart disease Sister   . CAD Mother        Died of MI at age 65  . CAD Brother        Died of MI at age 32  . Colon cancer Neg Hx   . Hypercalcemia Neg Hx     BP (!) 152/72   Pulse 75   Wt 159 lb (72.1 kg)   SpO2 95%   BMI 29.08 kg/m     Review of Systems Denies chest pain and sob.     Objective:   Physical Exam VITAL SIGNS:  See vs page GENERAL: no distress Ext: trace bilat leg edema.   Lab Results  Component Value Date   PTH 90 (H) 10/17/2017   CALCIUM 10.5 (H) 10/17/2017       Assessment & Plan:  Primary hyperparathyroidism: stable Vitamin-D deficiency: recheck today HTN: borderline control.  same medication for now Edema: stable despite lasix  Patient Instructions  blood tests are requested for you today.  We'll let you know about the results. Unless the calcium goes over 12, you don't need surgery. Please continue the same medications Please come back for a follow-up appointment in 6 months.

## 2017-10-17 NOTE — Patient Instructions (Addendum)
blood tests are requested for you today.  We'll let you know about the results. Unless the calcium goes over 12, you don't need surgery. Please continue the same medications Please come back for a follow-up appointment in 6 months.

## 2017-10-18 LAB — PTH, INTACT AND CALCIUM
Calcium: 10.5 mg/dL — ABNORMAL HIGH (ref 8.6–10.4)
PTH: 90 pg/mL — AB (ref 14–64)

## 2017-10-23 ENCOUNTER — Encounter: Payer: Medicare Other | Admitting: Physical Therapy

## 2017-11-09 ENCOUNTER — Other Ambulatory Visit: Payer: Self-pay | Admitting: Family Medicine

## 2017-11-09 DIAGNOSIS — Z1231 Encounter for screening mammogram for malignant neoplasm of breast: Secondary | ICD-10-CM

## 2017-12-02 NOTE — Progress Notes (Signed)
HPI:  Using dictation device. Unfortunately this device frequently misinterprets words/phrases.  Robin [name] is a pleasant 84 y.o. here for follow up. Chronic medical problems summarized below were reviewed for changes and stability and were updated as needed below. These issues and their treatment remain stable for the most part.  Reports is doing okay for the most part.  She continues to have dizziness from time to time, she does not feel like pursuing this any further as she reports she has had an extensive evaluation with the neurologist, endocrinologist, cardiologist and has tried physical therapy.  Reports she has decided to just live with it.  Reports does not have vertigo, just mild dizziness at times.  Denies CP, SOB, DOE, treatment intolerance or new symptoms.  She requests refills on her Crestor, lisinopril, amlodipine, and Lasix.  She requests that we send them to her mail pharmacy Due for labs with AWV in October  AWV 02/13/17 HTN/Aortic atherosclerosis/mild AS: -sees cardiologist, Dr. [name] -she keeps home log, ambulatory BP better then office checks -lisinopril 40mg , norvasc 5mg  -endo had rxd lasixfor edema and calcium -she has a long list of antihypertensives she did not tolerate, is reluctant to take any of them  Dizziness/Balance issues/HEadaches: -neurology eval 11/10/16 - EEG (normal), MRIwith chronic sm vessel disease and asa was advised, CTA ok per neurology notes -neurology advised gabapentin for headaches -neurology referred to cardiology for eval, echo with mild AS (2018) per cardiology notes  HLD: -meds: crestor 2.5mg  daily, zetia 10mg  -hx statin intol  Hypercalcemia/Hyperparathyroidism/Vit D def -seeing endocrinologist -Vit D restarted by endo 2018  GERD: -she stopped taking her PPI, used to take nexium 66, reports takes rarely when has symptoms -hx hiatal hernia and gastritis  ROS: See pertinent positives and negatives per HPI.  Past Medical  History:  Diagnosis Date  . Asthma   . Colon polyps    tubulovillous adenoma, 2010  . External hemorrhoids without mention of complication   . Headache    eval with neuro in 2011  . Hyperlipemia   . Hyperparathyroidism [facility])    seeing Dr. Dwyane Dee in endocrinology  . Hypertension   . IBS (irritable bowel syndrome), GERD, hx colon polyps, chronic abd pain - followed by Dr. Sharlett Iles in GI 06/11/2012  . Osteoporosis 08/29/2011  . Vertigo    eval with neuro in 2011, brief recurrence 2016    Past Surgical History:  Procedure Laterality Date  . BACK SURGERY  1974  . CARPAL TUNNEL RELEASE  2/10  . TONSILLECTOMY      Family History  Problem Relation Age of Onset  . Diabetes Brother   . Hypertension Brother   . Hypertension Father   . Stroke Father   . Diabetes Sister   . Hypertension Sister   . Heart disease Sister   . CAD Mother        Died of MI at age 44  . CAD Brother        Died of MI at age 4  . Colon cancer Neg Hx   . Hypercalcemia Neg Hx     SOCIAL HX: See HPI   Current Outpatient Medications:  .  amLODipine (NORVASC) 5 MG tablet, Take 1 tablet (5 mg total) by mouth daily., Disp: 90 tablet, Rfl: 3 .  cetirizine (ZYRTEC) 10 MG tablet, Take 10 mg by mouth daily., Disp: , Rfl:  .  Cholecalciferol (VITAMIN D) 2000 units CAPS, Take 2,000 Units by mouth daily., Disp: , Rfl:  .  esomeprazole ([place])  40 MG capsule, Take 1 capsule (40 mg total) by mouth daily at 12 noon. (Patient taking differently: Take 40 mg by mouth daily as needed. ), Disp: 30 capsule, Rfl: 11 .  ezetimibe (ZETIA) 10 MG tablet, TAKE 1 BY MOUTH DAILY, Disp: 90 tablet, Rfl: 3 .  furosemide (LASIX) 20 MG tablet, Take 1 tablet (20 mg total) by mouth daily., Disp: 90 tablet, Rfl: 3 .  hydrocortisone (ANUSOL-HC) 2.5 % rectal cream, Apply to the rectal area 2-3 times daily as needed., Disp: 30 g, Rfl: 1 .  lisinopril (PRINIVIL,ZESTRIL) 40 MG tablet, Take 1 tablet (40 mg total) by mouth daily., Disp: 90 tablet,  Rfl: 3 .  rosuvastatin (CRESTOR) 5 MG tablet, Take 1/2 tablet by mouth daily., Disp: 45 tablet, Rfl: 3  EXAM:  Vitals:   12/03/17 0914  BP: 128/76  Pulse: 84  Temp: 98.2 F (36.8 C)    Body mass index is 29.28 kg/m.  GENERAL: vitals reviewed and listed above, alert, oriented, appears well hydrated and in no acute distress  HEENT: atraumatic, conjunttiva clear, no obvious abnormalities on inspection of external nose and ears  NECK: no obvious masses on inspection  LUNGS: clear to auscultation bilaterally, no wheezes, rales or rhonchi, good air movement  CV: HRRR, no peripheral edema  MS: moves all extremities without noticeable abnormality  PSYCH: pleasant and cooperative, no obvious depression or anxiety  ASSESSMENT AND PLAN:  Discussed the following assessment and plan:  Essential hypertension  HYPERCHOLESTEROLEMIA - Plan: rosuvastatin (CRESTOR) 5 MG tablet  Aortic atherosclerosis (HCC)  Hyperparathyroidism (HCC)  Dizziness  -Lifestyle recommendations -Continue current medications -Discussed alternative options for the treatment of her dizziness, since nothing else is seem to help, also discussed that she discussed possibility of this being related to her elevated calcium with her endocrinologist -She declined any further evaluation or treatment at this time for the symptoms -Refill sent to her mail pharmacy per her request -Due for her annual wellness visit in October, advised to schedule and will plan to check labs then -Patient advised to return sooner new concerns arise.  Patient Instructions  BEFORE YOU LEAVE: -follow up: AWV with Manuela Schwartz and f/u with Dr. Maudie Mercury in October  - labs then  I sent the refills you requested.   We recommend the following healthy lifestyle for LIFE: 1) Small portions. But, make sure to get regular (at least 3 per day), healthy meals and small healthy snacks if needed.  2) Eat a healthy clean diet.   TRY TO EAT: -at least 5-7  servings of low sugar, colorful, and nutrient rich vegetables per day (not corn, potatoes or bananas.) -berries are the best choice if you wish to eat fruit (only eat small amounts if trying to reduce weight)  -lean meets (fish, white meat of chicken or [place]) -vegan proteins for some meals - beans or tofu, whole grains, nuts and seeds -Replace bad fats with good fats - good fats include: fish, nuts and seeds, canola oil, olive oil -small amounts of low fat or non fat dairy -small amounts of100 % whole grains - check the lables -drink plenty of water  AVOID: -SUGAR, sweets, anything with added sugar, corn syrup or sweeteners - must read labels as even foods advertised as "healthy" often are loaded with sugar -if you must have a sweetener, small amounts of stevia may be best -sweetened beverages and artificially sweetened beverages -simple starches (rice, bread, potatoes, pasta, chips, etc - small amounts of 100% whole grains are ok) -  red meat, pork, butter -fried foods, fast food, processed food, excessive dairy, eggs and coconut.  3)Get at least 150 minutes of sweaty aerobic exercise per week.  4)Reduce stress - consider counseling, meditation and relaxation to balance other aspects of your life.     Lucretia Kern, DO

## 2017-12-03 ENCOUNTER — Encounter: Payer: Self-pay | Admitting: Family Medicine

## 2017-12-03 ENCOUNTER — Ambulatory Visit (INDEPENDENT_AMBULATORY_CARE_PROVIDER_SITE_OTHER): Payer: Medicare Other | Admitting: Family Medicine

## 2017-12-03 VITALS — BP 128/76 | HR 84 | Temp 98.2°F | Ht 62.0 in | Wt 160.1 lb

## 2017-12-03 DIAGNOSIS — R42 Dizziness and giddiness: Secondary | ICD-10-CM

## 2017-12-03 DIAGNOSIS — I700 Atherosclerosis of aorta: Secondary | ICD-10-CM | POA: Diagnosis not present

## 2017-12-03 DIAGNOSIS — E213 Hyperparathyroidism, unspecified: Secondary | ICD-10-CM | POA: Diagnosis not present

## 2017-12-03 DIAGNOSIS — I10 Essential (primary) hypertension: Secondary | ICD-10-CM

## 2017-12-03 DIAGNOSIS — E7800 Pure hypercholesterolemia, unspecified: Secondary | ICD-10-CM

## 2017-12-03 MED ORDER — LISINOPRIL 40 MG PO TABS: 40.0000 mg | ORAL_TABLET | Freq: Every day | ORAL | 3 refills | Status: DC

## 2017-12-03 MED ORDER — AMLODIPINE BESYLATE 5 MG PO TABS: 5.0000 mg | ORAL_TABLET | Freq: Every day | ORAL | 3 refills | Status: DC

## 2017-12-03 MED ORDER — ROSUVASTATIN CALCIUM 5 MG PO TABS: ORAL_TABLET | ORAL | 3 refills | Status: DC

## 2017-12-03 MED ORDER — FUROSEMIDE 20 MG PO TABS: 20.0000 mg | ORAL_TABLET | Freq: Every day | ORAL | 3 refills | Status: DC

## 2017-12-03 NOTE — Patient Instructions (Signed)
BEFORE YOU LEAVE: -follow up: AWV with Manuela Schwartz and f/u with Dr. Maudie Mercury in October  - labs then  I sent the refills you requested.   We recommend the following healthy lifestyle for LIFE: 1) Small portions. But, make sure to get regular (at least 3 per day), healthy meals and small healthy snacks if needed.  2) Eat a healthy clean diet.   TRY TO EAT: -at least 5-7 servings of low sugar, colorful, and nutrient rich vegetables per day (not corn, potatoes or bananas.) -berries are the best choice if you wish to eat fruit (only eat small amounts if trying to reduce weight)  -lean meets (fish, white meat of chicken or [place]) -vegan proteins for some meals - beans or tofu, whole grains, nuts and seeds -Replace bad fats with good fats - good fats include: fish, nuts and seeds, canola oil, olive oil -small amounts of low fat or non fat dairy -small amounts of100 % whole grains - check the lables -drink plenty of water  AVOID: -SUGAR, sweets, anything with added sugar, corn syrup or sweeteners - must read labels as even foods advertised as "healthy" often are loaded with sugar -if you must have a sweetener, small amounts of stevia may be best -sweetened beverages and artificially sweetened beverages -simple starches (rice, bread, potatoes, pasta, chips, etc - small amounts of 100% whole grains are ok) -red meat, pork, butter -fried foods, fast food, processed food, excessive dairy, eggs and coconut.  3)Get at least 150 minutes of sweaty aerobic exercise per week.  4)Reduce stress - consider counseling, meditation and relaxation to balance other aspects of your life.

## 2017-12-24 ENCOUNTER — Ambulatory Visit
Admission: RE | Admit: 2017-12-24 | Discharge: 2017-12-24 | Disposition: A | Discharge status: Home / Self Care | Payer: Medicare Other | Source: Ambulatory Visit | Attending: Family Medicine | Admitting: Family Medicine

## 2017-12-24 DIAGNOSIS — Z1231 Encounter for screening mammogram for malignant neoplasm of breast: Secondary | ICD-10-CM

## 2018-01-29 ENCOUNTER — Ambulatory Visit (INDEPENDENT_AMBULATORY_CARE_PROVIDER_SITE_OTHER): Payer: Medicare Other | Admitting: Physician Assistant

## 2018-01-29 ENCOUNTER — Encounter: Payer: Self-pay | Admitting: Physician Assistant

## 2018-01-29 ENCOUNTER — Telehealth: Payer: Self-pay | Admitting: Gastroenterology

## 2018-01-29 VITALS — BP 160/80 | HR 86 | Ht 62.0 in | Wt 157.0 lb

## 2018-01-29 DIAGNOSIS — R12 Heartburn: Secondary | ICD-10-CM

## 2018-01-29 DIAGNOSIS — R1013 Epigastric pain: Secondary | ICD-10-CM

## 2018-01-29 MED ORDER — ESOMEPRAZOLE MAGNESIUM 40 MG PO CPDR: 40.0000 mg | DELAYED_RELEASE_CAPSULE | Freq: Every day | ORAL | 2 refills | Status: DC

## 2018-01-29 MED ORDER — AMBULATORY NON FORMULARY MEDICATION: 1 refills | Status: DC

## 2018-01-29 NOTE — Patient Instructions (Signed)
We have sent the following medications to your pharmacy for you to pick up at your convenience:  Take Nexium 40 mg daily 30-60 minutes before breakfast  (you can take two of the 20 mg tablets in the AM if you have some left at home)  GI cocktail 5-10 mL every 4-6 hours as needed for pain.   Please call the office with an update of your symptoms on Friday and ask for Koren Shiver, RN, BSN.

## 2018-01-29 NOTE — Telephone Encounter (Signed)
Patient is having extreme abd pain and wants advice from the nurse.

## 2018-01-29 NOTE — Progress Notes (Signed)
Chief Complaint: GERD  HPI:    Robin [name] is an 84 year old female with a past medical history as listed below including IBS, who follows with Dr. Fuller Plan and presents to clinic today with an acute complaint of reflux.     03/22/2017 office visit with Dr. Fuller Plan to discuss chronic constipation, generalized abdominal discomfort and abdominal bloating.  At that time patient was started on Trulance 3 mg daily and MiraLAX daily-3 times daily titrated for complete daily movement.    EGD 11/23/2010 with mild gastritis in the antrum, hiatal hernia, esophagitis at the GE junction and normal duodenal folds.  Pathology showed chronic gastritis with no H. pylori or intestinal metaplasia.    12/03/2017 saw PCP for follow-up office visit.  At that time discussed reflux and told them she stopped taking her PPI, apparently reported to using Nexium 40 mg only rarely for occasional symptoms.    Today, patient presents to clinic accompanied by her daughter and tells me that for the past couple of weeks she has had increased heartburn.  She has been telling her daughter that whenever she eats it hurts her stomach.  Describes epigastric pain can radiate into her left upper quadrant, and burning.  Denies any reflux symptoms.  Last night she woke around midnight and could not but get back to sleep due to severe epigastric pain which seemed to radiate up into her chest.  She finally took some Gaviscon around 2 AM and was able to go back to sleep.  Her daughter tells me she was close to taking her to the ER due to chest pain.  Patient tells me that she uses over-the-counter Nexium as needed for her heartburn symptoms but over the past 2 weeks has been using this once daily.  Does not believe it has made much change.    Also describes occasional shortness of breath when sitting still over the past few months, has not had this occur in the past 3 days or so.    Denies fever, chills, chest pain with activity, dizziness, change in  bowel habits, bright red blood in her stool or melena.  Past Medical History:  Diagnosis Date  . Asthma   . Colon polyps    tubulovillous adenoma, 2010  . External hemorrhoids without mention of complication   . Headache    eval with neuro in 2011  . Hyperlipemia   . Hyperparathyroidism [facility])    seeing Dr. Dwyane Dee in endocrinology  . Hypertension   . IBS (irritable bowel syndrome), GERD, hx colon polyps, chronic abd pain - followed by Dr. Sharlett Iles in GI 06/11/2012  . Osteoporosis 08/29/2011  . Vertigo    eval with neuro in 2011, brief recurrence 2016    Past Surgical History:  Procedure Laterality Date  . BACK SURGERY  1974  . CARPAL TUNNEL RELEASE  2/10  . TONSILLECTOMY      Current Outpatient Medications  Medication Sig Dispense Refill  . amLODipine (NORVASC) 5 MG tablet Take 1 tablet (5 mg total) by mouth daily. 90 tablet 3  . cetirizine (ZYRTEC) 10 MG tablet Take 10 mg by mouth daily.    . Cholecalciferol (VITAMIN D) 2000 units CAPS Take 2,000 Units by mouth daily.    Marland Kitchen esomeprazole (NEXIUM) 40 MG capsule Take 1 capsule (40 mg total) by mouth daily at 12 noon. (Patient taking differently: Take 40 mg by mouth daily as needed. ) 30 capsule 11  . ezetimibe (ZETIA) 10 MG tablet TAKE 1 BY MOUTH DAILY  90 tablet 3  . furosemide (LASIX) 20 MG tablet Take 1 tablet (20 mg total) by mouth daily. 90 tablet 3  . hydrocortisone (ANUSOL-HC) 2.5 % rectal cream Apply to the rectal area 2-3 times daily as needed. 30 g 1  . lisinopril (PRINIVIL,ZESTRIL) 40 MG tablet Take 1 tablet (40 mg total) by mouth daily. 90 tablet 3  . rosuvastatin (CRESTOR) 5 MG tablet Take 1/2 tablet by mouth daily. 45 tablet 3   No current facility-administered medications for this visit.     Allergies as of 01/29/2018 - Review Complete 12/03/2017  Allergen Reaction Noted  . Prilosec [omeprazole] Cough 08/16/2016  . Amoxicillin  04/28/2009  . Metronidazole  04/28/2009  . Protonix [pantoprazole sodium] Swelling  03/14/2016    Family History  Problem Relation Age of Onset  . Diabetes Brother   . Hypertension Brother   . Hypertension Father   . Stroke Father   . Diabetes Sister   . Hypertension Sister   . Heart disease Sister   . CAD Mother        Died of MI at age 4  . CAD Brother        Died of MI at age 52  . Colon cancer Neg Hx   . Hypercalcemia Neg Hx     Social History   Socioeconomic History  . Marital status: Widowed    Spouse name: Not on file  . Number of children: 2  . Years of education: Not on file  . Highest education level: Not on file  Occupational History  . Occupation: retired    Fish farm manager: RETIRED  Social Needs  . Financial resource strain: Not on file  . Food insecurity:    Worry: Not on file    Inability: Not on file  . Transportation needs:    Medical: Not on file    Non-medical: Not on file  Tobacco Use  . Smoking status: Never Smoker  . Smokeless tobacco: Never Used  Substance and Sexual Activity  . Alcohol use: No    Alcohol/week: 0.0 standard drinks  . Drug use: No  . Sexual activity: Not on file  Lifestyle  . Physical activity:    Days per week: Not on file    Minutes per session: Not on file  . Stress: Not on file  Relationships  . Social connections:    Talks on phone: Not on file    Gets together: Not on file    Attends religious service: Not on file    Active member of club or organization: Not on file    Attends meetings of clubs or organizations: Not on file    Relationship status: Not on file  . Intimate partner violence:    Fear of current or ex partner: Not on file    Emotionally abused: Not on file    Physically abused: Not on file    Forced sexual activity: Not on file  Other Topics Concern  . Not on file  Social History Narrative   Lives alone in a one story home.  Has 2 children.  Retired from working for a bank.  Education: high school.     Review of Systems:    Constitutional: No weight loss, fever or  chills Cardiovascular: +atypical chest pain Respiratory:+intermittent SOB Gastrointestinal: See HPI and otherwise negative   Physical Exam:  Vital signs: BP (!) 160/80   Pulse 86   Ht 5\' 2"  (1.575 m)   Wt 157 lb (71.2 kg)  SpO2 96%   BMI 28.72 kg/m   Constitutional:   Pleasant Elderly Caucasian female appears to be in NAD, Well developed, Well nourished, alert and cooperative Respiratory: Respirations even and unlabored. Lungs clear to auscultation bilaterally.   No wheezes, crackles, or rhonchi.  Cardiovascular: Normal S1, S2. No MRG. Regular rate and rhythm. No peripheral edema, cyanosis or pallor.  Gastrointestinal:  Soft, nondistended, mild epigastric ttp, No rebound or guarding. Normal bowel sounds. No appreciable masses or hepatomegaly. Psychiatric: Demonstrates good judgement and reason without abnormal affect or behaviors.  No recent labs or imaging.  Assessment: 1.  Epigastric pain: Worse over the past 2 weeks, likely related to gastritis/GERD 2.  Heartburn: Worse over the past 2 weeks, history of chronic gastritis  Plan: 1.  Prescribed Nexium 40 mg daily, 30 to 60 minutes before breakfast.  #30 with 1 refill 2.  Prescribed GI cocktail 5-10 mL's every 4-6 hours as needed for breakthrough abdominal pain and symptoms 3.  Discussed with the patient that if her symptoms do not feel under better control by Friday she should call our office.  If she continues with symptoms would recommend that she increase her Nexium to twice daily and/or add Zantac 150 mg at night. 4.  Reviewed antireflux diet and lifestyle modifications. 5.  Patient to follow with me in 4-6 weeks.  At that time we will discuss what she needs to do going forward.  Robin Newer, PA-C [facility] 01/29/2018, 10:51 AM  Cc: Lucretia Kern, DO

## 2018-01-29 NOTE — Telephone Encounter (Signed)
Patient reports terrible heartburn and pressure in chest and abdomen.  Her voice is hoarse from the acid last night.  She will come in and see Ellouise Newer, PA Today at 10:45

## 2018-01-29 NOTE — Progress Notes (Signed)
Reviewed and agree with initial management plan.  Bryella Diviney T. Yazleemar Strassner, MD FACG 

## 2018-02-01 ENCOUNTER — Telehealth: Payer: Self-pay

## 2018-02-01 ENCOUNTER — Other Ambulatory Visit: Payer: Self-pay

## 2018-02-01 MED ORDER — ESOMEPRAZOLE MAGNESIUM 40 MG PO CPDR: 40.0000 mg | DELAYED_RELEASE_CAPSULE | Freq: Two times a day (BID) | ORAL | 3 refills | Status: DC

## 2018-02-01 NOTE — Telephone Encounter (Signed)
Please have her increase Nexium 40mg  to BID. Can she call back in next week to let us know how she is doing. Thx! JLL

## 2018-02-01 NOTE — Telephone Encounter (Signed)
The patient has been notified of this information and all questions answered. The pt has been advised of the information and verbalized understanding.    

## 2018-02-01 NOTE — Telephone Encounter (Signed)
The pt was advised to call our office with an update on symptoms.  She states she is much better than she was at the office visit.  She has been using GI cocktail every 4 hours and at the end of the 4 th hour her symptoms return. She is on Nexium 40 mg daily as well. She is following anti-reflux precautions. Please advise    "Assessment: 1.  Epigastric pain: Worse over the past 2 weeks, likely related to gastritis/GERD 2.  Heartburn: Worse over the past 2 weeks, history of chronic gastritis  Plan: 1.  Prescribed Nexium 40 mg daily, 30 to 60 minutes before breakfast.  #30 with 1 refill 2.  Prescribed GI cocktail 5-10 mL's every 4-6 hours as needed for breakthrough abdominal pain and symptoms 3.  Discussed with the patient that if her symptoms do not feel under better control by Friday she should call our office.  If she continues with symptoms would recommend that she increase her Nexium to twice daily and/or add Zantac 150 mg at night. 4.  Reviewed antireflux diet and lifestyle modifications. 5.  Patient to follow with me in 4-6 weeks.  At that time we will discuss what she needs to do going forward.  Ellouise Newer, PA-C [facility] 02/01/2018, 9:25 AM

## 2018-02-12 ENCOUNTER — Telehealth: Payer: Self-pay | Admitting: Physician Assistant

## 2018-02-12 NOTE — Telephone Encounter (Signed)
The pt states the epigastric burning is better but she still has pain. Her appt with Anderson Malta was on 10/22.  Appt was moved up to 10/9.

## 2018-02-20 ENCOUNTER — Encounter: Payer: Self-pay | Admitting: Physician Assistant

## 2018-02-20 ENCOUNTER — Ambulatory Visit (INDEPENDENT_AMBULATORY_CARE_PROVIDER_SITE_OTHER): Payer: Medicare Other | Admitting: Physician Assistant

## 2018-02-20 VITALS — BP 160/70 | HR 84 | Ht 62.0 in | Wt 159.0 lb

## 2018-02-20 DIAGNOSIS — R1013 Epigastric pain: Secondary | ICD-10-CM

## 2018-02-20 DIAGNOSIS — R1310 Dysphagia, unspecified: Secondary | ICD-10-CM

## 2018-02-20 DIAGNOSIS — R12 Heartburn: Secondary | ICD-10-CM

## 2018-02-20 MED ORDER — ESOMEPRAZOLE MAGNESIUM 40 MG PO CPDR: 40.0000 mg | DELAYED_RELEASE_CAPSULE | Freq: Two times a day (BID) | ORAL | 3 refills | Status: DC

## 2018-02-20 NOTE — Progress Notes (Signed)
Reviewed and agree with initial management plan.  Taesha Goodell T. Waseem Suess, MD FACG 

## 2018-02-20 NOTE — Progress Notes (Signed)
Chief Complaint: Epigastric pain, heartburn and dysphagia  HPI:    Robin [name] is an 84 year old female with a past medical history as listed below including IBS, follows with Dr. Fuller Plan and returns clinic today for follow-up of her epigastric pain heartburn and dysphagia.    01/29/2018 office visit with me.  A previous EGD from 2012 was reviewed which showed mild gastritis in antrum, hiatal hernia and esophagitis of the GE junction.  Pathology showed chronic gastritis.  At that visit patient described an epigastric pain and heartburn which had increased over the past couple of weeks.  She was prescribed Nexium 40 mg daily and a GI cocktail as needed.    Today, patient expresses that her symptoms are 75% better.  She has continued her Nexium 40 mg daily which does help with all for heartburn but she continues with an epigastric pain on most days which is some better if she takes some GI cocktail, but "as soon as it wears off it comes back".  Also today patient describes that when she swallows things such as bread it seems to "hang" in her throat on the way down.  Patient is happy that things are getting better but is not completely relieved of her symptoms.    Denies fever, chills, weight loss, anorexia, nausea, vomiting, change in bowel habits or symptoms that awaken her from sleep.  Past Medical History:  Diagnosis Date  . Asthma   . Colon polyps    tubulovillous adenoma, 2010  . External hemorrhoids without mention of complication   . Headache    eval with neuro in 2011  . Hyperlipemia   . Hyperparathyroidism [facility])    seeing Dr. Dwyane Dee in endocrinology  . Hypertension   . IBS (irritable bowel syndrome), GERD, hx colon polyps, chronic abd pain - followed by Dr. Sharlett Iles in GI 06/11/2012  . Osteoporosis 08/29/2011  . Vertigo    eval with neuro in 2011, brief recurrence 2016    Past Surgical History:  Procedure Laterality Date  . BACK SURGERY  1974  . CARPAL TUNNEL RELEASE  2/10  .  TONSILLECTOMY      Current Outpatient Medications  Medication Sig Dispense Refill  . AMBULATORY NON FORMULARY MEDICATION Medication Name: GI cocktail 5-10 ML every 4-6 hours as needed for pain 450 mL 1  . amLODipine (NORVASC) 5 MG tablet Take 1 tablet (5 mg total) by mouth daily. 90 tablet 3  . cetirizine (ZYRTEC) 10 MG tablet Take 10 mg by mouth daily.    . Cholecalciferol (VITAMIN D) 2000 units CAPS Take 2,000 Units by mouth daily.    Marland Kitchen esomeprazole (NEXIUM) 40 MG capsule Take 40 mg by mouth daily before breakfast.    . ezetimibe (ZETIA) 10 MG tablet TAKE 1 BY MOUTH DAILY 90 tablet 3  . furosemide (LASIX) 20 MG tablet Take 1 tablet (20 mg total) by mouth daily. 90 tablet 3  . hydrocortisone (ANUSOL-HC) 2.5 % rectal cream Apply to the rectal area 2-3 times daily as needed. 30 g 1  . lisinopril (PRINIVIL,ZESTRIL) 40 MG tablet Take 1 tablet (40 mg total) by mouth daily. 90 tablet 3  . rosuvastatin (CRESTOR) 5 MG tablet Take 1/2 tablet by mouth daily. 45 tablet 3   No current facility-administered medications for this visit.     Allergies as of 02/20/2018 - Review Complete 02/20/2018  Allergen Reaction Noted  . Prilosec [omeprazole] Cough 08/16/2016  . Amoxicillin  04/28/2009  . Metronidazole  04/28/2009  . Protonix [pantoprazole  sodium] Swelling 03/14/2016    Family History  Problem Relation Age of Onset  . Diabetes Brother   . Hypertension Brother   . Hypertension Father   . Stroke Father   . Diabetes Sister   . Hypertension Sister   . Heart disease Sister   . CAD Mother        Died of MI at age 71  . CAD Brother        Died of MI at age 2  . Colon cancer Neg Hx   . Hypercalcemia Neg Hx     Social History   Socioeconomic History  . Marital status: Widowed    Spouse name: Not on file  . Number of children: 2  . Years of education: Not on file  . Highest education level: Not on file  Occupational History  . Occupation: retired    Fish farm manager: RETIRED  Social Needs    . Financial resource strain: Not on file  . Food insecurity:    Worry: Not on file    Inability: Not on file  . Transportation needs:    Medical: Not on file    Non-medical: Not on file  Tobacco Use  . Smoking status: Never Smoker  . Smokeless tobacco: Never Used  Substance and Sexual Activity  . Alcohol use: No    Alcohol/week: 0.0 standard drinks  . Drug use: No  . Sexual activity: Not on file  Lifestyle  . Physical activity:    Days per week: Not on file    Minutes per session: Not on file  . Stress: Not on file  Relationships  . Social connections:    Talks on phone: Not on file    Gets together: Not on file    Attends religious service: Not on file    Active member of club or organization: Not on file    Attends meetings of clubs or organizations: Not on file    Relationship status: Not on file  . Intimate partner violence:    Fear of current or ex partner: Not on file    Emotionally abused: Not on file    Physically abused: Not on file    Forced sexual activity: Not on file  Other Topics Concern  . Not on file  Social History Narrative   Lives alone in a one story home.  Has 2 children.  Retired from working for a bank.  Education: high school.     Review of Systems:    Constitutional: No weight loss, fever or chills Cardiovascular: No chest pain Respiratory: No SOB  Gastrointestinal: See HPI and otherwise negative   Physical Exam:  Vital signs: BP (!) 160/70   Pulse 84   Ht 5\' 2"  (1.575 m)   Wt 159 lb (72.1 kg)   BMI 29.08 kg/m   Constitutional:   Pleasant Caucasian female appears to be in NAD, Well developed, Well nourished, alert and cooperative Respiratory: Respirations even and unlabored. Lungs clear to auscultation bilaterally.   No wheezes, crackles, or rhonchi.  Cardiovascular: Normal S1, S2. No MRG. Regular rate and rhythm. No peripheral edema, cyanosis or pallor.  Gastrointestinal:  Soft, nondistended, nontender. No rebound or guarding.  Normal bowel sounds. No appreciable masses or hepatomegaly. Psychiatric: Demonstrates good judgement and reason without abnormal affect or behaviors.  No recent labs or imaging.  Assessment: 1.  Epigastric pain: Continues occasionally, some better with GI cocktail; likely related to gastritis/GERD 2.  Heartburn: Better with Nexium 40 mg daily 3.  Dysphagia: Breads are getting caught in the patient's throat, inconsistently, less than once a week; consider esophageal stricture versus dysmotility versus other  Plan: 1.  Ordered a barium swallow with tablet.  Pending this could consider an EGD. 2.  Increased Nexium to 40 mg twice daily, 30-60 minutes before breakfast and dinner.  #60 with 1 refill.  Discussed with patient that we will leave her on this for 30 to 60 days and then try and titrate back down.  Patient is nervous about being on a high dose of medicine. 3.  Continue GI cocktail as needed 4.  Reviewed anti-dysphagia measures including taking small bites, chewing well and avoiding distraction while eating. 5.  Patient to follow in clinic with me in 4 to 6 weeks.  At that time we will discuss decreasing Nexium, pending symptoms.  Ellouise Newer, PA-C [facility] 02/20/2018, 9:27 AM  Cc: Lucretia Kern, DO

## 2018-02-20 NOTE — Patient Instructions (Signed)
We have sent the following medications to your pharmacy for you to pick up at your convenience: Increase Nexium to 40 mg twice a day before breakfast and dinner  You have been scheduled for a Barium Esophogram at [facility] (1st floor of thebuilding) on 02-25-18 at 8:30 am. Please arrive 15 minutes prior to your appointment for registration. Make certain not to have anything to eat or drink 3 hours prior to your test. If you need to reschedule for any reason, please call [phone]. __________________________________________________________________ A barium swallow is an examination that concentrates on views of the esophagus. This tends to be a double contrast exam (barium and two liquids which, when combined, create a gas to distend the wall of the oesophagus) or single contrast (non-ionic iodine based). The study is usually tailored to your symptoms so a good history is essential. Attention is paid during the study to the form, structure and configuration of the esophagus, looking for functional disorders (such as aspiration, dysphagia, achalasia, motility and reflux) EXAMINATION You may be asked to change into a gown, depending on the type of swallow being performed. A radiologist and radiographer will perform the procedure. The radiologist will advise you of the type of contrast selected for your procedure and direct you during the exam. You will be asked to stand, sit or lie in several different positions and to hold a small amount of fluid in your mouth before being asked to swallow while the imaging is performed .In some instances you may be asked to swallow barium coated marshmallows to assess the motility of a solid food bolus. The exam can be recorded as a digital or video fluoroscopy procedure. POST PROCEDURE It will take 1-2 days for the barium to pass through your system. To facilitate this, it is important, unless otherwise directed, to increase your fluids for the next 24-48hrs  and to resume your normal diet.  This test typically takes about 30 minutes to perform. __________________________________________________________________________________

## 2018-02-25 ENCOUNTER — Ambulatory Visit
Admission: RE | Admit: 2018-02-25 | Discharge: 2018-02-25 | Disposition: A | Discharge status: Home / Self Care | Payer: Medicare Other | Source: Ambulatory Visit | Attending: Physician Assistant | Admitting: Physician Assistant

## 2018-02-25 DIAGNOSIS — R1310 Dysphagia, unspecified: Secondary | ICD-10-CM

## 2018-02-25 DIAGNOSIS — R1013 Epigastric pain: Secondary | ICD-10-CM

## 2018-02-25 DIAGNOSIS — K449 Diaphragmatic hernia without obstruction or gangrene: Secondary | ICD-10-CM | POA: Diagnosis not present

## 2018-03-04 ENCOUNTER — Ambulatory Visit: Payer: Medicare Other | Admitting: Family Medicine

## 2018-03-05 ENCOUNTER — Ambulatory Visit: Payer: Medicare Other | Admitting: Physician Assistant

## 2018-03-11 ENCOUNTER — Encounter: Payer: Self-pay | Admitting: Family Medicine

## 2018-03-11 ENCOUNTER — Ambulatory Visit: Payer: Medicare Other

## 2018-03-11 ENCOUNTER — Ambulatory Visit (INDEPENDENT_AMBULATORY_CARE_PROVIDER_SITE_OTHER): Payer: Medicare Other | Admitting: Family Medicine

## 2018-03-11 VITALS — BP 122/80 | HR 75 | Temp 98.2°F | Ht 62.0 in | Wt 161.9 lb

## 2018-03-11 DIAGNOSIS — Z23 Encounter for immunization: Secondary | ICD-10-CM

## 2018-03-11 DIAGNOSIS — R1310 Dysphagia, unspecified: Secondary | ICD-10-CM | POA: Diagnosis not present

## 2018-03-11 DIAGNOSIS — E213 Hyperparathyroidism, unspecified: Secondary | ICD-10-CM | POA: Diagnosis not present

## 2018-03-11 DIAGNOSIS — I10 Essential (primary) hypertension: Secondary | ICD-10-CM | POA: Diagnosis not present

## 2018-03-11 DIAGNOSIS — E7800 Pure hypercholesterolemia, unspecified: Secondary | ICD-10-CM | POA: Diagnosis not present

## 2018-03-11 LAB — CBC
HCT: 40 % (ref 36.0–46.0)
Hemoglobin: 13.5 g/dL (ref 12.0–15.0)
MCHC: 33.6 g/dL (ref 30.0–36.0)
MCV: 91.8 fl (ref 78.0–100.0)
Platelets: 285 K/uL (ref 150.0–400.0)
RBC: 4.36 Mil/uL (ref 3.87–5.11)
RDW: 13.2 % (ref 11.5–15.5)
WBC: 5.9 K/uL (ref 4.0–10.5)

## 2018-03-11 LAB — LIPID PANEL
Cholesterol: 197 mg/dL (ref 0–200)
HDL: 64.8 mg/dL
LDL Cholesterol: 111 mg/dL — ABNORMAL HIGH (ref 0–99)
NonHDL: 131.85
Total CHOL/HDL Ratio: 3
Triglycerides: 103 mg/dL (ref 0.0–149.0)
VLDL: 20.6 mg/dL (ref 0.0–40.0)

## 2018-03-11 LAB — BASIC METABOLIC PANEL
BUN: 18 mg/dL (ref 6–23)
CALCIUM: 10.8 mg/dL — AB (ref 8.4–10.5)
CHLORIDE: 103 meq/L (ref 96–112)
CO2: 29 meq/L (ref 19–32)
CREATININE: 0.81 mg/dL (ref 0.40–1.20)
GFR: 71.91 mL/min (ref >60.00)
Glucose, Bld: 90 mg/dL (ref 70–99)
POTASSIUM: 4.4 meq/L (ref 3.5–5.1)
Sodium: 140 mEq/L (ref 135–145)

## 2018-03-11 NOTE — Addendum Note (Signed)
Addended by: Agnes Lawrence on: 03/11/2018 11:08 AM   Modules accepted: Orders

## 2018-03-11 NOTE — Patient Instructions (Signed)
BEFORE YOU LEAVE: -flu shot -labs -follow up: AWV when convenient in next 2 months; follow up Dr. Maudie Mercury 4 months  We have ordered labs or studies at this visit. It can take up to 1-2 weeks for results and processing. IF results require follow up or explanation, we will call you with instructions. Clinically stable results will be released to your [facility]. If you have not heard from [place] or cannot find your results in [facility] in 2 weeks please contact our office at [phone].  If you are not yet signed up for [facility], please consider signing up.    We recommend the following healthy lifestyle for LIFE: 1) Small portions. But, make sure to get regular (at least 3 per day), healthy meals and small healthy snacks if needed.  2) Eat a healthy clean diet.   TRY TO EAT: -at least 5-7 servings of low sugar, colorful, and nutrient rich vegetables per day (not corn, potatoes or bananas.) -berries are the best choice if you wish to eat fruit (only eat small amounts if trying to reduce weight)  -lean meets (fish, white meat of chicken or [place]) -vegan proteins for some meals - beans or tofu, whole grains, nuts and seeds -Replace bad fats with good fats - good fats include: fish, nuts and seeds, canola oil, olive oil -small amounts of low fat or non fat dairy -small amounts of100 % whole grains - check the lables -drink plenty of water  AVOID: -SUGAR, sweets, anything with added sugar, corn syrup or sweeteners - must read labels as even foods advertised as "healthy" often are loaded with sugar -if you must have a sweetener, small amounts of stevia may be best -sweetened beverages and artificially sweetened beverages -simple starches (rice, bread, potatoes, pasta, chips, etc - small amounts of 100% whole grains are ok) -red meat, pork, butter -fried foods, fast food, processed food, excessive dairy, eggs and coconut.  3)Get at least 150 minutes of sweaty aerobic exercise per week.  4)Reduce  stress - consider counseling, meditation and relaxation to balance other aspects of your life.

## 2018-03-11 NOTE — Progress Notes (Signed)
HPI:  Using dictation device. Unfortunately this device frequently misinterprets words/phrases.  Robin [name] is a pleasant 84 y.o. here for follow up. Chronic medical problems summarized below were reviewed for changes and stability and were updated as needed below. These issues and their treatment remain stable for the most part.  Seeing GI for esophageal dilation next week per her report, Dr. Fuller Plan. Has had some GERD and dysphagia. Not eating as healthy and not really motivated to do so. No regular exercise. Enjoys the food she eats.  Denies CP, SOB, DOE, treatment intolerance or new symptoms.  AWV 02/13/17 HTN/Aortic atherosclerosis/mild AS: -sees cardiologist, Dr. [name] -she keeps home log, ambulatory BP better then office checks -lisinopril 40mg , norvasc 5mg  -endo had rxd lasixfor edema and calcium -she has a long list of antihypertensives she did not tolerate, is reluctant to take any of them  Dizziness/Balance issues/HEadaches: -neurology eval 11/10/16 - EEG (normal), MRIwith chronic sm vessel disease and asa was advised, CTA ok per neurology notes -neurology advised gabapentin for headaches -neurology referred to cardiology for eval, echo with mild AS (2018) per cardiology notes  HLD: -meds: crestor 2.5mg  daily, zetia 10mg  -hx statin intol  Hypercalcemia/Hyperparathyroidism/Vit D def -seeing endocrinologist -Vit D restarted by endo 2018  GERD: -she stopped taking her PPI, used to take nexium 40, reports takes rarely when has symptoms -hx esophageal stricture in 2019 - saw Dr. Fuller Plan -hx hiatal hernia and gastritis  ROS: See pertinent positives and negatives per HPI.  Past Medical History:  Diagnosis Date  . Asthma   . Colon polyps    tubulovillous adenoma, 2010  . External hemorrhoids without mention of complication   . Headache    eval with neuro in 2011  . Hyperlipemia   . Hyperparathyroidism [facility])    seeing Dr. Dwyane Dee in endocrinology  .  Hypertension   . IBS (irritable bowel syndrome), GERD, hx colon polyps, chronic abd pain - followed by Dr. Sharlett Iles in GI 06/11/2012  . Osteoporosis 08/29/2011  . Vertigo    eval with neuro in 2011, brief recurrence 2016    Past Surgical History:  Procedure Laterality Date  . BACK SURGERY  1974  . CARPAL TUNNEL RELEASE  2/10  . TONSILLECTOMY      Family History  Problem Relation Age of Onset  . Diabetes Brother   . Hypertension Brother   . Hypertension Father   . Stroke Father   . Diabetes Sister   . Hypertension Sister   . Heart disease Sister   . CAD Mother        Died of MI at age 27  . CAD Brother        Died of MI at age 39  . Colon cancer Neg Hx   . Hypercalcemia Neg Hx     SOCIAL HX: see hpi   Current Outpatient Medications:  .  AMBULATORY NON FORMULARY MEDICATION, Medication Name: GI cocktail 5-10 ML every 4-6 hours as needed for pain, Disp: 450 mL, Rfl: 1 .  amLODipine (NORVASC) 5 MG tablet, Take 1 tablet (5 mg total) by mouth daily., Disp: 90 tablet, Rfl: 3 .  cetirizine (ZYRTEC) 10 MG tablet, Take 10 mg by mouth daily., Disp: , Rfl:  .  Cholecalciferol (VITAMIN D) 2000 units CAPS, Take 2,000 Units by mouth daily., Disp: , Rfl:  .  esomeprazole (NEXIUM) 40 MG capsule, Take 1 capsule (40 mg total) by mouth 2 (two) times daily before a meal., Disp: 60 capsule, Rfl: 3 .  ezetimibe (  ZETIA) 10 MG tablet, TAKE 1 BY MOUTH DAILY, Disp: 90 tablet, Rfl: 3 .  furosemide (LASIX) 20 MG tablet, Take 1 tablet (20 mg total) by mouth daily., Disp: 90 tablet, Rfl: 3 .  hydrocortisone (ANUSOL-HC) 2.5 % rectal cream, Apply to the rectal area 2-3 times daily as needed., Disp: 30 g, Rfl: 1 .  lisinopril (PRINIVIL,ZESTRIL) 40 MG tablet, Take 1 tablet (40 mg total) by mouth daily., Disp: 90 tablet, Rfl: 3 .  rosuvastatin (CRESTOR) 5 MG tablet, Take 1/2 tablet by mouth daily., Disp: 45 tablet, Rfl: 3  EXAM:  Vitals:   03/11/18 1000  BP: 122/80  Pulse: 75  Temp: 98.2 F (36.8 C)     Body mass index is 29.61 kg/m.  GENERAL: vitals reviewed and listed above, alert, oriented, appears well hydrated and in no acute distress  HEENT: atraumatic, conjunttiva clear, no obvious abnormalities on inspection of external nose and ears  NECK: no obvious masses on inspection  LUNGS: clear to auscultation bilaterally, no wheezes, rales or rhonchi, good air movement  CV: HRRR, no peripheral edema  MS: moves all extremities without noticeable abnormality  PSYCH: pleasant and cooperative, no obvious depression or anxiety  ASSESSMENT AND PLAN:  Discussed the following assessment and plan:  Essential hypertension - Plan: Basic metabolic panel, CBC  HYPERCHOLESTEROLEMIA - Plan: Lipid panel  Hyperparathyroidism ([place])  Dysphagia, unspecified type  -flu shot per her wishes today -labs per orders -lifestyle recommendations/discussion -seeing GI for dysphagia/esophageal stricture -due for AWV - advised to schedule   Patient Instructions  BEFORE YOU LEAVE: -flu shot -labs -follow up: AWV when convenient in next 2 months; follow up Dr. Maudie Mercury 4 months  We have ordered labs or studies at this visit. It can take up to 1-2 weeks for results and processing. IF results require follow up or explanation, we will call you with instructions. Clinically stable results will be released to your [facility]. If you have not heard from [place] or cannot find your results in [facility] in 2 weeks please contact our office at [phone].  If you are not yet signed up for [facility], please consider signing up.    We recommend the following healthy lifestyle for LIFE: 1) Small portions. But, make sure to get regular (at least 3 per day), healthy meals and small healthy snacks if needed.  2) Eat a healthy clean diet.   TRY TO EAT: -at least 5-7 servings of low sugar, colorful, and nutrient rich vegetables per day (not corn, potatoes or bananas.) -berries are the best choice if you wish to eat fruit  (only eat small amounts if trying to reduce weight)  -lean meets (fish, white meat of chicken or [place]) -vegan proteins for some meals - beans or tofu, whole grains, nuts and seeds -Replace bad fats with good fats - good fats include: fish, nuts and seeds, canola oil, olive oil -small amounts of low fat or non fat dairy -small amounts of100 % whole grains - check the lables -drink plenty of water  AVOID: -SUGAR, sweets, anything with added sugar, corn syrup or sweeteners - must read labels as even foods advertised as "healthy" often are loaded with sugar -if you must have a sweetener, small amounts of stevia may be best -sweetened beverages and artificially sweetened beverages -simple starches (rice, bread, potatoes, pasta, chips, etc - small amounts of 100% whole grains are ok) -red meat, pork, butter -fried foods, fast food, processed food, excessive dairy, eggs and coconut.  3)Get at least 150  minutes of sweaty aerobic exercise per week.  4)Reduce stress - consider counseling, meditation and relaxation to balance other aspects of your life.         Lucretia Kern, DO

## 2018-03-12 ENCOUNTER — Other Ambulatory Visit: Payer: Self-pay

## 2018-03-12 ENCOUNTER — Ambulatory Visit (AMBULATORY_SURGERY_CENTER): Payer: Self-pay | Admitting: *Deleted

## 2018-03-12 VITALS — Ht 62.0 in | Wt 162.0 lb

## 2018-03-12 DIAGNOSIS — R1310 Dysphagia, unspecified: Secondary | ICD-10-CM

## 2018-03-12 NOTE — Progress Notes (Signed)
No egg or soy allergy known to patient  No issues with past sedation with any surgeries  or procedures, no intubation problems  No diet pills per patient No home 02 use per patient  No blood thinners per patient  No A fib or A flutter  EMMI video sent to daughters email e mail

## 2018-03-14 ENCOUNTER — Telehealth: Payer: Self-pay | Admitting: *Deleted

## 2018-03-14 NOTE — Telephone Encounter (Signed)
Copied from [place]. Topic: General - Other >> Mar 14, 2018  9:09 AM Ivar Drape wrote: Reason for CRM:   Patient would like a call back to discuss her recent labs

## 2018-03-14 NOTE — Telephone Encounter (Signed)
Spoke with patient and reviewed lab results. 

## 2018-03-18 ENCOUNTER — Ambulatory Visit (AMBULATORY_SURGERY_CENTER): Payer: Medicare Other | Admitting: Gastroenterology

## 2018-03-18 ENCOUNTER — Encounter: Payer: Self-pay | Admitting: Gastroenterology

## 2018-03-18 VITALS — BP 140/74 | HR 62 | Temp 98.4°F | Resp 12 | Ht 62.0 in | Wt 161.0 lb

## 2018-03-18 DIAGNOSIS — R933 Abnormal findings on diagnostic imaging of other parts of digestive tract: Secondary | ICD-10-CM

## 2018-03-18 DIAGNOSIS — K222 Esophageal obstruction: Secondary | ICD-10-CM

## 2018-03-18 DIAGNOSIS — K219 Gastro-esophageal reflux disease without esophagitis: Secondary | ICD-10-CM

## 2018-03-18 DIAGNOSIS — E669 Obesity, unspecified: Secondary | ICD-10-CM | POA: Diagnosis not present

## 2018-03-18 DIAGNOSIS — R42 Dizziness and giddiness: Secondary | ICD-10-CM | POA: Diagnosis not present

## 2018-03-18 DIAGNOSIS — R1310 Dysphagia, unspecified: Secondary | ICD-10-CM | POA: Diagnosis not present

## 2018-03-18 DIAGNOSIS — E785 Hyperlipidemia, unspecified: Secondary | ICD-10-CM | POA: Diagnosis not present

## 2018-03-18 DIAGNOSIS — I10 Essential (primary) hypertension: Secondary | ICD-10-CM | POA: Diagnosis not present

## 2018-03-18 DIAGNOSIS — K449 Diaphragmatic hernia without obstruction or gangrene: Secondary | ICD-10-CM

## 2018-03-18 MED ORDER — SODIUM CHLORIDE 0.9 % IV SOLN: 500.0000 mL | Freq: Once | INTRAVENOUS | Status: DC

## 2018-03-18 NOTE — Progress Notes (Signed)
Pt's states no medical or surgical changes since previsit or office visit. 

## 2018-03-18 NOTE — Patient Instructions (Signed)
*   handout on stricture and dilation diet given.  YOU HAD AN ENDOSCOPIC PROCEDURE TODAY AT [facility]:   Refer to the procedure report that was given to you for any specific questions about what was found during the examination.  If the procedure report does not answer your questions, please call your gastroenterologist to clarify.  If you requested that your care partner not be given the details of your procedure findings, then the procedure report has been included in a sealed envelope for you to review at your convenience later.  YOU SHOULD EXPECT: Some feelings of bloating in the abdomen. Passage of more gas than usual.  Walking can help get rid of the air that was put into your GI tract during the procedure and reduce the bloating. If you had a lower endoscopy (such as a colonoscopy or flexible sigmoidoscopy) you may notice spotting of blood in your stool or on the toilet paper. If you underwent a bowel prep for your procedure, you may not have a normal bowel movement for a few days.  Please Note:  You might notice some irritation and congestion in your nose or some drainage.  This is from the oxygen used during your procedure.  There is no need for concern and it should clear up in a day or so.  SYMPTOMS TO REPORT IMMEDIATELY:    Following upper endoscopy (EGD)  Vomiting of blood or coffee ground material  New chest pain or pain under the shoulder blades  Painful or persistently difficult swallowing  New shortness of breath  Fever of 100F or higher  Black, tarry-looking stools  For urgent or emergent issues, a gastroenterologist can be reached at any hour by calling [phone].   DIET:  Clear liquid diet until 6:00pm, then soft foods for the rest of today. Tomorrow you may proceed to your regular diet.  Drink plenty of fluids but you should avoid alcoholic beverages for 24 hours.  ACTIVITY:  You should plan to take it easy for the rest of today and you should  NOT DRIVE or use heavy machinery until tomorrow (because of the sedation medicines used during the test).    FOLLOW UP: Our staff will call the number listed on your records the next business day following your procedure to check on you and address any questions or concerns that you may have regarding the information given to you following your procedure. If we do not reach you, we will leave a message.  However, if you are feeling well and you are not experiencing any problems, there is no need to return our call.  We will assume that you have returned to your regular daily activities without incident.  If any biopsies were taken you will be contacted by phone or by letter within the next 1-3 weeks.  Please call us at [phone] if you have not heard about the biopsies in 3 weeks.    SIGNATURES/CONFIDENTIALITY: You and/or your care partner have signed paperwork which will be entered into your electronic medical record.  These signatures attest to the fact that that the information above on your After Visit Summary has been reviewed and is understood.  Full responsibility of the confidentiality of this discharge information lies with you and/or your care-partner.

## 2018-03-18 NOTE — Progress Notes (Signed)
Called to room to assist during endoscopic procedure.  Patient ID and intended procedure confirmed with present staff. Received instructions for my participation in the procedure from the performing physician.  

## 2018-03-18 NOTE — Progress Notes (Signed)
Report to PACU, RN, vss, BBS= Clear.  

## 2018-03-18 NOTE — Op Note (Signed)
[facility] Patient Name: Robin [name] Procedure Date: 03/18/2018 3:52 PM MRN: [id number] Endoscopist: [name] , MD Age: 84 Referring MD:  Date of Birth: [birth year] Gender: Female Account #: [id number] Procedure:                Upper GI endoscopy Indications:              Dysphagia, Gastroesophageal reflux disease,                            Abnormal barium esophagram Medicines:                Monitored Anesthesia Care Procedure:                Pre-Anesthesia Assessment:                           - Prior to the procedure, a History and Physical                            was performed, and patient medications and                            allergies were reviewed. The patient's tolerance of                            previous anesthesia was also reviewed. The risks                            and benefits of the procedure and the sedation                            options and risks were discussed with the patient.                            All questions were answered, and informed consent                            was obtained. Prior Anticoagulants: The patient has                            taken no previous anticoagulant or antiplatelet                            agents. ASA Grade Assessment: III - A patient with                            severe systemic disease. After reviewing the risks                            and benefits, the patient was deemed in                            satisfactory condition to undergo the procedure.  After obtaining informed consent, the endoscope was                            passed under direct vision. Throughout the                            procedure, the patient's blood pressure, pulse, and                            oxygen saturations were monitored continuously. The                            Endoscope was introduced through the mouth, and                            advanced to the second part of  duodenum. The upper                            GI endoscopy was accomplished without difficulty.                            The patient tolerated the procedure well. Scope In: Scope Out: Findings:                 One benign-appearing, intrinsic mild stenosis was                            found at the gastroesophageal junction. This                            stenosis measured 1.3 cm (inner diameter). The                            stenosis was traversed. A guidewire was placed and                            the scope was withdrawn. Dilation was performed                            with a Savary dilator with mild resistance at 14 mm.                           The exam of the esophagus was otherwise normal.                           A small hiatal hernia was present.                           A single 4 mm sessile polyp with no bleeding and no                            stigmata of recent bleeding was found in the  gastric body. Typical features of a benign gastric                            fundic polyp.                           The exam of the stomach was otherwise normal.                           The duodenal bulb and second portion of the                            duodenum were normal. Complications:            No immediate complications. Estimated Blood Loss:     Estimated blood loss: none. Impression:               - Benign-appearing esophageal stenosis. Dilated.                           - Small hiatal hernia.                           - Normal duodenal bulb and second portion of the                            duodenum.                           - No specimens collected. Recommendation:           - Patient has a contact number available for                            emergencies. The signs and symptoms of potential                            delayed complications were discussed with the                            patient. Return to normal activities  tomorrow.                            Written discharge instructions were provided to the                            patient.                           - Clear liquid diet for 2 hours, then advance as                            tolerated to soft diet today.                           - Continue present medications. [name], MD 03/18/2018 4:08:14 PM This report has been signed electronically.

## 2018-03-19 ENCOUNTER — Telehealth: Payer: Self-pay | Admitting: *Deleted

## 2018-03-19 NOTE — Telephone Encounter (Signed)
  Follow up Call-  Call back number 03/18/2018  Post procedure Call Back phone  # [phone]  Permission to leave phone message Yes  Some recent data might be hidden     Patient questions:  Do you have a fever, pain , or abdominal swelling? No. Pain Score  0 *  Have you tolerated food without any problems? Yes.    Have you been able to return to your normal activities? Yes.    Do you have any questions about your discharge instructions: Diet   No. Medications  No. Follow up visit  No.  Do you have questions or concerns about your Care? No.  Actions: * If pain score is 4 or above: No action needed, pain <4.

## 2018-04-22 ENCOUNTER — Ambulatory Visit: Payer: Medicare Other | Admitting: Endocrinology

## 2018-04-23 ENCOUNTER — Encounter: Payer: Self-pay | Admitting: Endocrinology

## 2018-04-23 ENCOUNTER — Ambulatory Visit (INDEPENDENT_AMBULATORY_CARE_PROVIDER_SITE_OTHER): Payer: Medicare Other | Admitting: Endocrinology

## 2018-04-23 VITALS — BP 136/78 | HR 94 | Ht 62.0 in | Wt 162.0 lb

## 2018-04-23 DIAGNOSIS — E213 Hyperparathyroidism, unspecified: Secondary | ICD-10-CM

## 2018-04-23 LAB — VITAMIN D 25 HYDROXY (VIT D DEFICIENCY, FRACTURES): VITD: 41.81 ng/mL (ref 30.00–100.00)

## 2018-04-23 NOTE — Patient Instructions (Signed)
blood tests are requested for you today.  We'll let you know about the specific number results. Unless the calcium goes over 12, you don't need surgery. Please continue the same medications Please come back for a follow-up appointment in 6 months.

## 2018-04-23 NOTE — Progress Notes (Signed)
Subjective:    Patient ID: Robin [name], female    DOB: 1934/04/30, 84 y.o.   MRN: [id number]  HPI The state of at least three ongoing medical problems is addressed today, with interval history of each noted here: Pt returns for f/u of hyperparathyroidism (probably combined primary and secondary; first noted in 2009; she has never had urolithiasis, PUD, pancreatitis, depression, or bony fracture; DEXA in 2016 showed osteopenia, and she declines f/u of this; she has mild hypercalcemia).  Denies cramps. This is a stable problem.  Vitamin-D deficiency: she denies numbness.  She takes vitamin-D, 2000 units per day.  This is a stable problem.   HTN: leg edema is unchanged.  This is a stable problem.   Past Medical History:  Diagnosis Date  . Allergy   . Arthritis   . Asthma   . Colon polyps    tubulovillous adenoma, 2010  . External hemorrhoids without mention of complication   . GERD (gastroesophageal reflux disease)   . Headache    eval with neuro in 2011  . Hyperlipemia   . Hyperparathyroidism [facility])    seeing Dr. Dwyane Dee in endocrinology  . Hypertension   . IBS (irritable bowel syndrome), GERD, hx colon polyps, chronic abd pain - followed by Dr. Sharlett Iles in GI 06/11/2012  . Osteoporosis 08/29/2011  . Vertigo    eval with neuro in 2011, brief recurrence 2016    Past Surgical History:  Procedure Laterality Date  . BACK SURGERY  1974  . CARPAL TUNNEL RELEASE  2/10  . CATARACT EXTRACTION, BILATERAL    . COLONOSCOPY    . POLYPECTOMY    . TONSILLECTOMY    . UPPER GASTROINTESTINAL ENDOSCOPY      Social History   Socioeconomic History  . Marital status: Widowed    Spouse name: Not on file  . Number of children: 2  . Years of education: Not on file  . Highest education level: Not on file  Occupational History  . Occupation: retired    Fish farm manager: RETIRED  Social Needs  . Financial resource strain: Not on file  . Food insecurity:    Worry: Not on file    Inability: Not on  file  . Transportation needs:    Medical: Not on file    Non-medical: Not on file  Tobacco Use  . Smoking status: Never Smoker  . Smokeless tobacco: Never Used  Substance and Sexual Activity  . Alcohol use: No    Alcohol/week: 0.0 standard drinks  . Drug use: No  . Sexual activity: Not on file  Lifestyle  . Physical activity:    Days per week: Not on file    Minutes per session: Not on file  . Stress: Not on file  Relationships  . Social connections:    Talks on phone: Not on file    Gets together: Not on file    Attends religious service: Not on file    Active member of club or organization: Not on file    Attends meetings of clubs or organizations: Not on file    Relationship status: Not on file  . Intimate partner violence:    Fear of current or ex partner: Not on file    Emotionally abused: Not on file    Physically abused: Not on file    Forced sexual activity: Not on file  Other Topics Concern  . Not on file  Social History Narrative   Lives alone in a one story home.  Has  2 children.  Retired from working for a bank.  Education: high school.     Current Outpatient Medications on File Prior to Visit  Medication Sig Dispense Refill  . AMBULATORY NON FORMULARY MEDICATION Medication Name: GI cocktail 5-10 ML every 4-6 hours as needed for pain 450 mL 1  . amLODipine (NORVASC) 5 MG tablet Take 1 tablet (5 mg total) by mouth daily. 90 tablet 3  . cetirizine (ZYRTEC) 10 MG tablet Take 10 mg by mouth daily.    . Cholecalciferol (VITAMIN D) 2000 units CAPS Take 2,000 Units by mouth daily.    Marland Kitchen esomeprazole (NEXIUM) 40 MG capsule Take 1 capsule (40 mg total) by mouth 2 (two) times daily before a meal. 60 capsule 3  . ezetimibe (ZETIA) 10 MG tablet TAKE 1 BY MOUTH DAILY 90 tablet 3  . furosemide (LASIX) 20 MG tablet Take 1 tablet (20 mg total) by mouth daily. 90 tablet 3  . hydrocortisone (ANUSOL-HC) 2.5 % rectal cream Apply to the rectal area 2-3 times daily as needed. 30 g 1   . lisinopril (PRINIVIL,ZESTRIL) 40 MG tablet Take 1 tablet (40 mg total) by mouth daily. 90 tablet 3  . rosuvastatin (CRESTOR) 5 MG tablet Take 1/2 tablet by mouth daily. 45 tablet 3   No current facility-administered medications on file prior to visit.     Allergies  Allergen Reactions  . Prilosec [Omeprazole] Cough  . Amoxicillin     REACTION: rash  . Metronidazole     REACTION: rash  . Protonix [Pantoprazole Sodium] Swelling    Family History  Problem Relation Age of Onset  . Diabetes Brother   . Hypertension Brother   . Hypertension Father   . Stroke Father   . Diabetes Sister   . Hypertension Sister   . Heart disease Sister   . CAD Mother        Died of MI at age 77  . CAD Brother        Died of MI at age 86  . Colon cancer Neg Hx   . Hypercalcemia Neg Hx   . Colon polyps Neg Hx   . Esophageal cancer Neg Hx   . Stomach cancer Neg Hx   . Rectal cancer Neg Hx     BP 136/78 (BP Location: Left Arm, Patient Position: Sitting, Cuff Size: Normal)   Pulse 94   Ht 5\' 2"  (1.575 m)   Wt 162 lb (73.5 kg)   SpO2 97%   BMI 29.63 kg/m    Review of Systems Denies sob and falls    Objective:   Physical Exam VITAL SIGNS:  See vs page GENERAL: no distress EXT: trace bilat leg edema.     Lab Results  Component Value Date   PTH 94 (H) 04/23/2018   CALCIUM 10.3 04/23/2018   25-OH vit-D=normal    Assessment & Plan:  Vit-D def: well-replaced Hyperparathyroidism: mild.  She may have a combination of primary and secondary.  HTN: well-controlled.   Patient Instructions  blood tests are requested for you today.  We'll let you know about the specific number results. Unless the calcium goes over 12, you don't need surgery. Please continue the same medications Please come back for a follow-up appointment in 6 months.

## 2018-04-24 LAB — PTH, INTACT AND CALCIUM
Calcium: 10.3 mg/dL (ref 8.6–10.4)
PTH: 94 pg/mL — ABNORMAL HIGH (ref 14–64)

## 2018-07-12 NOTE — Progress Notes (Signed)
HPI:  Using dictation device. Unfortunately this device frequently misinterprets words/phrases.  Robin [name] is a pleasant 85 y.o. here for follow up. Chronic medical problems summarized below were reviewed for changes and stability and were updated as needed below. These issues and their treatment remain stable for the most part.  Sick for 2 months with a cough. Reports did have nasal congestion, sore throat, body aches initially. Now with persistent productive cough, pnd and occ dyspnea. Denies travel, sinus pain, fevers, CP, SOB, DOE, treatment intolerance or new symptoms. Also with R low back pain. For a few weeks. Reports intermittent for years - usually resolved with biofreeze. Tylenol helps. Can not think of inciting injury. No radiation, weakness, numbness, bowel or bladder incontinence. Due for labs.   AWV 02/13/17, seeing Raquel Sarna for AWV today  HTN/Aortic atherosclerosis/mild AS: -sees cardiologist, Dr. [name] -she keeps home log, ambulatory BP better then office checks -lisinopril 40mg , norvasc 5mg  -endo had rxd lasixfor edema and calcium -she has a long list of antihypertensives she did not tolerate, is reluctant to take any of them  Dizziness/Balance issues/HEadaches: -neurology eval 11/10/16 - EEG (normal), MRIwith chronic sm vessel disease and asa was advised, CTA ok per neurology notes -neurology advised gabapentin for headaches -neurology referred to cardiology for eval, echo with mild AS (2018) per cardiology notes -also saw ENT in 2019  HLD: -meds: crestor 2.5mg  daily, zetia 10mg  -hx statin intol  Hypercalcemia/Hyperparathyroidism/Vit D def -seeing endocrinologist -Vit D restarted by endo 2018  GERD: -she stopped taking her PPI, used to take nexium 81, reports takes rarely when has symptoms -hx esophageal stricture in 2019 - saw Dr. Fuller Plan -hx hiatal hernia and gastritis  ROS: See pertinent positives and negatives per HPI.  Past Medical History:   Diagnosis Date  . Allergy   . Arthritis   . Asthma   . Colon polyps    tubulovillous adenoma, 2010  . External hemorrhoids without mention of complication   . GERD (gastroesophageal reflux disease)   . Headache    eval with neuro in 2011  . Hyperlipemia   . Hyperparathyroidism [facility])    seeing Dr. Dwyane Dee in endocrinology  . Hypertension   . IBS (irritable bowel syndrome), GERD, hx colon polyps, chronic abd pain - followed by Dr. Sharlett Iles in GI 06/11/2012  . Osteoporosis 08/29/2011  . Vertigo    eval with neuro in 2011, brief recurrence 2016    Past Surgical History:  Procedure Laterality Date  . BACK SURGERY  1974  . CARPAL TUNNEL RELEASE  2/10  . CATARACT EXTRACTION, BILATERAL    . COLONOSCOPY    . POLYPECTOMY    . TONSILLECTOMY    . UPPER GASTROINTESTINAL ENDOSCOPY      Family History  Problem Relation Age of Onset  . Diabetes Brother   . Hypertension Brother   . Hypertension Father   . Stroke Father   . Diabetes Sister   . Hypertension Sister   . Heart disease Sister   . CAD Mother        Died of MI at age 81  . CAD Brother        Died of MI at age 49  . Colon cancer Neg Hx   . Hypercalcemia Neg Hx   . Colon polyps Neg Hx   . Esophageal cancer Neg Hx   . Stomach cancer Neg Hx   . Rectal cancer Neg Hx     SOCIAL HX: see hpi   Current Outpatient Medications:  .  AMBULATORY NON FORMULARY MEDICATION, Medication Name: GI cocktail 5-10 ML every 4-6 hours as needed for pain, Disp: 450 mL, Rfl: 1 .  amLODipine (NORVASC) 5 MG tablet, Take 1 tablet (5 mg total) by mouth daily., Disp: 90 tablet, Rfl: 3 .  cetirizine (ZYRTEC) 10 MG tablet, Take 10 mg by mouth daily., Disp: , Rfl:  .  Cholecalciferol (VITAMIN D) 2000 units CAPS, Take 2,000 Units by mouth daily., Disp: , Rfl:  .  esomeprazole (NEXIUM) 40 MG capsule, Take 1 capsule (40 mg total) by mouth 2 (two) times daily before a meal., Disp: 60 capsule, Rfl: 3 .  ezetimibe (ZETIA) 10 MG tablet, TAKE 1 BY MOUTH  DAILY, Disp: 90 tablet, Rfl: 3 .  furosemide (LASIX) 20 MG tablet, Take 1 tablet (20 mg total) by mouth daily., Disp: 90 tablet, Rfl: 3 .  hydrocortisone (ANUSOL-HC) 2.5 % rectal cream, Apply to the rectal area 2-3 times daily as needed., Disp: 30 g, Rfl: 1 .  lisinopril (PRINIVIL,ZESTRIL) 40 MG tablet, Take 1 tablet (40 mg total) by mouth daily., Disp: 90 tablet, Rfl: 3 .  rosuvastatin (CRESTOR) 5 MG tablet, Take 1/2 tablet by mouth daily., Disp: 45 tablet, Rfl: 3  EXAM:  Vitals:   07/15/18 0933  BP: (!) 142/80  Pulse: 93  Temp: 98.3 F (36.8 C)    Body mass index is 29.85 kg/m.  GENERAL: vitals reviewed and listed above, alert, oriented, appears well hydrated and in no acute distress  HEENT: atraumatic, conjunttiva clear, no obvious abnormalities on inspection of external nose and ears, normal appearance of ear canals and TMs, clear nasal congestion, mild post oropharyngeal erythema with PND, no tonsillar edema or exudate, no sinus TTP  NECK: no obvious masses on inspection  LUNGS: clear to auscultation bilaterally, no wheezes, rales or rhonchi, good air movement  CV: HRRR, no peripheral edema  MS: moves all extremities without noticeable abnormality, normal gait, TTP R SI joint region, no radiculat pt with straight leg raise or int/ext rot hips, NV intact LEs  PSYCH: pleasant and cooperative, no obvious depression or anxiety  ASSESSMENT AND PLAN:  Discussed the following assessment and plan:  Essential hypertension - Plan: Basic metabolic panel, CBC  Aortic atherosclerosis (HCC)  Hyperlipidemia, unspecified hyperlipidemia type - Plan: Lipid panel  Hyperparathyroidism (HCC)  Cough - Plan: DG Chest 2 View  PND (post-nasal drip)  Chronic right SI joint pain  -labs -xray to exclude LRI, nasal saline, pulm referral if neg and symptoms persist -HEP, symptomatic care for the low back pain, advised f/u in 2-4 weeks if not resolved, sooner if worsening -lifestyle recs  for wt reduction -cont current meds, recheck bp leaving -Patient advised to return or notify a doctor immediately if symptoms worsen or persist or new concerns arise.  Patient Instructions  BEFORE YOU LEAVE: -labs -xray -SI exercises -AWV with Raquel Sarna after labs/xray -follow up: 1 month if back pain persists, 3 months otherwise  Do the back exercises 4 days per week. Topical menthol, heat and as needed tylenol per dosing instructions for pain. Follow up in 2-4 weeks if persists.  Check the chest xray. Try nasal saline twice daily. If cough persists let us know and we would have you see a specialist.  We have ordered labs or studies at this visit. It can take up to 1-2 weeks for results and processing. IF results require follow up or explanation, we will call you with instructions. Clinically stable results will be released to your [facility]. If you  have not heard from [place] or cannot find your results in [facility] in 2 weeks please contact our office at [phone].  If you are not yet signed up for [facility], please consider signing up.   We recommend the following healthy lifestyle for LIFE: 1) Small portions. But, make sure to get regular (at least 3 per day), healthy meals and small healthy snacks if needed.  2) Eat a healthy clean diet.   TRY TO EAT: -at least 5-7 servings of low sugar, colorful, and nutrient rich vegetables per day (not corn, potatoes or bananas.) -berries are the best choice if you wish to eat fruit (only eat small amounts if trying to reduce weight)  -lean meets (fish, white meat of chicken or [place]) -vegan proteins for some meals - beans or tofu, whole grains, nuts and seeds -Replace bad fats with good fats - good fats include: fish, nuts and seeds, canola oil, olive oil -small amounts of low fat or non fat dairy -small amounts of100 % whole grains - check the lables -drink plenty of water  AVOID: -SUGAR, sweets, anything with added sugar, corn syrup or sweeteners  - must read labels as even foods advertised as "healthy" often are loaded with sugar -if you must have a sweetener, small amounts of stevia may be best -sweetened beverages and artificially sweetened beverages -simple starches (rice, bread, potatoes, pasta, chips, etc - small amounts of 100% whole grains are ok) -red meat, pork, butter -fried foods, fast food, processed food, excessive dairy, eggs and coconut.  3)Get at least 150 minutes of sweaty aerobic exercise per week.  4)Reduce stress - consider counseling, meditation and relaxation to balance other aspects of your life.          Lucretia Kern, DO

## 2018-07-15 ENCOUNTER — Ambulatory Visit (INDEPENDENT_AMBULATORY_CARE_PROVIDER_SITE_OTHER): Payer: Medicare Other

## 2018-07-15 ENCOUNTER — Ambulatory Visit (INDEPENDENT_AMBULATORY_CARE_PROVIDER_SITE_OTHER): Payer: Medicare Other | Admitting: Family Medicine

## 2018-07-15 ENCOUNTER — Encounter: Payer: Self-pay | Admitting: Family Medicine

## 2018-07-15 VITALS — BP 142/80 | HR 93 | Temp 98.3°F | Ht 62.0 in | Wt 163.2 lb

## 2018-07-15 DIAGNOSIS — E213 Hyperparathyroidism, unspecified: Secondary | ICD-10-CM

## 2018-07-15 DIAGNOSIS — I10 Essential (primary) hypertension: Secondary | ICD-10-CM | POA: Diagnosis not present

## 2018-07-15 DIAGNOSIS — M533 Sacrococcygeal disorders, not elsewhere classified: Secondary | ICD-10-CM

## 2018-07-15 DIAGNOSIS — Z0000 Encounter for general adult medical examination without abnormal findings: Secondary | ICD-10-CM

## 2018-07-15 DIAGNOSIS — G8929 Other chronic pain: Secondary | ICD-10-CM | POA: Diagnosis not present

## 2018-07-15 DIAGNOSIS — R059 Cough, unspecified: Secondary | ICD-10-CM

## 2018-07-15 DIAGNOSIS — E785 Hyperlipidemia, unspecified: Secondary | ICD-10-CM | POA: Diagnosis not present

## 2018-07-15 DIAGNOSIS — R05 Cough: Secondary | ICD-10-CM

## 2018-07-15 DIAGNOSIS — R0982 Postnasal drip: Secondary | ICD-10-CM | POA: Diagnosis not present

## 2018-07-15 DIAGNOSIS — I700 Atherosclerosis of aorta: Secondary | ICD-10-CM | POA: Diagnosis not present

## 2018-07-15 LAB — LIPID PANEL
CHOLESTEROL: 176 mg/dL (ref 0–200)
HDL: 65.1 mg/dL (ref >39.00)
LDL Cholesterol: 91 mg/dL (ref 0–99)
NONHDL: 110.87
Total CHOL/HDL Ratio: 3
Triglycerides: 97 mg/dL (ref 0.0–149.0)
VLDL: 19.4 mg/dL (ref 0.0–40.0)

## 2018-07-15 LAB — BASIC METABOLIC PANEL
BUN: 21 mg/dL (ref 6–23)
CHLORIDE: 102 meq/L (ref 96–112)
CO2: 30 meq/L (ref 19–32)
CREATININE: 0.72 mg/dL (ref 0.40–1.20)
Calcium: 10.8 mg/dL — ABNORMAL HIGH (ref 8.4–10.5)
GFR: 77.44 mL/min (ref >60.00)
GLUCOSE: 84 mg/dL (ref 70–99)
POTASSIUM: 4.3 meq/L (ref 3.5–5.1)
Sodium: 139 mEq/L (ref 135–145)

## 2018-07-15 LAB — CBC
HEMATOCRIT: 40.1 % (ref 36.0–46.0)
HEMOGLOBIN: 13.2 g/dL (ref 12.0–15.0)
MCHC: 32.8 g/dL (ref 30.0–36.0)
MCV: 91.6 fl (ref 78.0–100.0)
Platelets: 357 10*3/uL (ref 150.0–400.0)
RBC: 4.38 Mil/uL (ref 3.87–5.11)
RDW: 13.4 % (ref 11.5–15.5)
WBC: 8.5 10*3/uL (ref 4.0–10.5)

## 2018-07-15 NOTE — Progress Notes (Signed)
Subjective:   Robin [name] is a 85 y.o. female who presents for Medicare Annual (Subsequent) preventive examination.  Review of Systems:  No ROS.  Medicare Wellness Visit. Additional risk factors are reflected in the social history.  Cardiac Risk Factors include: hypertension;sedentary lifestyle;dyslipidemia;advanced age (>2men, >54 women) Sleep patterns: feels rested on waking and gets up 2-3 times nightly to void. Has not been sleeping well recently d/t cough but usually sleeps ok per pt.     Home Safety/Smoke Alarms: Feels safe in home. Smoke alarms in place.  Living environment; residence and Firearm Safety: 1-story house/ trailer. No use or need for DME at this time.  Seat Belt Safety/Bike Helmet: Wears seat belt.   Female:   Pap- N/A d/t age     49- 12/2017, due 12/2018, pt. aware   Dexa scan- 08/2014, no interest in follow-up      CCS- 10/2013, no longer indicated d/t age      Objective:     Vitals: There were no vitals taken for this visit.  There is no height or weight on file to calculate BMI.  Advanced Directives 07/15/2018 10/02/2017 02/13/2017 07/02/2015 09/30/2013  Does Patient Have a Medical Advance Directive? No Yes No No Patient does not have advance directive  Type of Advance Directive - Living will - - -  Does patient want to make changes to medical advance directive? - No - Patient declined - - -  Would patient like information on creating a medical advance directive? No - Patient declined - - No - patient declined information -  Pre-existing out of facility DNR order (yellow form or pink MOST form) - - - - No    Tobacco Social History   Tobacco Use  Smoking Status Never Smoker  Smokeless Tobacco Never Used     Counseling given: Not Answered   Past Medical History:  Diagnosis Date  . Allergy   . Arthritis   . Asthma   . Colon polyps    tubulovillous adenoma, 2010  . External hemorrhoids without mention of complication   . GERD (gastroesophageal  reflux disease)   . Headache    eval with neuro in 2011  . Hyperlipemia   . Hyperparathyroidism [facility])    seeing Dr. Dwyane Dee in endocrinology  . Hypertension   . IBS (irritable bowel syndrome), GERD, hx colon polyps, chronic abd pain - followed by Dr. Sharlett Iles in GI 06/11/2012  . Osteoporosis 08/29/2011  . Vertigo    eval with neuro in 2011, brief recurrence 2016   Past Surgical History:  Procedure Laterality Date  . BACK SURGERY  1974  . CARPAL TUNNEL RELEASE  2/10  . CATARACT EXTRACTION, BILATERAL    . COLONOSCOPY    . EYE SURGERY  07/2017   bilateral cataract extraction  . POLYPECTOMY    . TONSILLECTOMY    . UPPER GASTROINTESTINAL ENDOSCOPY     Family History  Problem Relation Age of Onset  . Diabetes Brother   . Hypertension Brother   . Hypertension Father   . Stroke Father   . Diabetes Sister   . Hypertension Sister   . Heart disease Sister   . CAD Mother        Died of MI at age 86  . CAD Brother        Died of MI at age 44  . Colon cancer Neg Hx   . Hypercalcemia Neg Hx   . Colon polyps Neg Hx   . Esophageal cancer Neg  Hx   . Stomach cancer Neg Hx   . Rectal cancer Neg Hx    Social History   Socioeconomic History  . Marital status: Widowed    Spouse name: Not on file  . Number of children: 2  . Years of education: Not on file  . Highest education level: Not on file  Occupational History  . 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  Social Needs  . Financial resource strain: Not hard at all  . Food insecurity:    Worry: Never true    Inability: Never true  . Transportation needs:    Medical: No    Non-medical: No  Tobacco Use  . Smoking status: Never Smoker  . Smokeless tobacco: Never Used  Substance and Sexual Activity  . Alcohol use: No    Alcohol/week: 0.0 standard drinks  . Drug use: No  . Sexual activity: Not Currently  Lifestyle  . Physical activity:    Days per week: 0 days    Minutes per session: 0 min  .  Stress: Not at all  Relationships  . Social connections:    Talks on phone: Not on file    Gets together: Not on file    Attends religious service: Not on file    Active member of club or organization: Not on file    Attends meetings of clubs or organizations: Not on file    Relationship status: Widowed  Other Topics Concern  . Not on file  Social History Narrative   Lives alone in a one story home.  Has 2 children, one son and one daughter with 2 grandkids and 1 great-grandchild.    Retired from working for a bank.     Education: high school.     Outpatient Encounter Medications as of 07/15/2018  Medication Sig  . AMBULATORY NON FORMULARY MEDICATION Medication Name: GI cocktail 5-10 ML every 4-6 hours as needed for pain  . amLODipine (NORVASC) 5 MG tablet Take 1 tablet (5 mg total) by mouth daily.  . cetirizine (ZYRTEC) 10 MG tablet Take 10 mg by mouth daily.  . Cholecalciferol (VITAMIN D) 2000 units CAPS Take 2,000 Units by mouth daily.  Marland Kitchen esomeprazole (NEXIUM) 40 MG capsule Take 1 capsule (40 mg total) by mouth 2 (two) times daily before a meal.  . ezetimibe (ZETIA) 10 MG tablet TAKE 1 BY MOUTH DAILY  . furosemide (LASIX) 20 MG tablet Take 1 tablet (20 mg total) by mouth daily.  Marland Kitchen lisinopril (PRINIVIL,ZESTRIL) 40 MG tablet Take 1 tablet (40 mg total) by mouth daily.  . rosuvastatin (CRESTOR) 5 MG tablet Take 1/2 tablet by mouth daily.  . [DISCONTINUED] hydrocortisone (ANUSOL-HC) 2.5 % rectal cream Apply to the rectal area 2-3 times daily as needed. (Patient not taking: Reported on 07/15/2018)   No facility-administered encounter medications on file as of 07/15/2018.     Activities of Daily Living In your present state of health, do you have any difficulty performing the following activities: 07/15/2018  Hearing? N  Vision? N  Difficulty concentrating or making decisions? N  Walking or climbing stairs? N  Dressing or bathing? N  Doing errands, shopping? N  Preparing Food and eating  ? N  Using the Toilet? N  In the past six months, have you accidently leaked urine? Y  Comment kegel exercises discussed  Do you have problems with loss of bowel control? N  Managing your Medications? N  Managing your Finances? N  Housekeeping or managing your Housekeeping? N  Some recent data might be hidden    Patient Care Team: Lucretia Kern, DO as PCP - General (Family Medicine) [name], MD as Consulting Physician (Gastroenterology) [name], MD as Consulting Physician (Cardiology) [name], MD as Consulting Physician (Ophthalmology)    Assessment:   This is a routine wellness examination for Robin [name]. Physical assessment deferred to PCP.   Exercise Activities and Dietary recommendations Current Exercise Habits: The patient does not participate in regular exercise at present, Exercise limited by: cardiac condition(s) Diet (meal preparation, eat out, water intake, caffeinated beverages, dairy products, fruits and vegetables): in general, an "unhealthy" diet. Author offered cholesterol, low-fat diet education but pt. Declined. "I'm going to be honest, I'm probably not going to change anything about my diet". Pt did admit that she does not like to drink water. Importance of hydration discussed, especially with hx vertigo (which pt. States she rarely has now), and with recent URI; pt. Verbalized understanding. Practical ways of incorporating more water in daily intake discussed.  :      Goals    . Exercise 150 minutes per week (moderate activity)     3 days- 40 minutes  May call the community center to see if they have a walking program and can designate a safe time to walk     . Patient Stated     Remain as independent as possible; keep free of infections       Fall Risk Fall Risk  07/15/2018 07/15/2018 02/13/2017 12/15/2016 12/06/2015  Falls in the past year? 0 0 No No -  Number falls in past yr: - 0 - - 1  Injury with Fall? - 0 - - No     Depression  Screen PHQ 2/9 Scores 07/15/2018 02/13/2017 12/06/2015 08/10/2014  PHQ - 2 Score 0 0 0 0  PHQ- 9 Score 0 - - -     Cognitive Function MMSE - Mini Mental State Exam 02/13/2017  Not completed: (No Data)       Ad8 score reviewed for issues:  Issues making decisions: no  Less interest in hobbies / activities: no  Repeats questions, stories (family complaining): no  Trouble using ordinary gadgets (microwave, computer, phone):no  Forgets the month or year: no  Mismanaging finances: no  Remembering appts: no  Daily problems with thinking and/or memory: no Ad8 score is= 0    Immunization History  Administered Date(s) Administered  . Influenza, High Dose Seasonal PF 03/23/2015, 02/22/2016, 02/13/2017, 03/11/2018  . Influenza,inj,Quad PF,6+ Mos 03/07/2013, 03/03/2014  . Pneumococcal Conjugate-13 08/10/2014  . Pneumococcal Polysaccharide-23 07/23/2012  . Tdap 07/23/2012  . Zoster 09/30/2013    Qualifies for Shingles Vaccine?  Education provided; pt. Stated she would follow-up with Dr. Maudie Mercury about it.   Screening Tests Health Maintenance  Topic Date Due  . TETANUS/TDAP  07/24/2022  . INFLUENZA VACCINE  Completed  . DEXA SCAN  Completed  . PNA vac Low Risk Adult  Completed       Plan:    Increase non-caffeinated fluid intake during the day to at least 48oz daily to help prevent dehydration, UTI, and increase energy. Will also help you recover from respiratory illness faster too!  Let us know if you reconsider getting information on advance directives.  Office will be in touch about chest x-ray and lab results. I have personally reviewed and noted the following in the patient's chart:   . Medical and social history . Use of  alcohol, tobacco or illicit drugs  . Current medications and supplements . Functional ability and status . Nutritional status . Physical activity . Advanced directives . List of other physicians . Hospitalizations, surgeries, and ER visits in  previous 12 months . Vitals . Screenings to include cognitive, depression, and falls . Referrals and appointments  In addition, I have reviewed and discussed with patient certain preventive protocols, quality metrics, and best practice recommendations. A written personalized care plan for preventive services as well as general preventive health recommendations were provided to patient.     Alphia Moh, RN  07/15/2018

## 2018-07-15 NOTE — Patient Instructions (Addendum)
Increase non-caffeinated fluid intake during the day to at least 48oz daily to help prevent dehydration, UTI, and increase energy. Will also help you recover from respiratory illness faster too!  Let us know if you reconsider getting information on advance directives.  Office will be in touch about chest x-ray and lab results.  Nice to meet you today!    Robin [name] , Thank you for taking time to come for your Medicare Wellness Visit. I appreciate your ongoing commitment to your health goals. Please review the following plan we discussed and let me know if I can assist you in the future.   These are the goals we discussed: Goals    . Exercise 150 minutes per week (moderate activity)     3 days- 40 minutes  May call the community center to see if they have a walking program and can designate a safe time to walk     . Patient Stated     Remain as independent as possible; keep free on infections       This is a list of the screening recommended for you and due dates:  Health Maintenance  Topic Date Due  . Tetanus Vaccine  07/24/2022  . Flu Shot  Completed  . DEXA scan (bone density measurement)  Completed  . Pneumonia vaccines  Completed     Fall Prevention in the Home, Adult Falls can cause injuries. They can happen to people of all ages. There are many things you can do to make your home safe and to help prevent falls. Ask for help when making these changes, if needed. What actions can I take to prevent falls? General Instructions  Use good lighting in all rooms. Replace any light bulbs that burn out.  Turn on the lights when you go into a dark area. Use night-lights.  Keep items that you use often in easy-to-reach places. Lower the shelves around your home if necessary.  Set up your furniture so you have a clear path. Avoid moving your furniture around.  Do not have throw rugs and other things on the floor that can make you trip.  Avoid walking on wet floors.  If any  of your floors are uneven, fix them.  Add color or contrast paint or tape to clearly mark and help you see: ? Any grab bars or handrails. ? First and last steps of stairways. ? Where the edge of each step is.  If you use a stepladder: ? Make sure that it is fully opened. Do not climb a closed stepladder. ? Make sure that both sides of the stepladder are locked into place. ? Ask someone to hold the stepladder for you while you use it.  If there are any pets around you, be aware of where they are. What can I do in the bathroom?      Keep the floor dry. Clean up any water that spills onto the floor as soon as it happens.  Remove soap buildup in the tub or shower regularly.  Use non-skid mats or decals on the floor of the tub or shower.  Attach bath mats securely with double-sided, non-slip rug tape.  If you need to sit down in the shower, use a plastic, non-slip stool.  Install grab bars by the toilet and in the tub and shower. Do not use towel bars as grab bars. What can I do in the bedroom?  Make sure that you have a light by your bed that is easy  to reach.  Do not use any sheets or blankets that are too big for your bed. They should not hang down onto the floor.  Have a firm chair that has side arms. You can use this for support while you get dressed. What can I do in the kitchen?  Clean up any spills right away.  If you need to reach something above you, use a strong step stool that has a grab bar.  Keep electrical cords out of the way.  Do not use floor polish or wax that makes floors slippery. If you must use wax, use non-skid floor wax. What can I do with my stairs?  Do not leave any items on the stairs.  Make sure that you have a light switch at the top of the stairs and the bottom of the stairs. If you do not have them, ask someone to add them for you.  Make sure that there are handrails on both sides of the stairs, and use them. Fix handrails that are broken  or loose. Make sure that handrails are as long as the stairways.  Install non-slip stair treads on all stairs in your home.  Avoid having throw rugs at the top or bottom of the stairs. If you do have throw rugs, attach them to the floor with carpet tape.  Choose a carpet that does not hide the edge of the steps on the stairway.  Check any carpeting to make sure that it is firmly attached to the stairs. Fix any carpet that is loose or worn. What can I do on the outside of my home?  Use bright outdoor lighting.  Regularly fix the edges of walkways and driveways and fix any cracks.  Remove anything that might make you trip as you walk through a door, such as a raised step or threshold.  Trim any bushes or trees on the path to your home.  Regularly check to see if handrails are loose or broken. Make sure that both sides of any steps have handrails.  Install guardrails along the edges of any raised decks and porches.  Clear walking paths of anything that might make someone trip, such as tools or rocks.  Have any leaves, snow, or ice cleared regularly.  Use sand or salt on walking paths during winter.  Clean up any spills in your garage right away. This includes grease or oil spills. What other actions can I take?  Wear shoes that: ? Have a low heel. Do not wear high heels. ? Have rubber bottoms. ? Are comfortable and fit you well. ? Are closed at the toe. Do not wear open-toe sandals.  Use tools that help you move around (mobility aids) if they are needed. These include: ? Canes. ? Walkers. ? Scooters. ? Crutches.  Review your medicines with your doctor. Some medicines can make you feel dizzy. This can increase your chance of falling. Ask your doctor what other things you can do to help prevent falls. Where to find more information  Centers for Disease Control and Prevention, STEADI: https://garcia.biz/  [employer] on Aging: BrainJudge.co.uk Contact a  doctor if:  You are afraid of falling at home.  You feel weak, drowsy, or dizzy at home.  You fall at home. Summary  There are many simple things that you can do to make your home safe and to help prevent falls.  Ways to make your home safe include removing tripping hazards and installing grab bars in the bathroom.  Ask  for help when making these changes in your home. This information is not intended to replace advice given to you by your health care provider. Make sure you discuss any questions you have with your health care provider. Document Released: 02/25/2009 Document Revised: 12/14/2016 Document Reviewed: 12/14/2016 Elsevier Interactive Patient Education  2019 [place] Maintenance, Female Adopting a healthy lifestyle and getting preventive care can go a long way to promote health and wellness. Talk with your health care provider about what schedule of regular examinations is right for you. This is a good chance for you to check in with your provider about disease prevention and staying healthy. In between checkups, there are plenty of things you can do on your own. Experts have done a lot of research about which lifestyle changes and preventive measures are most likely to keep you healthy. Ask your health care provider for more information. Weight and diet Eat a healthy diet  Be sure to include plenty of vegetables, fruits, low-fat dairy products, and lean protein.  Do not eat a lot of foods high in solid fats, added sugars, or salt.  Get regular exercise. This is one of the most important things you can do for your health. ? Most adults should exercise for at least 150 minutes each week. The exercise should increase your heart rate and make you sweat (moderate-intensity exercise). ? Most adults should also do strengthening exercises at least twice a week. This is in addition to the moderate-intensity exercise. Maintain a healthy weight  Body mass index (BMI) is  a measurement that can be used to identify possible weight problems. It estimates body fat based on height and weight. Your health care provider can help determine your BMI and help you achieve or maintain a healthy weight.  For females 24 years of age and older: ? A BMI below 18.5 is considered underweight. ? A BMI of 18.5 to 24.9 is normal. ? A BMI of 25 to 29.9 is considered overweight. ? A BMI of 30 and above is considered obese. Watch levels of cholesterol and blood lipids  You should start having your blood tested for lipids and cholesterol at 84 years of age, then have this test every 5 years.  You may need to have your cholesterol levels checked more often if: ? Your lipid or cholesterol levels are high. ? You are older than 83 years of age. ? You are at high risk for heart disease. Cancer screening Lung Cancer  Lung cancer screening is recommended for adults 36-63 years old who are at high risk for lung cancer because of a history of smoking.  A yearly low-dose CT scan of the lungs is recommended for people who: ? Currently smoke. ? Have quit within the past 15 years. ? Have at least a 30-pack-year history of smoking. A pack year is smoking an average of one pack of cigarettes a day for 1 year.  Yearly screening should continue until it has been 15 years since you quit.  Yearly screening should stop if you develop a health problem that would prevent you from having lung cancer treatment. Breast Cancer  Practice breast self-awareness. This means understanding how your breasts normally appear and feel.  It also means doing regular breast self-exams. Let your health care provider know about any changes, no matter how small.  If you are in your 20s or 30s, you should have a clinical breast exam (CBE) by a health care provider every 1-3 years as part  of a regular health exam.  If you are 54 or older, have a CBE every year. Also consider having a breast X-ray (mammogram) every  year.  If you have a family history of breast cancer, talk to your health care provider about genetic screening.  If you are at high risk for breast cancer, talk to your health care provider about having an MRI and a mammogram every year.  Breast cancer gene (BRCA) assessment is recommended for women who have family members with BRCA-related cancers. BRCA-related cancers include: ? Breast. ? Ovarian. ? Tubal. ? Peritoneal cancers.  Results of the assessment will determine the need for genetic counseling and BRCA1 and BRCA2 testing. Cervical Cancer Your health care provider may recommend that you be screened regularly for cancer of the pelvic organs (ovaries, uterus, and vagina). This screening involves a pelvic examination, including checking for microscopic changes to the surface of your cervix (Pap test). You may be encouraged to have this screening done every 3 years, beginning at age 46.  For women ages 93-65, health care providers may recommend pelvic exams and Pap testing every 3 years, or they may recommend the Pap and pelvic exam, combined with testing for human papilloma virus (HPV), every 5 years. Some types of HPV increase your risk of cervical cancer. Testing for HPV may also be done on women of any age with unclear Pap test results.  Other health care providers may not recommend any screening for nonpregnant women who are considered low risk for pelvic cancer and who do not have symptoms. Ask your health care provider if a screening pelvic exam is right for you.  If you have had past treatment for cervical cancer or a condition that could lead to cancer, you need Pap tests and screening for cancer for at least 20 years after your treatment. If Pap tests have been discontinued, your risk factors (such as having a new sexual partner) need to be reassessed to determine if screening should resume. Some women have medical problems that increase the chance of getting cervical cancer. In  these cases, your health care provider may recommend more frequent screening and Pap tests. Colorectal Cancer  This type of cancer can be detected and often prevented.  Routine colorectal cancer screening usually begins at 83 years of age and continues through 83 years of age.  Your health care provider may recommend screening at an earlier age if you have risk factors for colon cancer.  Your health care provider may also recommend using home test kits to check for hidden blood in the stool.  A small camera at the end of a tube can be used to examine your colon directly (sigmoidoscopy or colonoscopy). This is done to check for the earliest forms of colorectal cancer.  Routine screening usually begins at age 82.  Direct examination of the colon should be repeated every 5-10 years through 83 years of age. However, you may need to be screened more often if early forms of precancerous polyps or small growths are found. Skin Cancer  Check your skin from head to toe regularly.  Tell your health care provider about any new moles or changes in moles, especially if there is a change in a mole's shape or color.  Also tell your health care provider if you have a mole that is larger than the size of a pencil eraser.  Always use sunscreen. Apply sunscreen liberally and repeatedly throughout the day.  Protect yourself by wearing long sleeves, pants, a  wide-brimmed hat, and sunglasses whenever you are outside. Heart disease, diabetes, and high blood pressure  High blood pressure causes heart disease and increases the risk of stroke. High blood pressure is more likely to develop in: ? People who have blood pressure in the high end of the normal range (130-139/85-89 mm Hg). ? People who are overweight or obese. ? People who are African American.  If you are 71-16 years of age, have your blood pressure checked every 3-5 years. If you are 59 years of age or older, have your blood pressure checked  every year. You should have your blood pressure measured twice-once when you are at a hospital or clinic, and once when you are not at a hospital or clinic. Record the average of the two measurements. To check your blood pressure when you are not at a hospital or clinic, you can use: ? An automated blood pressure machine at a pharmacy. ? A home blood pressure monitor.  If you are between 64 years and 70 years old, ask your health care provider if you should take aspirin to prevent strokes.  Have regular diabetes screenings. This involves taking a blood sample to check your fasting blood sugar level. ? If you are at a normal weight and have a low risk for diabetes, have this test once every three years after 83 years of age. ? If you are overweight and have a high risk for diabetes, consider being tested at a younger age or more often. Preventing infection Hepatitis B  If you have a higher risk for hepatitis B, you should be screened for this virus. You are considered at high risk for hepatitis B if: ? You were born in a country where hepatitis B is common. Ask your health care provider which countries are considered high risk. ? Your parents were born in a high-risk country, and you have not been immunized against hepatitis B (hepatitis B vaccine). ? You have HIV or AIDS. ? You use needles to inject street drugs. ? You live with someone who has hepatitis B. ? You have had sex with someone who has hepatitis B. ? You get hemodialysis treatment. ? You take certain medicines for conditions, including cancer, organ transplantation, and autoimmune conditions. Hepatitis C  Blood testing is recommended for: ? Everyone born from [birth year] through 1963. ? Anyone with known risk factors for hepatitis C. Sexually transmitted infections (STIs)  You should be screened for sexually transmitted infections (STIs) including gonorrhea and chlamydia if: ? You are sexually active and are younger than 83 years of  age. ? You are older than 83 years of age and your health care provider tells you that you are at risk for this type of infection. ? Your sexual activity has changed since you were last screened and you are at an increased risk for chlamydia or gonorrhea. Ask your health care provider if you are at risk.  If you do not have HIV, but are at risk, it may be recommended that you take a prescription medicine daily to prevent HIV infection. This is called pre-exposure prophylaxis (PrEP). You are considered at risk if: ? You are sexually active and do not regularly use condoms or know the HIV status of your partner(s). ? You take drugs by injection. ? You are sexually active with a partner who has HIV. Talk with your health care provider about whether you are at high risk of being infected with HIV. If you choose to begin PrEP, you should  first be tested for HIV. You should then be tested every 3 months for as long as you are taking PrEP. Pregnancy  If you are premenopausal and you may become pregnant, ask your health care provider about preconception counseling.  If you may become pregnant, take 400 to 800 micrograms (mcg) of folic acid every day.  If you want to prevent pregnancy, talk to your health care provider about birth control (contraception). Osteoporosis and menopause  Osteoporosis is a disease in which the bones lose minerals and strength with aging. This can result in serious bone fractures. Your risk for osteoporosis can be identified using a bone density scan.  If you are 18 years of age or older, or if you are at risk for osteoporosis and fractures, ask your health care provider if you should be screened.  Ask your health care provider whether you should take a calcium or vitamin D supplement to lower your risk for osteoporosis.  Menopause may have certain physical symptoms and risks.  Hormone replacement therapy may reduce some of these symptoms and risks. Talk to your health  care provider about whether hormone replacement therapy is right for you. Follow these instructions at home:  Schedule regular health, dental, and eye exams.  Stay current with your immunizations.  Do not use any tobacco products including cigarettes, chewing tobacco, or electronic cigarettes.  If you are pregnant, do not drink alcohol.  If you are breastfeeding, limit how much and how often you drink alcohol.  Limit alcohol intake to no more than 1 drink per day for nonpregnant women. One drink equals 12 ounces of beer, 5 ounces of wine, or 1 ounces of hard liquor.  Do not use street drugs.  Do not share needles.  Ask your health care provider for help if you need support or information about quitting drugs.  Tell your health care provider if you often feel depressed.  Tell your health care provider if you have ever been abused or do not feel safe at home. This information is not intended to replace advice given to you by your health care provider. Make sure you discuss any questions you have with your health care provider. Document Released: 11/14/2010 Document Revised: 10/07/2015 Document Reviewed: 02/02/2015 Elsevier Interactive Patient Education  2019 [employer].

## 2018-07-15 NOTE — Patient Instructions (Addendum)
BEFORE YOU LEAVE: -labs -xray -SI exercises -AWV with Raquel Sarna after labs/xray -follow up: 1 month if back pain persists, 3 months otherwise  Do the back exercises 4 days per week. Topical menthol, heat and as needed tylenol per dosing instructions for pain. Follow up in 2-4 weeks if persists.  Check the chest xray. Try nasal saline twice daily. If cough persists let us know and we would have you see a specialist.  We have ordered labs or studies at this visit. It can take up to 1-2 weeks for results and processing. IF results require follow up or explanation, we will call you with instructions. Clinically stable results will be released to your [facility]. If you have not heard from [place] or cannot find your results in [facility] in 2 weeks please contact our office at [phone].  If you are not yet signed up for [facility], please consider signing up.   We recommend the following healthy lifestyle for LIFE: 1) Small portions. But, make sure to get regular (at least 3 per day), healthy meals and small healthy snacks if needed.  2) Eat a healthy clean diet.   TRY TO EAT: -at least 5-7 servings of low sugar, colorful, and nutrient rich vegetables per day (not corn, potatoes or bananas.) -berries are the best choice if you wish to eat fruit (only eat small amounts if trying to reduce weight)  -lean meets (fish, white meat of chicken or [place]) -vegan proteins for some meals - beans or tofu, whole grains, nuts and seeds -Replace bad fats with good fats - good fats include: fish, nuts and seeds, canola oil, olive oil -small amounts of low fat or non fat dairy -small amounts of100 % whole grains - check the lables -drink plenty of water  AVOID: -SUGAR, sweets, anything with added sugar, corn syrup or sweeteners - must read labels as even foods advertised as "healthy" often are loaded with sugar -if you must have a sweetener, small amounts of stevia may be best -sweetened beverages and artificially  sweetened beverages -simple starches (rice, bread, potatoes, pasta, chips, etc - small amounts of 100% whole grains are ok) -red meat, pork, butter -fried foods, fast food, processed food, excessive dairy, eggs and coconut.  3)Get at least 150 minutes of sweaty aerobic exercise per week.  4)Reduce stress - consider counseling, meditation and relaxation to balance other aspects of your life.

## 2018-07-16 ENCOUNTER — Telehealth: Payer: Self-pay | Admitting: Family Medicine

## 2018-07-16 MED ORDER — DOXYCYCLINE HYCLATE 100 MG PO TABS: 100.0000 mg | ORAL_TABLET | Freq: Two times a day (BID) | ORAL | 0 refills | Status: DC

## 2018-07-16 MED ORDER — PREDNISONE 20 MG PO TABS: 40.0000 mg | ORAL_TABLET | Freq: Every day | ORAL | 0 refills | Status: DC

## 2018-07-16 NOTE — Telephone Encounter (Signed)
Copied from [place] [id number]. Topic: General - Other >> Jul 16, 2018  8:07 AM Lennox Solders wrote: Reason for CRM: pt is calling and was seen yesterday for bronchits. Pt has changed her mind and would like abx etc that dr Maudie Mercury offered her yesterday. [facility]

## 2018-07-16 NOTE — Telephone Encounter (Signed)
Rxs done. 

## 2018-09-02 ENCOUNTER — Ambulatory Visit (INDEPENDENT_AMBULATORY_CARE_PROVIDER_SITE_OTHER): Payer: Medicare Other | Admitting: Family Medicine

## 2018-09-02 ENCOUNTER — Encounter: Payer: Self-pay | Admitting: Family Medicine

## 2018-09-02 ENCOUNTER — Other Ambulatory Visit: Payer: Self-pay

## 2018-09-02 DIAGNOSIS — J988 Other specified respiratory disorders: Secondary | ICD-10-CM

## 2018-09-02 DIAGNOSIS — R062 Wheezing: Secondary | ICD-10-CM | POA: Diagnosis not present

## 2018-09-02 DIAGNOSIS — R05 Cough: Secondary | ICD-10-CM

## 2018-09-02 DIAGNOSIS — R059 Cough, unspecified: Secondary | ICD-10-CM

## 2018-09-02 MED ORDER — ALBUTEROL SULFATE HFA 108 (90 BASE) MCG/ACT IN AERS: 2.0000 | INHALATION_SPRAY | Freq: Four times a day (QID) | RESPIRATORY_TRACT | 0 refills | Status: DC | PRN

## 2018-09-02 MED ORDER — BENZONATATE 100 MG PO CAPS: 100.0000 mg | ORAL_CAPSULE | Freq: Three times a day (TID) | ORAL | 0 refills | Status: DC | PRN

## 2018-09-02 MED ORDER — DOXYCYCLINE HYCLATE 100 MG PO TABS: 100.0000 mg | ORAL_TABLET | Freq: Two times a day (BID) | ORAL | 0 refills | Status: DC

## 2018-09-02 MED ORDER — FLUTICASONE PROPIONATE HFA 44 MCG/ACT IN AERO: 2.0000 | INHALATION_SPRAY | Freq: Two times a day (BID) | RESPIRATORY_TRACT | 0 refills | Status: DC

## 2018-09-02 NOTE — Progress Notes (Addendum)
Virtual Visit via Video Note  I connected with Robin [name]  on 09/02/18 at 11:45 AM EDT by a video enabled telemedicine application and verified that I am speaking with the correct person using two identifiers. Unfortunately, she was not able to use the video component of the visit, so the vist was completed successfully in audio.  Location patient: home Location provider:work or home office Persons participating in the virtual visit: patient, provider  I discussed the limitations of evaluation and management by telemedicine and the availability of in person appointments. The patient expressed understanding and agreed to proceed.   HPI:  Cough and congestion: -x 3-4 days, but reports productive cough with green phlegm never resolved since she was sick last month, felt like it got better on the abx then came back -has had mild sore throat, cough with green phlegm, nasal congestion, sneezing, itchy eyes  -no fevers, malaise, SOB, hemoptysis, body aches -she has a history of asthma and she has had some wheezing with this at night occ -taking zyrtec -reports does not want to come into the office in light of the [place] pandemic. Reports has not seen anyone - her kids drop groceries at her door and they have been healthy.  ROS: See pertinent positives and negatives per HPI.  Past Medical History:  Diagnosis Date  . Allergy   . Arthritis   . Asthma   . Colon polyps    tubulovillous adenoma, 2010  . External hemorrhoids without mention of complication   . GERD (gastroesophageal reflux disease)   . Headache    eval with neuro in 2011  . Hyperlipemia   . Hyperparathyroidism [facility])    seeing Dr. Dwyane Dee in endocrinology  . Hypertension   . IBS (irritable bowel syndrome), GERD, hx colon polyps, chronic abd pain - followed by Dr. Sharlett Iles in GI 06/11/2012  . Osteoporosis 08/29/2011  . Vertigo    eval with neuro in 2011, brief recurrence 2016    Past Surgical History:  Procedure Laterality Date   . BACK SURGERY  1974  . CARPAL TUNNEL RELEASE  2/10  . CATARACT EXTRACTION, BILATERAL    . COLONOSCOPY    . EYE SURGERY  07/2017   bilateral cataract extraction  . POLYPECTOMY    . TONSILLECTOMY    . UPPER GASTROINTESTINAL ENDOSCOPY      Family History  Problem Relation Age of Onset  . Diabetes Brother   . Hypertension Brother   . Hypertension Father   . Stroke Father   . Diabetes Sister   . Hypertension Sister   . Heart disease Sister   . CAD Mother        Died of MI at age 88  . CAD Brother        Died of MI at age 28  . Colon cancer Neg Hx   . Hypercalcemia Neg Hx   . Colon polyps Neg Hx   . Esophageal cancer Neg Hx   . Stomach cancer Neg Hx   . Rectal cancer Neg Hx     SOCIAL HX: see hpi   Current Outpatient Medications:  .  albuterol (VENTOLIN HFA) 108 (90 Base) MCG/ACT inhaler, Inhale 2 puffs into the lungs every 6 (six) hours as needed., Disp: 1 Inhaler, Rfl: 0 .  AMBULATORY NON FORMULARY MEDICATION, Medication Name: GI cocktail 5-10 ML every 4-6 hours as needed for pain, Disp: 450 mL, Rfl: 1 .  amLODipine (NORVASC) 5 MG tablet, Take 1 tablet (5 mg total) by mouth daily.,  Disp: 90 tablet, Rfl: 3 .  benzonatate (TESSALON PERLES) 100 MG capsule, Take 1 capsule (100 mg total) by mouth 3 (three) times daily as needed., Disp: 20 capsule, Rfl: 0 .  cetirizine (ZYRTEC) 10 MG tablet, Take 10 mg by mouth daily., Disp: , Rfl:  .  Cholecalciferol (VITAMIN D) 2000 units CAPS, Take 2,000 Units by mouth daily., Disp: , Rfl:  .  doxycycline (VIBRA-TABS) 100 MG tablet, Take 1 tablet (100 mg total) by mouth 2 (two) times daily., Disp: 14 tablet, Rfl: 0 .  esomeprazole (NEXIUM) 40 MG capsule, Take 1 capsule (40 mg total) by mouth 2 (two) times daily before a meal., Disp: 60 capsule, Rfl: 3 .  ezetimibe (ZETIA) 10 MG tablet, TAKE 1 BY MOUTH DAILY, Disp: 90 tablet, Rfl: 3 .  fluticasone (FLOVENT HFA) 44 MCG/ACT inhaler, Inhale 2 puffs into the lungs 2 (two) times a day., Disp: 1  Inhaler, Rfl: 0 .  furosemide (LASIX) 20 MG tablet, Take 1 tablet (20 mg total) by mouth daily., Disp: 90 tablet, Rfl: 3 .  lisinopril (PRINIVIL,ZESTRIL) 40 MG tablet, Take 1 tablet (40 mg total) by mouth daily., Disp: 90 tablet, Rfl: 3 .  predniSONE (DELTASONE) 20 MG tablet, Take 2 tablets (40 mg total) by mouth daily with breakfast., Disp: 8 tablet, Rfl: 0 .  rosuvastatin (CRESTOR) 5 MG tablet, Take 1/2 tablet by mouth daily., Disp: 45 tablet, Rfl: 3  EXAM:  VITALS per patient if applicable: 25.0  GENERAL: alert, oriented, no audible signs of distress  LUNGS:no audibles gross SOB, gasping or wheezing  PSYCH/NEURO: pleasant and cooperative, no obvious depression or anxiety, speech and thought processing grossly intact  ASSESSMENT AND PLAN:  Discussed the following assessment and plan: More than 50% of over 10 minutes spent in total in caring for this patient was spent face-to-face counseling and/or coordinating care.    Respiratory infection  Cough  Wheeze  -we discussed possible serious and likely etiologies, workup and treatment, treatment risks and return precautions; query allergies with bronchitis vs unresolved resp or sinus infection vs other. Discussed potential for COVID 19, thought seems less likely given she has been completely isolated for weeks. Advised repeat CXR and pulm eval if neg, but she refused to come to office and prefers to try empiric tx first for possible bacterial sinusitis or resp infection with asthma exacerbation. Discussed risks. -after this discussion, Robin [name] opted for doxy x 7 days, flovent, prn alb, tessalon -follow up advised in 3-5 days -of course, we advised Iniya  to return or notify a doctor immediately if symptoms worsen or persist or new concerns arise.  I discussed the assessment and treatment plan with the patient. The patient was provided an opportunity to ask questions and all were answered. The patient agreed with the plan and demonstrated  an understanding of the instructions.    Follow up instructions: Advised assistant Wendie Simmer to help patient arrange the following: -follow up with Dr. Maudie Mercury Thursday or [place], DO

## 2018-09-02 NOTE — Telephone Encounter (Signed)
I called the pt and scheduled a phone visit for today at 11:45am.

## 2018-09-03 ENCOUNTER — Encounter: Payer: Self-pay | Admitting: Family Medicine

## 2018-09-03 NOTE — Telephone Encounter (Signed)
I called the pt and informed her of the message below

## 2018-09-06 ENCOUNTER — Telehealth: Payer: Self-pay | Admitting: Physician Assistant

## 2018-09-06 MED ORDER — ESOMEPRAZOLE MAGNESIUM 40 MG PO CPDR: 40.0000 mg | DELAYED_RELEASE_CAPSULE | Freq: Two times a day (BID) | ORAL | 3 refills | Status: DC

## 2018-09-06 NOTE — Telephone Encounter (Signed)
Patient would like a refill for esomeprazole (NEXIUM) 40 MG.  Sent to [facility]

## 2018-09-06 NOTE — Telephone Encounter (Signed)
Rx sent to [facility].

## 2018-09-09 ENCOUNTER — Other Ambulatory Visit: Payer: Self-pay

## 2018-09-09 ENCOUNTER — Ambulatory Visit (INDEPENDENT_AMBULATORY_CARE_PROVIDER_SITE_OTHER): Payer: Medicare Other | Admitting: Family Medicine

## 2018-09-09 DIAGNOSIS — M533 Sacrococcygeal disorders, not elsewhere classified: Secondary | ICD-10-CM | POA: Diagnosis not present

## 2018-09-09 DIAGNOSIS — M545 Low back pain, unspecified: Secondary | ICD-10-CM

## 2018-09-09 DIAGNOSIS — J988 Other specified respiratory disorders: Secondary | ICD-10-CM | POA: Diagnosis not present

## 2018-09-09 DIAGNOSIS — G8929 Other chronic pain: Secondary | ICD-10-CM | POA: Diagnosis not present

## 2018-09-09 NOTE — Progress Notes (Signed)
Virtual Visit via Video Note  I connected with Robin [name]  on 09/09/18 at 10:45 AM EDT by a video enabled telemedicine application and verified that I am speaking with the correct person using two identifiers. She was not able to use the video portion of this but we did complete to visit in audio.  Location patient: home Location provider:work or home office Persons participating in the virtual visit: patient, provider  I discussed the limitations of evaluation and management by telemedicine and the availability of in person appointments. The patient expressed understanding and agreed to proceed.   HPI:  Follow up Respiratory Infection: -reports doing so much better with only mild cough at times -finished abx, continues flovent -denies fevers, malaise, SOB, Wheezing, excessive SOB  New complaint of R lower back/buttock ("hip") pain: -for about 3-4 days this flare, but intermittent -thinks slept wrong and got up feeling stiff and having pain in R lower back/buttock -she had a flare back in March and reports is similar and wants to see specialist -reports pain is sharp and in the R SI joint area, worse with moving, better with being still, sometimes mod -severe -denies radiation to legs, weakness, numbness, malaise, bowel or bladder incontinence -tylenol helps, doing hep from prior flare, but doesn't help flare much ROS: See pertinent positives and negatives per HPI.  Past Medical History:  Diagnosis Date  . Allergy   . Arthritis   . Asthma   . Colon polyps    tubulovillous adenoma, 2010  . External hemorrhoids without mention of complication   . GERD (gastroesophageal reflux disease)   . Headache    eval with neuro in 2011  . Hyperlipemia   . Hyperparathyroidism [facility])    seeing Dr. Dwyane Dee in endocrinology  . Hypertension   . IBS (irritable bowel syndrome), GERD, hx colon polyps, chronic abd pain - followed by Dr. Sharlett Iles in GI 06/11/2012  . Osteoporosis 08/29/2011  . Vertigo    eval with neuro in 2011, brief recurrence 2016    Past Surgical History:  Procedure Laterality Date  . BACK SURGERY  1974  . CARPAL TUNNEL RELEASE  2/10  . CATARACT EXTRACTION, BILATERAL    . COLONOSCOPY    . EYE SURGERY  07/2017   bilateral cataract extraction  . POLYPECTOMY    . TONSILLECTOMY    . UPPER GASTROINTESTINAL ENDOSCOPY      Family History  Problem Relation Age of Onset  . Diabetes Brother   . Hypertension Brother   . Hypertension Father   . Stroke Father   . Diabetes Sister   . Hypertension Sister   . Heart disease Sister   . CAD Mother        Died of MI at age 98  . CAD Brother        Died of MI at age 33  . Colon cancer Neg Hx   . Hypercalcemia Neg Hx   . Colon polyps Neg Hx   . Esophageal cancer Neg Hx   . Stomach cancer Neg Hx   . Rectal cancer Neg Hx     SOCIAL HX: see hpi   Current Outpatient Medications:  .  albuterol (VENTOLIN HFA) 108 (90 Base) MCG/ACT inhaler, Inhale 2 puffs into the lungs every 6 (six) hours as needed., Disp: 1 Inhaler, Rfl: 0 .  AMBULATORY NON FORMULARY MEDICATION, Medication Name: GI cocktail 5-10 ML every 4-6 hours as needed for pain, Disp: 450 mL, Rfl: 1 .  amLODipine (NORVASC) 5 MG tablet, Take  1 tablet (5 mg total) by mouth daily., Disp: 90 tablet, Rfl: 3 .  benzonatate (TESSALON PERLES) 100 MG capsule, Take 1 capsule (100 mg total) by mouth 3 (three) times daily as needed., Disp: 20 capsule, Rfl: 0 .  cetirizine (ZYRTEC) 10 MG tablet, Take 10 mg by mouth daily., Disp: , Rfl:  .  Cholecalciferol (VITAMIN D) 2000 units CAPS, Take 2,000 Units by mouth daily., Disp: , Rfl:  .  doxycycline (VIBRA-TABS) 100 MG tablet, Take 1 tablet (100 mg total) by mouth 2 (two) times daily., Disp: 14 tablet, Rfl: 0 .  esomeprazole (NEXIUM) 40 MG capsule, Take 1 capsule (40 mg total) by mouth 2 (two) times daily before a meal., Disp: 180 capsule, Rfl: 3 .  ezetimibe (ZETIA) 10 MG tablet, TAKE 1 BY MOUTH DAILY, Disp: 90 tablet, Rfl: 3 .   fluticasone (FLOVENT HFA) 44 MCG/ACT inhaler, Inhale 2 puffs into the lungs 2 (two) times a day., Disp: 1 Inhaler, Rfl: 0 .  furosemide (LASIX) 20 MG tablet, Take 1 tablet (20 mg total) by mouth daily., Disp: 90 tablet, Rfl: 3 .  lisinopril (PRINIVIL,ZESTRIL) 40 MG tablet, Take 1 tablet (40 mg total) by mouth daily., Disp: 90 tablet, Rfl: 3 .  predniSONE (DELTASONE) 20 MG tablet, Take 2 tablets (40 mg total) by mouth daily with breakfast., Disp: 8 tablet, Rfl: 0 .  rosuvastatin (CRESTOR) 5 MG tablet, Take 1/2 tablet by mouth daily., Disp: 45 tablet, Rfl: 3  EXAM:  VITALS per patient if applicable:  GENERAL: alert, oriented, no audible signs of acute distress  LUNGS: no coughing, audible wheezing or audible signs of SOB or resp distress  MS: reports she can stand still without pain, walking hurts in area of concern, seated ext/int rotation of the hip hurts in SI region  PSYCH/NEURO: pleasant and cooperative, no obvious depression or anxiety, speech and thought processing grossly intact  ASSESSMENT AND PLAN:  Discussed the following assessment and plan:  Right-sided low back pain without sciatica, unspecified chronicity Chronic SI joint pain -we discussed possible serious and likely etiologies, workup and treatment, treatment risks and return precautions. Suspect SI joint pathology.  -after this discussion, Robin [name] opted for evaluation with PMR for further eval and confirmation dx and treatment. Referral placed. -follow up advised as needed here -of course, we advised Robin [name]  to return or notify a doctor immediately if symptoms worsen or persist or new concerns arise. -advised her to call if has not heard details of referral appt in 1 week -in interim hep and tylenol prn  Respiratory infection -glad mostly resolved -complete 2 week course inhaled steroids -follow up if any concerns   I discussed the assessment and treatment plan with the patient. The patient was provided an opportunity  to ask questions and all were answered. The patient agreed with the plan and demonstrated an understanding of the instructions.   The patient was advised to call back or seek an in-person evaluation if the symptoms worsen or if the condition fails to improve as anticipated.   Follow up instructions: Advised assistant Wendie Simmer to help patient arrange the following: -Cancel TOC with Dr. Jerilee Hoh. Schedule TOC with Dr. Ethlyn Gallery in June as she has changed her mind  Lucretia Kern, DO

## 2018-09-09 NOTE — Patient Instructions (Signed)
-  We placed a referral for you as discussed about the low back/hip pain. It usually takes about 1-2 weeks to process and schedule this referral. If you have not heard from [place] regarding this appointment in 2 weeks please contact our office.

## 2018-09-18 ENCOUNTER — Encounter: Payer: Self-pay | Admitting: Physical Medicine & Rehabilitation

## 2018-09-23 ENCOUNTER — Ambulatory Visit: Payer: Medicare Other | Admitting: Cardiology

## 2018-10-14 ENCOUNTER — Ambulatory Visit: Payer: Medicare Other | Admitting: Endocrinology

## 2018-10-15 ENCOUNTER — Ambulatory Visit: Payer: Medicare Other | Admitting: Internal Medicine

## 2018-10-21 ENCOUNTER — Encounter: Payer: Self-pay | Admitting: Family Medicine

## 2018-10-21 ENCOUNTER — Ambulatory Visit (INDEPENDENT_AMBULATORY_CARE_PROVIDER_SITE_OTHER): Payer: Medicare Other | Admitting: Family Medicine

## 2018-10-21 ENCOUNTER — Other Ambulatory Visit: Payer: Self-pay

## 2018-10-21 VITALS — BP 160/80 | HR 80 | Temp 98.3°F | Ht 62.0 in | Wt 160.6 lb

## 2018-10-21 DIAGNOSIS — N83202 Unspecified ovarian cyst, left side: Secondary | ICD-10-CM | POA: Diagnosis not present

## 2018-10-21 DIAGNOSIS — E559 Vitamin D deficiency, unspecified: Secondary | ICD-10-CM

## 2018-10-21 DIAGNOSIS — E213 Hyperparathyroidism, unspecified: Secondary | ICD-10-CM | POA: Diagnosis not present

## 2018-10-21 DIAGNOSIS — K219 Gastro-esophageal reflux disease without esophagitis: Secondary | ICD-10-CM | POA: Diagnosis not present

## 2018-10-21 DIAGNOSIS — Z20828 Contact with and (suspected) exposure to other viral communicable diseases: Secondary | ICD-10-CM | POA: Diagnosis not present

## 2018-10-21 DIAGNOSIS — M85852 Other specified disorders of bone density and structure, left thigh: Secondary | ICD-10-CM

## 2018-10-21 DIAGNOSIS — Z20822 Contact with and (suspected) exposure to covid-19: Secondary | ICD-10-CM

## 2018-10-21 DIAGNOSIS — M85851 Other specified disorders of bone density and structure, right thigh: Secondary | ICD-10-CM

## 2018-10-21 DIAGNOSIS — I10 Essential (primary) hypertension: Secondary | ICD-10-CM | POA: Diagnosis not present

## 2018-10-21 DIAGNOSIS — E7800 Pure hypercholesterolemia, unspecified: Secondary | ICD-10-CM

## 2018-10-21 DIAGNOSIS — K581 Irritable bowel syndrome with constipation: Secondary | ICD-10-CM | POA: Diagnosis not present

## 2018-10-21 LAB — COMPREHENSIVE METABOLIC PANEL
ALT: 9 U/L (ref 0–35)
AST: 14 U/L (ref 0–37)
Albumin: 4.3 g/dL (ref 3.5–5.2)
Alkaline Phosphatase: 92 U/L (ref 39–117)
BUN: 16 mg/dL (ref 6–23)
CO2: 27 mEq/L (ref 19–32)
Calcium: 10.8 mg/dL — ABNORMAL HIGH (ref 8.4–10.5)
Chloride: 103 mEq/L (ref 96–112)
Creatinine, Ser: 0.77 mg/dL (ref 0.40–1.20)
GFR: 71.62 mL/min (ref >60.00)
Glucose, Bld: 90 mg/dL (ref 70–99)
Potassium: 4 mEq/L (ref 3.5–5.1)
Sodium: 141 mEq/L (ref 135–145)
Total Bilirubin: 0.5 mg/dL (ref 0.2–1.2)
Total Protein: 6.8 g/dL (ref 6.0–8.3)

## 2018-10-21 LAB — VITAMIN D 25 HYDROXY (VIT D DEFICIENCY, FRACTURES): VITD: 53.82 ng/mL (ref 30.00–100.00)

## 2018-10-21 MED ORDER — ROSUVASTATIN CALCIUM 5 MG PO TABS: ORAL_TABLET | ORAL | 3 refills | Status: DC

## 2018-10-21 MED ORDER — EZETIMIBE 10 MG PO TABS: ORAL_TABLET | ORAL | 3 refills | Status: DC

## 2018-10-21 MED ORDER — LISINOPRIL 40 MG PO TABS: 40.0000 mg | ORAL_TABLET | Freq: Every day | ORAL | 3 refills | Status: DC

## 2018-10-21 MED ORDER — AMLODIPINE BESYLATE 5 MG PO TABS: 5.0000 mg | ORAL_TABLET | Freq: Every day | ORAL | 3 refills | Status: DC

## 2018-10-21 MED ORDER — FUROSEMIDE 20 MG PO TABS: 20.0000 mg | ORAL_TABLET | Freq: Every day | ORAL | 3 refills | Status: DC

## 2018-10-21 NOTE — Progress Notes (Signed)
Robin [name] DOB: 1933-11-11 Encounter date: 10/21/2018  This is a 85 y.o. female who presents to establish care. Chief Complaint  Patient presents with  . Establish Care   Last visit w [facility] was f/u resp infection (completed doxy, prednisone,tessalon perles, albuterol inhaler) and new R lower back/hip pain and was referred to PMR.  Last AWV 07/2018; bloodwork stable at that time.   History of present illness: Hasn't been out at all. This is first time she has been out in 6 weeks.   Back pain - tomorrow is first visit with PMR. If she is up and trying to do something it hurts pretty badly - feels this in R lower back/hip.   HTN (follows with cardiology): amlodipine 5mg  daily, lasix 20mg  daily,lisinopril 40mg  daily. Does not check at home regularly. Used to check on daily basis. Tree fell through house in 2018 and she knew bp elevated at that time so stopped checking after that point. This morning bp was 138/78. States it is always high when checked here.   GERD with gastritis, esophagitis JE:HUDJSH 40mg  BID  IBS (followed by GI - Dr. Royston Cowper): has issue all the time with constipation/bowel issues. Has been dealing with this for years.   Hyperparathyroid:followed by Dr. Ellison:last visit 04/2018 (follows back up in August)  Osteopenia:not doing anything specific for bone density at this time. Due for repeat bone density in 2021.   FW:YOVZC 10mg  daily,crestor 2.5mg  daily  Vit D HYI:FOYDXAJOI by Dr. Loanne Drilling; replaced sufficiently per last note.  CT abd/pelvis 03/2016 L ovarian cyst with suggestion for 1 year f/u; [place] ordered for 03/2017; not seeing gynecology. Patient doesn't recall having follow up US.   This is first year in several years that she hasn't had vertigo. Had extensive work up, but no specific cause except benign; did PT multiple times.   Rarely will have headache.   Had asthma as child. Takes zyrtec daily for allergies.    Past Medical History:   Diagnosis Date  . Allergy   . Arthritis   . Asthma   . Colon polyps    tubulovillous adenoma, 2010  . External hemorrhoids without mention of complication   . GERD (gastroesophageal reflux disease)   . Headache    eval with neuro in 2011  . Hyperlipemia   . Hyperparathyroidism [facility])    seeing Dr. Dwyane Dee in endocrinology  . Hypertension   . IBS (irritable bowel syndrome), GERD, hx colon polyps, chronic abd pain - followed by Dr. Sharlett Iles in GI 06/11/2012  . Osteoporosis 08/29/2011  . Vertigo    eval with neuro in 2011, brief recurrence 2016   Past Surgical History:  Procedure Laterality Date  . BACK SURGERY  1974  . CARPAL TUNNEL RELEASE Left 06/2008  . COLONOSCOPY    . EYE SURGERY  07/2017   bilateral cataract extraction  . POLYPECTOMY    . TONSILLECTOMY    . UPPER GASTROINTESTINAL ENDOSCOPY     Allergies  Allergen Reactions  . Prilosec [Omeprazole] Cough  . Amoxicillin     REACTION: rash  . Metronidazole     REACTION: rash  . Protonix [Pantoprazole Sodium] Swelling   Current Meds  Medication Sig  . albuterol (VENTOLIN HFA) 108 (90 Base) MCG/ACT inhaler Inhale 2 puffs into the lungs every 6 (six) hours as needed.  . AMBULATORY NON FORMULARY MEDICATION Medication Name: GI cocktail 5-10 ML every 4-6 hours as needed for pain  . amLODipine (NORVASC) 5 MG tablet Take 1 tablet (5  mg total) by mouth daily.  . cetirizine (ZYRTEC) 10 MG tablet Take 10 mg by mouth daily.  . Cholecalciferol (VITAMIN D) 2000 units CAPS Take 2,000 Units by mouth daily.  Marland Kitchen esomeprazole (NEXIUM) 40 MG capsule Take 1 capsule (40 mg total) by mouth 2 (two) times daily before a meal. (Patient taking differently: Take 40 mg by mouth as needed. )  . ezetimibe (ZETIA) 10 MG tablet TAKE 1 BY MOUTH DAILY  . furosemide (LASIX) 20 MG tablet Take 1 tablet (20 mg total) by mouth daily.  Marland Kitchen lisinopril (ZESTRIL) 40 MG tablet Take 1 tablet (40 mg total) by mouth daily.  . rosuvastatin (CRESTOR) 5 MG tablet Take  1/2 tablet by mouth daily.  . [DISCONTINUED] amLODipine (NORVASC) 5 MG tablet Take 1 tablet (5 mg total) by mouth daily.  . [DISCONTINUED] ezetimibe (ZETIA) 10 MG tablet TAKE 1 BY MOUTH DAILY  . [DISCONTINUED] furosemide (LASIX) 20 MG tablet Take 1 tablet (20 mg total) by mouth daily.  . [DISCONTINUED] lisinopril (PRINIVIL,ZESTRIL) 40 MG tablet Take 1 tablet (40 mg total) by mouth daily.  . [DISCONTINUED] rosuvastatin (CRESTOR) 5 MG tablet Take 1/2 tablet by mouth daily.   Social History   Tobacco Use  . Smoking status: Never Smoker  . Smokeless tobacco: Never Used  Substance Use Topics  . Alcohol use: No    Alcohol/week: 0.0 standard drinks   Family History  Problem Relation Age of Onset  . Diabetes Brother   . Hypertension Brother   . Stroke Brother 50  . Hypertension Father   . Stroke Father   . Diabetes Sister   . Hypertension Sister   . Heart disease Sister        heart failure  . CAD Mother        Died of MI at age 81  . CAD Brother        Died of MI at age 65  . Stroke Sister 7  . Stroke Brother 76  . Colon cancer Neg Hx   . Hypercalcemia Neg Hx   . Colon polyps Neg Hx   . Esophageal cancer Neg Hx   . Stomach cancer Neg Hx   . Rectal cancer Neg Hx      Review of Systems  Constitutional: Negative for chills, fatigue and fever.  Respiratory: Negative for cough, chest tightness, shortness of breath and wheezing.   Cardiovascular: Negative for chest pain, palpitations and leg swelling.  Gastrointestinal: Positive for constipation (chronic issue; does well with miralax daily).    Objective:  BP (!) 160/80 (BP Location: Left Arm, Patient Position: Sitting, Cuff Size: Normal) Comment: taken by pt-jaf  Pulse 80   Temp 98.3 F (36.8 C) (Oral)   Ht 5\' 2"  (1.575 m)   Wt 160 lb 9.6 oz (72.8 kg)   SpO2 98%   BMI 29.37 kg/m   Weight: 160 lb 9.6 oz (72.8 kg)   BP Readings from Last 3 Encounters:  10/21/18 (!) 160/80  07/15/18 (!) 142/80  04/23/18 136/78   Wt  Readings from Last 3 Encounters:  10/21/18 160 lb 9.6 oz (72.8 kg)  07/15/18 163 lb 3.2 oz (74 kg)  04/23/18 162 lb (73.5 kg)    EXAM:  Physical Exam Constitutional:      General: She is not in acute distress.    Appearance: She is well-developed.  Cardiovascular:     Rate and Rhythm: Normal rate and regular rhythm.     Heart sounds: Normal heart sounds. No murmur.  No friction rub.  Pulmonary:     Effort: Pulmonary effort is normal. No respiratory distress.     Breath sounds: Normal breath sounds. No wheezing or rales.  Musculoskeletal:     Right lower leg: No edema (trace).     Left lower leg: No edema (trace).  Neurological:     Mental Status: She is alert and oriented to person, place, and time.  Psychiatric:        Behavior: Behavior normal.      Assessment/Plan  1. Essential hypertension Recheck of blood pressure in the office was even higher today: 158/80.  Have encouraged her to check at home.  She is reluctant to change any medications at this time.  She does have follow-up with cardiology in the next month, so I have asked her to check blood pressure now until cardiology appointment and can discuss medication changes with them at that time. - Comprehensive metabolic panel; Future - amLODipine (NORVASC) 5 MG tablet; Take 1 tablet (5 mg total) by mouth daily.  Dispense: 90 tablet; Refill: 3 - furosemide (LASIX) 20 MG tablet; Take 1 tablet (20 mg total) by mouth daily.  Dispense: 90 tablet; Refill: 3 - lisinopril (ZESTRIL) 40 MG tablet; Take 1 tablet (40 mg total) by mouth daily.  Dispense: 90 tablet; Refill: 3 - Comprehensive metabolic panel  2. Gastroesophageal reflux disease, esophagitis presence not specified Continue with Nexium 40 mg twice daily if needed.  She is following with GI.  3. Irritable bowel syndrome with constipation Following with GI.  MiraLAX daily helps with regularity of bowel movements.  4. Osteopenia of both hips Noted on DEXA scan.  Patient  would not take treatment as indicated.  We will continue to discuss with her.  Would consider repeat DEXA in 2021, but would discuss with patient first due to treatment elected same.  In meanwhile, continue with weightbearing exercise and adequate calcium and vitamin D intake.  5. HYPERCHOLESTEROLEMIA Been stable. - ezetimibe (ZETIA) 10 MG tablet; TAKE 1 BY MOUTH DAILY  Dispense: 90 tablet; Refill: 3 - rosuvastatin (CRESTOR) 5 MG tablet; Take 1/2 tablet by mouth daily.  Dispense: 45 tablet; Refill: 3  6. Vitamin D deficiency Monitored by Dr. Loanne Drilling.  I have ordered to today's blood work since she is here, and in person.  We are trying to limit her exposure if possible for return visits/blood work. - VITAMIN D 25 Hydroxy (Vit-D Deficiency, Fractures); Future - VITAMIN D 25 Hydroxy (Vit-D Deficiency, Fractures)  7. Hyperparathyroidism [facility]) Managed by endocrinology.  See above. - PTH, Intact and Calcium  8. Left ovarian cyst This was incidental finding on CT abdomen and pelvis from November 2017.  Patient recalls being told about this, but states there was no additional follow-up imaging completed.  We will order ultrasound to follow-up. - US Pelvis Limited; Future  9. Close Exposure to Covid-19 Virus She was quite sick around time of previous office visits.  She would like to be tested for COVID antibodies. - SAR CoV2 Serology (COVID 19)AB(IGG)IA  Return in about 6 months (around 04/22/2019) for Chronic condition visit.    I discussed the assessment and treatment plan with the patient. The patient was provided an opportunity to ask questions and all were answered. The patient agreed with the plan and demonstrated an understanding of the instructions.     Micheline Rough, MD

## 2018-10-21 NOTE — Patient Instructions (Signed)
Monitor blood pressure a couple times/week at home. Record and bring with you to cardiology office. Bring home cuff as well at home.

## 2018-10-22 ENCOUNTER — Encounter: Payer: Self-pay | Admitting: Physical Medicine & Rehabilitation

## 2018-10-22 ENCOUNTER — Encounter: Payer: Medicare Other | Attending: Physical Medicine & Rehabilitation | Admitting: Physical Medicine & Rehabilitation

## 2018-10-22 ENCOUNTER — Other Ambulatory Visit: Payer: Self-pay

## 2018-10-22 VITALS — BP 166/71 | HR 72 | Ht 62.0 in | Wt 161.0 lb

## 2018-10-22 DIAGNOSIS — G8929 Other chronic pain: Secondary | ICD-10-CM | POA: Diagnosis not present

## 2018-10-22 DIAGNOSIS — M533 Sacrococcygeal disorders, not elsewhere classified: Secondary | ICD-10-CM | POA: Diagnosis not present

## 2018-10-22 LAB — PTH, INTACT AND CALCIUM
Calcium: 11 mg/dL — ABNORMAL HIGH (ref 8.6–10.4)
PTH: 34 pg/mL (ref 14–64)

## 2018-10-22 LAB — SAR COV2 SEROLOGY (COVID19)AB(IGG),IA: SARS CoV2 AB IGG: NEGATIVE

## 2018-10-22 NOTE — Progress Notes (Signed)
Subjective:    Patient ID: Robin [name], female    DOB: 05/10/1934, 85 y.o.   MRN: [id number]  HPI 85 yo female with 2-34mo hx of RIght > Left side low back and buttocks pain.  No apparent injury.  Seen by PCP July 15, 2018, was suspicious for RIght sacroiliac, pt had exercises prescribed but did not want to get on the floor because she was afraid she could not get up again.   Has LLE pain for 2-3 wks mainly in calf area, no tenderness, no new swelling (on "fluid pill" for leg swelling).  No falls or trauma to the area, this pain is mild  She feels the lower extremity pain is related to her back but denies any numbness or tingling.  Takes Tylenol up to 2 tablets 3 times a day  Treated for URI earlier this year Covid 19 neg  Pain Inventory Average Pain 5 Pain Right Now 5 My pain is constant and aching  In the last 24 hours, has pain interfered with the following? General activity 10 Relation with others 5 Enjoyment of life 10 What TIME of day is your pain at its worst? varies with activity Sleep (in general) Fair  Pain is worse with: walking, bending, standing and some activites Pain improves with: rest and heat/ice Relief from Meds: 0  Mobility walk without assistance  Function retired  Neuro/Psych No problems in this area  Prior Studies new  Physicians involved in your care new   Family History  Problem Relation Age of Onset  . Diabetes Brother   . Hypertension Brother   . Stroke Brother 26  . Hypertension Father   . Stroke Father   . Diabetes Sister   . Hypertension Sister   . Heart disease Sister        heart failure  . CAD Mother        Died of MI at age 20  . CAD Brother        Died of MI at age 7  . Stroke Sister 5  . Stroke Brother 19  . Colon cancer Neg Hx   . Hypercalcemia Neg Hx   . Colon polyps Neg Hx   . Esophageal cancer Neg Hx   . Stomach cancer Neg Hx   . Rectal cancer Neg Hx    Social History   Socioeconomic History  .  Marital status: Widowed    Spouse name: Not on file  . Number of children: 2  . Years of education: Not on file  . Highest education level: Not on file  Occupational History  . 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  Social Needs  . Financial resource strain: Not hard at all  . Food insecurity:    Worry: Never true    Inability: Never true  . Transportation needs:    Medical: No    Non-medical: No  Tobacco Use  . Smoking status: Never Smoker  . Smokeless tobacco: Never Used  Substance and Sexual Activity  . Alcohol use: No    Alcohol/week: 0.0 standard drinks  . Drug use: No  . Sexual activity: Not Currently  Lifestyle  . Physical activity:    Days per week: 0 days    Minutes per session: 0 min  . Stress: Not at all  Relationships  . Social connections:    Talks on phone: Not on file    Gets together: Not on file  Attends religious service: Not on file    Active member of club or organization: Not on file    Attends meetings of clubs or organizations: Not on file    Relationship status: Widowed  Other Topics Concern  . Not on file  Social History Narrative   Lives alone in a one story home.  Has 2 children, one son and one daughter with 2 grandkids and 1 great-grandchild.    Retired from working for a bank.     Education: high school.    Past Surgical History:  Procedure Laterality Date  . BACK SURGERY  1974  . CARPAL TUNNEL RELEASE Left 06/2008  . COLONOSCOPY    . EYE SURGERY  07/2017   bilateral cataract extraction  . POLYPECTOMY    . TONSILLECTOMY    . UPPER GASTROINTESTINAL ENDOSCOPY     Past Medical History:  Diagnosis Date  . Allergy   . Arthritis   . Asthma   . Colon polyps    tubulovillous adenoma, 2010  . External hemorrhoids without mention of complication   . GERD (gastroesophageal reflux disease)   . Headache    eval with neuro in 2011  . Hyperlipemia   . Hyperparathyroidism [facility])    seeing Dr. Dwyane Dee in  endocrinology  . Hypertension   . IBS (irritable bowel syndrome), GERD, hx colon polyps, chronic abd pain - followed by Dr. Sharlett Iles in GI 06/11/2012  . Osteoporosis 08/29/2011  . Vertigo    eval with neuro in 2011, brief recurrence 2016   BP (!) 166/71   Pulse 72   Ht 5\' 2"  (1.575 m)   Wt 161 lb (73 kg)   SpO2 97%   BMI 29.45 kg/m   Opioid Risk Score:   Fall Risk Score:  `1  Depression screen PHQ 2/9  Depression screen [facility] 2/9 10/22/2018 07/15/2018 02/13/2017 12/06/2015 08/10/2014 07/07/2014 09/30/2013  Decreased Interest 0 0 0 0 0 0 0  Down, Depressed, Hopeless 0 0 0 0 0 0 0  PHQ - 2 Score 0 0 0 0 0 0 0  Altered sleeping - 0 - - - - -  Tired, decreased energy - 0 - - - - -  Change in appetite - 0 - - - - -  Feeling bad or failure about yourself  - 0 - - - - -  Trouble concentrating - 0 - - - - -  Moving slowly or fidgety/restless - 0 - - - - -  Suicidal thoughts - 0 - - - - -  PHQ-9 Score - 0 - - - - -    Review of Systems  Constitutional: Negative.   HENT: Negative.   Eyes: Negative.   Respiratory: Negative.   Cardiovascular: Negative.   Gastrointestinal: Negative.   Endocrine: Negative.   Genitourinary: Negative.   Musculoskeletal: Positive for arthralgias and back pain.  Skin: Negative.   Allergic/Immunologic: Negative.   Neurological: Negative.   Hematological: Negative.   Psychiatric/Behavioral: Negative.   All other systems reviewed and are negative.      Objective:   Physical Exam Vitals signs and nursing note reviewed.  Constitutional:      Appearance: Normal appearance.  HENT:     Head: Normocephalic.     Mouth/Throat:     Mouth: Mucous membranes are moist.  Eyes:     Extraocular Movements: Extraocular movements intact.     Conjunctiva/sclera: Conjunctivae normal.     Pupils: Pupils are equal, round, and reactive to light.  Neck:  Musculoskeletal: Normal range of motion.  Cardiovascular:     Rate and Rhythm: Normal rate and regular rhythm.      Heart sounds: Normal heart sounds. No murmur.  Pulmonary:     Effort: Pulmonary effort is normal. No respiratory distress.     Breath sounds: Normal breath sounds. No stridor.  Abdominal:     General: Abdomen is flat. Bowel sounds are normal. There is no distension.     Palpations: Abdomen is soft. There is no mass.  Musculoskeletal:        General: No swelling or tenderness.  Skin:    General: Skin is warm and dry.  Neurological:     General: No focal deficit present.     Mental Status: She is alert and oriented to person, place, and time.  Psychiatric:        Mood and Affect: Mood normal.        Behavior: Behavior normal.     Motor strength is 5/5 bilateral deltoid, bicep, tricep, grip, hip flexor, knee extensor, ankle dorsiflexor Negative straight leg raise Sensations intact to light touch as well as pinprick bilateral upper and lower limbs Deep tendon reflexes are low normal bilateral upper and lower limbs Lumbar spine has mildly diminished flexion approximately 75% of normal, extension is 50% of normal lateral bending 50% of normal and twisting is 50% of normal Mild tenderness over the right L4-L5 lumbar paraspinal area there is moderate tenderness over the right PSIS area Faber's test refers pain to the right sacroiliac area when testing either the right or left side      Assessment & Plan:  1.  Right sacroiliac pain insidious onset.  CT scan from 2017 do not show any significant neck or iliac arthropathy in fact they do show L5-S1 facet arthropathy Will send to physical therapy Voltaren gel 4 times daily Return in 3 weeks if no better would do sacroiliac injection under fluoroscopic guidance

## 2018-10-22 NOTE — Patient Instructions (Addendum)
Try Voltaren gel for low back 4 times a day

## 2018-10-23 ENCOUNTER — Telehealth: Payer: Self-pay | Admitting: *Deleted

## 2018-10-23 NOTE — Telephone Encounter (Signed)
Patient left a message stating that it was suggested to the patient by Dr Letta Pate to pick up Voltaren Gel OTC at her pharmacy. She is concerned after reading the package at the store as to possible interaction with her GERD/heartburn, high blood pressure and the fact she takes a diuretic.  Please advise. She also states that the packaging did not indicate use for hip pain.

## 2018-10-23 NOTE — Telephone Encounter (Signed)
Not to worry the packaging discusses issues that are only present with oral intake.  Topical use will not cause the side effects Please ask patient not to ingest the gel

## 2018-10-24 NOTE — Telephone Encounter (Signed)
Notified. 

## 2018-10-31 ENCOUNTER — Other Ambulatory Visit: Payer: Self-pay

## 2018-10-31 ENCOUNTER — Ambulatory Visit: Payer: Medicare Other | Attending: Physical Medicine & Rehabilitation | Admitting: Physical Therapy

## 2018-10-31 ENCOUNTER — Encounter: Payer: Self-pay | Admitting: Physical Therapy

## 2018-10-31 DIAGNOSIS — M25651 Stiffness of right hip, not elsewhere classified: Secondary | ICD-10-CM | POA: Diagnosis not present

## 2018-10-31 DIAGNOSIS — R262 Difficulty in walking, not elsewhere classified: Secondary | ICD-10-CM

## 2018-10-31 DIAGNOSIS — M533 Sacrococcygeal disorders, not elsewhere classified: Secondary | ICD-10-CM | POA: Insufficient documentation

## 2018-10-31 NOTE — Therapy (Signed)
[place], [place], [address] Phone: [phone]   Fax:  [phone]  Physical Therapy Evaluation  Patient Details  Name: Robin [name] MRN: [id number] Date of Birth: Nov 18, 1933 Referring Provider (PT): Dr. Alysia Penna   Encounter Date: 10/31/2018  PT End of Session - 10/31/18 1410    Visit Number  1    Number of Visits  16    Date for PT Re-Evaluation  12/26/18    PT Start Time  1130    PT Stop Time  1220    PT Time Calculation (min)  50 min    Activity Tolerance  Patient tolerated treatment well    Behavior During Therapy  [facility] for tasks assessed/performed       Past Medical History:  Diagnosis Date  . Allergy   . Arthritis   . Asthma   . Colon polyps    tubulovillous adenoma, 2010  . External hemorrhoids without mention of complication   . GERD (gastroesophageal reflux disease)   . Headache    eval with neuro in 2011  . Hyperlipemia   . Hyperparathyroidism [facility])    seeing Dr. Dwyane Dee in endocrinology  . Hypertension   . IBS (irritable bowel syndrome), GERD, hx colon polyps, chronic abd pain - followed by Dr. Sharlett Iles in GI 06/11/2012  . Osteoporosis 08/29/2011  . Vertigo    eval with neuro in 2011, brief recurrence 2016    Past Surgical History:  Procedure Laterality Date  . BACK SURGERY  1974  . CARPAL TUNNEL RELEASE Left 06/2008  . COLONOSCOPY    . EYE SURGERY  07/2017   bilateral cataract extraction  . POLYPECTOMY    . TONSILLECTOMY    . UPPER GASTROINTESTINAL ENDOSCOPY      There were no vitals filed for this visit.   Subjective Assessment - 10/31/18 1133    Subjective  Patient has had pain in her back for many months.  In March her pain was worse, she has improved to some degree.  She has been using biofreeze and heating pad. She may have an injection next month.  She "can't do anything" (vacuum, clean her house, ADLs) for 3 mos ago.  Rt LE gives way when she stands from  sitting.  Walking and standing bothers her.    Pertinent History  vertigo, HTN, back surgery 1975, carpal tunnel    Limitations  Sitting;Lifting;Standing;Walking;House hold activities;Other (comment)   ADLs, sleep issues chronic   How long can you stand comfortably?  not comfortable    How long can you walk comfortably?  not trying to push it    Diagnostic tests  none    Patient Stated Goals  I want to feel 62 again    Currently in Pain?  Yes    Pain Score  3    can lean L and take pain to 1/10   Pain Location  Back    Pain Orientation  Right    Pain Descriptors / Indicators  Aching;Sore    Pain Type  Chronic pain    Pain Onset  More than a month ago    Pain Frequency  Intermittent    Aggravating Factors   activity    Pain Relieving Factors  leaning to L in sitting, rest, heating pad    Effect of Pain on Daily Activities  does not do much , lives alone , cant do her gardening or recreation    Multiple Pain Sites  No  [facility] PT Assessment - 10/31/18 0001      Assessment   Medical Diagnosis  sacroiliac joint pain, acute     Referring Provider (PT)  Dr. Alysia Penna    Onset Date/Surgical Date  --   chronic    Next MD Visit  next month     Prior Therapy  for vertigo       Precautions   Precautions  None      Restrictions   Weight Bearing Restrictions  No      Balance Screen   Has the patient fallen in the past 6 months  No    Has the patient had a decrease in activity level because of a fear of falling?   No    Is the patient reluctant to leave their home because of a fear of falling?   No      Home Environment   Living Environment  Private residence    Living Arrangements  Alone    Available Help at Discharge  Family    Type of [place] to enter      Prior Function   Level of Independence  Independent    Vocation  Retired    Medical illustrator     Leisure  family, friends nearby       Cognition   Overall  Cognitive Status  Within Functional Limits for tasks assessed      Observation/Other Assessments   Focus on Therapeutic Outcomes (FOTO)   64%      Sensation   Light Touch  Appears Intact      Posture/Postural Control   Posture Comments  leans to Rt in sitting.  In standing Rt shoulder and Rt hip higher , flexed spine       AROM   Lumbar Flexion  hands at knee, pain     Lumbar Extension  75% limited pain     Lumbar - Right Side Bend  25% PAin     Lumbar - Left Side Bend  25%    Lumbar - Right Rotation  50% pain     Lumbar - Left Rotation  50% pain       Strength   Right Hip Flexion  4/5    Left Hip Flexion  3+/5    Right Knee Flexion  5/5    Right Knee Extension  5/5    Left Knee Flexion  5/5    Left Knee Extension  5/5      Palpation   Palpation comment  highly sensitive to palpation  R > L at L5 to S1, min in gluteals, piriformis       Special Tests    Special Tests  Sacrolliac Tests    Sacroiliac Tests   Sacral Compression      Pelvic Dictraction   Findings  Unable to test      Sacral Compression   Findings  Positive    Side   Right        Objective measurements completed on examination: See above findings.     PT Education - 10/31/18 1410    Education Details  PT, POC, HEP ,ice vs heat    Person(s) Educated  Patient    Methods  Explanation;Demonstration    Comprehension  Verbalized understanding;Returned demonstration       PT Short Term Goals - 10/31/18 1443      PT SHORT TERM GOAL #1  Title  Pt will be I with HEP for posture, gentle strength and flexibility program    Time  4    Period  Weeks    Status  New    Target Date  11/28/18      PT SHORT TERM GOAL #2   Title  Pt will be able to sit with equal weightbearing and min pain for 10 min    Time  4    Period  Weeks    Status  New    Target Date  11/28/18      PT SHORT TERM GOAL #3   Title  Pt will be able to get in and out of the car with less difficulty, pain no more than min.    Time   4    Period  Weeks    Status  New    Target Date  11/28/18        PT Long Term Goals - 10/31/18 1445      PT LONG TERM GOAL #1   Title  The patient will be independent with progression of HEP for post d/c    Time  8    Period  Weeks    Status  New    Target Date  12/26/18      PT LONG TERM GOAL #2   Title  Pt will be able to report increased time with family and friends, min limitation due to pain in back    Time  8    Period  Weeks    Status  New    Target Date  12/26/18      PT LONG TERM GOAL #3   Title  FOTO score will improve to no more than 45% limited    Baseline  64%    Time  8    Period  Weeks    Status  New    Target Date  12/26/18      PT LONG TERM GOAL #4   Title  Pt will be able to lift groceries from the floor with no more than min difficulty    Time  8    Period  Weeks    Status  New    Target Date  12/26/18             Plan - 10/31/18 1448    Clinical Impression Statement  Patient presents for mod complexity eval of increasingly painful lumbosacral/SIJ pain.  She has been unable to stand, walk comfortably for about 3 mos.  She has significant trunk and hip stiffness. Pain with any amount of palpation for full assessment of pelvis, SIJ. Unable to sit symmetrically or lie prone without rotation or pain,  She was able to perform basice exercises to improve pelvic mobility and responded well to ice.    Personal Factors and Comorbidities  Age;Behavior Pattern;Fitness;Comorbidity 1;Comorbidity 2    Comorbidities  HTN, sedentary lifestyle, previous back surgery, lives alone, vertigo    Examination-Activity Limitations  Bed Mobility;Bend;Locomotion Level;Stand;Squat;Transfers;Sit;Dressing;Sleep;Carry    Examination-Participation Restrictions  Management consultant;Shop    Stability/Clinical Decision Making  Evolving/Moderate complexity    Clinical Decision Making  Moderate    Rehab Potential  Good    PT Frequency  2x / week    PT  Duration  8 weeks    PT Treatment/Interventions  ADLs/Self Care Home Management;Canalith Repostioning;Therapeutic activities;Therapeutic exercise;Neuromuscular re-education;Patient/family education;Vestibular;Dry needling;Passive range of motion;Manual techniques;Electrical Stimulation;Cryotherapy;Ultrasound;Taping;Moist Heat    PT Next Visit Plan  check basic  HEP    PT Home Exercise Plan  PPT, knee to chest, LTR , standing flexion at sink (bend knees)    Consulted and Agree with Plan of Care  Patient       Patient will benefit from skilled therapeutic intervention in order to improve the following deficits and impairments:  Abnormal gait, Decreased balance, Decreased activity tolerance, Decreased strength, Increased fascial restricitons, Impaired flexibility, Postural dysfunction, Pain, Obesity, Decreased range of motion, Hypomobility, Decreased mobility  Visit Diagnosis: 1. Sacroiliac joint pain   2. Difficulty in walking, not elsewhere classified   3. Stiffness of right hip, not elsewhere classified        Problem List Patient Active Problem List   Diagnosis Date Noted  . Cyst of left ovary 07/04/2016  . Hyperparathyroidism ([place]) 08/24/2014  . Slow transit constipation 01/13/2014  . IBS (irritable bowel syndrome), GERD, hx colon polyps, chronic abd pain - followed by Dr. Sharlett Iles in GI 06/11/2012  . Osteopenia 08/29/2011  . Vitamin D deficiency 08/29/2011  . GERD (gastroesophageal reflux disease) 11/23/2010  . HYPERCHOLESTEROLEMIA 09/09/2007  . Essential hypertension 09/09/2007    , 10/31/2018, 7:34 PM  [facility] [address] [place], [place], [address] Phone: [phone]   Fax:  [phone]  Name: Robin [name] MRN: [id number] Date of Birth: [birth year]   Raeford Razor, PT 10/31/18 7:34 PM Phone: [phone] Fax: [phone]

## 2018-10-31 NOTE — Patient Instructions (Signed)
Step 1  Step 2  Supine Posterior Pelvic Tilt reps: 10  sets: 2  hold: 10  daily: 2  weekly: 7 Setup  Begin by lying on your back with your knees bent and feet resting on the floor.  Movement  Slowly bend your low back and tilt your pelvis backward into the floor, then return to the starting position and repeat.  Tip  Make sure to only move your pelvis and low back and keep the rest of your body relaxed. Step 1  Step 2  Hooklying Single Knee to Chest Stretch reps: 3  sets: 2  hold: 30  daily: 2  weekly: 7 Setup  Begin lying on your back with your legs bent and feet resting on the ground. Movement  Place your hands around one thigh, then slowly pull your knee toward your chest until you feel a gentle stretch in your lower back. Tip  Make sure to keep your back relaxed during the stretch. Step 1  Step 2  Supine Lower Trunk Rotation reps: 10  sets: 1  hold: 10  daily: 2  weekly: 7 Setup  Begin lying on your back with your feet flat on the floor and your arms straight out to your sides. Movement  Lower your knees to one side, return to center, and repeat on the other side. Tip  Make sure to activate your core muscles and keep both of your shoulders in contact with the ground throughout the exercise. Step 1  Step 2  Standing Lumbar Spine Flexion Stretch Counter  reps: 5  sets: 1  hold: 30  daily: 1  weekly: 7 Setup  Begin in a standing upright position with your hands resting on a table. Movement  Slowly walk backward, bending at your hips and keeping your hands on the table, until you feel a stretch in your back. Tip  Make sure to only move in a pain-free range of motion during the exercise.

## 2018-11-11 ENCOUNTER — Encounter: Payer: BC Managed Care – PPO | Admitting: Physical Therapy

## 2018-11-11 ENCOUNTER — Ambulatory Visit
Admission: RE | Admit: 2018-11-11 | Discharge: 2018-11-11 | Disposition: A | Discharge status: Home / Self Care | Payer: Medicare Other | Source: Ambulatory Visit | Attending: Family Medicine | Admitting: Family Medicine

## 2018-11-11 DIAGNOSIS — N83202 Unspecified ovarian cyst, left side: Secondary | ICD-10-CM | POA: Diagnosis not present

## 2018-11-12 ENCOUNTER — Ambulatory Visit: Payer: Medicare Other | Admitting: Physical Therapy

## 2018-11-12 ENCOUNTER — Other Ambulatory Visit: Payer: Self-pay

## 2018-11-12 ENCOUNTER — Encounter: Payer: Self-pay | Admitting: Physical Therapy

## 2018-11-12 DIAGNOSIS — M533 Sacrococcygeal disorders, not elsewhere classified: Secondary | ICD-10-CM

## 2018-11-12 DIAGNOSIS — R262 Difficulty in walking, not elsewhere classified: Secondary | ICD-10-CM | POA: Diagnosis not present

## 2018-11-12 DIAGNOSIS — M25651 Stiffness of right hip, not elsewhere classified: Secondary | ICD-10-CM

## 2018-11-12 NOTE — Therapy (Signed)
[place], [place], [address] Phone: [phone]   Fax:  [phone]  Physical Therapy Treatment  Patient Details  Name: Robin [name] MRN: [id number] Date of Birth: Mar 07, 1934 Referring Provider (PT): Dr. Alysia Penna   Encounter Date: 11/12/2018  PT End of Session - 11/12/18 0905    Visit Number  2    Number of Visits  16    Date for PT Re-Evaluation  12/26/18    PT Start Time  0859    PT Stop Time  1000    PT Time Calculation (min)  61 min    Activity Tolerance  Patient tolerated treatment well    Behavior During Therapy  [facility] for tasks assessed/performed       Past Medical History:  Diagnosis Date  . Allergy   . Arthritis   . Asthma   . Colon polyps    tubulovillous adenoma, 2010  . External hemorrhoids without mention of complication   . GERD (gastroesophageal reflux disease)   . Headache    eval with neuro in 2011  . Hyperlipemia   . Hyperparathyroidism [facility])    seeing Dr. Dwyane Dee in endocrinology  . Hypertension   . IBS (irritable bowel syndrome), GERD, hx colon polyps, chronic abd pain - followed by Dr. Sharlett Iles in GI 06/11/2012  . Osteoporosis 08/29/2011  . Vertigo    eval with neuro in 2011, brief recurrence 2016    Past Surgical History:  Procedure Laterality Date  . BACK SURGERY  1974  . CARPAL TUNNEL RELEASE Left 06/2008  . COLONOSCOPY    . EYE SURGERY  07/2017   bilateral cataract extraction  . POLYPECTOMY    . TONSILLECTOMY    . UPPER GASTROINTESTINAL ENDOSCOPY      There were no vitals filed for this visit.  Subjective Assessment - 11/12/18 0901    Subjective  Pt is about the same.  Tried the exercises, they may have helped a bit.    Currently in Pain?  Yes    Pain Score  6     Pain Location  Back    Pain Orientation  Right;Lower    Pain Descriptors / Indicators  Aching    Pain Type  Chronic pain    Pain Onset  More than a month ago    Pain Frequency   Intermittent    Aggravating Factors   activity    Pain Relieving Factors  lean to L , rest, ice    Multiple Pain Sites  No       [facility] Adult PT Treatment/Exercise - 11/12/18 0001      Lumbar Exercises: Stretches   Single Knee to Chest Stretch  3 reps;30 seconds    Lower Trunk Rotation  10 seconds    Lower Trunk Rotation Limitations  x 10     Pelvic Tilt  10 reps    Piriformis Stretch  Right;2 reps    Piriformis Stretch Limitations  knee to opp shoulder     Figure 4 Stretch  2 reps;30 seconds    Figure 4 Stretch Limitations  seated , knee press out x 5       Lumbar Exercises: Aerobic   Nustep  L3 Ue and LE for 5 min       Lumbar Exercises: Supine   Clam  10 reps    Clam Limitations  green     Bridge  10 reps    Bridge Limitations  green band  Lumbar Exercises: Sidelying   Clam  Both;10 reps    Clam Limitations  green       Cryotherapy   Number Minutes Cryotherapy  10 Minutes    Cryotherapy Location  Lumbar Spine    Type of Cryotherapy  Ice pack      Ultrasound   Ultrasound Location  Rt L4-L5     Ultrasound Parameters  100% , 1.2 W/cm2, 1 MHz     Ultrasound Goals  Pain               PT Short Term Goals - 10/31/18 1443      PT SHORT TERM GOAL #1   Title  Pt will be I with HEP for posture, gentle strength and flexibility program    Time  4    Period  Weeks    Status  New    Target Date  11/28/18      PT SHORT TERM GOAL #2   Title  Pt will be able to sit with equal weightbearing and min pain for 10 min    Time  4    Period  Weeks    Status  New    Target Date  11/28/18      PT SHORT TERM GOAL #3   Title  Pt will be able to get in and out of the car with less difficulty, pain no more than min.    Time  4    Period  Weeks    Status  New    Target Date  11/28/18        PT Long Term Goals - 10/31/18 1445      PT LONG TERM GOAL #1   Title  The patient will be independent with progression of HEP for post d/c    Time  8    Period  Weeks     Status  New    Target Date  12/26/18      PT LONG TERM GOAL #2   Title  Pt will be able to report increased time with family and friends, min limitation due to pain in back    Time  8    Period  Weeks    Status  New    Target Date  12/26/18      PT LONG TERM GOAL #3   Title  FOTO score will improve to no more than 45% limited    Baseline  64%    Time  8    Period  Weeks    Status  New    Target Date  12/26/18      PT LONG TERM GOAL #4   Title  Pt will be able to lift groceries from the floor with no more than min difficulty    Time  8    Period  Weeks    Status  New    Target Date  12/26/18            Plan - 11/12/18 0914    Clinical Impression Statement  Patient continues to be limited in ability to transition from sitting to standing, do housework and ADLs. Pain addressed with stretching, used modalities for pain with good result.  Less pain post.    Personal Factors and Comorbidities  Age;Behavior Pattern;Fitness;Comorbidity 1;Comorbidity 2    Comorbidities  HTN, sedentary lifestyle, previous back surgery, lives alone, vertigo    Examination-Activity Limitations  Bed Mobility;Bend;Locomotion Level;Stand;Squat;Transfers;Sit;Dressing;Sleep;Carry    Examination-Participation Restrictions  Management consultant;Shop  PT Treatment/Interventions  ADLs/Self Care Home Management;Canalith Repostioning;Therapeutic activities;Therapeutic exercise;Neuromuscular re-education;Patient/family education;Vestibular;Dry needling;Passive range of motion;Manual techniques;Electrical Stimulation;Cryotherapy;Ultrasound;Taping;Moist Heat    PT Next Visit Plan  check basic HEP, Nu step, hip and core strength , modalities as needed    PT Home Exercise Plan  PPT, knee to chest, LTR , standing flexion at sink (bend knees)    Consulted and Agree with Plan of Care  Patient       Patient will benefit from skilled therapeutic intervention in order to improve the following deficits  and impairments:  Abnormal gait, Decreased balance, Decreased activity tolerance, Decreased strength, Increased fascial restricitons, Impaired flexibility, Postural dysfunction, Pain, Obesity, Decreased range of motion, Hypomobility, Decreased mobility  Visit Diagnosis: 1. Sacroiliac joint pain   2. Difficulty in walking, not elsewhere classified   3. Stiffness of right hip, not elsewhere classified        Problem List Patient Active Problem List   Diagnosis Date Noted  . Cyst of left ovary 07/04/2016  . Hyperparathyroidism ([place]) 08/24/2014  . Slow transit constipation 01/13/2014  . IBS (irritable bowel syndrome), GERD, hx colon polyps, chronic abd pain - followed by Dr. Sharlett Iles in GI 06/11/2012  . Osteopenia 08/29/2011  . Vitamin D deficiency 08/29/2011  . GERD (gastroesophageal reflux disease) 11/23/2010  . HYPERCHOLESTEROLEMIA 09/09/2007  . Essential hypertension 09/09/2007    Tieasha Larsen 11/12/2018, 9:55 AM  [facility] [address] [place], [place], [address] Phone: [phone]   Fax:  [phone]  Name: Robin [name] MRN: [id number] Date of Birth: 1933-12-03  Raeford Razor, PT 11/12/18 9:55 AM Phone: [phone] Fax: [phone]

## 2018-11-13 ENCOUNTER — Encounter: Payer: Self-pay | Admitting: Physical Therapy

## 2018-11-13 ENCOUNTER — Other Ambulatory Visit: Payer: Self-pay

## 2018-11-13 ENCOUNTER — Ambulatory Visit: Payer: Medicare Other | Attending: Physical Medicine & Rehabilitation | Admitting: Physical Therapy

## 2018-11-13 DIAGNOSIS — R262 Difficulty in walking, not elsewhere classified: Secondary | ICD-10-CM | POA: Diagnosis not present

## 2018-11-13 DIAGNOSIS — M25651 Stiffness of right hip, not elsewhere classified: Secondary | ICD-10-CM

## 2018-11-13 DIAGNOSIS — R42 Dizziness and giddiness: Secondary | ICD-10-CM | POA: Insufficient documentation

## 2018-11-13 DIAGNOSIS — M533 Sacrococcygeal disorders, not elsewhere classified: Secondary | ICD-10-CM | POA: Diagnosis not present

## 2018-11-13 DIAGNOSIS — R2681 Unsteadiness on feet: Secondary | ICD-10-CM | POA: Insufficient documentation

## 2018-11-13 NOTE — Therapy (Signed)
[place], [place], [address] Phone: [phone]   Fax:  [phone]  Physical Therapy Treatment  Patient Details  Name: Robin [name] MRN: [id number] Date of Birth: 1934-02-29 Referring Provider (PT): Dr. Alysia Penna   Encounter Date: 11/13/2018  PT End of Session - 11/13/18 1323    Visit Number  3    Number of Visits  16    Date for PT Re-Evaluation  12/26/18    PT Start Time  1316    PT Stop Time  1400    PT Time Calculation (min)  44 min    Activity Tolerance  Patient tolerated treatment well    Behavior During Therapy  [facility] for tasks assessed/performed       Past Medical History:  Diagnosis Date  . Allergy   . Arthritis   . Asthma   . Colon polyps    tubulovillous adenoma, 2010  . External hemorrhoids without mention of complication   . GERD (gastroesophageal reflux disease)   . Headache    eval with neuro in 2011  . Hyperlipemia   . Hyperparathyroidism [facility])    seeing Dr. Dwyane Dee in endocrinology  . Hypertension   . IBS (irritable bowel syndrome), GERD, hx colon polyps, chronic abd pain - followed by Dr. Sharlett Iles in GI 06/11/2012  . Osteoporosis 08/29/2011  . Vertigo    eval with neuro in 2011, brief recurrence 2016    Past Surgical History:  Procedure Laterality Date  . BACK SURGERY  1974  . CARPAL TUNNEL RELEASE Left 06/2008  . COLONOSCOPY    . EYE SURGERY  07/2017   bilateral cataract extraction  . POLYPECTOMY    . TONSILLECTOMY    . UPPER GASTROINTESTINAL ENDOSCOPY      There were no vitals filed for this visit.  Subjective Assessment - 11/13/18 1319    Subjective  When I got up the L side as hurting and it is the worst it has ever been.  Its 10/10.  Denies ED Took a Tylenol. The R side is better.    Currently in Pain?  Yes    Pain Score  10-Worst pain ever    Pain Location  Back    Pain Orientation  Left;Lower    Pain Descriptors / Indicators  Aching;Throbbing    Pain Type  Chronic pain    Pain Onset  More than a month ago    Pain Frequency  Intermittent    Aggravating Factors   activity, not sure what did it    Pain Relieving Factors  ice          [facility] Adult PT Treatment/Exercise - 11/13/18 0001      Lumbar Exercises: Stretches   Lower Trunk Rotation  10 seconds    Lower Trunk Rotation Limitations  x 5 with legs crossed each way     Pelvic Tilt  10 reps      Lumbar Exercises: Supine   Ab Set  10 reps    AB Set Limitations  with ball squeeze     Glut Set  10 reps    Clam  10 reps    Bent Knee Raise  10 reps      Cryotherapy   Number Minutes Cryotherapy  10 Minutes    Cryotherapy Location  Lumbar Spine    Type of Cryotherapy  Ice pack      Ultrasound   Ultrasound Location  L side L4-L5     Ultrasound  Parameters  100%, 1/2 W/cm2 1.Marland Kitchen0 MHz    Ultrasound Goals  Pain               PT Short Term Goals - 10/31/18 1443      PT SHORT TERM GOAL #1   Title  Pt will be I with HEP for posture, gentle strength and flexibility program    Time  4    Period  Weeks    Status  New    Target Date  11/28/18      PT SHORT TERM GOAL #2   Title  Pt will be able to sit with equal weightbearing and min pain for 10 min    Time  4    Period  Weeks    Status  New    Target Date  11/28/18      PT SHORT TERM GOAL #3   Title  Pt will be able to get in and out of the car with less difficulty, pain no more than min.    Time  4    Period  Weeks    Status  New    Target Date  11/28/18        PT Long Term Goals - 10/31/18 1445      PT LONG TERM GOAL #1   Title  The patient will be independent with progression of HEP for post d/c    Time  8    Period  Weeks    Status  New    Target Date  12/26/18      PT LONG TERM GOAL #2   Title  Pt will be able to report increased time with family and friends, min limitation due to pain in back    Time  8    Period  Weeks    Status  New    Target Date  12/26/18      PT LONG TERM GOAL #3    Title  FOTO score will improve to no more than 45% limited    Baseline  64%    Time  8    Period  Weeks    Status  New    Target Date  12/26/18      PT LONG TERM GOAL #4   Title  Pt will be able to lift groceries from the floor with no more than min difficulty    Time  8    Period  Weeks    Status  New    Target Date  12/26/18            Plan - 11/13/18 1314    Clinical Impression Statement  Increased pain today in L side, has not been this bad prior to today.  She was able to do mat exercises without increased pain.  Repeated US on L side since it seemed to help last visit.    PT Treatment/Interventions  ADLs/Self Care Home Management;Canalith Repostioning;Therapeutic activities;Therapeutic exercise;Neuromuscular re-education;Patient/family education;Vestibular;Dry needling;Passive range of motion;Manual techniques;Electrical Stimulation;Cryotherapy;Ultrasound;Taping;Moist Heat    PT Next Visit Plan  Nustep, hip, core strength and modalities as needed. Add stab to HEP    PT Home Exercise Plan  PPT, knee to chest, LTR    Consulted and Agree with Plan of Care  Patient       Patient will benefit from skilled therapeutic intervention in order to improve the following deficits and impairments:  Abnormal gait, Decreased balance, Decreased activity tolerance, Decreased strength, Increased fascial restricitons, Impaired flexibility, Postural dysfunction,  Pain, Obesity, Decreased range of motion, Hypomobility, Decreased mobility  Visit Diagnosis: 1. Sacroiliac joint pain   2. Difficulty in walking, not elsewhere classified   3. Stiffness of right hip, not elsewhere classified        Problem List Patient Active Problem List   Diagnosis Date Noted  . Cyst of left ovary 07/04/2016  . Hyperparathyroidism ([place]) 08/24/2014  . Slow transit constipation 01/13/2014  . IBS (irritable bowel syndrome), GERD, hx colon polyps, chronic abd pain - followed by Dr. Sharlett Iles in GI 06/11/2012  .  Osteopenia 08/29/2011  . Vitamin D deficiency 08/29/2011  . GERD (gastroesophageal reflux disease) 11/23/2010  . HYPERCHOLESTEROLEMIA 09/09/2007  . Essential hypertension 09/09/2007    PAA,JENNIFER 11/13/2018, 1:56 PM  [facility] [address] [place], [place], [address] Phone: [phone]   Fax:  [phone]  Name: Shanera Meske MRN: [id number] Date of Birth: 06-22-1933  Raeford Razor, PT 11/13/18 1:56 PM Phone: [phone] Fax: [phone]

## 2018-11-18 ENCOUNTER — Ambulatory Visit (INDEPENDENT_AMBULATORY_CARE_PROVIDER_SITE_OTHER): Payer: Medicare Other | Admitting: Endocrinology

## 2018-11-18 ENCOUNTER — Encounter: Payer: Self-pay | Admitting: Endocrinology

## 2018-11-18 ENCOUNTER — Other Ambulatory Visit: Payer: Self-pay

## 2018-11-18 VITALS — BP 152/70 | HR 74 | Ht 62.0 in | Wt 162.8 lb

## 2018-11-18 DIAGNOSIS — I10 Essential (primary) hypertension: Secondary | ICD-10-CM | POA: Diagnosis not present

## 2018-11-18 DIAGNOSIS — R609 Edema, unspecified: Secondary | ICD-10-CM | POA: Diagnosis not present

## 2018-11-18 DIAGNOSIS — E559 Vitamin D deficiency, unspecified: Secondary | ICD-10-CM | POA: Diagnosis not present

## 2018-11-18 DIAGNOSIS — E213 Hyperparathyroidism, unspecified: Secondary | ICD-10-CM | POA: Diagnosis not present

## 2018-11-18 MED ORDER — CHOLECALCIFEROL 25 MCG (1000 UT) PO TBDP: 1000.0000 [IU] | ORAL_TABLET | Freq: Every day | ORAL | 3 refills | Status: AC

## 2018-11-18 MED ORDER — FUROSEMIDE 40 MG PO TABS: 40.0000 mg | ORAL_TABLET | Freq: Every day | ORAL | 3 refills | Status: DC

## 2018-11-18 NOTE — Progress Notes (Signed)
Subjective:    Patient ID: Robin [name], female    DOB: 18-Jan-1934, 85 y.o.   MRN: [id number]  HPI The state of at least three ongoing medical problems is addressed today, with interval history of each noted here: Pt returns for f/u of hyperparathyroidism (probably combined primary and secondary; first noted in 2009; she has never had urolithiasis, PUD, pancreatitis, depression, or bony fracture; DEXA in 2016 showed osteopenia, and she declines f/u of this; she has mild hypercalcemia).  Denies cramps. This is a stable problem.   Vitamin-D deficiency: she denies numbness.  She takes vitamin-D, 2000 units per day.  This is a stable problem.   HTN: leg edema is unchanged.  This is a stable problem.   Past Medical History:  Diagnosis Date  . Allergy   . Arthritis   . Asthma   . Colon polyps    tubulovillous adenoma, 2010  . External hemorrhoids without mention of complication   . GERD (gastroesophageal reflux disease)   . Headache    eval with neuro in 2011  . Hyperlipemia   . Hyperparathyroidism [facility])    seeing Dr. Dwyane Dee in endocrinology  . Hypertension   . IBS (irritable bowel syndrome), GERD, hx colon polyps, chronic abd pain - followed by Dr. Sharlett Iles in GI 06/11/2012  . Osteoporosis 08/29/2011  . Vertigo    eval with neuro in 2011, brief recurrence 2016    Past Surgical History:  Procedure Laterality Date  . BACK SURGERY  1974  . CARPAL TUNNEL RELEASE Left 06/2008  . COLONOSCOPY    . EYE SURGERY  07/2017   bilateral cataract extraction  . POLYPECTOMY    . TONSILLECTOMY    . UPPER GASTROINTESTINAL ENDOSCOPY      Social History   Socioeconomic History  . Marital status: Widowed    Spouse name: Not on file  . Number of children: 2  . Years of education: Not on file  . Highest education level: Not on file  Occupational History  . 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  Social Needs  . Financial resource strain: Not hard at all  .  Food insecurity    Worry: Never true    Inability: Never true  . Transportation needs    Medical: No    Non-medical: No  Tobacco Use  . Smoking status: Never Smoker  . Smokeless tobacco: Never Used  Substance and Sexual Activity  . Alcohol use: No    Alcohol/week: 0.0 standard drinks  . Drug use: No  . Sexual activity: Not Currently  Lifestyle  . Physical activity    Days per week: 0 days    Minutes per session: 0 min  . Stress: Not at all  Relationships  . Social Herbalist on phone: Not on file    Gets together: Not on file    Attends religious service: Not on file    Active member of club or organization: Not on file    Attends meetings of clubs or organizations: Not on file    Relationship status: Widowed  . Intimate partner violence    Fear of current or ex partner: Not on file    Emotionally abused: Not on file    Physically abused: Not on file    Forced sexual activity: Not on file  Other Topics Concern  . Not on file  Social History Narrative   Lives alone in a one story home.  Has 2 children, one son and one daughter with 2 grandkids and 1 great-grandchild.    Retired from working for a bank.     Education: high school.     Current Outpatient Medications on File Prior to Visit  Medication Sig Dispense Refill  . albuterol (VENTOLIN HFA) 108 (90 Base) MCG/ACT inhaler Inhale 2 puffs into the lungs every 6 (six) hours as needed. 1 Inhaler 0  . AMBULATORY NON FORMULARY MEDICATION Medication Name: GI cocktail 5-10 ML every 4-6 hours as needed for pain 450 mL 1  . amLODipine (NORVASC) 5 MG tablet Take 1 tablet (5 mg total) by mouth daily. 90 tablet 3  . cetirizine (ZYRTEC) 10 MG tablet Take 10 mg by mouth daily.    Marland Kitchen esomeprazole (NEXIUM) 40 MG capsule Take 1 capsule (40 mg total) by mouth 2 (two) times daily before a meal. (Patient taking differently: Take 40 mg by mouth as needed. ) 180 capsule 3  . ezetimibe (ZETIA) 10 MG tablet TAKE 1 BY MOUTH DAILY 90  tablet 3  . lisinopril (ZESTRIL) 40 MG tablet Take 1 tablet (40 mg total) by mouth daily. 90 tablet 3  . rosuvastatin (CRESTOR) 5 MG tablet Take 1/2 tablet by mouth daily. 45 tablet 3   No current facility-administered medications on file prior to visit.     Allergies  Allergen Reactions  . Prilosec [Omeprazole] Cough  . Amoxicillin     REACTION: rash  . Metronidazole     REACTION: rash  . Protonix [Pantoprazole Sodium] Swelling    Family History  Problem Relation Age of Onset  . Diabetes Brother   . Hypertension Brother   . Stroke Brother 12  . Hypertension Father   . Stroke Father   . Diabetes Sister   . Hypertension Sister   . Heart disease Sister        heart failure  . CAD Mother        Died of MI at age 64  . CAD Brother        Died of MI at age 41  . Stroke Sister 6  . Stroke Brother 69  . Colon cancer Neg Hx   . Hypercalcemia Neg Hx   . Colon polyps Neg Hx   . Esophageal cancer Neg Hx   . Stomach cancer Neg Hx   . Rectal cancer Neg Hx     BP (!) 152/70 (BP Location: Left Arm, Patient Position: Sitting, Cuff Size: Normal)   Pulse 74   Ht 5\' 2"  (1.575 m)   Wt 162 lb 12.8 oz (73.8 kg)   SpO2 95%   BMI 29.78 kg/m    Review of Systems Denies sob and chest pain.        Objective:   Physical Exam VITAL SIGNS:  See vs page GENERAL: no distress Ext: 1+ bilat leg edema.    Lab Results  Component Value Date   PTH 34 10/21/2018   CALCIUM 11.0 (H) 10/21/2018   CALCIUM 10.8 (H) 10/21/2018   Lab Results  Component Value Date   CREATININE 0.77 10/21/2018   BUN 16 10/21/2018   NA 141 10/21/2018   K 4.0 10/21/2018   CL 103 10/21/2018   CO2 27 10/21/2018       Assessment & Plan:  Vit-D def: slightly overreplaced Hyperparathyroidism: well-controlled Edema: persistent HTN: she needs increased rx.   Increased lasix might help.    Patient Instructions  Please reduce the vitamin-D to 1000 units daily, and:  I  have sent a prescription to your  pharmacy, to double the furosemide.   Unless the calcium level goes over 12, you don't need surgery.  Please come back for a follow-up appointment in 2-3 months.

## 2018-11-18 NOTE — Patient Instructions (Addendum)
Please reduce the vitamin-D to 1000 units daily, and:  I have sent a prescription to your pharmacy, to double the furosemide.   Unless the calcium level goes over 12, you don't need surgery.  Please come back for a follow-up appointment in 2-3 months.

## 2018-11-19 ENCOUNTER — Ambulatory Visit: Payer: Medicare Other | Admitting: Physical Therapy

## 2018-11-19 DIAGNOSIS — R42 Dizziness and giddiness: Secondary | ICD-10-CM

## 2018-11-19 DIAGNOSIS — M25651 Stiffness of right hip, not elsewhere classified: Secondary | ICD-10-CM | POA: Diagnosis not present

## 2018-11-19 DIAGNOSIS — R262 Difficulty in walking, not elsewhere classified: Secondary | ICD-10-CM | POA: Diagnosis not present

## 2018-11-19 DIAGNOSIS — R2681 Unsteadiness on feet: Secondary | ICD-10-CM | POA: Diagnosis not present

## 2018-11-19 DIAGNOSIS — M533 Sacrococcygeal disorders, not elsewhere classified: Secondary | ICD-10-CM | POA: Diagnosis not present

## 2018-11-19 NOTE — Therapy (Signed)
[place], [place], [address] Phone: [phone]   Fax:  [phone]  Physical Therapy Treatment  Patient Details  Name: Robin [name] MRN: [id number] Date of Birth: Oct 06, 1933 Referring Provider (PT): Dr. Alysia Penna   Encounter Date: 11/19/2018  PT End of Session - 11/19/18 1039    Visit Number  4    Number of Visits  16    Date for PT Re-Evaluation  12/26/18    PT Start Time  1032    PT Stop Time  1120    PT Time Calculation (min)  48 min    Activity Tolerance  Patient tolerated treatment well    Behavior During Therapy  [facility] for tasks assessed/performed       Past Medical History:  Diagnosis Date  . Allergy   . Arthritis   . Asthma   . Colon polyps    tubulovillous adenoma, 2010  . External hemorrhoids without mention of complication   . GERD (gastroesophageal reflux disease)   . Headache    eval with neuro in 2011  . Hyperlipemia   . Hyperparathyroidism [facility])    seeing Dr. Dwyane Dee in endocrinology  . Hypertension   . IBS (irritable bowel syndrome), GERD, hx colon polyps, chronic abd pain - followed by Dr. Sharlett Iles in GI 06/11/2012  . Osteoporosis 08/29/2011  . Vertigo    eval with neuro in 2011, brief recurrence 2016    Past Surgical History:  Procedure Laterality Date  . BACK SURGERY  1974  . CARPAL TUNNEL RELEASE Left 06/2008  . COLONOSCOPY    . EYE SURGERY  07/2017   bilateral cataract extraction  . POLYPECTOMY    . TONSILLECTOMY    . UPPER GASTROINTESTINAL ENDOSCOPY      There were no vitals filed for this visit.  Subjective Assessment - 11/19/18 1036    Subjective  Pt arriving to therapy reporting 6/10 low back pain. Pt reporting 10/10 getting in and out of car. Pt reporting taking tylenol prior to therapy    Pertinent History  vertigo, HTN, back surgery 1975, carpal tunnel    Limitations  Sitting;Lifting;Standing;Walking;House hold activities;Other (comment)    How  long can you stand comfortably?  not comfortable    How long can you walk comfortably?  not trying to push it    Patient Stated Goals  I want to feel 62 again    Currently in Pain?  Yes    Pain Score  6     Pain Location  Back    Pain Orientation  Right;Left;Lower    Pain Descriptors / Indicators  Aching    Pain Type  Chronic pain    Pain Onset  More than a month ago                       [facility] Adult PT Treatment/Exercise - 11/19/18 0001      Lumbar Exercises: Stretches   Single Knee to Chest Stretch  3 reps;20 seconds    Lower Trunk Rotation  10 seconds    Lower Trunk Rotation Limitations  x 5 with legs crossed each way     Pelvic Tilt  10 reps      Lumbar Exercises: Supine   Ab Set  10 reps    AB Set Limitations  with ball squeeze     Glut Set  10 reps    Clam  10 reps    Clam Limitations  green  Bent Knee Raise  10 reps    Other Supine Lumbar Exercises  PPT x 10 reps      Cryotherapy   Number Minutes Cryotherapy  8 Minutes    Cryotherapy Location  Lumbar Spine    Type of Cryotherapy  Ice pack      Ultrasound   Ultrasound Location  Bilateral L4-5     Ultrasound Parameters  100% 1.2 W/cm2, 1.0 MHz x 10 minutes    Ultrasound Goals  Pain               PT Short Term Goals - 11/19/18 1040      PT SHORT TERM GOAL #1   Title  Pt will be I with HEP for posture, gentle strength and flexibility program    Time  4    Period  Weeks    Status  On-going      PT SHORT TERM GOAL #2   Title  Pt will be able to sit with equal weightbearing and min pain for 10 min    Time  4    Period  Weeks    Target Date  11/28/18      PT SHORT TERM GOAL #3   Title  Pt will be able to get in and out of the car with less difficulty, pain no more than min.    Time  4    Period  Weeks      PT SHORT TERM GOAL #4   Title  The patient will improve neck AROM from 40 deg rotation bilaterally to 50 degrees to demo improved head motion needed for vestibular adaptation.     Time  4    Period  Weeks    Status  New      PT SHORT TERM GOAL #5   Title  The patient will tolerate rolling R<>L with dizziness < or equal to 2/10 (improved from 5/10 at eval).    Time  4    Period  Weeks    Status  New        PT Long Term Goals - 10/31/18 1445      PT LONG TERM GOAL #1   Title  The patient will be independent with progression of HEP for post d/c    Time  8    Period  Weeks    Status  New    Target Date  12/26/18      PT LONG TERM GOAL #2   Title  Pt will be able to report increased time with family and friends, min limitation due to pain in back    Time  8    Period  Weeks    Status  New    Target Date  12/26/18      PT LONG TERM GOAL #3   Title  FOTO score will improve to no more than 45% limited    Baseline  64%    Time  8    Period  Weeks    Status  New    Target Date  12/26/18      PT LONG TERM GOAL #4   Title  Pt will be able to lift groceries from the floor with no more than min difficulty    Time  8    Period  Weeks    Status  New    Target Date  12/26/18            Plan - 11/19/18 1115  Clinical Impression Statement  Pt reporting bilateral pain in low back today of 6/10. Pt tolerating exercises well with no reports of increasing pain during session. [place] repeated followed by ice pack. Pt reporting pain has decreased to 4/10 at end of session.    Personal Factors and Comorbidities  Age;Behavior Pattern;Fitness;Comorbidity 1;Comorbidity 2    Comorbidities  HTN, sedentary lifestyle, previous back surgery, lives alone, vertigo    Examination-Activity Limitations  Bed Mobility;Bend;Locomotion Level;Stand;Squat;Transfers;Sit;Dressing;Sleep;Carry    Examination-Participation Restrictions  Management consultant;Shop    Clinical Decision Making  Moderate    Rehab Potential  Good    PT Frequency  2x / week    PT Duration  8 weeks    PT Treatment/Interventions  ADLs/Self Care Home Management;Canalith  Repostioning;Therapeutic activities;Therapeutic exercise;Neuromuscular re-education;Patient/family education;Vestibular;Dry needling;Passive range of motion;Manual techniques;Electrical Stimulation;Cryotherapy;Ultrasound;Taping;Moist Heat    PT Next Visit Plan  Nustep, hip, core strength and modalities as needed. Add stab to HEP    PT Home Exercise Plan  PPT, knee to chest, LTR, clam shells with green theraband    Consulted and Agree with Plan of Care  Patient       Patient will benefit from skilled therapeutic intervention in order to improve the following deficits and impairments:  Abnormal gait, Decreased balance, Decreased activity tolerance, Decreased strength, Increased fascial restricitons, Impaired flexibility, Postural dysfunction, Pain, Obesity, Decreased range of motion, Hypomobility, Decreased mobility  Visit Diagnosis: 1. Sacroiliac joint pain   2. Difficulty in walking, not elsewhere classified   3. Stiffness of right hip, not elsewhere classified   4. Dizziness and giddiness   5. Unsteadiness on feet        Problem List Patient Active Problem List   Diagnosis Date Noted  . Cyst of left ovary 07/04/2016  . Hyperparathyroidism ([place]) 08/24/2014  . Slow transit constipation 01/13/2014  . IBS (irritable bowel syndrome), GERD, hx colon polyps, chronic abd pain - followed by Dr. Sharlett Iles in GI 06/11/2012  . Osteopenia 08/29/2011  . Vitamin D deficiency 08/29/2011  . GERD (gastroesophageal reflux disease) 11/23/2010  . HYPERCHOLESTEROLEMIA 09/09/2007  . Essential hypertension 09/09/2007    Oretha Caprice, PT 11/19/2018, 11:20 AM  [facility] [address] [place], [place], [address] Phone: [phone]   Fax:  [phone]  Name: Nataleigh Griffin MRN: [id number] Date of Birth: 05/09/1934

## 2018-11-20 ENCOUNTER — Telehealth: Payer: Self-pay | Admitting: Gastroenterology

## 2018-11-20 NOTE — Telephone Encounter (Signed)
Informed patient that it has been a while since she has been seen in the office for her constipation and I cannot see we prescribed Trulance recently. Patient states she has had samples of Trulance since 03/2017 appt with Dr. Fuller Plan and takes them as needed. Patient wanted to get more samples. I informed patient she needs a office appt but can take Miralax 1-2 x daily to titrate her bowel movements. Patient states she has been taking it as needed but will increase for now until appt. Appt scheduled with Dr. Fuller Plan for 12/18/18.

## 2018-11-20 NOTE — Telephone Encounter (Signed)
Pt has been experiencing constipation and wants to know if we have samples of trulance that we can give her.

## 2018-11-21 ENCOUNTER — Other Ambulatory Visit: Payer: Self-pay | Admitting: Family Medicine

## 2018-11-21 ENCOUNTER — Ambulatory Visit: Payer: Medicare Other | Admitting: Physical Therapy

## 2018-11-21 ENCOUNTER — Other Ambulatory Visit: Payer: Self-pay

## 2018-11-21 DIAGNOSIS — M533 Sacrococcygeal disorders, not elsewhere classified: Secondary | ICD-10-CM

## 2018-11-21 DIAGNOSIS — R2681 Unsteadiness on feet: Secondary | ICD-10-CM | POA: Diagnosis not present

## 2018-11-21 DIAGNOSIS — R262 Difficulty in walking, not elsewhere classified: Secondary | ICD-10-CM

## 2018-11-21 DIAGNOSIS — M25651 Stiffness of right hip, not elsewhere classified: Secondary | ICD-10-CM

## 2018-11-21 DIAGNOSIS — Z1231 Encounter for screening mammogram for malignant neoplasm of breast: Secondary | ICD-10-CM

## 2018-11-21 DIAGNOSIS — R42 Dizziness and giddiness: Secondary | ICD-10-CM

## 2018-11-21 NOTE — Therapy (Signed)
[place], [place], [address] Phone: [phone]   Fax:  [phone]  Physical Therapy Treatment  Patient Details  Name: Robin [name] MRN: [id number] Date of Birth: 09-14-1933 Referring Provider (PT): Dr. Alysia Penna   Encounter Date: 11/21/2018    Past Medical History:  Diagnosis Date  . Allergy   . Arthritis   . Asthma   . Colon polyps    tubulovillous adenoma, 2010  . External hemorrhoids without mention of complication   . GERD (gastroesophageal reflux disease)   . Headache    eval with neuro in 2011  . Hyperlipemia   . Hyperparathyroidism [facility])    seeing Dr. Dwyane Dee in endocrinology  . Hypertension   . IBS (irritable bowel syndrome), GERD, hx colon polyps, chronic abd pain - followed by Dr. Sharlett Iles in GI 06/11/2012  . Osteoporosis 08/29/2011  . Vertigo    eval with neuro in 2011, brief recurrence 2016    Past Surgical History:  Procedure Laterality Date  . BACK SURGERY  1974  . CARPAL TUNNEL RELEASE Left 06/2008  . COLONOSCOPY    . EYE SURGERY  07/2017   bilateral cataract extraction  . POLYPECTOMY    . TONSILLECTOMY    . UPPER GASTROINTESTINAL ENDOSCOPY      There were no vitals filed for this visit.  Subjective Assessment - 11/21/18 0917    Subjective  Pt arriving to therapy reporting 5/10 low back pain.  Pt reporting taking tylenol prior to therapy. Pt describing having E-stim for her back pain in the past which seemed to help. We discussed trying IFC today at end of session.    Pertinent History  vertigo, HTN, back surgery 1975, carpal tunnel    Limitations  Sitting;Lifting;Standing;Walking;House hold activities;Other (comment)    How long can you stand comfortably?  not comfortable    How long can you walk comfortably?  not trying to push it    Diagnostic tests  none    Patient Stated Goals  I want to feel 62 again    Currently in Pain?  Yes    Pain Score  5     Pain  Location  Back    Pain Orientation  Right;Left;Lower    Pain Descriptors / Indicators  Aching;Sore    Pain Type  Chronic pain    Pain Onset  More than a month ago    Pain Frequency  Intermittent                       [facility] Adult PT Treatment/Exercise - 11/21/18 0001      Lumbar Exercises: Stretches   Single Knee to Chest Stretch  3 reps;20 seconds    Lower Trunk Rotation  10 seconds    Lower Trunk Rotation Limitations  x 5 with legs crossed each way     Pelvic Tilt  10 reps      Lumbar Exercises: Seated   Other Seated Lumbar Exercises  marching with core stabilization       Lumbar Exercises: Supine   Ab Set  10 reps    AB Set Limitations  with ball squeeze     Clam  10 reps    Clam Limitations  red    Bent Knee Raise  10 reps    Other Supine Lumbar Exercises  PPT x 10 reps      Modalities   Modalities  Cryotherapy;Electrical Stimulation      Cryotherapy  Number Minutes Cryotherapy  8 Minutes    Cryotherapy Location  Lumbar Spine    Type of Cryotherapy  Ice pack      Electrical Stimulation   Electrical Stimulation Location  lumbar spine    Electrical Stimulation Action  IFC    Electrical Stimulation Parameters  80-150 Hz x 15 minutes, intensity to tolerance    Electrical Stimulation Goals  Pain             PT Education - 11/21/18 0919    Education Details  Discussed E-stim and benefits    Person(s) Educated  Patient    Methods  Explanation    Comprehension  Verbalized understanding       PT Short Term Goals - 11/21/18 0923      PT SHORT TERM GOAL #1   Title  Pt will be I with HEP for posture, gentle strength and flexibility program    Time  4    Period  Weeks    Status  On-going      PT SHORT TERM GOAL #2   Title  Pt will be able to sit with equal weightbearing and min pain for 10 min    Time  4    Period  Weeks    Status  On-going      PT SHORT TERM GOAL #3   Title  Pt will be able to get in and out of the car with less  difficulty, pain no more than min.    Time  4    Period  Weeks    Target Date  11/28/18      PT SHORT TERM GOAL #4   Title  The patient will improve neck AROM from 40 deg rotation bilaterally to 50 degrees to demo improved head motion needed for vestibular adaptation.    Time  4    Period  Weeks    Status  New      PT SHORT TERM GOAL #5   Title  The patient will tolerate rolling R<>L with dizziness < or equal to 2/10 (improved from 5/10 at eval).    Period  Weeks    Status  Partially Met        PT Long Term Goals - 10/31/18 1445      PT LONG TERM GOAL #1   Title  The patient will be independent with progression of HEP for post d/c    Time  8    Period  Weeks    Status  New    Target Date  12/26/18      PT LONG TERM GOAL #2   Title  Pt will be able to report increased time with family and friends, min limitation due to pain in back    Time  8    Period  Weeks    Status  New    Target Date  12/26/18      PT LONG TERM GOAL #3   Title  FOTO score will improve to no more than 45% limited    Baseline  64%    Time  8    Period  Weeks    Status  New    Target Date  12/26/18      PT LONG TERM GOAL #4   Title  Pt will be able to lift groceries from the floor with no more than min difficulty    Time  8    Period  Weeks    Status  New    Target Date  12/26/18            Plan - 11/21/18 0920    Clinical Impression Statement  Pt tolerating treatment well today. Pt reporting 5/10 pain at beginning of session and no increased pain throughout. Pt reporting 2-3/10 pain follow IFC treatment and ice. Conitnue with skilled PT as pt progreses toward goals set.    Personal Factors and Comorbidities  Age;Behavior Pattern;Fitness;Comorbidity 1;Comorbidity 2    Comorbidities  HTN, sedentary lifestyle, previous back surgery, lives alone, vertigo    Examination-Activity Limitations  Bed Mobility;Bend;Locomotion Level;Stand;Squat;Transfers;Sit;Dressing;Sleep;Carry     Examination-Participation Restrictions  Management consultant;Shop    Stability/Clinical Decision Making  Evolving/Moderate complexity    PT Frequency  2x / week    PT Duration  8 weeks    PT Treatment/Interventions  ADLs/Self Care Home Management;Canalith Repostioning;Therapeutic activities;Therapeutic exercise;Neuromuscular re-education;Patient/family education;Vestibular;Dry needling;Passive range of motion;Manual techniques;Electrical Stimulation;Cryotherapy;Ultrasound;Taping;Moist Heat    PT Next Visit Plan  Nustep, hip, core strength and modalities as needed. Add stab to HEP    PT Home Exercise Plan  PPT, knee to chest, LTR, clam shells with green theraband    Consulted and Agree with Plan of Care  Patient       Patient will benefit from skilled therapeutic intervention in order to improve the following deficits and impairments:  Abnormal gait, Decreased balance, Decreased activity tolerance, Decreased strength, Increased fascial restricitons, Impaired flexibility, Postural dysfunction, Pain, Obesity, Decreased range of motion, Hypomobility, Decreased mobility  Visit Diagnosis: 1. Sacroiliac joint pain   2. Difficulty in walking, not elsewhere classified   3. Stiffness of right hip, not elsewhere classified   4. Dizziness and giddiness   5. Unsteadiness on feet        Problem List Patient Active Problem List   Diagnosis Date Noted  . Cyst of left ovary 07/04/2016  . Hyperparathyroidism ([place]) 08/24/2014  . Slow transit constipation 01/13/2014  . IBS (irritable bowel syndrome), GERD, hx colon polyps, chronic abd pain - followed by Dr. Sharlett Iles in GI 06/11/2012  . Osteopenia 08/29/2011  . Vitamin D deficiency 08/29/2011  . GERD (gastroesophageal reflux disease) 11/23/2010  . HYPERCHOLESTEROLEMIA 09/09/2007  . Essential hypertension 09/09/2007    Oretha Caprice, PT 11/21/2018, 9:35 AM  [facility] [address] [place], [place], [address] Phone: [phone]   Fax:  [phone]  Name: Patrizia Paule MRN: [id number] Date of Birth: 08-15-1933

## 2018-11-25 ENCOUNTER — Other Ambulatory Visit: Payer: Self-pay

## 2018-11-25 ENCOUNTER — Encounter: Payer: Medicare Other | Admitting: Physical Medicine & Rehabilitation

## 2018-11-25 ENCOUNTER — Ambulatory Visit: Payer: Medicare Other | Admitting: Physical Therapy

## 2018-11-25 ENCOUNTER — Encounter: Payer: Self-pay | Admitting: Physical Therapy

## 2018-11-25 DIAGNOSIS — M25651 Stiffness of right hip, not elsewhere classified: Secondary | ICD-10-CM | POA: Diagnosis not present

## 2018-11-25 DIAGNOSIS — M533 Sacrococcygeal disorders, not elsewhere classified: Secondary | ICD-10-CM | POA: Diagnosis not present

## 2018-11-25 DIAGNOSIS — R262 Difficulty in walking, not elsewhere classified: Secondary | ICD-10-CM

## 2018-11-25 DIAGNOSIS — R42 Dizziness and giddiness: Secondary | ICD-10-CM | POA: Diagnosis not present

## 2018-11-25 DIAGNOSIS — R2681 Unsteadiness on feet: Secondary | ICD-10-CM | POA: Diagnosis not present

## 2018-11-25 NOTE — Therapy (Signed)
[place], [place], [address] Phone: [phone]   Fax:  [phone]  Physical Therapy Treatment  Patient Details  Name: Robin [name] MRN: [id number] Date of Birth: Jun 23, 1933 Referring Provider (PT): Dr. Alysia Penna   Encounter Date: 11/25/2018  PT End of Session - 11/25/18 1313    Visit Number  5    Number of Visits  16    Date for PT Re-Evaluation  12/26/18    PT Start Time  1315    PT Stop Time  1410    PT Time Calculation (min)  55 min    Activity Tolerance  Patient tolerated treatment well    Behavior During Therapy  [facility] for tasks assessed/performed       Past Medical History:  Diagnosis Date  . Allergy   . Arthritis   . Asthma   . Colon polyps    tubulovillous adenoma, 2010  . External hemorrhoids without mention of complication   . GERD (gastroesophageal reflux disease)   . Headache    eval with neuro in 2011  . Hyperlipemia   . Hyperparathyroidism [facility])    seeing Dr. Dwyane Dee in endocrinology  . Hypertension   . IBS (irritable bowel syndrome), GERD, hx colon polyps, chronic abd pain - followed by Dr. Sharlett Iles in GI 06/11/2012  . Osteoporosis 08/29/2011  . Vertigo    eval with neuro in 2011, brief recurrence 2016    Past Surgical History:  Procedure Laterality Date  . BACK SURGERY  1974  . CARPAL TUNNEL RELEASE Left 06/2008  . COLONOSCOPY    . EYE SURGERY  07/2017   bilateral cataract extraction  . POLYPECTOMY    . TONSILLECTOMY    . UPPER GASTROINTESTINAL ENDOSCOPY      There were no vitals filed for this visit.  Subjective Assessment - 11/25/18 1324    Subjective  The ice and electrodes helped last time.  I'm better.  Patient walks very slowly to avoid increased pain. "I guess i'm just being cautious"    Currently in Pain?  Yes    Pain Score  4     Pain Location  Back    Pain Orientation  Right;Lower    Pain Descriptors / Indicators  Aching;Tightness    Pain Type   Chronic pain    Pain Onset  More than a month ago    Pain Frequency  Intermittent    Aggravating Factors   Nustep aggravated the L side    Pain Relieving Factors  ice         [facility] Adult PT Treatment/Exercise - 11/25/18 0001      Lumbar Exercises: Stretches   Piriformis Stretch  Right;Left;3 reps    Piriformis Stretch Limitations  knee out then across used UEs to push/pull       Lumbar Exercises: Standing   Heel Raises  10 reps    Functional Squats  10 reps    Functional Squats Limitations  UE assist at chair     Other Standing Lumbar Exercises  hip abduction x 10     Other Standing Lumbar Exercises  high march 1 UE asist       Lumbar Exercises: Seated   Sit to Stand  10 reps    Sit to Stand Limitations  close supervision       Lumbar Exercises: Supine   Clam  10 reps    Clam Limitations  x 10 no band (she forgot at home)  Heel Slides Limitations  knee and hip ext/flex each LE x 10 for stabilization     Bent Knee Raise  10 reps    Bridge  10 reps    Bridge Limitations  no band       Cryotherapy   Number Minutes Cryotherapy  15 Minutes    Cryotherapy Location  Lumbar Spine      Electrical Stimulation   Electrical Stimulation Location  lumbar spine    Electrical Stimulation Action  IFC     Electrical Stimulation Parameters  to tolerance     Electrical Stimulation Goals  Pain             PT Education - 11/25/18 1403    Education Details  upgraded HEP    Person(s) Educated  Patient    Methods  Explanation;Handout;Verbal cues;Demonstration    Comprehension  Verbalized understanding;Returned demonstration       PT Short Term Goals - 11/25/18 1343      PT SHORT TERM GOAL #1   Title  Pt will be I with HEP for posture, gentle strength and flexibility program    Status  Achieved      PT SHORT TERM GOAL #2   Title  Pt will be able to sit with equal weightbearing and min pain for 10 min    Status  Achieved      PT SHORT TERM GOAL #3   Title  Pt will be  able to get in and out of the car with less difficulty, pain no more than min.    Baseline  improving    Status  Partially Met        PT Long Term Goals - 11/25/18 1341      PT LONG TERM GOAL #1   Title  The patient will be independent with progression of HEP for post d/c    Status  On-going      PT LONG TERM GOAL #2   Title  Pt will be able to report increased time with family and friends, min limitation due to pain in back    Status  On-going      PT LONG TERM GOAL #3   Title  FOTO score will improve to no more than 45% limited    Status  Unable to assess      PT LONG TERM GOAL #4   Title  Pt will be able to lift groceries from the floor with no more than min difficulty    Status  On-going            Plan - 11/25/18 1404    Clinical Impression Statement  Patient continues to self limit her activities due to pain but also lacks confidence with more challenging activities.  She can make her bed 1 x per week, cook a bit. She spends time with her family and is not limited by pain (moderate most of the time). Had some difficulty with standing exericses but mostly due to fatigue.    PT Treatment/Interventions  ADLs/Self Care Home Management;Canalith Repostioning;Therapeutic activities;Therapeutic exercise;Neuromuscular re-education;Patient/family education;Vestibular;Dry needling;Passive range of motion;Manual techniques;Electrical Stimulation;Cryotherapy;Ultrasound;Taping;Moist Heat    PT Next Visit Plan  hip, core strength and modalities as needed. Add stab to HEP.  Push more in  standing exercises.    PT Home Exercise Plan  PPT, knee to chest, LTR, clam shells with green theraband, sit to stand, hip abd and high knee march    Consulted and Agree with Plan of Care  Patient       Patient will benefit from skilled therapeutic intervention in order to improve the following deficits and impairments:  Abnormal gait, Decreased balance, Decreased activity tolerance, Decreased  strength, Increased fascial restricitons, Impaired flexibility, Postural dysfunction, Pain, Obesity, Decreased range of motion, Hypomobility, Decreased mobility  Visit Diagnosis: 1. Difficulty in walking, not elsewhere classified   2. Sacroiliac joint pain   3. Stiffness of right hip, not elsewhere classified        Problem List Patient Active Problem List   Diagnosis Date Noted  . Cyst of left ovary 07/04/2016  . Hyperparathyroidism ([place]) 08/24/2014  . Slow transit constipation 01/13/2014  . IBS (irritable bowel syndrome), GERD, hx colon polyps, chronic abd pain - followed by Dr. Sharlett Iles in GI 06/11/2012  . Osteopenia 08/29/2011  . Vitamin D deficiency 08/29/2011  . GERD (gastroesophageal reflux disease) 11/23/2010  . HYPERCHOLESTEROLEMIA 09/09/2007  . Essential hypertension 09/09/2007    Robin [name] 11/25/2018, 2:08 PM  [facility] [address] [place], [place], [address] Phone: [phone]   Fax:  [phone]  Name: Robin [name] MRN: [id number] Date of Birth: [birth year]  Raeford Razor, PT 11/25/18 2:08 PM Phone: [phone] Fax: [phone]

## 2018-11-25 NOTE — Patient Instructions (Signed)
   FUNCTIONAL MOBILITY: Marching - Standing    March in place by lifting left leg up, then right. Alternate. ___ reps per set, ___ sets per day, ___ days per week Hold onto a support.  Copyright  VHI. All rights reserved.  HIP / KNEE: Extension - Sit to Stand    Sitting, lean chest forward, raise hips up from surface. Straighten hips and knees. Weight bear equally on left and right sides. Backs of legs should not push off surface. ___ reps per set, ___ sets per day, ___ days per week Use assistive device as needed.  Copyright  VHI. All rights reserved.     ABDUCTION: Standing (Active)    Stand, feet flat. Lift right leg out to side. Use ___ lbs. Complete ___ sets of ___ repetitions. Perform ___ sessions per day.  http://gtsc.exer.us/111   Copyright  VHI. All rights reserved.

## 2018-11-27 ENCOUNTER — Other Ambulatory Visit: Payer: Self-pay

## 2018-11-27 ENCOUNTER — Ambulatory Visit: Payer: Medicare Other | Admitting: Physical Therapy

## 2018-11-27 DIAGNOSIS — R2681 Unsteadiness on feet: Secondary | ICD-10-CM | POA: Diagnosis not present

## 2018-11-27 DIAGNOSIS — M533 Sacrococcygeal disorders, not elsewhere classified: Secondary | ICD-10-CM | POA: Diagnosis not present

## 2018-11-27 DIAGNOSIS — R262 Difficulty in walking, not elsewhere classified: Secondary | ICD-10-CM | POA: Diagnosis not present

## 2018-11-27 DIAGNOSIS — M25651 Stiffness of right hip, not elsewhere classified: Secondary | ICD-10-CM

## 2018-11-27 DIAGNOSIS — R42 Dizziness and giddiness: Secondary | ICD-10-CM | POA: Diagnosis not present

## 2018-11-27 NOTE — Patient Instructions (Signed)
Calf Stretch    Stand with hands supported on wall, elbows slightly bent, front knee bent, back knee straight, feet parallel and both heels on floor. Lean into wall by pushing hips forward until a stretch is felt in calf muscle. Make sure your toes are pointing straight ahead!  Hold ___30 _ seconds. Repeat with leg positions switched. DO x 3 each side, x 2 per day   Copyright  VHI. All rights reserved.

## 2018-11-27 NOTE — Therapy (Signed)
[place], [place], [address] Phone: [phone]   Fax:  [phone]  Physical Therapy Treatment  Patient Details  Name: Robin [name] MRN: [id number] Date of Birth: 1934-02-09 Referring Provider (PT): Dr. Alysia Penna   Encounter Date: 11/27/2018  PT End of Session - 11/27/18 0915    Visit Number  6    Number of Visits  16    Date for PT Re-Evaluation  12/26/18    PT Start Time  0900    PT Stop Time  1000    PT Time Calculation (min)  60 min    Activity Tolerance  Patient tolerated treatment well    Behavior During Therapy  [facility] for tasks assessed/performed       Past Medical History:  Diagnosis Date  . Allergy   . Arthritis   . Asthma   . Colon polyps    tubulovillous adenoma, 2010  . External hemorrhoids without mention of complication   . GERD (gastroesophageal reflux disease)   . Headache    eval with neuro in 2011  . Hyperlipemia   . Hyperparathyroidism [facility])    seeing Dr. Dwyane Dee in endocrinology  . Hypertension   . IBS (irritable bowel syndrome), GERD, hx colon polyps, chronic abd pain - followed by Dr. Sharlett Iles in GI 06/11/2012  . Osteoporosis 08/29/2011  . Vertigo    eval with neuro in 2011, brief recurrence 2016    Past Surgical History:  Procedure Laterality Date  . BACK SURGERY  1974  . CARPAL TUNNEL RELEASE Left 06/2008  . COLONOSCOPY    . EYE SURGERY  07/2017   bilateral cataract extraction  . POLYPECTOMY    . TONSILLECTOMY    . UPPER GASTROINTESTINAL ENDOSCOPY      There were no vitals filed for this visit.  Subjective Assessment - 11/27/18 0914    Subjective  Had increased pain after last session but it didnt last . Into session she said she has LLE (calf) tightness, pain.  Mostly when she wakes up.  Doesnt go away.    Currently in Pain?  Yes    Pain Score  4     Pain Location  Back    Pain Orientation  Right    Pain Descriptors / Indicators  Aching;Sore    Pain Type  Chronic pain    Pain Onset  More than a month ago    Pain Frequency  Intermittent         [facility] Adult PT Treatment/Exercise - 11/27/18 0001      Lumbar Exercises: Stretches   Active Hamstring Stretch  Left;10 seconds    Active Hamstring Stretch Limitations  x 10 towel under knee with ankle DF     Single Knee to Chest Stretch  3 reps;20 seconds    Lower Trunk Rotation  10 seconds    Lower Trunk Rotation Limitations  x 10     Piriformis Stretch  Right;Left;3 reps    Gastroc Stretch  3 reps;30 seconds    Gastroc Stretch Limitations  slant board       Lumbar Exercises: Aerobic   UBE (Upper Arm Bike)  5 min L1       Lumbar Exercises: Seated   Long Arc Quad on [place]  Strengthening;Both;1 set;10 reps    LAQ on [employer] (lbs)  0    LAQ on [place] Limitations  needs UE assist     Other Seated Lumbar Exercises  seated core work on  fit disc: UE flexion/ext, march , torso rotation , A/P pelvic tilts       Cryotherapy   Number Minutes Cryotherapy  15 Minutes    Cryotherapy Location  Lumbar Spine    Type of Cryotherapy  Ice pack      Electrical Stimulation   Electrical Stimulation Location  lumbar spine    Electrical Stimulation Action  IFC    Electrical Stimulation Parameters  18    Electrical Stimulation Goals  Pain             PT Education - 11/27/18 0931    Education Details  calf pain, blood clot sx    Person(s) Educated  Patient    Methods  Explanation    Comprehension  Verbalized understanding;Returned demonstration       PT Short Term Goals - 11/25/18 1343      PT SHORT TERM GOAL #1   Title  Pt will be I with HEP for posture, gentle strength and flexibility program    Status  Achieved      PT SHORT TERM GOAL #2   Title  Pt will be able to sit with equal weightbearing and min pain for 10 min    Status  Achieved      PT SHORT TERM GOAL #3   Title  Pt will be able to get in and out of the car with less difficulty, pain no more than min.    Baseline   improving    Status  Partially Met        PT Long Term Goals - 11/25/18 1341      PT LONG TERM GOAL #1   Title  The patient will be independent with progression of HEP for post d/c    Status  On-going      PT LONG TERM GOAL #2   Title  Pt will be able to report increased time with family and friends, min limitation due to pain in back    Status  On-going      PT LONG TERM GOAL #3   Title  FOTO score will improve to no more than 45% limited    Status  Unable to assess      PT LONG TERM GOAL #4   Title  Pt will be able to lift groceries from the floor with no more than min difficulty    Status  On-going            Plan - 11/27/18 0957    Clinical Impression Statement  Added in standing calf stretches to see if that affects her LLE tightness.  Worked on seated core and general stretching.  Pain is staying moderate most of the time but very slow progress.    Personal Factors and Comorbidities  Age;Behavior Pattern;Fitness;Comorbidity 1;Comorbidity 2    Comorbidities  HTN, sedentary lifestyle, previous back surgery, lives alone, vertigo    Examination-Activity Limitations  Bed Mobility;Bend;Locomotion Level;Stand;Squat;Transfers;Sit;Dressing;Sleep;Carry    Examination-Participation Restrictions  Management consultant;Shop    PT Treatment/Interventions  ADLs/Self Care Home Management;Canalith Repostioning;Therapeutic activities;Therapeutic exercise;Neuromuscular re-education;Patient/family education;Vestibular;Dry needling;Passive range of motion;Manual techniques;Electrical Stimulation;Cryotherapy;Ultrasound;Taping;Moist Heat    PT Next Visit Plan  hip, core strength and modalities as needed. Add stab to HEP.  Work in standing as able    PT Home Exercise Plan  PPT, knee to chest, LTR, clam shells with green theraband, sit to stand, hip abd and high knee march, calf stretch    Consulted and Agree with Plan of Care  Patient       Patient will benefit from skilled  therapeutic intervention in order to improve the following deficits and impairments:  Abnormal gait, Decreased balance, Decreased activity tolerance, Decreased strength, Increased fascial restricitons, Impaired flexibility, Postural dysfunction, Pain, Obesity, Decreased range of motion, Hypomobility, Decreased mobility  Visit Diagnosis: 1. Difficulty in walking, not elsewhere classified   2. Sacroiliac joint pain   3. Stiffness of right hip, not elsewhere classified        Problem List Patient Active Problem List   Diagnosis Date Noted  . Cyst of left ovary 07/04/2016  . Hyperparathyroidism ([place]) 08/24/2014  . Slow transit constipation 01/13/2014  . IBS (irritable bowel syndrome), GERD, hx colon polyps, chronic abd pain - followed by Dr. Sharlett Iles in GI 06/11/2012  . Osteopenia 08/29/2011  . Vitamin D deficiency 08/29/2011  . GERD (gastroesophageal reflux disease) 11/23/2010  . HYPERCHOLESTEROLEMIA 09/09/2007  . Essential hypertension 09/09/2007    Micky Overturf 11/27/2018, 10:03 AM  [facility] [address] [place], [place], [address] Phone: [phone]   Fax:  [phone]  Name: Debie Ashline MRN: [id number] Date of Birth: [birth year]  Raeford Razor, PT 11/27/18 10:03 AM Phone: [phone] Fax: [phone]

## 2018-12-02 ENCOUNTER — Other Ambulatory Visit: Payer: Self-pay

## 2018-12-02 ENCOUNTER — Ambulatory Visit: Payer: Medicare Other | Admitting: Physical Therapy

## 2018-12-02 ENCOUNTER — Encounter: Payer: Self-pay | Admitting: Physical Therapy

## 2018-12-02 DIAGNOSIS — M25651 Stiffness of right hip, not elsewhere classified: Secondary | ICD-10-CM | POA: Diagnosis not present

## 2018-12-02 DIAGNOSIS — M533 Sacrococcygeal disorders, not elsewhere classified: Secondary | ICD-10-CM | POA: Diagnosis not present

## 2018-12-02 DIAGNOSIS — R42 Dizziness and giddiness: Secondary | ICD-10-CM | POA: Diagnosis not present

## 2018-12-02 DIAGNOSIS — R2681 Unsteadiness on feet: Secondary | ICD-10-CM | POA: Diagnosis not present

## 2018-12-02 DIAGNOSIS — R262 Difficulty in walking, not elsewhere classified: Secondary | ICD-10-CM | POA: Diagnosis not present

## 2018-12-02 NOTE — Patient Instructions (Signed)
Prepared By: [place], [place]  Phone: [phone]  Step 1  Step 2  Supine Piriformis Stretch with Foot on Ground reps: 3  sets: 1  hold: 30  daily: 1  weekly: 7 Setup  Begin by lying on your back with both knees bent and feet resting flat on the ground. Cross one leg over the other so your foot is resting on your knee.  Movement  Grab your leg just below the knee and slowly draw it towards your opposite shoulder until you feel a stretch in your buttocks.  Tip  Do not allow your back to twist or bend excessively during the stretch.

## 2018-12-02 NOTE — Therapy (Signed)
[place], [place], [address] Phone: [phone]   Fax:  [phone]  Physical Therapy Treatment  Patient Details  Name: Robin [name] MRN: [id number] Date of Birth: 13-Jan-1934 Referring Provider (PT): Dr. Alysia Penna   Encounter Date: 12/02/2018  PT End of Session - 12/02/18 0906    Visit Number  7    Number of Visits  16    Date for PT Re-Evaluation  12/26/18    PT Start Time  0900    PT Stop Time  0953    PT Time Calculation (min)  53 min    Activity Tolerance  Patient tolerated treatment well    Behavior During Therapy  [facility] for tasks assessed/performed       Past Medical History:  Diagnosis Date  . Allergy   . Arthritis   . Asthma   . Colon polyps    tubulovillous adenoma, 2010  . External hemorrhoids without mention of complication   . GERD (gastroesophageal reflux disease)   . Headache    eval with neuro in 2011  . Hyperlipemia   . Hyperparathyroidism [facility])    seeing Dr. Dwyane Dee in endocrinology  . Hypertension   . IBS (irritable bowel syndrome), GERD, hx colon polyps, chronic abd pain - followed by Dr. Sharlett Iles in GI 06/11/2012  . Osteoporosis 08/29/2011  . Vertigo    eval with neuro in 2011, brief recurrence 2016    Past Surgical History:  Procedure Laterality Date  . BACK SURGERY  1974  . CARPAL TUNNEL RELEASE Left 06/2008  . COLONOSCOPY    . EYE SURGERY  07/2017   bilateral cataract extraction  . POLYPECTOMY    . TONSILLECTOMY    . UPPER GASTROINTESTINAL ENDOSCOPY      There were no vitals filed for this visit.  Subjective Assessment - 12/02/18 0902    Subjective  Calf only hurts a little today.  Back is OK.  Wants to try and get an ultrasound or something for my L calf.  I dont think it has anything to do with my back. Walks slowly because she is trying to be careful. I dont feel as tense or tight in my back like I did when i first started coming.    Limitations   Sitting;Lifting;Standing;Walking;House hold activities;Other (comment)    How long can you stand comfortably?  I think I could do 10-15 min    How long can you walk comfortably?  daugher gets all her groceries    Currently in Pain?  Yes    Pain Score  3     Pain Location  Back    Pain Orientation  Right    Pain Descriptors / Indicators  Aching;Sore    Pain Type  Chronic pain    Pain Radiating Towards  L calf    Pain Onset  More than a month ago    Pain Frequency  Constant    Aggravating Factors   if i do anything crazy, bending over too much    Pain Relieving Factors  ice, rest    Multiple Pain Sites  Yes    Pain Score  5    Pain Location  Leg    Pain Orientation  Left;Lower    Pain Descriptors / Indicators  Tightness    Pain Type  Chronic pain    Pain Onset  More than a month ago    Pain Frequency  Constant    Aggravating Factors  i dont really worse    Pain Relieving Factors  nothing , stretching helps some           [facility] Adult PT Treatment/Exercise - 12/02/18 0001      Lumbar Exercises: Stretches   Passive Hamstring Stretch  3 reps;30 seconds    Single Knee to Chest Stretch  3 reps;20 seconds    Piriformis Stretch  Right;Left;3 reps;30 seconds    Gastroc Stretch  3 reps;30 seconds    Gastroc Stretch Limitations  slant board       Lumbar Exercises: Standing   Heel Raises  15 reps    Other Standing Lumbar Exercises  hip abduction x 10    3 sets    Other Standing Lumbar Exercises  high march 1 UE asist    x 15      Lumbar Exercises: Seated   Sit to Stand  10 reps    Sit to Stand Limitations  close supervision       Lumbar Exercises: Sidelying   Clam  Both;20 reps      Modalities   Modalities  Moist Heat      Moist Heat Therapy   Number Minutes Moist Heat  15 Minutes    Moist Heat Location  Lumbar Spine      Electrical Stimulation   Electrical Stimulation Location  lumbar spine    Electrical Stimulation Action  IFC     Electrical Stimulation Parameters   14    Electrical Stimulation Goals  Pain             PT Education - 12/02/18 [phone]    Education Details  recommend calling MD for LE swelling and calf pain , HEP    Person(s) Educated  Patient    Methods  Explanation    Comprehension  Verbalized understanding       PT Short Term Goals - 11/25/18 1343      PT SHORT TERM GOAL #1   Title  Pt will be I with HEP for posture, gentle strength and flexibility program    Status  Achieved      PT SHORT TERM GOAL #2   Title  Pt will be able to sit with equal weightbearing and min pain for 10 min    Status  Achieved      PT SHORT TERM GOAL #3   Title  Pt will be able to get in and out of the car with less difficulty, pain no more than min.    Baseline  improving    Status  Partially Met        PT Long Term Goals - 11/25/18 1341      PT LONG TERM GOAL #1   Title  The patient will be independent with progression of HEP for post d/c    Status  On-going      PT LONG TERM GOAL #2   Title  Pt will be able to report increased time with family and friends, min limitation due to pain in back    Status  On-going      PT LONG TERM GOAL #3   Title  FOTO score will improve to no more than 45% limited    Status  Unable to assess      PT LONG TERM GOAL #4   Title  Pt will be able to lift groceries from the floor with no more than min difficulty    Status  On-going  Plan - 12/02/18 0929    Clinical Impression Statement  Patient relays less pain overall but continues to have constant pain in Rt side back and also in her L LE.  She fatigues quickly.  Progessing slowly toward goals.  She will call her primary MD today.    PT Treatment/Interventions  ADLs/Self Care Home Management;Canalith Repostioning;Therapeutic activities;Therapeutic exercise;Neuromuscular re-education;Patient/family education;Vestibular;Dry needling;Passive range of motion;Manual techniques;Electrical Stimulation;Cryotherapy;Ultrasound;Taping;Moist Heat     PT Next Visit Plan  hip, core strength and modalities as needed. Add stab to HEP.  Work in standing as able    PT Home Exercise Plan  PPT, knee to chest, LTR, clam shells with green theraband, sit to stand, hip abd and high knee march, calf stretch, piriformis (knee to opp shoulder)       Patient will benefit from skilled therapeutic intervention in order to improve the following deficits and impairments:  Abnormal gait, Decreased balance, Decreased activity tolerance, Decreased strength, Increased fascial restricitons, Impaired flexibility, Postural dysfunction, Pain, Obesity, Decreased range of motion, Hypomobility, Decreased mobility  Visit Diagnosis: 1. Difficulty in walking, not elsewhere classified   2. Sacroiliac joint pain   3. Stiffness of right hip, not elsewhere classified        Problem List Patient Active Problem List   Diagnosis Date Noted  . Cyst of left ovary 07/04/2016  . Hyperparathyroidism ([place]) 08/24/2014  . Slow transit constipation 01/13/2014  . IBS (irritable bowel syndrome), GERD, hx colon polyps, chronic abd pain - followed by Dr. Sharlett Iles in GI 06/11/2012  . Osteopenia 08/29/2011  . Vitamin D deficiency 08/29/2011  . GERD (gastroesophageal reflux disease) 11/23/2010  . HYPERCHOLESTEROLEMIA 09/09/2007  . Essential hypertension 09/09/2007    PAA,JENNIFER 12/02/2018, 9:49 AM  [facility] [address] [place], [place], [address] Phone: [phone]   Fax:  [phone]  Name: Robin [name] MRN: [id number] Date of Birth: 1934/04/03  Raeford Razor, PT 12/02/18 9:49 AM Phone: [phone] Fax: [phone]

## 2018-12-04 ENCOUNTER — Ambulatory Visit: Payer: Medicare Other | Admitting: Physical Therapy

## 2018-12-04 ENCOUNTER — Other Ambulatory Visit: Payer: Self-pay

## 2018-12-04 DIAGNOSIS — M25651 Stiffness of right hip, not elsewhere classified: Secondary | ICD-10-CM

## 2018-12-04 DIAGNOSIS — R42 Dizziness and giddiness: Secondary | ICD-10-CM | POA: Diagnosis not present

## 2018-12-04 DIAGNOSIS — M533 Sacrococcygeal disorders, not elsewhere classified: Secondary | ICD-10-CM | POA: Diagnosis not present

## 2018-12-04 DIAGNOSIS — R2681 Unsteadiness on feet: Secondary | ICD-10-CM | POA: Diagnosis not present

## 2018-12-04 DIAGNOSIS — R262 Difficulty in walking, not elsewhere classified: Secondary | ICD-10-CM

## 2018-12-04 NOTE — Therapy (Signed)
[place], [place], [address] Phone: [phone]   Fax:  [phone]  Physical Therapy Treatment  Patient Details  Name: Robin [name] MRN: [id number] Date of Birth: 1934-04-22 Referring Provider (PT): Dr. Alysia Penna   Encounter Date: 12/04/2018  PT End of Session - 12/04/18 0906    Visit Number  8    Number of Visits  16    Date for PT Re-Evaluation  12/26/18    PT Start Time  0905    PT Stop Time  0956    PT Time Calculation (min)  51 min    Activity Tolerance  Patient tolerated treatment well    Behavior During Therapy  [facility] for tasks assessed/performed       Past Medical History:  Diagnosis Date  . Allergy   . Arthritis   . Asthma   . Colon polyps    tubulovillous adenoma, 2010  . External hemorrhoids without mention of complication   . GERD (gastroesophageal reflux disease)   . Headache    eval with neuro in 2011  . Hyperlipemia   . Hyperparathyroidism [facility])    seeing Dr. Dwyane Dee in endocrinology  . Hypertension   . IBS (irritable bowel syndrome), GERD, hx colon polyps, chronic abd pain - followed by Dr. Sharlett Iles in GI 06/11/2012  . Osteoporosis 08/29/2011  . Vertigo    eval with neuro in 2011, brief recurrence 2016    Past Surgical History:  Procedure Laterality Date  . BACK SURGERY  1974  . CARPAL TUNNEL RELEASE Left 06/2008  . COLONOSCOPY    . EYE SURGERY  07/2017   bilateral cataract extraction  . POLYPECTOMY    . TONSILLECTOMY    . UPPER GASTROINTESTINAL ENDOSCOPY      There were no vitals filed for this visit.  Subjective Assessment - 12/04/18 0913    Subjective  Has not called MD yet.  Dr. Loanne Drilling was the one who doubled my dose.    Currently in Pain?  Yes    Pain Score  4     Pain Location  Back    Pain Orientation  Right    Pain Descriptors / Indicators  Aching    Pain Type  Chronic pain    Pain Onset  More than a month ago    Pain Frequency  Constant          [facility] PT Assessment - 12/04/18 0001      Observation/Other Assessments   Focus on Therapeutic Outcomes (FOTO)   49%        [facility] Adult PT Treatment/Exercise - 12/04/18 0001      Lumbar Exercises: Stretches   Single Knee to Chest Stretch  3 reps;30 seconds      Lumbar Exercises: Aerobic   Tread Mill  0.8 mph for 5 min       Lumbar Exercises: Supine   Pelvic Tilt  10 reps    Heel Slides  10 reps    Heel Slides Limitations  with ball     Bent Knee Raise  10 reps    Bridge  10 reps    Bridge Limitations  on ball     Straight Leg Raise  10 reps    Straight Leg Raises Limitations  from ball       Lumbar Exercises: Sidelying   Clam  Both;20 reps    Hip Abduction  Left;10 reps    Hip Abduction Weights (lbs)  2 sets  Acupuncturist Location  lumbar spine    Electrical Stimulation Action  IFC x 15     Electrical Stimulation Goals  Pain               PT Short Term Goals - 12/04/18 0943      PT SHORT TERM GOAL #1   Title  Pt will be I with HEP for posture, gentle strength and flexibility program    Status  Achieved      PT SHORT TERM GOAL #2   Title  Pt will be able to sit with equal weightbearing and min pain for 10 min    Status  Achieved      PT SHORT TERM GOAL #3   Title  Pt will be able to get in and out of the car with less difficulty, pain no more than min.    Status  Achieved        PT Long Term Goals - 12/04/18 0944      PT LONG TERM GOAL #1   Title  The patient will be independent with progression of HEP for post d/c    Status  Achieved      PT LONG TERM GOAL #2   Title  Pt will be able to report increased time with family and friends, min limitation due to pain in back    Status  Achieved      PT LONG TERM GOAL #3   Title  FOTO score will improve to no more than 45% limited            Plan - 12/04/18 0949    Clinical Impression Statement  Patient has improved a great deal. She continues to  have pain in her Rt SIJ but can self limit her activity and knows her HEP well.  She is pleased with her functional level and would like to be discharged today. Plans to call Dr. Loanne Drilling regarding LE selling and pain.    PT Treatment/Interventions  ADLs/Self Care Home Management;Canalith Repostioning;Therapeutic activities;Therapeutic exercise;Neuromuscular re-education;Patient/family education;Vestibular;Dry needling;Passive range of motion;Manual techniques;Electrical Stimulation;Cryotherapy;Ultrasound;Taping;Moist Heat    PT Next Visit Plan  DC    PT Home Exercise Plan  PPT, knee to chest, LTR, clam shells with green theraband, sit to stand, hip abd and high knee march, calf stretch, piriformis (knee to opp shoulder)    Consulted and Agree with Plan of Care  Patient       Patient will benefit from skilled therapeutic intervention in order to improve the following deficits and impairments:  Abnormal gait, Decreased balance, Decreased activity tolerance, Decreased strength, Increased fascial restricitons, Impaired flexibility, Postural dysfunction, Pain, Obesity, Decreased range of motion, Hypomobility, Decreased mobility  Visit Diagnosis: 1. Difficulty in walking, not elsewhere classified   2. Sacroiliac joint pain   3. Stiffness of right hip, not elsewhere classified        Problem List Patient Active Problem List   Diagnosis Date Noted  . Cyst of left ovary 07/04/2016  . Hyperparathyroidism ([place]) 08/24/2014  . Slow transit constipation 01/13/2014  . IBS (irritable bowel syndrome), GERD, hx colon polyps, chronic abd pain - followed by Dr. Sharlett Iles in GI 06/11/2012  . Osteopenia 08/29/2011  . Vitamin D deficiency 08/29/2011  . GERD (gastroesophageal reflux disease) 11/23/2010  . HYPERCHOLESTEROLEMIA 09/09/2007  . Essential hypertension 09/09/2007    PAA,JENNIFER 12/04/2018, 12:31 PM  [facility] [address] [place],  [place], [address] Phone: [phone]  Fax:  [phone]  Name: Najah Liverman MRN: [id number] Date of Birth: 11-Oct-1933  Raeford Razor, PT 12/04/18 12:34 PM Phone: [phone] Fax: [phone]

## 2018-12-05 ENCOUNTER — Telehealth: Payer: Self-pay | Admitting: Endocrinology

## 2018-12-05 NOTE — Telephone Encounter (Signed)
Please move up next appt to next available.   

## 2018-12-05 NOTE — Telephone Encounter (Signed)
Per Dr. Cordelia Pen request, please call pt to move her appt to sooner availability to further discuss her concerns.

## 2018-12-05 NOTE — Telephone Encounter (Signed)
Called patient to schedule her an appointment sooner as recommended by Dr Loanne Drilling patient did not answer and I was not able to leave a message.  If patient calls back just schedule her for an appointment with Dr Loanne Drilling at patient and doctors earliest convenience

## 2018-12-05 NOTE — Telephone Encounter (Signed)
Patient called to advise that even after her medicine was changed at her last visit she is still experiencing the same amount of swelling  Would like to know how to proceed  Would like a call back at [phone]

## 2018-12-05 NOTE — Telephone Encounter (Signed)
Please advise 

## 2018-12-06 ENCOUNTER — Ambulatory Visit (INDEPENDENT_AMBULATORY_CARE_PROVIDER_SITE_OTHER): Payer: Medicare Other | Admitting: Endocrinology

## 2018-12-06 ENCOUNTER — Encounter: Payer: Self-pay | Admitting: Endocrinology

## 2018-12-06 ENCOUNTER — Other Ambulatory Visit: Payer: Self-pay

## 2018-12-06 VITALS — BP 158/72 | HR 94 | Ht 62.0 in | Wt 163.8 lb

## 2018-12-06 DIAGNOSIS — I10 Essential (primary) hypertension: Secondary | ICD-10-CM

## 2018-12-06 DIAGNOSIS — R609 Edema, unspecified: Secondary | ICD-10-CM | POA: Diagnosis not present

## 2018-12-06 LAB — BASIC METABOLIC PANEL
BUN: 17 mg/dL (ref 6–23)
CO2: 29 mEq/L (ref 19–32)
Calcium: 10.8 mg/dL — ABNORMAL HIGH (ref 8.4–10.5)
Chloride: 104 mEq/L (ref 96–112)
Creatinine, Ser: 0.87 mg/dL (ref 0.40–1.20)
GFR: 62.19 mL/min (ref >60.00)
Glucose, Bld: 86 mg/dL (ref 70–99)
Potassium: 3.9 mEq/L (ref 3.5–5.1)
Sodium: 140 mEq/L (ref 135–145)

## 2018-12-06 MED ORDER — FUROSEMIDE 20 MG PO TABS: 60.0000 mg | ORAL_TABLET | Freq: Every day | ORAL | 5 refills | Status: DC

## 2018-12-06 NOTE — Telephone Encounter (Signed)
Pt scheduled to be seen today (12/06/18) by Dr. Loanne Drilling

## 2018-12-06 NOTE — Progress Notes (Signed)
Subjective:    Patient ID: Robin [name], female    DOB: April 24, 1934, 85 y.o.   MRN: [id number]  HPI  The state of at least three ongoing medical problems is addressed today, with interval history of each noted here: Pt returns for f/u of hyperparathyroidism (probably combined primary and secondary; first noted in 2009; she has never had urolithiasis, PUD, pancreatitis, depression, or bony fracture; DEXA in 2016 showed osteopenia, and she declines f/u of this; she has mild hypercalcemia).  Denies cramps. This is a stable problem.   Vitamin-D deficiency: she denies numbness.  She takes vitamin-D, 2000 units per day.  HTN: she takes meds as rx'ed.  Pt says diuretic effect has not changed with the increase of lasix.   leg edema is unchanged.  This is a stable problem.   Past Medical History:  Diagnosis Date  . Allergy   . Arthritis   . Asthma   . Colon polyps    tubulovillous adenoma, 2010  . External hemorrhoids without mention of complication   . GERD (gastroesophageal reflux disease)   . Headache    eval with neuro in 2011  . Hyperlipemia   . Hyperparathyroidism [facility])    seeing Dr. Dwyane Dee in endocrinology  . Hypertension   . IBS (irritable bowel syndrome), GERD, hx colon polyps, chronic abd pain - followed by Dr. Sharlett Iles in GI 06/11/2012  . Osteoporosis 08/29/2011  . Vertigo    eval with neuro in 2011, brief recurrence 2016    Past Surgical History:  Procedure Laterality Date  . BACK SURGERY  1974  . CARPAL TUNNEL RELEASE Left 06/2008  . COLONOSCOPY    . EYE SURGERY  07/2017   bilateral cataract extraction  . POLYPECTOMY    . TONSILLECTOMY    . UPPER GASTROINTESTINAL ENDOSCOPY      Social History   Socioeconomic History  . Marital status: Widowed    Spouse name: Not on file  . Number of children: 2  . Years of education: Not on file  . Highest education level: Not on file  Occupational History  . Occupation: Tax adviser    Comment: retired    Fish farm manager:  RETIRED  Social Needs  . Financial resource strain: Not hard at all  . Food insecurity    Worry: Never true    Inability: Never true  . Transportation needs    Medical: No    Non-medical: No  Tobacco Use  . Smoking status: Never Smoker  . Smokeless tobacco: Never Used  Substance and Sexual Activity  . Alcohol use: No    Alcohol/week: 0.0 standard drinks  . Drug use: No  . Sexual activity: Not Currently  Lifestyle  . Physical activity    Days per week: 0 days    Minutes per session: 0 min  . Stress: Not at all  Relationships  . Social Herbalist on phone: Not on file    Gets together: Not on file    Attends religious service: Not on file    Active member of club or organization: Not on file    Attends meetings of clubs or organizations: Not on file    Relationship status: Widowed  . Intimate partner violence    Fear of current or ex partner: Not on file    Emotionally abused: Not on file    Physically abused: Not on file    Forced sexual activity: Not on file  Other Topics Concern  . Not on file  Social History Narrative   Lives alone in a one story home.  Has 2 children, one son and one daughter with 2 grandkids and 1 great-grandchild.    Retired from working for a bank.     Education: high school.     Current Outpatient Medications on File Prior to Visit  Medication Sig Dispense Refill  . albuterol (VENTOLIN HFA) 108 (90 Base) MCG/ACT inhaler Inhale 2 puffs into the lungs every 6 (six) hours as needed. 1 Inhaler 0  . AMBULATORY NON FORMULARY MEDICATION Medication Name: GI cocktail 5-10 ML every 4-6 hours as needed for pain 450 mL 1  . amLODipine (NORVASC) 5 MG tablet Take 1 tablet (5 mg total) by mouth daily. 90 tablet 3  . cetirizine (ZYRTEC) 10 MG tablet Take 10 mg by mouth daily.    . Cholecalciferol 25 MCG (1000 UT) TBDP Take 1,000 Units by mouth daily. 90 tablet 3  . esomeprazole (NEXIUM) 40 MG capsule Take 1 capsule (40 mg total) by mouth 2 (two)  times daily before a meal. (Patient taking differently: Take 40 mg by mouth as needed. ) 180 capsule 3  . ezetimibe (ZETIA) 10 MG tablet TAKE 1 BY MOUTH DAILY 90 tablet 3  . furosemide (LASIX) 40 MG tablet Take 1 tablet (40 mg total) by mouth daily. 90 tablet 3  . rosuvastatin (CRESTOR) 5 MG tablet Take 1/2 tablet by mouth daily. 45 tablet 3   No current facility-administered medications on file prior to visit.     Allergies  Allergen Reactions  . Prilosec [Omeprazole] Cough  . Amoxicillin     REACTION: rash  . Metronidazole     REACTION: rash  . Protonix [Pantoprazole Sodium] Swelling    Family History  Problem Relation Age of Onset  . Diabetes Brother   . Hypertension Brother   . Stroke Brother 58  . Hypertension Father   . Stroke Father   . Diabetes Sister   . Hypertension Sister   . Heart disease Sister        heart failure  . CAD Mother        Died of MI at age 108  . CAD Brother        Died of MI at age 5  . Stroke Sister 110  . Stroke Brother 7  . Colon cancer Neg Hx   . Hypercalcemia Neg Hx   . Colon polyps Neg Hx   . Esophageal cancer Neg Hx   . Stomach cancer Neg Hx   . Rectal cancer Neg Hx     BP (!) 158/72 (BP Location: Left Arm, Patient Position: Sitting, Cuff Size: Normal)   Pulse 94   Ht 5\' 2"  (1.575 m)   Wt 163 lb 12.8 oz (74.3 kg)   SpO2 95%   BMI 29.96 kg/m    Review of Systems Denies sob and chest pain    Objective:   Physical Exam VITAL SIGNS:  See vs page GENERAL: no distress Ext: 1+ bilat leg edema    Lab Results  Component Value Date   CREATININE 0.77 10/21/2018   BUN 16 10/21/2018   NA 141 10/21/2018   K 4.0 10/21/2018   CL 103 10/21/2018   CO2 27 10/21/2018   Lab Results  Component Value Date   PTH 34 10/21/2018   CALCIUM 10.8 (H) 12/06/2018       Assessment & Plan:  Edema: persistent.  HTN: she needs increased rx.  Hypercalcemia: increased lasix might help.  Patient Instructions  Blood tests are requested  for you today.  We'll let you know about the results.  Based on the results, we'll increase the furosemide if we can. Please come back for a follow-up appointment in 2-3 months.

## 2018-12-06 NOTE — Patient Instructions (Signed)
Blood tests are requested for you today.  We'll let you know about the results.  Based on the results, we'll increase the furosemide if we can. Please come back for a follow-up appointment in 2-3 months.

## 2018-12-12 ENCOUNTER — Ambulatory Visit: Payer: Self-pay | Admitting: *Deleted

## 2018-12-12 NOTE — Telephone Encounter (Signed)
Call to office for appointment to evaluate patient edema and pain.  Reason for Disposition . [1] Thigh, calf, or ankle swelling AND [2] bilateral AND [3] 1 side is more swollen  Answer Assessment - Initial Assessment Questions 1. ONSET: "When did the swelling start?" (e.g., minutes, hours, days)     Worse than normal- lasix has been increased - several weeks 2. LOCATION: "What part of the leg is swollen?"  "Are both legs swollen or just one leg?"     Feet/legs- half the calf of leg, bilateral swelling- left leg usually worse 3. SEVERITY: "How bad is the swelling?" (e.g., localized; mild, moderate, severe)  - Localized - small area of swelling localized to one leg  - MILD pedal edema - swelling limited to foot and ankle, pitting edema < 1/4 inch (6 mm) deep, rest and elevation eliminate most or all swelling  - MODERATE edema - swelling of lower leg to knee, pitting edema > 1/4 inch (6 mm) deep, rest and elevation only partially reduce swelling  - SEVERE edema - swelling extends above knee, facial or hand swelling present      Moderate- mostly in feet 4. REDNESS: "Does the swelling look red or infected?"     Some red areas- no infection 5. PAIN: "Is the swelling painful to touch?" If so, ask: "How painful is it?"   (Scale 1-10; mild, moderate or severe)     Yes- at night- pain in left leg-calf, 7- leg is painful all the time- has gotten worse 6. FEVER: "Do you have a fever?" If so, ask: "What is it, how was it measured, and when did it start?"      no 7. CAUSE: "What do you think is causing the leg swelling?"     fluid retention 8. MEDICAL HISTORY: "Do you have a history of heart failure, kidney disease, liver failure, or cancer?"     no 9. RECURRENT SYMPTOM: "Have you had leg swelling before?" If so, ask: "When was the last time?" "What happened that time?"     Yes- not this bad- patient takes Lasix 10. OTHER SYMPTOMS: "Do you have any other symptoms?" (e.g., chest pain, difficulty  breathing)       Monday night she woke up with trouble breathing (SOB) lasted 30-45 minutes- no other trouble since, sweating also 11. PREGNANCY: "Is there any chance you are pregnant?" "When was your last menstrual period?"       n/a  Protocols used: LEG SWELLING AND EDEMA-A-AH

## 2018-12-12 NOTE — Telephone Encounter (Signed)
Pt has been scheduled.  °

## 2018-12-12 NOTE — Progress Notes (Signed)
Chief Complaint  Patient presents with   Leg Swelling    in both legs with pain. Pain started several weeks ago but just started in right leg last night. Pt states that swelling started several months ago also     HPI: Robin [name] 85 y.o. come in for acute visit  PCP NA   Sent in by nurse triage from yeasterday  For leg swelling  ? And now calf pain   Leg edema is not new  But some worse    But  More concenred about left calf pain as new and inc night  interruptinng sleep  No trauma  Or joint injury falling or fever  Had an  Tim eof sob but no cough fever .  Has fu with cards soon.  No hx of clotting   Has some VV   Today now has calf pain on right also  but not as bad   has HYPERCHOLESTEROLEMIA; Essential hypertension; GERD (gastroesophageal reflux disease); Osteopenia; Vitamin D deficiency; IBS (irritable bowel syndrome), GERD, hx colon polyps, chronic abd pain - followed by Dr. Sharlett Iles in GI; Slow transit constipation; Hyperparathyroidism ([place]); and Cyst of left ovary on their problem list. Saw dr Loanne Drilling July 24   For edema  And  Had increase in furosemide advised  ROS: See pertinent positives and negatives per HPI. Some joint aches on going  No new meds  Has been on ace and arb in remote past and also chorlthalidone   Past Medical History:  Diagnosis Date   Allergy    Arthritis    Asthma    Colon polyps    tubulovillous adenoma, 2010   External hemorrhoids without mention of complication    GERD (gastroesophageal reflux disease)    Headache    eval with neuro in 2011   Hyperlipemia    Hyperparathyroidism ([place])    seeing Dr. Dwyane Dee in endocrinology   Hypertension    IBS (irritable bowel syndrome), GERD, hx colon polyps, chronic abd pain - followed by Dr. Sharlett Iles in GI 06/11/2012   Osteoporosis 08/29/2011   Vertigo    eval with neuro in 2011, brief recurrence 2016    Family History  Problem Relation Age of Onset   Diabetes Brother    Hypertension  Brother    Stroke Brother 81   Hypertension Father    Stroke Father    Diabetes Sister    Hypertension Sister    Heart disease Sister        heart failure   CAD Mother        Died of MI at age 30   CAD Brother        Died of MI at age 31   Stroke Sister 61   Stroke Brother 22   Colon cancer Neg Hx    Hypercalcemia Neg Hx    Colon polyps Neg Hx    Esophageal cancer Neg Hx    Stomach cancer Neg Hx    Rectal cancer Neg Hx     Social History   Socioeconomic History   Marital status: Widowed    Spouse name: Not on file   Number of children: 2   Years of education: Not on file   Highest education level: Not on file  Occupational History   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  Social Designer, fashion/clothing strain: Not hard at all   Food insecurity    Worry: Never true  Inability: Never true   Transportation needs    Medical: No    Non-medical: No  Tobacco Use   Smoking status: Never Smoker   Smokeless tobacco: Never Used  Substance and Sexual Activity   Alcohol use: No    Alcohol/week: 0.0 standard drinks   Drug use: No   Sexual activity: Not Currently  Lifestyle   Physical activity    Days per week: 0 days    Minutes per session: 0 min   Stress: Not at all  Relationships   Social connections    Talks on phone: Not on file    Gets together: Not on file    Attends religious service: Not on file    Active member of club or organization: Not on file    Attends meetings of clubs or organizations: Not on file    Relationship status: Widowed  Other Topics Concern   Not on file  Social History Narrative   Lives alone in a one story home.  Has 2 children, one son and one daughter with 2 grandkids and 1 great-grandchild.    Retired from working for a bank.     Education: high school.     Outpatient Medications Prior to Visit  Medication Sig Dispense Refill   albuterol (VENTOLIN HFA) 108  (90 Base) MCG/ACT inhaler Inhale 2 puffs into the lungs every 6 (six) hours as needed. 1 Inhaler 0   AMBULATORY NON FORMULARY MEDICATION Medication Name: GI cocktail 5-10 ML every 4-6 hours as needed for pain 450 mL 1   amLODipine (NORVASC) 5 MG tablet Take 1 tablet (5 mg total) by mouth daily. 90 tablet 3   cetirizine (ZYRTEC) 10 MG tablet Take 10 mg by mouth daily.     Cholecalciferol 25 MCG (1000 UT) TBDP Take 1,000 Units by mouth daily. 90 tablet 3   esomeprazole (NEXIUM) 40 MG capsule Take 1 capsule (40 mg total) by mouth 2 (two) times daily before a meal. (Patient taking differently: Take 40 mg by mouth as needed. ) 180 capsule 3   ezetimibe (ZETIA) 10 MG tablet TAKE 1 BY MOUTH DAILY 90 tablet 3   furosemide (LASIX) 20 MG tablet Take 3 tablets (60 mg total) by mouth daily. 90 tablet 5   furosemide (LASIX) 40 MG tablet Take 1 tablet (40 mg total) by mouth daily. 90 tablet 3   rosuvastatin (CRESTOR) 5 MG tablet Take 1/2 tablet by mouth daily. 45 tablet 3   No facility-administered medications prior to visit.      EXAM:  BP (!) 142/72 (BP Location: Right Arm, Patient Position: Sitting, Cuff Size: Normal)    Pulse 100    Temp 98.7 F (37.1 C) (Oral)    Wt 161 lb 3.2 oz (73.1 kg)    SpO2 97%    BMI 29.48 kg/m   Body mass index is 29.48 kg/m.  GENERAL: vitals reviewed and listed above, alert, oriented, appears well hydrated and in no acute distress HEENT: atraumatic, conjunctiva  clear, no obvious abnormalities on inspection of external nose and ears masked  NECK: no obvious masses on inspection palpation  LUNGS: clear to auscultation bilaterally, no wheezes, rales or rhonchi,  CV: HRRR, no clubbing cyanosis   2/6 sem   Has puffy ankles  Bilaterally and then varicose veins  But no redness or ulcer    Pulses intact  MS: moves all extremities walks flat  footed  But independent   Knee no acute swelling  Tender  In post calf  left and min right  No masses  Neg homan? PSYCH:  pleasant and cooperative, no obvious depression or anxiety Lab Results  Component Value Date   WBC 8.5 07/15/2018   HGB 13.2 07/15/2018   HCT 40.1 07/15/2018   PLT 357.0 07/15/2018   GLUCOSE 86 12/06/2018   CHOL 176 07/15/2018   TRIG 97.0 07/15/2018   HDL 65.10 07/15/2018   LDLCALC 91 07/15/2018   ALT 9 10/21/2018   AST 14 10/21/2018   NA 140 12/06/2018   K 3.9 12/06/2018   CL 104 12/06/2018   CREATININE 0.87 12/06/2018   BUN 17 12/06/2018   CO2 29 12/06/2018   TSH 0.93 09/30/2013   INR 1.0 12/07/2008   HGBA1C 5.5 09/30/2013   BP Readings from Last 3 Encounters:  12/13/18 (!) 142/72  12/06/18 (!) 158/72  11/18/18 (!) 152/70   Wt Readings from Last 3 Encounters:  12/13/18 161 lb 3.2 oz (73.1 kg)  12/06/18 163 lb 12.8 oz (74.3 kg)  11/18/18 162 lb 12.8 oz (73.8 kg)    ASSESSMENT AND PLAN:  Discussed the following assessment and plan:   ICD-10-CM   1. Pain of left calf  M79.662 VAS [place] LOWER EXTREMITY VENOUS (DVT)  2. Pain in both lower extremities  M79.604 VAS [place] LOWER EXTREMITY VENOUS (DVT)   M79.605   3. Edema, unspecified type  R60.9 VAS [place] LOWER EXTREMITY VENOUS (DVT)   Uncertain cause  Of pain  but     R/o dvt  Cause cannot explain the newer pain that is progressing   Ant today on Friday  Will try to R/O more emergent problems  Edema  Is slowly progressive   -Patient advised to return or notify health care team  if  new concerns arise. In interim  Plan fu with PCP depending on  Results of doppler  Patient Instructions  Checking leg ultrasounds  For clots and  Vein problems as cause   of pain . And if negative hopefully will have you follow up with Dr Ethlyn Gallery  For next steps .   Your circulation seems ok  And not sure why your are having so much pain at  Night .  Continue with tylenol 500 twice a day and heat for now.   You  probably have some venous insuffiencey that could add to the problem  .    Standley Brooking. Emylia Latella M.D.

## 2018-12-13 ENCOUNTER — Ambulatory Visit (HOSPITAL_COMMUNITY)
Admission: RE | Admit: 2018-12-13 | Discharge: 2018-12-13 | Disposition: A | Discharge status: Home / Self Care | Payer: Medicare Other | Source: Ambulatory Visit | Attending: Internal Medicine | Admitting: Internal Medicine

## 2018-12-13 ENCOUNTER — Ambulatory Visit (INDEPENDENT_AMBULATORY_CARE_PROVIDER_SITE_OTHER): Payer: Medicare Other | Admitting: Internal Medicine

## 2018-12-13 ENCOUNTER — Encounter: Payer: Self-pay | Admitting: Internal Medicine

## 2018-12-13 ENCOUNTER — Telehealth: Payer: Self-pay | Admitting: Internal Medicine

## 2018-12-13 ENCOUNTER — Telehealth: Payer: Self-pay | Admitting: *Deleted

## 2018-12-13 ENCOUNTER — Other Ambulatory Visit: Payer: Self-pay

## 2018-12-13 VITALS — BP 142/72 | HR 100 | Temp 98.7°F | Wt 161.2 lb

## 2018-12-13 DIAGNOSIS — M79662 Pain in left lower leg: Secondary | ICD-10-CM

## 2018-12-13 DIAGNOSIS — R609 Edema, unspecified: Secondary | ICD-10-CM | POA: Diagnosis not present

## 2018-12-13 DIAGNOSIS — M79605 Pain in left leg: Secondary | ICD-10-CM | POA: Diagnosis not present

## 2018-12-13 DIAGNOSIS — M79604 Pain in right leg: Secondary | ICD-10-CM | POA: Diagnosis not present

## 2018-12-13 NOTE — Telephone Encounter (Signed)
rec'd call report from [facility], Vascular Tech., with [facility].  Reported Bilat. LE venous duplex was negative for DVT.   Phone call to office.  Spoke with [place].  Advised of above results, and that report is visible in Epic.  Verb. Understanding.

## 2018-12-13 NOTE — Telephone Encounter (Signed)
Copied from [place] [phone]. Topic: General - Other >> Dec 13, 2018  1:38 PM Rainey Pines A wrote: [facility] and vein specialist of [place] would like a callback to provide patients preliminary report today.

## 2018-12-13 NOTE — Telephone Encounter (Signed)
Pt has been notified.

## 2018-12-13 NOTE — Patient Instructions (Addendum)
Checking leg ultrasounds  For clots and  Vein problems as cause   of pain . And if negative hopefully will have you follow up with Dr Ethlyn Gallery  For next steps .   Your circulation seems ok  And not sure why your are having so much pain at  Night .  Continue with tylenol 500 twice a day and heat for now.   You  probably have some venous insuffiencey that could add to the problem  .

## 2018-12-13 NOTE — Progress Notes (Signed)
Attempted to contact Dr. Velora Mediate office to deliver preliminary results; no answer.

## 2018-12-13 NOTE — Telephone Encounter (Signed)
I called the number below and Christy transferred me to the ultrasound tech.  I  eft a message on the voicemail for the ultrasound tech to return a call to the office.

## 2018-12-16 ENCOUNTER — Telehealth: Payer: Self-pay

## 2018-12-16 ENCOUNTER — Telehealth: Payer: Self-pay | Admitting: Family Medicine

## 2018-12-16 NOTE — Telephone Encounter (Signed)
The patient was told by Dr. Regis Bill to call and make a follow up appointment this week. She needs a morning appointment. It can be virtual or in office. She is not available Wednesday afternoon she has another Dr. Network engineer.  Please Advise

## 2018-12-16 NOTE — Telephone Encounter (Signed)
Patient called today and stated she was told by MD that when lab results came in she would get a call-patient is stating no one has called her-I checked and gave her the lab results with the MD advice about medication increase-patient verbalized an understanding

## 2018-12-16 NOTE — Telephone Encounter (Signed)
If we are able staffing wise we could do a 7:30 visit. I would prefer to see in person if possible. If we don't have staff for this then please see if follow up could be virtual with [facility].

## 2018-12-17 NOTE — Telephone Encounter (Signed)
I left a message for the pt to return my call. 

## 2018-12-18 ENCOUNTER — Encounter: Payer: Self-pay | Admitting: Gastroenterology

## 2018-12-18 ENCOUNTER — Ambulatory Visit (INDEPENDENT_AMBULATORY_CARE_PROVIDER_SITE_OTHER): Payer: Medicare Other | Admitting: Gastroenterology

## 2018-12-18 VITALS — BP 170/82 | HR 92 | Temp 98.1°F | Ht 62.0 in | Wt 160.0 lb

## 2018-12-18 DIAGNOSIS — K581 Irritable bowel syndrome with constipation: Secondary | ICD-10-CM | POA: Diagnosis not present

## 2018-12-18 MED ORDER — TRULANCE 3 MG PO TABS: 1.0000 | ORAL_TABLET | Freq: Every day | ORAL | 11 refills | Status: DC

## 2018-12-18 NOTE — Progress Notes (Signed)
    History of Present Illness: This is an 85 year old female with GERD and IBS-C.  Her reflux symptoms are under very good control on current regimen.  Constipation is managed with true [place] daily and the regular use of MiraLAX however she is not taking MiraLAX daily she is taking it as needed.  She states she notes mild burning abdominal discomfort when using MiraLAX when constipated.  Colonoscopy 10/2013: 1. Sessile polyp measuring 5 mm in the transverse colon; polypectomy performed with a cold snare 2. Mild diverticulosis in the sigmoid colon 3. Large internal hemorrhoids  Current Medications, Allergies, Past Medical History, Past Surgical History, Family History and Social History were reviewed in [employer] record.   Physical Exam: General: Well developed, well nourished, no acute distress Head: Normocephalic and atraumatic Eyes:  sclerae anicteric, EOMI Ears: Normal auditory acuity Mouth: No deformity or lesions Lungs: Clear throughout to auscultation Heart: Regular rate and rhythm; no murmurs, rubs or bruits Abdomen: Soft, non tender and non distended. No masses, hepatosplenomegaly or hernias noted. Normal Bowel sounds Rectal: not done Musculoskeletal: Symmetrical with no gross deformities  Pulses:  Normal pulses noted Extremities: No clubbing, cyanosis, edema or deformities noted Neurological: Alert oriented x 4, grossly nonfocal Psychological:  Alert and cooperative. Normal mood and affect   Assessment and Recommendations:  1. IBS-C.  Adequate daily water intake and maintain a tolerable amount of fiber daily.  Trulance 3 mg po daily.  MiraLAX or MOM daily (not prn). REV in 1 year.   2.  GERD.  Follow antireflux measures and continue Nexium 40 mg daily.  3.  Personal history of adenomatous colon polyps, TVA in 2010.  Last colonoscopy in 2015 did not have any precancerous colon polyps.  No future surveillance colonoscopies planned due to age.

## 2018-12-18 NOTE — Telephone Encounter (Signed)
Robin [name] spoke with the pt and scheduled an appt for 8/17.

## 2018-12-18 NOTE — Patient Instructions (Signed)
We have sent the following medications to your pharmacy for you to pick up at your convenience: Trulance.  You can also use Miralax or Milk of magnesia as needed for constipation along with Trulance.   Thank you for choosing me and [facility].  Pricilla Riffle. Dagoberto Ligas., MD., Marval Regal

## 2018-12-25 ENCOUNTER — Telehealth: Payer: Self-pay | Admitting: Endocrinology

## 2018-12-25 ENCOUNTER — Other Ambulatory Visit: Payer: Self-pay

## 2018-12-25 MED ORDER — FUROSEMIDE 40 MG PO TABS: 60.0000 mg | ORAL_TABLET | Freq: Every day | ORAL | 0 refills | Status: DC

## 2018-12-25 NOTE — Telephone Encounter (Signed)
Please increase to 4x20 mg qd. F/u ov next avail please

## 2018-12-25 NOTE — Telephone Encounter (Signed)
Returned pt call. States she has noticed no improvement to the edema in her upper and lower extremities. C/o "aching" to her extremities. Does state she notices improvement with elevation of extremtities. Confirmed she is taking Lasix 60mg  daily (treatment for hypercalcemia/HTN). She states she does not want to increase this medication to resolve or reduce the edema. But would rather "swtich" to another medication. Advised Dr. Loanne Drilling is unavailable until August 17th. Asked if she would be okay with me informing the on call provider about her concerns. Pt agreeable. Also inquired, if on call provider would like to see pt for further evaluation, could she come in for an appt. Pt states, "not today but later this week." Message routed to Dr. Kelton Pillar for review and advice.

## 2018-12-25 NOTE — Telephone Encounter (Signed)
Patient called and stated "she wanted to leave a message for Dr Cordelia Pen assistant to call her back.  When questions just said it was about some medicine and that she would discuss with Dr Cordelia Pen assistant."

## 2018-12-25 NOTE — Telephone Encounter (Signed)
Please review pt concern and advise 

## 2018-12-26 NOTE — Telephone Encounter (Signed)
Please continue the same medication I'll see you next week

## 2018-12-26 NOTE — Telephone Encounter (Signed)
Spoke to patient, she does not want to increase, she wants to take something else in place of Lasix.  Please advise.  OV made for next week.

## 2018-12-30 ENCOUNTER — Encounter: Payer: Self-pay | Admitting: Family Medicine

## 2018-12-30 ENCOUNTER — Ambulatory Visit (INDEPENDENT_AMBULATORY_CARE_PROVIDER_SITE_OTHER): Payer: Medicare Other | Admitting: Family Medicine

## 2018-12-30 ENCOUNTER — Other Ambulatory Visit: Payer: Self-pay

## 2018-12-30 VITALS — BP 188/88 | HR 76 | Temp 98.2°F | Wt 164.0 lb

## 2018-12-30 DIAGNOSIS — M79604 Pain in right leg: Secondary | ICD-10-CM | POA: Diagnosis not present

## 2018-12-30 DIAGNOSIS — I10 Essential (primary) hypertension: Secondary | ICD-10-CM

## 2018-12-30 DIAGNOSIS — M79605 Pain in left leg: Secondary | ICD-10-CM | POA: Diagnosis not present

## 2018-12-30 DIAGNOSIS — E213 Hyperparathyroidism, unspecified: Secondary | ICD-10-CM | POA: Diagnosis not present

## 2018-12-30 DIAGNOSIS — R600 Localized edema: Secondary | ICD-10-CM | POA: Diagnosis not present

## 2018-12-30 NOTE — Patient Instructions (Addendum)
Restart the lasix 20mg . Take this twice daily - morning and then afternoon (2-3pm).  If you do not notice that you have increased urination after taking lasix - within 2 hours; then please increase to 40mg  twice daily.   Split the amlodipine dose to twice daily (1/2 tab in morning 2.5mg , 1/2 tab at night 2.5mg )  *check blood pressures daily.   Bring home cuff to office.   Consider compression stockings- I would think about looking for 74mmHg-30mmHg. You can get these on amazon or [employer] (nursing).

## 2018-12-30 NOTE — Progress Notes (Signed)
HPI: FU dizziness. ABIs August 2010 normal. Stress echocardiogram February 2014 normal. MRI July 2018 showed chronic small vessel ischemic changes in the cerebral white matter and pons. MRA without significant vertebrobasilar disease. CTA showed normal circle of Willis. EEG also normal.Echo 10/18 showed normal LV function, grade 1 DD, mild AS with mean gradient 12 mmHg). Patient has chronic mild dizziness.   Extremity venous Dopplers July 2020 showed no DVT.  Since last seen,  she denies dyspnea on exertion, orthopnea, PND, syncope or chest pain.  Occasional mild dizziness which is unchanged.  She has developed bilateral lower extremity edema.  Current Outpatient Medications  Medication Sig Dispense Refill  . AMBULATORY NON FORMULARY MEDICATION Medication Name: GI cocktail 5-10 ML every 4-6 hours as needed for pain 450 mL 1  . amLODipine (NORVASC) 5 MG tablet Take 1 tablet (5 mg total) by mouth daily. 90 tablet 3  . cetirizine (ZYRTEC) 10 MG tablet Take 10 mg by mouth daily.    . Cholecalciferol 25 MCG (1000 UT) TBDP Take 1,000 Units by mouth daily. 90 tablet 3  . esomeprazole (NEXIUM) 40 MG capsule Take 1 capsule (40 mg total) by mouth 2 (two) times daily before a meal. (Patient taking differently: Take 40 mg by mouth as needed. ) 180 capsule 3  . ezetimibe (ZETIA) 10 MG tablet TAKE 1 BY MOUTH DAILY 90 tablet 3  . furosemide (LASIX) 40 MG tablet Take 1.5 tablets (60 mg total) by mouth daily. 45 tablet 0  . Plecanatide (TRULANCE) 3 MG TABS Take 1 tablet by mouth daily. 30 tablet 11  . rosuvastatin (CRESTOR) 5 MG tablet Take 1/2 tablet by mouth daily. 45 tablet 3   No current facility-administered medications for this visit.      Past Medical History:  Diagnosis Date  . Allergy   . Arthritis   . Asthma   . Colon polyps    tubulovillous adenoma, 2010  . External hemorrhoids without mention of complication   . GERD (gastroesophageal reflux disease)   . Headache    eval with neuro  in 2011  . Hyperlipemia   . Hyperparathyroidism [facility])    seeing Dr. Dwyane Dee in endocrinology  . Hypertension   . IBS (irritable bowel syndrome), GERD, hx colon polyps, chronic abd pain - followed by Dr. Sharlett Iles in GI 06/11/2012  . Osteoporosis 08/29/2011  . Vertigo    eval with neuro in 2011, brief recurrence 2016    Past Surgical History:  Procedure Laterality Date  . BACK SURGERY  1974  . CARPAL TUNNEL RELEASE Left 06/2008  . COLONOSCOPY    . EYE SURGERY  07/2017   bilateral cataract extraction  . POLYPECTOMY    . TONSILLECTOMY    . UPPER GASTROINTESTINAL ENDOSCOPY      Social History   Socioeconomic History  . Marital status: Widowed    Spouse name: Not on file  . Number of children: 2  . Years of education: Not on file  . Highest education level: Not on file  Occupational History  . 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  Social Needs  . Financial resource strain: Not hard at all  . Food insecurity    Worry: Never true    Inability: Never true  . Transportation needs    Medical: No    Non-medical: No  Tobacco Use  . Smoking status: Never Smoker  . Smokeless tobacco: Never Used  Substance and Sexual Activity  .  Alcohol use: No    Alcohol/week: 0.0 standard drinks  . Drug use: No  . Sexual activity: Not Currently  Lifestyle  . Physical activity    Days per week: 0 days    Minutes per session: 0 min  . Stress: Not at all  Relationships  . Social Herbalist on phone: Not on file    Gets together: Not on file    Attends religious service: Not on file    Active member of club or organization: Not on file    Attends meetings of clubs or organizations: Not on file    Relationship status: Widowed  . Intimate partner violence    Fear of current or ex partner: Not on file    Emotionally abused: Not on file    Physically abused: Not on file    Forced sexual activity: Not on file  Other Topics Concern  . Not on file   Social History Narrative   Lives alone in a one story home.  Has 2 children, one son and one daughter with 2 grandkids and 1 great-grandchild.    Retired from working for a bank.     Education: high school.     Family History  Problem Relation Age of Onset  . Diabetes Brother   . Hypertension Brother   . Stroke Brother 72  . Hypertension Father   . Stroke Father   . Diabetes Sister   . Hypertension Sister   . Heart disease Sister        heart failure  . CAD Mother        Died of MI at age 48  . CAD Brother        Died of MI at age 57  . Stroke Sister 73  . Stroke Brother 6  . Colon cancer Neg Hx   . Hypercalcemia Neg Hx   . Colon polyps Neg Hx   . Esophageal cancer Neg Hx   . Stomach cancer Neg Hx   . Rectal cancer Neg Hx     ROS: no fevers or chills, productive cough, hemoptysis, dysphasia, odynophagia, melena, hematochezia, dysuria, hematuria, rash, seizure activity, orthopnea, PND, claudication. Remaining systems are negative.  Physical Exam: Well-developed well-nourished in no acute distress.  Skin is warm and dry.  HEENT is normal.  Neck is supple.  Chest is clear to auscultation with normal expansion.  Cardiovascular exam is regular rate and rhythm. 2/6 systolic murmur LSB Abdominal exam nontender or distended. No masses palpated. Extremities show trace edema. neuro grossly intact  ECG-sinus rhythm at a rate of 78, left axis deviation, left ventricular hypertrophy.  Personally reviewed  A/P  1 near syncope-patient has had no further near syncopal episodes.  She has chronic dizziness that is mild.  No further evaluation at this time.  2 hypertension-patient has developed bilateral lower extremity edema.  Amlodipine certainly could be contributing.  We will discontinue.  I will add losartan 50 mg daily.  Check potassium and renal function in 1 week.  Follow blood pressure and adjust regimen as needed.  3 Hyperlipidemia-continue statin.  4 history of mild  aortic stenosis-still mild on examination.  She will need follow-up echoes in the future.  Kirk Ruths, MD

## 2018-12-30 NOTE — Progress Notes (Signed)
Robin [name] DOB: [birth year] Encounter date: 12/30/2018  This is a 85 y.o. female who presents with Chief Complaint  Patient presents with  . Follow-up    History of present illness: Saw Dr. Regis Bill on 7/31 with leg swelling in both legs. Pain had started a few weeks earlier/swelling started a few months ago. Doppler ordered was negative for DVT.   Legs are doing better in terms of calf (left) that was hurting. Thinks that pain might be coming from back. Went through therapy for 8 visits and feels this was worse thing she could have done. Back seems worse. Using heat, ice.   Last MRI was 2011. Most of pain in back now is bilat SI region.   Still with a lot of swelling in both legs. Has been taking lasix 20mg  at baseline, but went back to Dr. Loanne Drilling and lasix was increased to 40mg  - took down swelling some, but not great improvement. He increased to 60mg  but she was not able to tolerate due to cramping that was severe in arms/legs. Still not taking down swelling.   Pain is pretty constant in left calf at about 4/10. Tolerable at this point.    Allergies  Allergen Reactions  . Prilosec [Omeprazole] Cough  . Amoxicillin     REACTION: rash  . Metronidazole     REACTION: rash  . Protonix [Pantoprazole Sodium] Swelling   Current Meds  Medication Sig  . AMBULATORY NON FORMULARY MEDICATION Medication Name: GI cocktail 5-10 ML every 4-6 hours as needed for pain  . amLODipine (NORVASC) 5 MG tablet Take 1 tablet (5 mg total) by mouth daily.  . cetirizine (ZYRTEC) 10 MG tablet Take 10 mg by mouth daily.  . Cholecalciferol 25 MCG (1000 UT) TBDP Take 1,000 Units by mouth daily.  Marland Kitchen esomeprazole (NEXIUM) 40 MG capsule Take 1 capsule (40 mg total) by mouth 2 (two) times daily before a meal. (Patient taking differently: Take 40 mg by mouth as needed. )  . ezetimibe (ZETIA) 10 MG tablet TAKE 1 BY MOUTH DAILY  . furosemide (LASIX) 40 MG tablet Take 1.5 tablets (60 mg total) by mouth daily.  Marland Kitchen  Plecanatide (TRULANCE) 3 MG TABS Take 1 tablet by mouth daily.  . rosuvastatin (CRESTOR) 5 MG tablet Take 1/2 tablet by mouth daily.    Review of Systems  Constitutional: Negative for chills, fatigue and fever.  Respiratory: Negative for cough, chest tightness, shortness of breath and wheezing.   Cardiovascular: Negative for chest pain, palpitations and leg swelling.  Musculoskeletal: Positive for back pain.    Objective:  BP (!) 188/88   Pulse 76   Temp 98.2 F (36.8 C) (Oral)   Wt 164 lb (74.4 kg)   SpO2 90%   BMI 30.00 kg/m   Weight: 164 lb (74.4 kg)   BP Readings from Last 3 Encounters:  12/30/18 (!) 188/88  12/18/18 (!) 170/82  12/13/18 (!) 142/72   Wt Readings from Last 3 Encounters:  12/30/18 164 lb (74.4 kg)  12/18/18 160 lb (72.6 kg)  12/13/18 161 lb 3.2 oz (73.1 kg)  bp recheck was still elevated at 188/90. She did NOT take her medications this morning.   Physical Exam Constitutional:      General: She is not in acute distress.    Appearance: She is well-developed.  Cardiovascular:     Rate and Rhythm: Normal rate and regular rhythm.     Heart sounds: Murmur present. Systolic murmur present with a grade of 2/6. No friction  rub.  Pulmonary:     Effort: Pulmonary effort is normal. No respiratory distress.     Breath sounds: Normal breath sounds. No wheezing or rales.  Musculoskeletal:     Right lower leg: No edema.     Left lower leg: No edema.  Neurological:     Mental Status: She is alert and oriented to person, place, and time.  Psychiatric:        Behavior: Behavior normal.     Assessment/Plan  1. Bilateral lower extremity edema She is hesitant to try any new medications due to difficulty with tolerating these. She has not done well with most categories of bp medications in past including hctz, chlorthalidone, hydralazine, lisinopril, lopressor, spironolactone.   Since she tolerated the lasix at 20mg  well and renal function stable, we are going to  have her increase to BID to see if this helps. If she doesn't notice improvement in swelling within a few days of this she can increase to 40mg  bid which is a dose she tolerated better previously. I am hoping that it is just the higher 60mg  dose that causes sx for her. We will recheck bmp in 3 weeks.   2. Pain in both lower extremities Slightly improved. Encouraged her to consider injections from ortho that were discussed. We reviewed that SI injection mentioned in note would be right in area of discomfort. She would consider additional imaging but would need sedation and open MRI if this was ordered.   3. Essential hypertension bp is not well controlled. She did not take medication today. She will check at home. If still getting similar elevations in blood pressure then she will call me back. Restarting lasix should also help. I did advise split in amlodipine - perhaps this will keep her more even pressure and minimize swelling.   4. Hyperparathyroidism ([place]) Following with Dr. Loanne Drilling.    Return in about 2 weeks (around 01/13/2019) for blood pressure recheck.     Micheline Rough, MD

## 2019-01-01 ENCOUNTER — Ambulatory Visit: Payer: Medicare Other | Admitting: Endocrinology

## 2019-01-06 ENCOUNTER — Other Ambulatory Visit: Payer: Self-pay

## 2019-01-06 ENCOUNTER — Encounter: Payer: Self-pay | Admitting: Cardiology

## 2019-01-06 ENCOUNTER — Ambulatory Visit (INDEPENDENT_AMBULATORY_CARE_PROVIDER_SITE_OTHER): Payer: Medicare Other | Admitting: Cardiology

## 2019-01-06 VITALS — BP 144/74 | HR 78 | Temp 97.8°F | Ht 62.0 in | Wt 164.0 lb

## 2019-01-06 DIAGNOSIS — I10 Essential (primary) hypertension: Secondary | ICD-10-CM | POA: Diagnosis not present

## 2019-01-06 DIAGNOSIS — E7800 Pure hypercholesterolemia, unspecified: Secondary | ICD-10-CM

## 2019-01-06 DIAGNOSIS — I350 Nonrheumatic aortic (valve) stenosis: Secondary | ICD-10-CM | POA: Diagnosis not present

## 2019-01-06 MED ORDER — LOSARTAN POTASSIUM 50 MG PO TABS: 50.0000 mg | ORAL_TABLET | Freq: Every day | ORAL | 3 refills | Status: DC

## 2019-01-06 NOTE — Patient Instructions (Signed)
Medication Instructions:  STOP AMLODIPINE  START LOSARTAN 50 MG ONCE DAILY  If you need a refill on your cardiac medications before your next appointment, please call your pharmacy.   Lab work: Your physician recommends that you return for lab work in: [place]  If you have labs (blood work) drawn today and your tests are completely normal, you will receive your results only by: Marland Kitchen MyChart Message (if you have MyChart) OR . A paper copy in the mail If you have any lab test that is abnormal or we need to change your treatment, we will call you to review the results.  Follow-Up: At [facility], you and your health needs are our priority.  As part of our continuing mission to provide you with exceptional heart care, we have created designated Provider Care Teams.  These Care Teams include your primary Cardiologist (physician) and Advanced Practice Providers (APPs -  Physician Assistants and Nurse Practitioners) who all work together to provide you with the care you need, when you need it. You will need a follow up appointment in 6 months.  Please call our office 2 months in advance to schedule this appointment.  You may see Kirk Ruths MD or one of the following Advanced Practice Providers on your designated Care Team:   Kerin Ransom, PA-C Roby Lofts, [place] . Sande Rives, PA-C

## 2019-01-13 ENCOUNTER — Ambulatory Visit
Admission: RE | Admit: 2019-01-13 | Discharge: 2019-01-13 | Disposition: A | Discharge status: Home / Self Care | Payer: Medicare Other | Source: Ambulatory Visit | Attending: Family Medicine | Admitting: Family Medicine

## 2019-01-13 ENCOUNTER — Other Ambulatory Visit: Payer: Self-pay

## 2019-01-13 DIAGNOSIS — Z1231 Encounter for screening mammogram for malignant neoplasm of breast: Secondary | ICD-10-CM | POA: Diagnosis not present

## 2019-01-13 DIAGNOSIS — I10 Essential (primary) hypertension: Secondary | ICD-10-CM | POA: Diagnosis not present

## 2019-01-13 LAB — BASIC METABOLIC PANEL
BUN/Creatinine Ratio: 22 (ref 12–28)
BUN: 18 mg/dL (ref 8–27)
CO2: 24 mmol/L (ref 20–29)
Calcium: 11.3 mg/dL — ABNORMAL HIGH (ref 8.7–10.3)
Chloride: 103 mmol/L (ref 96–106)
Creatinine, Ser: 0.82 mg/dL (ref 0.57–1.00)
GFR calc Af Amer: 77 mL/min/{1.73_m2} (ref >59)
GFR calc non Af Amer: 66 mL/min/{1.73_m2} (ref >59)
Glucose: 86 mg/dL (ref 65–99)
Potassium: 4.9 mmol/L (ref 3.5–5.2)
Sodium: 141 mmol/L (ref 134–144)

## 2019-01-14 ENCOUNTER — Encounter: Payer: Self-pay | Admitting: *Deleted

## 2019-01-15 ENCOUNTER — Ambulatory Visit (INDEPENDENT_AMBULATORY_CARE_PROVIDER_SITE_OTHER): Payer: Medicare Other | Admitting: Family Medicine

## 2019-01-15 ENCOUNTER — Encounter: Payer: Self-pay | Admitting: Family Medicine

## 2019-01-15 ENCOUNTER — Other Ambulatory Visit: Payer: Self-pay

## 2019-01-15 VITALS — BP 170/82 | HR 92 | Temp 98.2°F | Ht 62.0 in | Wt 159.8 lb

## 2019-01-15 DIAGNOSIS — E213 Hyperparathyroidism, unspecified: Secondary | ICD-10-CM

## 2019-01-15 DIAGNOSIS — E7800 Pure hypercholesterolemia, unspecified: Secondary | ICD-10-CM

## 2019-01-15 DIAGNOSIS — E559 Vitamin D deficiency, unspecified: Secondary | ICD-10-CM

## 2019-01-15 DIAGNOSIS — I10 Essential (primary) hypertension: Secondary | ICD-10-CM

## 2019-01-15 DIAGNOSIS — M791 Myalgia, unspecified site: Secondary | ICD-10-CM

## 2019-01-15 MED ORDER — FUROSEMIDE 20 MG PO TABS: 20.0000 mg | ORAL_TABLET | Freq: Every day | ORAL | Status: DC | PRN

## 2019-01-15 NOTE — Patient Instructions (Addendum)
Increase the losartan to 1.5 tablets daily (75mg )  Stop the lasix but keep on hands for once daily as needed use. Check weights daily. If daily weight increases more than 3 pounds or if you gain more than 5 pounds in a week, then restart just once daily 20mg  lasix.   Repeat bloodwork pending my update (I am going to see if we can add bloodwork to what Dr. Jacalyn Lefevre order).   If you are not noting improvement in symptoms in 1 weeks time, then hold the crestor for 2 weeks time.

## 2019-01-15 NOTE — Progress Notes (Signed)
Robin [name] DOB: [birth year] Encounter date: 01/15/2019  This is a 85 y.o. female who presents with Chief Complaint  Patient presents with  . Follow-up    History of present illness: Just doesn't feel well. Feels that lasix has contributed to aches/pains all over. Not getting cramps in muscles, more like weakness. States now that she had idea in her head that Lasix is causing her problems, she cannot stop worrying about this and really wants to try to hold this medication.  Heart doctor took her off amlodipine and started her on losartan. BP have elevated some since starting this.  She did bring readings in the office here today.  They have been in the high 140s to low 150s typically over high [id number] to 0000000 diastolic.  Swelling has been a little better, but hasn't been doing anything for a couple of days.  Sitting with legs elevated.  States has always had dizziness. Not worse than baseline.   Still has pain in left calf - feels like this is coming from back. Pain in back is bothering her.    Allergies  Allergen Reactions  . Prilosec [Omeprazole] Cough  . Amoxicillin     REACTION: rash  . Metronidazole     REACTION: rash  . Protonix [Pantoprazole Sodium] Swelling   Current Meds  Medication Sig  . AMBULATORY NON FORMULARY MEDICATION Medication Name: GI cocktail 5-10 ML every 4-6 hours as needed for pain  . cetirizine (ZYRTEC) 10 MG tablet Take 10 mg by mouth daily.  . Cholecalciferol 25 MCG (1000 UT) TBDP Take 1,000 Units by mouth daily.  Marland Kitchen esomeprazole (NEXIUM) 40 MG capsule Take 1 capsule (40 mg total) by mouth 2 (two) times daily before a meal. (Patient taking differently: Take 40 mg by mouth as needed. )  . ezetimibe (ZETIA) 10 MG tablet TAKE 1 BY MOUTH DAILY  . furosemide (LASIX) 20 MG tablet Take 1 tablet (20 mg total) by mouth daily as needed.  Marland Kitchen losartan (COZAAR) 50 MG tablet Take 1 tablet (50 mg total) by mouth daily.  Marland Kitchen Plecanatide (TRULANCE) 3 MG TABS Take 1 tablet by  mouth daily.  . rosuvastatin (CRESTOR) 5 MG tablet Take 1/2 tablet by mouth daily.  . [DISCONTINUED] furosemide (LASIX) 40 MG tablet Take 1.5 tablets (60 mg total) by mouth daily.    Review of Systems  Constitutional: Positive for fatigue. Negative for chills and fever.  Respiratory: Negative for cough, chest tightness, shortness of breath and wheezing.   Cardiovascular: Negative for chest pain, palpitations and leg swelling (improved).    Objective:  BP (!) 170/82 (BP Location: Left Arm, Patient Position: Sitting, Cuff Size: Normal)   Pulse 92   Temp 98.2 F (36.8 C) (Temporal)   Ht 5\' 2"  (1.575 m)   Wt 159 lb 12.8 oz (72.5 kg)   SpO2 98%   BMI 29.23 kg/m   Weight: 159 lb 12.8 oz (72.5 kg)   Check with home cuff in the office was 175/91 and recheck by me was 174/90.  BP Readings from Last 3 Encounters:  01/15/19 (!) 170/82  01/06/19 (!) 144/74  12/30/18 (!) 188/88   Wt Readings from Last 3 Encounters:  01/15/19 159 lb 12.8 oz (72.5 kg)  01/06/19 164 lb (74.4 kg)  12/30/18 164 lb (74.4 kg)    Physical Exam Constitutional:      General: She is not in acute distress.    Appearance: She is well-developed.  Cardiovascular:     Rate and Rhythm:  Normal rate and regular rhythm.     Heart sounds: Murmur present. Crescendo  decrescendo  systolic murmur present with a grade of 2/6. No friction rub.  Pulmonary:     Effort: Pulmonary effort is normal. No respiratory distress.     Breath sounds: Normal breath sounds. No wheezing or rales.  Musculoskeletal:     Right lower leg: No edema.     Left lower leg: No edema.  Neurological:     Mental Status: She is alert and oriented to person, place, and time.  Psychiatric:        Behavior: Behavior normal.     Assessment/Plan  1. Essential hypertension Blood pressure is still elevated.  Will increase losartan to 1.5 tablets daily (75 mg).  She has been on Lasix very long-term.  I am not sure how much this is helping with  calcium regulation or blood pressure control.  She is very tearful in the office today and would really like to try to see how she does without the Lasix.  Her edema has resolved since last visit.  We will do a as needed only trial until next week and we can repeat blood work.  Echo was stable when last completed in 2018.  I did ask her to check daily weights so we can ensure she is not putting on fluid.  She was given instructions to take her Lasix dosing 3 pound weight gain in a day or noting swelling of extremities.  2. Hyperparathyroidism ([place]) She canceled her visit with endocrinology.  Although she would not elaborate on specific details, I think her main concern was elevation of Lasix which made her feel worse.  She does need to be managed by a specialist for her hyperparathyroidism.  We will discuss this again next week.  We discussed that if this condition is not controlled, it can contribute to her not feeling well.  We reviewed her recent elevated calcium level.  We will recheck blood work next week and can further direct from there. - PTH, Intact and Calcium; Future  3. Vitamin D deficiency Currently taking 1000 units daily of vitamin D.  4. Muscle pain Patient feels this is related to her Lasix.  We discussed that it could be related to calcium levels or even side effect from Crestor even though she has been on that medication for multiple years.  I have given instructions to hold the Crestor if not noting improvement with holding the Lasix over the next week's time.  I will also check a CK with her upcoming blood work. - CK; Future  5. HYPERCHOLESTEROLEMIA On crestor currently. May need to hold to see if this helps with leg muscle discomfort.  - Lipid panel; Future - Comprehensive metabolic panel; Future - TSH; Future   Return for pending bloodwork results. Micheline Rough, MD

## 2019-01-16 ENCOUNTER — Telehealth: Payer: Self-pay | Admitting: *Deleted

## 2019-01-16 DIAGNOSIS — E213 Hyperparathyroidism, unspecified: Secondary | ICD-10-CM

## 2019-01-16 NOTE — Telephone Encounter (Signed)
Patient informed of the message below and stated she would like to see another endocrinologist and is aware the referral was placed.  Lab appt scheduled for 9/8.

## 2019-01-16 NOTE — Telephone Encounter (Signed)
-----   Message from [name], MD sent at 01/16/2019 12:33 PM EDT ----- I would like for her to be scheduled for bloodwork next week. She does need to follow with endocrinologist. If she is serious about not seeing Dr. Loanne Drilling again, then we need to put in referral for another endocrinologist. If she is feeling any worse she needs to let us know; otherwise I'll be in touch once I see bloodwork results return. Stopping the lasix could also further increase her calcium but we will recheck this with bloodwork next week.

## 2019-01-21 ENCOUNTER — Telehealth: Payer: Self-pay | Admitting: Family Medicine

## 2019-01-21 ENCOUNTER — Other Ambulatory Visit (INDEPENDENT_AMBULATORY_CARE_PROVIDER_SITE_OTHER): Payer: Medicare Other

## 2019-01-21 ENCOUNTER — Other Ambulatory Visit: Payer: Self-pay

## 2019-01-21 DIAGNOSIS — E7800 Pure hypercholesterolemia, unspecified: Secondary | ICD-10-CM | POA: Diagnosis not present

## 2019-01-21 DIAGNOSIS — E213 Hyperparathyroidism, unspecified: Secondary | ICD-10-CM

## 2019-01-21 DIAGNOSIS — M791 Myalgia, unspecified site: Secondary | ICD-10-CM

## 2019-01-21 LAB — COMPREHENSIVE METABOLIC PANEL
ALT: 8 U/L (ref 0–35)
AST: 14 U/L (ref 0–37)
Albumin: 4.1 g/dL (ref 3.5–5.2)
Alkaline Phosphatase: 96 U/L (ref 39–117)
BUN: 15 mg/dL (ref 6–23)
CO2: 27 mEq/L (ref 19–32)
Calcium: 10.5 mg/dL (ref 8.4–10.5)
Chloride: 104 mEq/L (ref 96–112)
Creatinine, Ser: 0.77 mg/dL (ref 0.40–1.20)
GFR: 71.58 mL/min (ref >60.00)
Glucose, Bld: 85 mg/dL (ref 70–99)
Potassium: 4.4 mEq/L (ref 3.5–5.1)
Sodium: 140 mEq/L (ref 135–145)
Total Bilirubin: 0.5 mg/dL (ref 0.2–1.2)
Total Protein: 6.6 g/dL (ref 6.0–8.3)

## 2019-01-21 LAB — LIPID PANEL
Cholesterol: 171 mg/dL (ref 0–200)
HDL: 54.3 mg/dL (ref >39.00)
LDL Cholesterol: 101 mg/dL — ABNORMAL HIGH (ref 0–99)
NonHDL: 116.64
Total CHOL/HDL Ratio: 3
Triglycerides: 78 mg/dL (ref 0.0–149.0)
VLDL: 15.6 mg/dL (ref 0.0–40.0)

## 2019-01-21 LAB — TSH: TSH: 1.67 u[IU]/mL (ref 0.35–4.50)

## 2019-01-21 LAB — CK: Total CK: 42 U/L (ref 7–177)

## 2019-01-21 NOTE — Telephone Encounter (Signed)
Patient came in wanting to know if [name] can send in lastrian 75 mg    Send to:  [place], [place]. [phone] (Phone) [phone] (Fax)   Can you please call the patient to let her know if you are going to change the medication or just leave it as it is.

## 2019-01-22 ENCOUNTER — Other Ambulatory Visit: Payer: Self-pay | Admitting: Family Medicine

## 2019-01-22 DIAGNOSIS — I10 Essential (primary) hypertension: Secondary | ICD-10-CM

## 2019-01-22 LAB — PTH, INTACT AND CALCIUM
Calcium: 10.5 mg/dL — ABNORMAL HIGH (ref 8.6–10.4)
PTH: 61 pg/mL (ref 14–64)

## 2019-01-22 MED ORDER — LOSARTAN POTASSIUM 50 MG PO TABS: 75.0000 mg | ORAL_TABLET | Freq: Every day | ORAL | 1 refills | Status: DC

## 2019-01-22 NOTE — Telephone Encounter (Signed)
I tried to call her to get numbers for bp and see how running but she didn't anwer. Please see if you can get in touch: We might need to further increase, but usually takes 2 weeks to fully adjust to med change so she can also update next week if possible.   I did (in meanwhile) go ahead and send in 75mg  (1.5 tablets of 50mg ) losartan daily to pharmacy.

## 2019-01-22 NOTE — Telephone Encounter (Signed)
Can you confirm what med she is asking for?

## 2019-01-22 NOTE — Telephone Encounter (Signed)
Left a detailed message for the pt to return a call with information as below.  CRM also created.

## 2019-01-22 NOTE — Telephone Encounter (Signed)
Pt stated the Rx for losartan (COZAAR) 50 MG tablet was increased to 75 mg so she is beginning to run out of the medication. Pt stated she is still experiencing elevated readings and needs the new Rx dosage increased to 75 MG.

## 2019-01-23 NOTE — Telephone Encounter (Signed)
Called the pt and informed her of the message below.  Stated she has not started the new dose yet, but readings have been 173/92 and others have been in the 150, 160, [id number] for systolic readings.  Agreed to call back next week and message sent to Dr Ethlyn Gallery.

## 2019-01-24 NOTE — Telephone Encounter (Signed)
See if you can schedule at least phone follow up in 2 weeks time so we have point to touch base on. Encourage her to start med change today. It is not safe for blood pressures to be continuously elevated, and since we took away lasix pressures are going to run even a little higher.   If she has trouble with increase just let me know. Doesn't have to wait for that appointment to communicate problems.

## 2019-01-24 NOTE — Telephone Encounter (Signed)
Patient informed of the message below.  Appt scheduled for 9/30 at 1:30pm.

## 2019-02-03 ENCOUNTER — Other Ambulatory Visit: Payer: Self-pay

## 2019-02-03 ENCOUNTER — Emergency Department (HOSPITAL_COMMUNITY): Payer: Medicare Other

## 2019-02-03 ENCOUNTER — Observation Stay (HOSPITAL_COMMUNITY)
Admission: EM | Admit: 2019-02-03 | Discharge: 2019-02-04 | Disposition: A | Discharge status: Home / Self Care | Payer: Medicare Other | Attending: Internal Medicine | Admitting: Internal Medicine

## 2019-02-03 ENCOUNTER — Ambulatory Visit: Payer: Self-pay | Admitting: *Deleted

## 2019-02-03 ENCOUNTER — Encounter (HOSPITAL_COMMUNITY): Payer: Self-pay | Admitting: Emergency Medicine

## 2019-02-03 DIAGNOSIS — J45909 Unspecified asthma, uncomplicated: Secondary | ICD-10-CM | POA: Diagnosis not present

## 2019-02-03 DIAGNOSIS — R0600 Dyspnea, unspecified: Secondary | ICD-10-CM | POA: Diagnosis not present

## 2019-02-03 DIAGNOSIS — Z880 Allergy status to penicillin: Secondary | ICD-10-CM | POA: Diagnosis not present

## 2019-02-03 DIAGNOSIS — I700 Atherosclerosis of aorta: Secondary | ICD-10-CM | POA: Diagnosis not present

## 2019-02-03 DIAGNOSIS — R0602 Shortness of breath: Secondary | ICD-10-CM | POA: Diagnosis not present

## 2019-02-03 DIAGNOSIS — K219 Gastro-esophageal reflux disease without esophagitis: Secondary | ICD-10-CM | POA: Diagnosis not present

## 2019-02-03 DIAGNOSIS — Z20828 Contact with and (suspected) exposure to other viral communicable diseases: Secondary | ICD-10-CM | POA: Diagnosis not present

## 2019-02-03 DIAGNOSIS — Z79899 Other long term (current) drug therapy: Secondary | ICD-10-CM | POA: Diagnosis not present

## 2019-02-03 DIAGNOSIS — E7800 Pure hypercholesterolemia, unspecified: Secondary | ICD-10-CM | POA: Diagnosis not present

## 2019-02-03 DIAGNOSIS — E785 Hyperlipidemia, unspecified: Secondary | ICD-10-CM | POA: Diagnosis present

## 2019-02-03 DIAGNOSIS — I10 Essential (primary) hypertension: Secondary | ICD-10-CM | POA: Diagnosis not present

## 2019-02-03 DIAGNOSIS — I160 Hypertensive urgency: Secondary | ICD-10-CM | POA: Diagnosis not present

## 2019-02-03 DIAGNOSIS — K449 Diaphragmatic hernia without obstruction or gangrene: Secondary | ICD-10-CM | POA: Diagnosis not present

## 2019-02-03 DIAGNOSIS — Z881 Allergy status to other antibiotic agents status: Secondary | ICD-10-CM | POA: Diagnosis not present

## 2019-02-03 DIAGNOSIS — E213 Hyperparathyroidism, unspecified: Secondary | ICD-10-CM | POA: Diagnosis not present

## 2019-02-03 LAB — BASIC METABOLIC PANEL
Anion gap: 9 (ref 5–15)
BUN: 12 mg/dL (ref 8–23)
CO2: 24 mmol/L (ref 22–32)
Calcium: 10.6 mg/dL — ABNORMAL HIGH (ref 8.9–10.3)
Chloride: 107 mmol/L (ref 98–111)
Creatinine, Ser: 0.72 mg/dL (ref 0.44–1.00)
GFR calc Af Amer: >60 mL/min (ref >60)
GFR calc non Af Amer: >60 mL/min (ref >60)
Glucose, Bld: 95 mg/dL (ref 70–99)
Potassium: 3.8 mmol/L (ref 3.5–5.1)
Sodium: 140 mmol/L (ref 135–145)

## 2019-02-03 LAB — TROPONIN I (HIGH SENSITIVITY)
Troponin I (High Sensitivity): 12 ng/L (ref <18)
Troponin I (High Sensitivity): 42 ng/L — ABNORMAL HIGH (ref <18)
Troponin I (High Sensitivity): 66 ng/L — ABNORMAL HIGH (ref <18)

## 2019-02-03 LAB — CBC
HCT: 41 % (ref 36.0–46.0)
Hemoglobin: 13.1 g/dL (ref 12.0–15.0)
MCH: 30.3 pg (ref 26.0–34.0)
MCHC: 32 g/dL (ref 30.0–36.0)
MCV: 94.7 fL (ref 80.0–100.0)
Platelets: 349 10*3/uL (ref 150–400)
RBC: 4.33 MIL/uL (ref 3.87–5.11)
RDW: 13.2 % (ref 11.5–15.5)
WBC: 6.4 10*3/uL (ref 4.0–10.5)
nRBC: 0 % (ref 0.0–0.2)

## 2019-02-03 LAB — SARS CORONAVIRUS 2 (TAT 6-24 HRS): SARS Coronavirus 2: NEGATIVE

## 2019-02-03 LAB — CK: Total CK: 44 U/L (ref 38–234)

## 2019-02-03 LAB — BRAIN NATRIURETIC PEPTIDE: B Natriuretic Peptide: 176 pg/mL — ABNORMAL HIGH (ref 0.0–100.0)

## 2019-02-03 MED ORDER — EZETIMIBE 10 MG PO TABS
10.0000 mg | ORAL_TABLET | Freq: Every day | ORAL | Status: DC
  Administered 2019-02-04: 10 mg via ORAL
  Filled 2019-02-03: qty 1

## 2019-02-03 MED ORDER — METOCLOPRAMIDE HCL 5 MG/ML IJ SOLN
10.0000 mg | Freq: Once | INTRAMUSCULAR | Status: AC
  Administered 2019-02-03: 10 mg via INTRAVENOUS
  Filled 2019-02-03: qty 2

## 2019-02-03 MED ORDER — SODIUM CHLORIDE 0.9% FLUSH: 3.0000 mL | Freq: Once | INTRAVENOUS | Status: DC

## 2019-02-03 MED ORDER — ROSUVASTATIN CALCIUM 5 MG PO TABS
2.5000 mg | ORAL_TABLET | Freq: Every day | ORAL | Status: DC
  Administered 2019-02-04: 10:00:00 2.5 mg via ORAL
  Filled 2019-02-03: qty 1

## 2019-02-03 MED ORDER — FUROSEMIDE 20 MG PO TABS
20.0000 mg | ORAL_TABLET | Freq: Once | ORAL | Status: AC
  Administered 2019-02-03: 20 mg via ORAL
  Filled 2019-02-03: qty 1

## 2019-02-03 MED ORDER — IOHEXOL 350 MG/ML SOLN
75.0000 mL | Freq: Once | INTRAVENOUS | Status: AC | PRN
  Administered 2019-02-03: 75 mL via INTRAVENOUS

## 2019-02-03 MED ORDER — PLECANATIDE 3 MG PO TABS: 3.0000 mg | ORAL_TABLET | Freq: Every day | ORAL | Status: DC | PRN

## 2019-02-03 MED ORDER — ALBUTEROL SULFATE (2.5 MG/3ML) 0.083% IN NEBU: 3.0000 mL | INHALATION_SOLUTION | RESPIRATORY_TRACT | Status: DC | PRN

## 2019-02-03 MED ORDER — LOSARTAN POTASSIUM 50 MG PO TABS
75.0000 mg | ORAL_TABLET | Freq: Every day | ORAL | Status: DC
  Administered 2019-02-03: 16:00:00 75 mg via ORAL
  Filled 2019-02-03 (×2): qty 2

## 2019-02-03 MED ORDER — LOSARTAN POTASSIUM 50 MG PO TABS: 75.0000 mg | ORAL_TABLET | Freq: Every day | ORAL | Status: DC

## 2019-02-03 MED ORDER — ACETAMINOPHEN 325 MG PO TABS: 650.0000 mg | ORAL_TABLET | Freq: Four times a day (QID) | ORAL | Status: DC | PRN

## 2019-02-03 MED ORDER — VITAMIN D 25 MCG (1000 UNIT) PO TABS: 1000.0000 [IU] | ORAL_TABLET | Freq: Every day | ORAL | Status: DC

## 2019-02-03 MED ORDER — HYDRALAZINE HCL 20 MG/ML IJ SOLN: 10.0000 mg | INTRAMUSCULAR | Status: DC | PRN

## 2019-02-03 MED ORDER — ONDANSETRON 4 MG PO TBDP
4.0000 mg | ORAL_TABLET | Freq: Once | ORAL | Status: AC
  Administered 2019-02-03: 4 mg via ORAL
  Filled 2019-02-03: qty 1

## 2019-02-03 MED ORDER — ENOXAPARIN SODIUM 40 MG/0.4ML ~~LOC~~ SOLN
40.0000 mg | SUBCUTANEOUS | Status: DC
  Administered 2019-02-04: 40 mg via SUBCUTANEOUS
  Filled 2019-02-03: qty 0.4

## 2019-02-03 MED ORDER — ACETAMINOPHEN 650 MG RE SUPP: 650.0000 mg | Freq: Four times a day (QID) | RECTAL | Status: DC | PRN

## 2019-02-03 MED ORDER — ONDANSETRON HCL 4 MG/2ML IJ SOLN: 4.0000 mg | Freq: Once | INTRAMUSCULAR | Status: DC

## 2019-02-03 MED ORDER — LORATADINE 10 MG PO TABS
10.0000 mg | ORAL_TABLET | Freq: Every day | ORAL | Status: DC
  Administered 2019-02-04: 10:00:00 10 mg via ORAL
  Filled 2019-02-03: qty 1

## 2019-02-03 MED ORDER — LABETALOL HCL 5 MG/ML IV SOLN: 10.0000 mg | Freq: Once | INTRAVENOUS | Status: DC

## 2019-02-03 NOTE — Telephone Encounter (Signed)
Left a message for the pt to return my call.  CRM also created.  

## 2019-02-03 NOTE — ED Provider Notes (Signed)
4:31 PM BP (!) 193/79   Pulse (!) 59   Temp 98.8 F (37.1 C)   Resp 17   SpO2 100%  Patient here with 1 month of sob She has been working on BP with her PCP, did not take any meds today If CTA is ok -admit for elevating trops  Resume lasix 20  Continue taking losartan   8:10 PM BP (!) 201/77   Pulse 61   Temp 98.8 F (37.1 C)   Resp 19   SpO2 97%  Patient continues to to be hypertensive, despite taking her home meds.  She has had chronic nausea here in the emergency department.  Her blood pressures are trending upward.  I have ordered IV labetalol.  Personally reviewed the patient's CT angiogram which shows no evidence of pulmonary embolus.  I have discussed the case with Dr. Marlowe Sax. Patient will be admitted for suspected hypertensive emergency vs acs  .Critical Care Performed by: Margarita Mail, PA-C Authorized by: Margarita Mail, PA-C   Critical care provider statement:    Critical care time (minutes):  30   Critical care was necessary to treat or prevent imminent or life-threatening deterioration of the following conditions:  Cardiac failure   Critical care was time spent personally by me on the following activities:  Discussions with consultants, evaluation of patient's response to treatment, examination of patient, ordering and performing treatments and interventions, ordering and review of laboratory studies, ordering and review of radiographic studies, pulse oximetry, re-evaluation of patient's condition, obtaining history from patient or surrogate and review of old charts       Margarita Mail, PA-C 02/03/19 2059    Davonna Belling, MD 02/03/19 2119

## 2019-02-03 NOTE — Telephone Encounter (Addendum)
Pt called stating her BP was 214/90 at 0810 02/03/2019; this was taken on her left upper arm; she says that she has been short of breath all night; she previously had shortness of breath intermittently her other readings are: 189/79 01/29/2019 161/87 AM 189/88 PM 01/30/2019 141/73 AM 170/78 PM 01/31/2019 167/84 AM 02/01/2019 161/94 AM 167/76 PM 02/02/2019; the pt says that she has not taken any lasix; she also has no swelling or weight gain; Recommendations made per nurse triage protocol; she verbalized understanding; the pt sees Dr Ethlyn Gallery, [facility]; will route to office for notification Reason for Disposition . [id number] Systolic BP  >= 0000000 OR Diastolic >= [id number] AND [id number] cardiac or neurologic symptoms (e.g., chest pain, difficulty breathing, unsteady gait, blurred vision)  Answer Assessment - Initial Assessment Questions 1. BLOOD PRESSURE: "What is the blood pressure?" "Did you take at least two measurements 5 minutes apart?"     214/90 and 167/76 2. ONSET: "When did you take your blood pressure?"    02/03/2019 0810 and  02/02/2019 PM 3. HOW: "How did you obtain the blood pressure?" (e.g., visiting nurse, automatic home BP monitor)   Home cuff on upper arm 4. HISTORY: "Do you have a history of high blood pressure?"     yes 5. MEDICATIONS: "Are you taking any medications for blood pressure?" "Have you missed any doses recently?"   The pt has not take this AM's dose 02/03/2019 6. OTHER SYMPTOMS: "Do you have any symptoms?" (e.g., headache, chest pain, blurred vision, difficulty breathing, weakness)  shortness of breath 7. PREGNANCY: "Is there any chance you are pregnant?" "When was your last menstrual period?"   no  Protocols used: HIGH BLOOD PRESSURE-A-AH

## 2019-02-03 NOTE — Telephone Encounter (Signed)
She went to ER. I spoke with margo, the PA taking care of her. I will await their final note. They were still awaiting some bloodwork results and were going to talk with her about possibly restarting the lasix at low dose or considering alternative medication.

## 2019-02-03 NOTE — H&P (Signed)
History and Physical    TAQUIA PIZZOLA [id number] DOB: November 16, 1933 DOA: 02/03/2019  PCP: [name], MD Patient coming from: Home  Chief Complaint: Shortness of breath, high blood pressure  HPI: Robin [name] is a 85 y.o. female with medical history significant of hypertension, hyperlipidemia, asthma, arthritis, GERD, hyperparathyroidism, IBS, colon polyps, osteoporosis, vertigo presenting to the hospital for evaluation of shortness of breath and high blood pressure.  Patient states her blood pressure has been high for very long time.  She was previously taking Lasix 20 mg daily and that the dose was increased by her doctor to 40 mg daily due to worsening swelling of her legs.  She stopped taking Lasix at the beginning of September because it was making her have body aches.  States she was previously on amlodipine which was stopped by her doctor due to swelling in her legs.  She was instead started on losartan 50 mg daily but continued to have high blood pressure and as such the dose was increased to 75 mg daily but again her blood pressure continues to be high.  Also reports having shortness of breath only at nighttime for the past 3 to 4 weeks.  States she feels short of breath whenever she gets up at night.  Does not feel short of breath when lying down flat.  She has been coughing a little.  Denies fevers, chills, or chest pain.  Reports feeling nauseous and having a slight headache since she has been in the emergency room for about a day and has not received anything to eat.  Denies abdominal pain, vomiting, or diarrhea.  ED Course: Systolic above [id number].  Remainder of vitals stable.  Renal function at baseline.  High-sensitivity troponin 12 >42.  EKG not suggestive of ACS.  BNP 176.  SARS-CoV-2 test pending.  Chest x-ray negative for acute finding.  CTA negative for PE. Patient received Lasix 20 mg oral, losartan 75 mg, and Zofran 4 mg in the ED.  Review of Systems:  All systems  reviewed and apart from history of presenting illness, are negative.  Past Medical History:  Diagnosis Date  . Allergy   . Arthritis   . Asthma   . Colon polyps    tubulovillous adenoma, 2010  . External hemorrhoids without mention of complication   . GERD (gastroesophageal reflux disease)   . Headache    eval with neuro in 2011  . Hyperlipemia   . Hyperparathyroidism [facility])    seeing Dr. Dwyane Dee in endocrinology  . Hypertension   . IBS (irritable bowel syndrome), GERD, hx colon polyps, chronic abd pain - followed by Dr. Sharlett Iles in GI 06/11/2012  . Osteoporosis 08/29/2011  . Vertigo    eval with neuro in 2011, brief recurrence 2016    Past Surgical History:  Procedure Laterality Date  . BACK SURGERY  1974  . CARPAL TUNNEL RELEASE Left 06/2008  . COLONOSCOPY    . EYE SURGERY  07/2017   bilateral cataract extraction  . POLYPECTOMY    . TONSILLECTOMY    . UPPER GASTROINTESTINAL ENDOSCOPY       reports that she has never smoked. She has never used smokeless tobacco. She reports that she does not drink alcohol or use drugs.  Allergies  Allergen Reactions  . Prilosec [Omeprazole] Cough  . Protonix [Pantoprazole Sodium] Swelling  . Amoxicillin Rash  . Metronidazole Rash    Family History  Problem Relation Age of Onset  . Diabetes Brother   . Hypertension  Brother   . Stroke Brother 29  . Hypertension Father   . Stroke Father   . Diabetes Sister   . Hypertension Sister   . Heart disease Sister        heart failure  . CAD Mother        Died of MI at age 57  . CAD Brother        Died of MI at age 38  . Stroke Sister 32  . Stroke Brother 38  . Colon cancer Neg Hx   . Hypercalcemia Neg Hx   . Colon polyps Neg Hx   . Esophageal cancer Neg Hx   . Stomach cancer Neg Hx   . Rectal cancer Neg Hx     Prior to Admission medications   Medication Sig Start Date End Date Taking? Authorizing Provider  AMBULATORY NON FORMULARY MEDICATION Medication Name: GI cocktail 5-10  ML every 4-6 hours as needed for pain 01/29/18   Levin Erp, PA  cetirizine (ZYRTEC) 10 MG tablet Take 10 mg by mouth daily.    [provider]  Cholecalciferol 25 MCG (1000 UT) TBDP Take 1,000 Units by mouth daily. 11/18/18   Renato Shin, MD  esomeprazole (NEXIUM) 40 MG capsule Take 1 capsule (40 mg total) by mouth 2 (two) times daily before a meal. Patient taking differently: Take 40 mg by mouth as needed.  09/06/18   [name], MD  ezetimibe (ZETIA) 10 MG tablet TAKE 1 BY MOUTH DAILY 10/21/18   [name], MD  furosemide (LASIX) 20 MG tablet Take 1 tablet (20 mg total) by mouth daily as needed. 01/15/19   [name], MD  losartan (COZAAR) 50 MG tablet Take 1.5 tablets (75 mg total) by mouth daily. 01/22/19 04/22/19  [name], [name], MD  Plecanatide (TRULANCE) 3 MG TABS Take 1 tablet by mouth daily. 12/18/18   [name], MD  rosuvastatin (CRESTOR) 5 MG tablet Take 1/2 tablet by mouth daily. 10/21/18   [name], MD    Physical Exam: Vitals:   02/03/19 1915 02/03/19 1930 02/03/19 2000 02/03/19 2015  BP: (!) 201/77 (!) 188/83  (!) 178/81  Pulse: 61 (!) 57 86   Resp: 19 13 16 14   Temp:      SpO2: 97% 96% 94%     Physical Exam  Constitutional: She is oriented to person, place, and time. She appears well-developed and well-nourished. No distress.  HENT:  Head: Normocephalic.  Mouth/Throat: Oropharynx is clear and moist.  Eyes: Pupils are equal, round, and reactive to light. EOM are normal.  Neck: Neck supple.  Cardiovascular: Normal rate, regular rhythm and intact distal pulses.  Pulmonary/Chest: Effort normal and breath sounds normal. No respiratory distress. She has no wheezes. She has no rales.  Abdominal: Soft. Bowel sounds are normal. She exhibits no distension. There is no abdominal tenderness. There is no rebound and no guarding.  Musculoskeletal:        General: No edema.  Neurological: She is alert and oriented to person,  place, and time.  Skin: Skin is warm and dry. She is not diaphoretic.     Labs on Admission: I have personally reviewed following labs and imaging studies  CBC: Recent Labs  Lab 02/03/19 0953  WBC 6.4  HGB 13.1  HCT 41.0  MCV 94.7  PLT 0000000   Basic Metabolic Panel: Recent Labs  Lab 02/03/19 0953  NA 140  K 3.8  CL 107  CO2 24  GLUCOSE 95  BUN 12  CREATININE 0.72  CALCIUM 10.6*   GFR: CrCl cannot be calculated (Unknown ideal weight.). Liver Function Tests: No results for input(s): AST, ALT, ALKPHOS, BILITOT, PROT, ALBUMIN in the last 168 hours. No results for input(s): LIPASE, AMYLASE in the last 168 hours. No results for input(s): AMMONIA in the last 168 hours. Coagulation Profile: No results for input(s): INR, PROTIME in the last 168 hours. Cardiac Enzymes: Recent Labs  Lab 02/03/19 1401  CKTOTAL 44   BNP (last 3 results) No results for input(s): PROBNP in the last 8760 hours. HbA1C: No results for input(s): HGBA1C in the last 72 hours. CBG: No results for input(s): GLUCAP in the last 168 hours. Lipid Profile: No results for input(s): CHOL, HDL, LDLCALC, TRIG, CHOLHDL, LDLDIRECT in the last 72 hours. Thyroid Function Tests: No results for input(s): TSH, T4TOTAL, FREET4, T3FREE, THYROIDAB in the last 72 hours. Anemia Panel: No results for input(s): VITAMINB12, FOLATE, FERRITIN, TIBC, IRON, RETICCTPCT in the last 72 hours. Urine analysis:    Component Value Date/Time   COLORURINE YELLOW 04/01/2015 2037   APPEARANCEUR CLEAR 04/01/2015 2037   LABSPEC 1.020 04/01/2015 2037   PHURINE 6.0 04/01/2015 2037   GLUCOSEU NEGATIVE 04/01/2015 2037   HGBUR NEGATIVE 04/01/2015 2037   BILIRUBINUR NEGATIVE 04/01/2015 2037   BILIRUBINUR neg 01/14/2013 1622   KETONESUR 40 (A) 04/01/2015 2037   PROTEINUR NEGATIVE 04/01/2015 2037   UROBILINOGEN 0.2 01/14/2013 1622   UROBILINOGEN 0.2 01/25/2010 1334   NITRITE NEGATIVE 04/01/2015 2037   LEUKOCYTESUR NEGATIVE 04/01/2015  2037    Radiological Exams on Admission: Dg Chest 2 View  Result Date: 02/03/2019 CLINICAL DATA:  Shortness of breath EXAM: CHEST - 2 VIEW COMPARISON:  07/15/2018 FINDINGS: The heart size and mediastinal contours are within normal limits. Aortic atherosclerosis. Both lungs are clear. Disc degenerative disease of the thoracic spine. IMPRESSION: No acute abnormality of the lungs. Electronically Signed   By: Eddie Candle M.D.   On: 02/03/2019 10:23   Ct Angio Chest Pe W/cm &/or Wo Cm  Result Date: 02/03/2019 CLINICAL DATA:  Hypertension, shortness of breath. EXAM: CT ANGIOGRAPHY CHEST WITH CONTRAST TECHNIQUE: Multidetector CT imaging of the chest was performed using the standard protocol during bolus administration of intravenous contrast. Multiplanar CT image reconstructions and MIPs were obtained to evaluate the vascular anatomy. CONTRAST:  10mL OMNIPAQUE IOHEXOL 350 MG/ML SOLN COMPARISON:  None. FINDINGS: Cardiovascular: No filling defects in the pulmonary arteries to suggest pulmonary emboli. Heart is normal size. Aorta is calcified and tortuous, nonaneurysmal. Mediastinum/Nodes: No mediastinal, hilar, or axillary adenopathy. Trachea and esophagus are unremarkable. Small hiatal hernia. Thyroid unremarkable. Lungs/Pleura: Lungs are clear. No focal airspace opacities or suspicious nodules. No effusions. Upper Abdomen: Imaging into the upper abdomen shows no acute findings. Musculoskeletal: Chest wall soft tissues are unremarkable. No acute bony abnormality. Review of the MIP images confirms the above findings. IMPRESSION: Negative for pulmonary embolus. Coronary artery disease, aortic atherosclerosis. Small hiatal hernia. Electronically Signed   By: Rolm Baptise M.D.   On: 02/03/2019 19:14    EKG: Independently reviewed.  Sinus rhythm, LAFB.  Assessment/Plan Principal Problem:   Hypertensive urgency Active Problems:   HYPERCHOLESTEROLEMIA   Dyspnea   Asthma   Hypertensive urgency Likely  secondary to home antihypertensive nonadherence.  Patient stopped taking Lasix earlier this month.  Systolic as high as above 200. High-sensitivity troponin 12 >42.  EKG not suggestive of ACS.  Patient is not complaining of chest pain and appears comfortable.  Suspect  mild troponin elevation is secondary to demand ischemia.  Chest x-ray and CT angiogram not suggestive of acute CHF or aortic dissection. -Monitor blood pressure closely -IV hydralazine PRN SBP greater than 160 -Continue home losartan -Continue to trend troponin -Will need optimization of home antihypertensive medication regimen prior to discharge.  Subacute dyspnea Patient reports 3 to 4-week history of dyspnea only at night.  Does not endorse orthopnea.  BNP not significantly elevated.  Does not appear volume verloaded on exam.  CT angiogram not suggestive of acute CHF or PE.  Not tachypneic or hypoxic.  No increased work of breathing. -Continue to monitor -Continuous pulse ox  Hyperlipidemia -Continue home Zetia, Crestor  Asthma -Stable.  Albuterol PRN.  DVT prophylaxis: Lovenox Code Status: Patient wishes to be full code. Family Communication: Daughter at bedside. Disposition Plan: Anticipate discharge in 1 to 2 days. Consults called: None Admission status: It is my clinical opinion that referral for OBSERVATION is reasonable and necessary in this patient based on the above information provided. The aforementioned taken together are felt to place the patient at high risk for further clinical deterioration. However it is anticipated that the patient may be medically stable for discharge from the hospital within 24 to 48 hours.  The medical decision making on this patient was of high complexity and the patient is at high risk for clinical deterioration, therefore this is a level 3 visit.  Shela Leff MD [facility] Pager [phone]  If 7PM-7AM, please contact night-coverage [email or web address] Password [facility]   02/03/2019, 9:18 PM

## 2019-02-03 NOTE — ED Provider Notes (Cosign Needed)
[facility] Provider Note   CSN: [id number] Arrival date & time: 02/03/19  0944     History   Chief Complaint Chief Complaint  Patient presents with  . Shortness of Breath  . Hypertension    HPI KEYSHLA LAVALLE is a 85 y.o. female with PMHx HTN, HLD, Hyperparathyroidism, GERD who presents to the ED today complaining of gradual onset, constant, shortness of breath x 1 month worsening last night.  Reports she has felt short of breath after her blood pressure medications were changed.  She states that she was initially on amlodipine but there was concern that she was retaining some fluid with it so her cardiologist changed her from amlodipine to 50 mg losartan.  It appears that the losartan is not controlling her blood pressure adequately so she was increased to 75 mg losartan.  She was also recently taken off of her Lasix per her request.  She had concerned that it was causing her to have body aches and so she asked her PCP to discontinue this.  She had been increased to 60 mg by her endocrinologist.  She reports that her blood pressures have continued to stay high despite 75 mg losartan.  She reports her blood pressure was 214/96 this a.m.  Reports this along with her worsening shortness of breath concerned her so she came to the ED for further evaluation.  Denies fever, chills, cough, chest pain, unilateral leg swelling, abdominal pain, nausea, vomiting, any other associated symptoms.   Daughter is at bedside with patient.  She reports that patient has not tolerated a multitude of antihypertensives in the past.        Past Medical History:  Diagnosis Date  . Allergy   . Arthritis   . Asthma   . Colon polyps    tubulovillous adenoma, 2010  . External hemorrhoids without mention of complication   . GERD (gastroesophageal reflux disease)   . Headache    eval with neuro in 2011  . Hyperlipemia   . Hyperparathyroidism [facility])    seeing Dr. Dwyane Dee  in endocrinology  . Hypertension   . IBS (irritable bowel syndrome), GERD, hx colon polyps, chronic abd pain - followed by Dr. Sharlett Iles in GI 06/11/2012  . Osteoporosis 08/29/2011  . Vertigo    eval with neuro in 2011, brief recurrence 2016    Patient Active Problem List   Diagnosis Date Noted  . Cyst of left ovary 07/04/2016  . Hyperparathyroidism ([place]) 08/24/2014  . Slow transit constipation 01/13/2014  . IBS (irritable bowel syndrome), GERD, hx colon polyps, chronic abd pain - followed by Dr. Sharlett Iles in GI 06/11/2012  . Osteopenia 08/29/2011  . Vitamin D deficiency 08/29/2011  . GERD (gastroesophageal reflux disease) 11/23/2010  . HYPERCHOLESTEROLEMIA 09/09/2007  . Essential hypertension 09/09/2007    Past Surgical History:  Procedure Laterality Date  . BACK SURGERY  1974  . CARPAL TUNNEL RELEASE Left 06/2008  . COLONOSCOPY    . EYE SURGERY  07/2017   bilateral cataract extraction  . POLYPECTOMY    . TONSILLECTOMY    . UPPER GASTROINTESTINAL ENDOSCOPY       OB History    Gravida  2   Para  2   Term  2   Preterm      AB      Living  2     SAB      TAB      Ectopic      Multiple  Live Births  2            Home Medications    Prior to Admission medications   Medication Sig Start Date End Date Taking? Authorizing Provider  AMBULATORY NON FORMULARY MEDICATION Medication Name: GI cocktail 5-10 ML every 4-6 hours as needed for pain 01/29/18   Levin Erp, PA  cetirizine (ZYRTEC) 10 MG tablet Take 10 mg by mouth daily.    [provider]  Cholecalciferol 25 MCG (1000 UT) TBDP Take 1,000 Units by mouth daily. 11/18/18   Renato Shin, MD  esomeprazole (NEXIUM) 40 MG capsule Take 1 capsule (40 mg total) by mouth 2 (two) times daily before a meal. Patient taking differently: Take 40 mg by mouth as needed.  09/06/18   [name], MD  ezetimibe (ZETIA) 10 MG tablet TAKE 1 BY MOUTH DAILY 10/21/18   [name], MD   furosemide (LASIX) 20 MG tablet Take 1 tablet (20 mg total) by mouth daily as needed. 01/15/19   [name], MD  losartan (COZAAR) 50 MG tablet Take 1.5 tablets (75 mg total) by mouth daily. 01/22/19 04/22/19  [name], [name], MD  Plecanatide (TRULANCE) 3 MG TABS Take 1 tablet by mouth daily. 12/18/18   [name], MD  rosuvastatin (CRESTOR) 5 MG tablet Take 1/2 tablet by mouth daily. 10/21/18   [name], MD    Family History Family History  Problem Relation Age of Onset  . Diabetes Brother   . Hypertension Brother   . Stroke Brother 57  . Hypertension Father   . Stroke Father   . Diabetes Sister   . Hypertension Sister   . Heart disease Sister        heart failure  . CAD Mother        Died of MI at age 31  . CAD Brother        Died of MI at age 96  . Stroke Sister 27  . Stroke Brother 64  . Colon cancer Neg Hx   . Hypercalcemia Neg Hx   . Colon polyps Neg Hx   . Esophageal cancer Neg Hx   . Stomach cancer Neg Hx   . Rectal cancer Neg Hx     Social History Social History   Tobacco Use  . Smoking status: Never Smoker  . Smokeless tobacco: Never Used  Substance Use Topics  . Alcohol use: No    Alcohol/week: 0.0 standard drinks  . Drug use: No     Allergies   Prilosec [omeprazole], Amoxicillin, Metronidazole, and Protonix [pantoprazole sodium]   Review of Systems Review of Systems  Constitutional: Negative for chills and fever.  HENT: Negative for congestion.   Eyes: Negative for visual disturbance.  Respiratory: Positive for shortness of breath. Negative for cough.   Cardiovascular: Negative for chest pain and leg swelling.  Gastrointestinal: Negative for abdominal pain, nausea and vomiting.  Genitourinary: Negative for difficulty urinating.  Musculoskeletal: Negative for myalgias.  Skin: Negative for rash.  Neurological: Negative for headaches.     Physical Exam Updated Vital Signs BP (!) 195/91 (BP Location: Right Arm)   Pulse  75   Temp 98.8 F (37.1 C)   Resp 20   SpO2 99%   Physical Exam Vitals signs and nursing note reviewed.  Constitutional:      Appearance: She is not ill-appearing.  HENT:     Head: Normocephalic and atraumatic.  Eyes:     Conjunctiva/sclera: Conjunctivae normal.  Neck:     Musculoskeletal: Neck supple.  Cardiovascular:     Rate and Rhythm: Normal rate and regular rhythm.     Pulses: Normal pulses.  Pulmonary:     Effort: Pulmonary effort is normal.     Breath sounds: Normal breath sounds. No decreased breath sounds, wheezing, rhonchi or rales.  Abdominal:     Palpations: Abdomen is soft.     Tenderness: There is no abdominal tenderness.  Musculoskeletal:     Right lower leg: Edema present.     Left lower leg: Edema present.     Comments: Nonpitting edema bilaterally  Skin:    General: Skin is warm and dry.  Neurological:     Mental Status: She is alert.      ED Treatments / Results  Labs (all labs ordered are listed, but only abnormal results are displayed) Labs Reviewed  BASIC METABOLIC PANEL - Abnormal; Notable for the following components:      Result Value   Calcium 10.6 (*)    All other components within normal limits  BRAIN NATRIURETIC PEPTIDE - Abnormal; Notable for the following components:   B Natriuretic Peptide 176.0 (*)    All other components within normal limits  TROPONIN I (HIGH SENSITIVITY) - Abnormal; Notable for the following components:   Troponin I (High Sensitivity) 42 (*)    All other components within normal limits  CBC  CK  TROPONIN I (HIGH SENSITIVITY)    EKG EKG Interpretation  Date/Time:  Monday February 03 2019 09:53:59 EDT Ventricular Rate:  77 PR Interval:  178 QRS Duration: 84 QT Interval:  364 QTC Calculation: 411 R Axis:   -48 Text Interpretation:  Normal sinus rhythm Left anterior fascicular block Left ventricular hypertrophy Abnormal ECG Confirmed by Gerlene Fee [id number]) on 02/03/2019 12:24:15 PM   Radiology Dg  Chest 2 View  Result Date: 02/03/2019 CLINICAL DATA:  Shortness of breath EXAM: CHEST - 2 VIEW COMPARISON:  07/15/2018 FINDINGS: The heart size and mediastinal contours are within normal limits. Aortic atherosclerosis. Both lungs are clear. Disc degenerative disease of the thoracic spine. IMPRESSION: No acute abnormality of the lungs. Electronically Signed   By: Eddie Candle M.D.   On: 02/03/2019 10:23    Procedures Procedures (including critical care time)  Medications Ordered in ED Medications  sodium chloride flush (NS) 0.9 % injection 3 mL (has no administration in time range)  losartan (COZAAR) tablet 75 mg (75 mg Oral Given 02/03/19 1626)  furosemide (LASIX) tablet 20 mg (20 mg Oral Given 02/03/19 1627)     Initial Impression / Assessment and Plan / ED Course  I have reviewed the triage vital signs and the nursing notes.  Pertinent labs & imaging results that were available during my care of the patient were reviewed by me and considered in my medical decision making (see chart for details).  Clinical Course as of Feb 03 1652  Mon Feb 03, 2019  1512 B Natriuretic Peptide(!): 176.0 [MV]    Clinical Course User Index [MV] Eustaquio Maize, [place]   85 year old female who presents today with complaints of shortness of breath x1 month, worsening last night with difficulties controlling her blood pressure.  Recently was taken off of amlodipine with concern for fluid retention and changed to 50 mg losartan.  This was then again increased to 75 mg losartan.  Patient was also taken off of Lasix per her request.  She did not want to be on it any longer after being increased to  60 mg and reported worsening body aches.  She appears very comfortable on exam.  She is speaking in full sentences and satting 99% on room air without accessory muscle use.  Her blood pressure on arrival was 195/91.  On repeat 165/96.    Blood Work obtained prior to being seen.  X-ray clear without any signs of fluid  overload.  No leukocytosis.  Hemoglobin stable.  No electrolyte abnormalities.  Troponin of 12.  Will repeat.  CK and BNP added on.   I have spoken directly with patient's PCP Dr. Ethlyn Gallery.  She states that she was very hesitant to take patient off of her Lasix but was willing to give a trial run.  She thinks that patient may benefit from even 20 mg Lasix daily to help control blood pressure and feelings of shortness of breath.  Also discussed may be putting patient back on a very small dose of amlodipine versus trying another blood pressure medication although it does appear that patient has had intolerances to multiple antihypertensive categories.  Will discuss options with patient prior to discharge.  She may need to follow-up with cardiology as well for further management of her blood pressure.   BNP slightly elevated at 176 although no findings of fluid overload on chest x-ray.  CK within normal limits.  Repeat troponin elevated at 42.  Was initially discussion between myself and attending physician Dr. Maurie Boettcher regarding obtaining d-dimer as we were very low suspicion for PE today.  Given elevation in troponin will proceed with CTA at this time.  Evaluation patient's blood pressure in the low 123456 systolic.  She has not taken her losartan today.  Will give 75 mg losartan.  Discussed addition of even 20 mg of Lasix with patient today given she was recently taken off.  She is in agreement to try 20 mg but does not want to go any further.  Will add 20 mg Lasix to losartan in the ED today.  Advised to start this regimen outpatient tomorrow and to follow-up with Dr. [name] with cardiology.    4:52 PM At shift change case signed out to Margarita Mail, PA-C, who will dispo patient accordingly after CTA returns. Pt may need to be admitted given elevation in troponin.         Final Clinical Impressions(s) / ED Diagnoses   Final diagnoses:  Essential hypertension  Shortness of breath    ED  Discharge Orders    None       Eustaquio Maize, [place] 02/03/19 1653

## 2019-02-03 NOTE — Telephone Encounter (Signed)
Please have her recheck her blood pressure. If still elevated in 200's she needs to go to ER for evaluation. If bp has improved, we may be able to talk through symptoms and try to manage.   I am concerned if she is still feeling short of breath. Please see if she has taken any lasix and confirm which bp medications she has taken today.

## 2019-02-03 NOTE — ED Triage Notes (Signed)
Pt reports ongoing HTN and sob for the past month, states her pcp has been tweaking her bp meds but her bp Is still running high. States sob was worse last night and she was up almost all night because of it. Denies chest pain, fever or chills.

## 2019-02-04 ENCOUNTER — Observation Stay (HOSPITAL_BASED_OUTPATIENT_CLINIC_OR_DEPARTMENT_OTHER): Payer: Medicare Other

## 2019-02-04 DIAGNOSIS — I160 Hypertensive urgency: Principal | ICD-10-CM

## 2019-02-04 DIAGNOSIS — R0602 Shortness of breath: Secondary | ICD-10-CM

## 2019-02-04 LAB — ECHOCARDIOGRAM COMPLETE
Height: 62 in
Weight: 2556.81 oz

## 2019-02-04 LAB — TROPONIN I (HIGH SENSITIVITY): Troponin I (High Sensitivity): 50 ng/L — ABNORMAL HIGH (ref <18)

## 2019-02-04 MED ORDER — LOSARTAN POTASSIUM 100 MG PO TABS: 100.0000 mg | ORAL_TABLET | Freq: Every day | ORAL | 0 refills | Status: DC

## 2019-02-04 MED ORDER — HYDROCHLOROTHIAZIDE 25 MG PO TABS: 25.0000 mg | ORAL_TABLET | Freq: Every day | ORAL | 0 refills | Status: DC

## 2019-02-04 MED ORDER — VITAMIN D 25 MCG (1000 UNIT) PO TABS
1000.0000 [IU] | ORAL_TABLET | Freq: Every day | ORAL | Status: DC
  Administered 2019-02-04: 1000 [IU] via ORAL

## 2019-02-04 MED ORDER — LOSARTAN POTASSIUM 50 MG PO TABS
100.0000 mg | ORAL_TABLET | Freq: Every day | ORAL | Status: DC
  Administered 2019-02-04: 100 mg via ORAL
  Filled 2019-02-04: qty 2

## 2019-02-04 MED ORDER — VITAMIN D 25 MCG (1000 UNIT) PO TABS
1000.0000 [IU] | ORAL_TABLET | Freq: Every day | ORAL | Status: DC
  Filled 2019-02-04: qty 1

## 2019-02-04 MED ORDER — HYDROCHLOROTHIAZIDE 25 MG PO TABS
25.0000 mg | ORAL_TABLET | Freq: Every day | ORAL | Status: DC
  Administered 2019-02-04: 25 mg via ORAL
  Filled 2019-02-04: qty 1

## 2019-02-04 NOTE — ED Notes (Signed)
Pt is sinus brady to NSR on monitor

## 2019-02-04 NOTE — ED Notes (Signed)
Discharge instructions and prescriptions discussed with Pt. Pt verbalized understanding. Pt stable and leaving via WC. 

## 2019-02-04 NOTE — Progress Notes (Signed)
  Echocardiogram 2D Echocardiogram has been performed.  Robin [name] L Androw 02/04/2019, 12:04 PM

## 2019-02-04 NOTE — Discharge Summary (Signed)
Physician Discharge Summary  Robin [name] [id number] DOB: December 17, 1933 DOA: 02/03/2019  PCP: [name], MD  Admit date: 02/03/2019 Discharge date: 02/04/2019  Admitted From: home  Disposition:  Home   Recommendations for Outpatient Follow-up:  1. Follow up with PCP in 1-2 weeks 2. Referred to cardiology follow-up, office will call for follow-up.  Home Health: Not applicable Equipment/Devices: Not applicable  Discharge Condition: Stable CODE STATUS: Full code Diet recommendation: Low-salt diet  Discharge summary: 85 year old female with history of hypertension, hyperlipidemia, GERD, hyperparathyroidism who was recently having difficulty controlling her blood pressure, she was tried on different medications, was also having some exertional dyspnea for long time came to the emergency room on request from her primary care physician for evaluation with blood pressure of more than 200.  In the emergency room, on arrival blood pressure 205/90.  No other complaints including chest pain or shortness of breath.  Hypertensive urgency: Having issues with blood pressure control for few months now.  She has been on losartan 50 mg and was recently increased to 75 mg with not much response.  Previously started on Lasix and did not tolerate.  Overall uncomplicated hypertension. Losartan increased 200 mg daily, hydrochlorothiazide 25 mg added.  With no additional medication her blood pressure is 165/78 and she is asymptomatic.  Low-salt diet.  This will probably control her blood pressure, patient and daughter educated to keep a logbook of blood pressures at home and bring it to doctor's appointment for further adjustment on antihypertensives.  With patient's concern about exertional dyspnea, underwent CTA of the chest that was negative for PE.  A 2D echocardiogram was done that showed grade 1 diastolic dysfunction, normal ejection fraction.  Patient has no evidence of congestive heart failure.   No wall motion abnormalities. Patient has minimally elevated high sensitive troponin, they remained flat, no ischemic EKG changes, no chest pain and echocardiogram with no acute changes.  Acute coronary syndrome ruled out. With all above conditions, I called patient's cardiology office and asked him to make an appointment for hospital follow-up. Patient remains fairly asymptomatic, she was discharged home with her daughter.  Discharge Diagnoses:  Principal Problem:   Hypertensive urgency Active Problems:   HYPERCHOLESTEROLEMIA   Dyspnea   Asthma    Discharge Instructions  Discharge Instructions    Diet - low sodium heart healthy   Complete by: As directed    Discharge instructions   Complete by: As directed    There are changes in medicine for you . Please take new prescriptions  Keep records of BP at home and bring it to follow up.   Increase activity slowly   Complete by: As directed      Allergies as of 02/04/2019      Reactions   Prilosec [omeprazole] Cough   Protonix [pantoprazole Sodium] Swelling   Amoxicillin Rash   Metronidazole Rash      Medication List    STOP taking these medications   furosemide 20 MG tablet Commonly known as: LASIX   lisinopril 40 MG tablet Commonly known as: ZESTRIL     TAKE these medications   acetaminophen 500 MG tablet Commonly known as: TYLENOL Take 500-1,000 mg by mouth every 6 (six) hours as needed for mild pain or headache.   AMBULATORY NON FORMULARY MEDICATION Medication Name: GI cocktail 5-10 ML every 4-6 hours as needed for pain What changed:   how much to take  how to take this  when to take this  additional instructions  cetirizine 10 MG tablet Commonly known as: ZYRTEC Take 10 mg by mouth daily.   Cholecalciferol 25 MCG (1000 UT) Tbdp Take 1,000 Units by mouth daily.   esomeprazole 40 MG capsule Commonly known as: NEXIUM Take 1 capsule (40 mg total) by mouth 2 (two) times daily before a meal. What  changed:   when to take this  reasons to take this   ezetimibe 10 MG tablet Commonly known as: Zetia TAKE 1 BY MOUTH DAILY What changed:   how much to take  how to take this  when to take this  additional instructions   hydrochlorothiazide 25 MG tablet Commonly known as: HYDRODIURIL Take 1 tablet (25 mg total) by mouth daily. Start taking on: February 05, 2019   losartan 100 MG tablet Commonly known as: COZAAR Take 1 tablet (100 mg total) by mouth daily. What changed:   medication strength  how much to take   rosuvastatin 5 MG tablet Commonly known as: Crestor Take 1/2 tablet by mouth daily. What changed:   how much to take  how to take this  when to take this  additional instructions   Trulance 3 MG Tabs Generic drug: Plecanatide Take 1 tablet by mouth daily. What changed:   how much to take  when to take this  reasons to take this       Allergies  Allergen Reactions  . Prilosec [Omeprazole] Cough  . Protonix [Pantoprazole Sodium] Swelling  . Amoxicillin Rash  . Metronidazole Rash    Consultations:  None    Procedures/Studies: Dg Chest 2 View  Result Date: 02/03/2019 CLINICAL DATA:  Shortness of breath EXAM: CHEST - 2 VIEW COMPARISON:  07/15/2018 FINDINGS: The heart size and mediastinal contours are within normal limits. Aortic atherosclerosis. Both lungs are clear. Disc degenerative disease of the thoracic spine. IMPRESSION: No acute abnormality of the lungs. Electronically Signed   By: Eddie Candle M.D.   On: 02/03/2019 10:23   Ct Angio Chest Pe W/cm &/or Wo Cm  Result Date: 02/03/2019 CLINICAL DATA:  Hypertension, shortness of breath. EXAM: CT ANGIOGRAPHY CHEST WITH CONTRAST TECHNIQUE: Multidetector CT imaging of the chest was performed using the standard protocol during bolus administration of intravenous contrast. Multiplanar CT image reconstructions and MIPs were obtained to evaluate the vascular anatomy. CONTRAST:  23mL  OMNIPAQUE IOHEXOL 350 MG/ML SOLN COMPARISON:  None. FINDINGS: Cardiovascular: No filling defects in the pulmonary arteries to suggest pulmonary emboli. Heart is normal size. Aorta is calcified and tortuous, nonaneurysmal. Mediastinum/Nodes: No mediastinal, hilar, or axillary adenopathy. Trachea and esophagus are unremarkable. Small hiatal hernia. Thyroid unremarkable. Lungs/Pleura: Lungs are clear. No focal airspace opacities or suspicious nodules. No effusions. Upper Abdomen: Imaging into the upper abdomen shows no acute findings. Musculoskeletal: Chest wall soft tissues are unremarkable. No acute bony abnormality. Review of the MIP images confirms the above findings. IMPRESSION: Negative for pulmonary embolus. Coronary artery disease, aortic atherosclerosis. Small hiatal hernia. Electronically Signed   By: Rolm Baptise M.D.   On: 02/03/2019 19:14   Mm 3d Screen Breast Bilateral  Result Date: 01/13/2019 CLINICAL DATA:  Screening. EXAM: DIGITAL SCREENING BILATERAL MAMMOGRAM WITH TOMO AND CAD COMPARISON:  Previous exam(s). ACR Breast Density Category b: There are scattered areas of fibroglandular density. FINDINGS: There are no findings suspicious for malignancy. Images were processed with CAD. IMPRESSION: No mammographic evidence of malignancy. A result letter of this screening mammogram will be mailed directly to the patient. RECOMMENDATION: Screening mammogram in one year. (Code:SM-B-01Y) BI-RADS CATEGORY  1:  Negative. Electronically Signed   By: Claudie Revering M.D.   On: 01/13/2019 17:33      Subjective: Patient seen and examined.  She was still in the emergency room waiting for bed availability on the floor.  No overnight events.  Daughter at the bedside.  Denies any chest pain or shortness of breath.  Ambulating to the bathroom.  Blood pressures mostly less than 180 after getting morning medications. Results of CTA and 2D echocardiogram discussed with patient and daughter.   Discharge Exam: Vitals:    02/04/19 1430 02/04/19 1515  BP: (!) 154/67 (!) 162/70  Pulse: 69 67  Resp: 14 16  Temp:    SpO2: 93% 92%   Vitals:   02/04/19 1014 02/04/19 1215 02/04/19 1430 02/04/19 1515  BP: (!) 161/62 (!) 184/73 (!) 154/67 (!) 162/70  Pulse: 73 65 69 67  Resp: 16 16 14 16   Temp:      SpO2: 94% 96% 93% 92%  Weight:      Height:        General: Pt is alert, awake, not in acute distress Cardiovascular: RRR, S1/S2 +, no rubs, no gallops Respiratory: CTA bilaterally, no wheezing, no rhonchi Abdominal: Soft, NT, ND, bowel sounds + Extremities: no edema, no cyanosis    The results of significant diagnostics from this hospitalization (including imaging, microbiology, ancillary and laboratory) are listed below for reference.     Microbiology: Recent Results (from the past 240 hour(s))  SARS CORONAVIRUS 2 (TAT 6-24 HRS) Nasopharyngeal Nasopharyngeal Swab     Status: None   Collection Time: 02/03/19  5:05 PM   Specimen: Nasopharyngeal Swab  Result Value Ref Range Status   SARS Coronavirus 2 NEGATIVE NEGATIVE Final    Comment: (NOTE) SARS-CoV-2 target nucleic acids are NOT DETECTED. The SARS-CoV-2 RNA is generally detectable in upper and lower respiratory specimens during the acute phase of infection. Negative results do not preclude SARS-CoV-2 infection, do not rule out co-infections with other pathogens, and should not be used as the sole basis for treatment or other patient management decisions. Negative results must be combined with clinical observations, patient history, and epidemiological information. The expected result is Negative. Fact Sheet for Patients: SugarRoll.be Fact Sheet for Healthcare Providers: https://www.woods-mathews.com/ This test is not yet approved or cleared by the [place] FDA and  has been authorized for detection and/or diagnosis of SARS-CoV-2 by FDA under an Emergency Use Authorization (EUA). This EUA will  remain  in effect (meaning this test can be used) for the duration of the COVID-19 declaration under Section 56 4(b)(1) of the Act, 21 U.S.C. section 360bbb-3(b)(1), unless the authorization is terminated or revoked sooner. Performed at [facility], [place] [address] [place], [place] [address]      Labs: BNP (last 3 results) Recent Labs    02/03/19 1400  BNP [id number]   Basic Metabolic Panel: Recent Labs  Lab 02/03/19 0953  NA 140  K 3.8  CL 107  CO2 24  GLUCOSE 95  BUN 12  CREATININE 0.72  CALCIUM 10.6*   Liver Function Tests: No results for input(s): AST, ALT, ALKPHOS, BILITOT, PROT, ALBUMIN in the last 168 hours. No results for input(s): LIPASE, AMYLASE in the last 168 hours. No results for input(s): AMMONIA in the last 168 hours. CBC: Recent Labs  Lab 02/03/19 0953  WBC 6.4  HGB 13.1  HCT 41.0  MCV 94.7  PLT 349   Cardiac Enzymes: Recent Labs  Lab 02/03/19 1401  CKTOTAL 44  BNP: Invalid input(s): POCBNP CBG: No results for input(s): GLUCAP in the last 168 hours. D-Dimer No results for input(s): DDIMER in the last 72 hours. Hgb A1c No results for input(s): HGBA1C in the last 72 hours. Lipid Profile No results for input(s): CHOL, HDL, LDLCALC, TRIG, CHOLHDL, LDLDIRECT in the last 72 hours. Thyroid function studies No results for input(s): TSH, T4TOTAL, T3FREE, THYROIDAB in the last 72 hours.  Invalid input(s): FREET3 Anemia work up No results for input(s): VITAMINB12, FOLATE, FERRITIN, TIBC, IRON, RETICCTPCT in the last 72 hours. Urinalysis    Component Value Date/Time   COLORURINE YELLOW 04/01/2015 2037   APPEARANCEUR CLEAR 04/01/2015 2037   LABSPEC 1.020 04/01/2015 2037   PHURINE 6.0 04/01/2015 2037   GLUCOSEU NEGATIVE 04/01/2015 2037   HGBUR NEGATIVE 04/01/2015 2037   BILIRUBINUR NEGATIVE 04/01/2015 2037   BILIRUBINUR neg 01/14/2013 1622   KETONESUR 40 (A) 04/01/2015 2037   PROTEINUR NEGATIVE 04/01/2015 2037   UROBILINOGEN 0.2  01/14/2013 1622   UROBILINOGEN 0.2 01/25/2010 1334   NITRITE NEGATIVE 04/01/2015 2037   LEUKOCYTESUR NEGATIVE 04/01/2015 2037   Sepsis Labs Invalid input(s): PROCALCITONIN,  WBC,  LACTICIDVEN Microbiology Recent Results (from the past 240 hour(s))  SARS CORONAVIRUS 2 (TAT 6-24 HRS) Nasopharyngeal Nasopharyngeal Swab     Status: None   Collection Time: 02/03/19  5:05 PM   Specimen: Nasopharyngeal Swab  Result Value Ref Range Status   SARS Coronavirus 2 NEGATIVE NEGATIVE Final    Comment: (NOTE) SARS-CoV-2 target nucleic acids are NOT DETECTED. The SARS-CoV-2 RNA is generally detectable in upper and lower respiratory specimens during the acute phase of infection. Negative results do not preclude SARS-CoV-2 infection, do not rule out co-infections with other pathogens, and should not be used as the sole basis for treatment or other patient management decisions. Negative results must be combined with clinical observations, patient history, and epidemiological information. The expected result is Negative. Fact Sheet for Patients: SugarRoll.be Fact Sheet for Healthcare Providers: https://www.woods-mathews.com/ This test is not yet approved or cleared by the [place] FDA and  has been authorized for detection and/or diagnosis of SARS-CoV-2 by FDA under an Emergency Use Authorization (EUA). This EUA will remain  in effect (meaning this test can be used) for the duration of the COVID-19 declaration under Section 56 4(b)(1) of the Act, 21 U.S.C. section 360bbb-3(b)(1), unless the authorization is terminated or revoked sooner. Performed at [facility], [place] [address], [place], [place] [address]      Time coordinating discharge:  32 minutes  SIGNED:   Barb Merino, MD  [facility] 02/04/2019, 4:05 PM

## 2019-02-11 NOTE — Progress Notes (Signed)
Virtual Visit via Telephone Note  I connected with Robin [name]  on 02/13/19 at  1:30 PM EDT by telephone and verified that I am speaking with the correct person using two identifiers.   I discussed the limitations, risks, security and privacy concerns of performing an evaluation and management service by telephone and the availability of in person appointments. I also discussed with the patient that there may be a patient responsible charge related to this service. The patient expressed understanding and agreed to proceed.  Location patient: home Location provider: work office Participants present for the call: patient, provider Patient did not have a visit in the prior 7 days to address this/these issue(s).   History of Present Illness: In hospital 9/21-9/22 due to hypertensive urgency. Losartan while in hospital was increased to 100mg  daily (from 75mg ) and hctz 25mg  was added. CTA was negative for PE. 2D echo showed grade 1 diastolic dysfunction, normal EF. Troponins were followed since elevated but EKG was stable. Cardiology follow up 10/6.   States was doing well and felt that bp was coming down, but now states that head is "killing her" and feeling dizzy. Then this morning took bp and it was 151/84. Recheck this afternoon was 180/82. Retook when on phone with Mechele Claude was 202/108 then 178/79 right before call. Changed batteries in machine. Thought reason it was so high was do to head hurting so badly. Nothing different with what she has been doing/eating to cause this. Taking both losartan 100mg  and the hctz 25mg . Feels that dizziness and headache are related to the hctz. Thinks that she remembers this from taking the hctz before. States that both symptoms started when restarting the hctz.  Head not hurting all day long, but always feeling dizzy since starting.   She has not done well with most categories of bp medications in past including hctz, chlorthalidone, hydralazine, lisinopril,  lopressor-hctz, spironolactone.   She denies any shortness of breath, chest pressure, chest pain.    Observations/Objective: Patient sounds worried on the phone. I do not appreciate any SOB. Speech and thought processing are grossly intact. Patient reported vitals:  Assessment and Plan: 1. Essential hypertension She feels that her dizziness and headache are coming from hctz which she has not tolerated in the past. We discussed limited options for bp medication categories due to intolerance, but went through a few options.  *we are going to start with clonidine today. I am hopeful that this will improve bp quickly for her and that we can dose adjust for tolerability/bp control. We discussed importance of taking this medication regularly. She is going to get now, take 1 tablet, and recheck bp in 1 hours time. If still 170+ she will call back.   *we also discussed consideration for atenolol. Lopressor was listed previously as not being tolerated but this was in combo with hctz and I don't see that she tried other beta blocker.   Could try alternative CCB as well since she seemed to tolerate except for edema side effect.   We will check in with her tomorrow and see how bp are running. OK to hold tomorows hctz dose until phone call. To ER if any worsening of symptoms.  - cloNIDine (CATAPRES) 0.1 MG tablet; Take 1 tablet (0.1 mg total) by mouth 2 (two) times daily.  Dispense: 180 tablet; Refill: 1  2. HYPERCHOLESTEROLEMIA On zetia and crestor.   3. Hyperparathyroidism ([place]) She has been referred to endocrinology for management.   Follow Up Instructions: Return  for we will check in tomorrow; further follow up pending this update.   I did not refer this patient for an OV in the next 24 hours for this/these issue(s).  I discussed the assessment and treatment plan with the patient. The patient was provided an opportunity to ask questions and all were answered. The patient agreed with the  plan and demonstrated an understanding of the instructions.   The patient was advised to call back or seek an in-person evaluation if the symptoms worsen or if the condition fails to improve as anticipated.  I provided 25 minutes of non-face-to-face time during this encounter.   Micheline Rough, MD

## 2019-02-12 ENCOUNTER — Other Ambulatory Visit: Payer: Self-pay

## 2019-02-12 ENCOUNTER — Telehealth (INDEPENDENT_AMBULATORY_CARE_PROVIDER_SITE_OTHER): Payer: Medicare Other | Admitting: Family Medicine

## 2019-02-12 ENCOUNTER — Encounter: Payer: Self-pay | Admitting: Family Medicine

## 2019-02-12 VITALS — BP 178/125 | HR 79 | Wt 152.0 lb

## 2019-02-12 DIAGNOSIS — E7800 Pure hypercholesterolemia, unspecified: Secondary | ICD-10-CM

## 2019-02-12 DIAGNOSIS — I10 Essential (primary) hypertension: Secondary | ICD-10-CM

## 2019-02-12 DIAGNOSIS — E213 Hyperparathyroidism, unspecified: Secondary | ICD-10-CM

## 2019-02-12 MED ORDER — CLONIDINE HCL 0.1 MG PO TABS: 0.1000 mg | ORAL_TABLET | Freq: Two times a day (BID) | ORAL | 1 refills | Status: DC

## 2019-02-13 ENCOUNTER — Telehealth: Payer: Self-pay | Admitting: *Deleted

## 2019-02-13 ENCOUNTER — Telehealth (INDEPENDENT_AMBULATORY_CARE_PROVIDER_SITE_OTHER): Payer: Medicare Other | Admitting: Family Medicine

## 2019-02-13 ENCOUNTER — Other Ambulatory Visit: Payer: Self-pay

## 2019-02-13 DIAGNOSIS — E213 Hyperparathyroidism, unspecified: Secondary | ICD-10-CM

## 2019-02-13 DIAGNOSIS — R519 Headache, unspecified: Secondary | ICD-10-CM

## 2019-02-13 DIAGNOSIS — R42 Dizziness and giddiness: Secondary | ICD-10-CM | POA: Diagnosis not present

## 2019-02-13 DIAGNOSIS — I10 Essential (primary) hypertension: Secondary | ICD-10-CM

## 2019-02-13 NOTE — Telephone Encounter (Signed)
Called the pt and she stated she is doing Ok so far, still complains of dizziness and a headache.  BP readings for today were: 9:50am--134/73-pulse 91; 10:30am--119/66-pulse 83.  Message forwarded to Dr Maudie Mercury and she also wanted to tell Dr Maudie Mercury she loves her and hopes she gets better.

## 2019-02-13 NOTE — Telephone Encounter (Signed)
See Telephone visit.

## 2019-02-13 NOTE — Telephone Encounter (Signed)
-----   Message from [name], MD sent at 02/12/2019  2:05 PM EDT ----- Please touch base with [facility] tomorrow and see how she tolerated the clonidine and how blood pressures are looking.

## 2019-02-13 NOTE — Progress Notes (Signed)
Virtual Visit via Video Note  I connected with   on 02/13/19 at 12:20 PM EDT by a video enabled telemedicine application and verified that I am speaking with the correct person using two identifiers.  Location patient: home Location provider:work or home office Persons participating in the virtual visit: patient, provider  I discussed the limitations of evaluation and management by telemedicine and the availability of in person appointments. The patient expressed understanding and agreed to proceed.   HPI:  Follow up HTN, headaches dizziness. Longstanding issues with labile HTN and dizziness. Has a number of medication intolerances and anxiety regarding antihypertensives stemming from this. Hospitalized recently and HCTZ was added. Reports since then has had headache. She saw her PCP yesterday for hospital follow up and was not feeling well on the hctz with HA and dizziness. HCTZ was stopped and clonidine was added and was advised to follow up with me today. She actually had issues with hypercalcemia on hctz in the past (sees endo for this.) BP today 134/73-pulse 91 early in the morning then 10:30am--119/66-pulse 83. Reports took clonidine last night and this morning. She did not take hctz today. Reports head is feeling better - still mild headache, but not as bad as it had been. Reports dizziness is no different then her baseline. Denies weakness, malaise, vision changes, CP, swelling, SOB, numbness or other issues. She is anxious about taking the clonidine. Continues to take losartan.   ROS: See pertinent positives and negatives per HPI.  Past Medical History:  Diagnosis Date  . Allergy   . Arthritis   . Asthma   . Colon polyps    tubulovillous adenoma, 2010  . External hemorrhoids without mention of complication   . GERD (gastroesophageal reflux disease)   . Headache    eval with neuro in 2011  . Hyperlipemia   . Hyperparathyroidism [facility])    seeing Dr. Dwyane Dee in endocrinology  .  Hypertension   . IBS (irritable bowel syndrome), GERD, hx colon polyps, chronic abd pain - followed by Dr. Sharlett Iles in GI 06/11/2012  . Osteoporosis 08/29/2011  . Vertigo    eval with neuro in 2011, brief recurrence 2016    Past Surgical History:  Procedure Laterality Date  . BACK SURGERY  1974  . CARPAL TUNNEL RELEASE Left 06/2008  . COLONOSCOPY    . EYE SURGERY  07/2017   bilateral cataract extraction  . POLYPECTOMY    . TONSILLECTOMY    . UPPER GASTROINTESTINAL ENDOSCOPY      Family History  Problem Relation Age of Onset  . Diabetes Brother   . Hypertension Brother   . Stroke Brother 31  . Hypertension Father   . Stroke Father   . Diabetes Sister   . Hypertension Sister   . Heart disease Sister        heart failure  . CAD Mother        Died of MI at age 41  . CAD Brother        Died of MI at age 35  . Stroke Sister 28  . Stroke Brother 67  . Colon cancer Neg Hx   . Hypercalcemia Neg Hx   . Colon polyps Neg Hx   . Esophageal cancer Neg Hx   . Stomach cancer Neg Hx   . Rectal cancer Neg Hx     SOCIAL HX: see hpi   Current Outpatient Medications:  .  acetaminophen (TYLENOL) 500 MG tablet, Take 500-1,000 mg by mouth every 6 (six)  hours as needed for mild pain or headache. , Disp: , Rfl:  .  cetirizine (ZYRTEC) 10 MG tablet, Take 10 mg by mouth daily., Disp: , Rfl:  .  Cholecalciferol 25 MCG (1000 UT) TBDP, Take 1,000 Units by mouth daily., Disp: 90 tablet, Rfl: 3 .  cloNIDine (CATAPRES) 0.1 MG tablet, Take 1 tablet (0.1 mg total) by mouth 2 (two) times daily., Disp: 180 tablet, Rfl: 1 .  esomeprazole (NEXIUM) 40 MG capsule, Take 1 capsule (40 mg total) by mouth 2 (two) times daily before a meal. (Patient taking differently: Take 40 mg by mouth daily as needed (for heartburn). ), Disp: 180 capsule, Rfl: 3 .  ezetimibe (ZETIA) 10 MG tablet, TAKE 1 BY MOUTH DAILY (Patient taking differently: Take 10 mg by mouth daily. ), Disp: 90 tablet, Rfl: 3 .  losartan (COZAAR)  100 MG tablet, Take 1 tablet (100 mg total) by mouth daily., Disp: 90 tablet, Rfl: 0 .  Plecanatide (TRULANCE) 3 MG TABS, Take 1 tablet by mouth daily. (Patient taking differently: Take 3 mg by mouth daily as needed (for constipation). ), Disp: 30 tablet, Rfl: 11 .  rosuvastatin (CRESTOR) 5 MG tablet, Take 1/2 tablet by mouth daily. (Patient taking differently: Take 2.5 mg by mouth daily. ), Disp: 45 tablet, Rfl: 3  EXAM:  VITALS per patient if applicable:  [id number] 91 early in the morning then 10:30am--119/66-pulse 83  GENERAL: alert, oriented, appears well and in no acute distress  HEENT: atraumatic, conjunttiva clear, no obvious abnormalities on inspection of external nose and ears  NECK: normal movements of the head and neck  LUNGS: on inspection no signs of respiratory distress, breathing rate appears normal, no obvious gross SOB, gasping or wheezing  CV: no obvious cyanosis  MS: moves all visible extremities without noticeable abnormality  PSYCH/NEURO: pleasant and cooperative, no obvious depression or anxiety, speech and thought processing grossly intact  ASSESSMENT AND PLAN:  Discussed the following assessment and plan:  Essential hypertension  Hyperparathyroidism (HCC)  Nonintractable headache, unspecified chronicity pattern, unspecified headache type  Dizziness  -we discussed possible serious and likely etiologies for the headache and the dizziness, options for evaluation and workup, limitations of telemedicine visit vs in person visit, treatment, treatment risks and precautions. Pt prefers to treat via telemedicine empirically rather then risking or undertaking an in person visit at this moment. She feels the dizziness is no different then her usual and unchanged from the hospital. She feels the headache has improved since stopping hctz and query side effect to hctz or from elevated BP vs other. Bp is much improved. Opted to continue clonidine, continue off hxtz (DCd  from med list) with home monitoring and follow up in 1-2 months. She prefers to call to schedule.  Patient agrees to seek prompt reevaluation or in person care if worsening, new symptoms arise, any concerns or if is not improving with treatment.   I discussed the assessment and treatment plan with the patient. The patient was provided an opportunity to ask questions and all were answered. The patient agreed with the plan and demonstrated an understanding of the instructions.   The patient was advised to call back or seek an in-person evaluation if the symptoms worsen or if the condition fails to improve as anticipated.   Lucretia Kern, DO

## 2019-02-17 ENCOUNTER — Ambulatory Visit: Payer: Medicare Other | Admitting: Endocrinology

## 2019-02-18 ENCOUNTER — Telehealth: Payer: Self-pay | Admitting: Family Medicine

## 2019-02-18 ENCOUNTER — Encounter: Payer: Self-pay | Admitting: Cardiology

## 2019-02-18 ENCOUNTER — Telehealth: Payer: Self-pay | Admitting: Cardiology

## 2019-02-18 ENCOUNTER — Ambulatory Visit (INDEPENDENT_AMBULATORY_CARE_PROVIDER_SITE_OTHER): Payer: Medicare Other | Admitting: Cardiology

## 2019-02-18 ENCOUNTER — Other Ambulatory Visit: Payer: Self-pay

## 2019-02-18 DIAGNOSIS — I10 Essential (primary) hypertension: Secondary | ICD-10-CM | POA: Diagnosis not present

## 2019-02-18 DIAGNOSIS — E785 Hyperlipidemia, unspecified: Secondary | ICD-10-CM

## 2019-02-18 MED ORDER — CHLORTHALIDONE 25 MG PO TABS: 12.5000 mg | ORAL_TABLET | Freq: Every day | ORAL | 0 refills | Status: DC

## 2019-02-18 MED ORDER — LOSARTAN POTASSIUM 100 MG PO TABS: 100.0000 mg | ORAL_TABLET | Freq: Every day | ORAL | 0 refills | Status: DC

## 2019-02-18 NOTE — Progress Notes (Addendum)
Cardiology Office Note:    Date:  02/18/2019   ID:  J[place] Lions, DOB 1933-08-15, MRN [id number]  PCP:  [name], MD  Cardiologist:  Kirk Ruths, MD  Electrophysiologist:  None   Referring MD: [name], MD   No chief complaint on file. post hospital follow up  History of Present Illness:    Robin [name] is a 85 y.o. female with a hx of hypertension, chronic dizziness, and multiple medication intolerances.  She had an echocardiogram in February 2014 that was normal, this was repeated during her recent admission.  MRI of her head in July 2018 showed chronic small vessel changes.  MRA was without significant vertebral vascular disease.  EEG was normal in 2018.  She has had problems with poorly controlled hypertension.  She has had problems with multiple medications.  Recently she had lower extremity edema and was taken off amlodipine and put on losartan 50 mg.  Her edema improved but her blood pressure then became poorly controlled.  Her losartan was increased to 75 mg.  She actually was sent to the ED from her primary care's office when she showed up there complaining of dizziness and had a blood pressure of [id number] systolic.  In the hospital her losartan was increased to 100 mg a day and HCT was added.  She was discharged but later stopped the HCTZ because of perceived side effects, she complained of myalgias.  This was a similar complaint when she was on a higher dose Lasix.  Her PCP added clonidine 0.1 mg twice daily.  The patient is in the office today for follow-up.  Her initial blood pressure was 0000000 systolic, repeat by me was 162/82.  She brought in readings from her blood pressure at home home.  Overall there is fair control, her blood pressure is generally higher than [id number] systolic although she did have some low blood pressures in the mornings.  She still complains of dizziness and nausea which she attributes to medications.  Past Medical History:  Diagnosis Date   . Allergy   . Arthritis   . Asthma   . Colon polyps    tubulovillous adenoma, 2010  . External hemorrhoids without mention of complication   . GERD (gastroesophageal reflux disease)   . Headache    eval with neuro in 2011  . Hyperlipemia   . Hyperparathyroidism [facility])    seeing Dr. Dwyane Dee in endocrinology  . Hypertension   . IBS (irritable bowel syndrome), GERD, hx colon polyps, chronic abd pain - followed by Dr. Sharlett Iles in GI 06/11/2012  . Osteoporosis 08/29/2011  . Vertigo    eval with neuro in 2011, brief recurrence 2016    Past Surgical History:  Procedure Laterality Date  . BACK SURGERY  1974  . CARPAL TUNNEL RELEASE Left 06/2008  . COLONOSCOPY    . EYE SURGERY  07/2017   bilateral cataract extraction  . POLYPECTOMY    . TONSILLECTOMY    . UPPER GASTROINTESTINAL ENDOSCOPY      Current Medications: Current Meds  Medication Sig  . acetaminophen (TYLENOL) 500 MG tablet Take 500-1,000 mg by mouth every 6 (six) hours as needed for mild pain or headache.   . cetirizine (ZYRTEC) 10 MG tablet Take 10 mg by mouth daily.  . Cholecalciferol 25 MCG (1000 UT) TBDP Take 1,000 Units by mouth daily.  . cloNIDine (CATAPRES) 0.1 MG tablet Take 1 tablet (0.1 mg total) by mouth 2 (two) times daily.  Marland Kitchen esomeprazole ([place])  40 MG capsule Take 1 capsule (40 mg total) by mouth 2 (two) times daily before a meal. (Patient taking differently: Take 40 mg by mouth daily as needed (for heartburn). )  . ezetimibe (ZETIA) 10 MG tablet TAKE 1 BY MOUTH DAILY (Patient taking differently: Take 10 mg by mouth daily. )  . losartan (COZAAR) 100 MG tablet Take 1 tablet (100 mg total) by mouth at bedtime.  Marland Kitchen Plecanatide (TRULANCE) 3 MG TABS Take 1 tablet by mouth daily. (Patient taking differently: Take 3 mg by mouth daily as needed (for constipation). )  . rosuvastatin (CRESTOR) 5 MG tablet Take 1/2 tablet by mouth daily. (Patient taking differently: Take 2.5 mg by mouth daily. )  . [DISCONTINUED] losartan  (COZAAR) 100 MG tablet Take 1 tablet (100 mg total) by mouth daily.     Allergies:   Prilosec [omeprazole], Amlodipine, Protonix [pantoprazole sodium], Amoxicillin, and Metronidazole   Social History   Socioeconomic History  . Marital status: Widowed    Spouse name: Not on file  . Number of children: 2  . Years of education: Not on file  . Highest education level: Not on file  Occupational History  . 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  Social Needs  . Financial resource strain: Not hard at all  . Food insecurity    Worry: Never true    Inability: Never true  . Transportation needs    Medical: No    Non-medical: No  Tobacco Use  . Smoking status: Never Smoker  . Smokeless tobacco: Never Used  Substance and Sexual Activity  . Alcohol use: No    Alcohol/week: 0.0 standard drinks  . Drug use: No  . Sexual activity: Not Currently  Lifestyle  . Physical activity    Days per week: 0 days    Minutes per session: 0 min  . Stress: Not at all  Relationships  . Social Herbalist on phone: Not on file    Gets together: Not on file    Attends religious service: Not on file    Active member of club or organization: Not on file    Attends meetings of clubs or organizations: Not on file    Relationship status: Widowed  Other Topics Concern  . Not on file  Social History Narrative   Lives alone in a one story home.  Has 2 children, one son and one daughter with 2 grandkids and 1 great-grandchild.    Retired from working for a bank.     Education: high school.      Family History: The patient's family history includes CAD in her brother and mother; Diabetes in her brother and sister; Heart disease in her sister; Hypertension in her brother, father, and sister; Stroke in her father; Stroke (age of onset: 28) in her brother and brother; Stroke (age of onset: 75) in her sister. There is no history of Colon cancer, Hypercalcemia, Colon  polyps, Esophageal cancer, Stomach cancer, or Rectal cancer.  ROS:   Please see the history of present illness.     All other systems reviewed and are negative.  EKGs/Labs/Other Studies Reviewed:    The following studies were reviewed today: Echo 02/04/2019- normal LVF, no LVH   Recent Labs: 01/21/2019: ALT 8; TSH 1.67 02/03/2019: B Natriuretic Peptide 176.0; BUN 12; Creatinine, Ser 0.72; Hemoglobin 13.1; Platelets 349; Potassium 3.8; Sodium 140  Recent Lipid Panel    Component Value Date/Time  CHOL 171 01/21/2019 1013   TRIG 78.0 01/21/2019 1013   HDL 54.30 01/21/2019 1013   CHOLHDL 3 01/21/2019 1013   VLDL 15.6 01/21/2019 1013   LDLCALC 101 (H) 01/21/2019 1013    Physical Exam:    VS:  BP (!) 188/80   Pulse 60   Temp (!) 97 F (36.1 C)   Ht 5\' 2"  (1.575 m)   Wt 161 lb 12.8 oz (73.4 kg)   BMI 29.59 kg/m     Wt Readings from Last 3 Encounters:  02/18/19 161 lb 12.8 oz (73.4 kg)  02/12/19 152 lb (68.9 kg)  02/04/19 159 lb 12.8 oz (72.5 kg)     GEN:  Well nourished, well developed in no acute distress HEENT: Normal NECK: No JVD; LCA bruit LYMPHATICS: No lymphadenopathy CARDIAC: RRR, no murmurs, rubs, gallops RESPIRATORY:  Clear to auscultation without rales, wheezing or rhonchi  ABDOMEN: Soft, non-tender, non-distended MUSCULOSKELETAL:  No edema; No deformity  SKIN: Warm and dry NEUROLOGIC:  Alert and oriented x 3 PSYCHIATRIC:  Normal affect   ASSESSMENT:    Uncontrolled hypertension Admitted 9/21-9/22/2020 with accelerated HTN. The patient has multiple drug intolerances and recent adjustments in her medications.   Dyslipidemia On low dose Crestor and Zetia  PLAN:    Add chlorthalidone 12.5 mg- f/u in 2-3 weeks.   Addendum:  Pt called late in the day to say she was also taking Lisinopril 40 mg which we were unaware of.  I suggested she stop this and reinforced that she should continue Losartan 100 mg.   Kerin Ransom PA-C 02/18/2019 4:44  PM    Medication Adjustments/Labs and Tests Ordered: Current medicines are reviewed at length with the patient today.  Concerns regarding medicines are outlined above.  No orders of the defined types were placed in this encounter.  Meds ordered this encounter  Medications  . losartan (COZAAR) 100 MG tablet    Sig: Take 1 tablet (100 mg total) by mouth at bedtime.    Dispense:  90 tablet    Refill:  0  . chlorthalidone ([place]) 25 MG tablet    Sig: Take 0.5 tablets (12.5 mg total) by mouth daily.    Dispense:  30 tablet    Refill:  0    Patient Instructions  Medication Instructions:  START [place] 12.5mg  Take 1 tablet daily  TAKE Losartan daily at bedtime  If you need a refill on your cardiac medications before your next appointment, please call your pharmacy.   Lab work: None  If you have labs (blood work) drawn today and your tests are completely normal, you will receive your results only by: Marland Kitchen MyChart Message (if you have MyChart) OR . A paper copy in the mail If you have any lab test that is abnormal or we need to change your treatment, we will call you to review the results.  Testing/Procedures: None   Follow-Up: At [facility], you and your health needs are our priority.  As part of our continuing mission to provide you with exceptional heart care, we have created designated Provider Care Teams.  These Care Teams include your primary Cardiologist (physician) and Advanced Practice Providers (APPs -  Physician Assistants and Nurse Practitioners) who all work together to provide you with the care you need, when you need it.  . Your physician recommends that you schedule a follow-up appointment in: 2-3 weeks with Kerin Ransom, PA-C  Any Other Special Instructions Will Be Listed Below (If Applicable).  Signed, Kerin Ransom, PA-C  02/18/2019 11:38 AM    [facility]

## 2019-02-18 NOTE — Telephone Encounter (Signed)
Patient called in- she states that she has been taking Lisinopril 40 mg daily.  She wanted to make [facility] aware, as she is nervous she is not suppose to be on this medication, as her medicines have been moved around so much. I did clarify that she is currently taking it, she just would like to know if she should continue it. Will route to PA since he seen her today. I added the medication to the list as well, so it was updated- if she is to stop it I will take it off.

## 2019-02-18 NOTE — Telephone Encounter (Signed)
Pt stated that she thinks her cardiologist D/C her lisinopril and added a fluid pill. She wants to know if she is supposed to continue the lisinopril. Requesting CB from [place]. Please advise. Pt is very worried.

## 2019-02-18 NOTE — Assessment & Plan Note (Signed)
On low dose Crestor and Zetia

## 2019-02-18 NOTE — Telephone Encounter (Signed)
Spoke with Lurena Joiner and he stated patient needs to stop Lisinopril and continue Losartan.

## 2019-02-18 NOTE — Assessment & Plan Note (Signed)
Admitted 9/21-9/22/2020 with accelerated HTN. The patient has multiple drug intolerances and recent adjustments in her medications.

## 2019-02-18 NOTE — Telephone Encounter (Signed)
Patient saw Kerin Ransom today, she states that one of her medication that she is currently taking is not on her list. She would like to speak to nurse about this.

## 2019-02-18 NOTE — Patient Instructions (Signed)
Medication Instructions:  START [place] 12.5mg  Take 1 tablet daily  TAKE Losartan daily at bedtime  If you need a refill on your cardiac medications before your next appointment, please call your pharmacy.   Lab work: None  If you have labs (blood work) drawn today and your tests are completely normal, you will receive your results only by: Marland Kitchen MyChart Message (if you have MyChart) OR . A paper copy in the mail If you have any lab test that is abnormal or we need to change your treatment, we will call you to review the results.  Testing/Procedures: None   Follow-Up: At [facility], you and your health needs are our priority.  As part of our continuing mission to provide you with exceptional heart care, we have created designated Provider Care Teams.  These Care Teams include your primary Cardiologist (physician) and Advanced Practice Providers (APPs -  Physician Assistants and Nurse Practitioners) who all work together to provide you with the care you need, when you need it.  . Your physician recommends that you schedule a follow-up appointment in: 2-3 weeks with Kerin Ransom, PA-C  Any Other Special Instructions Will Be Listed Below (If Applicable).

## 2019-02-19 ENCOUNTER — Telehealth: Payer: Self-pay | Admitting: Cardiology

## 2019-02-19 NOTE — Telephone Encounter (Signed)
I called the pt and informed her of the message below

## 2019-02-19 NOTE — Telephone Encounter (Signed)
Lisinopril hasn't been on our med lists for awhile (it was not on either cardiology or my recent notes in system or notes from ER). I see that cardiology started losartan in August - this was in place of amlodipine that she was reacting to with leg swelling. I don't see that he (Dr. [name]) had lisinopril on her active med list at that time either. So it must have dropped of her active list. I am not sure why, in all the visits since where we reviewed medications, it did not come up in med reconciliation.   The list that prints on AVS is our most recent list, we will just have to take caution to review home meds with this list. She can keep that list from her AVS and make sure it is consistent with home meds.

## 2019-02-19 NOTE — Telephone Encounter (Signed)
Follow Up  Patient is calling in to follow up on conversation from yesterday. Please give patient a call back.

## 2019-02-19 NOTE — Telephone Encounter (Signed)
I called the pt and informed her of the message below.  Patient stated she wanted to know when was she supposed to stop taking Lisinopril as this has been on every list of her medications until yesterday?  She wanted to know why this medication was on her list if she was not to take it? Message sent to Dr Ethlyn Gallery.

## 2019-02-19 NOTE — Telephone Encounter (Signed)
The patient called the office yesterday afternoon to let us know that she was also taking lisinopril 40 mg a day, this is not been on her previous med rec's going back to June.  I instructed her to stop her lisinopril, take her losartan 100 mg nightly, and start the chlorthalidone 12.5 mg a day.  I also asked that she bring all her medications with her to her follow-up office visits.  She called again today to make sure those instructions were placed in my chart and I assured her they would be.  Kerin Ransom PA-C 02/19/2019 1:48 PM

## 2019-02-19 NOTE — Telephone Encounter (Signed)
She should not be on lisinopril. She should continue the losartan. I have reviewed this in cardiology notes as well as phone documentation. Lisinopril and losartan work very similarly, so we do not give these together usually.

## 2019-03-04 ENCOUNTER — Other Ambulatory Visit: Payer: Self-pay

## 2019-03-04 ENCOUNTER — Ambulatory Visit (INDEPENDENT_AMBULATORY_CARE_PROVIDER_SITE_OTHER): Payer: Medicare Other | Admitting: Cardiology

## 2019-03-04 ENCOUNTER — Encounter: Payer: Self-pay | Admitting: Cardiology

## 2019-03-04 VITALS — BP 140/72 | HR 80 | Ht 62.0 in | Wt 157.2 lb

## 2019-03-04 DIAGNOSIS — I10 Essential (primary) hypertension: Secondary | ICD-10-CM

## 2019-03-04 DIAGNOSIS — Z23 Encounter for immunization: Secondary | ICD-10-CM | POA: Diagnosis not present

## 2019-03-04 DIAGNOSIS — E785 Hyperlipidemia, unspecified: Secondary | ICD-10-CM | POA: Diagnosis not present

## 2019-03-04 NOTE — Patient Instructions (Signed)
Medication Instructions:  Your physician recommends that you continue on your current medications as directed. Please refer to the Current Medication list given to you today. *If you need a refill on your cardiac medications before your next appointment, please call your pharmacy*  Lab Work: Your physician recommends that you return for lab work in: BMET If you have labs (blood work) drawn today and your tests are completely normal, you will receive your results only by: Marland Kitchen MyChart Message (if you have MyChart) OR . A paper copy in the mail If you have any lab test that is abnormal or we need to change your treatment, we will call you to review the results.  Testing/Procedures: NONE  Follow-Up: At [facility], you and your health needs are our priority.  As part of our continuing mission to provide you with exceptional heart care, we have created designated Provider Care Teams.  These Care Teams include your primary Cardiologist (physician) and Advanced Practice Providers (APPs -  Physician Assistants and Nurse Practitioners) who all work together to provide you with the care you need, when you need it.  Your next appointment:   6 months  The format for your next appointment:   In Person  Provider:   You may see Kirk Ruths, MD or one of the following Advanced Practice Providers on your designated Care Team:    Kerin Ransom, PA-C  [place], [place]  Coletta Memos, [place]   Other Instructions Flu Vaccine given today

## 2019-03-04 NOTE — Assessment & Plan Note (Signed)
On low dose Crestor and Zetia

## 2019-03-04 NOTE — Progress Notes (Signed)
Cardiology Office Note:    Date:  03/04/2019   ID:  NAJHA MOFFORD, DOB 09-07-1933, MRN [id number]  PCP:  [name], MD  Cardiologist:  Kirk Ruths, MD  Electrophysiologist:  None   Referring MD: [name], MD   No chief complaint on file. F/U B/P check  History of Present Illness:    Robin [name] is a 85 y.o. female with a hx of HTN and multiple medication intolerances. In Sept 2020 she had lower extremity edema and was taken off amlodipine and put on losartan 50 mg.  Her edema improved but her blood pressure then became poorly controlled.  Her losartan was increased to 75 mg.  She was seen by her PCP 02/03/2019 and sent to the ED  complaining of dizziness and had a blood pressure of [id number] systolic.  In the hospital her losartan was increased to 100 mg a day and HCT was added.  She was discharged but later stopped the HCTZ because of perceived side effects, she complained of myalgias.  This was a similar complaint when she was on a higher dose Lasix.  Her PCP added clonidine 0.1 mg twice daily.    I saw her in follow up 02/18/2019 and added chlorthalidone 12.5 mg daily and changed her losartan to Q HS.  She returns today for follow up.  Her daughter accompanied her.  The patient has done better since her medication changes.  She is always a little anxious and says her B/P is up when she comes to the office.  Reading from home look pretty good for her [id number] systolic.  Her B/P is elevated in the morning before she takes her medication but does come down and she appears to be tolerating her current regimen.     Past Medical History:  Diagnosis Date  . Allergy   . Arthritis   . Asthma   . Colon polyps    tubulovillous adenoma, 2010  . External hemorrhoids without mention of complication   . GERD (gastroesophageal reflux disease)   . Headache    eval with neuro in 2011  . Hyperlipemia   . Hyperparathyroidism [facility])    seeing Dr. Dwyane Dee in endocrinology  .  Hypertension   . IBS (irritable bowel syndrome), GERD, hx colon polyps, chronic abd pain - followed by Dr. Sharlett Iles in GI 06/11/2012  . Osteoporosis 08/29/2011  . Vertigo    eval with neuro in 2011, brief recurrence 2016    Past Surgical History:  Procedure Laterality Date  . BACK SURGERY  1974  . CARPAL TUNNEL RELEASE Left 06/2008  . COLONOSCOPY    . EYE SURGERY  07/2017   bilateral cataract extraction  . POLYPECTOMY    . TONSILLECTOMY    . UPPER GASTROINTESTINAL ENDOSCOPY      Current Medications: Current Meds  Medication Sig  . acetaminophen (TYLENOL) 500 MG tablet Take 500-1,000 mg by mouth every 6 (six) hours as needed for mild pain or headache.   . cetirizine (ZYRTEC) 10 MG tablet Take 10 mg by mouth daily.  . chlorthalidone (HYGROTON) 25 MG tablet Take 0.5 tablets (12.5 mg total) by mouth daily.  . Cholecalciferol 25 MCG (1000 UT) TBDP Take 1,000 Units by mouth daily.  . cloNIDine (CATAPRES) 0.1 MG tablet Take 1 tablet (0.1 mg total) by mouth 2 (two) times daily.  Marland Kitchen esomeprazole (NEXIUM) 40 MG capsule Take 40 mg by mouth as needed.  . ezetimibe (ZETIA) 10 MG tablet TAKE 1 BY MOUTH DAILY  .  losartan (COZAAR) 100 MG tablet Take 1 tablet (100 mg total) by mouth at bedtime.  Marland Kitchen Plecanatide 3 MG TABS Take by mouth as needed.  . rosuvastatin (CRESTOR) 5 MG tablet Take 1/2 tablet by mouth daily.     Allergies:   Prilosec [omeprazole], Amlodipine, Protonix [pantoprazole sodium], Amoxicillin, and Metronidazole   Social History   Socioeconomic History  . Marital status: Widowed    Spouse name: Not on file  . Number of children: 2  . Years of education: Not on file  . Highest education level: Not on file  Occupational History  . 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  Social Needs  . Financial resource strain: Not hard at all  . Food insecurity    Worry: Never true    Inability: Never true  . Transportation needs    Medical: No     Non-medical: No  Tobacco Use  . Smoking status: Never Smoker  . Smokeless tobacco: Never Used  Substance and Sexual Activity  . Alcohol use: No    Alcohol/week: 0.0 standard drinks  . Drug use: No  . Sexual activity: Not Currently  Lifestyle  . Physical activity    Days per week: 0 days    Minutes per session: 0 min  . Stress: Not at all  Relationships  . Social Herbalist on phone: Not on file    Gets together: Not on file    Attends religious service: Not on file    Active member of club or organization: Not on file    Attends meetings of clubs or organizations: Not on file    Relationship status: Widowed  Other Topics Concern  . Not on file  Social History Narrative   Lives alone in a one story home.  Has 2 children, one son and one daughter with 2 grandkids and 1 great-grandchild.    Retired from working for a bank.     Education: high school.      Family History: The patient's family history includes CAD in her brother and mother; Diabetes in her brother and sister; Heart disease in her sister; Hypertension in her brother, father, and sister; Stroke in her father; Stroke (age of onset: 32) in her brother and brother; Stroke (age of onset: 56) in her sister. There is no history of Colon cancer, Hypercalcemia, Colon polyps, Esophageal cancer, Stomach cancer, or Rectal cancer.  ROS:   Please see the history of present illness.     All other systems reviewed and are negative.  EKGs/Labs/Other Studies Reviewed:    The following studies were reviewed today: Echo 02/04/2019  Recent Labs: 01/21/2019: ALT 8; TSH 1.67 02/03/2019: B Natriuretic Peptide 176.0; BUN 12; Creatinine, Ser 0.72; Hemoglobin 13.1; Platelets 349; Potassium 3.8; Sodium 140  Recent Lipid Panel    Component Value Date/Time   CHOL 171 01/21/2019 1013   TRIG 78.0 01/21/2019 1013   HDL 54.30 01/21/2019 1013   CHOLHDL 3 01/21/2019 1013   VLDL 15.6 01/21/2019 1013   LDLCALC 101 (H) 01/21/2019 1013     Physical Exam:    VS:  BP 140/72   Pulse 80   Ht 5\' 2"  (1.575 m)   Wt 157 lb 3.2 oz (71.3 kg)   SpO2 97%   BMI 28.75 kg/m     Wt Readings from Last 3 Encounters:  03/04/19 157 lb 3.2 oz (71.3 kg)  02/18/19 161 lb 12.8 oz (73.4 kg)  02/12/19  152 lb (68.9 kg)     GEN: Well nourished, well developed in no acute distress HEENT: Normal NECK: No JVD; No carotid bruits LYMPHATICS: No lymphadenopathy CARDIAC: RRR, no murmurs, rubs, gallops RESPIRATORY:  Clear to auscultation without rales, wheezing or rhonchi  MUSCULOSKELETAL:  No edema; No deformity  SKIN: Warm and dry NEUROLOGIC:  Alert and oriented x 3 PSYCHIATRIC:  Normal affect   ASSESSMENT:    Uncontrolled hypertension Admitted 9/21-9/22/2020 with accelerated HTN. The patient has multiple drug intolerances and recent adjustments in her medications.  B/P now appears to be under pretty good control  Dyslipidemia On low dose Crestor and Zetia  PLAN:    Same Rx.  Check BMP today.  F/U Dr [name] in 6 months or refer to Dr Blenda Mounts hypertensive clinic if she has more issues with HTN or medications.    Medication Adjustments/Labs and Tests Ordered: Current medicines are reviewed at length with the patient today.  Concerns regarding medicines are outlined above.  Orders Placed This Encounter  Procedures  . Flu Vaccine QUAD High Dose(Fluad)  . Basic Metabolic Panel (BMET)   No orders of the defined types were placed in this encounter.   Patient Instructions  Medication Instructions:  Your physician recommends that you continue on your current medications as directed. Please refer to the Current Medication list given to you today. *If you need a refill on your cardiac medications before your next appointment, please call your pharmacy*  Lab Work: Your physician recommends that you return for lab work in: BMET If you have labs (blood work) drawn today and your tests are completely normal, you will receive your  results only by: Marland Kitchen MyChart Message (if you have MyChart) OR . A paper copy in the mail If you have any lab test that is abnormal or we need to change your treatment, we will call you to review the results.  Testing/Procedures: NONE  Follow-Up: At [facility], you and your health needs are our priority.  As part of our continuing mission to provide you with exceptional heart care, we have created designated Provider Care Teams.  These Care Teams include your primary Cardiologist (physician) and Advanced Practice Providers (APPs -  Physician Assistants and Nurse Practitioners) who all work together to provide you with the care you need, when you need it.  Your next appointment:   6 months  The format for your next appointment:   In Person  Provider:   You may see Kirk Ruths, MD or one of the following Advanced Practice Providers on your designated Care Team:    Kerin Ransom, PA-C  [place], [place]  Coletta Memos, [place]   Other Instructions Flu Vaccine given today    Signed, Kerin Ransom, Hershal Coria  03/04/2019 10:55 AM    [place]

## 2019-03-04 NOTE — Assessment & Plan Note (Signed)
Admitted 9/21-9/22/2020 with accelerated HTN. The patient has multiple drug intolerances and recent adjustments in her medications.  B/P now appears to be under pretty good control

## 2019-03-05 LAB — BASIC METABOLIC PANEL
BUN/Creatinine Ratio: 19 (ref 12–28)
BUN: 18 mg/dL (ref 8–27)
CO2: 26 mmol/L (ref 20–29)
Calcium: 11.2 mg/dL — ABNORMAL HIGH (ref 8.7–10.3)
Chloride: 103 mmol/L (ref 96–106)
Creatinine, Ser: 0.93 mg/dL (ref 0.57–1.00)
GFR calc Af Amer: 66 mL/min/{1.73_m2} (ref >59)
GFR calc non Af Amer: 57 mL/min/{1.73_m2} — ABNORMAL LOW (ref >59)
Glucose: 79 mg/dL (ref 65–99)
Potassium: 4.8 mmol/L (ref 3.5–5.2)
Sodium: 141 mmol/L (ref 134–144)

## 2019-03-12 ENCOUNTER — Other Ambulatory Visit: Payer: Self-pay

## 2019-03-14 ENCOUNTER — Ambulatory Visit (INDEPENDENT_AMBULATORY_CARE_PROVIDER_SITE_OTHER): Payer: Medicare Other | Admitting: Endocrinology

## 2019-03-14 ENCOUNTER — Other Ambulatory Visit: Payer: Self-pay

## 2019-03-14 ENCOUNTER — Encounter: Payer: Self-pay | Admitting: Endocrinology

## 2019-03-14 VITALS — BP 160/80 | HR 85 | Ht 62.0 in | Wt 160.0 lb

## 2019-03-14 DIAGNOSIS — E213 Hyperparathyroidism, unspecified: Secondary | ICD-10-CM | POA: Diagnosis not present

## 2019-03-14 DIAGNOSIS — E041 Nontoxic single thyroid nodule: Secondary | ICD-10-CM | POA: Diagnosis not present

## 2019-03-14 DIAGNOSIS — M85859 Other specified disorders of bone density and structure, unspecified thigh: Secondary | ICD-10-CM

## 2019-03-14 NOTE — Progress Notes (Signed)
Patient ID: Robin [name], female   DOB: 04-23-1934, 85 y.o.   MRN: [id number]          Referring physician: Dr. Ethlyn Gallery  Chief complaint: High calcium  History of Present Illness:    Review of records show that she has had a high calcium since at least 2009 However patient also thinks that she had previously seen an endocrinologist even more than 10 years ago for evaluation and was not recommended any treatment She has very poor understanding of what a high calcium means and what it is from. She has been followed for this fairly closely this year   Lab Results  Component Value Date   CALCIUM 11.2 (H) 03/04/2019   CALCIUM 10.6 (H) 02/03/2019   CALCIUM 10.5 01/21/2019   CALCIUM 10.5 (H) 01/21/2019   CALCIUM 11.3 (H) 01/13/2019   CALCIUM 10.8 (H) 12/06/2018   CALCIUM 11.0 (H) 10/21/2018   CALCIUM 10.8 (H) 10/21/2018   CALCIUM 10.8 (H) 07/15/2018     The hypercalcemia is not associated with any history of pathologic fractures, height loss, renal insufficiency, nephrolithiasis, sarcoidosis, known carcinoma or thyroid disease.   Prior serologic and radiologic studies have included:  Lab Results  Component Value Date   PTH 61 01/21/2019   CALCIUM 11.2 (H) 03/04/2019      Bone density in 2016 showed T score of -2.0 at the left hip but otherwise relatively normal levels  She takes OTC vitamin D3, 1000 units daily   25 (OH) Vitamin D level history:  Lab Results  Component Value Date   VD25OH 53.82 10/21/2018   VD25OH 41.81 04/23/2018      Allergies as of 03/14/2019      Reactions   Prilosec [omeprazole] Cough   Amlodipine    Feet and ankle swelling   Protonix [pantoprazole Sodium] Swelling   Amoxicillin Rash   Metronidazole Rash      Medication List       Accurate as of March 14, 2019  2:07 PM. If you have any questions, ask your nurse or doctor.        acetaminophen 500 MG tablet Commonly known as: TYLENOL Take 500-1,000 mg by mouth every 6 (six)  hours as needed for mild pain or headache.   cetirizine 10 MG tablet Commonly known as: ZYRTEC Take 10 mg by mouth daily.   chlorthalidone 25 MG tablet Commonly known as: HYGROTON Take 0.5 tablets (12.5 mg total) by mouth daily.   Cholecalciferol 25 MCG (1000 UT) Tbdp Take 1,000 Units by mouth daily.   cloNIDine 0.1 MG tablet Commonly known as: CATAPRES Take 1 tablet (0.1 mg total) by mouth 2 (two) times daily.   esomeprazole 40 MG capsule Commonly known as: NEXIUM Take 40 mg by mouth as needed.   ezetimibe 10 MG tablet Commonly known as: Zetia TAKE 1 BY MOUTH DAILY   losartan 100 MG tablet Commonly known as: COZAAR Take 1 tablet (100 mg total) by mouth at bedtime.   Plecanatide 3 MG Tabs Take by mouth as needed.   rosuvastatin 5 MG tablet Commonly known as: Crestor Take 1/2 tablet by mouth daily.       Allergies:  Allergies  Allergen Reactions  . Prilosec [Omeprazole] Cough  . Amlodipine     Feet and ankle swelling  . Protonix [Pantoprazole Sodium] Swelling  . Amoxicillin Rash  . Metronidazole Rash    Past Medical History:  Diagnosis Date  . Allergy   . Arthritis   . Asthma   .  Colon polyps    tubulovillous adenoma, 2010  . External hemorrhoids without mention of complication   . GERD (gastroesophageal reflux disease)   . Headache    eval with neuro in 2011  . Hyperlipemia   . Hyperparathyroidism [facility])    seeing Dr. Dwyane Dee in endocrinology  . Hypertension   . IBS (irritable bowel syndrome), GERD, hx colon polyps, chronic abd pain - followed by Dr. Sharlett Iles in GI 06/11/2012  . Osteoporosis 08/29/2011  . Vertigo    eval with neuro in 2011, brief recurrence 2016    Past Surgical History:  Procedure Laterality Date  . BACK SURGERY  1974  . CARPAL TUNNEL RELEASE Left 06/2008  . COLONOSCOPY    . EYE SURGERY  07/2017   bilateral cataract extraction  . POLYPECTOMY    . TONSILLECTOMY    . UPPER GASTROINTESTINAL ENDOSCOPY      Family History   Problem Relation Age of Onset  . Diabetes Brother   . Hypertension Brother   . Stroke Brother 64  . Hypertension Father   . Stroke Father   . Diabetes Sister   . Hypertension Sister   . Heart disease Sister        heart failure  . CAD Mother        Died of MI at age 61  . CAD Brother        Died of MI at age 74  . Stroke Sister 79  . Stroke Brother 67  . Colon cancer Neg Hx   . Hypercalcemia Neg Hx   . Colon polyps Neg Hx   . Esophageal cancer Neg Hx   . Stomach cancer Neg Hx   . Rectal cancer Neg Hx     Social History:  reports that she has never smoked. She has never used smokeless tobacco. She reports that she does not drink alcohol or use drugs.  Review of Systems  Constitutional: Negative for weight loss and reduced appetite.  Respiratory: Negative for shortness of breath.   Cardiovascular: Negative for leg swelling.  Gastrointestinal: Negative for nausea.  Endocrine: Negative for fatigue.       TSH normal in 01/2019  Genitourinary: Positive for nocturia.  Musculoskeletal: Positive for back pain.  Skin: Negative for rash.  Neurological: Negative for weakness and numbness.   She is on treatment for hypertension and takes chlorthalidone 25 mg daily since her last visit with cardiologist  Treated for hypercholesterolemia also  No history of diabetes  EXAM:  BP (!) 160/80 (BP Location: Left Arm, Patient Position: Sitting, Cuff Size: Normal)   Pulse 85   Ht 5\' 2"  (1.575 m)   Wt 160 lb (72.6 kg)   SpO2 96%   BMI 29.26 kg/m   GENERAL: Averagely built and nourished  No pallor, clubbing, lymphadenopathy or edema.    Skin:  no rash on exposed areas  EYES:  Externally normal.  ENT: Exam not indicated  THYROID: Is palpable on the right side especially on swallowing, about 1-1/2 times normal, felt mostly laterally and slightly firm and smooth.  Left lobe not palpable   HEART:  Normal  S1 and S2; no murmur or click.  CHEST:  Normal shape Lungs:   Vescicular  breath sounds heard equally.  No crepitations/ wheeze.  ABDOMEN:  No distention.  Liver and spleen not palpable.  No other mass or tenderness.  NEUROLOGICAL: .Reflexes are normal to brisk bilaterally at biceps.  SPINE AND JOINTS:  Normal.  Assessment/Plan:   HYPERCALCEMIA:  She has had longstanding hypercalcemia for well over 10 years  She appears asymptomatic Calcium level has not been above 1 point above the normal range and highest 11.3 in the past  Has not had any bone loss and her last bone density about 4 years ago showed only osteopenia at the left hip only  Calcium was slightly higher on the last measurement but may have been possibly affected by starting chlorthalidone by cardiologist  Discussed the nature of primary hyperparathyroidism as well as normal role of the parathyroid glands. Discussed potential  effects of hyperparathyroidism long-term on bone health, kidney stones and kidney function Explained to patient that surgery is indicated only there are symptoms of high calcium, calcium level over 1 point above the normal range or known osteoporosis.  She does need assessment for osteoporosis and any progression from her osteopenia in 2016 She agrees to go for a bone density May consider treatment for osteoporosis if needed  Also may consider reducing chlorthalidone dose if calcium is relatively higher in follow-up  Probable right thyroid enlargement: She will have an ultrasound to assess this, discussed possibility of having an ultrasound guided needle aspiration if she has a significant thyroid nodule although there is 95% benign rate for thyroid nodules  HYPERTENSION: Continue follow-up with PCP and cardiologist  Elayne Snare 03/14/2019, 2:07 PM

## 2019-03-18 ENCOUNTER — Ambulatory Visit (INDEPENDENT_AMBULATORY_CARE_PROVIDER_SITE_OTHER)
Admission: RE | Admit: 2019-03-18 | Discharge: 2019-03-18 | Disposition: A | Discharge status: Home / Self Care | Payer: Medicare Other | Source: Ambulatory Visit | Attending: Endocrinology | Admitting: Endocrinology

## 2019-03-18 ENCOUNTER — Other Ambulatory Visit: Payer: Self-pay

## 2019-03-18 DIAGNOSIS — E213 Hyperparathyroidism, unspecified: Secondary | ICD-10-CM | POA: Diagnosis not present

## 2019-03-18 DIAGNOSIS — M85859 Other specified disorders of bone density and structure, unspecified thigh: Secondary | ICD-10-CM

## 2019-03-24 ENCOUNTER — Ambulatory Visit (INDEPENDENT_AMBULATORY_CARE_PROVIDER_SITE_OTHER): Payer: Medicare Other | Admitting: Family Medicine

## 2019-03-24 ENCOUNTER — Encounter: Payer: Self-pay | Admitting: Family Medicine

## 2019-03-24 ENCOUNTER — Other Ambulatory Visit: Payer: Self-pay

## 2019-03-24 VITALS — BP 158/80 | HR 81 | Temp 97.9°F | Ht 62.0 in | Wt 158.9 lb

## 2019-03-24 DIAGNOSIS — N904 Leukoplakia of vulva: Secondary | ICD-10-CM

## 2019-03-24 MED ORDER — CLOBETASOL PROPIONATE 0.05 % EX CREA: 1.0000 "application " | TOPICAL_CREAM | Freq: Two times a day (BID) | CUTANEOUS | 1 refills | Status: DC

## 2019-03-24 NOTE — Progress Notes (Addendum)
Robin [name] DOB: 1933/08/07 Encounter date: 03/24/2019  This is a 85 y.o. female who presents with Chief Complaint  Patient presents with  . Vaginal Itching    x3.5 weeks, used Vagisil for 5 days and Clotrimazole with no relief  . Mouth Lesions    notices senstion of something in her mouth    History of present illness: Hasn't had fever blisters in a long time; these have healed. Feels that there is something in mouth; doesn't feel right so would like me to look at it. Just feels different, not painful.   Vaginally - started with itching, burning. Has been treating it for 3-4 weeks with the vagisil OTC; but once she stopped it came right back. The clotrimazole seemed to help but symptoms seem to come right back when she stops. No vaginal discharge. No bleeding or spotting. Hasn't had yeast infection in 40 years.    Allergies  Allergen Reactions  . Prilosec [Omeprazole] Cough  . Amlodipine     Feet and ankle swelling  . Protonix [Pantoprazole Sodium] Swelling  . Amoxicillin Rash  . Metronidazole Rash   Current Meds  Medication Sig  . acetaminophen (TYLENOL) 500 MG tablet Take 500-1,000 mg by mouth every 6 (six) hours as needed for mild pain or headache.   . cetirizine (ZYRTEC) 10 MG tablet Take 10 mg by mouth daily.  . chlorthalidone (HYGROTON) 25 MG tablet Take 0.5 tablets (12.5 mg total) by mouth daily.  . Cholecalciferol 25 MCG (1000 UT) TBDP Take 1,000 Units by mouth daily.  . cloNIDine (CATAPRES) 0.1 MG tablet Take 1 tablet (0.1 mg total) by mouth 2 (two) times daily.  Marland Kitchen esomeprazole (NEXIUM) 40 MG capsule Take 40 mg by mouth as needed.  . ezetimibe (ZETIA) 10 MG tablet TAKE 1 BY MOUTH DAILY  . losartan (COZAAR) 100 MG tablet Take 1 tablet (100 mg total) by mouth at bedtime.  Marland Kitchen Plecanatide 3 MG TABS Take by mouth as needed.  . rosuvastatin (CRESTOR) 5 MG tablet Take 1/2 tablet by mouth daily.    Review of Systems  Constitutional: Negative for chills, fatigue and  fever.  Respiratory: Negative for cough, chest tightness, shortness of breath and wheezing.   Cardiovascular: Negative for chest pain, palpitations and leg swelling.       Blood pressure drops after she takes the clonidine. Feels that with this pressure is better than it has been in a long time. At home after this med is running below [id number] systolic.   Neurological: Negative for light-headedness and headaches.    Objective:  BP (!) 158/80 (BP Location: Left Arm, Patient Position: Sitting, Cuff Size: Large)   Pulse 81   Temp 97.9 F (36.6 C) (Temporal)   Ht 5\' 2"  (1.575 m)   Wt 158 lb 14.4 oz (72.1 kg)   BMI 29.06 kg/m   Weight: 158 lb 14.4 oz (72.1 kg)   BP Readings from Last 3 Encounters:  03/24/19 (!) 158/80  03/14/19 (!) 160/80  03/04/19 140/72   Wt Readings from Last 3 Encounters:  03/24/19 158 lb 14.4 oz (72.1 kg)  03/14/19 160 lb (72.6 kg)  03/04/19 157 lb 3.2 oz (71.3 kg)    Physical Exam Exam conducted with a chaperone present.  Constitutional:      General: She is not in acute distress.    Appearance: She is well-developed.  HENT:     Head:     Comments: Canker sore left side tip of tongue; 3 small white papules,  some surrounding redness Cardiovascular:     Rate and Rhythm: Normal rate and regular rhythm.     Heart sounds: Normal heart sounds. No murmur. No friction rub.  Pulmonary:     Effort: Pulmonary effort is normal. No respiratory distress.     Breath sounds: Normal breath sounds. No wheezing or rales.  Genitourinary:    Comments: There is some paleness of skin/stretching, light erythema/external left labia.  Musculoskeletal:     Right lower leg: No edema.     Left lower leg: No edema.  Neurological:     Mental Status: She is alert and oriented to person, place, and time.  Psychiatric:        Behavior: Behavior normal.     Assessment/Plan  1. Lichen sclerosus of female genitalia We discussed this disease process. She is going to try the  clobetasol and will update me in 2 weeks.     Return for pending update in 2 weeks.    Micheline Rough, MD

## 2019-03-24 NOTE — Patient Instructions (Addendum)
Use the clobetasol to external vaginal area nightly x 4 weeks, then every other night x 4 weeks then twice/week. Update me on how things are feeling in 2 weeks time.

## 2019-03-25 DIAGNOSIS — H0100A Unspecified blepharitis right eye, upper and lower eyelids: Secondary | ICD-10-CM | POA: Diagnosis not present

## 2019-03-25 DIAGNOSIS — H52203 Unspecified astigmatism, bilateral: Secondary | ICD-10-CM | POA: Diagnosis not present

## 2019-03-25 DIAGNOSIS — D3131 Benign neoplasm of right choroid: Secondary | ICD-10-CM | POA: Diagnosis not present

## 2019-03-25 DIAGNOSIS — H18593 Other hereditary corneal dystrophies, bilateral: Secondary | ICD-10-CM | POA: Diagnosis not present

## 2019-03-27 ENCOUNTER — Telehealth: Payer: Self-pay | Admitting: Cardiology

## 2019-03-27 NOTE — Telephone Encounter (Signed)
New message    *STAT* If patient is at the pharmacy, call can be transferred to refill team.   1. Which medications need to be refilled? (please list name of each medication and dose if known) losartan (COZAAR) 100 MG tabletlosartan (COZAAR) 100 MG tablet  2. Which pharmacy/location (including street and city if local pharmacy) is medication to be sent to?[facility] [phone]  3. Do they need a 30 day or 90 day supply? [place]

## 2019-03-31 ENCOUNTER — Ambulatory Visit
Admission: RE | Admit: 2019-03-31 | Discharge: 2019-03-31 | Disposition: A | Discharge status: Home / Self Care | Payer: Medicare Other | Source: Ambulatory Visit | Attending: Endocrinology | Admitting: Endocrinology

## 2019-03-31 ENCOUNTER — Other Ambulatory Visit: Payer: Self-pay | Admitting: Cardiology

## 2019-03-31 ENCOUNTER — Telehealth: Payer: Self-pay | Admitting: Cardiology

## 2019-03-31 DIAGNOSIS — E042 Nontoxic multinodular goiter: Secondary | ICD-10-CM | POA: Diagnosis not present

## 2019-03-31 DIAGNOSIS — E041 Nontoxic single thyroid nodule: Secondary | ICD-10-CM

## 2019-03-31 DIAGNOSIS — I10 Essential (primary) hypertension: Secondary | ICD-10-CM

## 2019-03-31 NOTE — Telephone Encounter (Signed)
New message:    Please call patient concering some medication refills. Patient called last week. She has not heard from anyone concering her medications.

## 2019-03-31 NOTE — Telephone Encounter (Signed)
°*  STAT* If patient is at the pharmacy, call can be transferred to refill team.   1. Which medications need to be refilled? (please list name of each medication and dose if known) Losartan 100 mg  2. Which pharmacy/location (including street and city if local pharmacy) is medication to be sent to? [phone]  3. Do they need a 30 day or 90 day supply? [place]

## 2019-04-01 ENCOUNTER — Other Ambulatory Visit: Payer: Self-pay

## 2019-04-01 ENCOUNTER — Telehealth: Payer: Self-pay

## 2019-04-01 DIAGNOSIS — I10 Essential (primary) hypertension: Secondary | ICD-10-CM

## 2019-04-01 MED ORDER — LOSARTAN POTASSIUM 100 MG PO TABS: 100.0000 mg | ORAL_TABLET | Freq: Every day | ORAL | 1 refills | Status: DC

## 2019-04-01 MED ORDER — LOSARTAN POTASSIUM 100 MG PO TABS: 100.0000 mg | ORAL_TABLET | Freq: Every day | ORAL | 3 refills | Status: DC

## 2019-04-01 NOTE — Telephone Encounter (Signed)
Called pt to review lab results and to advise patient about Dr. Kumar's recommendations below. Pt verbalized acceptance and understanding of all information provided.  

## 2019-04-01 NOTE — Telephone Encounter (Signed)
Refilled losartan 

## 2019-04-01 NOTE — Telephone Encounter (Signed)
-----   Message from Elayne Snare, MD sent at 04/01/2019  1:34 PM EST ----- Thyroid ultrasound shows only very small benign nodules on the right side and nothing of any concern.

## 2019-04-01 NOTE — Telephone Encounter (Signed)
Spoke with patient and let her know that I had sent in refills for Losartan to [facility]. Patient told me that she had been trying to get this medication sent to that correct pharmacy ([facility]) and had spoke with three different people but had yet heard from anyone about the situation. I apologized to her about the confusion.

## 2019-04-14 ENCOUNTER — Other Ambulatory Visit: Payer: Self-pay | Admitting: Cardiology

## 2019-04-16 ENCOUNTER — Telehealth: Payer: Self-pay

## 2019-04-16 NOTE — Telephone Encounter (Signed)
Spoke with pt and she reports area has improved and she no longer has symptoms. She has continued to use the cream as directed. She would like to know if she should continue. Please advise. Thank you.

## 2019-04-16 NOTE — Telephone Encounter (Signed)
Great news! She can decrease the cream use to once daily. I would suggest we have her continue that for 2 months and then make follow up in office to recheck.

## 2019-04-16 NOTE — Telephone Encounter (Signed)
Copied from [place] [id number]. Topic: General - Inquiry >> Apr 14, 2019  9:18 AM Richardo Priest, NT wrote: Reason for CRM: Patient called in stating she is following up with medication that pcp prescribed. Pt would like PCP or nurse to return call.

## 2019-04-16 NOTE — Telephone Encounter (Signed)
Spoke with pt and advised. Appt scheduled. She does have one refill on cream. She will call if she needs another. Nothing further needed.

## 2019-04-25 ENCOUNTER — Other Ambulatory Visit: Payer: Self-pay | Admitting: Family Medicine

## 2019-04-25 ENCOUNTER — Telehealth: Payer: Self-pay | Admitting: *Deleted

## 2019-04-25 MED ORDER — CLOTRIMAZOLE 1 % EX CREA: 1.0000 "application " | TOPICAL_CREAM | Freq: Two times a day (BID) | CUTANEOUS | 2 refills | Status: DC

## 2019-04-25 NOTE — Telephone Encounter (Signed)
Spoke with the pt and she stated there is a rash present along the inner aspect of both thighs for the past 4 days.  Patient stated when she takes a shower the water hurts this area, describes redness and questionable patchy areas, denies any drainage nor itching.  Message sent to Dr Ethlyn Gallery and there are no openings in our office today.

## 2019-04-25 NOTE — Telephone Encounter (Signed)
Copied from [place] [phone]. Topic: General - Inquiry >> Apr 25, 2019  8:40 AM Mathis Bud wrote: Reason for CRM: Patient states she was prescribed clobetasol cream (TEMOVATE) 0.05 %   patient states it is not working and now has a rash on both inside of her legs. Call back [phone]

## 2019-04-25 NOTE — Telephone Encounter (Signed)
It would be best for her to be seen again in office. She had called back that vaginal area was improving with clobetasol, but if rash is different/spread, this might not be right treatment. Esp if it has spread to inside of legs, this is area that could be more fungal infection? See if you can get more details so we can recommend something through weekend if she is not able to get in today for recheck.

## 2019-04-28 ENCOUNTER — Telehealth: Payer: Self-pay

## 2019-04-28 NOTE — Telephone Encounter (Signed)
Pt called in asking about a different Losartan that was sent to her home via mail order. Pt states she has been take Losartan Potassium and the new pill she received was just Losartan and was a different size and color from her previous medication. Pt states she does not want to take it if the medication is not correct. She would like a call back at [phone].

## 2019-04-28 NOTE — Telephone Encounter (Signed)
Pt says her bottle previously stated Losartan potassium and now just says plain Losartan I have spoke with the Pharmacist and they report that her manufacturer has changed but she still feels uncomfortable.. I had her check the label description to be sure the pills look as they are reported on the label and she says they do but she says she would feel better calling the mail order pharmacy herself. I advised her to call them for reassurance that she should feel comfortable taking her meds.

## 2019-04-28 NOTE — Telephone Encounter (Signed)
Make sure pt has losartan Kirk Ruths

## 2019-06-10 ENCOUNTER — Other Ambulatory Visit: Payer: Self-pay

## 2019-06-10 ENCOUNTER — Other Ambulatory Visit (INDEPENDENT_AMBULATORY_CARE_PROVIDER_SITE_OTHER): Payer: PPO

## 2019-06-10 DIAGNOSIS — E213 Hyperparathyroidism, unspecified: Secondary | ICD-10-CM | POA: Diagnosis not present

## 2019-06-10 LAB — COMPREHENSIVE METABOLIC PANEL
ALT: 12 U/L (ref 0–35)
AST: 16 U/L (ref 0–37)
Albumin: 4.2 g/dL (ref 3.5–5.2)
Alkaline Phosphatase: 88 U/L (ref 39–117)
BUN: 24 mg/dL — ABNORMAL HIGH (ref 6–23)
CO2: 30 mEq/L (ref 19–32)
Calcium: 11.1 mg/dL — ABNORMAL HIGH (ref 8.4–10.5)
Chloride: 102 mEq/L (ref 96–112)
Creatinine, Ser: 0.83 mg/dL (ref 0.40–1.20)
GFR: 65.58 mL/min (ref >60.00)
Glucose, Bld: 93 mg/dL (ref 70–99)
Potassium: 3.9 mEq/L (ref 3.5–5.1)
Sodium: 138 mEq/L (ref 135–145)
Total Bilirubin: 0.5 mg/dL (ref 0.2–1.2)
Total Protein: 7.1 g/dL (ref 6.0–8.3)

## 2019-06-16 ENCOUNTER — Ambulatory Visit: Payer: Medicare Other | Admitting: Endocrinology

## 2019-06-16 ENCOUNTER — Other Ambulatory Visit: Payer: Self-pay

## 2019-06-18 ENCOUNTER — Ambulatory Visit (INDEPENDENT_AMBULATORY_CARE_PROVIDER_SITE_OTHER): Payer: PPO | Admitting: Family Medicine

## 2019-06-18 ENCOUNTER — Encounter: Payer: Self-pay | Admitting: Family Medicine

## 2019-06-18 VITALS — BP 140/82 | HR 100 | Temp 97.7°F | Ht 62.0 in | Wt 161.0 lb

## 2019-06-18 DIAGNOSIS — N904 Leukoplakia of vulva: Secondary | ICD-10-CM

## 2019-06-18 DIAGNOSIS — I10 Essential (primary) hypertension: Secondary | ICD-10-CM | POA: Diagnosis not present

## 2019-06-18 DIAGNOSIS — E7800 Pure hypercholesterolemia, unspecified: Secondary | ICD-10-CM

## 2019-06-18 MED ORDER — CLONIDINE HCL 0.1 MG PO TABS: 0.1000 mg | ORAL_TABLET | Freq: Two times a day (BID) | ORAL | 3 refills | Status: DC

## 2019-06-18 MED ORDER — EZETIMIBE 10 MG PO TABS: ORAL_TABLET | ORAL | 3 refills | Status: DC

## 2019-06-18 MED ORDER — LOSARTAN POTASSIUM 100 MG PO TABS: 100.0000 mg | ORAL_TABLET | Freq: Every day | ORAL | 3 refills | Status: DC

## 2019-06-18 MED ORDER — CLOBETASOL PROPIONATE 0.05 % EX CREA: 1.0000 "application " | TOPICAL_CREAM | CUTANEOUS | 1 refills | Status: DC

## 2019-06-18 MED ORDER — ROSUVASTATIN CALCIUM 5 MG PO TABS: ORAL_TABLET | ORAL | 3 refills | Status: DC

## 2019-06-18 MED ORDER — CHLORTHALIDONE 25 MG PO TABS: 12.5000 mg | ORAL_TABLET | Freq: Every day | ORAL | 3 refills | Status: DC

## 2019-06-18 NOTE — Patient Instructions (Signed)
Please record blood pressures for next week and email back to me for review. We discussed checking in the morning before medication; then in afternoon and a couple of times in evening.

## 2019-06-18 NOTE — Progress Notes (Signed)
Robin [name] DOB: 1933-05-28 Encounter date: 06/18/2019  This is a 86 y.o. female who presents with Chief Complaint  Patient presents with  . Follow-up    History of present illness: Last visit, we diagnosed her with lichen sclerosis of the vulva.  She was instructed to use clobetasol.  She had some initial relief, called back in about 1 month after visit to state that she had additional rash on her upper thighs. Thought it had improved a couple of weeks ago but started coming back. Then started with rash inside upper thighs; legs burn at night. Today feels fine. Vaginal area feels better.   HTN: blood pressure does ok with clonidine. But it does elevate when she is due for a dose. bp very high in morning (166/94); then after the clonidine feels like it drops (if she feels it down to 0000000 systolic then just feels very fatigued). Doesn't drop that low every day. Sometimes higher that above systolic when she gets up. Still taking losartan and half of the 25mg  chlorthalidone. Doesn't feel like the losartan is doing much for her.     Allergies  Allergen Reactions  . Prilosec [Omeprazole] Cough  . Amlodipine     Feet and ankle swelling  . Protonix [Pantoprazole Sodium] Swelling  . Amoxicillin Rash  . Metronidazole Rash   No outpatient medications have been marked as taking for the 06/18/19 encounter (Office Visit) with [name], MD.    Review of Systems  Constitutional: Negative for chills, fatigue and fever.  Respiratory: Negative for cough, chest tightness, shortness of breath and wheezing.   Cardiovascular: Negative for chest pain, palpitations and leg swelling.    Objective:  BP 140/82 (BP Location: Left Arm, Patient Position: Sitting, Cuff Size: Large)   Pulse 100   Temp 97.7 F (36.5 C) (Temporal)   Ht 5\' 2"  (1.575 m)   Wt 161 lb (73 kg)   SpO2 98%   BMI 29.45 kg/m   Weight: 161 lb (73 kg)   BP Readings from Last 3 Encounters:  06/18/19 140/82  03/24/19 (!)  158/80  03/14/19 (!) 160/80   Wt Readings from Last 3 Encounters:  06/18/19 161 lb (73 kg)  03/24/19 158 lb 14.4 oz (72.1 kg)  03/14/19 160 lb (72.6 kg)    Physical Exam Constitutional:      General: She is not in acute distress.    Appearance: She is well-developed.  Cardiovascular:     Rate and Rhythm: Normal rate and regular rhythm.     Heart sounds: Murmur present. Systolic murmur present with a grade of 2/6. No friction rub.  Pulmonary:     Effort: Pulmonary effort is normal. No respiratory distress.     Breath sounds: Normal breath sounds. No wheezing or rales.  Genitourinary:    Comments: Vaginal area is significantly improved from previous. Thickened tissue and white tissue has resolved.  Upper thighs -no rash appreciated. There is some light mottling that is very subtle, but otherwise no noted dermatitis. Musculoskeletal:     Right lower leg: No edema.     Left lower leg: No edema.  Neurological:     Mental Status: She is alert and oriented to person, place, and time.  Psychiatric:        Behavior: Behavior normal.     Assessment/Plan  1. Lichen sclerosus of female genitalia Continue with use of clobetasol. Avoid contact with thighs. Let me know if discomfort in thighs returns. Wear pajama pants to avoid  skin to skin contact of thighs. - clobetasol cream (TEMOVATE) 0.05 %; Apply 1 application topically 3 (three) times a week. To vaginal area  Dispense: 30 g; Refill: 1  2. Essential hypertension Improved control, but she is worried about drop in pressure with med. Will have her send me readings from home to help with bp med adjustment. Has upcoming cardiology appt as well. - cloNIDine (CATAPRES) 0.1 MG tablet; Take 1 tablet (0.1 mg total) by mouth 2 (two) times daily.  Dispense: 180 tablet; Refill: 3 - losartan (COZAAR) 100 MG tablet; Take 1 tablet (100 mg total) by mouth at bedtime.  Dispense: 90 tablet; Refill: 3 - CBC with Differential/Platelet; Future -  Comprehensive metabolic panel; Future  3. HYPERCHOLESTEROLEMIA - ezetimibe (ZETIA) 10 MG tablet; TAKE 1 BY MOUTH DAILY  Dispense: 90 tablet; Refill: 3 - rosuvastatin (CRESTOR) 5 MG tablet; Take 1/2 tablet by mouth daily.  Dispense: 45 tablet; Refill: 3 - Lipid panel; Future   Return in about 3 months (around 09/15/2019) for labwork then [place].    Micheline Rough, MD

## 2019-06-24 ENCOUNTER — Other Ambulatory Visit: Payer: Self-pay

## 2019-06-24 ENCOUNTER — Ambulatory Visit (INDEPENDENT_AMBULATORY_CARE_PROVIDER_SITE_OTHER): Payer: PPO | Admitting: Endocrinology

## 2019-06-24 ENCOUNTER — Encounter: Payer: Self-pay | Admitting: Endocrinology

## 2019-06-24 ENCOUNTER — Encounter: Payer: Self-pay | Admitting: Family Medicine

## 2019-06-24 DIAGNOSIS — E213 Hyperparathyroidism, unspecified: Secondary | ICD-10-CM

## 2019-06-24 DIAGNOSIS — M810 Age-related osteoporosis without current pathological fracture: Secondary | ICD-10-CM | POA: Diagnosis not present

## 2019-06-24 MED ORDER — RISEDRONATE SODIUM 150 MG PO TABS: 150.0000 mg | ORAL_TABLET | ORAL | 4 refills | Status: DC

## 2019-06-24 NOTE — Progress Notes (Signed)
Patient ID: Robin [name], female   DOB: Apr 12, 1934, 86 y.o.   MRN: [id number]          Referring physician: Dr. Ethlyn Gallery  I connected with the above-named patient by video enabled telemedicine application and verified that I am speaking with the correct person. The patient was explained the limitations of evaluation and management by telemedicine and the availability of in person appointments.  Patient also understood that there may be a patient responsible charge related to this service . Location of the patient: Patient's home . Location of the provider: Physician office  for the visit the patient, her daughter and myself were participating in the encounter The patient understood the above statements and agreed to proceed.   Chief complaint: Follow-up visit  History of Present Illness:  HYPERCALCEMIA:  Review of records show that she has had a high calcium since at least 2009 However patient also thinks that she had previously seen an endocrinologist even more than 10 years ago for evaluation and was not recommended any treatment  Appears to have hyperparathyroidism Calcium has been over 11 but appears stable now   Lab Results  Component Value Date   CALCIUM 11.1 (H) 06/10/2019   CALCIUM 11.2 (H) 03/04/2019   CALCIUM 10.6 (H) 02/03/2019   CALCIUM 10.5 01/21/2019   CALCIUM 10.5 (H) 01/21/2019   CALCIUM 11.3 (H) 01/13/2019   CALCIUM 10.8 (H) 12/06/2018   CALCIUM 11.0 (H) 10/21/2018   CALCIUM 10.8 (H) 10/21/2018     She has no history of pathologic fractures, height loss, renal insufficiency, nephrolithiasis, sarcoidosis, known carcinoma or thyroid disease.   Prior serologic and radiologic studies have included:  Lab Results  Component Value Date   PTH 61 01/21/2019   CALCIUM 11.1 (H) 06/10/2019      OSTEOPOROSIS:  Bone density in 2016 showed T score of -2.0 at the left hip but otherwise relatively normal levels  Follow-up bone density in 03/2019 showed the  following    Lumbar spine L1-L4(L3) Femoral neck (FN) 33% distal radius  T-score   -1.0 RFN: 2.4 LFN: -2.6 -2.7   She has not been on any treatment for osteopenia  She takes OTC vitamin D3, 1000 units daily   25 (OH) Vitamin D level history:  Lab Results  Component Value Date   VD25OH 53.82 10/21/2018   VD25OH 41.81 04/23/2018      Allergies as of 06/24/2019      Reactions   Prilosec [omeprazole] Cough   Amlodipine    Feet and ankle swelling   Protonix [pantoprazole Sodium] Swelling   Amoxicillin Rash   Metronidazole Rash      Medication List       Accurate as of June 24, 2019  2:08 PM. If you have any questions, ask your nurse or doctor.        acetaminophen 500 MG tablet Commonly known as: TYLENOL Take 500-1,000 mg by mouth every 6 (six) hours as needed for mild pain or headache.   cetirizine 10 MG tablet Commonly known as: ZYRTEC Take 10 mg by mouth daily.   chlorthalidone 25 MG tablet Commonly known as: HYGROTON Take 0.5 tablets (12.5 mg total) by mouth daily.   Cholecalciferol 25 MCG (1000 UT) Tbdp Take 1,000 Units by mouth daily.   clobetasol cream 0.05 % Commonly known as: TEMOVATE Apply 1 application topically 3 (three) times a week. To vaginal area   cloNIDine 0.1 MG tablet Commonly known as: CATAPRES Take 1 tablet (0.1 mg total) by  mouth 2 (two) times daily.   clotrimazole 1 % cream Commonly known as: Clotrimazole Anti-Fungal Apply 1 application topically 2 (two) times daily.   esomeprazole 40 MG capsule Commonly known as: NEXIUM Take 40 mg by mouth as needed.   ezetimibe 10 MG tablet Commonly known as: Zetia TAKE 1 BY MOUTH DAILY   losartan 100 MG tablet Commonly known as: COZAAR Take 1 tablet (100 mg total) by mouth at bedtime.   Plecanatide 3 MG Tabs Take by mouth as needed.   rosuvastatin 5 MG tablet Commonly known as: Crestor Take 1/2 tablet by mouth daily.       Allergies:  Allergies  Allergen Reactions  .  Prilosec [Omeprazole] Cough  . Amlodipine     Feet and ankle swelling  . Protonix [Pantoprazole Sodium] Swelling  . Amoxicillin Rash  . Metronidazole Rash    Past Medical History:  Diagnosis Date  . Allergy   . Arthritis   . Asthma   . Colon polyps    tubulovillous adenoma, 2010  . External hemorrhoids without mention of complication   . GERD (gastroesophageal reflux disease)   . Headache    eval with neuro in 2011  . Hyperlipemia   . Hyperparathyroidism [facility])    seeing Dr. Dwyane Dee in endocrinology  . Hypertension   . IBS (irritable bowel syndrome), GERD, hx colon polyps, chronic abd pain - followed by Dr. Sharlett Iles in GI 06/11/2012  . Osteoporosis 08/29/2011  . Vertigo    eval with neuro in 2011, brief recurrence 2016    Past Surgical History:  Procedure Laterality Date  . BACK SURGERY  1974  . CARPAL TUNNEL RELEASE Left 06/2008  . COLONOSCOPY    . EYE SURGERY  07/2017   bilateral cataract extraction  . POLYPECTOMY    . TONSILLECTOMY    . UPPER GASTROINTESTINAL ENDOSCOPY      Family History  Problem Relation Age of Onset  . Diabetes Brother   . Hypertension Brother   . Stroke Brother 8  . Hypertension Father   . Stroke Father   . Diabetes Sister   . Hypertension Sister   . Heart disease Sister        heart failure  . CAD Mother        Died of MI at age 3  . CAD Brother        Died of MI at age 75  . Stroke Sister 19  . Stroke Brother 37  . Colon cancer Neg Hx   . Hypercalcemia Neg Hx   . Colon polyps Neg Hx   . Esophageal cancer Neg Hx   . Stomach cancer Neg Hx   . Rectal cancer Neg Hx     Social History:  reports that she has never smoked. She has never used smokeless tobacco. She reports that she does not drink alcohol or use drugs.  Review of Systems   She is on treatment for hypertension and takes chlorthalidone 25 mg daily along with losartan and clonidine  Treated for hypercholesterolemia with Zetia and Crestor  No history of  diabetes  She was found to have thyroid enlargement on her initial visit but ultrasound shows only insignificant small nodules  EXAM:  There were no vitals taken for this visit.    Assessment/Plan:   HYPERCALCEMIA:  She has had longstanding hypercalcemia for well over 10 years  Calcium level has not been above 1 point above the normal range and highest 11.3 in the past She also is  on chlorthalidone for hypertension  OSTEOPOROSIS: Her lowest bone density is now -2.7 at the wrist and also has progression at the hip Discussed in detail with her and her daughter the increased fracture risk from this and the need for treatment and prevention of further deterioration of bone density  For simplicity she will start with a bisphosphonate and if Actonel is covered she can start 150 mg monthly Discussed how this will be taken and occasional GI side effects This would be preferred to Fosamax since she has some history of reflux  Follow-up in 4 months  Robin [name] 06/24/2019, 2:08 PM

## 2019-06-25 ENCOUNTER — Other Ambulatory Visit: Payer: Self-pay | Admitting: Family Medicine

## 2019-07-21 ENCOUNTER — Telehealth: Payer: Self-pay | Admitting: Family Medicine

## 2019-07-21 NOTE — Telephone Encounter (Signed)
clobetasol cream (TEMOVATE) 0.05 % [facility] - [place] - [address] Phone:  [phone]  Fax:  [phone]      Patient is wanting to know if Dr. Ethlyn Gallery can call her in a different Rx for cream because she has been using this since November and she's still not getting better.   Her daughter scheduled her an appointment on 07/25/2019 but was hoping that Dr. Ethlyn Gallery can just call her in something different.   Please advise

## 2019-07-21 NOTE — Telephone Encounter (Signed)
I would like for her to keep appointment and for [place] to recheck this because the vaginal skin did look better at her last visit; and rash was in a different location and was not visible at the time of her visit. There is not an obvious alternative that I can send in without reassessment.

## 2019-07-21 NOTE — Telephone Encounter (Signed)
Spoke with the pt and informed her of the message below.   

## 2019-07-24 ENCOUNTER — Other Ambulatory Visit: Payer: Self-pay

## 2019-07-25 ENCOUNTER — Encounter: Payer: Self-pay | Admitting: Family Medicine

## 2019-07-25 ENCOUNTER — Ambulatory Visit (INDEPENDENT_AMBULATORY_CARE_PROVIDER_SITE_OTHER): Payer: PPO | Admitting: Family Medicine

## 2019-07-25 VITALS — BP 144/80 | HR 88 | Temp 98.1°F | Ht 62.0 in | Wt 159.8 lb

## 2019-07-25 DIAGNOSIS — N904 Leukoplakia of vulva: Secondary | ICD-10-CM

## 2019-07-25 DIAGNOSIS — B3731 Acute candidiasis of vulva and vagina: Secondary | ICD-10-CM

## 2019-07-25 DIAGNOSIS — B373 Candidiasis of vulva and vagina: Secondary | ICD-10-CM | POA: Diagnosis not present

## 2019-07-25 MED ORDER — CLOTRIMAZOLE 1 % EX CREA: 1.0000 "application " | TOPICAL_CREAM | Freq: Two times a day (BID) | CUTANEOUS | 2 refills | Status: DC

## 2019-07-25 NOTE — Progress Notes (Signed)
Robin [name] DOB: 15-Nov-1933 Encounter date: 07/25/2019  This is a 86 y.o. female who presents with Chief Complaint  Patient presents with  . Follow-up    History of present illness:  Rash gets better and then worse again.   Right now feeling vaginal area - more inside than outside now. Still also with discoloration of legs. Legs are not itchy or irritated. Feels itching/burning in vaginal area - but this is more since going inside.   No vaginal discharge.  No urinary symptoms.    Allergies  Allergen Reactions  . Prilosec [Omeprazole] Cough  . Amlodipine     Feet and ankle swelling  . Protonix [Pantoprazole Sodium] Swelling  . Amoxicillin Rash  . Metronidazole Rash   Current Meds  Medication Sig  . acetaminophen (TYLENOL) 500 MG tablet Take 500-1,000 mg by mouth every 6 (six) hours as needed for mild pain or headache.   . cetirizine (ZYRTEC) 10 MG tablet Take 10 mg by mouth daily.  . chlorthalidone (HYGROTON) 25 MG tablet Take 0.5 tablets (12.5 mg total) by mouth daily.  . Cholecalciferol 25 MCG (1000 UT) TBDP Take 1,000 Units by mouth daily.  . clobetasol cream (TEMOVATE) [id number] % Apply 1 application topically 3 (three) times a week. To vaginal area  . cloNIDine (CATAPRES) 0.1 MG tablet Take 1 tablet (0.1 mg total) by mouth 2 (two) times daily.  . clotrimazole (CLOTRIMAZOLE ANTI-FUNGAL) 1 % cream Apply 1 application topically 2 (two) times daily.  Marland Kitchen esomeprazole (NEXIUM) 40 MG capsule Take 40 mg by mouth as needed.  . ezetimibe (ZETIA) 10 MG tablet TAKE 1 BY MOUTH DAILY  . losartan (COZAAR) 100 MG tablet Take 1 tablet (100 mg total) by mouth at bedtime.  Marland Kitchen Plecanatide 3 MG TABS Take by mouth as needed.  . risedronate (ACTONEL) 150 MG tablet Take 1 tablet (150 mg total) by mouth every 30 (thirty) days. with water on empty stomach, nothing by mouth or lie down for next 30 minutes.  . rosuvastatin (CRESTOR) 5 MG tablet Take 1/2 tablet by mouth daily.    Review of Systems   Constitutional: Negative for chills, fatigue and fever.  Respiratory: Negative for cough, chest tightness, shortness of breath and wheezing.   Cardiovascular: Negative for chest pain, palpitations and leg swelling.  Genitourinary: Negative for genital sores.       Irritability/burning in vaginal area.    Objective:  BP (!) 144/80 (BP Location: Left Arm, Patient Position: Sitting, Cuff Size: Large)   Pulse 88   Temp 98.1 F (36.7 C) (Temporal)   Ht 5\' 2"  (1.575 m)   Wt 159 lb 12.8 oz (72.5 kg)   SpO2 98%   BMI 29.23 kg/m   Weight: 159 lb 12.8 oz (72.5 kg)   BP Readings from Last 3 Encounters:  07/25/19 (!) 144/80  06/18/19 140/82  03/24/19 (!) 158/80   Wt Readings from Last 3 Encounters:  07/25/19 159 lb 12.8 oz (72.5 kg)  06/18/19 161 lb (73 kg)  03/24/19 158 lb 14.4 oz (72.1 kg)    Physical Exam Constitutional:      General: She is not in acute distress.    Appearance: Normal appearance.  Pulmonary:     Effort: Pulmonary effort is normal.  Genitourinary:    Labia:        Right: Rash present.        Left: Rash present.      Comments: Bilateral labia are slightly edematous and erythematous.  There is slight  irritation/erythema around vaginal opening.  Thickening/white appearance of the labia previously noted has improved significantly.  Erythema of upper inner thighs has also improved. Neurological:     Mental Status: She is alert.     Assessment/Plan  1. Lichen sclerosus of female genitalia This condition has improved with clobetasol use.  We discussed that this is a long-term condition and that she will likely need to be using this cream regularly in the long-term.  She had read on the packaging that cream is best only be used for 2 weeks at a time, but we discussed that in this specific situation we do use it longer.  2. Yeast vaginitis We discussed increased risk of yeast infection with diagnosis of lichen sclerosis, and that symptoms can worsen with use of  the steroid cream.  She prefers to treat topically and I suspect this will give her some relief quickly.  Have asked her to let me know if she does not get relief with this within a few days.  I have asked her to hold the clobetasol for a week until she has completed treatment for yeast infection.  Further treatment/evaluation pending update.  She does have a follow-up visit with me in the near future. - clotrimazole (CLOTRIMAZOLE ANTI-FUNGAL) 1 % cream; Apply 1 application topically 2 (two) times daily.  Dispense: 30 g; Refill: 2    Return if symptoms worsen or fail to improve.     Micheline Rough, MD

## 2019-07-25 NOTE — Patient Instructions (Signed)
*  For next seven days: use the clotrimazole cream to vaginal area - allowing some to go just inside vagina as directed (twice daily). Let me know if any worsening of symptoms or not resolved after 7 days. If burning is not improved with 3 days then apply clobetasol on top of the clotrimazole to help calm down skin irritation.   You do not need to use other creams at this time. You can resume clobetasol once or twice weekly to vaginal area after we complete above treatment.

## 2019-08-13 ENCOUNTER — Telehealth: Payer: Self-pay | Admitting: Gastroenterology

## 2019-08-13 ENCOUNTER — Encounter: Payer: Self-pay | Admitting: Family Medicine

## 2019-08-13 NOTE — Telephone Encounter (Signed)
Pt is requesting as sooner appt. That 4/13 with Robin [name], she states that stomach pain is really bothering her. Pls call her.

## 2019-08-13 NOTE — Telephone Encounter (Signed)
Patient has been rescheduled to 08/20/19 2:00

## 2019-08-18 ENCOUNTER — Other Ambulatory Visit: Payer: Self-pay

## 2019-08-18 ENCOUNTER — Ambulatory Visit (INDEPENDENT_AMBULATORY_CARE_PROVIDER_SITE_OTHER): Payer: PPO | Admitting: Family Medicine

## 2019-08-18 ENCOUNTER — Encounter: Payer: Self-pay | Admitting: Family Medicine

## 2019-08-18 VITALS — BP 160/90 | Temp 97.7°F | Ht 62.0 in | Wt 161.1 lb

## 2019-08-18 DIAGNOSIS — I10 Essential (primary) hypertension: Secondary | ICD-10-CM | POA: Diagnosis not present

## 2019-08-18 DIAGNOSIS — N904 Leukoplakia of vulva: Secondary | ICD-10-CM | POA: Diagnosis not present

## 2019-08-18 MED ORDER — FLUCONAZOLE 150 MG PO TABS: 150.0000 mg | ORAL_TABLET | Freq: Once | ORAL | 1 refills | Status: AC

## 2019-08-18 NOTE — Patient Instructions (Addendum)
*  decrease the morning clonidine to 1/2 tablet (0.5mg ) and keep recording blood pressures. If you are still dropping into the [id number] systolic please let either cardiology or myself know about this.  *I have sent in diflucan tablet for you. If you develop vaginal itching, irritation, you can take one of these and if symptoms are not improving in 48 hours let me know.   *we are going to switch to only clobetasol applied to vaginal area twice weekly. Use sparingly. If you get discomfort/burning with use then just let me know so we can adjust treatment.

## 2019-08-18 NOTE — Progress Notes (Signed)
Robin [name] DOB: [birth year] Encounter date: 08/18/2019  This is a 86 y.o. female who presents with Chief Complaint  Patient presents with  . Vaginal Discharge    History of present illness: Does have some burning down legs and a little discolored. Itching now is more in vagina than it is on the sides. States she is a lot better than when she called. Some days feels better but then flames up; nothing triggers it. This morning feels more irritation within vagina.   Was putting some cream within creases; and feels like there is some redness there.   Still getting large drop in bp after the clonidine. She has not taken her clonidine yet this morning because she cannot function about an hour after taking it due to the large drop in bp.    Allergies  Allergen Reactions  . Prilosec [Omeprazole] Cough  . Amlodipine     Feet and ankle swelling  . Protonix [Pantoprazole Sodium] Swelling  . Amoxicillin Rash  . Metronidazole Rash   Current Meds  Medication Sig  . acetaminophen (TYLENOL) 500 MG tablet Take 500-1,000 mg by mouth every 6 (six) hours as needed for mild pain or headache.   . cetirizine (ZYRTEC) 10 MG tablet Take 10 mg by mouth daily.  . chlorthalidone (HYGROTON) 25 MG tablet Take 0.5 tablets (12.5 mg total) by mouth daily.  . Cholecalciferol 25 MCG (1000 UT) TBDP Take 1,000 Units by mouth daily.  . clobetasol cream (TEMOVATE) AB-123456789 % Apply 1 application topically 3 (three) times a week. To vaginal area  . cloNIDine (CATAPRES) 0.1 MG tablet Take 1 tablet (0.1 mg total) by mouth 2 (two) times daily.  . clotrimazole (CLOTRIMAZOLE ANTI-FUNGAL) 1 % cream Apply 1 application topically 2 (two) times daily.  Marland Kitchen esomeprazole (NEXIUM) 40 MG capsule Take 40 mg by mouth as needed.  . ezetimibe (ZETIA) 10 MG tablet TAKE 1 BY MOUTH DAILY  . losartan (COZAAR) 100 MG tablet Take 1 tablet (100 mg total) by mouth at bedtime.  Marland Kitchen Plecanatide 3 MG TABS Take by mouth as needed.  . risedronate  (ACTONEL) 150 MG tablet Take 1 tablet (150 mg total) by mouth every 30 (thirty) days. with water on empty stomach, nothing by mouth or lie down for next 30 minutes.  . rosuvastatin (CRESTOR) 5 MG tablet Take 1/2 tablet by mouth daily.    Review of Systems  Constitutional: Negative for chills, fatigue and fever.  Respiratory: Negative for cough, chest tightness, shortness of breath and wheezing.   Cardiovascular: Negative for chest pain, palpitations and leg swelling.  Genitourinary: Negative for difficulty urinating, genital sores, menstrual problem, vaginal bleeding and vaginal discharge. Vaginal pain: this has improved.    Objective:  BP (!) 160/90 (BP Location: Left Arm, Patient Position: Sitting, Cuff Size: Normal)   Temp 97.7 F (36.5 C) (Temporal)   Ht 5\' 2"  (1.575 m)   Wt 161 lb 1.6 oz (73.1 kg)   BMI 29.47 kg/m   Weight: 161 lb 1.6 oz (73.1 kg)   BP Readings from Last 3 Encounters:  08/18/19 (!) 160/90  07/25/19 (!) 144/80  06/18/19 140/82   Wt Readings from Last 3 Encounters:  08/18/19 161 lb 1.6 oz (73.1 kg)  07/25/19 159 lb 12.8 oz (72.5 kg)  06/18/19 161 lb (73 kg)    Physical Exam Constitutional:      Appearance: Normal appearance.  Pulmonary:     Effort: Pulmonary effort is normal.  Genitourinary:    Exam position: Supine.  Comments: Slight erythema/edema labia, but overall vaginal tissue looks improved from prior exam. There is some light erythema upper thighs - almost in reticular pattern; skin is quite pale and this is symmetric. No current rash noted.  Neurological:     Mental Status: She is alert.     Assessment/Plan  1. Lichen sclerosus of female genitalia We are going to return to twice weekly clobetasol use. She will let me know if discomfort with this.   2. Essential hypertension Elevated today. Large drop with clonidine. Trial half tab clonidine in morning with recording of blood pressures. Sees cardiology later this month.   Return for  has visit already scheduled.    Micheline Rough, MD

## 2019-08-20 ENCOUNTER — Ambulatory Visit (INDEPENDENT_AMBULATORY_CARE_PROVIDER_SITE_OTHER): Payer: PPO | Admitting: Physician Assistant

## 2019-08-20 ENCOUNTER — Encounter: Payer: Self-pay | Admitting: Physician Assistant

## 2019-08-20 VITALS — BP 170/90 | HR 88 | Temp 97.9°F | Ht 62.0 in | Wt 160.0 lb

## 2019-08-20 DIAGNOSIS — R1013 Epigastric pain: Secondary | ICD-10-CM

## 2019-08-20 DIAGNOSIS — R1084 Generalized abdominal pain: Secondary | ICD-10-CM | POA: Diagnosis not present

## 2019-08-20 DIAGNOSIS — I10 Essential (primary) hypertension: Secondary | ICD-10-CM

## 2019-08-20 DIAGNOSIS — K581 Irritable bowel syndrome with constipation: Secondary | ICD-10-CM

## 2019-08-20 DIAGNOSIS — K219 Gastro-esophageal reflux disease without esophagitis: Secondary | ICD-10-CM | POA: Diagnosis not present

## 2019-08-20 MED ORDER — LINACLOTIDE 72 MCG PO CAPS: 72.0000 ug | ORAL_CAPSULE | Freq: Every day | ORAL | 5 refills | Status: DC

## 2019-08-20 MED ORDER — AMBULATORY NON FORMULARY MEDICATION: 1 refills | Status: DC

## 2019-08-20 NOTE — Patient Instructions (Addendum)
If you are age 84 or older, your body mass index should be between 23-30. Your Body mass index is 29.26 kg/m. If this is out of the aforementioned range listed, please consider follow up with your Primary Care Provider.  If you are age 73 or younger, your body mass index should be between 19-25. Your Body mass index is 29.26 kg/m. If this is out of the aformentioned range listed, please consider follow up with your Primary Care Provider.   1. Script given for refill on GI cocktail.  2. Stop milk of magnesia, Trulance & Miralax.  3. Complete bowel purge with Plenvu bowel prep.  4. Start Linzess 72 mcg daily starting the day after completing purge.  5. Monitor blood pressure if continues increase and you feel bad go to an urgent care or emergency room.    Anderson [place], [place] recommends that you complete a bowel purge (to clean out your bowels). Please do the following: Dose 1: Step 1: Pour Dose 1 - Mango packet into the provided mixing container. Step 2: Add 16 oz. of cool water to the container. Step 3: Either replace lid and shake container or stir contents until completely dissolved. This may take 2-3 minutes.  Step 4: Drink the entire 16 ounces of prep within a 30 minute period. Step 5: Drink an additional 16 oz container of water over 30 minutes.   After completing dose 1 wait 4 hours then complete Dose 2.  Dose 2: Step 1: Pour Dose 2- Packet A and B - Fruit punch flavor packet into the provided  mixing container. Step 2: Add 16 oz. Of cool water to the container. Step 3: Either replace lid and shake container or stir contents until completely dissolved. This may take 2-3 minutes.  Step 4: Drink the entire 16 ounces of prep within a 30 minute period. Step 5: Drink an additional 16 oz container of water over 30 minutes.    Follow up in 4-6 weeks, call the office in 3 weeks to schedule appointment.

## 2019-08-20 NOTE — Progress Notes (Signed)
Chief Complaint: Abdominal pain  HPI:    Robin [name] is an 86 y/o female with a past medical history of IBS-C and GERD and others listed below, known to Dr. Fuller Plan, who presents to clinic with a complaint of abdominal pain.    12/18/2018 patient seen in clinic by Dr. Fuller Plan and discussed that her reflux symptoms are under good control.  Constipation was managed with Trulance daily and regular use of MiraLAX as needed.  At that time she is continued on Trulance 3 mg p.o. daily and MiraLAX as needed.  She is continued on Nexium 40 mg daily for reflux.  Was discussed that her colonoscopy in 2015 did not have any precancerous polyps and there is no future surveillance colonoscopies planned due to age.    06/10/2019 CMP normal.    Today, the patient explains that she feels similar to when she saw me in 2019 when she was complaining of epigastric pain, heartburn and dysphagia.  Explains that she would like some more of the GI cocktail because this seemed to help her.  This feeling has been there over the past several weeks regardless of her Nexium 40 mg daily.  Tells me that oftentimes when she eats something she will feel like there is a "knot in my throat".  This eventually goes away.  Denies any true dysphagia at this time.    Also describes a generalized abdominal discomfort.  She thinks this may be related to the fact that her Trulance and MiraLAX stopped working.  Currently, she is using milk of magnesia every 3 to 4 days if she does not have a bowel movement.  This typically works by the next day but then she goes right back to constipation.  Tells me at one point she was given a bowel purge and feels like she needs that now.    Patient is also complaining of high blood pressure today.  Tells me that she has been feeling somewhat dizzy and hot as well as anxious.  Apparently checked her blood pressure at home and it was also high, 170/90 in the office today.  She did take all of her medications.    Denies  fever, chills, weight loss or blood in her stool.  Colonoscopy 10/2013: 1. Sessile polyp measuring 5 mm in the transverse colon; polypectomy performed with a cold snare 2. Mild diverticulosis in the sigmoid colon 3. Large internal hemorrhoids  Past Medical History:  Diagnosis Date  . Allergy   . Arthritis   . Asthma   . Colon polyps    tubulovillous adenoma, 2010  . External hemorrhoids without mention of complication   . GERD (gastroesophageal reflux disease)   . Headache    eval with neuro in 2011  . Hyperlipemia   . Hyperparathyroidism [facility])    seeing Dr. Dwyane Dee in endocrinology  . Hypertension   . IBS (irritable bowel syndrome), GERD, hx colon polyps, chronic abd pain - followed by Dr. Sharlett Iles in GI 06/11/2012  . Osteoporosis 08/29/2011  . Vertigo    eval with neuro in 2011, brief recurrence 2016    Past Surgical History:  Procedure Laterality Date  . BACK SURGERY  1974  . CARPAL TUNNEL RELEASE Left 06/2008  . COLONOSCOPY    . EYE SURGERY  07/2017   bilateral cataract extraction  . POLYPECTOMY    . TONSILLECTOMY    . UPPER GASTROINTESTINAL ENDOSCOPY      Current Outpatient Medications  Medication Sig Dispense Refill  . acetaminophen (  TYLENOL) 500 MG tablet Take 500-1,000 mg by mouth every 6 (six) hours as needed for mild pain or headache.     . cetirizine (ZYRTEC) 10 MG tablet Take 10 mg by mouth daily.    . chlorthalidone (HYGROTON) 25 MG tablet Take 0.5 tablets (12.5 mg total) by mouth daily. 45 tablet 3  . Cholecalciferol 25 MCG (1000 UT) TBDP Take 1,000 Units by mouth daily. 90 tablet 3  . clobetasol cream (TEMOVATE) AB-123456789 % Apply 1 application topically 3 (three) times a week. To vaginal area 30 g 1  . cloNIDine (CATAPRES) 0.1 MG tablet Take 1 tablet (0.1 mg total) by mouth 2 (two) times daily. 180 tablet 3  . esomeprazole (NEXIUM) 40 MG capsule Take 40 mg by mouth as needed.    . ezetimibe (ZETIA) 10 MG tablet TAKE 1 BY MOUTH DAILY 90 tablet 3  . losartan  (COZAAR) 100 MG tablet Take 1 tablet (100 mg total) by mouth at bedtime. 90 tablet 3  . Plecanatide 3 MG TABS Take by mouth as needed.    . risedronate (ACTONEL) 150 MG tablet Take 1 tablet (150 mg total) by mouth every 30 (thirty) days. with water on empty stomach, nothing by mouth or lie down for next 30 minutes. 1 tablet 4  . rosuvastatin (CRESTOR) 5 MG tablet Take 1/2 tablet by mouth daily. 45 tablet 3   No current facility-administered medications for this visit.    Allergies as of 08/20/2019 - Review Complete 08/20/2019  Allergen Reaction Noted  . Prilosec [omeprazole] Cough 08/16/2016  . Amlodipine  02/12/2019  . Protonix [pantoprazole sodium] Swelling 03/14/2016  . Amoxicillin Rash 04/28/2009  . Metronidazole Rash 04/28/2009    Family History  Problem Relation Age of Onset  . Diabetes Brother   . Hypertension Brother   . Stroke Brother 41  . Hypertension Father   . Stroke Father   . Diabetes Sister   . Hypertension Sister   . Heart disease Sister        heart failure  . CAD Mother        Died of MI at age 38  . CAD Brother        Died of MI at age 37  . Stroke Sister 24  . Stroke Brother 73  . Colon cancer Neg Hx   . Hypercalcemia Neg Hx   . Colon polyps Neg Hx   . Esophageal cancer Neg Hx   . Stomach cancer Neg Hx   . Rectal cancer Neg Hx     Social History   Socioeconomic History  . Marital status: Widowed    Spouse name: Not on file  . Number of children: 2  . Years of education: Not on file  . Highest education level: Not on file  Occupational History  . 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 Smoking status: Never Smoker  . Smokeless tobacco: Never Used  Substance and Sexual Activity  . Alcohol use: No    Alcohol/week: 0.0 standard drinks  . Drug use: No  . Sexual activity: Not Currently  Other Topics Concern  . Not on file  Social History Narrative   Lives alone in a one story home.  Has 2  children, one son and one daughter with 2 grandkids and 1 great-grandchild.    Retired from working for a bank.     Education: high school.    Social Determinants of Health  Financial Resource Strain:   . Difficulty of Paying Living Expenses:   Food Insecurity:   . Worried About Charity fundraiser in the Last Year:   . Arboriculturist in the Last Year:   Transportation Needs:   . Film/video editor (Medical):   Marland Kitchen Lack of Transportation (Non-Medical):   Physical Activity:   . Days of Exercise per Week:   . Minutes of Exercise per Session:   Stress:   . Feeling of Stress :   Social Connections:   . Frequency of Communication with Friends and Family:   . Frequency of Social Gatherings with Friends and Family:   . Attends Religious Services:   . Active Member of Clubs or Organizations:   . Attends Archivist Meetings:   Marland Kitchen Marital Status:   Intimate Partner Violence:   . Fear of Current or Ex-Partner:   . Emotionally Abused:   Marland Kitchen Physically Abused:   . Sexually Abused:     Review of Systems:    Constitutional: No weight loss, fever or chills Skin: No rash Cardiovascular: No chest pain Respiratory: No SOB Gastrointestinal: See HPI and otherwise negative   Physical Exam:  Vital signs: BP (!) 170/90   Pulse 88   Temp 97.9 F (36.6 C)   Ht 5\' 2"  (1.575 m)   Wt 160 lb (72.6 kg)   BMI 29.26 kg/m   Constitutional:   Pleasant Elderly Caucasian female appears to be in NAD, Well developed, Well nourished, alert and cooperative Respiratory: Respirations even and unlabored. Lungs clear to auscultation bilaterally.   No wheezes, crackles, or rhonchi.  Cardiovascular: Normal S1, S2. No MRG. Regular rate and rhythm. No peripheral edema, cyanosis or pallor.  Gastrointestinal:  Soft, nondistended, mild left sided ttp. No rebound or guarding. Normal bowel sounds. No appreciable masses or hepatomegaly. Psychiatric: Demonstrates good judgement and reason without abnormal  affect or behaviors.  MOST RECENT LABS AND IMAGING: CBC    Component Value Date/Time   WBC 6.4 02/03/2019 0953   RBC 4.33 02/03/2019 0953   HGB 13.1 02/03/2019 0953   HCT 41.0 02/03/2019 0953   PLT 349 02/03/2019 0953   MCV 94.7 02/03/2019 0953   MCH 30.3 02/03/2019 0953   MCHC 32.0 02/03/2019 0953   RDW 13.2 02/03/2019 0953   LYMPHSABS 1.5 03/14/2016 1020   MONOABS 0.5 03/14/2016 1020   EOSABS 0.1 03/14/2016 1020   BASOSABS 0.0 03/14/2016 1020    CMP     Component Value Date/Time   NA 138 06/10/2019 0850   NA 141 03/04/2019 1116   K 3.9 06/10/2019 0850   CL 102 06/10/2019 0850   CO2 30 06/10/2019 0850   GLUCOSE 93 06/10/2019 0850   BUN 24 (H) 06/10/2019 0850   BUN 18 03/04/2019 1116   CREATININE 0.83 06/10/2019 0850   CALCIUM 11.1 (H) 06/10/2019 0850   PROT 7.1 06/10/2019 0850   ALBUMIN 4.2 06/10/2019 0850   AST 16 06/10/2019 0850   ALT 12 06/10/2019 0850   ALKPHOS 88 06/10/2019 0850   BILITOT 0.5 06/10/2019 0850   GFRNONAA 57 (L) 03/04/2019 1116   GFRAA 66 03/04/2019 1116    Assessment: 1.  Epigastric pain: Consider relation to gastritis/reflux 2.  Generalized abdominal pain: Likely related to below 3.  IBS-C: Chronic for the patient, previously controlled on Trulance and MiraLAX, most recently stopped working over the past couple of weeks and patient has been using milk of magnesia 4.  GERD: Controlled with Nexium  40 mg daily patient is experiencing some epigastric pain which may be related 5.  Hypertension: Contributing to above symptoms at time of office visit  Plan: 1.  Refilled patient's GI cocktail to use 5-10 mL every 4-6 hours as needed. 2.  Provided the patient with Plenvu bowel purge.  Recommend that she do half of this, if she is running clear could she can stop and if not then she can continue with the other half.  Would recommend that she wait on this until she is feeling better from her high blood pressure. 3.  Discussed patient's elevated blood  pressure.  If she continues to feel bad from this would recommend that she proceed to the urgent care/ER.  She should monitor this when she gets home throughout the rest of the day.  She verbalized understanding. 4.  We will start the patient on Linzess 72 mcg 30 minutes before meal #30 with 5 refills.  Patient can discontinue her other laxatives including milk of magnesia, Trulance and MiraLAX. 5.  Patient to follow in clinic with me in 4-6 weeks or sooner if necessary.  Ellouise Newer, PA-C [facility] 08/20/2019, 2:04 PM  Cc: [name], MD

## 2019-08-20 NOTE — Addendum Note (Signed)
Addended by: Horris Latino on: 08/20/2019 02:44 PM   Modules accepted: Orders

## 2019-08-25 NOTE — Progress Notes (Signed)
Reviewed and agree with management plan.  Alorah Mcree T. Gracie Gupta, MD FACG [facility]  

## 2019-08-26 ENCOUNTER — Ambulatory Visit: Payer: PPO | Admitting: Physician Assistant

## 2019-08-28 ENCOUNTER — Telehealth: Payer: Self-pay | Admitting: Physician Assistant

## 2019-08-28 NOTE — Telephone Encounter (Signed)
Pt states that she was able to have a bm last Sunday with the medication that [facility] prescribed. Pt reports that she began taking Linzess last Tuesday but has not being able to have a bm yet only gas. She wants to know what else she can do or take since she has not had a bm since Sunday.

## 2019-08-28 NOTE — Telephone Encounter (Signed)
The pt completed a bowel purge on Sunday and started Linzess 72 mcg on Tuesday and has not had a BM.  She did have a really good BM on Sunday.  I advised that may need to give it a few more days since she completed the bowel purge.  She is going to continue the linzess 72 mcg and take miralax until she starts having bowel movements.  She will call back if she does not have any response.

## 2019-09-01 NOTE — Progress Notes (Signed)
HPI: FUdizziness and hypertension. ABIs August 2010 normal. Stress echocardiogram February 2014 normal. MRI July 2018 showed chronic small vessel ischemic changes in the cerebral white matter and pons. MRA without significant vertebrobasilar disease. CTA showed normal circle of Willis. EEG also normal.Patient has chronic mild dizziness. Echocardiogram September 2020 showed normal LV function, grade 1 diastolic dysfunction, mild left atrial enlargement, mild aortic stenosis with mean gradient 10 mmHg.  CTA September 2020 showed no pulmonary embolus.  There was note of coronary artery disease/aortic atherosclerosis.  Since last seen,patient has some dyspnea on exertion but no orthopnea or PND.  She does describe occasional pedal edema.  She has epigastric pain that has been continuous for 4 consecutive weeks.  She has not had syncope.  Current Outpatient Medications  Medication Sig Dispense Refill  . acetaminophen (TYLENOL) 500 MG tablet Take 500-1,000 mg by mouth every 6 (six) hours as needed for mild pain or headache.     . AMBULATORY NON FORMULARY MEDICATION GI cocktail: 90 ml 2% Lidocaine:74ml dicyclomine 10mg /5l:270ml Maalox - take 5-10 ml every 6 hours as needed. 240 mL 1  . cetirizine (ZYRTEC) 10 MG tablet Take 10 mg by mouth daily.    . chlorthalidone (HYGROTON) 25 MG tablet Take 0.5 tablets (12.5 mg total) by mouth daily. 45 tablet 3  . Cholecalciferol 25 MCG (1000 UT) TBDP Take 1,000 Units by mouth daily. 90 tablet 3  . clobetasol cream (TEMOVATE) [id number] % Apply 1 application topically 3 (three) times a week. To vaginal area 30 g 1  . cloNIDine (CATAPRES) 0.1 MG tablet Take 1 tablet (0.1 mg total) by mouth 2 (two) times daily. (Patient taking differently: Take 0.1 mg by mouth 2 (two) times daily. Half a tablet in the morning whole tablet in the evening.) 180 tablet 3  . esomeprazole (NEXIUM) 40 MG capsule Take 40 mg by mouth as needed.    . ezetimibe (ZETIA) 10 MG tablet TAKE 1 BY  MOUTH DAILY 90 tablet 3  . linaclotide (LINZESS) 72 MCG capsule Take 1 capsule (72 mcg total) by mouth daily before breakfast. 30 capsule 5  . losartan (COZAAR) 100 MG tablet Take 1 tablet (100 mg total) by mouth at bedtime. 90 tablet 3  . rosuvastatin (CRESTOR) 5 MG tablet Take 1/2 tablet by mouth daily. 45 tablet 3   No current facility-administered medications for this visit.     Past Medical History:  Diagnosis Date  . Allergy   . Arthritis   . Asthma   . Colon polyps    tubulovillous adenoma, 2010  . External hemorrhoids without mention of complication   . GERD (gastroesophageal reflux disease)   . Headache    eval with neuro in 2011  . Hyperlipemia   . Hyperparathyroidism [facility])    seeing Dr. Dwyane Dee in endocrinology  . Hypertension   . IBS (irritable bowel syndrome), GERD, hx colon polyps, chronic abd pain - followed by Dr. Sharlett Iles in GI 06/11/2012  . Osteoporosis 08/29/2011  . Vertigo    eval with neuro in 2011, brief recurrence 2016    Past Surgical History:  Procedure Laterality Date  . BACK SURGERY  1974  . CARPAL TUNNEL RELEASE Left 06/2008  . COLONOSCOPY    . EYE SURGERY  07/2017   bilateral cataract extraction  . POLYPECTOMY    . TONSILLECTOMY    . UPPER GASTROINTESTINAL ENDOSCOPY      Social History   Socioeconomic History  . Marital status: Widowed  Spouse name: Not on file  . Number of children: 2  . Years of education: Not on file  . Highest education level: Not on file  Occupational History  . 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 Smoking status: Never Smoker  . Smokeless tobacco: Never Used  Substance and Sexual Activity  . Alcohol use: No    Alcohol/week: 0.0 standard drinks  . Drug use: No  . Sexual activity: Not Currently  Other Topics Concern  . Not on file  Social History Narrative   Lives alone in a one story home.  Has 2 children, one son and one daughter with 2 grandkids and 1  great-grandchild.    Retired from working for a bank.     Education: high school.    Social Determinants of Health   Financial Resource Strain:   . Difficulty of Paying Living Expenses:   Food Insecurity:   . Worried About Charity fundraiser in the Last Year:   . Arboriculturist in the Last Year:   Transportation Needs:   . Film/video editor (Medical):   Marland Kitchen Lack of Transportation (Non-Medical):   Physical Activity:   . Days of Exercise per Week:   . Minutes of Exercise per Session:   Stress:   . Feeling of Stress :   Social Connections:   . Frequency of Communication with Friends and Family:   . Frequency of Social Gatherings with Friends and Family:   . Attends Religious Services:   . Active Member of Clubs or Organizations:   . Attends Archivist Meetings:   Marland Kitchen Marital Status:   Intimate Partner Violence:   . Fear of Current or Ex-Partner:   . Emotionally Abused:   Marland Kitchen Physically Abused:   . Sexually Abused:     Family History  Problem Relation Age of Onset  . Diabetes Brother   . Hypertension Brother   . Stroke Brother 77  . Hypertension Father   . Stroke Father   . Diabetes Sister   . Hypertension Sister   . Heart disease Sister        heart failure  . CAD Mother        Died of MI at age 59  . CAD Brother        Died of MI at age 4  . Stroke Sister 31  . Stroke Brother 47  . Colon cancer Neg Hx   . Hypercalcemia Neg Hx   . Colon polyps Neg Hx   . Esophageal cancer Neg Hx   . Stomach cancer Neg Hx   . Rectal cancer Neg Hx     ROS: no fevers or chills, productive cough, hemoptysis, dysphasia, odynophagia, melena, hematochezia, dysuria, hematuria, rash, seizure activity, orthopnea, PND, pedal edema, claudication. Remaining systems are negative.  Physical Exam: Well-developed well-nourished in no acute distress.  Skin is warm and dry.  HEENT is normal.  Neck is supple.  Chest is clear to auscultation with normal expansion.  Cardiovascular  exam is regular rate and rhythm.  1/6 systolic murmur left sternal border. Abdominal exam nontender or distended. No masses palpated. Extremities show no edema. neuro grossly intact  A/P  1 hypertension-blood pressure mildly elevated today.  She keeps records of this and she has dropped in the morning occasionally.  She states she feels weak with this.  In general though it appears to be controlled.  We will  continue present medications and follow.  2 history of presyncope-patient has chronic dizziness but has not had syncope.  Previous evaluation negative.  3 mild aortic stenosis-patient will need follow-up echoes in the future.  4 hyperlipidemia-continue statin.  5 coronary artery disease/aortic atherosclerosis-noted on CTA.  We will continue with  statin.  Kirk Ruths, MD

## 2019-09-03 ENCOUNTER — Telehealth: Payer: Self-pay | Admitting: Physician Assistant

## 2019-09-03 NOTE — Telephone Encounter (Signed)
Pt returning your call

## 2019-09-03 NOTE — Telephone Encounter (Signed)
Left message on machine to call back  

## 2019-09-04 NOTE — Telephone Encounter (Signed)
The pt is taking linzess 72 mcg two at once daily as well as one dose of miralax.  Small BM this morning- first since 4/18.  Completed purge on 4/11 that worked very well.  She does not think that the stool is very hard when she does have a bm.  I advised her that she may just not have the need to have a big BM since she did the purge recently.  Will forward to [facility] for further req's

## 2019-09-04 NOTE — Telephone Encounter (Signed)
She can increase to miralax BID for a while to see if this helps.  Thanks-JLL

## 2019-09-04 NOTE — Telephone Encounter (Signed)
The pt has been advised and will call back if she gets no response

## 2019-09-08 ENCOUNTER — Ambulatory Visit: Payer: PPO | Admitting: Cardiology

## 2019-09-08 ENCOUNTER — Other Ambulatory Visit: Payer: Self-pay

## 2019-09-08 ENCOUNTER — Encounter: Payer: Self-pay | Admitting: Cardiology

## 2019-09-08 VITALS — BP 152/90 | HR 96 | Ht 62.0 in | Wt 159.0 lb

## 2019-09-08 DIAGNOSIS — I350 Nonrheumatic aortic (valve) stenosis: Secondary | ICD-10-CM | POA: Diagnosis not present

## 2019-09-08 DIAGNOSIS — E7800 Pure hypercholesterolemia, unspecified: Secondary | ICD-10-CM

## 2019-09-08 DIAGNOSIS — I10 Essential (primary) hypertension: Secondary | ICD-10-CM

## 2019-09-08 NOTE — Patient Instructions (Signed)

## 2019-09-15 ENCOUNTER — Ambulatory Visit (INDEPENDENT_AMBULATORY_CARE_PROVIDER_SITE_OTHER): Payer: PPO | Admitting: Family Medicine

## 2019-09-15 ENCOUNTER — Other Ambulatory Visit: Payer: Self-pay

## 2019-09-15 ENCOUNTER — Encounter: Payer: Self-pay | Admitting: Family Medicine

## 2019-09-15 VITALS — BP 140/80 | HR 89 | Temp 97.9°F | Ht 62.0 in | Wt 160.7 lb

## 2019-09-15 DIAGNOSIS — E785 Hyperlipidemia, unspecified: Secondary | ICD-10-CM

## 2019-09-15 DIAGNOSIS — E559 Vitamin D deficiency, unspecified: Secondary | ICD-10-CM | POA: Diagnosis not present

## 2019-09-15 DIAGNOSIS — E538 Deficiency of other specified B group vitamins: Secondary | ICD-10-CM | POA: Diagnosis not present

## 2019-09-15 DIAGNOSIS — K5900 Constipation, unspecified: Secondary | ICD-10-CM

## 2019-09-15 DIAGNOSIS — L309 Dermatitis, unspecified: Secondary | ICD-10-CM

## 2019-09-15 DIAGNOSIS — I10 Essential (primary) hypertension: Secondary | ICD-10-CM | POA: Diagnosis not present

## 2019-09-15 LAB — CBC WITH DIFFERENTIAL/PLATELET
Basophils Absolute: 0 10*3/uL (ref 0.0–0.1)
Basophils Relative: 0.6 % (ref 0.0–3.0)
Eosinophils Absolute: 0.1 10*3/uL (ref 0.0–0.7)
Eosinophils Relative: 1.2 % (ref 0.0–5.0)
HCT: 36.7 % (ref 36.0–46.0)
Hemoglobin: 12.4 g/dL (ref 12.0–15.0)
Lymphocytes Relative: 17.7 % (ref 12.0–46.0)
Lymphs Abs: 1.2 10*3/uL (ref 0.7–4.0)
MCHC: 33.8 g/dL (ref 30.0–36.0)
MCV: 91.7 fl (ref 78.0–100.0)
Monocytes Absolute: 0.4 10*3/uL (ref 0.1–1.0)
Monocytes Relative: 6.1 % (ref 3.0–12.0)
Neutro Abs: 5.1 10*3/uL (ref 1.4–7.7)
Neutrophils Relative %: 74.4 % (ref 43.0–77.0)
Platelets: 370 10*3/uL (ref 150.0–400.0)
RBC: 4 Mil/uL (ref 3.87–5.11)
RDW: 12.8 % (ref 11.5–15.5)
WBC: 6.8 10*3/uL (ref 4.0–10.5)

## 2019-09-15 LAB — LIPID PANEL
Cholesterol: 164 mg/dL (ref 0–200)
HDL: 53 mg/dL (ref >39.00)
LDL Cholesterol: 84 mg/dL (ref 0–99)
NonHDL: 111.05
Total CHOL/HDL Ratio: 3
Triglycerides: 136 mg/dL (ref 0.0–149.0)
VLDL: 27.2 mg/dL (ref 0.0–40.0)

## 2019-09-15 LAB — COMPREHENSIVE METABOLIC PANEL
ALT: 10 U/L (ref 0–35)
AST: 16 U/L (ref 0–37)
Albumin: 4 g/dL (ref 3.5–5.2)
Alkaline Phosphatase: 82 U/L (ref 39–117)
BUN: 18 mg/dL (ref 6–23)
CO2: 30 mEq/L (ref 19–32)
Calcium: 11.2 mg/dL — ABNORMAL HIGH (ref 8.4–10.5)
Chloride: 102 mEq/L (ref 96–112)
Creatinine, Ser: 0.87 mg/dL (ref 0.40–1.20)
GFR: 62.07 mL/min (ref >60.00)
Glucose, Bld: 86 mg/dL (ref 70–99)
Potassium: 4.2 mEq/L (ref 3.5–5.1)
Sodium: 138 mEq/L (ref 135–145)
Total Bilirubin: 0.4 mg/dL (ref 0.2–1.2)
Total Protein: 6.5 g/dL (ref 6.0–8.3)

## 2019-09-15 LAB — VITAMIN D 25 HYDROXY (VIT D DEFICIENCY, FRACTURES): VITD: 52.47 ng/mL (ref 30.00–100.00)

## 2019-09-15 LAB — VITAMIN B12: Vitamin B-12: 158 pg/mL — ABNORMAL LOW (ref 211–911)

## 2019-09-15 NOTE — Progress Notes (Signed)
SEHRISH WENNINGER DOB: 1933-10-01 Encounter date: 09/15/2019  This is a 86 y.o. female who presents with Chief Complaint  Patient presents with  . Follow-up    History of present illness:  Bottom is better, but "still there" - reared up last week and used medicine once - but now getting burning in leg where she has the discoloration. Used cetaphil on this, but it didn't work. Not sure if there is color change in legs.   HTN: states that she had a good visit with Dr. [name] and has followed up and dropped off before and after bp meds. Can drop from 140's down to 118. She still doesn't feel well when her pressure gets that low.    Allergies  Allergen Reactions  . Prilosec [Omeprazole] Cough  . Amlodipine     Feet and ankle swelling  . Protonix [Pantoprazole Sodium] Swelling  . Amoxicillin Rash  . Metronidazole Rash   Current Meds  Medication Sig  . acetaminophen (TYLENOL) 500 MG tablet Take 500-1,000 mg by mouth every 6 (six) hours as needed for mild pain or headache.   . AMBULATORY NON FORMULARY MEDICATION GI cocktail: 90 ml 2% Lidocaine:95ml dicyclomine 10mg /5l:280ml Maalox - take 5-10 ml every 6 hours as needed.  . cetirizine (ZYRTEC) 10 MG tablet Take 10 mg by mouth daily.  . chlorthalidone (HYGROTON) 25 MG tablet Take 0.5 tablets (12.5 mg total) by mouth daily.  . Cholecalciferol 25 MCG (1000 UT) TBDP Take 1,000 Units by mouth daily.  . clobetasol cream (TEMOVATE) [id number] % Apply 1 application topically 3 (three) times a week. To vaginal area  . cloNIDine (CATAPRES) 0.1 MG tablet Take 1 tablet (0.1 mg total) by mouth 2 (two) times daily. (Patient taking differently: Take 0.1 mg by mouth 2 (two) times daily. Half a tablet in the morning whole tablet in the evening.)  . esomeprazole (NEXIUM) 40 MG capsule Take 40 mg by mouth as needed.  . ezetimibe (ZETIA) 10 MG tablet TAKE 1 BY MOUTH DAILY  . linaclotide (LINZESS) 72 MCG capsule Take 1 capsule (72 mcg total) by mouth daily before  breakfast.  . losartan (COZAAR) 100 MG tablet Take 1 tablet (100 mg total) by mouth at bedtime.  . rosuvastatin (CRESTOR) 5 MG tablet Take 1/2 tablet by mouth daily.    Review of Systems  Constitutional: Negative for chills, fatigue and fever.  Respiratory: Negative for cough, chest tightness, shortness of breath and wheezing.   Cardiovascular: Negative for chest pain, palpitations and leg swelling.  Skin: Positive for rash (seehpi).    Objective:  BP 140/80 (BP Location: Left Arm, Patient Position: Sitting, Cuff Size: Large)   Pulse 89   Temp 97.9 F (36.6 C) (Temporal)   Ht 5\' 2"  (1.575 m)   Wt 160 lb 11.2 oz (72.9 kg)   BMI 29.39 kg/m   Weight: 160 lb 11.2 oz (72.9 kg)   BP Readings from Last 3 Encounters:  09/15/19 140/80  09/08/19 (!) 152/90  08/20/19 (!) 170/90   Wt Readings from Last 3 Encounters:  09/15/19 160 lb 11.2 oz (72.9 kg)  09/08/19 159 lb (72.1 kg)  08/20/19 160 lb (72.6 kg)    Physical Exam Constitutional:      General: She is not in acute distress.    Appearance: She is well-developed.  Cardiovascular:     Rate and Rhythm: Normal rate and regular rhythm.     Heart sounds: Murmur present. Systolic murmur present with a grade of 1/6. No friction rub.  Pulmonary:     Effort: Pulmonary effort is normal. No respiratory distress.     Breath sounds: Normal breath sounds. No wheezing or rales.  Musculoskeletal:     Right lower leg: No edema.     Left lower leg: No edema.  Skin:    Comments: There is not an obvious rash in her upper inner thighs.  Currently cannot appreciate hyperpigmentation that patient mentions.  Neurological:     Mental Status: She is alert and oriented to person, place, and time.  Psychiatric:        Behavior: Behavior normal.     Assessment/Plan  1. Dermatitis Overall, skin and vaginal area has improved with steroid treatment.  No current infection noted on the thighs.  Going to start with some blood work and see if there are  any other contributors to the evening nerve sensation that she has.  Follow-up pending results.  2. Constipation, unspecified constipation type This has improved.  She is keeping stools moving well and working to keep from being constipated.  3. Essential hypertension Blood pressure still up and down.  She is checking regularly and following with cardiology.  It is stable in the office today.  Unfortunately, she had difficulty with tolerating other medicine classes which limits med selection. - CBC with Differential/Platelet; Future - Comprehensive metabolic panel; Future - Comprehensive metabolic panel - CBC with Differential/Platelet  4. B12 deficiency Recheck baseline - Vitamin B12; Future - Vitamin B12  5. Vitamin D deficiency - VITAMIN D 25 Hydroxy (Vit-D Deficiency, Fractures); Future - VITAMIN D 25 Hydroxy (Vit-D Deficiency, Fractures)  6. Dyslipidemia Recheck cholesterol.  Continue current medications for now. - Lipid panel; Future - Lipid panel   She does not wish to be on any bone density treatment.  She worries about side effects of medication.  We discussed pros and cons of this today, but she would prefer to work on maintaining normal levels of vitamin D and calcium and start with weightbearing exercise. Return for pending labwork.    Micheline Rough, MD

## 2019-10-15 ENCOUNTER — Ambulatory Visit: Payer: PPO | Admitting: Family Medicine

## 2019-10-22 ENCOUNTER — Ambulatory Visit (INDEPENDENT_AMBULATORY_CARE_PROVIDER_SITE_OTHER): Payer: PPO | Admitting: Family Medicine

## 2019-10-22 ENCOUNTER — Encounter: Payer: Self-pay | Admitting: Family Medicine

## 2019-10-22 ENCOUNTER — Other Ambulatory Visit: Payer: Self-pay

## 2019-10-22 VITALS — BP 140/90 | HR 84 | Temp 97.8°F | Ht 62.0 in | Wt 162.6 lb

## 2019-10-22 DIAGNOSIS — R350 Frequency of micturition: Secondary | ICD-10-CM

## 2019-10-22 DIAGNOSIS — N761 Subacute and chronic vaginitis: Secondary | ICD-10-CM

## 2019-10-22 DIAGNOSIS — R208 Other disturbances of skin sensation: Secondary | ICD-10-CM | POA: Diagnosis not present

## 2019-10-22 LAB — POC URINALSYSI DIPSTICK (AUTOMATED)
Bilirubin, UA: NEGATIVE
Blood, UA: NEGATIVE
Glucose, UA: NEGATIVE
Ketones, UA: NEGATIVE
Leukocytes, UA: NEGATIVE
Nitrite, UA: NEGATIVE
Protein, UA: NEGATIVE
Spec Grav, UA: 1.02 (ref 1.010–1.025)
Urobilinogen, UA: 0.2 E.U./dL
pH, UA: 6.5 (ref 5.0–8.0)

## 2019-10-22 NOTE — Patient Instructions (Signed)
Consider benadryl cream for burning/irritation

## 2019-10-22 NOTE — Addendum Note (Signed)
Addended by: Agnes Lawrence on: 10/22/2019 09:29 AM   Modules accepted: Orders

## 2019-10-22 NOTE — Progress Notes (Signed)
Robin [name] DOB: February 02, 1934 Encounter date: 10/22/2019  This is a 86 y.o. female who presents with Chief Complaint  Patient presents with  . Rash    patient complains of recurrent genital rash    History of present illness:  Thought rash was getting better, but just came right back. What is really bothering her is spots going down back of legs now - burning.   Not putting anything on rash right now. Was getting burning with creams when she used those. More itching in vaginal area. Legs really burning in shower. Staying aggravated.  First visit for vaginal itching was back in 03/2019 (had been going on for her about a month at that time). She had used otc treatments for yeast infection without improvement. On exam at that time, she had a very apparent lichen sclerosus. Clobetasol was started at that time. After a month of use, developed rash on upper thighs and at that time was coming and going. At time of exam in feb; upper thigh rash was resolved, so course of lichen sclerosis was discussed and we reviewed making sure cream was only on vaginal area, not spreading to upper thighs. Follow up one month later she had increased vaginal discharge and yeast vaginitis, which we discussed was potential side effect of the clobetasol cream. She was treated with clotrimazole at that time.  By April - still with burning upper thighs, was better at time of visit. Still itching vaginal area.   With clobetasol, there has been some improvement on exam, but patient has struggled with topical treatments.   Allergies  Allergen Reactions  . Prilosec [Omeprazole] Cough  . Amlodipine     Feet and ankle swelling  . Protonix [Pantoprazole Sodium] Swelling  . Amoxicillin Rash  . Metronidazole Rash   Current Meds  Medication Sig  . acetaminophen (TYLENOL) 500 MG tablet Take 500-1,000 mg by mouth every 6 (six) hours as needed for mild pain or headache.   . AMBULATORY NON FORMULARY MEDICATION GI cocktail:  90 ml 2% Lidocaine:56ml dicyclomine 10mg /5l:279ml Maalox - take 5-10 ml every 6 hours as needed.  . cetirizine (ZYRTEC) 10 MG tablet Take 10 mg by mouth daily.  . chlorthalidone (HYGROTON) 25 MG tablet Take 0.5 tablets (12.5 mg total) by mouth daily.  . Cholecalciferol 25 MCG (1000 UT) TBDP Take 1,000 Units by mouth daily.  . clobetasol cream (TEMOVATE) 7.09 % Apply 1 application topically 3 (three) times a week. To vaginal area  . cloNIDine (CATAPRES) 0.1 MG tablet Take 1 tablet (0.1 mg total) by mouth 2 (two) times daily. (Patient taking differently: Take 0.1 mg by mouth 2 (two) times daily. Half a tablet in the morning whole tablet in the evening.)  . esomeprazole (NEXIUM) 40 MG capsule Take 40 mg by mouth as needed.  . ezetimibe (ZETIA) 10 MG tablet TAKE 1 BY MOUTH DAILY  . linaclotide (LINZESS) 72 MCG capsule Take 1 capsule (72 mcg total) by mouth daily before breakfast.  . losartan (COZAAR) 100 MG tablet Take 1 tablet (100 mg total) by mouth at bedtime.  . rosuvastatin (CRESTOR) 5 MG tablet Take 1/2 tablet by mouth daily.  . vitamin B-12 (CYANOCOBALAMIN) 1000 MCG tablet Take 1,000 mcg by mouth daily.    Review of Systems  Constitutional: Negative for chills, fatigue and fever.  Respiratory: Negative for cough, chest tightness, shortness of breath and wheezing.   Cardiovascular: Negative for chest pain, palpitations and leg swelling.  Genitourinary: Positive for frequency and urgency.  Objective:  BP 140/90 (BP Location: Left Arm, Patient Position: Sitting, Cuff Size: Large)   Pulse 84   Temp 97.8 F (36.6 C) (Temporal)   Ht 5\' 2"  (1.575 m)   Wt 162 lb 9.6 oz (73.8 kg)   BMI 29.74 kg/m   Weight: 162 lb 9.6 oz (73.8 kg)   BP Readings from Last 3 Encounters:  10/22/19 140/90  09/15/19 140/80  09/08/19 (!) 152/90   Wt Readings from Last 3 Encounters:  10/22/19 162 lb 9.6 oz (73.8 kg)  09/15/19 160 lb 11.2 oz (72.9 kg)  09/08/19 159 lb (72.1 kg)    Physical  Exam Constitutional:      Appearance: Normal appearance.  Genitourinary:    Comments: Some erythema/edema labia. No apparent discharge. There is no surrounding erythema or dermatiits apreciated. There is a VERY mild reticular light pattern inner thighs. No obvious dermatitis associated with the burning sensation.  Neurological:     Mental Status: She is alert.     Assessment/Plan  1. Subacute vaginitis She has struggled with topical treatments. Initially diagnosis was lichen sclerosis (which we have discussed is a chronic diagnosis), but this has improved with topical treatment.  However, unfortunately she has had a burning sensation in her upper thighs since trying topical treatment for the vagina as well as discomfort with use of clobetasol (as well as clotrimazole when she had yeast vaginitis) when prescribed.  She has had a very difficult time with oral medications in the past as well and wonders if sensitivity to topical creams.  She wonders if there is any relation with starting chlorthalidone and clonidine.  She is agreed to see gynecology for further evaluation of the vaginitis, lichen sclerosis.  I would like their input on best way to manage the vaginal issues without having side effects that she is currently experiencing. - Ambulatory referral to Gynecology  2. Burning sensation of skin See above. There is not significant obvious physical abnormality of the skin that I feel we can treat.  I did encourage her to potentially try Benadryl cream to help with irritation in the skin, because I do not feel that she would have a difficult time with tolerating this, and since the burning sensation flares with heat, I wonder if there is some histamine response.  3. Urinary frequency We will check urine today to make sure no infection.  She has had frequency for a long time, we do not have a recent urine on her.  Certainly having infection would create some additional inflammation in the pelvic  area. - Urinalysis; Future - Culture, Urine   Return if symptoms worsen or fail to improve. Chart review, visit, note completion 25 minutes.   Micheline Rough, MD

## 2019-10-29 ENCOUNTER — Ambulatory Visit: Payer: PPO | Admitting: Family Medicine

## 2019-11-04 ENCOUNTER — Ambulatory Visit: Payer: PPO | Admitting: Obstetrics and Gynecology

## 2019-11-18 ENCOUNTER — Ambulatory Visit: Payer: PPO | Admitting: Obstetrics & Gynecology

## 2019-11-18 ENCOUNTER — Other Ambulatory Visit (HOSPITAL_COMMUNITY)
Admission: RE | Admit: 2019-11-18 | Discharge: 2019-11-18 | Disposition: A | Discharge status: Home / Self Care | Payer: PPO | Source: Ambulatory Visit | Attending: Obstetrics & Gynecology | Admitting: Obstetrics & Gynecology

## 2019-11-18 ENCOUNTER — Other Ambulatory Visit: Payer: Self-pay

## 2019-11-18 ENCOUNTER — Encounter: Payer: Self-pay | Admitting: Obstetrics & Gynecology

## 2019-11-18 VITALS — BP 140/80 | Ht 60.0 in | Wt 160.0 lb

## 2019-11-18 DIAGNOSIS — N904 Leukoplakia of vulva: Secondary | ICD-10-CM | POA: Diagnosis not present

## 2019-11-18 DIAGNOSIS — I160 Hypertensive urgency: Secondary | ICD-10-CM | POA: Diagnosis not present

## 2019-11-18 DIAGNOSIS — L292 Pruritus vulvae: Secondary | ICD-10-CM

## 2019-11-18 NOTE — Progress Notes (Signed)
86 y.o. G72P2002 Widowed White or Caucasian female here for complaint of perineal itching that started in October.  Initially, she was prescribed clobetasol 0.05% cream.  After using this, she broke out in rash down right inner thigh.  Then, she was prescribed clotrimazole which she used on thigh and also in the external labia.  She reports using the steroid cream intermittently between November and early June.  She stopped using it at that time as didn't think it was helping.  Denies vaginal discharge or vaginal bleeding.  She does have urinary leakage.  She has used Always pads but now is using poise pads.  Thought maybe the Always pads were making her symptoms worse.  The Poise pads are unscented.    No LMP recorded. Patient is postmenopausal.          Sexually active: No.  The current method of family planning is post menopausal status.    Exercising: No.  exercise Smoker:  no  Health Maintenance: Pap:  unsure History of abnormal Pap:  yes MMG:  01-13-2019 category b density birads 1:neg Colonoscopy:  2015 BMD:   03/2019 TDaP:  2014 Pneumonia vaccine(s):  2016 Shingrix:   Zoster 2015 Hep C testing: unsure Screening Labs: n/a   reports that she has never smoked. She has never used smokeless tobacco. She reports that she does not drink alcohol and does not use drugs.  Past Medical History:  Diagnosis Date  . Abnormal Pap smear of cervix   . Allergy   . Arthritis   . Asthma   . Colon polyps    tubulovillous adenoma, 2010  . External hemorrhoids without mention of complication   . GERD (gastroesophageal reflux disease)   . Headache    eval with neuro in 2011  . Hyperlipemia   . Hyperparathyroidism [facility])    seeing Dr. Dwyane Dee in endocrinology  . Hypertension   . IBS (irritable bowel syndrome), GERD, hx colon polyps, chronic abd pain - followed by Dr. Sharlett Iles in GI 06/11/2012  . Osteoporosis 08/29/2011  . Vertigo    eval with neuro in 2011, brief recurrence 2016    Past Surgical  History:  Procedure Laterality Date  . BACK SURGERY  1974  . CARPAL TUNNEL RELEASE Left 06/2008  . COLONOSCOPY    . EYE SURGERY  07/2017   bilateral cataract extraction  . POLYPECTOMY    . TONSILLECTOMY    . UPPER GASTROINTESTINAL ENDOSCOPY      Current Outpatient Medications  Medication Sig Dispense Refill  . acetaminophen (TYLENOL) 500 MG tablet Take 500-1,000 mg by mouth every 6 (six) hours as needed for mild pain or headache.     . AMBULATORY NON FORMULARY MEDICATION GI cocktail: 90 ml 2% Lidocaine:74ml dicyclomine 10mg /5l:288ml Maalox - take 5-10 ml every 6 hours as needed. 240 mL 1  . cetirizine (ZYRTEC) 10 MG tablet Take 10 mg by mouth daily.    . chlorthalidone (HYGROTON) 25 MG tablet Take 0.5 tablets (12.5 mg total) by mouth daily. 45 tablet 3  . Cholecalciferol 25 MCG (1000 UT) TBDP Take 1,000 Units by mouth daily. 90 tablet 3  . cloNIDine (CATAPRES) 0.1 MG tablet Take 1 tablet (0.1 mg total) by mouth 2 (two) times daily. (Patient taking differently: Take 0.1 mg by mouth 2 (two) times daily. Half a tablet in the morning whole tablet in the evening.) 180 tablet 3  . esomeprazole (NEXIUM) 40 MG capsule Take 40 mg by mouth as needed.    . ezetimibe (ZETIA)  10 MG tablet TAKE 1 BY MOUTH DAILY 90 tablet 3  . linaclotide (LINZESS) 72 MCG capsule Take 1 capsule (72 mcg total) by mouth daily before breakfast. 30 capsule 5  . losartan (COZAAR) 100 MG tablet Take 1 tablet (100 mg total) by mouth at bedtime. 90 tablet 3  . rosuvastatin (CRESTOR) 5 MG tablet Take 1/2 tablet by mouth daily. 45 tablet 3  . vitamin B-12 (CYANOCOBALAMIN) 1000 MCG tablet Take 1,000 mcg by mouth daily.     No current facility-administered medications for this visit.    Family History  Problem Relation Age of Onset  . Diabetes Brother   . Hypertension Brother   . Stroke Brother 83  . Hypertension Father   . Stroke Father   . Diabetes Sister   . Hypertension Sister   . Heart disease Sister        heart  failure  . CAD Mother        Died of MI at age 69  . Hypertension Mother   . CAD Brother        Died of MI at age 43  . Hypertension Brother   . Stroke Sister 80  . Hypertension Sister   . Stroke Brother 65  . Hypertension Brother     Review of Systems  Constitutional: Negative.   HENT: Negative.   Eyes: Negative.   Respiratory: Negative.   Cardiovascular: Negative.   Gastrointestinal: Negative.   Endocrine: Negative.   Genitourinary:       Vaginal itching, burning on legs  Musculoskeletal: Negative.   Skin: Negative.   Allergic/Immunologic: Negative.   Neurological: Negative.   Hematological: Negative.   Psychiatric/Behavioral: Negative.     Exam:   BP 140/80   Ht 5' (1.524 m)   Wt 160 lb (72.6 kg)   BMI 31.25 kg/m   Height: 5' (152.4 cm)  General appearance: alert, cooperative and appears stated age Head: Normocephalic, without obvious abnormality, atraumatic Skin: Inner thighs have a very slight eythematous, flat, lesion that is symmetric (this is a location where pt states symptoms have been but are now improved) Lymph nodes: No inguinal LAD noted Neurologic: Grossly normal  Pelvic: External genitalia:  Erythema and area of mild excoriation on external labia majora bilaterally and slightly above clitoris              Urethra:  normal appearing urethra with no masses, tenderness or lesions              Bartholins and Skenes: normal                 Vagina: normal appearing vagina with normal color and discharge, no lesions              Cervix: no lesions              Pap taken: No.  Biopsy of labia majora recommended.  Consent obtained.    Procedure:  Area cleansed with Betadine.  Sterile technique used throughout procedure.  Skin anesthestized with Lidocaine 1% plain; 1.75mL.  Lot:  [id number].  Exp:  04/2020.  58mm  punch biopsy used to obtain specimen.  Biopsy grasped with pick-ups and excised with scissors.  Adequate hemostasis obtained with silver nitrate  sticks.  Dressing was not applied.  Pt tolerated procedure well.  Chaperone, Royal Hawthorn, CMA, was present for exam.  A:  Vulvar and inner thigh irritation/burning/itcing that has been present for months, treated both with antifungal and steroids, improved  P:   Nuswab testing for yeast, vaginally and on vulva obtained Vulvar biopsy pending.  Results will be called to pt.

## 2019-11-18 NOTE — Patient Instructions (Signed)
Vulvar Biopsy Post-procedure Instructions  Discomfort or mild pain is ok..  You may take Ibuprofen, Aleve, or Tylenol for the cramping.  This should resolve within the next two to three days.    It's ok if you have some light spotting.  Heavy bleeding is not normal.    You need to call the office if you have any pelvic pain, fever, heavy bleeding, or increased tenderness or discharge.  Shower or bathe as normal  You will be notified within one week of your biopsy results or we will discuss your results at your follow-up appointment if needed.  You may use some topical neosporin ointment if you want.

## 2019-11-20 LAB — C ALBICANS + C GLABRATA, NAA
Candida albicans, NAA: NEGATIVE
Candida glabrata, NAA: NEGATIVE

## 2019-11-21 LAB — SURGICAL PATHOLOGY

## 2019-11-24 ENCOUNTER — Ambulatory Visit (INDEPENDENT_AMBULATORY_CARE_PROVIDER_SITE_OTHER): Payer: PPO

## 2019-11-24 ENCOUNTER — Other Ambulatory Visit: Payer: Self-pay

## 2019-11-24 DIAGNOSIS — Z0000 Encounter for general adult medical examination without abnormal findings: Secondary | ICD-10-CM | POA: Diagnosis not present

## 2019-11-24 NOTE — Progress Notes (Addendum)
Subjective:   Robin [name] is a 86 y.o. female who presents for Medicare Annual (Subsequent) preventive examination.  I connected with Michele Mcalpine today by telephone and verified that I am speaking with the correct person using two identifiers. Location patient: home Location provider: work Persons participating in the virtual visit: patient, provider.   I discussed the limitations, risks, security and privacy concerns of performing an evaluation and management service by telephone and the availability of in person appointments. I also discussed with the patient that there may be a patient responsible charge related to this service. The patient expressed understanding and verbally consented to this telephonic visit.    Interactive audio and video telecommunications were attempted between this provider and patient, however failed, due to patient having technical difficulties OR patient did not have access to video capability.  We continued and completed visit with audio only.      Review of Systems    N/A Cardiac Risk Factors include: advanced age (>48men, >74 women)     Objective:    There were no vitals filed for this visit. There is no height or weight on file to calculate BMI.  Advanced Directives 11/24/2019 10/31/2018 07/15/2018 10/02/2017 02/13/2017 07/02/2015 09/30/2013  Does Patient Have a Medical Advance Directive? No Yes No Yes No No Patient does not have advance directive  Type of Advance Directive - Living will - Living will - - -  Does patient want to make changes to medical advance directive? - No - Patient declined - No - Patient declined - - -  Would patient like information on creating a medical advance directive? No - Patient declined - No - Patient declined - - No - patient declined information -  Pre-existing out of facility DNR order (yellow form or pink MOST form) - - - - - - No    Current Medications (verified) Outpatient Encounter Medications as of 11/24/2019   Medication Sig  . acetaminophen (TYLENOL) 500 MG tablet Take 500-1,000 mg by mouth every 6 (six) hours as needed for mild pain or headache.   . cetirizine (ZYRTEC) 10 MG tablet Take 10 mg by mouth daily.  . chlorthalidone (HYGROTON) 25 MG tablet Take 0.5 tablets (12.5 mg total) by mouth daily.  . Cholecalciferol 25 MCG (1000 UT) TBDP Take 1,000 Units by mouth daily.  . cloNIDine (CATAPRES) 0.1 MG tablet Take 1 tablet (0.1 mg total) by mouth 2 (two) times daily. (Patient taking differently: Take 0.1 mg by mouth 2 (two) times daily. Half a tablet in the morning whole tablet in the evening.)  . esomeprazole (NEXIUM) 40 MG capsule Take 40 mg by mouth as needed.  . ezetimibe (ZETIA) 10 MG tablet TAKE 1 BY MOUTH DAILY  . linaclotide (LINZESS) 72 MCG capsule Take 1 capsule (72 mcg total) by mouth daily before breakfast.  . losartan (COZAAR) 100 MG tablet Take 1 tablet (100 mg total) by mouth at bedtime.  . rosuvastatin (CRESTOR) 5 MG tablet Take 1/2 tablet by mouth daily.  . vitamin B-12 (CYANOCOBALAMIN) 1000 MCG tablet Take 1,000 mcg by mouth daily.  . AMBULATORY NON FORMULARY MEDICATION GI cocktail: 90 ml 2% Lidocaine:30ml dicyclomine 10mg /5l:279ml Maalox - take 5-10 ml every 6 hours as needed.   No facility-administered encounter medications on file as of 11/24/2019.    Allergies (verified) Prilosec [omeprazole], Amlodipine, Protonix [pantoprazole sodium], Amoxicillin, and Metronidazole   History: Past Medical History:  Diagnosis Date  . Abnormal Pap smear of cervix   . Allergy   .  Arthritis   . Asthma   . Colon polyps    tubulovillous adenoma, 2010  . External hemorrhoids without mention of complication   . GERD (gastroesophageal reflux disease)   . Headache    eval with neuro in 2011  . Hyperlipemia   . Hyperparathyroidism [facility])    seeing Dr. Dwyane Dee in endocrinology  . Hypertension   . IBS (irritable bowel syndrome), GERD, hx colon polyps, chronic abd pain - followed by Dr.  Sharlett Iles in GI 06/11/2012  . Osteoporosis 08/29/2011  . Vertigo    eval with neuro in 2011, brief recurrence 2016   Past Surgical History:  Procedure Laterality Date  . BACK SURGERY  1974  . CARPAL TUNNEL RELEASE Left 06/2008  . COLONOSCOPY    . EYE SURGERY  07/2017   bilateral cataract extraction  . POLYPECTOMY    . TONSILLECTOMY    . UPPER GASTROINTESTINAL ENDOSCOPY     Family History  Problem Relation Age of Onset  . Diabetes Brother   . Hypertension Brother   . Stroke Brother 62  . Hypertension Father   . Stroke Father   . Diabetes Sister   . Hypertension Sister   . Heart disease Sister        heart failure  . CAD Mother        Died of MI at age 13  . Hypertension Mother   . CAD Brother        Died of MI at age 5  . Hypertension Brother   . Stroke Sister 75  . Hypertension Sister   . Stroke Brother 64  . Hypertension Brother    Social History   Socioeconomic History  . Marital status: Widowed    Spouse name: Not on file  . Number of children: 2  . Years of education: Not on file  . Highest education level: Not on file  Occupational History  . 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 Smoking status: Never Smoker  . Smokeless tobacco: Never Used  Vaping Use  . Vaping Use: Never used  Substance and Sexual Activity  . Alcohol use: No    Alcohol/week: 0.0 standard drinks  . Drug use: No  . Sexual activity: Not Currently    Birth control/protection: Post-menopausal  Other Topics Concern  . Not on file  Social History Narrative   Lives alone in a one story home.  Has 2 children, one son and one daughter with 2 grandkids and 1 great-grandchild.    Retired from working for a bank.     Education: high school.    Social Determinants of Health   Financial Resource Strain: Low Risk   . Difficulty of Paying Living Expenses: Not hard at all  Food Insecurity: No Food Insecurity  . Worried About Sales executive in the Last Year: Never true  . Ran Out of Food in the Last Year: Never true  Transportation Needs: No Transportation Needs  . Lack of Transportation (Medical): No  . Lack of Transportation (Non-Medical): No  Physical Activity: Inactive  . Days of Exercise per Week: 0 days  . Minutes of Exercise per Session: 0 min  Stress: No Stress Concern Present  . Feeling of Stress : Not at all  Social Connections: Socially Isolated  . Frequency of Communication with Friends and Family: More than three times a week  . Frequency of Social Gatherings with Friends and Family: Once  a week  . Attends Religious Services: Never  . Active Member of Clubs or Organizations: No  . Attends Archivist Meetings: Never  . Marital Status: Widowed    Tobacco Counseling Counseling given: Not Answered   Clinical Intake:  Pre-visit preparation completed: Yes  Pain : No/denies pain     Nutritional Risks: None Diabetes: No  How often do you need to have someone help you when you read instructions, pamphlets, or other written materials from your doctor or pharmacy?: 1 - Never What is the last grade level you completed in school?: High School  Diabetic?No  Interpreter Needed?: No  Information entered by :: [place] of Daily Living In your present state of health, do you have any difficulty performing the following activities: 11/24/2019  Hearing? N  Vision? N  Difficulty concentrating or making decisions? N  Walking or climbing stairs? N  Dressing or bathing? N  Doing errands, shopping? N  Preparing Food and eating ? N  Using the Toilet? N  In the past six months, have you accidently leaked urine? Y  Comment Wears incontience pads only when going out  Do you have problems with loss of bowel control? N  Managing your Medications? N  Managing your Finances? N  Housekeeping or managing your Housekeeping? N  Some recent data might be hidden    Patient Care  Team: [name], MD as PCP - General (Family Medicine) [name] [name], MD as PCP - Cardiology (Cardiology) [name], MD as Consulting Physician (Gastroenterology) [name] [name], MD as Consulting Physician (Cardiology) [name], MD as Consulting Physician (Ophthalmology) [name], MD as Consulting Physician (Gynecology)  Indicate any recent Medical Services you may have received from other than [facility] providers in the past year (date may be approximate).     Assessment:   This is a routine wellness examination for Keila.  Hearing/Vision screen  Hearing Screening   125Hz  250Hz  500Hz  1000Hz  2000Hz  3000Hz  4000Hz  6000Hz  8000Hz   Right ear:           Left ear:           Vision Screening Comments: Gets eye exams annual   Dietary issues and exercise activities discussed: Current Exercise Habits: The patient does not participate in regular exercise at present, Exercise limited by: orthopedic condition(s)  Goals    . Exercise 150 minutes per week (moderate activity)     3 days- 40 minutes  May call the community center to see if they have a walking program and can designate a safe time to walk     . Patient Stated     Remain as independent as possible; keep free of infections    . Patient Stated     I will continue to take my medications as prescribed    . Patient Stated     I will try to drink more water daily       Depression Screen PHQ 2/9 Scores 11/24/2019 10/22/2018 07/15/2018 02/13/2017 12/06/2015 08/10/2014 07/07/2014  PHQ - 2 Score 0 0 0 0 0 0 0  PHQ- 9 Score 0 - 0 - - - -    Fall Risk Fall Risk  11/24/2019 07/15/2018 07/15/2018 02/13/2017 12/15/2016  Falls in the past year? 0 0 0 No No  Number falls in past yr: 0 - 0 - -  Injury with Fall? 0 - 0 - -  Risk for fall due to : Medication side effect - - - -  Follow up Falls evaluation completed;Falls prevention discussed - - - -    Any stairs in or around the home? No  If so, are there any without  handrails? No  Home free of loose throw rugs in walkways, pet beds, electrical cords, etc? Yes  Adequate lighting in your home to reduce risk of falls? Yes   ASSISTIVE DEVICES UTILIZED TO PREVENT FALLS:  Life alert? No  Use of a cane, walker or w/c? No  Grab bars in the bathroom? No  Shower chair or bench in shower? No  Elevated toilet seat or a handicapped toilet? No     Cognitive Function: MMSE - Mini Mental State Exam 02/13/2017  Not completed: (No Data)     6CIT Screen 11/24/2019  What Year? 0 points  What month? 0 points  What time? 0 points  Count back from 20 0 points  Months in reverse 0 points  Repeat phrase 0 points  Total Score 0    Immunizations Immunization History  Administered Date(s) Administered  . Fluad Quad(high Dose 65+) 03/04/2019  . Influenza, High Dose Seasonal PF 03/23/2015, 02/22/2016, 02/13/2017, 03/11/2018  . Influenza,inj,Quad PF,6+ Mos 03/07/2013, 03/03/2014  . Pneumococcal Conjugate-13 08/10/2014  . Pneumococcal Polysaccharide-23 07/23/2012  . Tdap 07/23/2012  . Zoster 09/30/2013    TDAP status: Up to date Flu Vaccine status: Up to date Pneumococcal vaccine status: Up to date Covid-19 vaccine status: Completed vaccines  Qualifies for Shingles Vaccine? Yes   Zostavax completed No   Shingrix Completed?: No.    Education has been provided regarding the importance of this vaccine. Patient has been advised to call insurance company to determine out of pocket expense if they have not yet received this vaccine. Advised may also receive vaccine at local pharmacy or Health Dept. Verbalized acceptance and understanding.  Screening Tests Health Maintenance  Topic Date Due  . COVID-19 Vaccine (1) Never done  . INFLUENZA VACCINE  12/14/2019  . TETANUS/TDAP  07/24/2022  . DEXA SCAN  Completed  . PNA vac Low Risk Adult  Completed    Health Maintenance  Health Maintenance Due  Topic Date Due  . COVID-19 Vaccine (1) Never done     Colorectal cancer screening: Completed 10/17/2013. Repeat every 10 years Mammogram status: Completed 01/13/2019. Repeat every year Bone Density status: Completed 03/18/2019. Results reflect: Bone density results: OSTEOPOROSIS. Repeat every 2 years.  Lung Cancer Screening: (Low Dose CT Chest recommended if Age 61-80 years, 30 pack-year currently smoking OR have quit w/in 15years.) does not qualify.   Lung Cancer Screening Referral: N/A  Additional Screening:  Hepatitis C Screening: does not qualify;   Vision Screening: Recommended annual ophthalmology exams for early detection of glaucoma and other disorders of the eye. Is the patient up to date with their annual eye exam?  Yes  Who is the provider or what is the name of the office in which the patient attends annual eye exams? [facility] Dr. Satira Sark If pt is not established with a provider, would they like to be referred to a provider to establish care? No .   Dental Screening: Recommended annual dental exams for proper oral hygiene  Community Resource Referral / Chronic Care Management: CRR required this visit?  No   CCM required this visit?  No      Plan:     I have personally reviewed and noted the following in the patient's chart:   . Medical and social history . Use of alcohol, tobacco or illicit drugs  .  Current medications and supplements . Functional ability and status . Nutritional status . Physical activity . Advanced directives . List of other physicians . Hospitalizations, surgeries, and ER visits in previous 12 months . Vitals . Screenings to include cognitive, depression, and falls . Referrals and appointments  In addition, I have reviewed and discussed with patient certain preventive protocols, quality metrics, and best practice recommendations. A written personalized care plan for preventive services as well as general preventive health recommendations were provided to patient.      Ofilia Neas, LPN   [id number]/[id number]   Nurse Notes: Patient would like to discuss getting the shingrix vaccine at next office visit.

## 2019-11-24 NOTE — Patient Instructions (Signed)
Ms. Robin [name] , Thank you for taking time to come for your Medicare Wellness Visit. I appreciate your ongoing commitment to your health goals. Please review the following plan we discussed and let me know if I can assist you in the future.   Screening recommendations/referrals: Colonoscopy: No longer required  Mammogram: Up to date, next due 01/13/2019 Bone Density: Up to date, next due 03/17/2021 Recommended yearly ophthalmology/optometry visit for glaucoma screening and checkup Recommended yearly dental visit for hygiene and checkup  Vaccinations: Influenza vaccine: Up to date, next due 12/2019 Pneumococcal vaccine: Completed series Tdap vaccine: Up to date, next due 07/24/2022 Shingles vaccine: Due, will discuss this at next in person office visit.    Advanced directives: Advance directive discussed with you today. Even though you declined this today please call our office should you change your mind and we can give you the proper paperwork for you to fill out.   Conditions/risks identified: Increase water intake daily, and try to include some exercise for at least 30 minutes 3x per week  Next appointment: None    Preventive Care 65 Years and Older, Female Preventive care refers to lifestyle choices and visits with your health care provider that can promote health and wellness. What does preventive care include?  A yearly physical exam. This is also called an annual well check.  Dental exams once or twice a year.  Routine eye exams. Ask your health care provider how often you should have your eyes checked.  Personal lifestyle choices, including:  Daily care of your teeth and gums.  Regular physical activity.  Eating a healthy diet.  Avoiding tobacco and drug use.  Limiting alcohol use.  Practicing safe sex.  Taking low-dose aspirin every day.  Taking vitamin and mineral supplements as recommended by your health care provider. What happens during an annual well  check? The services and screenings done by your health care provider during your annual well check will depend on your age, overall health, lifestyle risk factors, and family history of disease. Counseling  Your health care provider may ask you questions about your:  Alcohol use.  Tobacco use.  Drug use.  Emotional well-being.  Home and relationship well-being.  Sexual activity.  Eating habits.  History of falls.  Memory and ability to understand (cognition).  Work and work Statistician.  Reproductive health. Screening  You may have the following tests or measurements:  Height, weight, and BMI.  Blood pressure.  Lipid and cholesterol levels. These may be checked every 5 years, or more frequently if you are over 90 years old.  Skin check.  Lung cancer screening. You may have this screening every year starting at age 52 if you have a 30-pack-year history of smoking and currently smoke or have quit within the past 15 years.  Fecal occult blood test (FOBT) of the stool. You may have this test every year starting at age 39.  Flexible sigmoidoscopy or colonoscopy. You may have a sigmoidoscopy every 5 years or a colonoscopy every 10 years starting at age 46.  Hepatitis C blood test.  Hepatitis B blood test.  Sexually transmitted disease (STD) testing.  Diabetes screening. This is done by checking your blood sugar (glucose) after you have not eaten for a while (fasting). You may have this done every 1-3 years.  Bone density scan. This is done to screen for osteoporosis. You may have this done starting at age 36.  Mammogram. This may be done every 1-2 years. Talk to your health  care provider about how often you should have regular mammograms. Talk with your health care provider about your test results, treatment options, and if necessary, the need for more tests. Vaccines  Your health care provider may recommend certain vaccines, such as:  Influenza vaccine. This is  recommended every year.  Tetanus, diphtheria, and acellular pertussis (Tdap, Td) vaccine. You may need a Td booster every 10 years.  Zoster vaccine. You may need this after age 22.  Pneumococcal 13-valent conjugate (PCV13) vaccine. One dose is recommended after age 10.  Pneumococcal polysaccharide (PPSV23) vaccine. One dose is recommended after age 13. Talk to your health care provider about which screenings and vaccines you need and how often you need them. This information is not intended to replace advice given to you by your health care provider. Make sure you discuss any questions you have with your health care provider. Document Released: 05/28/2015 Document Revised: 01/19/2016 Document Reviewed: 03/02/2015 Elsevier Interactive Patient Education  2017 [place] Prevention in the Home Falls can cause injuries. They can happen to people of all ages. There are many things you can do to make your home safe and to help prevent falls. What can I do on the outside of my home?  Regularly fix the edges of walkways and driveways and fix any cracks.  Remove anything that might make you trip as you walk through a door, such as a raised step or threshold.  Trim any bushes or trees on the path to your home.  Use bright outdoor lighting.  Clear any walking paths of anything that might make someone trip, such as rocks or tools.  Regularly check to see if handrails are loose or broken. Make sure that both sides of any steps have handrails.  Any raised decks and porches should have guardrails on the edges.  Have any leaves, snow, or ice cleared regularly.  Use sand or salt on walking paths during winter.  Clean up any spills in your garage right away. This includes oil or grease spills. What can I do in the bathroom?  Use night lights.  Install grab bars by the toilet and in the tub and shower. Do not use towel bars as grab bars.  Use non-skid mats or decals in the tub or  shower.  If you need to sit down in the shower, use a plastic, non-slip stool.  Keep the floor dry. Clean up any water that spills on the floor as soon as it happens.  Remove soap buildup in the tub or shower regularly.  Attach bath mats securely with double-sided non-slip rug tape.  Do not have throw rugs and other things on the floor that can make you trip. What can I do in the bedroom?  Use night lights.  Make sure that you have a light by your bed that is easy to reach.  Do not use any sheets or blankets that are too big for your bed. They should not hang down onto the floor.  Have a firm chair that has side arms. You can use this for support while you get dressed.  Do not have throw rugs and other things on the floor that can make you trip. What can I do in the kitchen?  Clean up any spills right away.  Avoid walking on wet floors.  Keep items that you use a lot in easy-to-reach places.  If you need to reach something above you, use a strong step stool that has a grab  bar.  Keep electrical cords out of the way.  Do not use floor polish or wax that makes floors slippery. If you must use wax, use non-skid floor wax.  Do not have throw rugs and other things on the floor that can make you trip. What can I do with my stairs?  Do not leave any items on the stairs.  Make sure that there are handrails on both sides of the stairs and use them. Fix handrails that are broken or loose. Make sure that handrails are as long as the stairways.  Check any carpeting to make sure that it is firmly attached to the stairs. Fix any carpet that is loose or worn.  Avoid having throw rugs at the top or bottom of the stairs. If you do have throw rugs, attach them to the floor with carpet tape.  Make sure that you have a light switch at the top of the stairs and the bottom of the stairs. If you do not have them, ask someone to add them for you. What else can I do to help prevent  falls?  Wear shoes that:  Do not have high heels.  Have rubber bottoms.  Are comfortable and fit you well.  Are closed at the toe. Do not wear sandals.  If you use a stepladder:  Make sure that it is fully opened. Do not climb a closed stepladder.  Make sure that both sides of the stepladder are locked into place.  Ask someone to hold it for you, if possible.  Clearly mark and make sure that you can see:  Any grab bars or handrails.  First and last steps.  Where the edge of each step is.  Use tools that help you move around (mobility aids) if they are needed. These include:  Canes.  Walkers.  Scooters.  Crutches.  Turn on the lights when you go into a dark area. Replace any light bulbs as soon as they burn out.  Set up your furniture so you have a clear path. Avoid moving your furniture around.  If any of your floors are uneven, fix them.  If there are any pets around you, be aware of where they are.  Review your medicines with your doctor. Some medicines can make you feel dizzy. This can increase your chance of falling. Ask your doctor what other things that you can do to help prevent falls. This information is not intended to replace advice given to you by your health care provider. Make sure you discuss any questions you have with your health care provider. Document Released: 02/25/2009 Document Revised: 10/07/2015 Document Reviewed: 06/05/2014 Elsevier Interactive Patient Education  2017 [employer].

## 2019-12-12 ENCOUNTER — Other Ambulatory Visit: Payer: Self-pay | Admitting: Family Medicine

## 2019-12-12 DIAGNOSIS — Z1231 Encounter for screening mammogram for malignant neoplasm of breast: Secondary | ICD-10-CM

## 2019-12-17 ENCOUNTER — Ambulatory Visit: Payer: PPO | Admitting: Podiatry

## 2019-12-17 ENCOUNTER — Encounter: Payer: Self-pay | Admitting: Podiatry

## 2019-12-17 ENCOUNTER — Other Ambulatory Visit: Payer: Self-pay

## 2019-12-17 DIAGNOSIS — M722 Plantar fascial fibromatosis: Secondary | ICD-10-CM

## 2019-12-17 DIAGNOSIS — L600 Ingrowing nail: Secondary | ICD-10-CM

## 2019-12-17 NOTE — Progress Notes (Signed)
Subjective:   Patient ID: Robin [name], female   DOB: 86 y.o.   MRN: [id number]   HPI Patient presents stating of having a real flareup of the heel on my left and also concerned about discoloration of my big toenails left over right with some discomfort on the left toenail.  I do not remember doing anything that may have caused this and I do not smoke and like to be active   Review of Systems  All other systems reviewed and are negative.       Objective:  Physical Exam Vitals and nursing note reviewed.  Constitutional:      Appearance: She is well-developed.  Pulmonary:     Effort: Pulmonary effort is normal.  Musculoskeletal:        General: Normal range of motion.  Skin:    General: Skin is warm.  Neurological:     Mental Status: She is alert.     Neurovascular status intact muscle strength found to be adequate range of motion was within normal limits.  Patient is found to have discomfort plantar heel left at the insertional point tendon calcaneus inflammation fluid around the medial band and is noted to have significant discoloration big toenail left partial on the right big toenail with discomfort medial border left hallux.  Patient has good digital perfusion     Assessment:  Acute plantar fasciitis left with fluid buildup and probable trauma to the hallux nails with possible ingrown component left big toe     Plan:  H&P all conditions reviewed x-rays reviewed.  Today I did sterile prep injected the plantar fascial left 3 mg Kenalog 5 mg Xylocaine discussed the nails and do not recommend treatment unless they should start to drain become painful or get loose.  Did advised on soaks

## 2019-12-22 NOTE — Progress Notes (Deleted)
GYNECOLOGY  VISIT  CC:   ***  HPI: 86 y.o. G57P2002 Widowed White or Caucasian female here for 43mth follow up of lichen sclerosus.  GYNECOLOGIC HISTORY: No LMP recorded. Patient is postmenopausal. Contraception: post menopausal Menopausal hormone therapy: none  Patient Active Problem List   Diagnosis Date Noted  . Osteoporosis of femur without pathological fracture 06/24/2019  . Dyspnea 02/03/2019  . Asthma 02/03/2019  . Vertigo 06/26/2017  . Hyperparathyroidism ([place]) 08/24/2014  . Slow transit constipation 01/13/2014  . IBS (irritable bowel syndrome), GERD, hx colon polyps, chronic abd pain - followed by Dr. Sharlett Iles in GI 06/11/2012  . Osteopenia 08/29/2011  . Vitamin D deficiency 08/29/2011  . GERD (gastroesophageal reflux disease) 11/23/2010  . Dyslipidemia 09/09/2007  . Hypertension, essential, benign 09/09/2007    Past Medical History:  Diagnosis Date  . Abnormal Pap smear of cervix   . Allergy   . Arthritis   . Asthma   . Colon polyps    tubulovillous adenoma, 2010  . External hemorrhoids without mention of complication   . GERD (gastroesophageal reflux disease)   . Headache    eval with neuro in 2011  . Hyperlipemia   . Hyperparathyroidism [facility])    seeing Dr. Dwyane Dee in endocrinology  . Hypertension   . IBS (irritable bowel syndrome), GERD, hx colon polyps, chronic abd pain - followed by Dr. Sharlett Iles in GI 06/11/2012  . Osteoporosis 08/29/2011  . Vertigo    eval with neuro in 2011, brief recurrence 2016    Past Surgical History:  Procedure Laterality Date  . BACK SURGERY  1974  . CARPAL TUNNEL RELEASE Left 06/2008  . COLONOSCOPY    . EYE SURGERY  07/2017   bilateral cataract extraction  . POLYPECTOMY    . TONSILLECTOMY    . UPPER GASTROINTESTINAL ENDOSCOPY      MEDS:   Current Outpatient Medications on File Prior to Visit  Medication Sig Dispense Refill  . acetaminophen (TYLENOL) 500 MG tablet Take 500-1,000 mg by mouth every 6 (six) hours as  needed for mild pain or headache.     . AMBULATORY NON FORMULARY MEDICATION GI cocktail: 90 ml 2% Lidocaine:28ml dicyclomine 10mg /5l:223ml Maalox - take 5-10 ml every 6 hours as needed. 240 mL 1  . cetirizine (ZYRTEC) 10 MG tablet Take 10 mg by mouth daily.    . chlorthalidone (HYGROTON) 25 MG tablet Take 0.5 tablets (12.5 mg total) by mouth daily. 45 tablet 3  . Cholecalciferol 25 MCG (1000 UT) TBDP Take 1,000 Units by mouth daily. 90 tablet 3  . cloNIDine (CATAPRES) 0.1 MG tablet Take 1 tablet (0.1 mg total) by mouth 2 (two) times daily. (Patient taking differently: Take 0.1 mg by mouth 2 (two) times daily. Half a tablet in the morning whole tablet in the evening.) 180 tablet 3  . esomeprazole (NEXIUM) 40 MG capsule Take 40 mg by mouth as needed.    . ezetimibe (ZETIA) 10 MG tablet TAKE 1 BY MOUTH DAILY 90 tablet 3  . linaclotide (LINZESS) 72 MCG capsule Take 1 capsule (72 mcg total) by mouth daily before breakfast. 30 capsule 5  . losartan (COZAAR) 100 MG tablet Take 1 tablet (100 mg total) by mouth at bedtime. 90 tablet 3  . rosuvastatin (CRESTOR) 5 MG tablet Take 1/2 tablet by mouth daily. 45 tablet 3  . vitamin B-12 (CYANOCOBALAMIN) 1000 MCG tablet Take 1,000 mcg by mouth daily.     No current facility-administered medications on file prior to visit.  ALLERGIES: Prilosec [omeprazole], Amlodipine, Protonix [pantoprazole sodium], Amoxicillin, and Metronidazole  Family History  Problem Relation Age of Onset  . Diabetes Brother   . Hypertension Brother   . Stroke Brother 37  . Hypertension Father   . Stroke Father   . Diabetes Sister   . Hypertension Sister   . Heart disease Sister        heart failure  . CAD Mother        Died of MI at age 80  . Hypertension Mother   . CAD Brother        Died of MI at age 52  . Hypertension Brother   . Stroke Sister 76  . Hypertension Sister   . Stroke Brother 50  . Hypertension Brother     SH:  ***  Review of Systems  PHYSICAL  EXAMINATION:    There were no vitals taken for this visit.    General appearance: alert, cooperative and appears stated age Neck: no adenopathy, supple, symmetrical, trachea midline and thyroid {CHL AMB PHY EX THYROID NORM DEFAULT:[phone]::"normal to inspection and palpation"} CV:  {Exam; heart brief:31539} Lungs:  {pe lungs ob:314451::"clear to auscultation, no wheezes, rales or rhonchi, symmetric air entry"} Breasts: {Exam; breast:13139::"normal appearance, no masses or tenderness"} Abdomen: soft, non-tender; bowel sounds normal; no masses,  no organomegaly Lymph:  no inguinal LAD noted  Pelvic: External genitalia:  no lesions              Urethra:  normal appearing urethra with no masses, tenderness or lesions              Bartholins and Skenes: normal                 Vagina: normal appearing vagina with normal color and discharge, no lesions              Cervix: {CHL AMB PHY EX CERVIX NORM DEFAULT:[id number]::"no lesions"}              Bimanual Exam:  Uterus:  {CHL AMB PHY EX UTERUS NORM [phone]::"normal size, contour, position, consistency, mobility, non-tender"}              Adnexa: {CHL AMB PHY EX ADNEXA NO MASS DEFAULT:[phone]::"no mass, fullness, tenderness"}              Rectovaginal: {yes no:314532}.  Confirms.              Anus:  normal sphincter tone, no lesions  Chaperone, ***Terence Lux, CMA, was present for exam.  Assessment: ***  Plan: ***   ~{NUMBERS; -10-45 JOINT ROM:10287} minutes spent with patient >50% of time was in face to face discussion of above.

## 2019-12-26 ENCOUNTER — Ambulatory Visit (INDEPENDENT_AMBULATORY_CARE_PROVIDER_SITE_OTHER): Payer: PPO | Admitting: Family Medicine

## 2019-12-26 ENCOUNTER — Other Ambulatory Visit: Payer: Self-pay

## 2019-12-26 ENCOUNTER — Encounter: Payer: Self-pay | Admitting: Family Medicine

## 2019-12-26 VITALS — BP 144/76 | HR 111 | Temp 98.5°F | Ht 60.0 in | Wt 161.8 lb

## 2019-12-26 DIAGNOSIS — I10 Essential (primary) hypertension: Secondary | ICD-10-CM

## 2019-12-26 DIAGNOSIS — R5383 Other fatigue: Secondary | ICD-10-CM

## 2019-12-26 DIAGNOSIS — E538 Deficiency of other specified B group vitamins: Secondary | ICD-10-CM

## 2019-12-26 MED ORDER — ATENOLOL 25 MG PO TABS
25.0000 mg | ORAL_TABLET | Freq: Every day | ORAL | 1 refills | Status: DC
Start: 2019-12-26 — End: 2020-01-28

## 2019-12-26 NOTE — Progress Notes (Signed)
Robin [name] DOB: [birth year] Encounter date: 12/26/2019  This is a 86 y.o. female who presents with Chief Complaint  Patient presents with  . Follow-up    History of present illness:  3-4 weeks ago noted that bp was gradually increasing when she was checking. Has been some highs in the morning that worried her - this morning gave herself 2 hours before checking 173/94. Noted one day that diastolic was up to 259. Feels so weak, no energy. Feels like she can hardly go. Still having drop offs after she takes the clonidine. Lowest she has seen systolic was 563. Taking pressures at different times of the day to try and regulate.   Swelling with amlodipine. Doesn't want the clonidine even in patch form. She is still taking the losartan. Doesn't feel like this is working for her.   Worries about getting dizzy; other meds have made her dizzy.   (on chart review, patient was dismissed from practice previously due to inability to take meds for bp and had list of over 6meds which she hasn't tolerated)  Hydralazine: heart racing Lisinopril: tolerated, but felt not working hctz:myalgias Chlorthalidone:currently tolerating Metoprolol:? Olmesartan: tolerated but felt not working Losartan: increased urination, feels not effective Spironolactone: ? Lasix: didn't tolerate doses over 40mg  due to severe cramping arms/legs. Clonidine: doesn't like rapid drop in pressures with medication. Feels tired.    Urinating at least 4 times/night. Thinks this is related to taking losartan at night.     Allergies  Allergen Reactions  . Prilosec [Omeprazole] Cough  . Amlodipine     Feet and ankle swelling  . Protonix [Pantoprazole Sodium] Swelling  . Amoxicillin Rash  . Metronidazole Rash   Current Meds  Medication Sig  . acetaminophen (TYLENOL) 500 MG tablet Take 500-1,000 mg by mouth every 6 (six) hours as needed for mild pain or headache.   . AMBULATORY NON FORMULARY MEDICATION GI cocktail: 90  ml 2% Lidocaine:47ml dicyclomine 10mg /5l:242ml Maalox - take 5-10 ml every 6 hours as needed.  . cetirizine (ZYRTEC) 10 MG tablet Take 10 mg by mouth daily.  . chlorthalidone (HYGROTON) 25 MG tablet Take 0.5 tablets (12.5 mg total) by mouth daily.  . Cholecalciferol 25 MCG (1000 UT) TBDP Take 1,000 Units by mouth daily.  . cloNIDine (CATAPRES) 0.1 MG tablet Take 1 tablet (0.1 mg total) by mouth 2 (two) times daily. (Patient taking differently: Take 0.1 mg by mouth 2 (two) times daily. Half a tablet in the morning whole tablet in the evening.)  . esomeprazole (NEXIUM) 40 MG capsule Take 40 mg by mouth as needed.  . ezetimibe (ZETIA) 10 MG tablet TAKE 1 BY MOUTH DAILY  . linaclotide (LINZESS) 72 MCG capsule Take 1 capsule (72 mcg total) by mouth daily before breakfast.  . rosuvastatin (CRESTOR) 5 MG tablet Take 1/2 tablet by mouth daily.  . vitamin B-12 (CYANOCOBALAMIN) 1000 MCG tablet Take 1,000 mcg by mouth daily.    Review of Systems  Constitutional: Positive for fatigue. Negative for chills and fever.  Respiratory: Negative for cough, chest tightness, shortness of breath and wheezing.   Cardiovascular: Negative for chest pain, palpitations and leg swelling.  Neurological: Positive for light-headedness (after clonidine) and headaches.    Objective:  BP (!) 144/76 (BP Location: Left Arm, Patient Position: Sitting, Cuff Size: Large)   Pulse (!) 111   Temp 98.5 F (36.9 C) (Oral)   Ht 5' (1.524 m)   Wt 161 lb 12.8 oz (73.4 kg)   BMI 31.60 kg/m  Weight: 161 lb 12.8 oz (73.4 kg)   BP Readings from Last 3 Encounters:  12/26/19 (!) 144/76  11/23/19 140/80  10/22/19 140/90   Wt Readings from Last 3 Encounters:  12/26/19 161 lb 12.8 oz (73.4 kg)  11/18/19 160 lb (72.6 kg)  10/22/19 162 lb 9.6 oz (73.8 kg)    Physical Exam Constitutional:      General: She is not in acute distress.    Appearance: She is well-developed.  Cardiovascular:     Rate and Rhythm: Normal rate and  regular rhythm.     Heart sounds: Murmur heard.  Crescendo decrescendo systolic murmur is present with a grade of 2/6.  No friction rub.  Pulmonary:     Effort: Pulmonary effort is normal. No respiratory distress.     Breath sounds: Normal breath sounds. No wheezing or rales.  Musculoskeletal:     Right lower leg: No edema.     Left lower leg: No edema.  Neurological:     Mental Status: She is alert and oriented to person, place, and time.  Psychiatric:        Behavior: Behavior normal.     Assessment/Plan  1. B12 deficiency - Vitamin B12; Future  2. Hypertension, unspecified type Significant history of labile hypertension, and inability to tolerate medications.  I did encourage her to consider clonidine patch, which I feel like would keep her blood pressure more steady.  This has been effective at lowering her blood pressure, but she wants to stop this medication altogether.  She no longer feels that the losartan is doing any good, but we are going to keep this on board for now.  We will work on titrating down the clonidine rather than abruptly stopping since we discussed that this can cause significant rebound hypertension.  I am adding atenolol.  This is a medication that she has not tried previously.  She prefers to take once daily medications if possible.  I do think we will need to reconsider other medications or combination does not seem to be controlling her very well.  I will check in with her on Monday to see how she is tolerating this new medication and we will continue to determine what changes need to be made at this point.  *She is not happy taking the losartan and does not feel it is doing anything for her.  We could consider switching to irbesartan, which she has not tried before, but may offer some additional potency.  *I would also consider Cardizem since amlodipine did seem to improve blood pressure, but she did not tolerated due to lower extremity swelling.  - CBC  with Differential/Platelet; Future - Comprehensive metabolic panel; Future  3. Fatigue, unspecified type I do feel that she will feel better if we can get her blood pressure more reliably stable.  We will continue to work on this and check in with her frequently.  I have urged her to call with any concerns regarding medication her blood pressures. - TSH; Future   Over 30 minutes spent in chart review, time with patient, discussion of previous medications.  I did try to review all medicines that she did not tolerate, but unfortunately I do not have documentation listing specific reasons for discontinuing.  We will have her continue to monitor pressures at home and adjust medications as tolerated/needed. Return in about 1 month (around 01/26/2020) for Chronic condition visit.    Micheline Rough, MD

## 2019-12-26 NOTE — Patient Instructions (Addendum)
Decrease the clonidine to 1/2 tab twice daily. Start the atenolol tonight. (would be ok to move the losartan to morning).   I will check in with you on Monday to see how you do with this new med.

## 2019-12-28 MED ORDER — CLONIDINE HCL 0.1 MG PO TABS: 0.0500 mg | ORAL_TABLET | Freq: Two times a day (BID) | ORAL | 3 refills | Status: DC

## 2019-12-29 ENCOUNTER — Telehealth: Payer: Self-pay | Admitting: *Deleted

## 2019-12-29 ENCOUNTER — Telehealth: Payer: Self-pay | Admitting: Obstetrics & Gynecology

## 2019-12-29 ENCOUNTER — Ambulatory Visit: Payer: Self-pay | Admitting: Obstetrics & Gynecology

## 2019-12-29 NOTE — Telephone Encounter (Signed)
Left a message for the pt to return my call.  

## 2019-12-29 NOTE — Telephone Encounter (Signed)
Patient called back and was informed of the message below.  Patient was advised and agreed to return a call to the office on Friday with BP and pulse information.

## 2019-12-29 NOTE — Telephone Encounter (Signed)
Please advise breaking atenolol in half. Please check back in with her on Friday morning to see how she does with this. I would like heart rate to come up to 60's. Let me know if this does not happen with change in dose.

## 2019-12-29 NOTE — Telephone Encounter (Signed)
Patient cancelled 1 month fu today because she is sick. Would like to reschedule for a Monday or Tuesday and did not want to wait until next available.

## 2019-12-29 NOTE — Progress Notes (Signed)
GYNECOLOGY  VISIT  HPI: 86 y.o. G52P2002 Widowed White or Caucasian female here for 1 month LS follow up.  Reports itching is much better but it has been this way and then when stops the medication, itching starts back.  States skin is looking much more normal.  Chronic nature of lichen sclerosus discussed including possibly being auto-immune mediated as well as related to menopause.  Estrogens do not help this condition so should stay with steroid treatment.  Also, reviewed results and discussed why this is not yeast and why she should not use OTC or prescription anti-fungal treatment unless has new testing showing yeast/candida being present.  Information on lichen sclerosus given to pt.  GYNECOLOGIC HISTORY: No LMP recorded. Patient is postmenopausal. Contraception: Postmenopausal Menopausal hormone therapy: none  Patient Active Problem List   Diagnosis Date Noted  . Osteoporosis of femur without pathological fracture 06/24/2019  . Dyspnea 02/03/2019  . Asthma 02/03/2019  . Vertigo 06/26/2017  . Hyperparathyroidism ([place]) 08/24/2014  . Slow transit constipation 01/13/2014  . IBS (irritable bowel syndrome), GERD, hx colon polyps, chronic abd pain - followed by Dr. Sharlett Iles in GI 06/11/2012  . Osteopenia 08/29/2011  . Vitamin D deficiency 08/29/2011  . GERD (gastroesophageal reflux disease) 11/23/2010  . Dyslipidemia 09/09/2007  . Hypertension, essential, benign 09/09/2007    Past Medical History:  Diagnosis Date  . Abnormal Pap smear of cervix   . Allergy   . Arthritis   . Asthma   . Colon polyps    tubulovillous adenoma, 2010  . External hemorrhoids without mention of complication   . GERD (gastroesophageal reflux disease)   . Headache    eval with neuro in 2011  . Hyperlipemia   . Hyperparathyroidism [facility] [place])    seeing Dr. Dwyane Dee in endocrinology  . Hypertension   . IBS (irritable bowel syndrome), GERD, hx colon polyps, chronic abd pain - followed by Dr. Sharlett Iles in GI  06/11/2012  . Osteoporosis 08/29/2011  . Vertigo    eval with neuro in 2011, brief recurrence 2016    Past Surgical History:  Procedure Laterality Date  . BACK SURGERY  1974  . CARPAL TUNNEL RELEASE Left 06/2008  . COLONOSCOPY    . EYE SURGERY  07/2017   bilateral cataract extraction  . POLYPECTOMY    . TONSILLECTOMY    . UPPER GASTROINTESTINAL ENDOSCOPY      MEDS:   Current Outpatient Medications on File Prior to Visit  Medication Sig Dispense Refill  . acetaminophen (TYLENOL) 500 MG tablet Take 500-1,000 mg by mouth every 6 (six) hours as needed for mild pain or headache.     . AMBULATORY NON FORMULARY MEDICATION GI cocktail: 90 ml 2% Lidocaine:62ml dicyclomine 10mg /5l:248ml Maalox - take 5-10 ml every 6 hours as needed. 240 mL 1  . atenolol (TENORMIN) 25 MG tablet Take 1 tablet (25 mg total) by mouth daily. 90 tablet 1  . cetirizine (ZYRTEC) 10 MG tablet Take 10 mg by mouth daily.    . chlorthalidone (HYGROTON) 25 MG tablet Take 0.5 tablets (12.5 mg total) by mouth daily. 45 tablet 3  . Cholecalciferol 25 MCG (1000 UT) TBDP Take 1,000 Units by mouth daily. 90 tablet 3  . cloNIDine (CATAPRES) 0.1 MG tablet Take 0.5 tablets (0.05 mg total) by mouth 2 (two) times daily. 180 tablet 3  . esomeprazole (NEXIUM) 40 MG capsule Take 40 mg by mouth as needed.    . ezetimibe (ZETIA) 10 MG tablet TAKE 1 BY MOUTH DAILY 90 tablet 3  .  linaclotide (LINZESS) 72 MCG capsule Take 1 capsule (72 mcg total) by mouth daily before breakfast. 30 capsule 5  . rosuvastatin (CRESTOR) 5 MG tablet Take 1/2 tablet by mouth daily. 45 tablet 3  . vitamin B-12 (CYANOCOBALAMIN) 1000 MCG tablet Take 1,000 mcg by mouth daily.    Marland Kitchen losartan (COZAAR) 100 MG tablet Take 1 tablet (100 mg total) by mouth at bedtime. 90 tablet 3   No current facility-administered medications on file prior to visit.    ALLERGIES: Prilosec [omeprazole], Amlodipine, Protonix [pantoprazole sodium], Amoxicillin, and Metronidazole  Family  History  Problem Relation Age of Onset  . Diabetes Brother   . Hypertension Brother   . Stroke Brother 91  . Hypertension Father   . Stroke Father   . Diabetes Sister   . Hypertension Sister   . Heart disease Sister        heart failure  . CAD Mother        Died of MI at age 80  . Hypertension Mother   . CAD Brother        Died of MI at age 43  . Hypertension Brother   . Stroke Sister 60  . Hypertension Sister   . Stroke Brother 36  . Hypertension Brother     SH:  Widowed, non smoker  Review of Systems  All other systems reviewed and are negative.   PHYSICAL EXAMINATION:    BP 136/80 (BP Location: Left Arm, Patient Position: Sitting, Cuff Size: Normal)   Pulse 64   Resp 12   Ht 5' (1.524 m)   Wt 159 lb (72.1 kg)   BMI 31.05 kg/m     General appearance: alert, cooperative and appears stated age Lymph:  no inguinal LAD noted  Pelvic: External genitalia:  no lesions, skin is back to normal in appearance (as well as lesion in left inner thigh as well)              Urethra:  normal appearing urethra with no masses, tenderness or lesions              Bartholins and Skenes: normal                 Vagina: normal appearing vagina with normal color and discharge, no   Anus: no skin lesions  Chaperone, Olene Floss, CMA, was present for exam.  Assessment: Lichen sclerosus, skin now normal in appearance  Plan: Will change now to triamcinolone 0.1% ointment twice weekly.  Pt aware this is maintenance and she should stay on this long term.  Also, d/w pt mometasone as possible maintenance therapy but this is not covered with her insurance plan so will use triamcinolone.   Pt knows to call if she has any worsening symptoms changing to twice weekly dosage.  Advised to call and give update in about a month.  She will need yearly vulvar recheck.  Can follow up here or with Dr. Ethlyn Gallery for this.    22 minutes total spent with pt

## 2019-12-29 NOTE — Telephone Encounter (Signed)
-----   Message from [name], MD sent at 12/28/2019  9:01 PM EDT ----- Please call on Monday; see how blood pressures looked through weekend. See how atenolol was tolerated. (and wish her happy birthday). Please let me know. I told her we will be in communication regularly as we try to sort through meds for her.

## 2019-12-29 NOTE — Telephone Encounter (Signed)
Spoke with patient. Patient reports she is experiencing vertigo today unable to drive, requesting to r/s 1 mo f/u for LS recheck. Denies any other symptoms. Reports LS is improved,  intermittent flare ups, still using clobetasol ointment as prescribed. Request to schedule on Mon or Tues.   OV r/s to 8/23 at 11:30am with Dr. Sabra Heck.   Routing to provider for final review. Patient is agreeable to disposition. Will close encounter.

## 2019-12-29 NOTE — Telephone Encounter (Signed)
Spoke with the pt and she stated she still complains of dizziness, pulse is still in the 50's and BP readings are as below: 8/14-AM-129/65-PM 145/72 8/15-AM-152/76 and 136/72--PM 133/74 and 161/76 8/16-AM 153/89 and 143/70  Message sent to PCP.

## 2020-01-05 ENCOUNTER — Encounter: Payer: Self-pay | Admitting: Obstetrics & Gynecology

## 2020-01-05 ENCOUNTER — Ambulatory Visit: Payer: PPO | Admitting: Obstetrics & Gynecology

## 2020-01-05 ENCOUNTER — Other Ambulatory Visit: Payer: Self-pay

## 2020-01-05 VITALS — BP 136/80 | HR 64 | Resp 12 | Ht 60.0 in | Wt 159.0 lb

## 2020-01-05 DIAGNOSIS — L900 Lichen sclerosus et atrophicus: Secondary | ICD-10-CM | POA: Diagnosis not present

## 2020-01-05 MED ORDER — TRIAMCINOLONE ACETONIDE 0.1 % EX OINT: 1.0000 "application " | TOPICAL_OINTMENT | CUTANEOUS | 3 refills | Status: DC

## 2020-01-05 NOTE — Patient Instructions (Signed)
Lichen Sclerosus Lichen sclerosus is a skin problem. It can happen on any part of the body, but it commonly involves the anal or genital areas. It can cause itching and discomfort in these areas. Treatment can help to control symptoms. When the genital area is affected, getting treatment is important because the condition can cause scarring that may lead to other problems. What are the causes? The cause of this condition is not known. It may be related to an overactive immune system or a lack of certain hormones. Lichen sclerosus is not an infection or a fungus, and it is not passed from one person to another (not contagious). What increases the risk? This condition is more likely to develop in women, usually after menopause. What are the signs or symptoms? Symptoms of this condition include:  Thin, wrinkled, white areas on the skin.  Thickened white areas on the skin.  Red and swollen patches (lesions) on the skin.  Tears or cracks in the skin.  Bruising.  Blood blisters.  Severe itching.  Pain, itching, or burning when urinating. Constipation is also common in people with lichen sclerosus. How is this diagnosed? This condition may be diagnosed with a physical exam. In some cases, a tissue sample (biopsy sample) may be removed to be looked at under a microscope. How is this treated? This condition is usually treated with medicated creams or ointments (topical steroids) that are applied over the affected areas. In some cases, treatment may also include medicines that are taken by mouth. Surgery may be needed in more severe cases that are causing problems such as scarring. Follow these instructions at home:  Take or use over-the-counter and prescription medicines only as told by your health care provider.  Use creams or ointments as told by your health care provider.  Do not scratch the affected areas of skin.  If you are a woman, be sure to keep the vaginal area as clean and dry  as possible.  Clean the affected area of skin gently with water. Avoid using rough towels or toilet paper.  Keep all follow-up visits as told by your health care provider. This is important. Contact a health care provider if:  You have increasing redness, swelling, or pain in the affected area.  You have fluid, blood, or pus coming from the affected area.  You have new lesions on your skin.  You have a fever.  You have pain during sex. Summary  Lichen sclerosus is a skin problem. When the genital area is affected, getting treatment is important because the condition can cause scarring that may lead to other problems.  This condition is usually treated with medicated creams or ointments (topical steroids) that are applied over the affected areas.  Take or use over-the-counter and prescription medicines only as told by your health care provider.  Contact a health care provider if you have new lesions on your skin, have pain during sex, or have increasing redness, swelling, or pain in the affected area.  Keep all follow-up visits as told by your health care provider. This is important. This information is not intended to replace advice given to you by your health care provider. Make sure you discuss any questions you have with your health care provider. Document Revised: 09/13/2017 Document Reviewed: 09/13/2017 Elsevier Patient Education  2020 [employer]  

## 2020-01-10 ENCOUNTER — Encounter: Payer: Self-pay | Admitting: Family Medicine

## 2020-01-21 NOTE — Telephone Encounter (Signed)
The patient would like for JoAnne to call her back to ask her some questions about her med change because it has made her sick.  Please advise

## 2020-01-21 NOTE — Telephone Encounter (Signed)
Spoke with the pt and she stated since changing the dose of Atenolol to the evening, she has has severe abdominal pain, GI upset and nausea for the past 4 days.  Patient also complains of leg cramps and questioned if this was due to the medication?  Message sent to PCP.

## 2020-01-22 NOTE — Telephone Encounter (Signed)
Patient called back to speak with JoAnne to find out what Dr. Ethlyn Gallery suggested about her medication.  Please advise

## 2020-01-23 NOTE — Telephone Encounter (Signed)
She is still having abdominal pain and GI upset. She is nauseated but not throwing up. She is taking this medication at meal time with apple sauce or a bite of mashed potatoes.   Her pressures are up and down, lower than higher  No fever/chills  She would like a call back today to have an update on what she needs to do or she said that she will have to find somewhere to go to get relieve.  Please advise

## 2020-01-23 NOTE — Telephone Encounter (Signed)
Sorry I got behind on messages!  *is she still having the abdominal pain, GI upset? I wouldn't think this would come from the atenolol, so curious to see if she is still feeling unwell. I especially wouldn't think it would be related to changing time of day with dosing. Is she taking medications with food when she takes them? If not, she could start doing this and move meds to meal time.   How are pressures looking?   Any fevers/chills?

## 2020-01-26 ENCOUNTER — Ambulatory Visit: Payer: PPO

## 2020-01-28 ENCOUNTER — Other Ambulatory Visit: Payer: Self-pay | Admitting: Family Medicine

## 2020-02-02 ENCOUNTER — Encounter: Payer: Self-pay | Admitting: Family Medicine

## 2020-02-02 ENCOUNTER — Other Ambulatory Visit: Payer: Self-pay

## 2020-02-02 ENCOUNTER — Ambulatory Visit (INDEPENDENT_AMBULATORY_CARE_PROVIDER_SITE_OTHER): Payer: PPO | Admitting: Family Medicine

## 2020-02-02 VITALS — BP 170/90 | HR 88 | Temp 98.3°F | Ht 60.0 in | Wt 161.0 lb

## 2020-02-02 DIAGNOSIS — I10 Essential (primary) hypertension: Secondary | ICD-10-CM | POA: Diagnosis not present

## 2020-02-02 DIAGNOSIS — Z23 Encounter for immunization: Secondary | ICD-10-CM

## 2020-02-02 DIAGNOSIS — R002 Palpitations: Secondary | ICD-10-CM

## 2020-02-02 DIAGNOSIS — R5383 Other fatigue: Secondary | ICD-10-CM | POA: Diagnosis not present

## 2020-02-02 DIAGNOSIS — E538 Deficiency of other specified B group vitamins: Secondary | ICD-10-CM | POA: Diagnosis not present

## 2020-02-02 MED ORDER — CHLORTHALIDONE 25 MG PO TABS: 25.0000 mg | ORAL_TABLET | Freq: Every day | ORAL | 1 refills | Status: DC

## 2020-02-02 NOTE — Progress Notes (Signed)
Robin [name] DOB: August 09, 1933 Encounter date: 02/02/2020  This is a 86 y.o. female who presents with No chief complaint on file.   History of present illness: First read this morning was 250 systolic. Most readings were in the 539-767 range systolic until the last few days - then in higher 140's - 150's/70-80.  She is feeling better, but not great. Can't really put her finger on what isn't great. Not feeling weakness. Nausea is better. Not light headed. Constipated (her normal). Appetite is so so.   Doesn't take linzess daily.  Taking both zetia and crestor daily. (taking half of crestor).   Still taking half tablet of chlorthalidone. Taking losartan in the morning as well.   Has had a few episodes where she had irregular heart rate noted on monitor and HR has been up - highest 110.    Allergies  Allergen Reactions  . Prilosec [Omeprazole] Cough  . Amlodipine     Feet and ankle swelling  . Protonix [Pantoprazole Sodium] Swelling  . Amoxicillin Rash  . Metronidazole Rash   Current Meds  Medication Sig  . acetaminophen (TYLENOL) 500 MG tablet Take 500-1,000 mg by mouth every 6 (six) hours as needed for mild pain or headache.   . AMBULATORY NON FORMULARY MEDICATION GI cocktail: 90 ml 2% Lidocaine:75ml dicyclomine 10mg /5l:217ml Maalox - take 5-10 ml every 6 hours as needed.  . cetirizine (ZYRTEC) 10 MG tablet Take 10 mg by mouth daily.  . chlorthalidone (HYGROTON) 25 MG tablet Take 0.5 tablets (12.5 mg total) by mouth daily.  . Cholecalciferol 25 MCG (1000 UT) TBDP Take 1,000 Units by mouth daily.  Marland Kitchen esomeprazole (NEXIUM) 40 MG capsule Take 40 mg by mouth as needed.  . ezetimibe (ZETIA) 10 MG tablet TAKE 1 BY MOUTH DAILY  . linaclotide (LINZESS) 72 MCG capsule Take 1 capsule (72 mcg total) by mouth daily before breakfast.  . rosuvastatin (CRESTOR) 5 MG tablet Take 1/2 tablet by mouth daily.  Marland Kitchen triamcinolone ointment (KENALOG) 0.1 % Apply 1 application topically 2 (two) times a  week.  . vitamin B-12 (CYANOCOBALAMIN) 1000 MCG tablet Take 1,000 mcg by mouth daily.    Review of Systems  Constitutional: Negative for chills, fatigue and fever.  Respiratory: Negative for cough, chest tightness, shortness of breath and wheezing.   Cardiovascular: Negative for chest pain, palpitations and leg swelling.    Objective:  Pulse 88   Temp 98.3 F (36.8 C) (Oral)   Ht 5' (1.524 m)   Wt 161 lb (73 kg)   BMI 31.44 kg/m   Weight: 161 lb (73 kg)   BP Readings from Last 3 Encounters:  01/05/20 136/80  12/26/19 (!) 144/76  11/23/19 140/80   Wt Readings from Last 3 Encounters:  02/02/20 161 lb (73 kg)  01/05/20 159 lb (72.1 kg)  12/26/19 161 lb 12.8 oz (73.4 kg)    Physical Exam Constitutional:      General: She is not in acute distress.    Appearance: She is well-developed.  Cardiovascular:     Rate and Rhythm: Normal rate and regular rhythm.     Heart sounds: Murmur heard.  Systolic murmur is present with a grade of 2/6.  No friction rub.  Pulmonary:     Effort: Pulmonary effort is normal. No respiratory distress.     Breath sounds: Normal breath sounds. No wheezing or rales.  Musculoskeletal:     Right lower leg: No edema.     Left lower leg: No edema.  Neurological:     Mental Status: She is alert and oriented to person, place, and time.  Psychiatric:        Behavior: Behavior normal.     Assessment/Plan  1. Palpitations Will get event monitor to further evaluate. She is getting these regularly enough we should be able to pick up. bloodwork today as well. Exam in office was normal.  - Cardiac event monitor; Future  2. Hypertension, essential, benign Elevated today in office, but numbers have been better at home. Increase chlorthalidone to whole tab (25mg ) daily. Keep losartan 100mg . Sees cardio next month. Will update me in 1 week with pressures.  - chlorthalidone (HYGROTON) 25 MG tablet; Take 1 tablet (25 mg total) by mouth daily.  Dispense: 90  tablet; Refill: 1  Return in about 2 months (around 04/03/2020) for Chronic condition visit.    Micheline Rough, MD

## 2020-02-02 NOTE — Addendum Note (Signed)
Addended by: Agnes Lawrence on: 02/02/2020 09:59 AM   Modules accepted: Orders

## 2020-02-03 LAB — CBC WITH DIFFERENTIAL/PLATELET
Absolute Monocytes: 481 cells/uL (ref 200–950)
Basophils Absolute: 33 cells/uL (ref 0–200)
Basophils Relative: 0.5 %
Eosinophils Absolute: 124 cells/uL (ref 15–500)
Eosinophils Relative: 1.9 %
HCT: 38.7 % (ref 35.0–45.0)
Hemoglobin: 12.7 g/dL (ref 11.7–15.5)
Lymphs Abs: 1307 cells/uL (ref 850–3900)
MCH: 30.9 pg (ref 27.0–33.0)
MCHC: 32.8 g/dL (ref 32.0–36.0)
MCV: 94.2 fL (ref 80.0–100.0)
MPV: 9.7 fL (ref 7.5–12.5)
Monocytes Relative: 7.4 %
Neutro Abs: 4557 cells/uL (ref 1500–7800)
Neutrophils Relative %: 70.1 %
Platelets: 322 10*3/uL (ref 140–400)
RBC: 4.11 10*6/uL (ref 3.80–5.10)
RDW: 12.4 % (ref 11.0–15.0)
Total Lymphocyte: 20.1 %
WBC: 6.5 10*3/uL (ref 3.8–10.8)

## 2020-02-03 LAB — COMPREHENSIVE METABOLIC PANEL
AG Ratio: 1.8 (calc) (ref 1.0–2.5)
ALT: 12 U/L (ref 6–29)
AST: 17 U/L (ref 10–35)
Albumin: 4.2 g/dL (ref 3.6–5.1)
Alkaline phosphatase (APISO): 68 U/L (ref 37–153)
BUN/Creatinine Ratio: 18 (calc) (ref 6–22)
BUN: 16 mg/dL (ref 7–25)
CO2: 29 mmol/L (ref 20–32)
Calcium: 11.4 mg/dL — ABNORMAL HIGH (ref 8.6–10.4)
Chloride: 100 mmol/L (ref 98–110)
Creat: 0.91 mg/dL — ABNORMAL HIGH (ref 0.60–0.88)
Globulin: 2.4 g/dL (calc) (ref 1.9–3.7)
Glucose, Bld: 97 mg/dL (ref 65–99)
Potassium: 3.3 mmol/L — ABNORMAL LOW (ref 3.5–5.3)
Sodium: 138 mmol/L (ref 135–146)
Total Bilirubin: 0.6 mg/dL (ref 0.2–1.2)
Total Protein: 6.6 g/dL (ref 6.1–8.1)

## 2020-02-03 LAB — VITAMIN B12: Vitamin B-12: 1636 pg/mL — ABNORMAL HIGH (ref 200–1100)

## 2020-02-03 LAB — TSH: TSH: 1.59 mIU/L (ref 0.40–4.50)

## 2020-02-05 MED ORDER — POTASSIUM CHLORIDE ER 10 MEQ PO TBCR: EXTENDED_RELEASE_TABLET | ORAL | 0 refills | Status: DC

## 2020-02-09 ENCOUNTER — Other Ambulatory Visit: Payer: Self-pay

## 2020-02-09 ENCOUNTER — Ambulatory Visit
Admission: RE | Admit: 2020-02-09 | Discharge: 2020-02-09 | Disposition: A | Discharge status: Home / Self Care | Payer: PPO | Source: Ambulatory Visit | Attending: Family Medicine | Admitting: Family Medicine

## 2020-02-09 ENCOUNTER — Other Ambulatory Visit: Payer: Self-pay | Admitting: Family Medicine

## 2020-02-09 DIAGNOSIS — Z1231 Encounter for screening mammogram for malignant neoplasm of breast: Secondary | ICD-10-CM | POA: Diagnosis not present

## 2020-02-09 MED ORDER — SPIRONOLACTONE 25 MG PO TABS: 25.0000 mg | ORAL_TABLET | Freq: Every day | ORAL | 1 refills | Status: DC

## 2020-02-10 ENCOUNTER — Encounter (INDEPENDENT_AMBULATORY_CARE_PROVIDER_SITE_OTHER): Payer: PPO

## 2020-02-10 DIAGNOSIS — R002 Palpitations: Secondary | ICD-10-CM

## 2020-03-02 NOTE — Progress Notes (Signed)
HPI: FUdizziness and hypertension. ABIs August 2010 normal. Stress echocardiogram February 2014 normal. MRI July 2018 showed chronic small vessel ischemic changes in the cerebral white matter and pons. MRA without significant vertebrobasilar disease. CTA showed normal circle of Willis. EEG also normal.Patient has chronic mild dizziness. Echocardiogram September 2020 showed normal LV function, grade 1 diastolic dysfunction, mild left atrial enlargement, mild aortic stenosis with mean gradient 10 mmHg.  CTA September 2020 showed no pulmonary embolus. There was note of coronary artery disease/aortic atherosclerosis.  Since last seen,she has some dyspnea on exertion.  No orthopnea or PND.  She has bilateral lower extremity edema.  She denies exertional chest pain or syncope.  Blood pressure has been running high.  Current Outpatient Medications  Medication Sig Dispense Refill  . acetaminophen (TYLENOL) 500 MG tablet Take 500-1,000 mg by mouth every 6 (six) hours as needed for mild pain or headache.     . AMBULATORY NON FORMULARY MEDICATION GI cocktail: 90 ml 2% Lidocaine:15ml dicyclomine 10mg /5l:26ml Maalox - take 5-10 ml every 6 hours as needed. 240 mL 1  . cetirizine (ZYRTEC) 10 MG tablet Take 10 mg by mouth daily.    . Cholecalciferol 25 MCG (1000 UT) TBDP Take 1,000 Units by mouth daily. 90 tablet 3  . esomeprazole (NEXIUM) 40 MG capsule Take 40 mg by mouth as needed.    . ezetimibe (ZETIA) 10 MG tablet TAKE 1 BY MOUTH DAILY 90 tablet 3  . linaclotide (LINZESS) 72 MCG capsule Take 1 capsule (72 mcg total) by mouth daily before breakfast. 30 capsule 5  . losartan (COZAAR) 100 MG tablet Take 1 tablet (100 mg total) by mouth at bedtime. 90 tablet 3  . rosuvastatin (CRESTOR) 5 MG tablet Take 1/2 tablet by mouth daily. 45 tablet 3  . spironolactone (ALDACTONE) 25 MG tablet Take 1 tablet (25 mg total) by mouth daily. 90 tablet 1  . triamcinolone ointment (KENALOG) 0.1 % Apply 1 application  topically 2 (two) times a week. 30 g 3  . vitamin B-12 (CYANOCOBALAMIN) 1000 MCG tablet Take 1,000 mcg by mouth daily.     No current facility-administered medications for this visit.     Past Medical History:  Diagnosis Date  . Abnormal Pap smear of cervix   . Allergy   . Arthritis   . Asthma   . Colon polyps    tubulovillous adenoma, 2010  . External hemorrhoids without mention of complication   . GERD (gastroesophageal reflux disease)   . Headache    eval with neuro in 2011  . Hyperlipemia   . Hyperparathyroidism [facility])    seeing Dr. Dwyane Dee in endocrinology  . Hypertension   . IBS (irritable bowel syndrome), GERD, hx colon polyps, chronic abd pain - followed by Dr. Sharlett Iles in GI 06/11/2012  . Osteoporosis 08/29/2011  . Vertigo    eval with neuro in 2011, brief recurrence 2016    Past Surgical History:  Procedure Laterality Date  . BACK SURGERY  1974  . CARPAL TUNNEL RELEASE Left 06/2008  . COLONOSCOPY    . EYE SURGERY  07/2017   bilateral cataract extraction  . POLYPECTOMY    . TONSILLECTOMY    . UPPER GASTROINTESTINAL ENDOSCOPY      Social History   Socioeconomic History  . Marital status: Widowed    Spouse name: Not on file  . Number of children: 2  . Years of education: Not on file  . Highest education level: Not on file  Occupational  History  . 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 Smoking status: Never Smoker  . Smokeless tobacco: Never Used  Vaping Use  . Vaping Use: Never used  Substance and Sexual Activity  . Alcohol use: No    Alcohol/week: 0.0 standard drinks  . Drug use: No  . Sexual activity: Not Currently    Birth control/protection: Post-menopausal  Other Topics Concern  . Not on file  Social History Narrative   Lives alone in a one story home.  Has 2 children, one son and one daughter with 2 grandkids and 1 great-grandchild.    Retired from working for a bank.     Education: high  school.    Social Determinants of Health   Financial Resource Strain: Low Risk   . Difficulty of Paying Living Expenses: Not hard at all  Food Insecurity: No Food Insecurity  . Worried About Charity fundraiser in the Last Year: Never true  . Ran Out of Food in the Last Year: Never true  Transportation Needs: No Transportation Needs  . Lack of Transportation (Medical): No  . Lack of Transportation (Non-Medical): No  Physical Activity: Inactive  . Days of Exercise per Week: 0 days  . Minutes of Exercise per Session: 0 min  Stress: No Stress Concern Present  . Feeling of Stress : Not at all  Social Connections: Socially Isolated  . Frequency of Communication with Friends and Family: More than three times a week  . Frequency of Social Gatherings with Friends and Family: Once a week  . Attends Religious Services: Never  . Active Member of Clubs or Organizations: No  . Attends Archivist Meetings: Never  . Marital Status: Widowed  Intimate Partner Violence: Not At Risk  . Fear of Current or Ex-Partner: No  . Emotionally Abused: No  . Physically Abused: No  . Sexually Abused: No    Family History  Problem Relation Age of Onset  . Diabetes Brother   . Hypertension Brother   . Stroke Brother 61  . Hypertension Father   . Stroke Father   . Diabetes Sister   . Hypertension Sister   . Heart disease Sister        heart failure  . CAD Mother        Died of MI at age 63  . Hypertension Mother   . CAD Brother        Died of MI at age 18  . Hypertension Brother   . Stroke Sister 90  . Hypertension Sister   . Stroke Brother 63  . Hypertension Brother     ROS: no fevers or chills, productive cough, hemoptysis, dysphasia, odynophagia, melena, hematochezia, dysuria, hematuria, rash, seizure activity, orthopnea, PND, pedal edema, claudication. Remaining systems are negative.  Physical Exam: Well-developed well-nourished in no acute distress.  Skin is warm and dry.    HEENT is normal.  Neck is supple.  Chest is clear to auscultation with normal expansion.  Cardiovascular exam is regular rate and rhythm.  Abdominal exam nontender or distended. No masses palpated. Extremities show 1+ ankle edema. neuro grossly intact  ECG-sinus rhythm at a rate of 94, left anterior fascicular block, left ventricular hypertrophy, cannot rule out septal infarct.  Personally reviewed  A/P  1 hypertension-patient's blood pressure is elevated; she also notes bilateral ankle edema.  I have added Lasix 20 mg every other day (she is hesitant as she has  had difficulties with Lasix in the past but is agreeable to every other day).  Check potassium and renal function in 1 week.  If her blood pressure continues to run high despite Lasix we will consider addition of labetalol.  2 presyncope-patient has a long history of chronic dizziness but no recent syncope.  Previous evaluation negative.  3 aortic stenosis-mild on most recent echo.  We will plan follow-up images September 2022.  4 coronary artery disease/aortic atherosclerosis-noted on CTA.  Continue statin.  5 hyperlipidemia-continue statin.  6 lower extremity edema-continue spironolactone and add Lasix as outlined above.  We discussed the importance of keeping her feet elevated, low-sodium diet and fluid restriction.  We also discussed compression hose.  Kirk Ruths, MD

## 2020-03-08 ENCOUNTER — Encounter: Payer: Self-pay | Admitting: Cardiology

## 2020-03-08 ENCOUNTER — Other Ambulatory Visit: Payer: Self-pay

## 2020-03-08 ENCOUNTER — Ambulatory Visit: Payer: PPO | Admitting: Cardiology

## 2020-03-08 VITALS — BP 164/92 | HR 94 | Ht 62.0 in | Wt 163.4 lb

## 2020-03-08 DIAGNOSIS — I10 Essential (primary) hypertension: Secondary | ICD-10-CM

## 2020-03-08 DIAGNOSIS — I350 Nonrheumatic aortic (valve) stenosis: Secondary | ICD-10-CM

## 2020-03-08 DIAGNOSIS — R609 Edema, unspecified: Secondary | ICD-10-CM

## 2020-03-08 MED ORDER — FUROSEMIDE 20 MG PO TABS: 20.0000 mg | ORAL_TABLET | ORAL | 3 refills | Status: DC

## 2020-03-08 NOTE — Patient Instructions (Signed)
Medication Instructions:   START FUROSEMIDE 20 MG ONCE EVERY OTHER DAY  *If you need a refill on your cardiac medications before your next appointment, please call your pharmacy*   Lab Work:  Your physician recommends that you return for lab work in: [place]  If you have labs (blood work) drawn today and your tests are completely normal, you will receive your results only by: Marland Kitchen MyChart Message (if you have MyChart) OR . A paper copy in the mail If you have any lab test that is abnormal or we need to change your treatment, we will call you to review the results.   Follow-Up: At [facility], you and your health needs are our priority.  As part of our continuing mission to provide you with exceptional heart care, we have created designated Provider Care Teams.  These Care Teams include your primary Cardiologist (physician) and Advanced Practice Providers (APPs -  Physician Assistants and Nurse Practitioners) who all work together to provide you with the care you need, when you need it.  We recommend signing up for the patient portal called "MyChart".  Sign up information is provided on this After Visit Summary.  MyChart is used to connect with patients for Virtual Visits (Telemedicine).  Patients are able to view lab/test results, encounter notes, upcoming appointments, etc.  Non-urgent messages can be sent to your provider as well.   To learn more about what you can do with MyChart, go to [email or web address].    Your next appointment:   4 month(s)  The format for your next appointment:   In Person  Provider:   Kirk Ruths, MD

## 2020-03-15 MED ORDER — LABETALOL HCL 100 MG PO TABS: 100.0000 mg | ORAL_TABLET | Freq: Two times a day (BID) | ORAL | 3 refills | Status: DC

## 2020-03-15 NOTE — Telephone Encounter (Signed)
Results of monitor reviewed. She has occasional premature beats and brief nonsustained ventricular tachycardia. We will add labetalol 100 mg twice daily both for blood pressure and premature beats. Follow blood pressure.  Kirk Ruths

## 2020-03-16 ENCOUNTER — Encounter: Payer: Self-pay | Admitting: Physician Assistant

## 2020-03-16 ENCOUNTER — Ambulatory Visit: Payer: PPO | Admitting: Physician Assistant

## 2020-03-16 VITALS — BP 194/98 | HR 95 | Ht 62.0 in | Wt 158.0 lb

## 2020-03-16 DIAGNOSIS — R0989 Other specified symptoms and signs involving the circulatory and respiratory systems: Secondary | ICD-10-CM

## 2020-03-16 DIAGNOSIS — R1013 Epigastric pain: Secondary | ICD-10-CM | POA: Diagnosis not present

## 2020-03-16 DIAGNOSIS — R609 Edema, unspecified: Secondary | ICD-10-CM | POA: Diagnosis not present

## 2020-03-16 DIAGNOSIS — K219 Gastro-esophageal reflux disease without esophagitis: Secondary | ICD-10-CM | POA: Diagnosis not present

## 2020-03-16 DIAGNOSIS — R198 Other specified symptoms and signs involving the digestive system and abdomen: Secondary | ICD-10-CM | POA: Diagnosis not present

## 2020-03-16 MED ORDER — ESOMEPRAZOLE MAGNESIUM 40 MG PO CPDR
40.0000 mg | DELAYED_RELEASE_CAPSULE | Freq: Two times a day (BID) | ORAL | 3 refills | Status: DC
Start: 2020-03-16 — End: 2021-03-09

## 2020-03-16 NOTE — Progress Notes (Signed)
Chief Complaint: "Stomach issues"  HPI:    Robin [name] is an 86 year old female with past medical history as listed below including reflux and IBS-C, known to Dr. Fuller Plan, who presents clinic today with a complaint of "stomach issues".    08/20/2019 patient seen in clinic and described that she continues with epigastric pain, heartburn and dysphagia regardless of Nexium and 40 mg daily. Also described abdominal discomfort which she thought was related to the fact that her Trulance and MiraLAX had stopped working. She is using milk of magnesia every 3 to 4 days. Refilled patient's GI cocktail. Provided patient with Plenvu bowel purge. Recommend the patient start Linzess 72 mcg before breakfast.    Today, the patient presents to clinic and tells me that she had been doing well with all of her stomach issues until they started her on Atenolol in August, for her heart and at that point she developed some lower pain and nausea, she stopped this medicine on September 11 and now continues with more of an epigastric burning sensation and some nausea and a feeling like "something is in my throat".  Tells me that nothing has ever gotten stuck as long as she "swallows enough".  Currently taking her Nexium 40 mg once a day.  Patient is due to start Labetalol from her cardiologist and is worried that this medicine will also irritate things more.    Currently patient's bowel movements are daily and soft solid, she is not even taking her Linzess    Denies fever, chills or weight loss.  Past Medical History:  Diagnosis Date  . Abnormal Pap smear of cervix   . Allergy   . Arthritis   . Asthma   . Colon polyps    tubulovillous adenoma, 2010  . External hemorrhoids without mention of complication   . GERD (gastroesophageal reflux disease)   . Headache    eval with neuro in 2011  . Hyperlipemia   . Hyperparathyroidism [facility])    seeing Dr. Dwyane Dee in endocrinology  . Hypertension   . IBS (irritable bowel  syndrome), GERD, hx colon polyps, chronic abd pain - followed by Dr. Sharlett Iles in GI 06/11/2012  . Osteoporosis 08/29/2011  . Vertigo    eval with neuro in 2011, brief recurrence 2016    Past Surgical History:  Procedure Laterality Date  . BACK SURGERY  1974  . CARPAL TUNNEL RELEASE Left 06/2008  . COLONOSCOPY    . EYE SURGERY  07/2017   bilateral cataract extraction  . POLYPECTOMY    . TONSILLECTOMY    . UPPER GASTROINTESTINAL ENDOSCOPY      Current Outpatient Medications  Medication Sig Dispense Refill  . acetaminophen (TYLENOL) 500 MG tablet Take 500-1,000 mg by mouth every 6 (six) hours as needed for mild pain or headache.     . AMBULATORY NON FORMULARY MEDICATION GI cocktail: 90 ml 2% Lidocaine:42ml dicyclomine 10mg /5l:246ml Maalox - take 5-10 ml every 6 hours as needed. 240 mL 1  . cetirizine (ZYRTEC) 10 MG tablet Take 10 mg by mouth daily.    . Cholecalciferol 25 MCG (1000 UT) TBDP Take 1,000 Units by mouth daily. 90 tablet 3  . esomeprazole (NEXIUM) 40 MG capsule Take 40 mg by mouth as needed.    . ezetimibe (ZETIA) 10 MG tablet TAKE 1 BY MOUTH DAILY 90 tablet 3  . furosemide (LASIX) 20 MG tablet Take 1 tablet (20 mg total) by mouth every other day. 45 tablet 3  . labetalol (NORMODYNE) 100 MG  tablet Take 1 tablet (100 mg total) by mouth 2 (two) times daily. 180 tablet 3  . linaclotide (LINZESS) 72 MCG capsule Take 1 capsule (72 mcg total) by mouth daily before breakfast. 30 capsule 5  . rosuvastatin (CRESTOR) 5 MG tablet Take 1/2 tablet by mouth daily. 45 tablet 3  . spironolactone (ALDACTONE) 25 MG tablet Take 1 tablet (25 mg total) by mouth daily. 90 tablet 1  . triamcinolone ointment (KENALOG) 0.1 % Apply 1 application topically 2 (two) times a week. 30 g 3  . vitamin B-12 (CYANOCOBALAMIN) 1000 MCG tablet Take 1,000 mcg by mouth daily.    Marland Kitchen losartan (COZAAR) 100 MG tablet Take 1 tablet (100 mg total) by mouth at bedtime. 90 tablet 3   No current facility-administered  medications for this visit.    Allergies as of 03/16/2020 - Review Complete 03/16/2020  Allergen Reaction Noted  . Prilosec [omeprazole] Cough 08/16/2016  . Amlodipine  02/12/2019  . Protonix [pantoprazole sodium] Swelling 03/14/2016  . Amoxicillin Rash 04/28/2009  . Metronidazole Rash 04/28/2009    Family History  Problem Relation Age of Onset  . Diabetes Brother   . Hypertension Brother   . Stroke Brother 23  . Hypertension Father   . Stroke Father   . Diabetes Sister   . Hypertension Sister   . Heart disease Sister        heart failure  . CAD Mother        Died of MI at age 48  . Hypertension Mother   . CAD Brother        Died of MI at age 84  . Hypertension Brother   . Stroke Sister 33  . Hypertension Sister   . Stroke Brother 24  . Hypertension Brother   . Colon cancer Neg Hx   . Colon polyps Neg Hx   . Esophageal cancer Neg Hx   . Pancreatic cancer Neg Hx     Social History   Socioeconomic History  . Marital status: Widowed    Spouse name: Not on file  . Number of children: 2  . Years of education: Not on file  . Highest education level: Not on file  Occupational History  . 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 Smoking status: Never Smoker  . Smokeless tobacco: Never Used  Vaping Use  . Vaping Use: Never used  Substance and Sexual Activity  . Alcohol use: No    Alcohol/week: 0.0 standard drinks  . Drug use: No  . Sexual activity: Not Currently    Birth control/protection: Post-menopausal  Other Topics Concern  . Not on file  Social History Narrative   Lives alone in a one story home.  Has 2 children, one son and one daughter with 2 grandkids and 1 great-grandchild.    Retired from working for a bank.     Education: high school.    Social Determinants of Health   Financial Resource Strain: Low Risk   . Difficulty of Paying Living Expenses: Not hard at all  Food Insecurity: No Food Insecurity   . Worried About Charity fundraiser in the Last Year: Never true  . Ran Out of Food in the Last Year: Never true  Transportation Needs: No Transportation Needs  . Lack of Transportation (Medical): No  . Lack of Transportation (Non-Medical): No  Physical Activity: Inactive  . Days of Exercise per Week: 0 days  . Minutes  of Exercise per Session: 0 min  Stress: No Stress Concern Present  . Feeling of Stress : Not at all  Social Connections: Socially Isolated  . Frequency of Communication with Friends and Family: More than three times a week  . Frequency of Social Gatherings with Friends and Family: Once a week  . Attends Religious Services: Never  . Active Member of Clubs or Organizations: No  . Attends Archivist Meetings: Never  . Marital Status: Widowed  Intimate Partner Violence: Not At Risk  . Fear of Current or Ex-Partner: No  . Emotionally Abused: No  . Physically Abused: No  . Sexually Abused: No    Review of Systems:    Constitutional: No weight loss, fever or chills Cardiovascular: No chest pain  Respiratory: No SOB  Gastrointestinal: See HPI and otherwise negative   Physical Exam:  Vital signs: BP (!) 194/98   Pulse 95   Ht 5\' 2"  (1.575 m)   Wt 158 lb (71.7 kg)   BMI 28.90 kg/m   Constitutional:   Pleasant elderly Caucasian female appears to be in NAD, Well developed, Well nourished, alert and cooperative Respiratory: Respirations even and unlabored. Lungs clear to auscultation bilaterally.   No wheezes, crackles, or rhonchi.  Cardiovascular: Normal S1, S2. No MRG. Regular rate and rhythm. No peripheral edema, cyanosis or pallor.  Gastrointestinal:  Soft, nondistended, mild epigastric TTP. No rebound or guarding. Normal bowel sounds. No appreciable masses or hepatomegaly. Rectal:  Not performed.  Psychiatric: Demonstrates good judgement and reason without abnormal affect or behaviors.  Most recent LABS AND IMAGING: CBC    Component Value Date/Time    WBC 6.5 02/02/2020 1000   RBC 4.11 02/02/2020 1000   HGB 12.7 02/02/2020 1000   HCT 38.7 02/02/2020 1000   PLT 322 02/02/2020 1000   MCV 94.2 02/02/2020 1000   MCH 30.9 02/02/2020 1000   MCHC 32.8 02/02/2020 1000   RDW 12.4 02/02/2020 1000   LYMPHSABS 1,307 02/02/2020 1000   MONOABS 0.4 09/15/2019 1303   EOSABS 124 02/02/2020 1000   BASOSABS 33 02/02/2020 1000    CMP     Component Value Date/Time   NA 138 02/02/2020 1000   NA 141 03/04/2019 1116   K 3.3 (L) 02/02/2020 1000   CL 100 02/02/2020 1000   CO2 29 02/02/2020 1000   GLUCOSE 97 02/02/2020 1000   BUN 16 02/02/2020 1000   BUN 18 03/04/2019 1116   CREATININE 0.91 (H) 02/02/2020 1000   CALCIUM 11.4 (H) 02/02/2020 1000   PROT 6.6 02/02/2020 1000   ALBUMIN 4.0 09/15/2019 1303   AST 17 02/02/2020 1000   ALT 12 02/02/2020 1000   ALKPHOS 82 09/15/2019 1303   BILITOT 0.6 02/02/2020 1000   GFRNONAA 57 (L) 03/04/2019 1116   GFRAA 66 03/04/2019 1116    Assessment: 1.  Epigastric pain: With below, history of GERD and gastritis, possibly irritated by recent medicine 2.  Globus sensation: Started about a month ago after using Atenolol; consider esophagitis+/-stricture versus other 3.  Constipation: Resolved for now, history of IBS-C  Plan: 1.  Discussed symptoms with the patient.  Would recommend that she increase her Nexium to 40 mg twice daily, 30-60 minutes before breakfast and dinner #180 with 3-year refill sent to her mail order prescriptions.  For now she can use the Nexium she has at home twice daily until she receives this prescription. 2.  Instructed the patient to contact me via MyChart in 2 weeks to let me  know how she is doing.  If she continues with pain or globus sensation then would recommend a swallow study with tablet for further evaluation before possibly proceeding with EGD. 3.  Patient to follow in clinic with [place] as needed in the future.  Ellouise Newer, PA-C [facility] 03/16/2020, 11:01  AM  Cc: [name], MD

## 2020-03-16 NOTE — Progress Notes (Signed)
Reviewed and agree with management plan.  Dariush Mcnellis T. Japleen Tornow, MD FACG [facility]  

## 2020-03-16 NOTE — Patient Instructions (Addendum)
If you are age 84 or older, your body mass index should be between 23-30. Your Body mass index is 28.9 kg/m. If this is out of the aforementioned range listed, please consider follow up with your Primary Care Provider.  If you are age 38 or younger, your body mass index should be between 19-25. Your Body mass index is 28.9 kg/m. If this is out of the aformentioned range listed, please consider follow up with your Primary Care Provider.   We have sent the following medications to your pharmacy for you to pick up at your convenience: Nexium 40 mg twice daily 30-60 minutes before breakfast and dinner.  Call back with an update in 2 weeks, ask for Koren Shiver, RN.

## 2020-03-17 LAB — BASIC METABOLIC PANEL
BUN/Creatinine Ratio: 17 (ref 12–28)
BUN: 14 mg/dL (ref 8–27)
CO2: 27 mmol/L (ref 20–29)
Calcium: 11.2 mg/dL — ABNORMAL HIGH (ref 8.7–10.3)
Chloride: 104 mmol/L (ref 96–106)
Creatinine, Ser: 0.84 mg/dL (ref 0.57–1.00)
GFR calc Af Amer: 74 mL/min/{1.73_m2} (ref >59)
GFR calc non Af Amer: 64 mL/min/{1.73_m2} (ref >59)
Glucose: 84 mg/dL (ref 65–99)
Potassium: 4.5 mmol/L (ref 3.5–5.2)
Sodium: 144 mmol/L (ref 134–144)

## 2020-03-29 DIAGNOSIS — Z961 Presence of intraocular lens: Secondary | ICD-10-CM | POA: Diagnosis not present

## 2020-03-29 DIAGNOSIS — H524 Presbyopia: Secondary | ICD-10-CM | POA: Diagnosis not present

## 2020-03-29 DIAGNOSIS — D3131 Benign neoplasm of right choroid: Secondary | ICD-10-CM | POA: Diagnosis not present

## 2020-03-29 DIAGNOSIS — H43813 Vitreous degeneration, bilateral: Secondary | ICD-10-CM | POA: Diagnosis not present

## 2020-04-05 ENCOUNTER — Encounter: Payer: Self-pay | Admitting: Family Medicine

## 2020-04-05 ENCOUNTER — Other Ambulatory Visit: Payer: Self-pay

## 2020-04-05 ENCOUNTER — Ambulatory Visit (INDEPENDENT_AMBULATORY_CARE_PROVIDER_SITE_OTHER): Payer: PPO | Admitting: Family Medicine

## 2020-04-05 VITALS — Temp 98.7°F | Ht 62.0 in | Wt 167.4 lb

## 2020-04-05 DIAGNOSIS — E213 Hyperparathyroidism, unspecified: Secondary | ICD-10-CM

## 2020-04-05 DIAGNOSIS — E785 Hyperlipidemia, unspecified: Secondary | ICD-10-CM | POA: Diagnosis not present

## 2020-04-05 DIAGNOSIS — R748 Abnormal levels of other serum enzymes: Secondary | ICD-10-CM | POA: Diagnosis not present

## 2020-04-05 DIAGNOSIS — I10 Essential (primary) hypertension: Secondary | ICD-10-CM

## 2020-04-05 MED ORDER — SPIRONOLACTONE 25 MG PO TABS: ORAL_TABLET | ORAL | 1 refills | Status: DC

## 2020-04-05 NOTE — Progress Notes (Signed)
Robin [name] DOB: 03-09-1934 Encounter date: 04/05/2020  This is a 86 y.o. female who presents with Chief Complaint  Patient presents with  . Follow-up    History of present illness:  HTN: Was put back on lasix at last cardio visit - makes her achy all over. Has helped with swelling, but doesn't feel good on this. Pressures are very high when she first gets up. Sometimes at night they are high. Numbers at home ranging from 133-172 (most in the 140's)/58-81. Feels fine within all these ranges.   HL: continues with crestor 2.5mg  daily. Tolerates this well.  Saw GI a couple of weeks ago for ongoing globus sensation, epigastric pain: recommended increase nexium to 40mg  BID and would consider EGD if not improving. She states she hasn't noted much improvement with this change. Just putting off next step with scoping. Feels like she will need some dilation in esophagus.     Allergies  Allergen Reactions  . Prilosec [Omeprazole] Cough  . Amlodipine     Feet and ankle swelling  . Protonix [Pantoprazole Sodium] Swelling  . Amoxicillin Rash  . Metronidazole Rash   No outpatient medications have been marked as taking for the 04/05/20 encounter (Office Visit) with [name], MD.    Review of Systems  Constitutional: Negative for chills, fatigue and fever.  Respiratory: Negative for cough, chest tightness, shortness of breath and wheezing.   Cardiovascular: Negative for chest pain, palpitations and leg swelling.  Gastrointestinal: Positive for abdominal pain.       Always with some abdominal discomfort, long standing. Doesn't feel that new nexium is working as well as old nexium she had.    Objective:  Temp 98.7 F (37.1 C) (Oral)   Ht 5\' 2"  (1.575 m)   Wt 167 lb 6.4 oz (75.9 kg)   BMI 30.62 kg/m   Weight: 167 lb 6.4 oz (75.9 kg)   BP Readings from Last 3 Encounters:  03/16/20 (!) 194/98  03/08/20 (!) 164/92  02/02/20 (!) 170/90   Wt Readings from Last 3  Encounters:  04/05/20 167 lb 6.4 oz (75.9 kg)  03/16/20 158 lb (71.7 kg)  03/08/20 163 lb 6.4 oz (74.1 kg)    Physical Exam Constitutional:      General: She is not in acute distress.    Appearance: She is well-developed.  Cardiovascular:     Rate and Rhythm: Normal rate and regular rhythm.     Heart sounds: Normal heart sounds. No murmur heard.  No friction rub.  Pulmonary:     Effort: Pulmonary effort is normal. No respiratory distress.     Breath sounds: Normal breath sounds. No wheezing or rales.  Musculoskeletal:     Right lower leg: No edema.     Left lower leg: No edema.  Neurological:     Mental Status: She is alert and oriented to person, place, and time.  Psychiatric:        Behavior: Behavior normal.     Assessment/Plan  1. Dyslipidemia Check with future bloodwork continue crestor 2.5mg  daily.  Continue with zetia 10mg  daily. Lipids have been stable. - Comprehensive metabolic panel; Future - Lipid panel; Future  2. Hypertension, essential, benign Still with poor control. She was better controlled on clonidine but really hated the side effects. She is tolerating spironolactone. I am going to work with her to gradually increase dose of this. Recheck bp x 3 all over 124 systolic today which is not unheard of when she checks at  home. I have asked her to update through mychart in a few weeks and we will closely follow up to recheck. Continue losartan 100mg  at bedtime, labetalol 100mg  BID, lasix 20mg  daily.  - CBC with Differential/Platelet; Future - Comprehensive metabolic panel; Future  3. Hyperparathyroidism (Ong) Numbers have been stable; recheck in Jan. - PTH, Intact and Calcium; Future  4. Elevated vitamin B12 level She has been holding since September; we will recheck with future bloodwork. - Vitamin B12; Future    Return in about 6 weeks (around 05/18/2020) for blood pressure recheck.     Micheline Rough, MD

## 2020-04-05 NOTE — Patient Instructions (Signed)
Please increase spironolactone at a pace that is tolerable for you. I would start with 1 tablet in morning and 1.5 at night, and if tolerating this increase to 1.5 tablet in morning and night. You can message me with how pressures look after this change and we can determine further adjustments. If lowest pressures are still in 130's and you are still having high pressure regularly in 150-160's, then please increase to 2 tablets (50mg ) spironolactone twice daily. Please let me know if any concerns in meanwhile.   We can get bloodwork at your next visit if you would like or you can complete ahead of time. Whatever is easiest for you.

## 2020-04-07 ENCOUNTER — Other Ambulatory Visit: Payer: Self-pay

## 2020-04-07 ENCOUNTER — Ambulatory Visit: Payer: PPO | Admitting: Podiatry

## 2020-04-07 ENCOUNTER — Encounter: Payer: Self-pay | Admitting: Podiatry

## 2020-04-07 ENCOUNTER — Ambulatory Visit (INDEPENDENT_AMBULATORY_CARE_PROVIDER_SITE_OTHER): Payer: PPO

## 2020-04-07 DIAGNOSIS — M722 Plantar fascial fibromatosis: Secondary | ICD-10-CM

## 2020-04-07 DIAGNOSIS — G629 Polyneuropathy, unspecified: Secondary | ICD-10-CM | POA: Diagnosis not present

## 2020-04-07 MED ORDER — TRIAMCINOLONE ACETONIDE 10 MG/ML IJ SUSP
10.0000 mg | Freq: Once | INTRAMUSCULAR | Status: AC
  Administered 2020-04-07: 10 mg

## 2020-04-08 NOTE — Progress Notes (Signed)
Subjective:   Patient ID: Robin [name], female   DOB: 86 y.o.   MRN: [id number]   HPI Patient states her left heel has been hurting her still and she is getting burning type pain in her forefoot of both feet but cannot describe the specific area.  States that is been a problem for her for a while but it is gradually become more aggravating over time   ROS      Objective:  Physical Exam  Moderate diminishment of neurological sensation bilateral with patient having had long-term diabetes with pain to the left heel that is intense at the medial band.  The forefoot is sore but it is vague and there is no specific area     Assessment:  Acute plantar fasciitis left with moderate possible metatarsalgia or possible neuropathic-like symptomatology forefoot bilateral     Plan:  H&P reviewed changes in shoe gear for the neuropathy and does wear orthotics which she will continue.  I recommended the consideration of gabapentin and I did educate her on this but she wants to hold off currently and I went ahead and I did sterile prep left and injected the plantar fascia 3 mg Kenalog 5 mg Xylocaine.  Patient will be seen back we may have to initiate a more aggressive treatment for possible neuropathy  X-rays indicate no significant change I did not note any forefoot arthritis or I did not note indication of stress fracture

## 2020-05-12 ENCOUNTER — Telehealth: Payer: Self-pay

## 2020-05-12 NOTE — Progress Notes (Deleted)
GYNECOLOGY  VISIT  CC:   ***  HPI: 86 y.o. G33P2002 Widowed White or Caucasian female here for vulvar itching & burning.     GYNECOLOGIC HISTORY: No LMP recorded. Patient is postmenopausal. Contraception: *** Menopausal hormone therapy: ***  Patient Active Problem List   Diagnosis Date Noted  . Osteoporosis of femur without pathological fracture 06/24/2019  . Dyspnea 02/03/2019  . Asthma 02/03/2019  . Vertigo 06/26/2017  . Hyperparathyroidism ([place]) 08/24/2014  . Slow transit constipation 01/13/2014  . IBS (irritable bowel syndrome), GERD, hx colon polyps, chronic abd pain - followed by Dr. Sharlett Iles in GI 06/11/2012  . Osteopenia 08/29/2011  . Vitamin D deficiency 08/29/2011  . GERD (gastroesophageal reflux disease) 11/23/2010  . Dyslipidemia 09/09/2007  . Hypertension, essential, benign 09/09/2007    Past Medical History:  Diagnosis Date  . Abnormal Pap smear of cervix   . Allergy   . Arthritis   . Asthma   . Colon polyps    tubulovillous adenoma, 2010  . External hemorrhoids without mention of complication   . GERD (gastroesophageal reflux disease)   . Headache    eval with neuro in 2011  . Hyperlipemia   . Hyperparathyroidism [facility])    seeing Dr. Dwyane Dee in endocrinology  . Hypertension   . IBS (irritable bowel syndrome), GERD, hx colon polyps, chronic abd pain - followed by Dr. Sharlett Iles in GI 06/11/2012  . Osteoporosis 08/29/2011  . Vertigo    eval with neuro in 2011, brief recurrence 2016    Past Surgical History:  Procedure Laterality Date  . BACK SURGERY  1974  . CARPAL TUNNEL RELEASE Left 06/2008  . COLONOSCOPY    . EYE SURGERY  07/2017   bilateral cataract extraction  . POLYPECTOMY    . TONSILLECTOMY    . UPPER GASTROINTESTINAL ENDOSCOPY      MEDS:   Current Outpatient Medications on File Prior to Visit  Medication Sig Dispense Refill  . acetaminophen (TYLENOL) 500 MG tablet Take 500-1,000 mg by mouth every 6 (six) hours as needed for mild pain  or headache.     . AMBULATORY NON FORMULARY MEDICATION GI cocktail: 90 ml 2% Lidocaine:22ml dicyclomine 10mg /5l:25ml Maalox - take 5-10 ml every 6 hours as needed. 240 mL 1  . cetirizine (ZYRTEC) 10 MG tablet Take 10 mg by mouth daily.    . Cholecalciferol 25 MCG (1000 UT) TBDP Take 1,000 Units by mouth daily. 90 tablet 3  . esomeprazole (NEXIUM) 40 MG capsule Take 1 capsule (40 mg total) by mouth 2 (two) times daily before a meal. 180 capsule 3  . ezetimibe (ZETIA) 10 MG tablet TAKE 1 BY MOUTH DAILY 90 tablet 3  . furosemide (LASIX) 20 MG tablet Take 1 tablet (20 mg total) by mouth every other day. 45 tablet 3  . labetalol (NORMODYNE) 100 MG tablet Take 1 tablet (100 mg total) by mouth 2 (two) times daily. 180 tablet 3  . linaclotide (LINZESS) 72 MCG capsule Take 1 capsule (72 mcg total) by mouth daily before breakfast. 30 capsule 5  . losartan (COZAAR) 100 MG tablet Take 1 tablet (100 mg total) by mouth at bedtime. 90 tablet 3  . rosuvastatin (CRESTOR) 5 MG tablet Take 1/2 tablet by mouth daily. 45 tablet 3  . spironolactone (ALDACTONE) 25 MG tablet Take 1 tablet in the morning and 2 tablets in the evening 270 tablet 1  . triamcinolone ointment (KENALOG) 0.1 % Apply 1 application topically 2 (two) times a week. 30 g 3  .  vitamin B-12 (CYANOCOBALAMIN) 1000 MCG tablet Take 1,000 mcg by mouth daily.     No current facility-administered medications on file prior to visit.    ALLERGIES: Prilosec [omeprazole], Amlodipine, Protonix [pantoprazole sodium], Amoxicillin, and Metronidazole  Family History  Problem Relation Age of Onset  . Diabetes Brother   . Hypertension Brother   . Stroke Brother 63  . Hypertension Father   . Stroke Father   . Diabetes Sister   . Hypertension Sister   . Heart disease Sister        heart failure  . CAD Mother        Died of MI at age 43  . Hypertension Mother   . CAD Brother        Died of MI at age 90  . Hypertension Brother   . Stroke Sister 56  .  Hypertension Sister   . Stroke Brother 27  . Hypertension Brother   . Colon cancer Neg Hx   . Colon polyps Neg Hx   . Esophageal cancer Neg Hx   . Pancreatic cancer Neg Hx     SH:  ***  Review of Systems  PHYSICAL EXAMINATION:    There were no vitals taken for this visit.    General appearance: alert, cooperative, no acute distress  CV:  {Exam; heart brief:31539} Lungs:  {pe lungs ob:314451::"clear to auscultation, no wheezes, rales or rhonchi, symmetric air entry"} Breasts: {Exam; breast:13139::"normal appearance, no masses or tenderness"} Abdomen: soft, non-tender; bowel sounds normal; no masses,  no organomegaly Lymph:  no inguinal LAD noted  Pelvic: External genitalia:  no lesions              Urethra:  normal appearing urethra with no masses, tenderness or lesions              Bartholins and Skenes: normal                 Vagina: normal appearing vagina               Cervix: {CHL AMB PHY EX CERVIX NORM DEFAULT:[phone]::"no lesions"}              Bimanual Exam:  Uterus:  {CHL AMB PHY EX UTERUS NORM [phone]::"normal size, contour, position, consistency, mobility, non-tender"}              Adnexa: {CHL AMB PHY EX ADNEXA NO MASS [phone]::"no mass, fullness, tenderness"}               Chaperone, ***, CMA, was present for exam.  Assessment: ***  Plan: ***   {NUMBERS; -10-45 JOINT ROM:10287} minutes of total time was spent for this patient encounter, including preparation, face-to-face counseling with the patient and coordination of care, and documentation of the encounter.

## 2020-05-12 NOTE — Telephone Encounter (Signed)
Patient is having irritation.

## 2020-05-12 NOTE — Telephone Encounter (Signed)
Spoke with patient. Patient has a Hx of lichen sclerosus. Patient reports external vulvar itching and burning, not relieved by Rx triamcinolone 0.1%. Denies any other GYN symptoms. Advised OV recommended for further evaluation, patient agreeable. Patient declines OV offered for today, OV scheduled for 05/18/20 at 0900 w/ Clarita Crane, NP.  Routing to provider for final review. Patient is agreeable to disposition. Will close encounter.

## 2020-05-18 ENCOUNTER — Ambulatory Visit: Payer: Self-pay | Admitting: Nurse Practitioner

## 2020-05-21 NOTE — Progress Notes (Signed)
GYNECOLOGY  VISIT  CC:   Vaginal irritation  HPI: 87 y.o. G40P2002 Widowed White or Caucasian female here for vaginitis (vaginal itching & burning)  Dx lichen sclerosis confirmed via biopsy 11/2019, Dr. Sabra Heck originally started her on Clobetasol which initially helped. When she came back Dr. Sabra Heck changed to triamcinolone but does not know why. Does not remember any bad side effect. She thinks it may be because she had redness in the thighs, but that has been there about 1 year.  This has been an ongoing problem for her and has been to Dr. Ethlyn Gallery and then to Dr. Sabra Heck. She does not feel like it is has been normal in a long time but there are times when it gets much worse. It was worse last week when she called, very red and burning. Today is slightly better. Has not tried anything but Triamcinolone.      GYNECOLOGIC HISTORY: No LMP recorded. Patient is postmenopausal. Contraception: postmenopausal Menopausal hormone therapy: none  Patient Active Problem List   Diagnosis Date Noted  . Osteoporosis of femur without pathological fracture 06/24/2019  . Dyspnea 02/03/2019  . Asthma 02/03/2019  . Vertigo 06/26/2017  . Hyperparathyroidism ([place]) 08/24/2014  . Slow transit constipation 01/13/2014  . IBS (irritable bowel syndrome), GERD, hx colon polyps, chronic abd pain - followed by Dr. Sharlett Iles in GI 06/11/2012  . Osteopenia 08/29/2011  . Vitamin D deficiency 08/29/2011  . GERD (gastroesophageal reflux disease) 11/23/2010  . Dyslipidemia 09/09/2007  . Hypertension, essential, benign 09/09/2007    Past Medical History:  Diagnosis Date  . Abnormal Pap smear of cervix   . Allergy   . Arthritis   . Asthma   . Colon polyps    tubulovillous adenoma, 2010  . External hemorrhoids without mention of complication   . GERD (gastroesophageal reflux disease)   . Headache    eval with neuro in 2011  . Hyperlipemia   . Hyperparathyroidism [facility])    seeing Dr. Dwyane Dee in endocrinology  .  Hypertension   . IBS (irritable bowel syndrome), GERD, hx colon polyps, chronic abd pain - followed by Dr. Sharlett Iles in GI 06/11/2012  . Osteoporosis 08/29/2011  . Vertigo    eval with neuro in 2011, brief recurrence 2016    Past Surgical History:  Procedure Laterality Date  . BACK SURGERY  1974  . CARPAL TUNNEL RELEASE Left 06/2008  . COLONOSCOPY    . EYE SURGERY  07/2017   bilateral cataract extraction  . POLYPECTOMY    . TONSILLECTOMY    . UPPER GASTROINTESTINAL ENDOSCOPY      MEDS:   Current Outpatient Medications on File Prior to Visit  Medication Sig Dispense Refill  . acetaminophen (TYLENOL) 500 MG tablet Take 500-1,000 mg by mouth every 6 (six) hours as needed for mild pain or headache.     . cetirizine (ZYRTEC) 10 MG tablet Take 10 mg by mouth daily.    . Cholecalciferol 25 MCG (1000 UT) TBDP Take 1,000 Units by mouth daily. 90 tablet 3  . esomeprazole (NEXIUM) 40 MG capsule Take 1 capsule (40 mg total) by mouth 2 (two) times daily before a meal. 180 capsule 3  . ezetimibe (ZETIA) 10 MG tablet TAKE 1 BY MOUTH DAILY 90 tablet 3  . furosemide (LASIX) 20 MG tablet Take 1 tablet (20 mg total) by mouth every other day. 45 tablet 3  . labetalol (NORMODYNE) 100 MG tablet Take 1 tablet (100 mg total) by mouth 2 (two) times daily. 180 tablet  3  . losartan (COZAAR) 100 MG tablet Take 1 tablet (100 mg total) by mouth at bedtime. 90 tablet 3  . rosuvastatin (CRESTOR) 5 MG tablet Take 1/2 tablet by mouth daily. 45 tablet 3  . spironolactone (ALDACTONE) 25 MG tablet Take 1 tablet in the morning and 2 tablets in the evening 270 tablet 1  . triamcinolone ointment (KENALOG) 0.1 % Apply 1 application topically 2 (two) times a week. 30 g 3   No current facility-administered medications on file prior to visit.    ALLERGIES: Prilosec [omeprazole], Amlodipine, Protonix [pantoprazole sodium], Amoxicillin, and Metronidazole  Family History  Problem Relation Age of Onset  . Diabetes Brother    . Hypertension Brother   . Stroke Brother 66  . Hypertension Father   . Stroke Father   . Diabetes Sister   . Hypertension Sister   . Heart disease Sister        heart failure  . CAD Mother        Died of MI at age 39  . Hypertension Mother   . CAD Brother        Died of MI at age 66  . Hypertension Brother   . Stroke Sister 64  . Hypertension Sister   . Stroke Brother 76  . Hypertension Brother   . Colon cancer Neg Hx   . Colon polyps Neg Hx   . Esophageal cancer Neg Hx   . Pancreatic cancer Neg Hx     SH:  Not sexually active  Review of Systems  Constitutional: Negative.   HENT: Negative.   Eyes: Negative.   Respiratory: Negative.   Cardiovascular: Negative.   Gastrointestinal: Negative.   Endocrine: Negative.   Genitourinary:       Vaginal itching & burning  Musculoskeletal: Negative.   Skin: Negative.   Allergic/Immunologic: Negative.   Neurological: Negative.   Hematological: Negative.   Psychiatric/Behavioral: Negative.     PHYSICAL EXAMINATION:    BP 120/78   Pulse 68   Resp 16   Wt 158 lb (71.7 kg)   BMI 28.90 kg/m     General appearance: alert, cooperative, no acute distress Lymph:  no inguinal LAD noted  Pelvic: External genitalia: redness noted inner labia minora and at introitus, thin tissue              Urethra:  normal appearing urethra with no masses, tenderness or lesions              Bartholins and Skenes: normal                 Vagina: normal appearing vagina for age, atrophy, minimal white discharge noted, mild erythema         Chaperone, Joy, CMA, was present for exam.  Assessment/Plan:  Lichen sclerosus - Plan: clobetasol ointment (TEMOVATE) 0.05 %  Vulvar irritation - Plan: WET PREP FOR TRICH, YEAST, CLUE  Wet mount WNL Encouraged use of Replens vaginal moisturizer D/C triamcinolone, re-start clobetasol F/u 8 weeks for re-evaluation  25 minutes of total time was spent for this patient encounter, including preparation,  face-to-face counseling with the patient and coordination of care, and documentation of the encounter.

## 2020-05-25 ENCOUNTER — Other Ambulatory Visit: Payer: Self-pay

## 2020-05-25 ENCOUNTER — Encounter: Payer: Self-pay | Admitting: Nurse Practitioner

## 2020-05-25 ENCOUNTER — Ambulatory Visit: Payer: PPO | Admitting: Nurse Practitioner

## 2020-05-25 VITALS — BP 120/78 | HR 68 | Resp 16 | Wt 158.0 lb

## 2020-05-25 DIAGNOSIS — L900 Lichen sclerosus et atrophicus: Secondary | ICD-10-CM | POA: Diagnosis not present

## 2020-05-25 DIAGNOSIS — N9089 Other specified noninflammatory disorders of vulva and perineum: Secondary | ICD-10-CM | POA: Diagnosis not present

## 2020-05-25 LAB — WET PREP FOR TRICH, YEAST, CLUE

## 2020-05-25 MED ORDER — CLOBETASOL PROPIONATE 0.05 % EX OINT: 1.0000 "application " | TOPICAL_OINTMENT | CUTANEOUS | 0 refills | Status: DC

## 2020-05-25 NOTE — Patient Instructions (Signed)
REPLENS VAGINAL MOISTURIZER- Use an applicator full in vagina twice per week  Clobetasol- apply small amount to areas most irritated three times per week x 1week, Then back down to only twice per week x 8 weeks.  Then return for re-evaluation   Lichen Sclerosus Lichen sclerosus is a skin problem. It can happen on any part of the body. It happens most often in the areas around the anus or the genitals. It can cause itching and discomfort. Treatment can help to control symptoms. It can also help prevent scarring that may lead to other problems. What are the causes? The cause of this condition is not known. It is not passed from one person to another. What increases the risk?  Being a woman who has reached menopause.  Being a man who was not circumcised.  Being a child who is about to reach puberty. What are the signs or symptoms? Symptoms of this condition include:  Thin, wrinkled, white areas on the skin.  Thickened white areas on the skin.  Red and swollen patches on the skin.  Tears or cracks in the skin.  Bruising.  Blood blisters.  Very bad itching.  Pain, itching, or burning when peeing (urinating). Children are also most likely to have trouble pooping (constipation). Some adults too can have trouble pooping.   How is this treated? This condition may be treated with:  Creams or ointments (topical steroids) that are put on the skin in the affected areas. This is the most common treatment.  Medicines that are taken by mouth.  Topical immunotherapy. This is putting creams and ointments on the affected area. The creams and ointments will make your immunity strong in order to fight the infection. This is needed if steroids have not helped.  Surgery. This is only needed if the condition is very bad and is causing problems such as scarring. Follow these instructions at home: Medicines  Take over-the-counter and prescription medicines only as told by your health care  provider.  Use creams or ointments as told by your doctor. Skin care  Do not scratch the affected areas of skin.  If you are a woman, keep the vagina as clean and dry as you can.  Clean the affected area of skin gently with water only. Avoid using rough towels or toilet paper.  Avoid products that irritate the skin. These include soap and scented lotions. Your doctor will tell you what creams to use to treat itching. General instructions  Keep all follow-up visits.  You may need to take these actions to prevent or treat trouble pooping: ? Drink enough fluid to keep your pee (urine) pale yellow. ? Take over-the-counter or prescription medicines. ? Eat foods that are high in fiber. These include beans, whole grains, and fresh fruits and vegetables. ? Limit foods that are high in fat and sugar. These include fried or sweet foods. Contact a doctor if:  Your redness, swelling, or pain gets worse.  You have fluid, blood, or pus coming from the area.  You have new red and swollen patches on your skin.  You have a fever.  You have pain during sex. Get help right away if:  You have very bad pain or burning in the affected areas, especially in the area around your vagina, penis, or anus. Summary  Lichen sclerosus is a skin problem. It can cause itching and discomfort.  This condition is usually treated with creams or ointments that are put on the skin in the affected areas.  Use medicines only as told by your doctor.  Do not scratch the affected areas of skin.  Keep all follow-up visits. This information is not intended to replace advice given to you by your health care provider. Make sure you discuss any questions you have with your health care provider. Document Revised: 09/13/2019 Document Reviewed: 09/13/2019 Elsevier Patient Education  [place].

## 2020-05-26 ENCOUNTER — Telehealth: Payer: Self-pay

## 2020-05-26 ENCOUNTER — Other Ambulatory Visit: Payer: Self-pay | Admitting: Nurse Practitioner

## 2020-05-26 DIAGNOSIS — L900 Lichen sclerosus et atrophicus: Secondary | ICD-10-CM

## 2020-05-26 MED ORDER — CLOBETASOL PROPIONATE 0.05 % EX OINT: TOPICAL_OINTMENT | CUTANEOUS | 0 refills | Status: DC

## 2020-05-26 NOTE — Telephone Encounter (Addendum)
Claiborne Billings, NP, I was going to resend the Rx per note below. I notice there are two different set of instructions on it. One says twice weekly and one twice daily. If you tell me the correct directions I will correct that. thanks  Patient said she was in yesterday but Rx not yet at pharmacy. I do see where it was prescribed but the start date was for tomorrow 05/27/20 so it has not release to pharmacy yet. I went in and changed start date to be able to resend the Rx.

## 2020-05-27 NOTE — Telephone Encounter (Signed)
[name], NP sent Rx.  Patient informed, I left message in her voicemail.

## 2020-05-31 ENCOUNTER — Ambulatory Visit: Payer: PPO | Admitting: Family Medicine

## 2020-06-22 ENCOUNTER — Telehealth: Payer: Self-pay

## 2020-06-22 DIAGNOSIS — R0989 Other specified symptoms and signs involving the circulatory and respiratory systems: Secondary | ICD-10-CM

## 2020-06-22 DIAGNOSIS — R198 Other specified symptoms and signs involving the digestive system and abdomen: Secondary | ICD-10-CM

## 2020-06-22 NOTE — Telephone Encounter (Signed)
-----   Message from [place], [place] sent at 06/22/2020  1:13 PM EST ----- Regarding: Swallow study with tablet and ov Can you schedule patient for a swallow study with tablet and then an OV with me to follow that.  Thanks-JLL

## 2020-06-22 NOTE — Telephone Encounter (Signed)
Patient has been scheduled for a barium swallow study at [facility] on Thursday, 07/01/20 at 9:30 AM, patient will need to arrive at 9:15 AM. NPO after midnight.   Patient has been scheduled for a follow up with [facility] on Friday, 07/09/20 at 10:30 AM. Will notify patient of appointments via My Chart.

## 2020-07-01 ENCOUNTER — Ambulatory Visit (HOSPITAL_COMMUNITY)
Admission: RE | Admit: 2020-07-01 | Discharge: 2020-07-01 | Disposition: A | Discharge status: Home / Self Care | Payer: PPO | Source: Ambulatory Visit | Attending: Physician Assistant | Admitting: Physician Assistant

## 2020-07-01 ENCOUNTER — Other Ambulatory Visit: Payer: Self-pay

## 2020-07-01 DIAGNOSIS — R198 Other specified symptoms and signs involving the digestive system and abdomen: Secondary | ICD-10-CM | POA: Diagnosis not present

## 2020-07-01 DIAGNOSIS — R0989 Other specified symptoms and signs involving the circulatory and respiratory systems: Secondary | ICD-10-CM

## 2020-07-01 DIAGNOSIS — K449 Diaphragmatic hernia without obstruction or gangrene: Secondary | ICD-10-CM | POA: Diagnosis not present

## 2020-07-05 ENCOUNTER — Encounter: Payer: Self-pay | Admitting: Family Medicine

## 2020-07-05 ENCOUNTER — Other Ambulatory Visit: Payer: Self-pay

## 2020-07-05 ENCOUNTER — Ambulatory Visit (INDEPENDENT_AMBULATORY_CARE_PROVIDER_SITE_OTHER): Payer: PPO | Admitting: Family Medicine

## 2020-07-05 VITALS — BP 150/80 | HR 57 | Temp 98.1°F | Ht 62.0 in | Wt 156.7 lb

## 2020-07-05 DIAGNOSIS — E7800 Pure hypercholesterolemia, unspecified: Secondary | ICD-10-CM | POA: Diagnosis not present

## 2020-07-05 DIAGNOSIS — E559 Vitamin D deficiency, unspecified: Secondary | ICD-10-CM

## 2020-07-05 DIAGNOSIS — M545 Low back pain, unspecified: Secondary | ICD-10-CM

## 2020-07-05 DIAGNOSIS — I10 Essential (primary) hypertension: Secondary | ICD-10-CM

## 2020-07-05 DIAGNOSIS — E538 Deficiency of other specified B group vitamins: Secondary | ICD-10-CM

## 2020-07-05 DIAGNOSIS — R252 Cramp and spasm: Secondary | ICD-10-CM

## 2020-07-05 DIAGNOSIS — L659 Nonscarring hair loss, unspecified: Secondary | ICD-10-CM | POA: Diagnosis not present

## 2020-07-05 LAB — COMPREHENSIVE METABOLIC PANEL
ALT: 11 U/L (ref 0–35)
AST: 17 U/L (ref 0–37)
Albumin: 4.2 g/dL (ref 3.5–5.2)
Alkaline Phosphatase: 75 U/L (ref 39–117)
BUN: 14 mg/dL (ref 6–23)
CO2: 30 mEq/L (ref 19–32)
Calcium: 10.8 mg/dL — ABNORMAL HIGH (ref 8.4–10.5)
Chloride: 103 mEq/L (ref 96–112)
Creatinine, Ser: 0.8 mg/dL (ref 0.40–1.20)
GFR: 67.61 mL/min (ref >60.00)
Glucose, Bld: 84 mg/dL (ref 70–99)
Potassium: 4.3 mEq/L (ref 3.5–5.1)
Sodium: 140 mEq/L (ref 135–145)
Total Bilirubin: 0.7 mg/dL (ref 0.2–1.2)
Total Protein: 6.4 g/dL (ref 6.0–8.3)

## 2020-07-05 LAB — SEDIMENTATION RATE: Sed Rate: 11 mm/hr (ref 0–30)

## 2020-07-05 LAB — CBC WITH DIFFERENTIAL/PLATELET
Basophils Absolute: 0 10*3/uL (ref 0.0–0.1)
Basophils Relative: 0.7 % (ref 0.0–3.0)
Eosinophils Absolute: 0.1 10*3/uL (ref 0.0–0.7)
Eosinophils Relative: 2 % (ref 0.0–5.0)
HCT: 36.7 % (ref 36.0–46.0)
Hemoglobin: 12.2 g/dL (ref 12.0–15.0)
Lymphocytes Relative: 19.4 % (ref 12.0–46.0)
Lymphs Abs: 1.1 10*3/uL (ref 0.7–4.0)
MCHC: 33.2 g/dL (ref 30.0–36.0)
MCV: 91.5 fl (ref 78.0–100.0)
Monocytes Absolute: 0.4 10*3/uL (ref 0.1–1.0)
Monocytes Relative: 6.7 % (ref 3.0–12.0)
Neutro Abs: 4.2 10*3/uL (ref 1.4–7.7)
Neutrophils Relative %: 71.2 % (ref 43.0–77.0)
Platelets: 269 10*3/uL (ref 150.0–400.0)
RBC: 4.01 Mil/uL (ref 3.87–5.11)
RDW: 14.2 % (ref 11.5–15.5)
WBC: 5.9 10*3/uL (ref 4.0–10.5)

## 2020-07-05 LAB — LIPID PANEL
Cholesterol: 175 mg/dL (ref 0–200)
HDL: 61.6 mg/dL (ref >39.00)
LDL Cholesterol: 95 mg/dL (ref 0–99)
NonHDL: 113.63
Total CHOL/HDL Ratio: 3
Triglycerides: 92 mg/dL (ref 0.0–149.0)
VLDL: 18.4 mg/dL (ref 0.0–40.0)

## 2020-07-05 LAB — VITAMIN D 25 HYDROXY (VIT D DEFICIENCY, FRACTURES): VITD: 44.89 ng/mL (ref 30.00–100.00)

## 2020-07-05 LAB — IBC + FERRITIN
Ferritin: 22.6 ng/mL (ref 10.0–291.0)
Iron: 161 ug/dL — ABNORMAL HIGH (ref 42–145)
Saturation Ratios: 40.6 % (ref 20.0–50.0)
Transferrin: 283 mg/dL (ref 212.0–360.0)

## 2020-07-05 LAB — VITAMIN B12: Vitamin B-12: 203 pg/mL — ABNORMAL LOW (ref 211–911)

## 2020-07-05 LAB — TSH: TSH: 1.7 u[IU]/mL (ref 0.35–4.50)

## 2020-07-05 MED ORDER — LOSARTAN POTASSIUM 100 MG PO TABS: 100.0000 mg | ORAL_TABLET | Freq: Every day | ORAL | 3 refills | Status: DC

## 2020-07-05 MED ORDER — SPIRONOLACTONE 50 MG PO TABS: 50.0000 mg | ORAL_TABLET | Freq: Two times a day (BID) | ORAL | 3 refills | Status: DC

## 2020-07-05 NOTE — Patient Instructions (Signed)
*  We are going to increase the spironolactone to 50mg  twice daily (previously you had a 25mg  tab and took 1.5 tabs twice daily)  *take your losartan at bedtime (with second dose of labetalol)

## 2020-07-05 NOTE — Progress Notes (Signed)
Robin [name] DOB: October 28, 1933 Encounter date: 07/05/2020  This is a 87 y.o. female who presents with Chief Complaint  Patient presents with  . Follow-up    History of present illness: Last visit was 04/05/20 but follow up rescheduled due to weather.   Hypertension with poor control and multiple med intolerance: losartan 100mg , labetalol 100mg  BID, lasix 20mg , spironolactone. She feels about the same. No great days. bp higher in the morning. If she takes bp about an hour after she gets up it is up to 180 sometimes. Does feel much better than when she was on clonidine.   Following up with GI Friday. Just had barium study. She does have pretty consistent epigastric discomfort.   She is having leg cramps - not sure which medicine she started on when these started.   She is losing her hair - beautician noted and she is noticing more now.   Lower back pain. She is concerned this has something to do with kidneys - more achy.   Having a lot of dreams about deceased family members every night. Thinks related to medication changes.     Allergies  Allergen Reactions  . Prilosec [Omeprazole] Cough  . Amlodipine     Feet and ankle swelling  . Protonix [Pantoprazole Sodium] Swelling  . Amoxicillin Rash  . Metronidazole Rash   Current Meds  Medication Sig  . acetaminophen (TYLENOL) 500 MG tablet Take 500-1,000 mg by mouth every 6 (six) hours as needed for mild pain or headache.   . cetirizine (ZYRTEC) 10 MG tablet Take 10 mg by mouth daily.  . Cholecalciferol 25 MCG (1000 UT) TBDP Take 1,000 Units by mouth daily.  Marland Kitchen esomeprazole (NEXIUM) 40 MG capsule Take 1 capsule (40 mg total) by mouth 2 (two) times daily before a meal.  . ezetimibe (ZETIA) 10 MG tablet TAKE 1 BY MOUTH DAILY  . labetalol (NORMODYNE) 100 MG tablet Take 1 tablet (100 mg total) by mouth 2 (two) times daily.  . rosuvastatin (CRESTOR) 5 MG tablet Take 1/2 tablet by mouth daily.  Marland Kitchen spironolactone (ALDACTONE) 25 MG tablet  Take 1 tablet in the morning and 2 tablets in the evening    Review of Systems  Constitutional: Positive for fatigue. Negative for chills and fever.  Respiratory: Negative for cough, chest tightness, shortness of breath and wheezing.   Cardiovascular: Negative for chest pain, palpitations and leg swelling.  Neurological: Negative for dizziness and light-headedness (much better off clonidine).    Objective:  BP (!) 150/80 (BP Location: Left Arm, Patient Position: Sitting, Cuff Size: Large)   Pulse (!) 57   Temp 98.1 F (36.7 C) (Oral)   Ht 5\' 2"  (1.575 m)   Wt 156 lb 11.2 oz (71.1 kg)   SpO2 99%   BMI 28.66 kg/m   Weight: 156 lb 11.2 oz (71.1 kg)   BP Readings from Last 3 Encounters:  07/05/20 (!) 150/80  05/25/20 120/78  03/16/20 (!) 194/98   Wt Readings from Last 3 Encounters:  07/05/20 156 lb 11.2 oz (71.1 kg)  05/25/20 158 lb (71.7 kg)  04/05/20 167 lb 6.4 oz (75.9 kg)  see bp numbers scanned in from home readings - most 130-160/70-90. But these are after she has taken medications; prior to meds in morning pressures are routinely 170-180.  Physical Exam Constitutional:      General: She is not in acute distress.    Appearance: She is well-developed.  Cardiovascular:     Rate and Rhythm: Normal rate  and regular rhythm.     Heart sounds: Murmur heard.   Systolic murmur is present with a grade of 2/6. No friction rub.  Pulmonary:     Effort: Pulmonary effort is normal. No respiratory distress.     Breath sounds: Normal breath sounds. No wheezing or rales.  Musculoskeletal:     Right lower leg: No edema.     Left lower leg: No edema.  Neurological:     Mental Status: She is alert and oriented to person, place, and time.  Psychiatric:        Behavior: Behavior normal.     Assessment/Plan  1. Essential hypertension We are still working on control. I do not want her to drop low, but would like to see her with max bp systolic less than 258 if possible.  Have  encouraged her to try to take her losartan in the evening.  I am hoping this helps with morning pressures being lower.  We are going to increase her spironolactone to 50 mg twice daily.  Uncertain if current complaints are coming from current medications, but these medications seem to be much better tolerated than that she has been on in the past.  We will start with some blood work and consider changing pending these results. - spironolactone (ALDACTONE) 50 MG tablet; Take 1 tablet (50 mg total) by mouth 2 (two) times daily. Take 1 tablet in the morning and 2 tablets in the evening  Dispense: 180 tablet; Refill: 3 - losartan (COZAAR) 100 MG tablet; Take 1 tablet (100 mg total) by mouth at bedtime.  Dispense: 90 tablet; Refill: 3 - CBC with Differential/Platelet; Future - Comprehensive metabolic panel; Future - CBC with Differential/Platelet - Comprehensive metabolic panel  2. HYPERCHOLESTEROLEMIA Continue Crestor 2.5 mg daily. - Lipid panel; Future - TSH; Future - Lipid panel - TSH  3. Midline low back pain without sciatica, unspecified chronicity She has been following up with specialist for this.  She wanted to make sure not coming from her kidneys when we get blood work today.  4. Hair loss Potentially could be related to blood pressure medications, but also could have other causes.  We will start with blood work and consider med changes pending these results.  5. Muscle cramp See above.  Could be med related, electrolyte related.  Start with blood work. - Sedimentation rate; Future - IBC + Ferritin; Future - Magnesium, RBC; Future - Sedimentation rate - IBC + Ferritin - Magnesium, RBC  6. B12 deficiency - Vitamin B12; Future - Vitamin B12  7. Vitamin D deficiency - VITAMIN D 25 Hydroxy (Vit-D Deficiency, Fractures); Future - VITAMIN D 25 Hydroxy (Vit-D Deficiency, Fractures)   Return in about 3 months (around 10/02/2020) for Chronic condition visit.    Micheline Rough,  MD

## 2020-07-06 ENCOUNTER — Telehealth: Payer: Self-pay | Admitting: Family Medicine

## 2020-07-06 NOTE — Telephone Encounter (Signed)
Herbalist [facility]) - [place], [place] Phone:  [phone]  Fax:  [phone]      spironolactone (ALDACTONE) 50 MG tablet   The pharmacy called to get a clarification on the directions. There is 2 different directions on the Rx.

## 2020-07-07 ENCOUNTER — Other Ambulatory Visit: Payer: Self-pay | Admitting: Family Medicine

## 2020-07-07 DIAGNOSIS — I10 Essential (primary) hypertension: Secondary | ICD-10-CM

## 2020-07-07 MED ORDER — SPIRONOLACTONE 50 MG PO TABS: 50.0000 mg | ORAL_TABLET | Freq: Two times a day (BID) | ORAL | 3 refills | Status: DC

## 2020-07-07 NOTE — Telephone Encounter (Signed)
Gave instructions to [facility] for clarification - 50mg  PO BID; updated med list

## 2020-07-07 NOTE — Telephone Encounter (Signed)
[facility] called to get clarification for the directions on this Rx.  I spoke with Dr. Ethlyn Gallery and they were okay with me verifying it with Dr. Ethlyn Gallery and verbally telling them the directions are supposed to be, Take 1 tablet (50 mg total) by mouth 2 (two) times daily, so you don't have to send a new Rx.  Nothing further needed

## 2020-07-08 ENCOUNTER — Encounter: Payer: Self-pay | Admitting: Family Medicine

## 2020-07-08 LAB — MAGNESIUM, RBC: Magnesium RBC: 4.1 mg/dL (ref 4.0–6.4)

## 2020-07-08 NOTE — Progress Notes (Signed)
HPI: FUdizzinessand hypertension. ABIs August 2010 normal. Stress echocardiogram February 2014 normal. MRI July 2018 showed chronic small vessel ischemic changes in the cerebral white matter and pons. MRA without significant vertebrobasilar disease. CTA showed normal circle of Willis. EEG also normal.Patient has chronic mild dizziness.Echocardiogram September 2020 showed normal LV function, grade 1 diastolic dysfunction, mild left atrial enlargement, mild aortic stenosis with mean gradient 10 mmHg. CTA September 2020 showed no pulmonary embolus. There was note of coronary artery disease/aortic atherosclerosis. Monitor October 2021 showed sinus rhythm with PACs, PVCs and brief PAT. There was also 6 beats of nonsustained ventricular tachycardia. Since last seen,over the past 4 to 6 weeks she has had several syncopal/near syncopal episodes.  The first occurred while she was sitting.  She became "hot" and felt dizzy.  No preceding chest pain, palpitations or nausea.  She stood to walk to her bedroom and had syncope for several seconds.  2 subsequent episodes occurred 1 with standing and the other similar described as above with near syncope but not frank syncope.  Note her spironolactone was increased in February for hypertension.  Current Outpatient Medications  Medication Sig Dispense Refill  . acetaminophen (TYLENOL) 500 MG tablet Take 500-1,000 mg by mouth every 6 (six) hours as needed for mild pain or headache.     . cetirizine (ZYRTEC) 10 MG tablet Take 10 mg by mouth daily.    . Cholecalciferol 25 MCG (1000 UT) TBDP Take 1,000 Units by mouth daily. 90 tablet 3  . esomeprazole (NEXIUM) 40 MG capsule Take 1 capsule (40 mg total) by mouth 2 (two) times daily before a meal. 180 capsule 3  . ezetimibe (ZETIA) 10 MG tablet TAKE 1 BY MOUTH DAILY 90 tablet 3  . labetalol (NORMODYNE) 100 MG tablet Take 1 tablet (100 mg total) by mouth 2 (two) times daily. 180 tablet 3  . losartan (COZAAR) 100 MG  tablet Take 1 tablet (100 mg total) by mouth at bedtime. 90 tablet 3  . rosuvastatin (CRESTOR) 5 MG tablet Take 1/2 tablet by mouth daily. 45 tablet 3  . spironolactone (ALDACTONE) 50 MG tablet Take 1 tablet (50 mg total) by mouth 2 (two) times daily. 180 tablet 3  . furosemide (LASIX) 20 MG tablet Take 1 tablet (20 mg total) by mouth every other day. 45 tablet 3   No current facility-administered medications for this visit.     Past Medical History:  Diagnosis Date  . Abnormal Pap smear of cervix   . Allergy   . Arthritis   . Asthma   . Cataract 2018   bilateral eyes  . Colon polyps    tubulovillous adenoma, 2010  . External hemorrhoids without mention of complication   . GERD (gastroesophageal reflux disease)   . Headache    eval with neuro in 2011  . Hyperlipemia   . Hyperparathyroidism [facility])    seeing Dr. Dwyane Dee in endocrinology  . Hypertension   . IBS (irritable bowel syndrome), GERD, hx colon polyps, chronic abd pain - followed by Dr. Sharlett Iles in GI 06/11/2012  . Osteoporosis 08/29/2011  . Vertigo    eval with neuro in 2011, brief recurrence 2016    Past Surgical History:  Procedure Laterality Date  . BACK SURGERY  1974  . CARPAL TUNNEL RELEASE Left 06/2008  . COLONOSCOPY    . EYE SURGERY  07/2017   bilateral cataract extraction  . POLYPECTOMY    . TONSILLECTOMY    . UPPER GASTROINTESTINAL ENDOSCOPY  Social History   Socioeconomic History  . Marital status: Widowed    Spouse name: Not on file  . Number of children: 2  . Years of education: Not on file  . Highest education level: Not on file  Occupational History  . 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 Smoking status: Never Smoker  . Smokeless tobacco: Never Used  Vaping Use  . Vaping Use: Never used  Substance and Sexual Activity  . Alcohol use: No    Alcohol/week: 0.0 standard drinks  . Drug use: No  . Sexual activity: Not Currently    Birth  control/protection: Post-menopausal  Other Topics Concern  . Not on file  Social History Narrative   Lives alone in a one story home.  Has 2 children, one son and one daughter with 2 grandkids and 1 great-grandchild.    Retired from working for a bank.     Education: high school.    Social Determinants of Health   Financial Resource Strain: Low Risk   . Difficulty of Paying Living Expenses: Not hard at all  Food Insecurity: No Food Insecurity  . Worried About Charity fundraiser in the Last Year: Never true  . Ran Out of Food in the Last Year: Never true  Transportation Needs: No Transportation Needs  . Lack of Transportation (Medical): No  . Lack of Transportation (Non-Medical): No  Physical Activity: Inactive  . Days of Exercise per Week: 0 days  . Minutes of Exercise per Session: 0 min  Stress: No Stress Concern Present  . Feeling of Stress : Not at all  Social Connections: Socially Isolated  . Frequency of Communication with Friends and Family: More than three times a week  . Frequency of Social Gatherings with Friends and Family: Once a week  . Attends Religious Services: Never  . Active Member of Clubs or Organizations: No  . Attends Archivist Meetings: Never  . Marital Status: Widowed  Intimate Partner Violence: Not At Risk  . Fear of Current or Ex-Partner: No  . Emotionally Abused: No  . Physically Abused: No  . Sexually Abused: No    Family History  Problem Relation Age of Onset  . Diabetes Brother   . Hypertension Brother   . Stroke Brother 62  . Hypertension Father   . Stroke Father   . Diabetes Sister   . Hypertension Sister   . Heart disease Sister        heart failure  . CAD Mother        Died of MI at age 48  . Hypertension Mother   . CAD Brother        Died of MI at age 78  . Hypertension Brother   . Stroke Sister 72  . Hypertension Sister   . Stroke Brother 61  . Hypertension Brother   . Colon cancer Neg Hx   . Colon polyps Neg Hx    . Esophageal cancer Neg Hx   . Pancreatic cancer Neg Hx   . Stomach cancer Neg Hx   . Rectal cancer Neg Hx     ROS: no fevers or chills, productive cough, hemoptysis, dysphasia, odynophagia, melena, hematochezia, dysuria, hematuria, rash, seizure activity, orthopnea, PND, pedal edema, claudication. Remaining systems are negative.  Physical Exam: Well-developed well-nourished in no acute distress.  Skin is warm and dry.  HEENT is normal.  Neck is supple.  Chest is clear  to auscultation with normal expansion.  Cardiovascular exam is regular rate and rhythm.  2/6 systolic murmur left sternal border. Abdominal exam nontender or distended. No masses palpated. Extremities show no edema. neuro grossly intact  A/P  1 hypertension-patient's blood pressure is controlled today.  Continue present medications and follow.  2 history of aortic stenosis-plan follow-up echocardiogram.  3 coronary artery disease-noted on previous CTA.  Continue statin.  4 hyperlipidemia-continue statin.  5 dizziness-previous evaluation has been unrevealing and symptoms are unchanged.  6 lower extremity edema-symptoms are reasonably well controlled.  However she is having occasional syncope in the past 6 weeks that sounds potentially vagally mediated with contribution from orthostasis.  Decrease Lasix to 20 mg every other day as needed and spironolactone to 25 mg twice daily.  Hopefully her edema will not worsen.  7 syncope-etiology unclear though description sounds vagal in etiology with possible contribution from orthostasis.  Previous monitor unrevealing.  We will repeat echocardiogram.  Decrease diuretics as described above.  I have also asked her to maintain hydration.  I have asked her to not drive for 6 months.  May need implantable loop if symptoms recur.  Kirk Ruths, MD

## 2020-07-09 ENCOUNTER — Ambulatory Visit: Payer: PPO | Admitting: Physician Assistant

## 2020-07-09 ENCOUNTER — Other Ambulatory Visit: Payer: Self-pay

## 2020-07-09 ENCOUNTER — Encounter: Payer: Self-pay | Admitting: Physician Assistant

## 2020-07-09 VITALS — BP 164/84 | HR 57 | Ht 62.0 in | Wt 156.0 lb

## 2020-07-09 DIAGNOSIS — R933 Abnormal findings on diagnostic imaging of other parts of digestive tract: Secondary | ICD-10-CM | POA: Diagnosis not present

## 2020-07-09 DIAGNOSIS — R1013 Epigastric pain: Secondary | ICD-10-CM

## 2020-07-09 DIAGNOSIS — R0989 Other specified symptoms and signs involving the circulatory and respiratory systems: Secondary | ICD-10-CM

## 2020-07-09 DIAGNOSIS — R198 Other specified symptoms and signs involving the digestive system and abdomen: Secondary | ICD-10-CM | POA: Diagnosis not present

## 2020-07-09 NOTE — Patient Instructions (Signed)
If you are age 85 or older, your body mass index should be between 23-30. Your Body mass index is 28.53 kg/m. If this is out of the aforementioned range listed, please consider follow up with your Primary Care Provider.  If you are age 92 or younger, your body mass index should be between 19-25. Your Body mass index is 28.53 kg/m. If this is out of the aformentioned range listed, please consider follow up with your Primary Care Provider.   Nexium 40 mg twice daily   You have been scheduled for an endoscopy. Please follow written instructions given to you at your visit today. If you use inhalers (even only as needed), please bring them with you on the day of your procedure.  Thank you for choosing me and [facility].  Ellouise Newer, PA-C

## 2020-07-09 NOTE — Progress Notes (Signed)
Chief Complaint: Globus sensation and epigastric pain  HPI:    [name] is an 87 year old Caucasian female with a past medical history as listed below, known to Dr. Fuller Plan, who presents to clinic today for complaint of a globus sensation and epigastric pain.    08/20/2019 patient seen in clinic and described that she continues with epigastric pain, heartburn and dysphagia regardless of Nexium and 40 mg daily. Also described abdominal discomfort which she thought was related to the fact that her Trulance and MiraLAX had stopped working. She is using milk of magnesia every 3 to 4 days. Refilled patient's GI cocktail. Provided patient with Plenvu bowel purge. Recommend the patient start Linzess 72 mcg before breakfast     03/16/2020 patient seen in clinic and described she is doing well with all of her stomach issues until they started her on atenolol in August.  At that point started with some nausea and a feeling like "something is in my throat".  Increase Nexium to 40 mg twice daily.  Instructed her to let me know how she is doing in 2 weeks.    07/01/2020 barium swallow with tablet showed a small hiatal hernia and just above this was a 1.5 cm in diameter Schatzki ring.  Also occasional secondary and tertiary contractions.  Mild prominent cricopharyngeus muscle and aortic atherosclerosis.  Was recommended she have an EGD with Dr. Fuller Plan.    Today, the patient presents to clinic accompanied by her daughter who assists with history.  Her constipation is under control but she continues with epigastric issues.  Tells me that she always feels like something is in her throat and sometimes things feel like they get stuck when they are going down.  This is regardless of her using her Nexium 40 mg twice daily.    Denies fever, chills or blood in her stool.  Past Medical History:  Diagnosis Date  . Abnormal Pap smear of cervix   . Allergy   . Arthritis   . Asthma   . Colon polyps    tubulovillous adenoma,  2010  . External hemorrhoids without mention of complication   . GERD (gastroesophageal reflux disease)   . Headache    eval with neuro in 2011  . Hyperlipemia   . Hyperparathyroidism [facility])    seeing Dr. Dwyane Dee in endocrinology  . Hypertension   . IBS (irritable bowel syndrome), GERD, hx colon polyps, chronic abd pain - followed by Dr. Sharlett Iles in GI 06/11/2012  . Osteoporosis 08/29/2011  . Vertigo    eval with neuro in 2011, brief recurrence 2016    Past Surgical History:  Procedure Laterality Date  . BACK SURGERY  1974  . CARPAL TUNNEL RELEASE Left 06/2008  . COLONOSCOPY    . EYE SURGERY  07/2017   bilateral cataract extraction  . POLYPECTOMY    . TONSILLECTOMY    . UPPER GASTROINTESTINAL ENDOSCOPY      Current Outpatient Medications  Medication Sig Dispense Refill  . acetaminophen (TYLENOL) 500 MG tablet Take 500-1,000 mg by mouth every 6 (six) hours as needed for mild pain or headache.     . cetirizine (ZYRTEC) 10 MG tablet Take 10 mg by mouth daily.    . Cholecalciferol 25 MCG (1000 UT) TBDP Take 1,000 Units by mouth daily. 90 tablet 3  . esomeprazole (NEXIUM) 40 MG capsule Take 1 capsule (40 mg total) by mouth 2 (two) times daily before a meal. 180 capsule 3  . ezetimibe (ZETIA) 10 MG tablet  TAKE 1 BY MOUTH DAILY 90 tablet 3  . labetalol (NORMODYNE) 100 MG tablet Take 1 tablet (100 mg total) by mouth 2 (two) times daily. 180 tablet 3  . losartan (COZAAR) 100 MG tablet Take 1 tablet (100 mg total) by mouth at bedtime. 90 tablet 3  . rosuvastatin (CRESTOR) 5 MG tablet Take 1/2 tablet by mouth daily. 45 tablet 3  . spironolactone (ALDACTONE) 50 MG tablet Take 1 tablet (50 mg total) by mouth 2 (two) times daily. 180 tablet 3  . furosemide (LASIX) 20 MG tablet Take 1 tablet (20 mg total) by mouth every other day. 45 tablet 3   No current facility-administered medications for this visit.    Allergies as of 07/09/2020 - Review Complete 07/09/2020  Allergen Reaction Noted  .  Prilosec [omeprazole] Cough 08/16/2016  . Amlodipine  02/12/2019  . Protonix [pantoprazole sodium] Swelling 03/14/2016  . Amoxicillin Rash 04/28/2009  . Metronidazole Rash 04/28/2009    Family History  Problem Relation Age of Onset  . Diabetes Brother   . Hypertension Brother   . Stroke Brother 55  . Hypertension Father   . Stroke Father   . Diabetes Sister   . Hypertension Sister   . Heart disease Sister        heart failure  . CAD Mother        Died of MI at age 59  . Hypertension Mother   . CAD Brother        Died of MI at age 85  . Hypertension Brother   . Stroke Sister 69  . Hypertension Sister   . Stroke Brother 58  . Hypertension Brother   . Colon cancer Neg Hx   . Colon polyps Neg Hx   . Esophageal cancer Neg Hx   . Pancreatic cancer Neg Hx     Social History   Socioeconomic History  . Marital status: Widowed    Spouse name: Not on file  . Number of children: 2  . Years of education: Not on file  . Highest education level: Not on file  Occupational History  . 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 Smoking status: Never Smoker  . Smokeless tobacco: Never Used  Vaping Use  . Vaping Use: Never used  Substance and Sexual Activity  . Alcohol use: No    Alcohol/week: 0.0 standard drinks  . Drug use: No  . Sexual activity: Not Currently    Birth control/protection: Post-menopausal  Other Topics Concern  . Not on file  Social History Narrative   Lives alone in a one story home.  Has 2 children, one son and one daughter with 2 grandkids and 1 great-grandchild.    Retired from working for a bank.     Education: high school.    Social Determinants of Health   Financial Resource Strain: Low Risk   . Difficulty of Paying Living Expenses: Not hard at all  Food Insecurity: No Food Insecurity  . Worried About Charity fundraiser in the Last Year: Never true  . Ran Out of Food in the Last Year: Never true   Transportation Needs: No Transportation Needs  . Lack of Transportation (Medical): No  . Lack of Transportation (Non-Medical): No  Physical Activity: Inactive  . Days of Exercise per Week: 0 days  . Minutes of Exercise per Session: 0 min  Stress: No Stress Concern Present  . Feeling of Stress :  Not at all  Social Connections: Socially Isolated  . Frequency of Communication with Friends and Family: More than three times a week  . Frequency of Social Gatherings with Friends and Family: Once a week  . Attends Religious Services: Never  . Active Member of Clubs or Organizations: No  . Attends Archivist Meetings: Never  . Marital Status: Widowed  Intimate Partner Violence: Not At Risk  . Fear of Current or Ex-Partner: No  . Emotionally Abused: No  . Physically Abused: No  . Sexually Abused: No    Review of Systems:    Constitutional: No weight loss, fever or chills Cardiovascular: No chest pain Respiratory: No SOB  Gastrointestinal: See HPI and otherwise negative   Physical Exam:  Vital signs: BP (!) 164/84   Pulse (!) 57   Ht 5\' 2"  (1.575 m)   Wt 156 lb (70.8 kg)   BMI 28.53 kg/m   Constitutional:   Pleasant Elderly Caucasian female appears to be in NAD, Well developed, Well nourished, alert and cooperative Respiratory: Respirations even and unlabored. Lungs clear to auscultation bilaterally.   No wheezes, crackles, or rhonchi.  Cardiovascular: Normal S1, S2. No MRG. Regular rate and rhythm. No peripheral edema, cyanosis or pallor.  Gastrointestinal:  Soft, nondistended, nontender. No rebound or guarding. Normal bowel sounds. No appreciable masses or hepatomegaly. Rectal:  Not performed.  Psychiatric: Demonstrates good judgement and reason without abnormal affect or behaviors.  RELEVANT LABS AND IMAGING: CBC    Component Value Date/Time   WBC 5.9 07/05/2020 1233   RBC 4.01 07/05/2020 1233   HGB 12.2 07/05/2020 1233   HCT 36.7 07/05/2020 1233   PLT 269.0  07/05/2020 1233   MCV 91.5 07/05/2020 1233   MCH 30.9 02/02/2020 1000   MCHC 33.2 07/05/2020 1233   RDW 14.2 07/05/2020 1233   LYMPHSABS 1.1 07/05/2020 1233   MONOABS 0.4 07/05/2020 1233   EOSABS 0.1 07/05/2020 1233   BASOSABS 0.0 07/05/2020 1233    CMP     Component Value Date/Time   NA 140 07/05/2020 1233   NA 144 03/16/2020 1152   K 4.3 07/05/2020 1233   CL 103 07/05/2020 1233   CO2 30 07/05/2020 1233   GLUCOSE 84 07/05/2020 1233   BUN 14 07/05/2020 1233   BUN 14 03/16/2020 1152   CREATININE 0.80 07/05/2020 1233   CREATININE 0.91 (H) 02/02/2020 1000   CALCIUM 10.8 (H) 07/05/2020 1233   PROT 6.4 07/05/2020 1233   ALBUMIN 4.2 07/05/2020 1233   AST 17 07/05/2020 1233   ALT 11 07/05/2020 1233   ALKPHOS 75 07/05/2020 1233   BILITOT 0.7 07/05/2020 1233   GFRNONAA 64 03/16/2020 1152   GFRAA 74 03/16/2020 1152    Assessment: 1.  Globus sensation: Recent swallow study with a Schatzki's ring which is likely the cause 2.  Epigastric discomfort: With above 3.  Constipation: Under control on current regimen  Plan: 1.  Scheduled patient for an EGD with dilation in the [place] with Dr. Fuller Plan.  Patient was provided with a detailed list of risks for the procedure and she agrees to proceed.  She has had her COVID vaccines. 2.  Continue Nexium 40 mg twice daily for now.  Pending EGD as above Dr. Fuller Plan can decrease this as needed. 3.  Patient to follow in clinic per recommendations from Dr. Fuller Plan after time of procedure   Ellouise Newer, PA-C [facility] 07/09/2020, 10:27 AM  Cc: [name], MD

## 2020-07-12 NOTE — Progress Notes (Signed)
Reviewed and agree with management plan.  Malcolm T. Stark, MD FACG [phone]  

## 2020-07-16 ENCOUNTER — Ambulatory Visit (AMBULATORY_SURGERY_CENTER): Payer: PPO | Admitting: Gastroenterology

## 2020-07-16 ENCOUNTER — Encounter: Payer: Self-pay | Admitting: Gastroenterology

## 2020-07-16 ENCOUNTER — Other Ambulatory Visit: Payer: Self-pay

## 2020-07-16 VITALS — BP 159/60 | HR 59 | Temp 97.3°F | Resp 12 | Ht 62.0 in | Wt 156.0 lb

## 2020-07-16 DIAGNOSIS — I10 Essential (primary) hypertension: Secondary | ICD-10-CM | POA: Diagnosis not present

## 2020-07-16 DIAGNOSIS — R933 Abnormal findings on diagnostic imaging of other parts of digestive tract: Secondary | ICD-10-CM

## 2020-07-16 DIAGNOSIS — K317 Polyp of stomach and duodenum: Secondary | ICD-10-CM | POA: Diagnosis not present

## 2020-07-16 DIAGNOSIS — K449 Diaphragmatic hernia without obstruction or gangrene: Secondary | ICD-10-CM | POA: Diagnosis not present

## 2020-07-16 DIAGNOSIS — K219 Gastro-esophageal reflux disease without esophagitis: Secondary | ICD-10-CM | POA: Diagnosis not present

## 2020-07-16 DIAGNOSIS — R1310 Dysphagia, unspecified: Secondary | ICD-10-CM

## 2020-07-16 DIAGNOSIS — R198 Other specified symptoms and signs involving the digestive system and abdomen: Secondary | ICD-10-CM

## 2020-07-16 DIAGNOSIS — K222 Esophageal obstruction: Secondary | ICD-10-CM | POA: Diagnosis not present

## 2020-07-16 DIAGNOSIS — R0989 Other specified symptoms and signs involving the circulatory and respiratory systems: Secondary | ICD-10-CM

## 2020-07-16 DIAGNOSIS — R09A2 Foreign body sensation, throat: Secondary | ICD-10-CM

## 2020-07-16 MED ORDER — SODIUM CHLORIDE 0.9 % IV SOLN: 500.0000 mL | Freq: Once | INTRAVENOUS | Status: DC

## 2020-07-16 NOTE — Op Note (Signed)
[facility] Patient Name: Robin [name] Procedure Date: 07/16/2020 8:02 AM MRN: [id number] Endoscopist: [name] , MD Age: 87 Referring MD:  Date of Birth: February 10, 1934 Gender: Female Account #: [id number] Procedure:                Upper GI endoscopy Indications:              Dysphagia, Abnormal esophagram, Globus sensation Medicines:                Monitored Anesthesia Care Procedure:                Pre-Anesthesia Assessment:                           - Prior to the procedure, a History and Physical                            was performed, and patient medications and                            allergies were reviewed. The patient's tolerance of                            previous anesthesia was also reviewed. The risks                            and benefits of the procedure and the sedation                            options and risks were discussed with the patient.                            All questions were answered, and informed consent                            was obtained. Prior Anticoagulants: The patient has                            taken no previous anticoagulant or antiplatelet                            agents. ASA Grade Assessment: III - A patient with                            severe systemic disease. After reviewing the risks                            and benefits, the patient was deemed in                            satisfactory condition to undergo the procedure.                           After obtaining informed consent, the endoscope was  passed under direct vision. Throughout the                            procedure, the patient's blood pressure, pulse, and                            oxygen saturations were monitored continuously. The                            Endoscope was introduced through the mouth, and                            advanced to the second part of duodenum. The upper                            GI  endoscopy was accomplished without difficulty.                            The patient tolerated the procedure well. Scope In: Scope Out: Findings:                 One benign-appearing, intrinsic mild stenosis was                            found at the gastroesophageal junction. This                            stenosis measured 1.5 cm (inner diameter) x less                            than one cm (in length). The stenosis was                            traversed. A guidewire was placed and the scope was                            withdrawn. Dilations were performed with Savary                            dilators with mild resistance at 16 mm, 17 mm.                           The exam of the esophagus was otherwise normal.                           Three 3 to 5 mm sessile polyps with no bleeding and                            no stigmata of recent bleeding were found in the                            gastric body. Biopsies were taken with a cold  forceps for histology.                           A small hiatal hernia was present.                           The exam of the stomach was otherwise normal.                           The duodenal bulb and second portion of the                            duodenum were normal. Complications:            No immediate complications. Estimated Blood Loss:     Estimated blood loss was minimal. Impression:               - Benign-appearing esophageal stenosis. Dilated.                           - Three gastric polyps. Biopsied.                           - Small hiatal hernia.                           - Normal duodenal bulb and second portion of the                            duodenum. Recommendation:           - Patient has a contact number available for                            emergencies. The signs and symptoms of potential                            delayed complications were discussed with the                             patient. Return to normal activities tomorrow.                            Written discharge instructions were provided to the                            patient.                           - Clear liquid diet for 2 hours, then advance as                            tolerated to soft diet today.                           - Resume prior diet tomorrow.                           -  Follow antireflux measures long term.                           - Continue present medications.                           - Await pathology results. [name], MD 07/16/2020 8:21:07 AM This report has been signed electronically.

## 2020-07-16 NOTE — Progress Notes (Signed)
VS taken by C.W. 

## 2020-07-16 NOTE — Progress Notes (Signed)
Report to PACU, RN, vss, BBS= Clear.  

## 2020-07-16 NOTE — Patient Instructions (Signed)
Clear liquids until 10:30, soft foods for the rest of today. Resume normal diet tomorrow.      YOU HAD AN ENDOSCOPIC PROCEDURE TODAY AT [facility]:   Refer to the procedure report that was given to you for any specific questions about what was found during the examination.  If the procedure report does not answer your questions, please call your gastroenterologist to clarify.  If you requested that your care partner not be given the details of your procedure findings, then the procedure report has been included in a sealed envelope for you to review at your convenience later.  YOU SHOULD EXPECT: Some feelings of bloating in the abdomen. Passage of more gas than usual.  Walking can help get rid of the air that was put into your GI tract during the procedure and reduce the bloating. If you had a lower endoscopy (such as a colonoscopy or flexible sigmoidoscopy) you may notice spotting of blood in your stool or on the toilet paper. If you underwent a bowel prep for your procedure, you may not have a normal bowel movement for a few days.  Please Note:  You might notice some irritation and congestion in your nose or some drainage.  This is from the oxygen used during your procedure.  There is no need for concern and it should clear up in a day or so.  SYMPTOMS TO REPORT IMMEDIATELY:   Following upper endoscopy (EGD)  Vomiting of blood or coffee ground material  New chest pain or pain under the shoulder blades  Painful or persistently difficult swallowing  New shortness of breath  Fever of 100F or higher  Black, tarry-looking stools  For urgent or emergent issues, a gastroenterologist can be reached at any hour by calling [phone]. Do not use MyChart messaging for urgent concerns.    DIET:  Clear liquids until 10:30, soft foods for the rest of today.  Tommorrow you may proceed to your regular diet.  Drink plenty of fluids but you should avoid alcoholic beverages for 24  hours.  ACTIVITY:  You should plan to take it easy for the rest of today and you should NOT DRIVE or use heavy machinery until tomorrow (because of the sedation medicines used during the test).    FOLLOW UP: Our staff will call the number listed on your records 48-72 hours following your procedure to check on you and address any questions or concerns that you may have regarding the information given to you following your procedure. If we do not reach you, we will leave a message.  We will attempt to reach you two times.  During this call, we will ask if you have developed any symptoms of COVID 19. If you develop any symptoms (ie: fever, flu-like symptoms, shortness of breath, cough etc.) before then, please call [phone].  If you test positive for Covid 19 in the 2 weeks post procedure, please call and report this information to [place].    If any biopsies were taken you will be contacted by phone or by letter within the next 1-3 weeks.  Please call us at [phone] if you have not heard about the biopsies in 3 weeks.    SIGNATURES/CONFIDENTIALITY: You and/or your care partner have signed paperwork which will be entered into your electronic medical record.  These signatures attest to the fact that that the information above on your After Visit Summary has been reviewed and is understood.  Full responsibility of the confidentiality of  this discharge information lies with you and/or your care-partner.

## 2020-07-16 NOTE — Progress Notes (Signed)
Called to room to assist during endoscopic procedure.  Patient ID and intended procedure confirmed with present staff. Received instructions for my participation in the procedure from the performing physician.  

## 2020-07-19 ENCOUNTER — Ambulatory Visit: Payer: PPO | Admitting: Cardiology

## 2020-07-19 ENCOUNTER — Other Ambulatory Visit: Payer: Self-pay

## 2020-07-19 ENCOUNTER — Ambulatory Visit: Payer: PPO | Admitting: Nurse Practitioner

## 2020-07-19 ENCOUNTER — Encounter: Payer: Self-pay | Admitting: Cardiology

## 2020-07-19 VITALS — BP 128/58 | HR 64 | Ht 62.0 in | Wt 155.4 lb

## 2020-07-19 DIAGNOSIS — R609 Edema, unspecified: Secondary | ICD-10-CM

## 2020-07-19 DIAGNOSIS — R55 Syncope and collapse: Secondary | ICD-10-CM | POA: Diagnosis not present

## 2020-07-19 DIAGNOSIS — I350 Nonrheumatic aortic (valve) stenosis: Secondary | ICD-10-CM

## 2020-07-19 DIAGNOSIS — I10 Essential (primary) hypertension: Secondary | ICD-10-CM

## 2020-07-19 MED ORDER — SPIRONOLACTONE 25 MG PO TABS: 25.0000 mg | ORAL_TABLET | Freq: Two times a day (BID) | ORAL | Status: DC

## 2020-07-19 MED ORDER — FUROSEMIDE 20 MG PO TABS: ORAL_TABLET | ORAL | 3 refills | Status: AC

## 2020-07-19 NOTE — Patient Instructions (Signed)
Medication Instructions:   DECREASE SPIRONOLACTONE TO 25 MG TWICE DAILY  TAKE FUROSEMIDE 20 MG ONCE EVERY OTHER DAY AS NEEDED FOR SWELLING  *If you need a refill on your cardiac medications before your next appointment, please call your pharmacy*  Your physician has requested that you have an echocardiogram. Echocardiography is a painless test that uses sound waves to create images of your heart. It provides your doctor with information about the size and shape of your heart and how well your heart's chambers and valves are working. This procedure takes approximately one hour. There are no restrictions for this procedure.[place]    Follow-Up: At [facility], you and your health needs are our priority.  As part of our continuing mission to provide you with exceptional heart care, we have created designated Provider Care Teams.  These Care Teams include your primary Cardiologist (physician) and Advanced Practice Providers (APPs -  Physician Assistants and Nurse Practitioners) who all work together to provide you with the care you need, when you need it.  We recommend signing up for the patient portal called "MyChart".  Sign up information is provided on this After Visit Summary.  MyChart is used to connect with patients for Virtual Visits (Telemedicine).  Patients are able to view lab/test results, encounter notes, upcoming appointments, etc.  Non-urgent messages can be sent to your provider as well.   To learn more about what you can do with MyChart, go to [email or web address].    Your next appointment:   3 month(s)  The format for your next appointment:   In Person  Provider:   Kirk Ruths, MD

## 2020-07-20 ENCOUNTER — Telehealth: Payer: Self-pay

## 2020-07-20 NOTE — Telephone Encounter (Signed)
  Follow up Call-  Call back number 07/16/2020 03/18/2018  Post procedure Call Back phone  # [phone]  Permission to leave phone message Yes Yes  Some recent data might be hidden     Patient questions:  Do you have a fever, pain , or abdominal swelling? No. Pain Score  0 *  Have you tolerated food without any problems? Yes.    Have you been able to return to your normal activities? Yes.    Do you have any questions about your discharge instructions: Diet   No. Medications  No. Follow up visit  No.  Do you have questions or concerns about your Care? No.  Actions: * If pain score is 4 or above: No action needed, pain <4. 1. Have you developed a fever since your procedure? no  2.   Have you had an respiratory symptoms (SOB or cough) since your procedure? no  3.   Have you tested positive for COVID 19 since your procedure no  4.   Have you had any family members/close contacts diagnosed with the COVID 19 since your procedure?  no   If yes to any of these questions please route to Joylene John, RN and Joella Prince, RN

## 2020-07-22 NOTE — Progress Notes (Deleted)
GYNECOLOGY  VISIT  CC:   ***  HPI: 87 y.o. G73P2002 Widowed White or Caucasian female here for follow up of lichen sclerosus.     GYNECOLOGIC HISTORY: No LMP recorded. Patient is postmenopausal. Contraception: *** Menopausal hormone therapy: ***  Patient Active Problem List   Diagnosis Date Noted  . Osteoporosis of femur without pathological fracture 06/24/2019  . Dyspnea 02/03/2019  . Asthma 02/03/2019  . Vertigo 06/26/2017  . Hyperparathyroidism ([place]) 08/24/2014  . Slow transit constipation 01/13/2014  . IBS (irritable bowel syndrome), GERD, hx colon polyps, chronic abd pain - followed by Dr. Sharlett Iles in GI 06/11/2012  . Osteopenia 08/29/2011  . Vitamin D deficiency 08/29/2011  . GERD (gastroesophageal reflux disease) 11/23/2010  . Dyslipidemia 09/09/2007  . Hypertension, essential, benign 09/09/2007    Past Medical History:  Diagnosis Date  . Abnormal Pap smear of cervix   . Allergy   . Arthritis   . Asthma   . Cataract 2018   bilateral eyes  . Colon polyps    tubulovillous adenoma, 2010  . External hemorrhoids without mention of complication   . GERD (gastroesophageal reflux disease)   . Headache    eval with neuro in 2011  . Hyperlipemia   . Hyperparathyroidism [facility])    seeing Dr. Dwyane Dee in endocrinology  . Hypertension   . IBS (irritable bowel syndrome), GERD, hx colon polyps, chronic abd pain - followed by Dr. Sharlett Iles in GI 06/11/2012  . Osteoporosis 08/29/2011  . Vertigo    eval with neuro in 2011, brief recurrence 2016    Past Surgical History:  Procedure Laterality Date  . BACK SURGERY  1974  . CARPAL TUNNEL RELEASE Left 06/2008  . COLONOSCOPY    . EYE SURGERY  07/2017   bilateral cataract extraction  . POLYPECTOMY    . TONSILLECTOMY    . UPPER GASTROINTESTINAL ENDOSCOPY      MEDS:   Current Outpatient Medications on File Prior to Visit  Medication Sig Dispense Refill  . acetaminophen (TYLENOL) 500 MG tablet Take 500-1,000 mg by mouth  every 6 (six) hours as needed for mild pain or headache.     . cetirizine (ZYRTEC) 10 MG tablet Take 10 mg by mouth daily.    . Cholecalciferol 25 MCG (1000 UT) TBDP Take 1,000 Units by mouth daily. 90 tablet 3  . esomeprazole (NEXIUM) 40 MG capsule Take 1 capsule (40 mg total) by mouth 2 (two) times daily before a meal. 180 capsule 3  . ezetimibe (ZETIA) 10 MG tablet TAKE 1 BY MOUTH DAILY 90 tablet 3  . furosemide (LASIX) 20 MG tablet 1 tablet by mouth every other day as needed for swelling 45 tablet 3  . labetalol (NORMODYNE) 100 MG tablet Take 1 tablet (100 mg total) by mouth 2 (two) times daily. 180 tablet 3  . losartan (COZAAR) 100 MG tablet Take 1 tablet (100 mg total) by mouth at bedtime. 90 tablet 3  . rosuvastatin (CRESTOR) 5 MG tablet Take 1/2 tablet by mouth daily. 45 tablet 3  . spironolactone (ALDACTONE) 25 MG tablet Take 1 tablet (25 mg total) by mouth 2 (two) times daily.     No current facility-administered medications on file prior to visit.    ALLERGIES: Prilosec [omeprazole], Amlodipine, Protonix [pantoprazole sodium], Amoxicillin, and Metronidazole  Family History  Problem Relation Age of Onset  . Diabetes Brother   . Hypertension Brother   . Stroke Brother 35  . Hypertension Father   . Stroke Father   .  Diabetes Sister   . Hypertension Sister   . Heart disease Sister        heart failure  . CAD Mother        Died of MI at age 110  . Hypertension Mother   . CAD Brother        Died of MI at age 28  . Hypertension Brother   . Stroke Sister 53  . Hypertension Sister   . Stroke Brother 43  . Hypertension Brother   . Colon cancer Neg Hx   . Colon polyps Neg Hx   . Esophageal cancer Neg Hx   . Pancreatic cancer Neg Hx   . Stomach cancer Neg Hx   . Rectal cancer Neg Hx     SH:  ***  Review of Systems  PHYSICAL EXAMINATION:    There were no vitals taken for this visit.    General appearance: alert, cooperative, no acute distress  CV:  {Exam; heart  brief:31539} Lungs:  {pe lungs ob:314451::"clear to auscultation, no wheezes, rales or rhonchi, symmetric air entry"} Breasts: {Exam; breast:13139::"normal appearance, no masses or tenderness"} Abdomen: soft, non-tender; bowel sounds normal; no masses,  no organomegaly Lymph:  no inguinal LAD noted  Pelvic: External genitalia:  no lesions              Urethra:  normal appearing urethra with no masses, tenderness or lesions              Bartholins and Skenes: normal                 Vagina: normal appearing vagina               Cervix: {CHL AMB PHY EX CERVIX NORM DEFAULT:[phone]::"no lesions"}              Bimanual Exam:  Uterus:  {CHL AMB PHY EX UTERUS NORM DEFAULT:[phone]::"normal size, contour, position, consistency, mobility, non-tender"}              Adnexa: {CHL AMB PHY EX ADNEXA NO MASS DEFAULT:[phone]::"no mass, fullness, tenderness"}               Chaperone, ***, CMA, was present for exam.  Assessment: ***  Plan: ***   {NUMBERS; -10-45 JOINT ROM:10287} minutes of total time was spent for this patient encounter, including preparation, face-to-face counseling with the patient and coordination of care, and documentation of the encounter.

## 2020-07-23 ENCOUNTER — Encounter: Payer: Self-pay | Admitting: Gastroenterology

## 2020-07-26 ENCOUNTER — Ambulatory Visit: Payer: PPO | Admitting: Nurse Practitioner

## 2020-07-26 NOTE — Progress Notes (Unsigned)
GYNECOLOGY  VISIT  CC:   Lichen sclerosis  HPI: 87 y.o. G33P2002 Widowed White or Caucasian female here for follow up of lichen sclerosus.   Has been using clobetasol, using twice per week and is helpful  GYNECOLOGIC HISTORY: No LMP recorded. Patient is postmenopausal. Contraception: post menopausal Menopausal hormone therapy: none  Patient Active Problem List   Diagnosis Date Noted  . Osteoporosis of femur without pathological fracture 06/24/2019  . Dyspnea 02/03/2019  . Asthma 02/03/2019  . Vertigo 06/26/2017  . Hyperparathyroidism ([place]) 08/24/2014  . Slow transit constipation 01/13/2014  . IBS (irritable bowel syndrome), GERD, hx colon polyps, chronic abd pain - followed by Dr. Sharlett Iles in GI 06/11/2012  . Osteopenia 08/29/2011  . Vitamin D deficiency 08/29/2011  . GERD (gastroesophageal reflux disease) 11/23/2010  . Dyslipidemia 09/09/2007  . Hypertension, essential, benign 09/09/2007    Past Medical History:  Diagnosis Date  . Abnormal Pap smear of cervix   . Allergy   . Arthritis   . Asthma   . Cataract 2018   bilateral eyes  . Colon polyps    tubulovillous adenoma, 2010  . External hemorrhoids without mention of complication   . GERD (gastroesophageal reflux disease)   . Headache    eval with neuro in 2011  . Hyperlipemia   . Hyperparathyroidism [facility])    seeing Dr. Dwyane Dee in endocrinology  . Hypertension   . IBS (irritable bowel syndrome), GERD, hx colon polyps, chronic abd pain - followed by Dr. Sharlett Iles in GI 06/11/2012  . Osteoporosis 08/29/2011  . Vertigo    eval with neuro in 2011, brief recurrence 2016    Past Surgical History:  Procedure Laterality Date  . BACK SURGERY  1974  . CARPAL TUNNEL RELEASE Left 06/2008  . COLONOSCOPY    . EYE SURGERY  07/2017   bilateral cataract extraction  . POLYPECTOMY    . TONSILLECTOMY    . UPPER GASTROINTESTINAL ENDOSCOPY      MEDS:   Current Outpatient Medications on File Prior to Visit  Medication Sig  Dispense Refill  . acetaminophen (TYLENOL) 500 MG tablet Take 500-1,000 mg by mouth every 6 (six) hours as needed for mild pain or headache.     . cetirizine (ZYRTEC) 10 MG tablet Take 10 mg by mouth daily.    . Cholecalciferol 25 MCG (1000 UT) TBDP Take 1,000 Units by mouth daily. 90 tablet 3  . esomeprazole (NEXIUM) 40 MG capsule Take 1 capsule (40 mg total) by mouth 2 (two) times daily before a meal. 180 capsule 3  . ezetimibe (ZETIA) 10 MG tablet TAKE 1 BY MOUTH DAILY 90 tablet 3  . furosemide (LASIX) 20 MG tablet 1 tablet by mouth every other day as needed for swelling 45 tablet 3  . labetalol (NORMODYNE) 100 MG tablet Take 1 tablet (100 mg total) by mouth 2 (two) times daily. 180 tablet 3  . losartan (COZAAR) 100 MG tablet Take 1 tablet (100 mg total) by mouth at bedtime. 90 tablet 3  . rosuvastatin (CRESTOR) 5 MG tablet Take 1/2 tablet by mouth daily. 45 tablet 3  . spironolactone (ALDACTONE) 25 MG tablet Take 1 tablet (25 mg total) by mouth 2 (two) times daily.     No current facility-administered medications on file prior to visit.    ALLERGIES: Prilosec [omeprazole], Amlodipine, Protonix [pantoprazole sodium], Amoxicillin, and Metronidazole  Family History  Problem Relation Age of Onset  . Diabetes Brother   . Hypertension Brother   . Stroke Brother 32  .  Hypertension Father   . Stroke Father   . Diabetes Sister   . Hypertension Sister   . Heart disease Sister        heart failure  . CAD Mother        Died of MI at age 16  . Hypertension Mother   . CAD Brother        Died of MI at age 69  . Hypertension Brother   . Stroke Sister 65  . Hypertension Sister   . Stroke Brother 51  . Hypertension Brother   . Colon cancer Neg Hx   . Colon polyps Neg Hx   . Esophageal cancer Neg Hx   . Pancreatic cancer Neg Hx   . Stomach cancer Neg Hx   . Rectal cancer Neg Hx    Review of Systems  PHYSICAL EXAMINATION:    There were no vitals taken for this visit.    General  appearance: alert, cooperative, no acute distress  Pelvic: External genitalia:  Mild-moderate redness, no skin breakdown              Urethra:  normal appearing urethra with no masses, tenderness or lesions              Bartholins and Skenes: normal      Assessment: Lichen sclerosis  Plan: Continue to use clobetasol twice weekly. Pt states she still has enough of the Rx written by Dr. Sabra Heck and will call when refill is needed  May use vaseline on days that she does not use clobetasol  Advised to call if she has a flare that is not relieved by clobetasol  F/u annually for evaluation of lichen sclerosis

## 2020-07-29 ENCOUNTER — Encounter: Payer: Self-pay | Admitting: Nurse Practitioner

## 2020-07-29 ENCOUNTER — Other Ambulatory Visit: Payer: Self-pay

## 2020-07-29 ENCOUNTER — Ambulatory Visit: Payer: PPO | Admitting: Nurse Practitioner

## 2020-07-29 VITALS — BP 124/80 | HR 74 | Resp 14 | Wt 156.0 lb

## 2020-07-29 DIAGNOSIS — L900 Lichen sclerosus et atrophicus: Secondary | ICD-10-CM

## 2020-07-29 NOTE — Patient Instructions (Signed)
Continue to use clobetasol twice weekly  Call when you need refills

## 2020-08-06 ENCOUNTER — Other Ambulatory Visit: Payer: Self-pay | Admitting: *Deleted

## 2020-08-06 DIAGNOSIS — E7800 Pure hypercholesterolemia, unspecified: Secondary | ICD-10-CM

## 2020-08-06 MED ORDER — ROSUVASTATIN CALCIUM 5 MG PO TABS: ORAL_TABLET | ORAL | 1 refills | Status: DC

## 2020-08-06 NOTE — Telephone Encounter (Signed)
Rx done. 

## 2020-08-16 ENCOUNTER — Other Ambulatory Visit: Payer: Self-pay

## 2020-08-16 ENCOUNTER — Ambulatory Visit (HOSPITAL_COMMUNITY): Payer: PPO | Attending: Cardiology

## 2020-08-16 DIAGNOSIS — R55 Syncope and collapse: Secondary | ICD-10-CM | POA: Insufficient documentation

## 2020-08-16 LAB — ECHOCARDIOGRAM COMPLETE
AR max vel: 1.38 cm2
AV Area VTI: 1.39 cm2
AV Area mean vel: 1.41 cm2
AV Mean grad: 10 mmHg
AV Peak grad: 16.8 mmHg
Ao pk vel: 2.05 m/s
Area-P 1/2: 2.54 cm2
S' Lateral: 2.7 cm

## 2020-10-01 ENCOUNTER — Other Ambulatory Visit: Payer: Self-pay

## 2020-10-04 ENCOUNTER — Encounter: Payer: Self-pay | Admitting: Family Medicine

## 2020-10-04 ENCOUNTER — Other Ambulatory Visit: Payer: Self-pay

## 2020-10-04 ENCOUNTER — Ambulatory Visit (INDEPENDENT_AMBULATORY_CARE_PROVIDER_SITE_OTHER): Payer: PPO | Admitting: Family Medicine

## 2020-10-04 VITALS — BP 152/70 | HR 60 | Temp 98.0°F | Ht 62.0 in | Wt 159.6 lb

## 2020-10-04 DIAGNOSIS — I10 Essential (primary) hypertension: Secondary | ICD-10-CM | POA: Diagnosis not present

## 2020-10-04 DIAGNOSIS — E785 Hyperlipidemia, unspecified: Secondary | ICD-10-CM

## 2020-10-04 DIAGNOSIS — E538 Deficiency of other specified B group vitamins: Secondary | ICD-10-CM

## 2020-10-04 DIAGNOSIS — R350 Frequency of micturition: Secondary | ICD-10-CM | POA: Diagnosis not present

## 2020-10-04 DIAGNOSIS — M545 Low back pain, unspecified: Secondary | ICD-10-CM

## 2020-10-04 DIAGNOSIS — E213 Hyperparathyroidism, unspecified: Secondary | ICD-10-CM | POA: Diagnosis not present

## 2020-10-04 LAB — COMPREHENSIVE METABOLIC PANEL
ALT: 12 U/L (ref 0–35)
AST: 16 U/L (ref 0–37)
Albumin: 4.2 g/dL (ref 3.5–5.2)
Alkaline Phosphatase: 73 U/L (ref 39–117)
BUN: 16 mg/dL (ref 6–23)
CO2: 30 mEq/L (ref 19–32)
Calcium: 10.8 mg/dL — ABNORMAL HIGH (ref 8.4–10.5)
Chloride: 105 mEq/L (ref 96–112)
Creatinine, Ser: 0.83 mg/dL (ref 0.40–1.20)
GFR: 64.58 mL/min (ref >60.00)
Glucose, Bld: 83 mg/dL (ref 70–99)
Potassium: 4.5 mEq/L (ref 3.5–5.1)
Sodium: 141 mEq/L (ref 135–145)
Total Bilirubin: 0.6 mg/dL (ref 0.2–1.2)
Total Protein: 6.4 g/dL (ref 6.0–8.3)

## 2020-10-04 LAB — CBC WITH DIFFERENTIAL/PLATELET
Basophils Absolute: 0 10*3/uL (ref 0.0–0.1)
Basophils Relative: 0.7 % (ref 0.0–3.0)
Eosinophils Absolute: 0.2 10*3/uL (ref 0.0–0.7)
Eosinophils Relative: 3.9 % (ref 0.0–5.0)
HCT: 35.1 % — ABNORMAL LOW (ref 36.0–46.0)
Hemoglobin: 11.9 g/dL — ABNORMAL LOW (ref 12.0–15.0)
Lymphocytes Relative: 19.2 % (ref 12.0–46.0)
Lymphs Abs: 1.2 10*3/uL (ref 0.7–4.0)
MCHC: 34 g/dL (ref 30.0–36.0)
MCV: 93 fl (ref 78.0–100.0)
Monocytes Absolute: 0.5 10*3/uL (ref 0.1–1.0)
Monocytes Relative: 7.8 % (ref 3.0–12.0)
Neutro Abs: 4.1 10*3/uL (ref 1.4–7.7)
Neutrophils Relative %: 68.4 % (ref 43.0–77.0)
Platelets: 259 10*3/uL (ref 150.0–400.0)
RBC: 3.77 Mil/uL — ABNORMAL LOW (ref 3.87–5.11)
RDW: 12.8 % (ref 11.5–15.5)
WBC: 6 10*3/uL (ref 4.0–10.5)

## 2020-10-04 LAB — VITAMIN B12: Vitamin B-12: 1319 pg/mL — ABNORMAL HIGH (ref 211–911)

## 2020-10-04 LAB — LIPID PANEL
Cholesterol: 154 mg/dL (ref 0–200)
HDL: 60.4 mg/dL (ref >39.00)
LDL Cholesterol: 78 mg/dL (ref 0–99)
NonHDL: 93.22
Total CHOL/HDL Ratio: 3
Triglycerides: 75 mg/dL (ref 0.0–149.0)
VLDL: 15 mg/dL (ref 0.0–40.0)

## 2020-10-04 NOTE — Progress Notes (Signed)
HPI: FUdizzinessand hypertension. ABIs August 2010 normal. Stress echo February 2014 normal. MRI July 2018 showed chronic small vessel ischemic changes in the cerebral white matter and pons. MRA without significant vertebrobasilar disease. CTA showed normal circle of Willis. EEG also normal.Patient has chronic mild dizziness.CTA September 2020 showed no pulmonary embolus. There was note of coronary artery disease/aortic atherosclerosis. Monitor October 2021 showed sinus rhythm with PACs, PVCs and brief PAT. There was also 6 beats of nonsustained ventricular tachycardia.  Echocardiogram April 2022 showed normal LV function, mild mitral regurgitation; mild aortic stenosis with mean gradient 10 mmHg.  Patient had syncopal episodes at time of last office visit.  Since last seen,she denies dyspnea, chest pain or palpitations.  She has had 2 separate dizzy spells but no syncope.  They were sudden in onset with no preceding chest pain, nausea, palpitations or dyspnea.  She does feel "hot" prior to the episodes.  They last 2 to 3 minutes.  Her blood pressure is low during these episodes.  He also notes leg cramps.  Current Outpatient Medications  Medication Sig Dispense Refill  . acetaminophen (TYLENOL) 500 MG tablet Take 500-1,000 mg by mouth every 6 (six) hours as needed for mild pain or headache.     . cetirizine (ZYRTEC) 10 MG tablet Take 10 mg by mouth daily.    . Cholecalciferol 25 MCG (1000 UT) TBDP Take 1,000 Units by mouth daily. 90 tablet 3  . esomeprazole (NEXIUM) 40 MG capsule Take 1 capsule (40 mg total) by mouth 2 (two) times daily before a meal. 180 capsule 3  . ezetimibe (ZETIA) 10 MG tablet TAKE 1 BY MOUTH DAILY 90 tablet 3  . furosemide (LASIX) 20 MG tablet 1 tablet by mouth every other day as needed for swelling 45 tablet 3  . labetalol (NORMODYNE) 100 MG tablet Take 1 tablet (100 mg total) by mouth 2 (two) times daily. 180 tablet 3  . losartan (COZAAR) 100 MG tablet Take 1  tablet (100 mg total) by mouth at bedtime. 90 tablet 3  . rosuvastatin (CRESTOR) 5 MG tablet Take 1/2 tablet by mouth daily. 45 tablet 1  . spironolactone (ALDACTONE) 25 MG tablet Take 1 tablet (25 mg total) by mouth 2 (two) times daily.     No current facility-administered medications for this visit.     Past Medical History:  Diagnosis Date  . Abnormal Pap smear of cervix   . Allergy   . Arthritis   . Asthma   . Cataract 2018   bilateral eyes  . Colon polyps    tubulovillous adenoma, 2010  . External hemorrhoids without mention of complication   . GERD (gastroesophageal reflux disease)   . Headache    eval with neuro in 2011  . Hyperlipemia   . Hyperparathyroidism [facility])    seeing Dr. Dwyane Dee in endocrinology  . Hypertension   . IBS (irritable bowel syndrome), GERD, hx colon polyps, chronic abd pain - followed by Dr. Sharlett Iles in GI 06/11/2012  . Osteoporosis 08/29/2011  . Vertigo    eval with neuro in 2011, brief recurrence 2016    Past Surgical History:  Procedure Laterality Date  . BACK SURGERY  1974  . CARPAL TUNNEL RELEASE Left 06/2008  . COLONOSCOPY    . EYE SURGERY  07/2017   bilateral cataract extraction  . POLYPECTOMY    . TONSILLECTOMY    . UPPER GASTROINTESTINAL ENDOSCOPY      Social History   Socioeconomic History  .  Marital status: Widowed    Spouse name: Not on file  . Number of children: 2  . Years of education: Not on file  . Highest education level: Not on file  Occupational History  . 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 Smoking status: Never Smoker  . Smokeless tobacco: Never Used  Vaping Use  . Vaping Use: Never used  Substance and Sexual Activity  . Alcohol use: No    Alcohol/week: 0.0 standard drinks  . Drug use: No  . Sexual activity: Not Currently    Birth control/protection: Post-menopausal  Other Topics Concern  . Not on file  Social History Narrative   Lives alone in a one  story home.  Has 2 children, one son and one daughter with 2 grandkids and 1 great-grandchild.    Retired from working for a bank.     Education: high school.    Social Determinants of Health   Financial Resource Strain: Low Risk   . Difficulty of Paying Living Expenses: Not hard at all  Food Insecurity: No Food Insecurity  . Worried About Charity fundraiser in the Last Year: Never true  . Ran Out of Food in the Last Year: Never true  Transportation Needs: No Transportation Needs  . Lack of Transportation (Medical): No  . Lack of Transportation (Non-Medical): No  Physical Activity: Inactive  . Days of Exercise per Week: 0 days  . Minutes of Exercise per Session: 0 min  Stress: No Stress Concern Present  . Feeling of Stress : Not at all  Social Connections: Socially Isolated  . Frequency of Communication with Friends and Family: More than three times a week  . Frequency of Social Gatherings with Friends and Family: Once a week  . Attends Religious Services: Never  . Active Member of Clubs or Organizations: No  . Attends Archivist Meetings: Never  . Marital Status: Widowed  Intimate Partner Violence: Not At Risk  . Fear of Current or Ex-Partner: No  . Emotionally Abused: No  . Physically Abused: No  . Sexually Abused: No    Family History  Problem Relation Age of Onset  . Diabetes Brother   . Hypertension Brother   . Stroke Brother 34  . Hypertension Father   . Stroke Father   . Diabetes Sister   . Hypertension Sister   . Heart disease Sister        heart failure  . CAD Mother        Died of MI at age 17  . Hypertension Mother   . CAD Brother        Died of MI at age 40  . Hypertension Brother   . Stroke Sister 75  . Hypertension Sister   . Stroke Brother 65  . Hypertension Brother   . Colon cancer Neg Hx   . Colon polyps Neg Hx   . Esophageal cancer Neg Hx   . Pancreatic cancer Neg Hx   . Stomach cancer Neg Hx   . Rectal cancer Neg Hx     ROS:  Leg cramps but no fevers or chills, productive cough, hemoptysis, dysphasia, odynophagia, melena, hematochezia, dysuria, hematuria, rash, seizure activity, orthopnea, PND, pedal edema, claudication. Remaining systems are negative.  Physical Exam: Well-developed well-nourished in no acute distress.  Skin is warm and dry.  HEENT is normal.  Neck is supple.  Chest is clear to auscultation with normal expansion.  Cardiovascular exam is regular rate and rhythm.  Abdominal exam nontender or distended. No masses palpated. Extremities show no edema. neuro grossly intact  A/P  1 syncope-previous episode felt likely to be vagal in etiology.  Echocardiogram showed normal LV function.  If she has recurrences we will need to consider implantable loop monitor.  I have previously asked her to not drive for 6 months following her last episode.  2 hypertension-blood pressure is improving.  Continue present medications.  3 mild aortic stenosis-she will need follow-up echoes in the future.  4 coronary artery disease-based on previous CTA.  Continue statin.  5 hyperlipidemia-she is complaining of leg cramps.  She feels it may be related to labetalol.  I think Crestor would be more likely.  We have agreed that we will hold Crestor for 4 weeks.  If her symptoms do not improve we will resume.  If her symptoms persist despite holding Crestor we will consider changing blood pressure medications (possibly labetalol which she feels is the culprit causing leg cramps to hydralazine).  6 chronic dizziness-previous cardiac evaluation unremarkable.  Kirk Ruths, MD

## 2020-10-04 NOTE — Addendum Note (Signed)
Addended by: Elmer Picker on: 10/04/2020 10:31 AM   Modules accepted: Orders

## 2020-10-04 NOTE — Progress Notes (Signed)
MALLEY HAUTER DOB: August 25, 1933 Encounter date: 10/04/2020  This is a 87 y.o. female who presents with Chief Complaint  Patient presents with  . Follow-up    History of present illness:  Still having low back pain and leg cramp every night. She thinks that this is coming from the labetalol. She has continued to lose hair - eyebrows. She gets cramps in both of lower legs - takes awhile to go away and legs are sore the next day. Notes back more late in the afternoon. Feels like when you have your period - when back hurts. This pain is different with the back; doesn't feel MSK. Not no having as many episodes of the super low pressures (80's/60) - and comes up quickly. Usually come early in the morning and before taking medication. None of these in several weeks. Was getting them 2-3 times/week before. Sometimes in morning can get higher numbers like 150. If she rechecks then after meds she is down to "normal".   Stomach has been doing better. She hasn't followed up with GI since procedure, but still has problem with swallowing.   Vitamin B 12 was low last time - she has been taking supplement.    Allergies  Allergen Reactions  . Prilosec [Omeprazole] Cough  . Amlodipine     Feet and ankle swelling  . Protonix [Pantoprazole Sodium] Swelling  . Amoxicillin Rash  . Metronidazole Rash   Current Meds  Medication Sig  . acetaminophen (TYLENOL) 500 MG tablet Take 500-1,000 mg by mouth every 6 (six) hours as needed for mild pain or headache.   . cetirizine (ZYRTEC) 10 MG tablet Take 10 mg by mouth daily.  . Cholecalciferol 25 MCG (1000 UT) TBDP Take 1,000 Units by mouth daily.  Marland Kitchen esomeprazole (NEXIUM) 40 MG capsule Take 1 capsule (40 mg total) by mouth 2 (two) times daily before a meal.  . ezetimibe (ZETIA) 10 MG tablet TAKE 1 BY MOUTH DAILY  . furosemide (LASIX) 20 MG tablet 1 tablet by mouth every other day as needed for swelling  . labetalol (NORMODYNE) 100 MG tablet Take 1 tablet (100 mg  total) by mouth 2 (two) times daily.  Marland Kitchen losartan (COZAAR) 100 MG tablet Take 1 tablet (100 mg total) by mouth at bedtime.  . rosuvastatin (CRESTOR) 5 MG tablet Take 1/2 tablet by mouth daily.  Marland Kitchen spironolactone (ALDACTONE) 25 MG tablet Take 1 tablet (25 mg total) by mouth 2 (two) times daily.    Review of Systems  Constitutional: Negative for chills, fatigue and fever.  Respiratory: Negative for cough, chest tightness, shortness of breath and wheezing.   Cardiovascular: Negative for chest pain, palpitations and leg swelling.    Objective:  BP (!) 152/70 (BP Location: Left Arm, Patient Position: Sitting, Cuff Size: Large)   Pulse 60   Temp 98 F (36.7 C) (Oral)   Ht 5\' 2"  (1.575 m)   Wt 159 lb 9.6 oz (72.4 kg)   BMI 29.19 kg/m   Weight: 159 lb 9.6 oz (72.4 kg)   BP Readings from Last 3 Encounters:  10/04/20 (!) 152/70  07/29/20 124/80  07/19/20 (!) 128/58   Wt Readings from Last 3 Encounters:  10/04/20 159 lb 9.6 oz (72.4 kg)  07/29/20 156 lb (70.8 kg)  07/19/20 155 lb 6.4 oz (70.5 kg)    Physical Exam Constitutional:      General: She is not in acute distress.    Appearance: She is well-developed.  Cardiovascular:     Rate  and Rhythm: Normal rate and regular rhythm.     Heart sounds: Murmur heard.   Systolic murmur is present with a grade of 2/6. No friction rub.  Pulmonary:     Effort: Pulmonary effort is normal. No respiratory distress.     Breath sounds: Normal breath sounds. No wheezing or rales.  Musculoskeletal:     Right lower leg: No edema.     Left lower leg: No edema.  Neurological:     Mental Status: She is alert and oriented to person, place, and time.  Psychiatric:        Behavior: Behavior normal.     Assessment/Plan  1. Hypertension, essential, benign Has been more stable with pressures between 130-150/60-70. She will discuss with cardiology at upcoming appointment. This is the most stable her pressures have been on medication IN ADDITION to  her feeling better overall.  - Comprehensive metabolic panel; Future  2. Hyperparathyroidism ([place]) She prefers not to follow up with endocrinology unless labwork worsening.  - PTH, Intact and Calcium; Future  3. B12 deficiency - Vitamin B12; Future  4. Low back pain without sciatica, unspecified back pain laterality, unspecified chronicity She feels this is related to labetalol. We will check urine today and make sure no abnormalities that could be contributing. She will discuss labetalol with cardiology. She really would like to consider alternative medications for blood pressure, but she has had significant difficulty tolerating multiple bp medications.  - Urinalysis with Culture Reflex    Return in about 6 months (around 04/06/2021) for Chronic condition visit.     Micheline Rough, MD

## 2020-10-04 NOTE — Addendum Note (Signed)
Addended by: Raidon Swanner K on: 10/04/2020 10:31 AM   Modules accepted: Orders  

## 2020-10-05 ENCOUNTER — Other Ambulatory Visit: Payer: Self-pay | Admitting: Family Medicine

## 2020-10-05 DIAGNOSIS — I10 Essential (primary) hypertension: Secondary | ICD-10-CM

## 2020-10-06 LAB — URINALYSIS W MICROSCOPIC + REFLEX CULTURE
Bilirubin Urine: NEGATIVE
Glucose, UA: NEGATIVE
Hgb urine dipstick: NEGATIVE
Hyaline Cast: NONE SEEN /LPF
Ketones, ur: NEGATIVE
Leukocyte Esterase: NEGATIVE
Nitrites, Initial: POSITIVE — AB
Protein, ur: NEGATIVE
RBC / HPF: NONE SEEN /HPF (ref 0–2)
Specific Gravity, Urine: 1.016 (ref 1.001–1.035)
Squamous Epithelial / HPF: NONE SEEN /HPF (ref <=5)
WBC, UA: NONE SEEN /HPF (ref 0–5)
pH: 8.5 — ABNORMAL HIGH (ref 5.0–8.0)

## 2020-10-06 LAB — PTH, INTACT AND CALCIUM
Calcium: 10.6 mg/dL — ABNORMAL HIGH (ref 8.6–10.4)
PTH: 73 pg/mL (ref 16–77)

## 2020-10-06 LAB — NO CULTURE INDICATED

## 2020-10-13 NOTE — Addendum Note (Signed)
Addended by: Agnes Lawrence on: 10/13/2020 12:40 PM   Modules accepted: Orders

## 2020-10-15 ENCOUNTER — Other Ambulatory Visit: Payer: Self-pay

## 2020-10-18 ENCOUNTER — Other Ambulatory Visit: Payer: Self-pay

## 2020-10-18 ENCOUNTER — Encounter: Payer: Self-pay | Admitting: Cardiology

## 2020-10-18 ENCOUNTER — Other Ambulatory Visit (INDEPENDENT_AMBULATORY_CARE_PROVIDER_SITE_OTHER): Payer: PPO

## 2020-10-18 ENCOUNTER — Ambulatory Visit: Payer: PPO | Admitting: Cardiology

## 2020-10-18 VITALS — BP 146/62 | HR 60 | Ht 62.0 in | Wt 157.8 lb

## 2020-10-18 DIAGNOSIS — I10 Essential (primary) hypertension: Secondary | ICD-10-CM | POA: Diagnosis not present

## 2020-10-18 DIAGNOSIS — E7800 Pure hypercholesterolemia, unspecified: Secondary | ICD-10-CM

## 2020-10-18 DIAGNOSIS — M545 Low back pain, unspecified: Secondary | ICD-10-CM | POA: Diagnosis not present

## 2020-10-18 DIAGNOSIS — R55 Syncope and collapse: Secondary | ICD-10-CM

## 2020-10-18 DIAGNOSIS — I350 Nonrheumatic aortic (valve) stenosis: Secondary | ICD-10-CM

## 2020-10-18 NOTE — Patient Instructions (Signed)
Medication Instructions:   DO NOT TAKE ROSUVASTATIN FOR 4 WEEKS AND THEN CALL TO LET us KNOW IF SYMPTOMS ARE BETTER  *If you need a refill on your cardiac medications before your next appointment, please call your pharmacy*   Follow-Up: At [facility], you and your health needs are our priority.  As part of our continuing mission to provide you with exceptional heart care, we have created designated Provider Care Teams.  These Care Teams include your primary Cardiologist (physician) and Advanced Practice Providers (APPs -  Physician Assistants and Nurse Practitioners) who all work together to provide you with the care you need, when you need it.  We recommend signing up for the patient portal called "MyChart".  Sign up information is provided on this After Visit Summary.  MyChart is used to connect with patients for Virtual Visits (Telemedicine).  Patients are able to view lab/test results, encounter notes, upcoming appointments, etc.  Non-urgent messages can be sent to your provider as well.   To learn more about what you can do with MyChart, go to [email or web address].    Your next appointment:   6 month(s)  The format for your next appointment:   In Person  Provider:   Kirk Ruths, MD

## 2020-10-19 LAB — URINE CULTURE
MICRO NUMBER:: 11973116
SPECIMEN QUALITY:: ADEQUATE

## 2020-11-08 DIAGNOSIS — I10 Essential (primary) hypertension: Secondary | ICD-10-CM

## 2020-11-08 MED ORDER — LOSARTAN POTASSIUM 100 MG PO TABS: 50.0000 mg | ORAL_TABLET | Freq: Every day | ORAL | 3 refills | Status: DC

## 2020-11-16 ENCOUNTER — Encounter: Payer: Self-pay | Admitting: Family Medicine

## 2020-11-16 DIAGNOSIS — E7800 Pure hypercholesterolemia, unspecified: Secondary | ICD-10-CM

## 2020-11-17 ENCOUNTER — Other Ambulatory Visit: Payer: Self-pay

## 2020-11-17 DIAGNOSIS — I10 Essential (primary) hypertension: Secondary | ICD-10-CM

## 2020-11-17 MED ORDER — EZETIMIBE 10 MG PO TABS: ORAL_TABLET | ORAL | 1 refills | Status: DC

## 2020-11-17 MED ORDER — SPIRONOLACTONE 25 MG PO TABS: 25.0000 mg | ORAL_TABLET | Freq: Two times a day (BID) | ORAL | 3 refills | Status: DC

## 2020-11-24 ENCOUNTER — Other Ambulatory Visit: Payer: Self-pay

## 2020-11-24 ENCOUNTER — Ambulatory Visit (INDEPENDENT_AMBULATORY_CARE_PROVIDER_SITE_OTHER): Payer: PPO

## 2020-11-24 DIAGNOSIS — Z0000 Encounter for general adult medical examination without abnormal findings: Secondary | ICD-10-CM | POA: Diagnosis not present

## 2020-11-24 NOTE — Progress Notes (Addendum)
Virtual Visit via Telephone Note  I connected with  Robin [name] on 11/24/20 at  9:30 AM EDT by telephone and verified that I am speaking with the correct person using two identifiers.  Medicare Annual Wellness visit completed telephonically due to Covid-19 pandemic.   Persons participating in this call: This Health Coach and this patient.   Location: Patient: Home Provider: Office   I discussed the limitations, risks, security and privacy concerns of performing an evaluation and management service by telephone and the availability of in person appointments. The patient expressed understanding and agreed to proceed.  Unable to perform video visit due to video visit attempted and failed and/or patient does not have video capability.   Some vital signs may be absent or patient reported.   [name], LPN   Subjective:   Robin [name] is a 87 y.o. female who presents for Medicare Annual (Subsequent) preventive examination.  Review of Systems     Cardiac Risk Factors include: advanced age (>62men, >72 women);hypertension;dyslipidemia     Objective:    There were no vitals filed for this visit. There is no height or weight on file to calculate BMI.  Advanced Directives 11/24/2020 11/24/2019 10/31/2018 07/15/2018 10/02/2017 02/13/2017 07/02/2015  Does Patient Have a Medical Advance Directive? No No Yes No Yes No No  Type of Advance Directive - - Living will - Living will - -  Does patient want to make changes to medical advance directive? - - No - Patient declined - No - Patient declined - -  Would patient like information on creating a medical advance directive? No - Patient declined No - Patient declined - No - Patient declined - - No - patient declined information  Pre-existing out of facility DNR order (yellow form or pink MOST form) - - - - - - -    Current Medications (verified) Outpatient Encounter Medications as of 11/24/2020  Medication Sig   acetaminophen (TYLENOL)  500 MG tablet Take 500-1,000 mg by mouth every 6 (six) hours as needed for mild pain or headache.    cetirizine (ZYRTEC) 10 MG tablet Take 10 mg by mouth daily.   Cholecalciferol 25 MCG (1000 UT) TBDP Take 1,000 Units by mouth daily.   esomeprazole (NEXIUM) 40 MG capsule Take 1 capsule (40 mg total) by mouth 2 (two) times daily before a meal.   ezetimibe (ZETIA) 10 MG tablet TAKE 1 BY MOUTH DAILY   furosemide (LASIX) 20 MG tablet 1 tablet by mouth every other day as needed for swelling   labetalol (NORMODYNE) 100 MG tablet Take 1 tablet (100 mg total) by mouth 2 (two) times daily.   losartan (COZAAR) 100 MG tablet Take 0.5 tablets (50 mg total) by mouth at bedtime.   rosuvastatin (CRESTOR) 5 MG tablet Take 1/2 tablet by mouth daily.   spironolactone (ALDACTONE) 25 MG tablet Take 1 tablet (25 mg total) by mouth 2 (two) times daily.   No facility-administered encounter medications on file as of 11/24/2020.    Allergies (verified) Prilosec [omeprazole], Amlodipine, Protonix [pantoprazole sodium], Amoxicillin, and Metronidazole   History: Past Medical History:  Diagnosis Date   Abnormal Pap smear of cervix    Allergy    Arthritis    Asthma    Cataract 2018   bilateral eyes   Colon polyps    tubulovillous adenoma, 2010   External hemorrhoids without mention of complication    GERD (gastroesophageal reflux disease)    Headache    eval with  neuro in 2011   Hyperlipemia    Hyperparathyroidism ([place])    seeing Dr. Dwyane Dee in endocrinology   Hypertension    IBS (irritable bowel syndrome), GERD, hx colon polyps, chronic abd pain - followed by Dr. Sharlett Iles in GI 06/11/2012   Osteoporosis 08/29/2011   Vertigo    eval with neuro in 2011, brief recurrence 2016   Past Surgical History:  Procedure Laterality Date   BACK SURGERY  1974   CARPAL TUNNEL RELEASE Left 06/2008   COLONOSCOPY     EYE SURGERY  07/2017   bilateral cataract extraction   POLYPECTOMY     TONSILLECTOMY     UPPER  GASTROINTESTINAL ENDOSCOPY     Family History  Problem Relation Age of Onset   Diabetes Brother    Hypertension Brother    Stroke Brother 38   Hypertension Father    Stroke Father    Diabetes Sister    Hypertension Sister    Heart disease Sister        heart failure   CAD Mother        Died of MI at age 11   Hypertension Mother    CAD Brother        Died of MI at age 81   Hypertension Brother    Stroke Sister 41   Hypertension Sister    Stroke Brother 77   Hypertension Brother    Colon cancer Neg Hx    Colon polyps Neg Hx    Esophageal cancer Neg Hx    Pancreatic cancer Neg Hx    Stomach cancer Neg Hx    Rectal cancer Neg Hx    Social History   Socioeconomic History   Marital status: Widowed    Spouse name: Not on file   Number of children: 2   Years of education: Not on file   Highest education level: Not on file  Occupational History   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  Tobacco Use   Smoking status: Never   Smokeless tobacco: Never  Vaping Use   Vaping Use: Never used  Substance and Sexual Activity   Alcohol use: No    Alcohol/week: 0.0 standard drinks   Drug use: No   Sexual activity: Not Currently    Birth control/protection: Post-menopausal  Other Topics Concern   Not on file  Social History Narrative   Lives alone in a one story home.  Has 2 children, one son and one daughter with 2 grandkids and 1 great-grandchild.    Retired from working for a bank.     Education: high school.    Social Determinants of Health   Financial Resource Strain: Low Risk    Difficulty of Paying Living Expenses: Not hard at all  Food Insecurity: No Food Insecurity   Worried About Charity fundraiser in the Last Year: Never true   [place] in the Last Year: Never true  Transportation Needs: No Transportation Needs   Lack of Transportation (Medical): No   Lack of Transportation (Non-Medical): No  Physical Activity: Inactive    Days of Exercise per Week: 0 days   Minutes of Exercise per Session: 0 min  Stress: No Stress Concern Present   Feeling of Stress : Not at all  Social Connections: Socially Isolated   Frequency of Communication with Friends and Family: More than three times a week   Frequency of Social Gatherings with Friends and Family: Once  a week   Attends Religious Services: Never   Active Member of Clubs or Organizations: No   Attends Archivist Meetings: Never   Marital Status: Widowed    Tobacco Counseling Counseling given: Not Answered   Clinical Intake:  Pre-visit preparation completed: Yes  Pain : No/denies pain     BMI - recorded: 28.86 Nutritional Status: BMI 25 -29 Overweight Nutritional Risks: None Diabetes: No  How often do you need to have someone help you when you read instructions, pamphlets, or other written materials from your doctor or pharmacy?: 1 - Never  Diabetic?No  Interpreter Needed?: No  Information entered by :: Charlott Rakes, LPN   Activities of Daily Living In your present state of health, do you have any difficulty performing the following activities: 11/24/2020  Hearing? N  Vision? N  Difficulty concentrating or making decisions? N  Walking or climbing stairs? N  Dressing or bathing? N  Doing errands, shopping? N  Preparing Food and eating ? N  Using the Toilet? N  In the past six months, have you accidently leaked urine? N  Do you have problems with loss of bowel control? N  Managing your Medications? N  Managing your Finances? N  Housekeeping or managing your Housekeeping? N  Some recent data might be hidden    Patient Care Team: [name], MD as PCP - General (Family Medicine) [name] [name], MD as PCP - Cardiology (Cardiology) [name], MD as Consulting Physician (Gastroenterology) [name] [name], MD as Consulting Physician (Cardiology) [name], MD as Consulting Physician  (Ophthalmology) [name], MD as Consulting Physician (Gynecology)  Indicate any recent Medical Services you may have received from other than [facility] providers in the past year (date may be approximate).     Assessment:   This is a routine wellness examination for [place].  Hearing/Vision screen Hearing Screening - Comments:: Pt denies any hearing issues  Vision Screening - Comments:: Pt followsu p with Dr  Tia Masker for annual eye exams  Dietary issues and exercise activities discussed: Current Exercise Habits: The patient does not participate in regular exercise at present   Goals Addressed             This Visit's Progress    Patient Stated       None at this time        Depression Screen PHQ 2/9 Scores 11/24/2020 11/24/2019 10/22/2018 07/15/2018 02/13/2017 12/06/2015 08/10/2014  PHQ - 2 Score 0 0 0 0 0 0 0  PHQ- 9 Score - 0 - 0 - - -    Fall Risk Fall Risk  11/24/2020 11/24/2019 07/15/2018 07/15/2018 02/13/2017  Falls in the past year? 0 0 0 0 No  Number falls in past yr: 0 0 - 0 -  Injury with Fall? 0 0 - 0 -  Risk for fall due to : Impaired vision Medication side effect - - -  Follow up Falls prevention discussed Falls evaluation completed;Falls prevention discussed - - -    FALL RISK PREVENTION PERTAINING TO THE HOME:  Any stairs in or around the home? Yes  If so, are there any without handrails? Yes  Home free of loose throw rugs in walkways, pet beds, electrical cords, etc? Yes  Adequate lighting in your home to reduce risk of falls? Yes   ASSISTIVE DEVICES UTILIZED TO PREVENT FALLS:  Life alert? No  Use of a cane, walker or w/c? No  Grab bars in the bathroom? No  Shower chair or bench in shower? No  Elevated toilet seat or a handicapped toilet? No   TIMED UP AND GO:  Was the test performed? No     Cognitive Function: MMSE - Mini Mental State Exam 02/13/2017  Not completed: (No Data)     6CIT Screen 11/24/2020 11/24/2019  What Year? 0 points 0 points  What  month? 0 points 0 points  What time? 0 points 0 points  Count back from 20 0 points 0 points  Months in reverse 0 points 0 points  Repeat phrase 0 points 0 points  Total Score 0 0    Immunizations Immunization History  Administered Date(s) Administered   Fluad Quad(high Dose 65+) 03/04/2019, 02/02/2020   Influenza, High Dose Seasonal PF 03/23/2015, 02/22/2016, 02/13/2017, 03/11/2018   Influenza,inj,Quad PF,6+ Mos 03/07/2013, 03/03/2014   PFIZER(Purple Top)SARS-COV-2 Vaccination 06/21/2019, 07/12/2019, 03/30/2020   Pneumococcal Conjugate-13 08/10/2014   Pneumococcal Polysaccharide-23 07/23/2012   Tdap 07/23/2012   Zoster, Live 09/30/2013    TDAP status: Up to date  Flu Vaccine status: Up to date  Pneumococcal vaccine status: Up to date  Covid-19 vaccine status: Completed vaccines  Qualifies for Shingles Vaccine? Yes   Zostavax completed Yes   Shingrix Completed?: No.    Education has been provided regarding the importance of this vaccine. Patient has been advised to call insurance company to determine out of pocket expense if they have not yet received this vaccine. Advised may also receive vaccine at local pharmacy or Health Dept. Verbalized acceptance and understanding.  Screening Tests Health Maintenance  Topic Date Due   Zoster Vaccines- Shingrix (1 of 2) Never done   COVID-19 Vaccine (4 - Booster for Pfizer series) 07/28/2020   INFLUENZA VACCINE  12/13/2020   TETANUS/TDAP  07/24/2022   DEXA SCAN  Completed   PNA vac Low Risk Adult  Completed   HPV VACCINES  Aged Out    Health Maintenance  Health Maintenance Due  Topic Date Due   Zoster Vaccines- Shingrix (1 of 2) Never done   COVID-19 Vaccine (4 - Booster for Pfizer series) 07/28/2020    Colorectal cancer screening: No longer required.   Mammogram status: No longer required due to age.  Bone Density status: Completed 03/18/19. Results reflect: Bone density results: OSTEOPOROSIS. Repeat every 2  years.   Additional Screening:  Vision Screening: Recommended annual ophthalmology exams for early detection of glaucoma and other disorders of the eye. Is the patient up to date with their annual eye exam?  Yes  Who is the provider or what is the name of the office in which the patient attends annual eye exams? Dr Satira Sark If pt is not established with a provider, would they like to be referred to a provider to establish care? No .   Dental Screening: Recommended annual dental exams for proper oral hygiene  Community Resource Referral / Chronic Care Management: CRR required this visit?  No   CCM required this visit?  No      Plan:     I have personally reviewed and noted the following in the patient's chart:   Medical and social history Use of alcohol, tobacco or illicit drugs  Current medications and supplements including opioid prescriptions.  Functional ability and status Nutritional status Physical activity Advanced directives List of other physicians Hospitalizations, surgeries, and ER visits in previous 12 months Vitals Screenings to include cognitive, depression, and falls Referrals and appointments  In addition, I have reviewed and discussed with patient certain preventive protocols,  quality metrics, and best practice recommendations. A written personalized care plan for preventive services as well as general preventive health recommendations were provided to patient.     [name], LPN   [id number]   Nurse Notes: None

## 2020-11-24 NOTE — Patient Instructions (Signed)
Ms. Funderburke , Thank you for taking time to come for your Medicare Wellness Visit. I appreciate your ongoing commitment to your health goals. Please review the following plan we discussed and let me know if I can assist you in the future.   Screening recommendations/referrals: Colonoscopy: No longer required  Mammogram: Done 02/09/20 no longer required  Bone Density: Done 03/18/19 repeat every 2 years  Recommended yearly ophthalmology/optometry visit for glaucoma screening and checkup Recommended yearly dental visit for hygiene and checkup  Vaccinations: Influenza vaccine: Due 12/13/20 Pneumococcal vaccine: Completed Tdap vaccine: Done 07/23/12 repeat every 10 years 07/24/22 Shingles vaccine: Shingrix discussed. Please contact your pharmacy for coverage information.    Covid-19:Completed 2/6, 2/27, & 03/30/20  Advanced directives: Advance directive discussed with you today. Even though you declined this today please call our office should you change your mind and we can give you the proper paperwork for you to fill out.  Conditions/risks identified: None at thistime  Next appointment: Follow up in one year for your annual wellness visit    Preventive Care 65 Years and Older, Female Preventive care refers to lifestyle choices and visits with your health care provider that can promote health and wellness. What does preventive care include? A yearly physical exam. This is also called an annual well check. Dental exams once or twice a year. Routine eye exams. Ask your health care provider how often you should have your eyes checked. Personal lifestyle choices, including: Daily care of your teeth and gums. Regular physical activity. Eating a healthy diet. Avoiding tobacco and drug use. Limiting alcohol use. Practicing safe sex. Taking low-dose aspirin every day. Taking vitamin and mineral supplements as recommended by your health care provider. What happens during an annual well  check? The services and screenings done by your health care provider during your annual well check will depend on your age, overall health, lifestyle risk factors, and family history of disease. Counseling  Your health care provider may ask you questions about your: Alcohol use. Tobacco use. Drug use. Emotional well-being. Home and relationship well-being. Sexual activity. Eating habits. History of falls. Memory and ability to understand (cognition). Work and work Statistician. Reproductive health. Screening  You may have the following tests or measurements: Height, weight, and BMI. Blood pressure. Lipid and cholesterol levels. These may be checked every 5 years, or more frequently if you are over 26 years old. Skin check. Lung cancer screening. You may have this screening every year starting at age 27 if you have a 30-pack-year history of smoking and currently smoke or have quit within the past 15 years. Fecal occult blood test (FOBT) of the stool. You may have this test every year starting at age 59. Flexible sigmoidoscopy or colonoscopy. You may have a sigmoidoscopy every 5 years or a colonoscopy every 10 years starting at age 36. Hepatitis C blood test. Hepatitis B blood test. Sexually transmitted disease (STD) testing. Diabetes screening. This is done by checking your blood sugar (glucose) after you have not eaten for a while (fasting). You may have this done every 1-3 years. Bone density scan. This is done to screen for osteoporosis. You may have this done starting at age 94. Mammogram. This may be done every 1-2 years. Talk to your health care provider about how often you should have regular mammograms. Talk with your health care provider about your test results, treatment options, and if necessary, the need for more tests. Vaccines  Your health care provider may recommend certain vaccines, such  as: Influenza vaccine. This is recommended every year. Tetanus, diphtheria, and  acellular pertussis (Tdap, Td) vaccine. You may need a Td booster every 10 years. Zoster vaccine. You may need this after age 26. Pneumococcal 13-valent conjugate (PCV13) vaccine. One dose is recommended after age 11. Pneumococcal polysaccharide (PPSV23) vaccine. One dose is recommended after age 46. Talk to your health care provider about which screenings and vaccines you need and how often you need them. This information is not intended to replace advice given to you by your health care provider. Make sure you discuss any questions you have with your health care provider. Document Released: 05/28/2015 Document Revised: 01/19/2016 Document Reviewed: 03/02/2015 Elsevier Interactive Patient Education  2017 [employer] Prevention in the Home Falls can cause injuries. They can happen to people of all ages. There are many things you can do to make your home safe and to help prevent falls. What can I do on the outside of my home? Regularly fix the edges of walkways and driveways and fix any cracks. Remove anything that might make you trip as you walk through a door, such as a raised step or threshold. Trim any bushes or trees on the path to your home. Use bright outdoor lighting. Clear any walking paths of anything that might make someone trip, such as rocks or tools. Regularly check to see if handrails are loose or broken. Make sure that both sides of any steps have handrails. Any raised decks and porches should have guardrails on the edges. Have any leaves, snow, or ice cleared regularly. Use sand or salt on walking paths during winter. Clean up any spills in your garage right away. This includes oil or grease spills. What can I do in the bathroom? Use night lights. Install grab bars by the toilet and in the tub and shower. Do not use towel bars as grab bars. Use non-skid mats or decals in the tub or shower. If you need to sit down in the shower, use a plastic, non-slip stool. Keep  the floor dry. Clean up any water that spills on the floor as soon as it happens. Remove soap buildup in the tub or shower regularly. Attach bath mats securely with double-sided non-slip rug tape. Do not have throw rugs and other things on the floor that can make you trip. What can I do in the bedroom? Use night lights. Make sure that you have a light by your bed that is easy to reach. Do not use any sheets or blankets that are too big for your bed. They should not hang down onto the floor. Have a firm chair that has side arms. You can use this for support while you get dressed. Do not have throw rugs and other things on the floor that can make you trip. What can I do in the kitchen? Clean up any spills right away. Avoid walking on wet floors. Keep items that you use a lot in easy-to-reach places. If you need to reach something above you, use a strong step stool that has a grab bar. Keep electrical cords out of the way. Do not use floor polish or wax that makes floors slippery. If you must use wax, use non-skid floor wax. Do not have throw rugs and other things on the floor that can make you trip. What can I do with my stairs? Do not leave any items on the stairs. Make sure that there are handrails on both sides of the stairs and use  them. Fix handrails that are broken or loose. Make sure that handrails are as long as the stairways. Check any carpeting to make sure that it is firmly attached to the stairs. Fix any carpet that is loose or worn. Avoid having throw rugs at the top or bottom of the stairs. If you do have throw rugs, attach them to the floor with carpet tape. Make sure that you have a light switch at the top of the stairs and the bottom of the stairs. If you do not have them, ask someone to add them for you. What else can I do to help prevent falls? Wear shoes that: Do not have high heels. Have rubber bottoms. Are comfortable and fit you well. Are closed at the toe. Do not  wear sandals. If you use a stepladder: Make sure that it is fully opened. Do not climb a closed stepladder. Make sure that both sides of the stepladder are locked into place. Ask someone to hold it for you, if possible. Clearly mark and make sure that you can see: Any grab bars or handrails. First and last steps. Where the edge of each step is. Use tools that help you move around (mobility aids) if they are needed. These include: Canes. Walkers. Scooters. Crutches. Turn on the lights when you go into a dark area. Replace any light bulbs as soon as they burn out. Set up your furniture so you have a clear path. Avoid moving your furniture around. If any of your floors are uneven, fix them. If there are any pets around you, be aware of where they are. Review your medicines with your doctor. Some medicines can make you feel dizzy. This can increase your chance of falling. Ask your doctor what other things that you can do to help prevent falls. This information is not intended to replace advice given to you by your health care provider. Make sure you discuss any questions you have with your health care provider. Document Released: 02/25/2009 Document Revised: 10/07/2015 Document Reviewed: 06/05/2014 Elsevier Interactive Patient Education  2017 [employer].

## 2020-11-26 ENCOUNTER — Telehealth: Payer: Self-pay | Admitting: Cardiology

## 2020-11-26 NOTE — Telephone Encounter (Signed)
*  STAT* If patient is at the pharmacy, call can be transferred to refill team.   1. Which medications need to be refilled? (please list name of each medication and dose if known) spironolactone (ALDACTONE) 25 MG tablet  2. Which pharmacy/location (including street and city if local pharmacy) is medication to be sent to? [facility] - [place], [place]  3. Do they need a 30 day or 90 day supply? 90 day supply  Pt is out of this medication

## 2020-12-03 ENCOUNTER — Ambulatory Visit (INDEPENDENT_AMBULATORY_CARE_PROVIDER_SITE_OTHER): Payer: PPO | Admitting: Family Medicine

## 2020-12-03 ENCOUNTER — Encounter: Payer: Self-pay | Admitting: Family Medicine

## 2020-12-03 ENCOUNTER — Other Ambulatory Visit: Payer: Self-pay

## 2020-12-03 VITALS — BP 150/78 | HR 61 | Temp 98.3°F | Ht 62.0 in | Wt 159.5 lb

## 2020-12-03 DIAGNOSIS — L659 Nonscarring hair loss, unspecified: Secondary | ICD-10-CM

## 2020-12-03 DIAGNOSIS — I10 Essential (primary) hypertension: Secondary | ICD-10-CM

## 2020-12-03 DIAGNOSIS — R252 Cramp and spasm: Secondary | ICD-10-CM | POA: Diagnosis not present

## 2020-12-03 DIAGNOSIS — Z23 Encounter for immunization: Secondary | ICD-10-CM | POA: Diagnosis not present

## 2020-12-03 NOTE — Patient Instructions (Signed)
Keep holding the crestor; also hold the zetia.   Cut the labetalol in half - start doing 1/2 tab twice daily.   Update me through mychart in a week with how you feel, how cramps are, how pressures look (notify me sooner if you are seeing regular home pressures over Q000111Q systolic). We will decide next step from there.   I would not check home pressures more than 2-3 times/day.

## 2020-12-03 NOTE — Progress Notes (Signed)
Robin [name] DOB: [birth year] Encounter date: 12/03/2020  This is a 87 y.o. female who presents with Chief Complaint  Patient presents with   Leg Pain    Patient complains of bilateral leg pain and cramps since the last visit in February     History of present illness: Has worsened with pain in legs and lower back; just hurting. Bp up because she is hurting. Feels if she wasn't hurting it would be better. Now scalp itching. Feels related to labetalol. Just "can't deal with it" anymore. When it started, she just had one cramp, but now legs are throbbing in pain. Going to the bathroom at least 5 times/night. Feels like she is going more at night. Not urinating much when she goes. Doesn't look at urine, but not noted color change in day when she can see.   Knees down; feels like charlie horses. When she sits down at night states that there are "sunk in" places on legs and veins are a little more prominent.   Usually checks bp in morning with breakfast. Bp usulaly high. 30-45 minutes after she takes medicine she feels hot, like she is going to faint. Bp drops really low - like 97/mid 50's or up to [id number] systolic. She has continued with these low pressures even after cutting losartan in half. Not sure if she has same sensation in evening because she goes to bed right after taking medication. Cramps started at night, but now feels like legs hurt day and night. Everything is knee down.   Highest pressure that she has seen was 170's at home.   She did hold crestor x4 weeks since seeing Dr. [name] 6/6. No improvement in leg cramps with this. Then he cut down on losartan to half tablet.    Allergies  Allergen Reactions   Prilosec [Omeprazole] Cough   Amlodipine     Feet and ankle swelling   Protonix [Pantoprazole Sodium] Swelling   Amoxicillin Rash   Metronidazole Rash   Current Meds  Medication Sig   acetaminophen (TYLENOL) 500 MG tablet Take 500-1,000 mg by mouth every 6 (six) hours as  needed for mild pain or headache.    cetirizine (ZYRTEC) 10 MG tablet Take 10 mg by mouth daily.   Cholecalciferol 25 MCG (1000 UT) TBDP Take 1,000 Units by mouth daily.   esomeprazole (NEXIUM) 40 MG capsule Take 1 capsule (40 mg total) by mouth 2 (two) times daily before a meal.   ezetimibe (ZETIA) 10 MG tablet TAKE 1 BY MOUTH DAILY   furosemide (LASIX) 20 MG tablet 1 tablet by mouth every other day as needed for swelling   labetalol (NORMODYNE) 100 MG tablet Take 1 tablet (100 mg total) by mouth 2 (two) times daily.   losartan (COZAAR) 100 MG tablet Take 0.5 tablets (50 mg total) by mouth at bedtime.   spironolactone (ALDACTONE) 25 MG tablet Take 1 tablet (25 mg total) by mouth 2 (two) times daily.    Review of Systems  Constitutional:  Positive for fatigue (esp with low pressures). Negative for chills and fever.  Respiratory:  Negative for cough, chest tightness, shortness of breath and wheezing.   Cardiovascular:  Negative for chest pain, palpitations and leg swelling.  Musculoskeletal:  Positive for arthralgias (lower legs only).   Objective:  BP (!) 150/78 (BP Location: Left Arm, Patient Position: Sitting, Cuff Size: Large)   Pulse 61   Temp 98.3 F (36.8 C) (Oral)   Ht '5\' 2"'$  (1.575 m)   Wt  159 lb 8 oz (72.3 kg)   SpO2 98%   BMI 29.17 kg/m   Weight: 159 lb 8 oz (72.3 kg)   BP Readings from Last 3 Encounters:  12/03/20 (!) 150/78  10/18/20 (!) 146/62  10/04/20 (!) 152/70   Wt Readings from Last 3 Encounters:  12/03/20 159 lb 8 oz (72.3 kg)  10/18/20 157 lb 12.8 oz (71.6 kg)  10/04/20 159 lb 9.6 oz (72.4 kg)    Physical Exam Constitutional:      General: She is not in acute distress.    Appearance: She is well-developed.  Cardiovascular:     Rate and Rhythm: Normal rate and regular rhythm.     Pulses:          Dorsalis pedis pulses are 2+ on the right side and 2+ on the left side.       Posterior tibial pulses are 2+ on the right side and 2+ on the left side.      Heart sounds: Normal heart sounds. No murmur heard.   No friction rub.     Comments: Feet and toes are warm and well perfused Pulmonary:     Effort: Pulmonary effort is normal. No respiratory distress.     Breath sounds: Normal breath sounds. No wheezing or rales.  Musculoskeletal:     Right lower leg: No edema.     Left lower leg: No edema.  Neurological:     Mental Status: She is alert and oriented to person, place, and time.  Psychiatric:        Behavior: Behavior normal.    Assessment/Plan  1. Hypertension, essential, benign Patient has longstanding history of blood pressure that has been difficult to treat due to intolerance of medications.  She does not want to be on labetalol any longer and feels that this is contributing to her not feeling well.  She is still getting hypotensive after taking the labetalol.  She feels that leg cramps are related to labetalol due to timing of starting that medication.  Her blood pressure today is reasonable, given some of her elevated pressures in the past.  I have told her to decrease her labetalol to half a tablet twice daily to see if this is the next week.  Continue to record blood pressures at home.  Cutting her losartan from 100 mg to 50 mg has not helped with hypotensive episodes after labetalol, so we may be able to adjust this back up if needed.  She does desire to have blood pressure controlled and wishes to avoid stroke, which is something that she worries about. We will have to work with past meds/intolerances pending her update in a week.   2. Leg cramps She did not have any significantly abnormal labs when we last checked to explain cramping.  We discussed that if no improvement in 1 week's time, we will get some additional lab work.  I am going to have her hold her Zetia (she has already been holding the Crestor) to see if this gives any relief.  We discussed hydrating herself better, which she is not good about.  She has looked slightly  dehydrated on previous urinalysis.  3. Hair loss Her hair loss has stabilized somewhat.  This is something that she worries about, but in the end states that she is more concerned with blood pressure control.  She does not feel that she is continuing to lose excessive amounts of hair, although hair overall is thinner.   Return in  about 1 month (around 01/03/2021) for Chronic condition visit. She will update me in 1 week's time to MyChart to let me know how she feels that have dyslipidemia well and with holding both Crestor and Zetia.  She will update me with blood pressures as well at that time.  And in chart review, reviewing prior medications that she has been intolerant to, discussing medications that she will not take in the future, and discussing follow-up treatment plan.   Micheline Rough, MD

## 2020-12-06 ENCOUNTER — Telehealth: Payer: Self-pay | Admitting: Cardiology

## 2020-12-06 DIAGNOSIS — I10 Essential (primary) hypertension: Secondary | ICD-10-CM

## 2020-12-06 MED ORDER — SPIRONOLACTONE 25 MG PO TABS: 25.0000 mg | ORAL_TABLET | Freq: Two times a day (BID) | ORAL | 3 refills | Status: DC

## 2020-12-06 NOTE — Telephone Encounter (Signed)
Sent refill for spironolactone to [facility] 12/06/20

## 2020-12-06 NOTE — Telephone Encounter (Signed)
*  STAT* If patient is at the pharmacy, call can be transferred to refill team.   1. Which medications need to be refilled? (please list name of each medication and dose if known)  spironolactone (ALDACTONE) 25 MG tablet  2. Which pharmacy/location (including street and city if local pharmacy) is medication to be sent to?  [facility] - [place], [place]  3. Do they need a 30 day or 90 day supply?  90 day supply  Pt states this prescription order was sent on 11/17/20 and 11/26/20 but the pharmacy [facility] has not received the script yet, pt would like a callback so she will know what her next steps should be getting this medicine

## 2020-12-08 ENCOUNTER — Encounter: Payer: Self-pay | Admitting: Family Medicine

## 2020-12-08 DIAGNOSIS — I10 Essential (primary) hypertension: Secondary | ICD-10-CM

## 2020-12-09 ENCOUNTER — Encounter: Payer: Self-pay | Admitting: Family Medicine

## 2020-12-14 ENCOUNTER — Telehealth: Payer: Self-pay | Admitting: Family Medicine

## 2020-12-14 NOTE — Chronic Care Management (AMB) (Signed)
  Chronic Care Management   Outreach Note  12/14/2020 Name: Robin [name] MRN: [id number] DOB: 07-Mar-1934  Referred by: [name], MD Reason for referral : No chief complaint on file.   An unsuccessful telephone outreach was attempted today. The patient was referred to the pharmacist for assistance with care management and care coordination.   Follow Up Plan:   Tatjana Dellinger Upstream Scheduler

## 2020-12-14 NOTE — Progress Notes (Signed)
  Chronic Care Management   Note  12/14/2020 Name: LAKEISHIA MALANEY MRN: [id number] DOB: [birth year]  [place] is a 87 y.o. year old female who is a primary care patient of [name], [name], MD. I reached out to [place] Lions by phone today in response to a referral sent by Ms. Inda Merlin Peden's PCP, [name], MD.   Ms. Boney was given information about Chronic Care Management services today including:  CCM service includes personalized support from designated clinical staff supervised by her physician, including individualized plan of care and coordination with other care providers 24/7 contact phone numbers for assistance for urgent and routine care needs. Service will only be billed when office clinical staff spend 20 minutes or more in a month to coordinate care. Only one practitioner may furnish and bill the service in a calendar month. The patient may stop CCM services at any time (effective at the end of the month) by phone call to the office staff.   Patient agreed to services and verbal consent obtained.   Follow up plan:   Tatjana Secretary/administrator

## 2020-12-17 ENCOUNTER — Encounter: Payer: Self-pay | Admitting: Family Medicine

## 2020-12-18 MED ORDER — NEBIVOLOL HCL 5 MG PO TABS: 5.0000 mg | ORAL_TABLET | Freq: Every day | ORAL | 3 refills | Status: DC

## 2020-12-20 ENCOUNTER — Telehealth: Payer: Self-pay | Admitting: Cardiology

## 2020-12-20 DIAGNOSIS — I10 Essential (primary) hypertension: Secondary | ICD-10-CM

## 2020-12-20 MED ORDER — SPIRONOLACTONE 25 MG PO TABS: 25.0000 mg | ORAL_TABLET | Freq: Two times a day (BID) | ORAL | 3 refills | Status: DC

## 2020-12-20 NOTE — Telephone Encounter (Signed)
Pt is following up on getting a New Prescription sent to [facility] order pharmacy, pt states she first put this request in on 12/06/20 and was told that the script was sent to a local pharmacy and they will fwd the script to [facility]. Pt called [facility] Saturday 12/18/20 and was advised that they have not received  a New Prescription for this medicine. Please advise pt further [phone]    Please fwd New Script to: [facility]  [phone]

## 2020-12-20 NOTE — Telephone Encounter (Signed)
Returned call to patient who states that she was told that her prescription for Spironolactone '25mg'$  Twice daily has been sent to [facility] order pharmacy. Upon chart review it looks as if it was not sent in or received by [facility]. Advised patient that I would send this into [facility] right now. Patient currently does have '50mg'$  tablets of Spironolactone that she is splitting in half for the time being. Advised patient that if she has any trouble or issues receiving medication to call back to office. Patient verbalized understanding.   Per  chart review script for Sprinolactone 25 mg twice daily has been received by pharmacy, E-Prescribing Status: Receipt confirmed by pharmacy (12/20/2020  9:54 AM EDT)

## 2020-12-24 ENCOUNTER — Encounter: Payer: Self-pay | Admitting: Family Medicine

## 2021-01-06 ENCOUNTER — Other Ambulatory Visit: Payer: Self-pay | Admitting: Family Medicine

## 2021-01-06 DIAGNOSIS — Z1231 Encounter for screening mammogram for malignant neoplasm of breast: Secondary | ICD-10-CM

## 2021-01-10 ENCOUNTER — Other Ambulatory Visit: Payer: Self-pay

## 2021-01-10 ENCOUNTER — Encounter: Payer: Self-pay | Admitting: Family Medicine

## 2021-01-10 ENCOUNTER — Ambulatory Visit (INDEPENDENT_AMBULATORY_CARE_PROVIDER_SITE_OTHER): Payer: PPO | Admitting: Family Medicine

## 2021-01-10 VITALS — BP 160/80 | HR 60 | Temp 98.0°F | Ht 62.0 in | Wt 158.7 lb

## 2021-01-10 DIAGNOSIS — I10 Essential (primary) hypertension: Secondary | ICD-10-CM

## 2021-01-10 MED ORDER — NEBIVOLOL HCL 2.5 MG PO TABS: 2.5000 mg | ORAL_TABLET | Freq: Every day | ORAL | 3 refills | Status: DC

## 2021-01-10 MED ORDER — LOSARTAN POTASSIUM 25 MG PO TABS: 50.0000 mg | ORAL_TABLET | Freq: Every day | ORAL | 1 refills | Status: DC

## 2021-01-10 NOTE — Patient Instructions (Addendum)
*  we are going to decrease the bystolic to 2.'5mg'$  daily. I am hoping that this will help keep heart rate a little higher and help you not feel as run down.   *for now, we are going to keep the losartan at '50mg'$  daily. If your pressures are regularly over Q000111Q systolic then we can increase to '75mg'$ .   *let me know if you are not feeling better with the dose adjustment of bystolic. You should feel better within a couple of weeks.

## 2021-01-10 NOTE — Progress Notes (Signed)
Robin [name] DOB: 08/09/1933 Encounter date: 01/10/2021  This is a 87 y.o. female who presents with Chief Complaint  Patient presents with   Follow-up    History of present illness: Feels better in a lot of ways - legs are not cramping like they were. Itching in head went away with stopping the labetalol. But she hasn't felt well this whole year. All feels like it is coming from her blood pressure. Bp has been in 140-150/70-80 but heart rate in the 48-59 average.   She wasn't able to break the bystolic in half due to shape. Bystolic is doing a number on stomach - just a little upset. Not diarrhea. Taking with food.    Allergies  Allergen Reactions   Prilosec [Omeprazole] Cough   Amlodipine     Feet and ankle swelling   Protonix [Pantoprazole Sodium] Swelling   Amoxicillin Rash   Metronidazole Rash   Current Meds  Medication Sig   acetaminophen (TYLENOL) 500 MG tablet Take 500-1,000 mg by mouth every 6 (six) hours as needed for mild pain or headache.    cetirizine (ZYRTEC) 10 MG tablet Take 10 mg by mouth daily.   Cholecalciferol 25 MCG (1000 UT) TBDP Take 1,000 Units by mouth daily.   esomeprazole (NEXIUM) 40 MG capsule Take 1 capsule (40 mg total) by mouth 2 (two) times daily before a meal.   ezetimibe (ZETIA) 10 MG tablet TAKE 1 BY MOUTH DAILY   furosemide (LASIX) 20 MG tablet 1 tablet by mouth every other day as needed for swelling   losartan (COZAAR) 100 MG tablet Take 0.5 tablets (50 mg total) by mouth at bedtime.   nebivolol (BYSTOLIC) 5 MG tablet Take 1 tablet (5 mg total) by mouth daily.   spironolactone (ALDACTONE) 25 MG tablet Take 1 tablet (25 mg total) by mouth 2 (two) times daily.    Review of Systems  Constitutional:  Positive for fatigue. Negative for chills and fever.  Respiratory:  Negative for cough, chest tightness, shortness of breath and wheezing.   Cardiovascular:  Negative for chest pain, palpitations and leg swelling.   Objective:  Pulse (!) 114    Temp 98 F (36.7 C) (Oral)   Ht '5\' 2"'$  (1.575 m)   Wt 158 lb 11.2 oz (72 kg)   SpO2 97%   BMI 29.03 kg/m   Weight: 158 lb 11.2 oz (72 kg)   BP Readings from Last 3 Encounters:  12/03/20 (!) 150/78  10/18/20 (!) 146/62  10/04/20 (!) 152/70   Wt Readings from Last 3 Encounters:  01/10/21 158 lb 11.2 oz (72 kg)  12/03/20 159 lb 8 oz (72.3 kg)  10/18/20 157 lb 12.8 oz (71.6 kg)    Physical Exam Constitutional:      General: She is not in acute distress.    Appearance: She is well-developed.  Cardiovascular:     Rate and Rhythm: Normal rate and regular rhythm.     Heart sounds: Normal heart sounds. No murmur heard.   No friction rub.  Pulmonary:     Effort: Pulmonary effort is normal. No respiratory distress.     Breath sounds: Normal breath sounds. No wheezing or rales.  Musculoskeletal:     Right lower leg: No edema.     Left lower leg: No edema.  Neurological:     Mental Status: She is alert and oriented to person, place, and time.  Psychiatric:        Behavior: Behavior normal.    Assessment/Plan  1. Hypertension, essential, benign She feels better off labetalol. She is not getting intense drops in pressures, which has been good. I suspect feeling of fatigue is coming from lower heart rate. We will decrease the bystolic to 2.'5mg'$  and continue other meds at current doses. Did give her liberty to increase the losartan to '75mg'$  daily if pressures are regularly running over [id number] systolic at home. Keep me posted on how she is feeling (she has been updating weekly)  - nebivolol (BYSTOLIC) 2.5 MG tablet; Take 1 tablet (2.5 mg total) by mouth daily.  Dispense: 90 tablet; Refill: 3 - losartan (COZAAR) 25 MG tablet; Take 2 tablets (50 mg total) by mouth daily.  Dispense: 180 tablet; Refill: 1 - AMB Referral to [facility]   Return in about 2 months (around 03/12/2021) for Chronic condition visit.  32 minutes spent in chart review, time with patient, review of  medication plan/changes, exam, charting.   Micheline Rough, MD

## 2021-01-18 ENCOUNTER — Telehealth: Payer: Self-pay | Admitting: Pharmacist

## 2021-01-18 NOTE — Progress Notes (Signed)
Chronic Care Management Pharmacy Assistant   Name: Robin [name]  MRN: [id number] DOB: Jul 26, 1933   Reason for Encounter: Chart review for initial visit with Jeni Salles on 01-24-2021 at 9:30 in office   Conditions to be addressed/monitored: HTN & GERD, Asthma, Dyslipidemia and Osteoporosis  Recent office visits:  01-10-2021 [name], MD - Patient presented for Hypertension, essential, benign. Decreased Nebivolol HCL to 2.5 mg oral daily.  12-03-2020 [name], MD - Patient presented for Hypertension, essential, benign and other concerns. Stopped Rosuvastatin 5 mg.  11-24-2020 [name], LPN - Patient presented for Medicare Wellness Exam. No medication changes.  10-04-2020 [name], MD - Patient presented for Hypertension, essential, benign and other concerns. No medication changes.  Recent consult visits:  10-18-2020 [name], MD (Cardiology) - Patient presented for Essential hypertension and other concerns. No medication changes.  07-29-2020 [name], NP (GYN) - Patient presented for Lichen sclerosus. No medication changes.  07-19-2020 [name], MD (Cardiology) - Patient presented for Aortic stenosis and other concerns. Changed Furosemide 20 mg to PRN for swelling and Decreased Spironolactone to 25 mg oral two times daily.  Hospital visits:  None in previous 6 months  Medications: Outpatient Encounter Medications as of 01/18/2021  Medication Sig   acetaminophen (TYLENOL) 500 MG tablet Take 500-1,000 mg by mouth every 6 (six) hours as needed for mild pain or headache.    cetirizine (ZYRTEC) 10 MG tablet Take 10 mg by mouth daily.   Cholecalciferol 25 MCG (1000 UT) TBDP Take 1,000 Units by mouth daily.   esomeprazole (NEXIUM) 40 MG capsule Take 1 capsule (40 mg total) by mouth 2 (two) times daily before a meal.   ezetimibe (ZETIA) 10 MG tablet TAKE 1 BY MOUTH DAILY   furosemide (LASIX) 20 MG tablet 1 tablet by mouth  every other day as needed for swelling   losartan (COZAAR) 25 MG tablet Take 2 tablets (50 mg total) by mouth daily.   nebivolol (BYSTOLIC) 2.5 MG tablet Take 1 tablet (2.5 mg total) by mouth daily.   spironolactone (ALDACTONE) 25 MG tablet Take 1 tablet (25 mg total) by mouth 2 (two) times daily.   No facility-administered encounter medications on file as of 01/18/2021.   Fill History  :  ESOMEPRAZOLE MAGNESIUM '40MG'$  CAPSULE DELAYED RELEASE 01/03/2021 90   EZETIMIBE '10MG'$  TABLET 11/19/2020 90   FUROSEMIDE '20MG'$  TABLET 06/09/2020 90   LOSARTAN POTASSIUM '25MG'$  TABLET 01/11/2021 90   NEBIVOLOL HYDROCHLORIDE 2.'5MG'$  TABLET 01/10/2021 90   SPIRONOLACTONE '25MG'$  TABLET 12/22/2020 90   Have you seen any other providers since your last visit? **Patient reports no  Any changes in your medications or health? Patient reported Dose changes as above with Nebivolol and Spironolactone, medication list is accurate. She also noted that she is taking Nexuim twice daily. She reports she does not have asthma and does not use any inhalers.  Any side effects from any medications? Patient reported none that she can tell.  Do you have an symptoms or problems not managed by your medications? Patient reports none.  Any concerns about your health right now? Patient is concerned about her blood pressures.  Has your provider asked that you check blood pressure, blood sugar, or follow special diet at home? Patient reports that she is checking her pressures on a daily basis. She reports she does not do so well with watching her sodium intake with her meals or drinking enough water as she does not like  it and she does not eat many vegetables. She reports she eats different things for her meals this morning she would be having coffee and a pancake for breakfast.   Do you get any type of exercise on a regular basis? Patient reports she does get around in the home well with no devices to assist mobility, she does do a small  amount of housework.  Can you think of a goal you would like to reach for your health? Patient will think about prior to appointment.  Do you have any problems getting your medications? Patient reports she has no issues with her pharmacy or the cost of her medications.  Is there anything that you would like to discuss during the appointment? Patient reports no.  Patient made aware to  bring medications and supplements to appointment as well as a log of her blood pressure readings. Patient aware she will receive a reminder call the day prior. She was in agreement.   Care Gaps: AWV - Done on 11-24-2020 Zoster Vaccines - Overdue COVID Booster #4 [employer]) - Overdue Flu Vaccine - Overdue  Star Rating Drugs: Losartan (Cozaar) 25 mg - Last filled 01-11-2021 90 DS at [facility] Pharmacist Assistant [phone]

## 2021-01-24 ENCOUNTER — Ambulatory Visit (INDEPENDENT_AMBULATORY_CARE_PROVIDER_SITE_OTHER): Payer: PPO | Admitting: Pharmacist

## 2021-01-24 ENCOUNTER — Other Ambulatory Visit: Payer: Self-pay

## 2021-01-24 DIAGNOSIS — I10 Essential (primary) hypertension: Secondary | ICD-10-CM

## 2021-01-24 DIAGNOSIS — E785 Hyperlipidemia, unspecified: Secondary | ICD-10-CM

## 2021-01-24 NOTE — Patient Instructions (Addendum)
Hi Robin [name],  It was great to get to meet you in person! Below is a summary of some of the topics we discussed.   As we discussed on the phone, go ahead and stop taking the Bystolic and losartan and start taking the telmisartan in the evening.  Please reach out to me if you have any questions or need anything before our follow up!  Best, Maddie  Jeni Salles, PharmD, [facility]   Visit Information   Goals Addressed   None    Patient Care Plan: CCM Pharmacy Care Plan     Problem Identified: Problem: Hypertension, Hyperlipidemia, GERD, Asthma, and Osteoporosis      Long-Range Goal: Patient-Specific Goal   Start Date: 01/24/2021  Expected End Date: 01/24/2021  This Visit's Progress: On track  Priority: High  Note:   Current Barriers:  Unable to achieve control of blood pressure   Pharmacist Clinical Goal(s):  Patient will achieve control of blood pressure as evidenced by home and office readings  through collaboration with PharmD and provider.   Interventions: 1:1 collaboration with [name], MD regarding development and update of comprehensive plan of care as evidenced by provider attestation and co-signature Inter-disciplinary care team collaboration (see longitudinal plan of care) Comprehensive medication review performed; medication list updated in electronic medical record  Hypertension (BP goal <140/90) -Uncontrolled -Current treatment: Spironolactone 25 mg 1 tablet twice daily  Losartan 25 mg 2 tablets daily - in PM (evening meal) Furosemide 20 mg 1 tablet as needed Nebivolol 2.5 mg 1/2 tablet daily - in AM -Medications previously tried: chlorthalidone, clonidine, atenolol, hydralazine, amlodipine (swelling), lisinopril, candesartan, azilsartan, olmesartan (change to combo), labetalol (cramping) -Current home readings: 158/86, 130/66, 147/72, 134/66, 153/70, 121/65, 148/70 (HR 50s-low  60s) -Current dietary habits: does not limit salt intake or read package labels -Current exercise habits: does not routinely exercise -Reports hypotensive/hypertensive symptoms -Educated on Daily salt intake goal < 2300 mg; Exercise goal of 150 minutes per week; Importance of home blood pressure monitoring; Proper BP monitoring technique; Symptoms of hypotension and importance of maintaining adequate hydration; -Counseled to monitor BP at home daily, document, and provide log at future appointments -Counseled on diet and exercise extensively Recommended switching losartan to telmisartan and stopping Nebivolol.  Fluid retention (Goal: minimize swelling) -Controlled -Current treatment  Furosemide 20 mg 1 tablet as needed -Medications previously tried: none  -Recommended to continue current medication   Hyperlipidemia: (LDL goal < 100) -Controlled -Current treatment: Zetia 10 mg 1 tablet daily (holding) -Medications previously tried: rosuvastatin (cramping)  -Current dietary patterns: eats out some -Current exercise habits: does not routinely exercise -Educated on Cholesterol goals;  Benefits of statin for ASCVD risk reduction; Exercise goal of 150 minutes per week; -Recommended resuming Zetia or rosuvastatin depending on lipid panel results.  Osteopenia (Goal prevent fractures) -Uncontrolled -Last DEXA Scan: 03/18/2019   T-Score femoral neck: -2.6  T-Score total hip: n/a  T-Score lumbar spine: -1.0  T-Score forearm radius: -2.7  10-year probability of major osteoporotic fracture: n/a  10-year probability of hip fracture: n/a -Patient is a candidate for pharmacologic treatment due to T-Score < -2.5 in femoral neck -Current treatment  Vitamin D 1000 units daily -Medications previously tried: none  -Recommend [phone] units of vitamin D daily. Recommend weight-bearing and muscle strengthening exercises for building and maintaining bone density. -Counseled on diet and exercise  extensively Reassess at follow up.  GERD (Goal: minimize symptoms) -Controlled -Current treatment  Esomeprazole 40 mg 1  capsule twice daily before meals -Medications previously tried: none  -Counseled on non-pharmacologic management of symptoms such as elevating the head of your bed, avoiding eating 2-3 hours before bed, avoiding triggering foods such as acidic, spicy, or fatty foods, eating smaller meals, and wearing clothes that are loose around the waist Patient reports her triggers include tomatoes and spicy foods.  Health Maintenance -Vaccine gaps: shingrix, influenza -Current therapy:  Cetirizine 10 mg 1 tablet daily (took when dizziness started) - vertigo Vitamin B12 - 1000 mcg - sublingual Acetaminophen 500 mg as needed -Educated on Cost vs benefit of each product must be carefully weighed by individual consumer -Patient is satisfied with current therapy and denies issues -Recommended to continue current medication  Patient Goals/Self-Care Activities Patient will:  - take medications as prescribed check blood pressure daily, document, and provide at future appointments target a minimum of 150 minutes of moderate intensity exercise weekly engage in dietary modifications by limiting salt intake  Follow Up Plan: Face to Face appointment with care management team member scheduled for:  1 month      Robin [name] was given information about Chronic Care Management services today including:  CCM service includes personalized support from designated clinical staff supervised by her physician, including individualized plan of care and coordination with other care providers 24/7 contact phone numbers for assistance for urgent and routine care needs. Standard insurance, coinsurance, copays and deductibles apply for chronic care management only during months in which we provide at least 20 minutes of these services. Most insurances cover these services at 100%, however patients may be  responsible for any copay, coinsurance and/or deductible if applicable. This service may help you avoid the need for more expensive face-to-face services. Only one practitioner may furnish and bill the service in a calendar month. The patient may stop CCM services at any time (effective at the end of the month) by phone call to the office staff.  Patient agreed to services and verbal consent obtained.   The patient verbalized understanding of instructions, educational materials, and care plan provided today and agreed to receive a mailed copy of patient instructions, educational materials, and care plan.  Face to Face appointment with pharmacist scheduled for: 1 month  Robin [name], [facility]

## 2021-01-24 NOTE — Progress Notes (Signed)
Chronic Care Management Pharmacy Note  02/02/2021 Name:  Robin [name] MRN:  [id number] DOB:  1933-07-03  Summary: BP not at goal < 140/90  Recommendations/Changes made from today's visit: -Recommended stopping Bystolic  -Recommended switching losartan 50 mg to telmisartan 40 mg with a plan to titrate to 80 mg  Plan: Follow up in one month   Subjective: Robin [name] is an 87 y.o. year old female who is a primary patient of Robin [name], Robin Berg, MD.  The CCM team was consulted for assistance with disease management and care coordination needs.    Engaged with patient face to face for initial visit in response to provider referral for pharmacy case management and/or care coordination services.   Consent to Services:  The patient was given the following information about Chronic Care Management services today, agreed to services, and gave verbal consent: 1. CCM service includes personalized support from designated clinical staff supervised by the primary care provider, including individualized plan of care and coordination with other care providers 2. 24/7 contact phone numbers for assistance for urgent and routine care needs. 3. Service will only be billed when office clinical staff spend 20 minutes or more in a month to coordinate care. 4. Only one practitioner may furnish and bill the service in a calendar month. 5.The patient may stop CCM services at any time (effective at the end of the month) by phone call to the office staff. 6. The patient will be responsible for cost sharing (co-pay) of up to 20% of the service fee (after annual deductible is met). Patient agreed to services and consent obtained.  Patient Care Team: [name], MD as PCP - General (Family Medicine) [name] [name], MD as PCP - Cardiology (Cardiology) [name], MD as Consulting Physician (Gastroenterology) [name] [name], MD as Consulting Physician (Cardiology) [name], MD as  Consulting Physician (Ophthalmology) [name], MD as Consulting Physician (Gynecology) [name], [facility] as Pharmacist (Pharmacist)  Recent office visits: 01-10-2021 [name], MD - Patient presented for Hypertension, essential, benign. Decreased Nebivolol HCL to 2.5 mg oral daily.   12-03-2020 [name], MD - Patient presented for Hypertension, essential, benign and other concerns. Stopped Rosuvastatin 5 mg.   11-24-2020 [name], LPN - Patient presented for Medicare Wellness Exam. No medication changes.   10-04-2020 [name], MD - Patient presented for Hypertension, essential, benign and other concerns. No medication changes.  Recent consult visits: 10-18-2020 [name], MD (Cardiology) - Patient presented for Essential hypertension and other concerns. No medication changes.   07-29-2020 [name], NP (GYN) - Patient presented for Lichen sclerosus. No medication changes.   07-19-2020 [name], MD (Cardiology) - Patient presented for Aortic stenosis and other concerns. Changed Furosemide 20 mg to PRN for swelling and Decreased Spironolactone to 25 mg oral two times daily.  Hospital visits: None in previous 6 months   Objective:  Lab Results  Component Value Date   CREATININE 0.83 10/04/2020   BUN 16 10/04/2020   GFR 64.58 10/04/2020   GFRNONAA 64 03/16/2020   GFRAA 74 03/16/2020   NA 141 10/04/2020   K 4.5 10/04/2020   CALCIUM 10.6 (H) 10/04/2020   CO2 30 10/04/2020   GLUCOSE 83 10/04/2020    Lab Results  Component Value Date/Time   HGBA1C 5.5 09/30/2013 09:28 AM   HGBA1C 5.4 06/19/2012 09:19 AM   GFR 64.58 10/04/2020 10:31 AM   GFR 67.61 07/05/2020 12:33  PM    Last diabetic Eye exam: No results found for: HMDIABEYEEXA  Last diabetic Foot exam: No results found for: HMDIABFOOTEX   Lab Results  Component Value Date   CHOL 154 10/04/2020   HDL 60.40 10/04/2020   LDLCALC 78 10/04/2020   TRIG 75.0  10/04/2020   CHOLHDL 3 10/04/2020    Hepatic Function Latest Ref Rng & Units 10/04/2020 07/05/2020 02/02/2020  Total Protein 6.0 - 8.3 g/dL 6.4 6.4 6.6  Albumin 3.5 - 5.2 g/dL 4.2 4.2 -  AST 0 - 37 U/L $Remo'16 17 17  'aKVYL$ ALT 0 - 35 U/L $Remo'12 11 12  'kBqWe$ Alk Phosphatase 39 - 117 U/L 73 75 -  Total Bilirubin 0.2 - 1.2 mg/dL 0.6 0.7 0.6  Bilirubin, Direct 0.0 - 0.3 mg/dL - - -    Lab Results  Component Value Date/Time   TSH 1.70 07/05/2020 12:33 PM   TSH 1.59 02/02/2020 10:00 AM    CBC Latest Ref Rng & Units 10/04/2020 07/05/2020 02/02/2020  WBC 4.0 - 10.5 K/uL 6.0 5.9 6.5  Hemoglobin 12.0 - 15.0 g/dL 11.9(L) 12.2 12.7  Hematocrit 36.0 - 46.0 % 35.1(L) 36.7 38.7  Platelets 150.0 - 400.0 K/uL 259.0 269.0 322    Lab Results  Component Value Date/Time   VD25OH 44.89 07/05/2020 12:33 PM   VD25OH 52.47 09/15/2019 01:03 PM    Clinical ASCVD: No  The ASCVD Risk score (Arnett DK, et al., 2019) failed to calculate for the following reasons:   The 2019 ASCVD risk score is only valid for ages 65 to 19    Depression screen PHQ 2/9 11/24/2020 11/24/2019 10/22/2018  Decreased Interest 0 0 0  Down, Depressed, Hopeless 0 0 0  PHQ - 2 Score 0 0 0  Altered sleeping - 0 -  Tired, decreased energy - 0 -  Change in appetite - 0 -  Feeling bad or failure about yourself  - 0 -  Trouble concentrating - 0 -  Moving slowly or fidgety/restless - 0 -  Suicidal thoughts - 0 -  PHQ-9 Score - 0 -  Difficult doing work/chores - Not difficult at all -  Some recent data might be hidden     Social History   Tobacco Use  Smoking Status Never  Smokeless Tobacco Never   BP Readings from Last 3 Encounters:  01/10/21 (!) 160/80  12/03/20 (!) 150/78  10/18/20 (!) 146/62   Pulse Readings from Last 3 Encounters:  01/10/21 60  12/03/20 61  10/18/20 60   Wt Readings from Last 3 Encounters:  01/10/21 158 lb 11.2 oz (72 kg)  12/03/20 159 lb 8 oz (72.3 kg)  10/18/20 157 lb 12.8 oz (71.6 kg)   BMI Readings from Last 3  Encounters:  01/10/21 29.03 kg/m  12/03/20 29.17 kg/m  10/18/20 28.86 kg/m    Assessment/Interventions: Review of patient past medical history, allergies, medications, health status, including review of consultants reports, laboratory and other test data, was performed as part of comprehensive evaluation and provision of chronic care management services.   SDOH:  (Social Determinants of Health) assessments and interventions performed: Yes SDOH Interventions    Flowsheet Row Most Recent Value  SDOH Interventions   Financial Strain Interventions Intervention Not Indicated  Transportation Interventions Intervention Not Indicated      SDOH Screenings   Alcohol Screen: Not on file  Depression (PHQ2-9): Low Risk    PHQ-2 Score: 0  Financial Resource Strain: Low Risk    Difficulty of Paying Living Expenses: Not  hard at all  Food Insecurity: No Food Insecurity   Worried About Charity fundraiser in the Last Year: Never true   Ran Out of Food in the Last Year: Never true  Housing: Low Risk    Last Housing Risk Score: 0  Physical Activity: Inactive   Days of Exercise per Week: 0 days   Minutes of Exercise per Session: 0 min  Social Connections: Socially Isolated   Frequency of Communication with Friends and Family: More than three times a week   Frequency of Social Gatherings with Friends and Family: Once a week   Attends Religious Services: Never   Marine scientist or Organizations: No   Attends Archivist Meetings: Never   Marital Status: Widowed  Stress: No Stress Concern Present   Feeling of Stress : Not at all  Tobacco Use: Low Risk    Smoking Tobacco Use: Never   Smokeless Tobacco Use: Never  Transportation Needs: No Transportation Needs   Lack of Transportation (Medical): No   Lack of Transportation (Non-Medical): No   Patient eats a lot of processed foods, specifically frozen meals and usually buys lean cuisine. She does also buy some canned  vegetables and frozen vegetables. She usually eats cereal or pancakes for breakfast. She used to eat out more and doesn't eat out as often. She goes to fast food places such as [employer] and usually orders chicken nuggets.  Patient is not doing any exercise. She doesn't walk very well, because she is not that strong in her back. She doesn't have anywhere to walk except [employer] but she does not attend there and there are no parks nearby. She sometimes takes a nap during the day but is not usually sitting and watching TV. She has help every other week for cleaning.   Patient's biggest concern today is her blood pressure and she has been unable to tolerate a lot of medications to treat her blood pressure.  CCM Care Plan  Allergies  Allergen Reactions   Prilosec [Omeprazole] Cough   Amlodipine     Feet and ankle swelling   Protonix [Pantoprazole Sodium] Swelling   Amoxicillin Rash   Metronidazole Rash    Medications Reviewed Today     Reviewed by [name], [facility] (Pharmacist) on 01/24/21 at 1029  Med List Status: <None>   Medication Order Taking? Sig Documenting Provider Last Dose Status Informant  acetaminophen (TYLENOL) 500 MG tablet [id number] Yes Take 500-1,000 mg by mouth every 6 (six) hours as needed for mild pain or headache.  [provider] Taking Active Self  cetirizine (ZYRTEC) 10 MG tablet [id number] Yes Take 10 mg by mouth daily. [provider] Taking Active Self  Cholecalciferol 25 MCG (1000 UT) TBDP [id number] Yes Take 1,000 Units by mouth daily. Renato Shin, MD Taking Active Self  esomeprazole (NEXIUM) 40 MG capsule [id number] Yes Take 1 capsule (40 mg total) by mouth 2 (two) times daily before a meal. Levin Erp, PA Taking Active   ezetimibe (ZETIA) 10 MG tablet [id number] No TAKE 1 BY MOUTH DAILY  Patient not taking: Reported on 01/24/2021   [name], MD Not Taking Active   furosemide (LASIX) 20 MG  tablet [id number] Yes 1 tablet by mouth every other day as needed for swelling [name], MD Taking Active   losartan (COZAAR) 25 MG tablet [id number] Yes Take 2 tablets (50 mg total) by mouth daily. [name], MD Taking Active  nebivolol (BYSTOLIC) 2.5 MG tablet [id number] Yes Take 1 tablet (2.5 mg total) by mouth daily. [name], MD Taking Active   spironolactone (ALDACTONE) 25 MG tablet [id number] Yes Take 1 tablet (25 mg total) by mouth 2 (two) times daily. [name], MD Taking Active             Patient Active Problem List   Diagnosis Date Noted   Osteoporosis of femur without pathological fracture 06/24/2019   Dyspnea 02/03/2019   Asthma 02/03/2019   Vertigo 06/26/2017   Hyperparathyroidism ([place]) 08/24/2014   Slow transit constipation 01/13/2014   IBS (irritable bowel syndrome), GERD, hx colon polyps, chronic abd pain - followed by Dr. Sharlett Iles in GI 06/11/2012   Osteopenia 08/29/2011   Vitamin D deficiency 08/29/2011   GERD (gastroesophageal reflux disease) 11/23/2010   Dyslipidemia 09/09/2007   Hypertension, essential, benign 09/09/2007    Immunization History  Administered Date(s) Administered   Fluad Quad(high Dose 65+) 03/04/2019, 02/02/2020   Influenza, High Dose Seasonal PF 03/23/2015, 02/22/2016, 02/13/2017, 03/11/2018   Influenza,inj,Quad PF,6+ Mos 03/07/2013, 03/03/2014   PFIZER(Purple Top)SARS-COV-2 Vaccination 06/21/2019, 07/12/2019, 03/30/2020, 12/03/2020   Pneumococcal Conjugate-13 08/10/2014   Pneumococcal Polysaccharide-23 07/23/2012   Tdap 07/23/2012   Zoster, Live 09/30/2013    Conditions to be addressed/monitored:  Hypertension, Hyperlipidemia, GERD, Asthma, and Osteoporosis  Care Plan : [place]  Updates made by [name], [place] since 02/02/2021 12:00 AM     Problem: Problem: Hypertension, Hyperlipidemia, GERD, Asthma, and Osteoporosis      Long-Range Goal: Patient-Specific Goal   Start  Date: 01/24/2021  Expected End Date: 01/24/2021  This Visit's Progress: On track  Priority: High  Note:   Current Barriers:  Unable to achieve control of blood pressure   Pharmacist Clinical Goal(s):  Patient will achieve control of blood pressure as evidenced by home and office readings  through collaboration with PharmD and provider.   Interventions: 1:1 collaboration with [name], MD regarding development and update of comprehensive plan of care as evidenced by provider attestation and co-signature Inter-disciplinary care team collaboration (see longitudinal plan of care) Comprehensive medication review performed; medication list updated in electronic medical record  Hypertension (BP goal <140/90) -Uncontrolled -Current treatment: Spironolactone 25 mg 1 tablet twice daily  Losartan 25 mg 2 tablets daily - in PM (evening meal) Furosemide 20 mg 1 tablet as needed Nebivolol 2.5 mg 1/2 tablet daily - in AM -Medications previously tried: chlorthalidone, clonidine, atenolol, hydralazine, amlodipine (swelling), lisinopril, candesartan, azilsartan, olmesartan (change to combo), labetalol (cramping) -Current home readings: 158/86, 130/66, 147/72, 134/66, 153/70, 121/65, 148/70 (HR 50s-low 60s) -Current dietary habits: does not limit salt intake or read package labels -Current exercise habits: does not routinely exercise -Reports hypotensive/hypertensive symptoms -Educated on Daily salt intake goal < 2300 mg; Exercise goal of 150 minutes per week; Importance of home blood pressure monitoring; Proper BP monitoring technique; Symptoms of hypotension and importance of maintaining adequate hydration; -Counseled to monitor BP at home daily, document, and provide log at future appointments -Counseled on diet and exercise extensively Recommended switching losartan to telmisartan and stopping Nebivolol.  Fluid retention (Goal: minimize swelling) -Controlled -Current treatment   Furosemide 20 mg 1 tablet as needed -Medications previously tried: none  -Recommended to continue current medication   Hyperlipidemia: (LDL goal < 100) -Controlled -Current treatment: Zetia 10 mg 1 tablet daily (holding) -Medications previously tried: rosuvastatin (cramping)  -Current dietary patterns: eats out some -Current exercise habits: does not routinely exercise -Educated on  Cholesterol goals;  Benefits of statin for ASCVD risk reduction; Exercise goal of 150 minutes per week; -Recommended resuming Zetia or rosuvastatin depending on lipid panel results.  Osteopenia (Goal prevent fractures) -Uncontrolled -Last DEXA Scan: 03/18/2019   T-Score femoral neck: -2.6  T-Score total hip: n/a  T-Score lumbar spine: -1.0  T-Score forearm radius: -2.7  10-year probability of major osteoporotic fracture: n/a  10-year probability of hip fracture: n/a -Patient is a candidate for pharmacologic treatment due to T-Score < -2.5 in femoral neck -Current treatment  Vitamin D 1000 units daily -Medications previously tried: none  -Recommend [phone] units of vitamin D daily. Recommend weight-bearing and muscle strengthening exercises for building and maintaining bone density. -Counseled on diet and exercise extensively Reassess at follow up.  GERD (Goal: minimize symptoms) -Controlled -Current treatment  Esomeprazole 40 mg 1 capsule twice daily before meals -Medications previously tried: none  -Counseled on non-pharmacologic management of symptoms such as elevating the head of your bed, avoiding eating 2-3 hours before bed, avoiding triggering foods such as acidic, spicy, or fatty foods, eating smaller meals, and wearing clothes that are loose around the waist Patient reports her triggers include tomatoes and spicy foods.  Health Maintenance -Vaccine gaps: shingrix, influenza -Current therapy:  Cetirizine 10 mg 1 tablet daily (took when dizziness started) - vertigo Vitamin B12 - 1000  mcg - sublingual Acetaminophen 500 mg as needed -Educated on Cost vs benefit of each product must be carefully weighed by individual consumer -Patient is satisfied with current therapy and denies issues -Recommended to continue current medication  Patient Goals/Self-Care Activities Patient will:  - take medications as prescribed check blood pressure daily, document, and provide at future appointments target a minimum of 150 minutes of moderate intensity exercise weekly engage in dietary modifications by limiting salt intake  Follow Up Plan: Face to Face appointment with care management team member scheduled for:  1 month       Medication Assistance: None required.  Patient affirms current coverage meets needs.  Compliance/Adherence/Medication fill history: Care Gaps: Shingrix, influenza  Star-Rating Drugs: Losartan (Cozaar) 25 mg - Last filled 01-11-2021 90 DS at [facility]  Patient's preferred pharmacy is:  [facility], [address] [place] [address] Phone: [phone] Fax: [phone]  [facility]) [place], [address] [place] [address] Phone: [phone] Fax: [phone]  [facility]) - [address], [address] [place] [address] Phone: [phone] Fax: [phone]  Uses pill box? Yes Pt endorses 100% compliance  We discussed: Current pharmacy is preferred with insurance plan and patient is satisfied with pharmacy services Patient decided to: Continue current medication management strategy  Care Plan and Follow Up Patient Decision:  Patient agrees to Care Plan and Follow-up.  Plan: Face to Face appointment with care management team member scheduled for: 1 month  Jeni Salles, PharmD, [place] Pharmacist [place] at [place] [phone]

## 2021-01-28 ENCOUNTER — Encounter: Payer: Self-pay | Admitting: Family Medicine

## 2021-02-02 ENCOUNTER — Telehealth: Payer: Self-pay | Admitting: Pharmacist

## 2021-02-02 ENCOUNTER — Other Ambulatory Visit: Payer: Self-pay | Admitting: Family Medicine

## 2021-02-02 MED ORDER — TELMISARTAN 40 MG PO TABS: 40.0000 mg | ORAL_TABLET | Freq: Every day | ORAL | 1 refills | Status: DC

## 2021-02-02 NOTE — Telephone Encounter (Signed)
-----   Message from [name], MD sent at 02/02/2021  1:08 PM EDT ----- Regarding: RE: BP medication Med has been sent ----- Message ----- From: [name], [facility] Sent: 02/02/2021   7:59 AM EDT To: [name], MD Subject: RE: BP medication                              Ok, can you please send it into her local pharmacy and I will call her this morning? I am going to follow up with her in person next month (she sees you in November) so if we need to increase to the 80 mg at that time we can. ----- Message ----- From: [name], MD Sent: 02/02/2021   7:57 AM EDT To: [name], RPH Subject: RE: BP medication                              She has such a hard time with meds I would do the 40mg  first of telmisartan and increase if needed (which will probably be needed because before bystolic she was regularly up in the 160-170's.  ----- Message ----- From: [name], [facility] Sent: 02/02/2021   7:54 AM EDT To: [name], MD Subject: BP medication                                  I can't figure out a way to respond to the mychart encounter without creating a telephone note but I just submitted my note to you. I was thinking we could stop the Bystolic (she is still having headaches and her pulse was in the 50s-60s range and switch her losartan to telmisartan 40 mg with maybe a quick titration to 80 mg? Or would you rather just switch her to 80 mg? The 40 mg dose is equivalent to what she is taking for losartan 50 mg right now.  Let me know and I will call her today!  Thanks, Maddie

## 2021-02-02 NOTE — Telephone Encounter (Signed)
Called patient to make her aware of the changes with her medications. Patient is aware to stop taking the Bystolic and losartan and to take telmisartan in the evening. She verbalized her understanding.  Scheduled follow up in office with pharmacist for October for possible further titration with telmisartan.

## 2021-02-11 DIAGNOSIS — I10 Essential (primary) hypertension: Secondary | ICD-10-CM | POA: Diagnosis not present

## 2021-02-11 DIAGNOSIS — E785 Hyperlipidemia, unspecified: Secondary | ICD-10-CM | POA: Diagnosis not present

## 2021-02-21 ENCOUNTER — Telehealth: Payer: Self-pay | Admitting: Pharmacist

## 2021-02-21 ENCOUNTER — Other Ambulatory Visit: Payer: Self-pay

## 2021-02-21 ENCOUNTER — Ambulatory Visit
Admission: RE | Admit: 2021-02-21 | Discharge: 2021-02-21 | Disposition: A | Discharge status: Home / Self Care | Payer: PPO | Source: Ambulatory Visit | Attending: Family Medicine | Admitting: Family Medicine

## 2021-02-21 DIAGNOSIS — Z1231 Encounter for screening mammogram for malignant neoplasm of breast: Secondary | ICD-10-CM

## 2021-02-21 NOTE — Chronic Care Management (AMB) (Signed)
    Chronic Care Management Pharmacy Assistant   Name: EPPIE BARHORST  MRN: [id number] DOB: 25-Nov-1933   02-21-2021 APPOINTMENT REMINDER   Called  [facility], No answer, left message of appointment on 02-22-2021 at 10:30 via office visit with Jeni Salles, Pharm D. Notified to bring blood pressure cuff, all medications, supplements, blood pressure and/or blood sugar logs to appointment and to return call if need to reschedule.  Questions: Have you had any recent office visit or specialist visit outside of [place]? According to chart notes no   Care Gaps: AWV - Done on 11-24-2020 Zoster Vaccines - Overdue COVID Booster #4 [employer]) - Overdue Flu Vaccine - Overdue BP 1608/80 ([id number])  Star Rating Drug: Telmisartan (Micardis) 40 mg - Last filled 02-03-2021 30 DS at [facility]  Any gaps in medications fill history? None  Medications: Outpatient Encounter Medications as of 02/21/2021  Medication Sig   acetaminophen (TYLENOL) 500 MG tablet Take 500-1,000 mg by mouth every 6 (six) hours as needed for mild pain or headache.    cetirizine (ZYRTEC) 10 MG tablet Take 10 mg by mouth daily.   Cholecalciferol 25 MCG (1000 UT) TBDP Take 1,000 Units by mouth daily.   esomeprazole (NEXIUM) 40 MG capsule Take 1 capsule (40 mg total) by mouth 2 (two) times daily before a meal.   ezetimibe (ZETIA) 10 MG tablet TAKE 1 BY MOUTH DAILY (Patient not taking: Reported on 01/24/2021)   furosemide (LASIX) 20 MG tablet 1 tablet by mouth every other day as needed for swelling   spironolactone (ALDACTONE) 25 MG tablet Take 1 tablet (25 mg total) by mouth 2 (two) times daily.   telmisartan (MICARDIS) 40 MG tablet Take 1 tablet (40 mg total) by mouth daily.   No facility-administered encounter medications on file as of 02/21/2021.    [place] Clinical Pharmacist Assistant [phone]

## 2021-02-22 ENCOUNTER — Ambulatory Visit (INDEPENDENT_AMBULATORY_CARE_PROVIDER_SITE_OTHER): Payer: PPO | Admitting: Pharmacist

## 2021-02-22 DIAGNOSIS — I10 Essential (primary) hypertension: Secondary | ICD-10-CM

## 2021-02-22 DIAGNOSIS — E7800 Pure hypercholesterolemia, unspecified: Secondary | ICD-10-CM

## 2021-02-22 NOTE — Progress Notes (Signed)
Chronic Care Management Pharmacy Note  02/23/2021 Name:  Robin [name] MRN:  [id number] DOB:  01-05-1934  Summary: BP and HR improved per home readings  Recommendations/Changes made from today's visit: -Recommended continued home BP monitoring -Recommend repeat lipid panel at next PCP visit and consider restarting Zetia -Recommended visit with gastroenterology  Plan: Follow up in after PCP visit next month  Subjective: Robin [name] is an 87 y.o. year old female who is a primary patient of [name], [name], MD.  The CCM team was consulted for assistance with disease management and care coordination needs.    Engaged with patient face to face for follow up visit in response to provider referral for pharmacy case management and/or care coordination services.   Consent to Services:  The patient was given information about Chronic Care Management services, agreed to services, and gave verbal consent prior to initiation of services.  Please see initial visit note for detailed documentation.   Patient Care Team: [name], MD as PCP - General (Family Medicine) [name] [name], MD as PCP - Cardiology (Cardiology) [name], MD as Consulting Physician (Gastroenterology) [name] [name], MD as Consulting Physician (Cardiology) [name], MD as Consulting Physician (Ophthalmology) [name], MD as Consulting Physician (Gynecology) [name], [facility] as Pharmacist (Pharmacist)  Recent office visits: 01-10-2021 [name], MD - Patient presented for Hypertension, essential, benign. Decreased Nebivolol HCL to 2.5 mg oral daily.   12-03-2020 [name], MD - Patient presented for Hypertension, essential, benign and other concerns. Stopped Rosuvastatin 5 mg.   11-24-2020 [name], LPN - Patient presented for Medicare Wellness Exam. No medication changes.   10-04-2020 [name], MD - Patient presented for  Hypertension, essential, benign and other concerns. No medication changes.  Recent consult visits: 10-18-2020 [name], MD (Cardiology) - Patient presented for Essential hypertension and other concerns. No medication changes.   07-29-2020 [name], NP (GYN) - Patient presented for Lichen sclerosus. No medication changes.   07-19-2020 [name], MD (Cardiology) - Patient presented for Aortic stenosis and other concerns. Changed Furosemide 20 mg to PRN for swelling and Decreased Spironolactone to 25 mg oral two times daily.  Hospital visits: None in previous 6 months   Objective:  Lab Results  Component Value Date   CREATININE 0.83 10/04/2020   BUN 16 10/04/2020   GFR 64.58 10/04/2020   GFRNONAA 64 03/16/2020   GFRAA 74 03/16/2020   NA 141 10/04/2020   K 4.5 10/04/2020   CALCIUM 10.6 (H) 10/04/2020   CO2 30 10/04/2020   GLUCOSE 83 10/04/2020    Lab Results  Component Value Date/Time   HGBA1C 5.5 09/30/2013 09:28 AM   HGBA1C 5.4 06/19/2012 09:19 AM   GFR 64.58 10/04/2020 10:31 AM   GFR 67.61 07/05/2020 12:33 PM    Last diabetic Eye exam: No results found for: HMDIABEYEEXA  Last diabetic Foot exam: No results found for: HMDIABFOOTEX   Lab Results  Component Value Date   CHOL 154 10/04/2020   HDL 60.40 10/04/2020   LDLCALC 78 10/04/2020   TRIG 75.0 10/04/2020   CHOLHDL 3 10/04/2020    Hepatic Function Latest Ref Rng & Units 10/04/2020 07/05/2020 02/02/2020  Total Protein 6.0 - 8.3 g/dL 6.4 6.4 6.6  Albumin 3.5 - 5.2 g/dL 4.2 4.2 -  AST 0 - 37 U/L $Remo'16 17 17  'ixgpO$ ALT 0 - 35 U/L $Remo'12 11 12  'knFcG$ Alk Phosphatase 39 - 117  U/L 73 75 -  Total Bilirubin 0.2 - 1.2 mg/dL 0.6 0.7 0.6  Bilirubin, Direct 0.0 - 0.3 mg/dL - - -    Lab Results  Component Value Date/Time   TSH 1.70 07/05/2020 12:33 PM   TSH 1.59 02/02/2020 10:00 AM    CBC Latest Ref Rng & Units 10/04/2020 07/05/2020 02/02/2020  WBC 4.0 - 10.5 K/uL 6.0 5.9 6.5  Hemoglobin 12.0 - 15.0 g/dL 11.9(L) 12.2 12.7   Hematocrit 36.0 - 46.0 % 35.1(L) 36.7 38.7  Platelets 150.0 - 400.0 K/uL 259.0 269.0 322    Lab Results  Component Value Date/Time   VD25OH 44.89 07/05/2020 12:33 PM   VD25OH 52.47 09/15/2019 01:03 PM    Clinical ASCVD: No  The ASCVD Risk score (Arnett DK, et al., 2019) failed to calculate for the following reasons:   The 2019 ASCVD risk score is only valid for ages 78 to 50    Depression screen PHQ 2/9 11/24/2020 11/24/2019 10/22/2018  Decreased Interest 0 0 0  Down, Depressed, Hopeless 0 0 0  PHQ - 2 Score 0 0 0  Altered sleeping - 0 -  Tired, decreased energy - 0 -  Change in appetite - 0 -  Feeling bad or failure about yourself  - 0 -  Trouble concentrating - 0 -  Moving slowly or fidgety/restless - 0 -  Suicidal thoughts - 0 -  PHQ-9 Score - 0 -  Difficult doing work/chores - Not difficult at all -  Some recent data might be hidden     Social History   Tobacco Use  Smoking Status Never  Smokeless Tobacco Never   BP Readings from Last 3 Encounters:  01/10/21 (!) 160/80  12/03/20 (!) 150/78  10/18/20 (!) 146/62   Pulse Readings from Last 3 Encounters:  01/10/21 60  12/03/20 61  10/18/20 60   Wt Readings from Last 3 Encounters:  01/10/21 158 lb 11.2 oz (72 kg)  12/03/20 159 lb 8 oz (72.3 kg)  10/18/20 157 lb 12.8 oz (71.6 kg)   BMI Readings from Last 3 Encounters:  01/10/21 29.03 kg/m  12/03/20 29.17 kg/m  10/18/20 28.86 kg/m    Assessment/Interventions: Review of patient past medical history, allergies, medications, health status, including review of consultants reports, laboratory and other test data, was performed as part of comprehensive evaluation and provision of chronic care management services.   SDOH:  (Social Determinants of Health) assessments and interventions performed: No   SDOH Screenings   Alcohol Screen: Not on file  Depression (PHQ2-9): Low Risk    PHQ-2 Score: 0  Financial Resource Strain: Low Risk    Difficulty of Paying  Living Expenses: Not hard at all  Food Insecurity: No Food Insecurity   Worried About Charity fundraiser in the Last Year: Never true   Ran Out of Food in the Last Year: Never true  Housing: Low Risk    Last Housing Risk Score: 0  Physical Activity: Inactive   Days of Exercise per Week: 0 days   Minutes of Exercise per Session: 0 min  Social Connections: Socially Isolated   Frequency of Communication with Friends and Family: More than three times a week   Frequency of Social Gatherings with Friends and Family: Once a week   Attends Religious Services: Never   Marine scientist or Organizations: No   Attends Archivist Meetings: Never   Marital Status: Widowed  Stress: No Stress Concern Present   Feeling of Stress :  Not at all  Tobacco Use: Low Risk    Smoking Tobacco Use: Never   Smokeless Tobacco Use: Never  Transportation Needs: No Transportation Needs   Lack of Transportation (Medical): No   Lack of Transportation (Non-Medical): No   CCM Care Plan  Allergies  Allergen Reactions   Prilosec [Omeprazole] Cough   Amlodipine     Feet and ankle swelling   Protonix [Pantoprazole Sodium] Swelling   Amoxicillin Rash   Metronidazole Rash    Medications Reviewed Today     Reviewed by [name], [facility] (Pharmacist) on 02/22/21 at 1053  Med List Status: <None>   Medication Order Taking? Sig Documenting Provider Last Dose Status Informant  acetaminophen (TYLENOL) 500 MG tablet [id number]  Take 500-1,000 mg by mouth every 6 (six) hours as needed for mild pain or headache.  [provider]  Active Self  cetirizine (ZYRTEC) 10 MG tablet [id number]  Take 10 mg by mouth daily. [provider]  Active Self  Cholecalciferol 25 MCG (1000 UT) TBDP [id number] Yes Take 1,000 Units by mouth daily. Renato Shin, MD Taking Active Self  esomeprazole (NEXIUM) 40 MG capsule [id number]  Take 1 capsule (40 mg total) by mouth 2 (two) times daily before a meal.  Levin Erp, PA  Active   ezetimibe (ZETIA) 10 MG tablet [id number]  TAKE 1 BY MOUTH DAILY  Patient not taking: Reported on 01/24/2021   [name], MD  Active   furosemide (LASIX) 20 MG tablet [id number]  1 tablet by mouth every other day as needed for swelling [name], MD  Active   spironolactone (ALDACTONE) 25 MG tablet [id number]  Take 1 tablet (25 mg total) by mouth 2 (two) times daily. [name], MD  Active   telmisartan (MICARDIS) 40 MG tablet [id number]  Take 1 tablet (40 mg total) by mouth daily. [name], MD  Active             Patient Active Problem List   Diagnosis Date Noted   Osteoporosis of femur without pathological fracture 06/24/2019   Dyspnea 02/03/2019   Asthma 02/03/2019   Vertigo 06/26/2017   Hyperparathyroidism ([place]) 08/24/2014   Slow transit constipation 01/13/2014   IBS (irritable bowel syndrome), GERD, hx colon polyps, chronic abd pain - followed by Dr. Sharlett Iles in GI 06/11/2012   Osteopenia 08/29/2011   Vitamin D deficiency 08/29/2011   GERD (gastroesophageal reflux disease) 11/23/2010   Dyslipidemia 09/09/2007   Hypertension, essential, benign 09/09/2007    Immunization History  Administered Date(s) Administered   Fluad Quad(high Dose 65+) 03/04/2019, 02/02/2020   Influenza, High Dose Seasonal PF 03/23/2015, 02/22/2016, 02/13/2017, 03/11/2018   Influenza,inj,Quad PF,6+ Mos 03/07/2013, 03/03/2014   PFIZER(Purple Top)SARS-COV-2 Vaccination 06/21/2019, 07/12/2019, 03/30/2020, 12/03/2020   Pneumococcal Conjugate-13 08/10/2014   Pneumococcal Polysaccharide-23 07/23/2012   Tdap 07/23/2012   Zoster, Live 09/30/2013   Patient reports she is feeling much better with the change in medications and she is happy with her blood pressure where it is. She does note a little bit of residual stomach upset but she is unsure if this is from telmisartan or there is an underlying issue. She does note a lot less dizziness with  the change in her BP medications. She brought her home BP cuff and it was off from what the manual cuff was reading. She got 184/82 HR 109, 172/90 HR 75 and then 190/90 with her cuff with a 160/80 with the manual.    She has been trying  to cook a little more and read package labels more than before.   Conditions to be addressed/monitored:  Hypertension, Hyperlipidemia, GERD, Asthma, and Osteoporosis  Conditions addressed this visit: Hypertension, Hyperlipidemia  Care Plan : [place]  Updates made by [name], [place] since 02/23/2021 12:00 AM     Problem: Problem: Hypertension, Hyperlipidemia, GERD, Asthma, and Osteoporosis      Long-Range Goal: Patient-Specific Goal   Start Date: 01/24/2021  Expected End Date: 01/24/2021  Recent Progress: On track  Priority: High  Note:   Current Barriers:  Unable to achieve control of blood pressure   Pharmacist Clinical Goal(s):  Patient will achieve control of blood pressure as evidenced by home and office readings  through collaboration with PharmD and provider.   Interventions: 1:1 collaboration with [name], MD regarding development and update of comprehensive plan of care as evidenced by provider attestation and co-signature Inter-disciplinary care team collaboration (see longitudinal plan of care) Comprehensive medication review performed; medication list updated in electronic medical record  Hypertension (BP goal <140/90) -Uncontrolled -Current treatment: Spironolactone 25 mg 1 tablet twice daily  Telmisartan 40 mg 1 tablet daily - in PM (evening meal) Furosemide 20 mg 1 tablet as needed -Medications previously tried: chlorthalidone, clonidine, atenolol, hydralazine, amlodipine (swelling), lisinopril, candesartan, azilsartan, olmesartan (change to combo), labetalol (cramping) -Current home readings: 144/84, 148/77, 147/81, 149/81, 123/63, 163/84, 159/84, 142/76, 137/68, 158/76, 134/74 (HR  60s-80s) -Current dietary habits: working on reading package labels -Current exercise habits: does not routinely exercise -Reports hypotensive/hypertensive symptoms -Educated on Daily salt intake goal < 2300 mg; Exercise goal of 150 minutes per week; Importance of home blood pressure monitoring; Proper BP monitoring technique; Symptoms of hypotension and importance of maintaining adequate hydration; -Counseled to monitor BP at home daily, document, and provide log at future appointments -Counseled on diet and exercise extensively Recommended to continue current medication  Fluid retention (Goal: minimize swelling) -Controlled -Current treatment  Furosemide 20 mg 1 tablet as needed -Medications previously tried: none  -Recommended to continue current medication   Hyperlipidemia: (LDL goal < 100) -Controlled -Current treatment: Zetia 10 mg 1 tablet daily (holding) -Medications previously tried: rosuvastatin (cramping)  -Current dietary patterns: eats out some -Current exercise habits: does not routinely exercise -Educated on Cholesterol goals;  Benefits of statin for ASCVD risk reduction; Exercise goal of 150 minutes per week; -Recommended resuming Zetia or rosuvastatin depending on lipid panel results.  Osteopenia (Goal prevent fractures) -Uncontrolled -Last DEXA Scan: 03/18/2019   T-Score femoral neck: -2.6  T-Score total hip: n/a  T-Score lumbar spine: -1.0  T-Score forearm radius: -2.7  10-year probability of major osteoporotic fracture: n/a  10-year probability of hip fracture: n/a -Patient is a candidate for pharmacologic treatment due to T-Score < -2.5 in femoral neck -Current treatment  Vitamin D 1000 units daily -Medications previously tried: none  -Recommend [phone] units of vitamin D daily. Recommend weight-bearing and muscle strengthening exercises for building and maintaining bone density. -Counseled on diet and exercise extensively Reassess at follow  up.  GERD (Goal: minimize symptoms) -Controlled -Current treatment  Esomeprazole 40 mg 1 capsule twice daily before meals -Medications previously tried: none  -Counseled on non-pharmacologic management of symptoms such as elevating the head of your bed, avoiding eating 2-3 hours before bed, avoiding triggering foods such as acidic, spicy, or fatty foods, eating smaller meals, and wearing clothes that are loose around the waist Patient reports her triggers include tomatoes and spicy foods.  Health Maintenance -Vaccine gaps:  shingrix, influenza -Current therapy:  Cetirizine 10 mg 1 tablet daily (took when dizziness started) - vertigo Vitamin B12 - 1000 mcg - sublingual Acetaminophen 500 mg as needed -Educated on Cost vs benefit of each product must be carefully weighed by individual consumer -Patient is satisfied with current therapy and denies issues -Recommended to continue current medication  Patient Goals/Self-Care Activities Patient will:  - take medications as prescribed check blood pressure daily, document, and provide at future appointments target a minimum of 150 minutes of moderate intensity exercise weekly engage in dietary modifications by limiting salt intake  Follow Up Plan: Face to Face appointment with care management team member scheduled for:  1 month        Medication Assistance: None required.  Patient affirms current coverage meets needs.  Compliance/Adherence/Medication fill history: Care Gaps: Shingrix, influenza  Star-Rating Drugs: Telmisartan (Micardis) 40 mg - Last filled 02-03-2021 30 DS at [facility]  Patient's preferred pharmacy is:  [facility], [place] - [address] [place] [address] Phone: [phone] Fax: [phone]  [facility] [place], [place] [address] Phone:  [phone] Fax: [phone]  [facility] - [place], [place] [address] Phone: [phone] Fax: [phone]  Uses pill box? Yes Pt endorses 100% compliance  We discussed: Current pharmacy is preferred with insurance plan and patient is satisfied with pharmacy services Patient decided to: Continue current medication management strategy  Care Plan and Follow Up Patient Decision:  Patient agrees to Care Plan and Follow-up.  Plan: The care management team will reach out to the patient again over the next 30 days.  Jeni Salles, PharmD, [place] Pharmacist [place] [facility] [place]

## 2021-02-23 NOTE — Patient Instructions (Signed)
Hi Robin [name],  I am so glad that your blood pressures have improved and you are feeling much better! I definitely think it's worth calling gastroenterology to set up a visit just to check things out.  Please reach out to me if you have any questions or need anything before our follow up!  Best, Robin [name]  Robin [name], PharmD, [place] [facility] [place]   Visit Information   Goals Addressed   None    Patient Care Plan: CCM Pharmacy Care Plan     Problem Identified: Problem: Hypertension, Hyperlipidemia, GERD, Asthma, and Osteoporosis      Long-Range Goal: Patient-Specific Goal   Start Date: 01/24/2021  Expected End Date: 01/24/2021  Recent Progress: On track  Priority: High  Note:   Current Barriers:  Unable to achieve control of blood pressure   Pharmacist Clinical Goal(s):  Patient will achieve control of blood pressure as evidenced by home and office readings  through collaboration with PharmD and provider.   Interventions: 1:1 collaboration with Robin Macadam, MD regarding development and update of comprehensive plan of care as evidenced by provider attestation and co-signature Inter-disciplinary care team collaboration (see longitudinal plan of care) Comprehensive medication review performed; medication list updated in electronic medical record  Hypertension (BP goal <140/90) -Uncontrolled -Current treatment: Spironolactone 25 mg 1 tablet twice daily  Telmisartan 40 mg 1 tablet daily - in PM (evening meal) Furosemide 20 mg 1 tablet as needed -Medications previously tried: chlorthalidone, clonidine, atenolol, hydralazine, amlodipine (swelling), lisinopril, candesartan, azilsartan, olmesartan (change to combo), labetalol (cramping) -Current home readings: 144/84, 148/77, 147/81, 149/81, 123/63, 163/84, 159/84, 142/76, 137/68, 158/76 (HR 60s-80s) -Current dietary habits: working on reading package labels -Current  exercise habits: does not routinely exercise -Reports hypotensive/hypertensive symptoms -Educated on Daily salt intake goal < 2300 mg; Exercise goal of 150 minutes per week; Importance of home blood pressure monitoring; Proper BP monitoring technique; Symptoms of hypotension and importance of maintaining adequate hydration; -Counseled to monitor BP at home daily, document, and provide log at future appointments -Counseled on diet and exercise extensively Recommended to continue current medication  Fluid retention (Goal: minimize swelling) -Controlled -Current treatment  Furosemide 20 mg 1 tablet as needed -Medications previously tried: none  -Recommended to continue current medication   Hyperlipidemia: (LDL goal < 100) -Controlled -Current treatment: Zetia 10 mg 1 tablet daily (holding) -Medications previously tried: rosuvastatin (cramping)  -Current dietary patterns: eats out some -Current exercise habits: does not routinely exercise -Educated on Cholesterol goals;  Benefits of statin for ASCVD risk reduction; Exercise goal of 150 minutes per week; -Recommended resuming Zetia or rosuvastatin depending on lipid panel results.  Osteopenia (Goal prevent fractures) -Uncontrolled -Last DEXA Scan: 03/18/2019   T-Score femoral neck: -2.6  T-Score total hip: n/a  T-Score lumbar spine: -1.0  T-Score forearm radius: -2.7  10-year probability of major osteoporotic fracture: n/a  10-year probability of hip fracture: n/a -Patient is a candidate for pharmacologic treatment due to T-Score < -2.5 in femoral neck -Current treatment  Vitamin D 1000 units daily -Medications previously tried: none  -Recommend [phone] units of vitamin D daily. Recommend weight-bearing and muscle strengthening exercises for building and maintaining bone density. -Counseled on diet and exercise extensively Reassess at follow up.  GERD (Goal: minimize symptoms) -Controlled -Current treatment  Esomeprazole  40 mg 1 capsule twice daily before meals -Medications previously tried: none  -Counseled on non-pharmacologic management of symptoms such as elevating the head of your bed, avoiding eating 2-3 hours  before bed, avoiding triggering foods such as acidic, spicy, or fatty foods, eating smaller meals, and wearing clothes that are loose around the waist Patient reports her triggers include tomatoes and spicy foods.  Health Maintenance -Vaccine gaps: shingrix, influenza -Current therapy:  Cetirizine 10 mg 1 tablet daily (took when dizziness started) - vertigo Vitamin B12 - 1000 mcg - sublingual Acetaminophen 500 mg as needed -Educated on Cost vs benefit of each product must be carefully weighed by individual consumer -Patient is satisfied with current therapy and denies issues -Recommended to continue current medication  Patient Goals/Self-Care Activities Patient will:  - take medications as prescribed check blood pressure daily, document, and provide at future appointments target a minimum of 150 minutes of moderate intensity exercise weekly engage in dietary modifications by limiting salt intake  Follow Up Plan: Face to Face appointment with care management team member scheduled for:  1 month       Patient verbalizes understanding of instructions provided today and agrees to view in [place].  The pharmacy team will reach out to the patient again over the next 30 days.   Robin [name], [facility]

## 2021-03-09 ENCOUNTER — Other Ambulatory Visit: Payer: Self-pay | Admitting: *Deleted

## 2021-03-09 MED ORDER — ESOMEPRAZOLE MAGNESIUM 40 MG PO CPDR: 40.0000 mg | DELAYED_RELEASE_CAPSULE | Freq: Two times a day (BID) | ORAL | 0 refills | Status: DC

## 2021-03-14 ENCOUNTER — Other Ambulatory Visit: Payer: Self-pay | Admitting: Family Medicine

## 2021-03-14 DIAGNOSIS — E7800 Pure hypercholesterolemia, unspecified: Secondary | ICD-10-CM | POA: Diagnosis not present

## 2021-03-14 DIAGNOSIS — I10 Essential (primary) hypertension: Secondary | ICD-10-CM

## 2021-03-16 ENCOUNTER — Other Ambulatory Visit: Payer: Self-pay | Admitting: Cardiology

## 2021-03-16 DIAGNOSIS — I10 Essential (primary) hypertension: Secondary | ICD-10-CM

## 2021-03-21 ENCOUNTER — Telehealth: Payer: Self-pay | Admitting: Cardiology

## 2021-03-21 MED ORDER — SPIRONOLACTONE 25 MG PO TABS: 25.0000 mg | ORAL_TABLET | Freq: Two times a day (BID) | ORAL | 2 refills | Status: DC

## 2021-03-21 NOTE — Telephone Encounter (Signed)
Prescription sent to pharmacy per patient request.

## 2021-03-21 NOTE — Telephone Encounter (Signed)
Pt c/o medication issue:  1. Name of Medication: spironolactone (ALDACTONE) 25 MG tablet  2. How are you currently taking this medication (dosage and times per day)? One tablet twice daily as directed   3. Are you having a reaction (difficulty breathing--STAT)?   4. What is your medication issue? Patient states her mail order pharmacy sends this medication with a peppermint tablet, and she does not like that. She needs a new rx sent to her local pharmacy . [facility] [place] - [address]If it gets sent there it will not have the flavored tablet.  The patient is almost out of medication, she only has two days left

## 2021-03-28 ENCOUNTER — Encounter: Payer: Self-pay | Admitting: Family Medicine

## 2021-03-28 ENCOUNTER — Ambulatory Visit (INDEPENDENT_AMBULATORY_CARE_PROVIDER_SITE_OTHER): Payer: PPO | Admitting: Family Medicine

## 2021-03-28 VITALS — BP 180/84 | HR 86 | Temp 98.0°F | Ht 62.0 in | Wt 158.5 lb

## 2021-03-28 DIAGNOSIS — D649 Anemia, unspecified: Secondary | ICD-10-CM

## 2021-03-28 DIAGNOSIS — K219 Gastro-esophageal reflux disease without esophagitis: Secondary | ICD-10-CM

## 2021-03-28 DIAGNOSIS — E785 Hyperlipidemia, unspecified: Secondary | ICD-10-CM | POA: Diagnosis not present

## 2021-03-28 DIAGNOSIS — E213 Hyperparathyroidism, unspecified: Secondary | ICD-10-CM | POA: Diagnosis not present

## 2021-03-28 DIAGNOSIS — E559 Vitamin D deficiency, unspecified: Secondary | ICD-10-CM

## 2021-03-28 DIAGNOSIS — I10 Essential (primary) hypertension: Secondary | ICD-10-CM | POA: Diagnosis not present

## 2021-03-28 DIAGNOSIS — M810 Age-related osteoporosis without current pathological fracture: Secondary | ICD-10-CM

## 2021-03-28 DIAGNOSIS — Z23 Encounter for immunization: Secondary | ICD-10-CM

## 2021-03-28 LAB — CBC WITH DIFFERENTIAL/PLATELET
Basophils Absolute: 0 10*3/uL (ref 0.0–0.1)
Basophils Relative: 0.6 % (ref 0.0–3.0)
Eosinophils Absolute: 0 10*3/uL (ref 0.0–0.7)
Eosinophils Relative: 0.7 % (ref 0.0–5.0)
HCT: 41 % (ref 36.0–46.0)
Hemoglobin: 13.7 g/dL (ref 12.0–15.0)
Lymphocytes Relative: 17 % (ref 12.0–46.0)
Lymphs Abs: 1.1 10*3/uL (ref 0.7–4.0)
MCHC: 33.3 g/dL (ref 30.0–36.0)
MCV: 93.6 fl (ref 78.0–100.0)
Monocytes Absolute: 0.5 10*3/uL (ref 0.1–1.0)
Monocytes Relative: 7.4 % (ref 3.0–12.0)
Neutro Abs: 4.8 10*3/uL (ref 1.4–7.7)
Neutrophils Relative %: 74.3 % (ref 43.0–77.0)
Platelets: 311 10*3/uL (ref 150.0–400.0)
RBC: 4.38 Mil/uL (ref 3.87–5.11)
RDW: 12.9 % (ref 11.5–15.5)
WBC: 6.4 10*3/uL (ref 4.0–10.5)

## 2021-03-28 LAB — LIPID PANEL
Cholesterol: 264 mg/dL — ABNORMAL HIGH (ref 0–200)
HDL: 65.7 mg/dL (ref >39.00)
LDL Cholesterol: 172 mg/dL — ABNORMAL HIGH (ref 0–99)
NonHDL: 198.49
Total CHOL/HDL Ratio: 4
Triglycerides: 130 mg/dL (ref 0.0–149.0)
VLDL: 26 mg/dL (ref 0.0–40.0)

## 2021-03-28 LAB — COMPREHENSIVE METABOLIC PANEL
ALT: 12 U/L (ref 0–35)
AST: 17 U/L (ref 0–37)
Albumin: 4.6 g/dL (ref 3.5–5.2)
Alkaline Phosphatase: 89 U/L (ref 39–117)
BUN: 18 mg/dL (ref 6–23)
CO2: 28 mEq/L (ref 19–32)
Calcium: 11.6 mg/dL — ABNORMAL HIGH (ref 8.4–10.5)
Chloride: 102 mEq/L (ref 96–112)
Creatinine, Ser: 0.97 mg/dL (ref 0.40–1.20)
GFR: 53.38 mL/min — ABNORMAL LOW (ref >60.00)
Glucose, Bld: 82 mg/dL (ref 70–99)
Potassium: 4.7 mEq/L (ref 3.5–5.1)
Sodium: 140 mEq/L (ref 135–145)
Total Bilirubin: 0.6 mg/dL (ref 0.2–1.2)
Total Protein: 7.3 g/dL (ref 6.0–8.3)

## 2021-03-28 LAB — VITAMIN D 25 HYDROXY (VIT D DEFICIENCY, FRACTURES): VITD: 46.29 ng/mL (ref 30.00–100.00)

## 2021-03-28 NOTE — Patient Instructions (Addendum)
Try checking some blood pressures a couple of hours after taking medication. Let me know what these numbers look like. We will adjust your home medication pending those readings. You could also check 1-2 hours after your morning medication. These should be your lowest readings and will help [place] adjust your meds.  Let me know numbers over next week.

## 2021-03-28 NOTE — Progress Notes (Signed)
Robin [name] DOB: 1934/03/29 Encounter date: 03/28/2021  This is a 87 y.o. female who presents with Chief Complaint  Patient presents with   Follow-up     History of present illness: Last visit with me was 01/10/2021.  At that time she was feeling somewhat better off labetalol, but was having issues with tolerating Bystolic.  We consulted pharmacy to help [place] due to multiple medication intolerances.  She had recommended stopping Bystolic and switching losartan to telmisartan.  Currently taking telmisartan 40 mg in the evening, spironolactone 25 mg twice daily.  From day she took first dose of bystolic stomach has been a mess. Hasn't let up since. Taking nexium twice daily, taking Gaviscon, taking cocktail. Does have follow up with GI in 8 days. Just hurts, rolls, growls. Has a lot of gas.   Bp when she gets up in the morning is very high. Checks in morning and then evening. First check is high. Gets headache at night after taking medication. Headache gone by time she wakes up. Most of time blood pressure comes down after medication. Doesn't recheck bp after taking evening dose of medication. Rarely dizzy, no longer having fainting spells.   Stopped the zetia before her last visit with me; stopped crestor as well but didn't note improvement so zetia was held.    Allergies  Allergen Reactions   Prilosec [Omeprazole] Cough   Amlodipine     Feet and ankle swelling   Protonix [Pantoprazole Sodium] Swelling   Amoxicillin Rash   Metronidazole Rash   Current Meds  Medication Sig   acetaminophen (TYLENOL) 500 MG tablet Take 500-1,000 mg by mouth every 6 (six) hours as needed for mild pain or headache.    cetirizine (ZYRTEC) 10 MG tablet Take 10 mg by mouth daily.   Cholecalciferol 25 MCG (1000 UT) TBDP Take 1,000 Units by mouth daily.   esomeprazole (NEXIUM) 40 MG capsule Take 1 capsule (40 mg total) by mouth 2 (two) times daily before a meal.   ezetimibe (ZETIA) 10 MG tablet TAKE 1 BY  MOUTH DAILY   furosemide (LASIX) 20 MG tablet 1 tablet by mouth every other day as needed for swelling   spironolactone (ALDACTONE) 25 MG tablet Take 1 tablet (25 mg total) by mouth 2 (two) times daily.   telmisartan (MICARDIS) 40 MG tablet Take 1 tablet (40 mg total) by mouth daily.    Review of Systems  Constitutional:  Positive for fatigue. Negative for chills and fever.  Respiratory:  Negative for cough, chest tightness, shortness of breath and wheezing.   Cardiovascular:  Negative for chest pain, palpitations and leg swelling.  Gastrointestinal:  Negative for abdominal pain, blood in stool, constipation, diarrhea, nausea and vomiting.       Upset stomach   Objective:  Pulse 86   Temp 98 F (36.7 C) (Oral)   Ht 5\' 2"  (1.575 m)   Wt 158 lb 8 oz (71.9 kg)   SpO2 96%   BMI 28.99 kg/m   Weight: 158 lb 8 oz (71.9 kg)   BP Readings from Last 3 Encounters:  01/10/21 (!) 160/80  12/03/20 (!) 150/78  10/18/20 (!) 146/62   Wt Readings from Last 3 Encounters:  03/28/21 158 lb 8 oz (71.9 kg)  01/10/21 158 lb 11.2 oz (72 kg)  12/03/20 159 lb 8 oz (72.3 kg)    Physical Exam Constitutional:      General: She is not in acute distress.    Appearance: She is well-developed.  Cardiovascular:  Rate and Rhythm: Normal rate and regular rhythm.     Heart sounds: Normal heart sounds. No murmur heard.   No friction rub.  Pulmonary:     Effort: Pulmonary effort is normal. No respiratory distress.     Breath sounds: Normal breath sounds. No wheezing or rales.  Abdominal:     General: Abdomen is flat. Bowel sounds are normal.     Palpations: Abdomen is soft.  Musculoskeletal:     Right lower leg: No edema.     Left lower leg: No edema.  Neurological:     Mental Status: She is alert and oriented to person, place, and time.  Psychiatric:        Behavior: Behavior normal.    Assessment/Plan  1. Hypertension, essential, benign I would like to increase her medication, but she has  had issues with low pressures in past, so I have asked her to record after taking medication at home so we can see what her lowest readings at home are rather than her checking prior to taking medication.   Overall she feels better on this current regimen than she has in awhile. The dizzy and light headed episodes have stopped.   - CBC with Differential/Platelet; Future - Comprehensive metabolic panel; Future - Comprehensive metabolic panel - CBC with Differential/Platelet  2. Anemia, unspecified type Recheck bloodwork.   3. Gastroesophageal reflux disease, unspecified whether esophagitis present She has follow up with GI next week; she has had stomach discomfort on and off for a long time; has noted exacerbations frequently with new medications.   4. Hyperparathyroidism ([place]) - PTH, Intact and Calcium  5. Osteoporosis of femur without pathological fracture She was following with endocrinology, Dr. Dwyane Dee. Last visit was last year. Per his note:  "OSTEOPOROSIS: Her lowest bone density is now -2.7 at the wrist and also has progression at the hip Discussed in detail with her and her daughter the increased fracture risk from this and the need for treatment and prevention of further deterioration of bone density   For simplicity she will start with a bisphosphonate and if Actonel is covered she can start 150 mg monthly Discussed how this will be taken and occasional GI side effects This would be preferred to Fosamax since she has some history of reflux"   She was supposed to follow up in 4 months, but did not do so. Will encourage her to return to endo given hyperparathyroid/calcium levels as well as bone density management.   6. Dyslipidemia - Lipid panel; Future - Lipid panel  7. Vitamin D deficiency - VITAMIN D 25 Hydroxy (Vit-D Deficiency, Fractures); Future - VITAMIN D 25 Hydroxy (Vit-D Deficiency, Fractures)  8. Need for immunization against influenza - Flu Vaccine QUAD High  Dose(Fluad)  Return in about 3 months (around 06/28/2021).      Micheline Rough, MD

## 2021-03-29 LAB — PTH, INTACT AND CALCIUM
Calcium: 11.5 mg/dL — ABNORMAL HIGH (ref 8.6–10.4)
PTH: 76 pg/mL (ref 16–77)

## 2021-04-04 ENCOUNTER — Encounter: Payer: Self-pay | Admitting: Family Medicine

## 2021-04-05 ENCOUNTER — Ambulatory Visit (INDEPENDENT_AMBULATORY_CARE_PROVIDER_SITE_OTHER): Payer: PPO | Admitting: Physician Assistant

## 2021-04-05 ENCOUNTER — Encounter: Payer: Self-pay | Admitting: Physician Assistant

## 2021-04-05 VITALS — BP 210/90 | HR 88 | Ht 59.5 in | Wt 158.5 lb

## 2021-04-05 DIAGNOSIS — R1013 Epigastric pain: Secondary | ICD-10-CM | POA: Diagnosis not present

## 2021-04-05 MED ORDER — SUCRALFATE 1 G PO TABS: 1.0000 g | ORAL_TABLET | Freq: Four times a day (QID) | ORAL | 2 refills | Status: DC

## 2021-04-05 NOTE — Progress Notes (Signed)
Chief Complaint: Epigastric abdominal pain  HPI:    Robin [name] is an 87 year old female with a past medical history as listed below, known to Dr. Fuller Plan, who returns to clinic today for epigastric pain.    Patient seen multiple times in the past for epigastric abdominal pain, see those notes.  Last seen 07/09/2020 and was accompanied by her daughter.  Described that her constipation is under control but she continued with epigastric issues.  Felt like something is in her throat constantly regardless of using her Nexium 40 mg twice daily.  At that time patient scheduled for an EGD with dilation.  She is continued on Nexium 40 mg twice daily.      07/16/2020 EGD for dysphagia and globus sensation with benign-appearing esophageal stenosis dilated to 17 mm, 3 gastric polyps which were biopsied and small hiatal hernia.  Pathology showed fundic gland polyps.    08/09/2020 patient called and was having some "stomach pain", she was requesting the GI cocktail.  She was told to go ahead and do so.    03/28/2021 patient saw PCP and they discussed that since she took her first dose of Bystolic her stomach had been a mess and had not let up since.  She is taking her Nexium twice daily, Gaviscon and GI cocktail.    Today, the patient tells me that she started Bystolic 5 mg on August 10 and immediately after taking that dose that day she started with epigastric discomfort/pain.  Tells me that they later decreased the dose to 2.5 mg which she took for another 2 weeks but then stopped this completely in early September due to her ongoing abdominal discomfort.  Tells me she continues with a 7/10 discomfort which she cannot really describe associated with nausea about 80 to 90% of the time.  This is not changed with eating at all.  Tells me she is still taking her Nexium 40 mg twice a day and using Gaviscon as needed as well as GI cocktail.  The GI cocktail and Gaviscon work for short periods of time but seem to wear off  within 30 minutes.  No change in bowel habits.    She and her daughter are frustrated with the fact that her blood pressures been uncontrolled over the past year.  They have another telemedicine visit with her PCP tomorrow to discuss further.    Denies fever, chills, weight loss, vomiting or symptoms that awaken her from sleep.  Past Medical History:  Diagnosis Date   Abnormal Pap smear of cervix    Allergy    Arthritis    Asthma    Cataract 2018   bilateral eyes   Colon polyps    tubulovillous adenoma, 2010   External hemorrhoids without mention of complication    GERD (gastroesophageal reflux disease)    Headache    eval with neuro in 2011   Hyperlipemia    Hyperparathyroidism ([place])    seeing Dr. Dwyane Dee in endocrinology   Hypertension    IBS (irritable bowel syndrome), GERD, hx colon polyps, chronic abd pain - followed by Dr. Sharlett Iles in GI 06/11/2012   Osteoporosis 08/29/2011   Vertigo    eval with neuro in 2011, brief recurrence 2016    Past Surgical History:  Procedure Laterality Date   BACK SURGERY  1974   CARPAL TUNNEL RELEASE Left 06/2008   COLONOSCOPY     EYE SURGERY  07/2017   bilateral cataract extraction   POLYPECTOMY     TONSILLECTOMY  UPPER GASTROINTESTINAL ENDOSCOPY      Current Outpatient Medications  Medication Sig Dispense Refill   acetaminophen (TYLENOL) 500 MG tablet Take 500-1,000 mg by mouth every 6 (six) hours as needed for mild pain or headache.      cetirizine (ZYRTEC) 10 MG tablet Take 10 mg by mouth daily.     Cholecalciferol 25 MCG (1000 UT) TBDP Take 1,000 Units by mouth daily. 90 tablet 3   esomeprazole (NEXIUM) 40 MG capsule Take 1 capsule (40 mg total) by mouth 2 (two) times daily before a meal. 180 capsule 0   ezetimibe (ZETIA) 10 MG tablet TAKE 1 BY MOUTH DAILY 90 tablet 1   furosemide (LASIX) 20 MG tablet 1 tablet by mouth every other day as needed for swelling 45 tablet 3   spironolactone (ALDACTONE) 25 MG tablet Take 1 tablet (25  mg total) by mouth 2 (two) times daily. 180 tablet 2   telmisartan (MICARDIS) 40 MG tablet Take 1 tablet (40 mg total) by mouth daily. 90 tablet 1   No current facility-administered medications for this visit.    Allergies as of 04/05/2021 - Review Complete 03/28/2021  Allergen Reaction Noted   Prilosec [omeprazole] Cough 08/16/2016   Amlodipine  02/12/2019   Protonix [pantoprazole sodium] Swelling 03/14/2016   Amoxicillin Rash 04/28/2009   Metronidazole Rash 04/28/2009    Family History  Problem Relation Age of Onset   Diabetes Brother    Hypertension Brother    Stroke Brother 60   Hypertension Father    Stroke Father    Diabetes Sister    Hypertension Sister    Heart disease Sister        heart failure   CAD Mother        Died of MI at age 24   Hypertension Mother    CAD Brother        Died of MI at age 23   Hypertension Brother    Stroke Sister 70   Hypertension Sister    Stroke Brother 48   Hypertension Brother    Colon cancer Neg Hx    Colon polyps Neg Hx    Esophageal cancer Neg Hx    Pancreatic cancer Neg Hx    Stomach cancer Neg Hx    Rectal cancer Neg Hx     Social History   Socioeconomic History   Marital status: Widowed    Spouse name: Not on file   Number of children: 2   Years of education: Not on file   Highest education level: Not on file  Occupational History   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  Tobacco Use   Smoking status: Never   Smokeless tobacco: Never  Vaping Use   Vaping Use: Never used  Substance and Sexual Activity   Alcohol use: No    Alcohol/week: 0.0 standard drinks   Drug use: No   Sexual activity: Not Currently    Birth control/protection: Post-menopausal  Other Topics Concern   Not on file  Social History Narrative   Lives alone in a one story home.  Has 2 children, one son and one daughter with 2 grandkids and 1 great-grandchild.    Retired from working for a bank.     Education:  high school.    Social Determinants of Health   Financial Resource Strain: Low Risk    Difficulty of Paying Living Expenses: Not hard at all  Food Insecurity: No Food Insecurity  Worried About Charity fundraiser in the Last Year: Never true   [place] in the Last Year: Never true  Transportation Needs: No Transportation Needs   Lack of Transportation (Medical): No   Lack of Transportation (Non-Medical): No  Physical Activity: Inactive   Days of Exercise per Week: 0 days   Minutes of Exercise per Session: 0 min  Stress: No Stress Concern Present   Feeling of Stress : Not at all  Social Connections: Socially Isolated   Frequency of Communication with Friends and Family: More than three times a week   Frequency of Social Gatherings with Friends and Family: Once a week   Attends Religious Services: Never   Marine scientist or Organizations: No   Attends Archivist Meetings: Never   Marital Status: Widowed  Human resources officer Violence: Not At Risk   Fear of Current or Ex-Partner: No   Emotionally Abused: No   Physically Abused: No   Sexually Abused: No    Review of Systems:    Constitutional: No weight loss, fever or chills Cardiovascular: No chest pain Respiratory: No SOB  Gastrointestinal: See HPI and otherwise negative   Physical Exam:  Vital signs: BP (!) 210/90 (BP Location: Left Arm, Patient Position: Sitting, Cuff Size: Normal)   Pulse 88   Ht 4' 11.5" (1.511 m) Comment: height measured without shoes  Wt 158 lb 8 oz (71.9 kg)   BMI 31.48 kg/m    Constitutional:   Pleasant Elderly Caucasian female appears to be in NAD, Well developed, Well nourished, alert and cooperative Respiratory: Respirations even and unlabored. Lungs clear to auscultation bilaterally.   No wheezes, crackles, or rhonchi.  Cardiovascular: Normal S1, S2. No MRG. Regular rate and rhythm. No peripheral edema, cyanosis or pallor.  Gastrointestinal:  Soft, nondistended, mild  epiagastric ttp,  No rebound or guarding. Normal bowel sounds. No appreciable masses or hepatomegaly. Rectal:  Not performed.  Psychiatric: Demonstrates good judgement and reason without abnormal affect or behaviors.  RELEVANT LABS AND IMAGING: CBC    Component Value Date/Time   WBC 6.4 03/28/2021 1056   RBC 4.38 03/28/2021 1056   HGB 13.7 03/28/2021 1056   HCT 41.0 03/28/2021 1056   PLT 311.0 03/28/2021 1056   MCV 93.6 03/28/2021 1056   MCH 30.9 02/02/2020 1000   MCHC 33.3 03/28/2021 1056   RDW 12.9 03/28/2021 1056   LYMPHSABS 1.1 03/28/2021 1056   MONOABS 0.5 03/28/2021 1056   EOSABS 0.0 03/28/2021 1056   BASOSABS 0.0 03/28/2021 1056    CMP     Component Value Date/Time   NA 140 03/28/2021 1056   NA 144 03/16/2020 1152   K 4.7 03/28/2021 1056   CL 102 03/28/2021 1056   CO2 28 03/28/2021 1056   GLUCOSE 82 03/28/2021 1056   BUN 18 03/28/2021 1056   BUN 14 03/16/2020 1152   CREATININE 0.97 03/28/2021 1056   CREATININE 0.91 (H) 02/02/2020 1000   CALCIUM 11.5 (H) 03/28/2021 1056   CALCIUM 11.6 (H) 03/28/2021 1056   PROT 7.3 03/28/2021 1056   ALBUMIN 4.6 03/28/2021 1056   AST 17 03/28/2021 1056   ALT 12 03/28/2021 1056   ALKPHOS 89 03/28/2021 1056   BILITOT 0.6 03/28/2021 1056   GFRNONAA 64 03/16/2020 1152   GFRAA 74 03/16/2020 1152    Assessment: 1.  Epigastric pain: History of gastritis and reflux typically on Nexium 40 mg twice daily, this was controlled over the past 8 months until patient started  Bystolic which seems to have irritated things per her, now using Gaviscon as well as GI cocktail which is still not relieving her pain; consider most likely reactive gastropathy  Plan: 1.  Prescribed Carafate 1 g 4 times daily, an hour before or 2 hours after other medications.  Prescribed #120 with 3 refills.  Discussed specific timing of this so that she is not taking it around her blood pressure meds specifically or her Nexium.  We do not want it to interfere. 2.   Reviewed antireflux diet and lifestyle modifications. 3.  Encouraged the patient to follow with her PCP in regards to hypertension. 4.  Told the patient to call and let us know how she is doing in the next couple of weeks, certainly if she does not feel any better.  At that time would add a Pepcid 40 mg every morning and nightly.  Ellouise Newer, PA-C [facility] 04/05/2021, 10:03 AM  Cc: [name], MD

## 2021-04-05 NOTE — Patient Instructions (Addendum)
If you are age 85 or older, your body mass index should be between 23-30. Your Body mass index is 31.48 kg/m. If this is out of the aforementioned range listed, please consider follow up with your Primary Care Provider. ________________________________________________________  The [facility] providers would like to encourage you to use [facility] to communicate with providers for non-urgent requests or questions.  Due to long hold times on the telephone, sending your provider a message by [facility] may be a faster and more efficient way to get a response.  Please allow 48 business hours for a response.  Please remember that this is for non-urgent requests.  _______________________________________________________  We have sent the following medications to your pharmacy for you to pick up at your convenience:  START: Carafate 1 gram four times daily. Take 1 hour before or 2 hours after other medications.  Thank you for entrusting me with your care and choosing [facility].  Ellouise Newer, PA-C

## 2021-04-05 NOTE — Progress Notes (Signed)
Reviewed and agree with management plan.  Nashla Althoff T. Cataldo Cosgriff, MD FACG 

## 2021-04-06 ENCOUNTER — Telehealth (INDEPENDENT_AMBULATORY_CARE_PROVIDER_SITE_OTHER): Payer: PPO | Admitting: Family Medicine

## 2021-04-06 ENCOUNTER — Encounter: Payer: Self-pay | Admitting: Family Medicine

## 2021-04-06 VITALS — BP 166/80

## 2021-04-06 DIAGNOSIS — I10 Essential (primary) hypertension: Secondary | ICD-10-CM

## 2021-04-06 MED ORDER — HYDROXYZINE HCL 10 MG PO TABS: 10.0000 mg | ORAL_TABLET | Freq: Every evening | ORAL | 2 refills | Status: DC | PRN

## 2021-04-06 NOTE — Progress Notes (Signed)
Virtual Visit via Video Note  I connected with Robin [name]  on 04/06/21 at 11:00 AM EST by a video enabled telemedicine application and verified that I am speaking with the correct person using two identifiers.  Location patient: home Location provider: [place], [place] [address] Persons participating in the virtual visit: patient, provider  I discussed the limitations of evaluation and management by telemedicine and the availability of in person appointments. The patient expressed understanding and agreed to proceed.   Robin [name] DOB: 05-Nov-1933 Encounter date: 04/06/2021  This is a 87 y.o. female who presents with hypertension   History of present illness: Blood pressure steadily increasing. This morning was 166/80's.  Last 2 weeks bp has been increasing. Headache waking her at night; feels like someone hitting on top of head.    Doesn't usually have headache in daytime. Usually not hurting that badly once she is up for day.   Takes telmisartan at dinner time - usually 6.  Gets headache sometimes as early as 10 oclock; then keeping headache through the night. Up and down anyway to go to the bathroom. Gets anxious at night (always has). Not taking anything for sleep. Doesn't want to be on sleep medication.   Robin [name] is at daughter's house now. Call back on cell phone.    Allergies  Allergen Reactions   Prilosec [Omeprazole] Cough   Amlodipine     Feet and ankle swelling   Protonix [Pantoprazole Sodium] Swelling   Amoxicillin Rash   Flagyl [Metronidazole] Rash   No outpatient medications have been marked as taking for the 04/06/21 encounter (Video Visit) with [name], MD.    Review of Systems  Constitutional:  Negative for chills, fatigue and fever.  Respiratory:  Negative for cough, chest tightness, shortness of breath and wheezing.   Cardiovascular:  Negative for chest pain, palpitations and leg swelling.   Gastrointestinal:  Negative for abdominal pain.  Neurological:  Positive for headaches. Negative for dizziness (not having dizzy episodes like she did previously).   Objective:  There were no vitals taken for this visit.      BP Readings from Last 3 Encounters:  04/05/21 (!) 210/90  03/28/21 (!) 180/84  01/10/21 (!) 160/80   Wt Readings from Last 3 Encounters:  04/05/21 158 lb 8 oz (71.9 kg)  03/28/21 158 lb 8 oz (71.9 kg)  01/10/21 158 lb 11.2 oz (72 kg)    EXAM:  GENERAL: alert, oriented, appears well and in no acute distress  HEENT: atraumatic, conjunctiva clear, no obvious abnormalities on inspection of external nose and ears  NECK: normal movements of the head and neck  LUNGS: on inspection no signs of respiratory distress, breathing rate appears normal, no obvious gross SOB, gasping or wheezing  CV: no obvious cyanosis  MS: moves all visible extremities without noticeable abnormality  PSYCH/NEURO: pleasant and cooperative, no obvious depression or anxiety, speech and thought processing grossly intact   Assessment/Plan  1. Hypertension, essential, benign Discussed patient with pharmacist. Patient has had multiple med intolerances, and seemed to do better with current regimen. Uncertain that evening headache is a result of medication due to timing. I have asked her to check bp when she has a headache in middle of the night. We want to see what her pressures are at that time. For now, since she tends to run on the higher end, we are going to increase her telmisartan to 1.5 tablets nightly and  she will continue with her other regular medications. She will update me next week with pressures and we will continue to try to adjust medications to control blood pressure but also minimize  side effects.      I discussed the assessment and treatment plan with the patient. The patient was provided an opportunity to ask questions and all were answered. The patient agreed with the  plan and demonstrated an understanding of the instructions.   The patient was advised to call back or seek an in-person evaluation if the symptoms worsen or if the condition fails to improve as anticipated.  I provided 20 minutes of face-to-face time during this encounter.   Micheline Rough, MD

## 2021-04-11 ENCOUNTER — Ambulatory Visit: Payer: PPO | Admitting: Family Medicine

## 2021-04-11 DIAGNOSIS — D3131 Benign neoplasm of right choroid: Secondary | ICD-10-CM | POA: Diagnosis not present

## 2021-04-11 DIAGNOSIS — H0100A Unspecified blepharitis right eye, upper and lower eyelids: Secondary | ICD-10-CM | POA: Diagnosis not present

## 2021-04-11 DIAGNOSIS — H52203 Unspecified astigmatism, bilateral: Secondary | ICD-10-CM | POA: Diagnosis not present

## 2021-04-11 DIAGNOSIS — H18593 Other hereditary corneal dystrophies, bilateral: Secondary | ICD-10-CM | POA: Diagnosis not present

## 2021-04-15 ENCOUNTER — Encounter: Payer: Self-pay | Admitting: Family Medicine

## 2021-04-18 NOTE — Progress Notes (Signed)
HPI:FU dizziness and hypertension. ABIs August 2010 normal. Stress echo February 2014 normal. MRI July 2018 showed chronic small vessel ischemic changes in the cerebral white matter and pons. MRA without significant vertebrobasilar disease. CTA showed normal circle of Willis. EEG also normal. Patient has chronic mild dizziness. CTA September 2020 showed no pulmonary embolus. There was note of coronary artery disease/aortic atherosclerosis. Monitor October 2021 showed sinus rhythm with PACs, PVCs and brief PAT. There was also 6 beats of nonsustained ventricular tachycardia.  Echocardiogram April 2022 showed normal LV function, mild mitral regurgitation; mild aortic stenosis with mean gradient 10 mmHg.  Since last seen, she was in the emergency room over the weekend with elevated blood pressure.  She was placed on metoprolol.  She denies increased dyspnea, chest pain or syncope.  She states her blood pressure continues to run high.  She has not tolerated multiple medications in the past.  Current Outpatient Medications  Medication Sig Dispense Refill   acetaminophen (TYLENOL) 500 MG tablet Take 500-1,000 mg by mouth every 6 (six) hours as needed for mild pain or headache.      cetirizine (ZYRTEC) 10 MG tablet Take 10 mg by mouth daily.     Cholecalciferol 25 MCG (1000 UT) TBDP Take 1,000 Units by mouth daily. 90 tablet 3   esomeprazole (NEXIUM) 40 MG capsule Take 1 capsule (40 mg total) by mouth 2 (two) times daily before a meal. 180 capsule 0   ezetimibe (ZETIA) 10 MG tablet TAKE 1 BY MOUTH DAILY 90 tablet 1   furosemide (LASIX) 20 MG tablet 1 tablet by mouth every other day as needed for swelling 45 tablet 3   hydrOXYzine (ATARAX/VISTARIL) 10 MG tablet Take 1-2 tablets (10-20 mg total) by mouth at bedtime as needed for anxiety. 30 tablet 2   spironolactone (ALDACTONE) 25 MG tablet Take 1 tablet (25 mg total) by mouth 2 (two) times daily. 180 tablet 2   sucralfate (CARAFATE) 1 g tablet Take 1  tablet (1 g total) by mouth 4 (four) times daily. Take 1 hour before or 2 hours after other medications 120 tablet 2   No current facility-administered medications for this visit.     Past Medical History:  Diagnosis Date   Abnormal Pap smear of cervix    Allergy    Arthritis    Asthma    Cataract 2018   bilateral eyes   Colon polyps    tubulovillous adenoma, 2010   External hemorrhoids without mention of complication    GERD (gastroesophageal reflux disease)    Headache    eval with neuro in 2011   Hyperlipemia    Hyperparathyroidism ([place])    seeing Dr. Dwyane Dee in endocrinology   Hypertension    IBS (irritable bowel syndrome), GERD, hx colon polyps, chronic abd pain - followed by Dr. Sharlett Iles in GI 06/11/2012   Osteoporosis 08/29/2011   Vertigo    eval with neuro in 2011, brief recurrence 2016    Past Surgical History:  Procedure Laterality Date   BACK SURGERY  1974   CARPAL TUNNEL RELEASE Left 06/2008   COLONOSCOPY     EYE SURGERY  07/2017   bilateral cataract extraction   POLYPECTOMY     TONSILLECTOMY     UPPER GASTROINTESTINAL ENDOSCOPY      Social History   Socioeconomic History   Marital status: Widowed    Spouse name: Not on file   Number of children: 2   Years of education: Not on  file   Highest education level: Not on file  Occupational History   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  Tobacco Use   Smoking status: Never   Smokeless tobacco: Never  Vaping Use   Vaping Use: Never used  Substance and Sexual Activity   Alcohol use: No    Alcohol/week: 0.0 standard drinks   Drug use: No   Sexual activity: Not Currently    Birth control/protection: Post-menopausal  Other Topics Concern   Not on file  Social History Narrative   Lives alone in a one story home.  Has 2 children, one son and one daughter with 2 grandkids and 1 great-grandchild.    Retired from working for a bank.     Education: high school.    Social  Determinants of Health   Financial Resource Strain: Low Risk    Difficulty of Paying Living Expenses: Not hard at all  Food Insecurity: No Food Insecurity   Worried About Charity fundraiser in the Last Year: Never true   [place] in the Last Year: Never true  Transportation Needs: No Transportation Needs   Lack of Transportation (Medical): No   Lack of Transportation (Non-Medical): No  Physical Activity: Inactive   Days of Exercise per Week: 0 days   Minutes of Exercise per Session: 0 min  Stress: No Stress Concern Present   Feeling of Stress : Not at all  Social Connections: Socially Isolated   Frequency of Communication with Friends and Family: More than three times a week   Frequency of Social Gatherings with Friends and Family: Once a week   Attends Religious Services: Never   Marine scientist or Organizations: No   Attends Archivist Meetings: Never   Marital Status: Widowed  Human resources officer Violence: Not At Risk   Fear of Current or Ex-Partner: No   Emotionally Abused: No   Physically Abused: No   Sexually Abused: No    Family History  Problem Relation Age of Onset   Diabetes Brother    Hypertension Brother    Stroke Brother 3   Hypertension Father    Stroke Father    Diabetes Sister    Hypertension Sister    Heart disease Sister        heart failure   CAD Mother        Died of MI at age 5   Hypertension Mother    CAD Brother        Died of MI at age 33   Hypertension Brother    Stroke Sister 35   Hypertension Sister    Stroke Brother 48   Hypertension Brother    Colon cancer Neg Hx    Colon polyps Neg Hx    Esophageal cancer Neg Hx    Pancreatic cancer Neg Hx    Stomach cancer Neg Hx    Rectal cancer Neg Hx     ROS: no fevers or chills, productive cough, hemoptysis, dysphasia, odynophagia, melena, hematochezia, dysuria, hematuria, rash, seizure activity, orthopnea, PND, pedal edema, claudication. Remaining systems are  negative.  Physical Exam: Well-developed well-nourished in no acute distress.  Skin is warm and dry.  HEENT is normal.  Neck is supple.  Chest is clear to auscultation with normal expansion.  Cardiovascular exam is regular rate and rhythm.  Abdominal exam nontender or distended. No masses palpated. Extremities show no edema. neuro grossly intact  A/P  1 history  of syncope-previous episode was felt likely to be vagal in origin.  Previous echocardiogram showed normal LV function.  We will consider implantable loop monitor in the future if she has recurrences.  2 coronary artery disease-based on previous CTA demonstrating coronary calcification.  Continue statin.  3 hypertension-blood pressure elevated; difficult situation.  She has not tolerated multiple medications in the past.  I have asked her to send [place] a list of these intolerances.  Her heart rate is 48 and I will therefore wean metoprolol to off (12.5 mg twice daily for 2 days then off).  Add hydralazine 25 mg 3 times daily and follow blood pressure.  I will try and adjust medications based on what medications she has tried and not tolerated in the past.  4 hyperlipidemia-new Zetia and statin.  5 mild aortic stenosis-she will need follow-up echoes in the future.  6 chronic dizziness-previous evaluation unremarkable.  Kirk Ruths, MD

## 2021-04-19 ENCOUNTER — Encounter: Payer: Self-pay | Admitting: Family Medicine

## 2021-04-20 ENCOUNTER — Telehealth: Payer: Self-pay

## 2021-04-20 ENCOUNTER — Other Ambulatory Visit: Payer: Self-pay | Admitting: Family Medicine

## 2021-04-20 ENCOUNTER — Telehealth: Payer: Self-pay | Admitting: Pharmacist

## 2021-04-20 MED ORDER — TELMISARTAN 40 MG PO TABS: 40.0000 mg | ORAL_TABLET | Freq: Every day | ORAL | 1 refills | Status: DC

## 2021-04-20 NOTE — Telephone Encounter (Signed)
Patient called in regards to message she sent and wants PCP to know she only has a 3 day supply and that her B/p is running in the 200 range patient wants a call back to discuss.

## 2021-04-20 NOTE — Telephone Encounter (Signed)
Called patient to follow up on mychart message. Patient reports she is still having headaches but they aren't as severe and they last all day long. Her blood pressure readings are still mostly running > 150/90 and sometimes even after meds: 146/80, 149/73 (evening after med), 128/77 (am), 153/80 (after telm), 169/84 (am), 136/72, 180/88 (before meds), 150/80 (after meds).  Recommended for patient to increase to 2 tablets of telmisartan for a total of 80 mg per evening. Plan to follow up in 2 weeks.  Patient is not interested in trying the hydroxyzine as she was worried about the risk for dependence. Reassured her this would not be the case for this medication and it may help her blood pressures to get a good night's sleep.

## 2021-04-20 NOTE — Telephone Encounter (Signed)
Message sent to [facility] through patient advice request. Med refilled.

## 2021-04-22 NOTE — Telephone Encounter (Signed)
Thanks

## 2021-04-23 ENCOUNTER — Telehealth: Payer: Self-pay

## 2021-04-23 ENCOUNTER — Emergency Department (HOSPITAL_COMMUNITY)
Admission: EM | Admit: 2021-04-23 | Discharge: 2021-04-23 | Disposition: A | Discharge status: Home / Self Care | Payer: PPO | Attending: Emergency Medicine | Admitting: Emergency Medicine

## 2021-04-23 ENCOUNTER — Encounter (HOSPITAL_COMMUNITY): Payer: Self-pay | Admitting: Emergency Medicine

## 2021-04-23 ENCOUNTER — Other Ambulatory Visit: Payer: Self-pay

## 2021-04-23 ENCOUNTER — Emergency Department (HOSPITAL_COMMUNITY): Payer: PPO

## 2021-04-23 DIAGNOSIS — R519 Headache, unspecified: Secondary | ICD-10-CM

## 2021-04-23 DIAGNOSIS — R110 Nausea: Secondary | ICD-10-CM | POA: Diagnosis not present

## 2021-04-23 DIAGNOSIS — J45909 Unspecified asthma, uncomplicated: Secondary | ICD-10-CM | POA: Insufficient documentation

## 2021-04-23 DIAGNOSIS — I10 Essential (primary) hypertension: Secondary | ICD-10-CM | POA: Insufficient documentation

## 2021-04-23 LAB — COMPREHENSIVE METABOLIC PANEL
ALT: 13 U/L (ref 0–44)
AST: 18 U/L (ref 15–41)
Albumin: 4.1 g/dL (ref 3.5–5.0)
Alkaline Phosphatase: 68 U/L (ref 38–126)
Anion gap: 7 (ref 5–15)
BUN: 18 mg/dL (ref 8–23)
CO2: 25 mmol/L (ref 22–32)
Calcium: 10.8 mg/dL — ABNORMAL HIGH (ref 8.9–10.3)
Chloride: 106 mmol/L (ref 98–111)
Creatinine, Ser: 0.82 mg/dL (ref 0.44–1.00)
GFR, Estimated: >60 mL/min (ref >60)
Glucose, Bld: 89 mg/dL (ref 70–99)
Potassium: 3.6 mmol/L (ref 3.5–5.1)
Sodium: 138 mmol/L (ref 135–145)
Total Bilirubin: 0.8 mg/dL (ref 0.3–1.2)
Total Protein: 6.7 g/dL (ref 6.5–8.1)

## 2021-04-23 LAB — CBC WITH DIFFERENTIAL/PLATELET
Abs Immature Granulocytes: 0.02 10*3/uL (ref 0.00–0.07)
Basophils Absolute: 0 10*3/uL (ref 0.0–0.1)
Basophils Relative: 0 %
Eosinophils Absolute: 0 10*3/uL (ref 0.0–0.5)
Eosinophils Relative: 0 %
HCT: 40.4 % (ref 36.0–46.0)
Hemoglobin: 13 g/dL (ref 12.0–15.0)
Immature Granulocytes: 0 %
Lymphocytes Relative: 14 %
Lymphs Abs: 1.2 10*3/uL (ref 0.7–4.0)
MCH: 30.7 pg (ref 26.0–34.0)
MCHC: 32.2 g/dL (ref 30.0–36.0)
MCV: 95.5 fL (ref 80.0–100.0)
Monocytes Absolute: 0.5 10*3/uL (ref 0.1–1.0)
Monocytes Relative: 6 %
Neutro Abs: 6.6 10*3/uL (ref 1.7–7.7)
Neutrophils Relative %: 80 %
Platelets: 309 10*3/uL (ref 150–400)
RBC: 4.23 MIL/uL (ref 3.87–5.11)
RDW: 12.5 % (ref 11.5–15.5)
WBC: 8.4 10*3/uL (ref 4.0–10.5)
nRBC: 0 % (ref 0.0–0.2)

## 2021-04-23 MED ORDER — METOPROLOL TARTRATE 25 MG PO TABS: 25.0000 mg | ORAL_TABLET | Freq: Two times a day (BID) | ORAL | 0 refills | Status: DC

## 2021-04-23 MED ORDER — METOPROLOL TARTRATE 5 MG/5ML IV SOLN
5.0000 mg | Freq: Once | INTRAVENOUS | Status: AC
  Administered 2021-04-23: 5 mg via INTRAVENOUS
  Filled 2021-04-23: qty 5

## 2021-04-23 MED ORDER — DIPHENHYDRAMINE HCL 50 MG/ML IJ SOLN
12.5000 mg | Freq: Once | INTRAMUSCULAR | Status: AC
  Administered 2021-04-23: 12.5 mg via INTRAVENOUS
  Filled 2021-04-23: qty 1

## 2021-04-23 MED ORDER — LABETALOL HCL 5 MG/ML IV SOLN: 10.0000 mg | Freq: Once | INTRAVENOUS | Status: DC

## 2021-04-23 MED ORDER — METOCLOPRAMIDE HCL 5 MG/ML IJ SOLN
5.0000 mg | Freq: Once | INTRAMUSCULAR | Status: AC
  Administered 2021-04-23: 5 mg via INTRAVENOUS
  Filled 2021-04-23: qty 2

## 2021-04-23 MED ORDER — TELMISARTAN 40 MG PO TABS: 40.0000 mg | ORAL_TABLET | Freq: Every day | ORAL | 1 refills | Status: DC

## 2021-04-23 NOTE — Discharge Instructions (Signed)
Please read and follow all provided instructions.  Your diagnoses today include:  1. Acute nonintractable headache, unspecified headache type   2. Primary hypertension     Tests performed today include: CT of your head which was normal and did not show any serious cause of your headache Complete blood cell count: no problems Basic metabolic panel Vital signs. See below for your results today.   Medications:  In the Emergency Department you received: Reglan - antinausea/headache medication Benadryl - antihistamine to counteract potential side effects of reglan  Metoprolol: Medication for blood pressure  Take any prescribed medications only as directed.  Additional information:  Follow any educational materials contained in this packet.  You are having a headache. No specific cause was found today for your headache. It may have been a migraine or other cause of headache. Stress, anxiety, fatigue, and depression are common triggers for headaches.   Your headache today does not appear to be life-threatening or require hospitalization, but often the exact cause of headaches is not determined in the emergency department. Therefore, follow-up with your doctor is very important to find out what may have caused your headache and whether or not you need any further diagnostic testing or treatment.   Sometimes headaches can appear benign (not harmful), but then more serious symptoms can develop which should prompt an immediate re-evaluation by your doctor or the emergency department.  BE VERY CAREFUL not to take multiple medicines containing Tylenol (also called acetaminophen). Doing so can lead to an overdose which can damage your liver and cause liver failure and possibly death.   Follow-up instructions: Please follow-up with your primary care provider in the next 3 days for further evaluation of your symptoms.   Return instructions:  Please return to the Emergency Department if you  experience worsening symptoms. Return if the medications do not resolve your headache, if it recurs, or if you have multiple episodes of vomiting or cannot keep down fluids. Return if you have a change from the usual headache. RETURN IMMEDIATELY IF you: Develop a sudden, severe headache Develop confusion or become poorly responsive or faint Develop a fever above 100.62F or problem breathing Have a change in speech, vision, swallowing, or understanding Develop new weakness, numbness, tingling, incoordination in your arms or legs Have a seizure Please return if you have any other emergent concerns.  Additional Information:  Your vital signs today were: BP (!) 165/77   Pulse (!) 57   Temp 98.4 F (36.9 C) (Oral)   Resp 16   SpO2 98%  If your blood pressure (BP) was elevated above 135/85 this visit, please have this repeated by your doctor within one month. --------------

## 2021-04-23 NOTE — ED Provider Notes (Signed)
Emergency Medicine Provider Triage Evaluation Note  RONIA HAZELETT , a 87 y.o. female  was evaluated in triage.  Pt complains of persistent, chronic left sided headache. Symptoms worsening since onset and patient attributes pain to her BP medication (telmisartan). Reports her BP has been running high with her headaches. Her doctors increased her dosage yesterday to 2 tablets of 40mg ; patient feels her headache has been worse since this medication adjustment. No head trauma, fevers, vision changes or loss, extremity numbness or paresthesias, extremity weakness.  Review of Systems  Positive: As above Negative: As above  Physical Exam  BP (!) 175/99 (BP Location: Left Arm)   Pulse 83   Temp 98.4 F (36.9 C) (Oral)   Resp 18   SpO2 100%  Gen:   Awake, no distress   Resp:  Normal effort  MSK:   Moves extremities without difficulty  Other:  No focal deficits or meningismus appreciated.  Medical Decision Making  Medically screening exam initiated at 6:39 AM.  Appropriate orders placed.  MADYSEN FAIRCLOTH was informed that the remainder of the evaluation will be completed by another provider, this initial triage assessment does not replace that evaluation, and the importance of remaining in the ED until their evaluation is complete.  Left sided headache, chronic   Antonietta Breach, PA-C 04/23/21 0645    Isla Pence, MD 04/23/21 [phone]

## 2021-04-23 NOTE — ED Triage Notes (Signed)
Pt presents with HA for several days.  States she has been consulting her doctor and had changes to dosage of BP meds as her BP is under poor control.  Pt states her headache continues to be constant and has not improved with the medication changes. Family at bedside states pt is "unsteady on her feet" as well.  Neuro exam intact at time of triage.

## 2021-04-23 NOTE — Telephone Encounter (Signed)
Daughter of patient called to state her mother just left the ED and they found out that the medicine called in to pharmacy that was closed. Called medication  into [facility] per request.

## 2021-04-23 NOTE — ED Provider Notes (Signed)
[facility] EMERGENCY DEPARTMENT Provider Note   CSN: [id number] Arrival date & time: 04/23/21  2947     History Chief Complaint  Patient presents with   Headache   Hypertension    Robin [name] is a 87 y.o. female.  Patient with history of hypertension, headache --presents to the emergency department for severe left-sided headache.  Patient states he was just recently started on telmisartan and has correlated the headache to this medication with worsening as the dose was increased.  She describes sharp left-sided head pain.  With her regular headaches, she typically has pain on the left side, however it is never been this severe before.  Pain radiates down to the back of her neck.  No associated vomiting but she has had nausea.  No vision changes or vision loss.  No unilateral weakness, trouble talking or walking.  No change in gait per the patient's daughter at bedside.  She denies fever, trauma, confusion.  The onset of this condition was gradual. The course is worsening. Aggravating factors: none, including no light or sound sensitivity.  Alleviating factors: none.        Past Medical History:  Diagnosis Date   Abnormal Pap smear of cervix    Allergy    Arthritis    Asthma    Cataract 2018   bilateral eyes   Colon polyps    tubulovillous adenoma, 2010   External hemorrhoids without mention of complication    GERD (gastroesophageal reflux disease)    Headache    eval with neuro in 2011   Hyperlipemia    Hyperparathyroidism ([place])    seeing Dr. Dwyane Dee in endocrinology   Hypertension    IBS (irritable bowel syndrome), GERD, hx colon polyps, chronic abd pain - followed by Dr. Sharlett Iles in GI 06/11/2012   Osteoporosis 08/29/2011   Vertigo    eval with neuro in 2011, brief recurrence 2016    Patient Active Problem List   Diagnosis Date Noted   Osteoporosis of femur without pathological fracture 06/24/2019   Dyspnea 02/03/2019   Asthma 02/03/2019    Vertigo 06/26/2017   Hyperparathyroidism ([place]) 08/24/2014   Slow transit constipation 01/13/2014   IBS (irritable bowel syndrome), GERD, hx colon polyps, chronic abd pain - followed by Dr. Sharlett Iles in GI 06/11/2012   Osteopenia 08/29/2011   Vitamin D deficiency 08/29/2011   GERD (gastroesophageal reflux disease) 11/23/2010   Dyslipidemia 09/09/2007   Hypertension, essential, benign 09/09/2007    Past Surgical History:  Procedure Laterality Date   BACK SURGERY  1974   CARPAL TUNNEL RELEASE Left 06/2008   COLONOSCOPY     EYE SURGERY  07/2017   bilateral cataract extraction   POLYPECTOMY     TONSILLECTOMY     UPPER GASTROINTESTINAL ENDOSCOPY       OB History     Gravida  2   Para  2   Term  2   Preterm      AB      Living  2      SAB      IAB      Ectopic      Multiple      Live Births  2           Family History  Problem Relation Age of Onset   Diabetes Brother    Hypertension Brother    Stroke Brother 62   Hypertension Father    Stroke Father    Diabetes Sister  Hypertension Sister    Heart disease Sister        heart failure   CAD Mother        Died of MI at age 1   Hypertension Mother    CAD Brother        Died of MI at age 3   Hypertension Brother    Stroke Sister 37   Hypertension Sister    Stroke Brother 47   Hypertension Brother    Colon cancer Neg Hx    Colon polyps Neg Hx    Esophageal cancer Neg Hx    Pancreatic cancer Neg Hx    Stomach cancer Neg Hx    Rectal cancer Neg Hx     Social History   Tobacco Use   Smoking status: Never   Smokeless tobacco: Never  Vaping Use   Vaping Use: Never used  Substance Use Topics   Alcohol use: No    Alcohol/week: 0.0 standard drinks   Drug use: No    Home Medications Prior to Admission medications   Medication Sig Start Date End Date Taking? Authorizing Provider  acetaminophen (TYLENOL) 500 MG tablet Take 500-1,000 mg by mouth every 6 (six) hours as needed for mild pain  or headache.     [provider]  cetirizine (ZYRTEC) 10 MG tablet Take 10 mg by mouth daily.    [provider]  Cholecalciferol 25 MCG (1000 UT) TBDP Take 1,000 Units by mouth daily. 11/18/18   Renato Shin, MD  esomeprazole (NEXIUM) 40 MG capsule Take 1 capsule (40 mg total) by mouth 2 (two) times daily before a meal. 03/09/21   Lemmon, Lavone Nian, PA  ezetimibe (ZETIA) 10 MG tablet TAKE 1 BY MOUTH DAILY 11/17/20   [name], MD  furosemide (LASIX) 20 MG tablet 1 tablet by mouth every other day as needed for swelling 07/19/20   [name], MD  hydrOXYzine (ATARAX/VISTARIL) 10 MG tablet Take 1-2 tablets (10-20 mg total) by mouth at bedtime as needed for anxiety. 04/06/21   [name], MD  spironolactone (ALDACTONE) 25 MG tablet Take 1 tablet (25 mg total) by mouth 2 (two) times daily. 03/21/21   [name], MD  sucralfate (CARAFATE) 1 g tablet Take 1 tablet (1 g total) by mouth 4 (four) times daily. Take 1 hour before or 2 hours after other medications 04/05/21   Levin Erp, PA  telmisartan (MICARDIS) 40 MG tablet Take 1-2 tablets (40-80 mg total) by mouth daily. 04/20/21   [name], MD    Allergies    Prilosec [omeprazole], Amlodipine, Protonix [pantoprazole sodium], Amoxicillin, and Flagyl [metronidazole]  Review of Systems   Review of Systems  Constitutional:  Negative for chills and fever.  HENT:  Negative for congestion, dental problem, rhinorrhea and sinus pressure.   Eyes:  Negative for photophobia, discharge, redness and visual disturbance.  Respiratory:  Negative for shortness of breath.   Cardiovascular:  Negative for chest pain.  Gastrointestinal:  Positive for nausea. Negative for vomiting.  Musculoskeletal:  Negative for gait problem, neck pain and neck stiffness.  Skin:  Negative for rash.  Neurological:  Positive for headaches. Negative for syncope, speech difficulty, weakness, light-headedness and  numbness.  Psychiatric/Behavioral:  Negative for confusion.    Physical Exam Updated Vital Signs BP (!) 210/84   Pulse 77   Temp 98.4 F (36.9 C) (Oral)   Resp 16   SpO2 98%   Physical Exam Vitals and nursing note  reviewed.  Constitutional:      Appearance: She is well-developed.  HENT:     Head: Normocephalic and atraumatic.     Right Ear: Tympanic membrane, ear canal and external ear normal.     Left Ear: Tympanic membrane, ear canal and external ear normal.     Nose: Nose normal.     Mouth/Throat:     Pharynx: Uvula midline.  Eyes:     General: Lids are normal.     Extraocular Movements:     Right eye: No nystagmus.     Left eye: No nystagmus.     Conjunctiva/sclera: Conjunctivae normal.     Pupils: Pupils are equal, round, and reactive to light.  Cardiovascular:     Rate and Rhythm: Normal rate and regular rhythm.  Pulmonary:     Effort: Pulmonary effort is normal.     Breath sounds: Normal breath sounds.  Abdominal:     Palpations: Abdomen is soft.     Tenderness: There is no abdominal tenderness.  Musculoskeletal:     Cervical back: Normal range of motion and neck supple. No tenderness or bony tenderness.  Skin:    General: Skin is warm and dry.  Neurological:     Mental Status: She is alert and oriented to person, place, and time.     GCS: GCS eye subscore is 4. GCS verbal subscore is 5. GCS motor subscore is 6.     Cranial Nerves: No cranial nerve deficit.     Sensory: No sensory deficit.     Motor: No weakness.     Coordination: Coordination normal.     Comments: Upper extremity myotomes tested bilaterally:  C5 Shoulder abduction 5/5 C6 Elbow flexion/wrist extension 5/5 C7 Elbow extension 5/5 C8 Finger flexion 5/5 T1 Finger abduction 5/5  Lower extremity myotomes tested bilaterally: L2 Hip flexion 5/5 L3 Knee extension 5/5 L4 Ankle dorsiflexion 5/5 S1 Ankle plantar flexion 5/5     ED Results / Procedures / Treatments   Labs (all labs ordered  are listed, but only abnormal results are displayed) Labs Reviewed  COMPREHENSIVE METABOLIC PANEL - Abnormal; Notable for the following components:      Result Value   Calcium 10.8 (*)    All other components within normal limits  CBC WITH DIFFERENTIAL/PLATELET    EKG None  Radiology CT HEAD WO CONTRAST (5MM)  Result Date: 04/23/2021 CLINICAL DATA:  Headache EXAM: CT HEAD WITHOUT CONTRAST TECHNIQUE: Contiguous axial images were obtained from the base of the skull through the vertex without intravenous contrast. COMPARISON:  CT head 12/06/2016 FINDINGS: Brain: No acute intracranial hemorrhage, mass effect, or herniation. No extra-axial fluid collections. No evidence of acute territorial infarct. No hydrocephalus. Extensive patchy hypodensities throughout the periventricular and subcortical white matter similar to previous study, likely secondary to chronic microvascular ischemic changes. Mild cortical volume loss. Vascular: No hyperdense vessel or unexpected calcification. Skull: Normal. Negative for fracture or focal lesion. Sinuses/Orbits: No acute finding. Other: None. IMPRESSION: No acute intracranial process identified. Advanced chronic microvascular ischemic changes. Electronically Signed   By: Ofilia Neas M.D.   On: 04/23/2021 08:18    Procedures Procedures   Medications Ordered in ED Medications  metoCLOPramide (REGLAN) injection 5 mg (5 mg Intravenous Given 04/23/21 1242)  diphenhydrAMINE (BENADRYL) injection 12.5 mg (12.5 mg Intravenous Given 04/23/21 1242)  metoprolol tartrate (LOPRESSOR) injection 5 mg (5 mg Intravenous Given 04/23/21 1242)    ED Course  I have reviewed the triage vital signs and the nursing  notes.  Pertinent labs & imaging results that were available during my care of the patient were reviewed by me and considered in my medical decision making (see chart for details).  Clinical Course as of 04/23/21 1221  Sat Apr 23, 2021  0643 Appears to have had  multiple changes to BP medications lately. Was on labetalol, but had changed to Cozaar and Bystolic at appt with her PCP in August. Changed to Telmisartan at the end of September. [KH]    Clinical Course User Index [KH] Antonietta Breach, PA-C   Patient seen and examined. Work-up initiated. Medications ordered.  Will check labs, treat headache with GI cocktail, IV labetalol.  Did discuss CT angio of the head and neck with patient to evaluate for other possible vascular causes of headache which would not be unreasonable given that her headache is more severe than typical.  Patient would like to try treatments first.  Will reassess.  Vital signs reviewed and are as follows: BP (!) 210/84   Pulse 77   Temp 98.4 F (36.9 C) (Oral)   Resp 16   SpO2 98%   After IV metoprolol (patient declined labetalol), and migraine cocktail --patient reports improvement.  Vital signs improved as below.  Patient tolerating well.  Patient discussed with and seen by Dr. Maryan Rued.  Plan for discharge.  Patient and daughter at bedside are comfortable with this.  She has follow-up with cardiology in 2 days.  We will give 25 mg metoprolol twice daily until she can follow-up.  On reexam, no decompensation clinically.  No focal neurodeficits.  Will defer additional imaging.  BP (!) 165/77   Pulse (!) 57   Temp 98.4 F (36.9 C) (Oral)   Resp 16   SpO2 98%   Patient counseled to return if they have weakness in their arms or legs, slurred speech, trouble walking or talking, confusion, trouble with their balance, or if they have any other concerns. Patient verbalizes understanding and agrees with plan.    MDM Rules/Calculators/A&P                           Patient with headache in setting of hypertension.  She is concerned that new blood pressure medication is contributing to her symptoms.  Labs are reassuring.  Head CT, noncontrast was negative.  Headache more severe than typical headaches.  Otherwise, patient without  high-risk features of headache including: sudden onset/thunderclap HA, altered mental status, accompanying seizure, headache with exertion, history of immunocompromise, neck or shoulder pain, fever, use of anticoagulation, family history of spontaneous SAH, concomitant drug use, toxic exposure.   Patient has a normal complete neurological exam, normal vital signs, normal level of consciousness, no signs of meningismus, is well-appearing/non-toxic appearing, no signs of trauma.   Considered CTA head and neck to evaluate for aneurysm or dissection, however patient clinically improved after treatment and would like to defer additional imaging at this time.  No dangerous or life-threatening conditions suspected or identified by history, physical exam, and by work-up. No indications for hospitalization identified.   Final Clinical Impression(s) / ED Diagnoses Final diagnoses:  Acute nonintractable headache, unspecified headache type  Primary hypertension    Rx / DC Orders ED Discharge Orders          Ordered    metoprolol tartrate (LOPRESSOR) 25 MG tablet  2 times daily        04/23/21 1358  Carlisle Cater, PA-C 04/23/21 1419    Blanchie Dessert, MD 04/24/21 1445

## 2021-04-25 ENCOUNTER — Encounter: Payer: Self-pay | Admitting: Cardiology

## 2021-04-25 ENCOUNTER — Other Ambulatory Visit: Payer: Self-pay

## 2021-04-25 ENCOUNTER — Ambulatory Visit (INDEPENDENT_AMBULATORY_CARE_PROVIDER_SITE_OTHER): Payer: PPO | Admitting: Cardiology

## 2021-04-25 VITALS — BP 152/78 | HR 48 | Ht 59.53 in | Wt 156.4 lb

## 2021-04-25 DIAGNOSIS — E7800 Pure hypercholesterolemia, unspecified: Secondary | ICD-10-CM

## 2021-04-25 DIAGNOSIS — I10 Essential (primary) hypertension: Secondary | ICD-10-CM | POA: Diagnosis not present

## 2021-04-25 DIAGNOSIS — I350 Nonrheumatic aortic (valve) stenosis: Secondary | ICD-10-CM

## 2021-04-25 MED ORDER — HYDRALAZINE HCL 25 MG PO TABS: 25.0000 mg | ORAL_TABLET | Freq: Three times a day (TID) | ORAL | 6 refills | Status: DC

## 2021-04-25 NOTE — Patient Instructions (Signed)
Medication Instructions:   TAKE 1/2 TABLET OF METOPROLOL TWICE DAILY X 2 DAYS AND THEN STOP  START HYDRALAZINE 25 MG ONE TABLET THREE TIMES DAILY  *If you need a refill on your cardiac medications before your next appointment, please call your pharmacy*  Follow-Up: At [facility], you and your health needs are our priority.  As part of our continuing mission to provide you with exceptional heart care, we have created designated Provider Care Teams.  These Care Teams include your primary Cardiologist (physician) and Advanced Practice Providers (APPs -  Physician Assistants and Nurse Practitioners) who all work together to provide you with the care you need, when you need it.  We recommend signing up for the patient portal called "MyChart".  Sign up information is provided on this After Visit Summary.  MyChart is used to connect with patients for Virtual Visits (Telemedicine).  Patients are able to view lab/test results, encounter notes, upcoming appointments, etc.  Non-urgent messages can be sent to your provider as well.   To learn more about what you can do with MyChart, go to [email or web address].    Your next appointment:   3 month(s)  The format for your next appointment:   In Person  Provider:   Kirk Ruths, MD

## 2021-04-26 ENCOUNTER — Encounter: Payer: Self-pay | Admitting: Cardiology

## 2021-04-27 ENCOUNTER — Ambulatory Visit (INDEPENDENT_AMBULATORY_CARE_PROVIDER_SITE_OTHER): Payer: PPO | Admitting: Family Medicine

## 2021-04-27 ENCOUNTER — Encounter: Payer: Self-pay | Admitting: Family Medicine

## 2021-04-27 VITALS — BP 170/64 | HR 58 | Temp 97.7°F | Ht 59.53 in | Wt 156.7 lb

## 2021-04-27 DIAGNOSIS — I10 Essential (primary) hypertension: Secondary | ICD-10-CM

## 2021-04-27 NOTE — Progress Notes (Signed)
Robin [name] DOB: 05-05-1934 Encounter date: 04/27/2021  This is a 87 y.o. female who presents with Chief Complaint  Patient presents with   Follow-up    History of present illness: IN ER over weekend for headache, elevated blood pressure. She felt better after given metoprolol in the hospital, so she was discharged on this as she wished to stop the telmisartan she had been taking. Her heart rate at cardiology on Monday was 48, so metoprolol was tapered to wean off and Dr. [name] started hydralazine 25mg  TID.  She has tried and not tolerated (and doesn't want to re-try): chlorthalidone, clonidine (significant drops in pressure), atenolol. Hydralazine, amlodipine (swelling), lisinopril, candesartan, azilsartan, olmesartan, labetalol (cramping), lasix. She frequently gets headaches with medication as well as stomach discomfort.   She is still weaning off the metoprolol - last tab is tonight.   So far states that she hasn't felt much with the hydralazine. Head has felt better. Has seen as low as 258 systolic at home. This morning was 527 systolic and HR was 53. Took morning dose of hydralazine around 6:30 she thinks. She hasn't been taking the lasix at all. Still taking spironolactone twice daily.   Head hasn't felt right since being on telmisartan. Just has ache that is on left side of head. She really thinks that headache was from medication and not coming from blood pressure. Headache just stuck there and felt like someone was hammering. Did take tylenol yesterday for ache in head.    Allergies  Allergen Reactions   Prilosec [Omeprazole] Cough   Amlodipine     Feet and ankle swelling   Atenolol     Did not help lower bp   Bystolic [Nebivolol Hcl]     Severe stomach pain   Chlorthalidone     Cramps, low back pain and itchy scalp   Clonidine Derivatives     fainting   Losartan     Did not lower bp   Protonix [Pantoprazole Sodium] Swelling   Amoxicillin Rash   Flagyl  [Metronidazole] Rash   Current Meds  Medication Sig   acetaminophen (TYLENOL) 500 MG tablet Take 500-1,000 mg by mouth every 6 (six) hours as needed for mild pain or headache.    cetirizine (ZYRTEC) 10 MG tablet Take 10 mg by mouth daily.   Cholecalciferol 25 MCG (1000 UT) TBDP Take 1,000 Units by mouth daily.   esomeprazole (NEXIUM) 40 MG capsule Take 1 capsule (40 mg total) by mouth 2 (two) times daily before a meal.   ezetimibe (ZETIA) 10 MG tablet TAKE 1 BY MOUTH DAILY   furosemide (LASIX) 20 MG tablet 1 tablet by mouth every other day as needed for swelling   hydrALAZINE (APRESOLINE) 25 MG tablet Take 1 tablet (25 mg total) by mouth 3 (three) times daily.   hydrOXYzine (ATARAX/VISTARIL) 10 MG tablet Take 1-2 tablets (10-20 mg total) by mouth at bedtime as needed for anxiety.   metoprolol tartrate (LOPRESSOR) 25 MG tablet Take 12.5 mg by mouth 2 (two) times daily.   spironolactone (ALDACTONE) 25 MG tablet Take 1 tablet (25 mg total) by mouth 2 (two) times daily.   sucralfate (CARAFATE) 1 g tablet Take 1 tablet (1 g total) by mouth 4 (four) times daily. Take 1 hour before or 2 hours after other medications    Review of Systems  Constitutional:  Negative for chills, fatigue and fever.  Respiratory:  Negative for cough, chest tightness, shortness of breath and wheezing.   Cardiovascular:  Negative  for chest pain, palpitations and leg swelling.   Objective:  BP (!) 170/64    Pulse (!) 58    Temp 97.7 F (36.5 C) (Oral)    Ht 4' 11.53" (1.512 m)    Wt 156 lb 11.2 oz (71.1 kg)    SpO2 97%    BMI 31.09 kg/m   Weight: 156 lb 11.2 oz (71.1 kg)   BP Readings from Last 3 Encounters:  04/27/21 (!) 170/64  04/25/21 (!) 152/78  04/23/21 (!) 165/77   Wt Readings from Last 3 Encounters:  04/27/21 156 lb 11.2 oz (71.1 kg)  04/25/21 156 lb 6.4 oz (70.9 kg)  04/05/21 158 lb 8 oz (71.9 kg)    Physical Exam Constitutional:      General: She is not in acute distress.    Appearance: She is  well-developed.  Cardiovascular:     Rate and Rhythm: Normal rate and regular rhythm.     Heart sounds: Normal heart sounds. No murmur heard.   No friction rub.  Pulmonary:     Effort: Pulmonary effort is normal. No respiratory distress.     Breath sounds: Normal breath sounds. No wheezing or rales.  Musculoskeletal:     Right lower leg: No edema.     Left lower leg: No edema.  Neurological:     Mental Status: She is alert and oriented to person, place, and time.  Psychiatric:        Behavior: Behavior normal.    Assessment/Plan  1. Hypertension, essential, benign She seems to be tolerating the hydralazine at this point. She will continue to check her pressures at home. She is elevated here in the office, but she also had a stressful morning with getting here. She will keep checking today and let me know if staying over 709 systolic. Continue with spironolactone.   Return for keep scheduled follow ups.      Micheline Rough, MD

## 2021-04-27 NOTE — Patient Instructions (Signed)
Keep checking pressures at home.   If you are regularly seeing pressures over 160; please call the office and let us know.   Keep follow ups with maddie, cardio, me so that we have dedicated check in times.

## 2021-04-29 ENCOUNTER — Other Ambulatory Visit: Payer: Self-pay | Admitting: Family Medicine

## 2021-04-29 ENCOUNTER — Encounter: Payer: Self-pay | Admitting: Family Medicine

## 2021-04-29 ENCOUNTER — Telehealth: Payer: Self-pay

## 2021-04-29 MED ORDER — METOPROLOL TARTRATE 25 MG PO TABS: 12.5000 mg | ORAL_TABLET | Freq: Two times a day (BID) | ORAL | 0 refills | Status: DC | PRN

## 2021-04-29 NOTE — Telephone Encounter (Signed)
Daughter of patient called requesting a call back asap because patients heart rate is reading 120 and B/p 143/70 please call patient with advice on what patient should do # [phone]

## 2021-04-30 NOTE — Telephone Encounter (Signed)
Dr. [name] - just wanted you to be aware of elevated heart rate Ms. Overby is having. See note below. For list of prior med intolerances please see either my last office note or office note from our pharmacist, Quentin Cornwall in the event that you would like to change up her medications. She does have a follow up phone visit with Maddie this week. If there is anything I can do to help further evaluate before her follow up with you please let me know!    When I called patient 12/16, she reported that bp have been running 140-160 on the hydralazine TID. But last night felt that heart rate increased and felt like she couldn't breathe. It resolved on its own, but took hours. 12/16 at lunch, she felt like HR was up and it was 110. Then rechecked twice in next hour and was up to 120's. It scared her. No chest pain, pressure. Only sx is that fingers felt prickly. 12/16 evening pressure has been in 166-063 systolic over 01'S diastolic with HR in the 01'U. She states that she feels "weird" and just tired from episode last night, but breathing and chest feel ok now. She has had a headache today. She is worried that the fast heart rate is related to taking hydralazine. She took the last dose of metoprolol on Wednesday as part of her wean off directed by cardiology at last visit.   She is worried about additional episodes over weekend and how to handle these. She is interested in med change, but worries about hydralazine. I advised her to increase her spironolactone (since she seems to tolerate this) to 50mg  BID, and then take 1/2 tab of her hydralazine 25mg  TID through weekend. Although she doesn't want to take the metoprolol I did send in low dose to take 1/4-1/2 tablet if she has episode of faster heart rate. She will update me as needed. Encouraged her to seek evaluation if HR elevates and doesn't return to normal or is associated with other sx like shortness of breath/chest pain.   She had heart monitor about a  year ago.

## 2021-05-02 ENCOUNTER — Telehealth: Payer: Self-pay | Admitting: Pharmacist

## 2021-05-02 NOTE — Chronic Care Management (AMB) (Signed)
° ° °  Chronic Care Management Pharmacy Assistant   Name: Robin [name]  MRN: [id number] DOB: Jan 10, 1934  05/02/21 APPOINTMENT REMINDER   Patient was reminded to have all medications, supplements and any blood glucose and blood pressure readings available for review with Jeni Salles, Pharm. D, for telephone visit on 05/04/21 at 4:30.   Care Gaps: AWV - 7/22 BP 170/64 (04/27/21)  Star Rating Drug: Telmisartan (Micardis) 40 mg - Last filled 04/22/2021 90 DS at [facility]  Any gaps in medications fill history?  None    Medications: Outpatient Encounter Medications as of 05/02/2021  Medication Sig   acetaminophen (TYLENOL) 500 MG tablet Take 500-1,000 mg by mouth every 6 (six) hours as needed for mild pain or headache.    cetirizine (ZYRTEC) 10 MG tablet Take 10 mg by mouth daily.   Cholecalciferol 25 MCG (1000 UT) TBDP Take 1,000 Units by mouth daily.   esomeprazole (NEXIUM) 40 MG capsule Take 1 capsule (40 mg total) by mouth 2 (two) times daily before a meal.   ezetimibe (ZETIA) 10 MG tablet TAKE 1 BY MOUTH DAILY   furosemide (LASIX) 20 MG tablet 1 tablet by mouth every other day as needed for swelling   hydrALAZINE (APRESOLINE) 25 MG tablet Take 1 tablet (25 mg total) by mouth 3 (three) times daily.   hydrOXYzine (ATARAX/VISTARIL) 10 MG tablet Take 1-2 tablets (10-20 mg total) by mouth at bedtime as needed for anxiety.   metoprolol tartrate (LOPRESSOR) 25 MG tablet Take 0.5 tablets (12.5 mg total) by mouth 2 (two) times daily as needed.   spironolactone (ALDACTONE) 25 MG tablet Take 1 tablet (25 mg total) by mouth 2 (two) times daily.   sucralfate (CARAFATE) 1 g tablet Take 1 tablet (1 g total) by mouth 4 (four) times daily. Take 1 hour before or 2 hours after other medications   No facility-administered encounter medications on file as of 05/02/2021.     Bricelyn Clinical Pharmacist Assistant [phone]

## 2021-05-04 ENCOUNTER — Encounter: Payer: Self-pay | Admitting: Podiatry

## 2021-05-04 ENCOUNTER — Ambulatory Visit (INDEPENDENT_AMBULATORY_CARE_PROVIDER_SITE_OTHER): Payer: PPO | Admitting: Pharmacist

## 2021-05-04 ENCOUNTER — Ambulatory Visit: Payer: PPO | Admitting: Podiatry

## 2021-05-04 ENCOUNTER — Other Ambulatory Visit: Payer: Self-pay

## 2021-05-04 DIAGNOSIS — E7800 Pure hypercholesterolemia, unspecified: Secondary | ICD-10-CM

## 2021-05-04 DIAGNOSIS — M722 Plantar fascial fibromatosis: Secondary | ICD-10-CM

## 2021-05-04 DIAGNOSIS — I10 Essential (primary) hypertension: Secondary | ICD-10-CM

## 2021-05-04 MED ORDER — DEXAMETHASONE SODIUM PHOSPHATE 120 MG/30ML IJ SOLN
8.0000 mg | Freq: Once | INTRAMUSCULAR | Status: AC
  Administered 2021-05-04: 11:00:00 8 mg via INTRA_ARTICULAR

## 2021-05-04 NOTE — Patient Instructions (Signed)

## 2021-05-04 NOTE — Progress Notes (Signed)
° °Chronic Care Management °Pharmacy Note ° °05/05/2021 °Name:  Robin [name] MRN:  [id number] DOB:  11/09/1933 ° °Summary: °BP and HR are increasing per home readings °Pt is not feeling well overall and doesn't feel like doing much right now ° °Recommendations/Changes made from today's visit: °-Recommended continued use of metoprolol PRN for pulse > 100 °-Consider decreasing hydralazine to 10 mg TID and starting diltiazem ° °Plan: °Follow up in 2 weeks ° °Subjective: °Robin [name] is an 87 y.o. year old female who is a primary patient of [name], Junell C, MD.  The CCM team was consulted for assistance with disease management and care coordination needs.   ° °Engaged with patient face to face for follow up visit in response to provider referral for pharmacy case management and/or care coordination services.  ° °Consent to Services:  °The patient was given information about Chronic Care Management services, agreed to services, and gave verbal consent prior to initiation of services.  Please see initial visit note for detailed documentation.  ° °Patient Care Team: °[name], Junell C, MD as PCP - General (Family Medicine) °Crenshaw, Brian S, MD as PCP - Cardiology (Cardiology) °Stark, Malcolm T, MD as Consulting Physician (Gastroenterology) °Crenshaw, Brian S, MD as Consulting Physician (Cardiology) °Tanner, Michael, MD as Consulting Physician (Ophthalmology) °Miller, Mary S, MD as Consulting Physician (Gynecology) °Pryor, Madeline G, RPH as Pharmacist (Pharmacist) ° °Recent office visits: °04/29/21 Telephone encounter: Recommended increasing spironolactone to 50 mg BID, 1/2 tab of hydralazine TID and taking metoprolol PRN for elevated heart rate. ° °04/27/21 [name], Junell C, MD - Patient presented for HTN follow up.  ° °04/06/21 [name], Junell C, MD - Patient presented for HTN follow up. Increased telmisartan to 1.5 tablets daily and try hydralazine for anxiety at bedtime. ° °03/28/21 [name], Junell  C, MD - Patient presented for HTN follow up. Recommended follow up with endo for osteoporosis. ° °01/10/21 [name], Junell C, MD - Patient presented for Hypertension, essential, benign. Decreased Nebivolol HCL to 2.5 mg oral daily. °  °12-03-2020 [name], Junell C, MD - Patient presented for Hypertension, essential, benign and other concerns. Stopped Rosuvastatin 5 mg. °  °11-24-2020 Betterson, Tina H, LPN - Patient presented for Medicare Wellness Exam. No medication changes. ° °Recent consult visits: °04/25/21 Crenshaw, Brian S, MD (Cardiology) - Patient presented for HTN follow up. Tapered off metoprolol. Prescribed hydralazine 25 mg TID. ° °04/05/21 Jennifer Lemmon, PA (gastro): Patient presented for abdominal pain follow up. °  °07-29-2020 Dixon, Kelly, NP (GYN) - Patient presented for Lichen sclerosus. No medication changes. ° °Hospital visits: °04/23/21 Patient presented to [facility] ED for headache. Prescribed metoprolol tartrate 25 mg BID. ° ° °Objective: ° °Lab Results  °Component Value Date  ° CREATININE 0.82 04/23/2021  ° BUN 18 04/23/2021  ° GFR 53.38 (L) 03/28/2021  ° GFRNONAA >60 04/23/2021  ° GFRAA 74 03/16/2020  ° NA 138 04/23/2021  ° K 3.6 04/23/2021  ° CALCIUM 10.8 (H) 04/23/2021  ° CO2 25 04/23/2021  ° GLUCOSE 89 04/23/2021  ° ° °Lab Results  °Component Value Date/Time  ° HGBA1C 5.5 09/30/2013 09:28 AM  ° HGBA1C 5.4 06/19/2012 09:19 AM  ° GFR 53.38 (L) 03/28/2021 10:56 AM  ° GFR 64.58 10/04/2020 10:31 AM  °  °Last diabetic Eye exam: No results found for: HMDIABEYEEXA  °Last diabetic Foot exam: No results found for: HMDIABFOOTEX  ° °Lab Results  °Component Value Date  ° CHOL 264 (H) 03/28/2021  ° HDL 65.70 03/28/2021  °   03/28/2021   LDLCALC 172 (H) 03/28/2021   TRIG 130.0 03/28/2021   CHOLHDL 4 03/28/2021    Hepatic Function Latest Ref Rng & Units 04/23/2021 03/28/2021 10/04/2020  Total Protein 6.5 - 8.1 g/dL 6.7 7.3 6.4  Albumin 3.5 - 5.0 g/dL 4.1 4.6 4.2  AST 15 - 41 U/L _0 ALT 0 - 44 U/L _1 Alk Phosphatase 38 - 126 U/L 68 89 73  Total Bilirubin 0.3 - 1.2 mg/dL 0.8 0.6 0.6  Bilirubin, Direct 0.0 - 0.3 mg/dL - - -    Lab Results  Component Value Date/Time   TSH 1.70 07/05/2020 12:33 PM   TSH 1.59 02/02/2020 10:00 AM    CBC Latest Ref Rng & Units 04/23/2021 03/28/2021 10/04/2020  WBC 4.0 - 10.5 K/uL 8.4 6.4 6.0  Hemoglobin 12.0 - 15.0 g/dL 13.0 13.7 11.9(L)  Hematocrit 36.0 - 46.0 % 40.4 41.0 35.1(L)  Platelets 150 - 400 K/uL 309 311.0 259.0    Lab Results  Component Value Date/Time   VD25OH 46.29 03/28/2021 10:56 AM   VD25OH 44.89 07/05/2020 12:33 PM    Clinical ASCVD: No  The ASCVD Risk score (Arnett DK, et al., 2019) failed to calculate for the following reasons:   The 2019 ASCVD risk score is only valid for ages 63 to 34    Depression screen PHQ 2/9 04/27/2021 11/24/2020 11/24/2019  Decreased Interest 0 0 0  Down, Depressed, Hopeless 0 0 0  PHQ - 2 Score 0 0 0  Altered sleeping 0 - 0  Tired, decreased energy 1 - 0  Change in appetite 0 - 0  Feeling bad or failure about yourself  0 - 0  Trouble concentrating 0 - 0  Moving slowly or fidgety/restless 0 - 0  Suicidal thoughts 0 - 0  PHQ-9 Score 1 - 0  Difficult doing work/chores - - Not difficult at all  Some recent data might be hidden     Social History   Tobacco Use  Smoking Status Never  Smokeless Tobacco Never   BP Readings from Last 3 Encounters:  04/27/21 (!) 170/64  04/25/21 (!) 152/78  04/23/21 (!) 165/77   Pulse Readings from Last 3 Encounters:  04/27/21 (!) 58  04/25/21 (!) 48  04/23/21 (!) 57   Wt Readings from Last 3 Encounters:  04/27/21 156 lb 11.2 oz (71.1 kg)  04/25/21 156 lb 6.4 oz (70.9 kg)  04/05/21 158 lb 8 oz (71.9 kg)   BMI Readings from Last 3 Encounters:  04/27/21 31.09 kg/m  04/25/21 31.03 kg/m  04/05/21 31.48 kg/m    Assessment/Interventions: Review of patient past medical history, allergies, medications, health status, including  review of consultants reports, laboratory and other test data, was performed as part of comprehensive evaluation and provision of chronic care management services.   SDOH:  (Social Determinants of Health) assessments and interventions performed: No   SDOH Screenings   Alcohol Screen: Not on file  Depression (PHQ2-9): Low Risk    PHQ-2 Score: 1  Financial Resource Strain: Low Risk    Difficulty of Paying Living Expenses: Not hard at all  Food Insecurity: No Food Insecurity   Worried About Charity fundraiser in the Last Year: Never true   Ran Out of Food in the Last Year: Never true  Housing: Low Risk    Last Housing Risk Score: 0  Physical Activity: Inactive   Days of Exercise per Week: 0 days   Minutes of Exercise per  Session: 0 min  Social Connections: Socially Isolated   Frequency of Communication with Friends and Family: More than three times a week   Frequency of Social Gatherings with Friends and Family: Once a week   Attends Religious Services: Never   Marine scientist or Organizations: No   Attends Archivist Meetings: Never   Marital Status: Widowed  Stress: No Stress Concern Present   Feeling of Stress : Not at all  Tobacco Use: Low Risk    Smoking Tobacco Use: Never   Smokeless Tobacco Use: Never   Passive Exposure: Not on file  Transportation Needs: No Transportation Needs   Lack of Transportation (Medical): No   Lack of Transportation (Non-Medical): No   CCM Care Plan  Allergies  Allergen Reactions   Prilosec [Omeprazole] Cough   Amlodipine     Feet and ankle swelling   Atenolol     Did not help lower bp   Bystolic [Nebivolol Hcl]     Severe stomach pain   Chlorthalidone     Cramps, low back pain and itchy scalp   Clonidine Derivatives     fainting   Losartan     Did not lower bp   Protonix [Pantoprazole Sodium] Swelling   Amoxicillin Rash   Flagyl [Metronidazole] Rash    Medications Reviewed Today     Reviewed by Lorenda Peck, MD (Physician) on 05/04/21 at 62  Med List Status: <None>   Medication Order Taking? Sig Documenting Provider Last Dose Status Informant  acetaminophen (TYLENOL) 500 MG tablet [id number] No Take 500-1,000 mg by mouth every 6 (six) hours as needed for mild pain or headache.  [provider] Taking Active Self  cetirizine (ZYRTEC) 10 MG tablet [id number] No Take 10 mg by mouth daily. [provider] Taking Active Self  Cholecalciferol 25 MCG (1000 UT) TBDP [id number] No Take 1,000 Units by mouth daily. Renato Shin, MD Taking Active Self  dexamethasone (DECADRON) injection 8 mg [id number]   Lorenda Peck, MD  Expired 05/04/21 1030   esomeprazole (NEXIUM) 40 MG capsule [id number] No Take 1 capsule (40 mg total) by mouth 2 (two) times daily before a meal. Levin Erp, PA Taking Active   ezetimibe (ZETIA) 10 MG tablet [id number] No TAKE 1 BY MOUTH DAILY [name], Junell C, MD Taking Active   furosemide (LASIX) 20 MG tablet [id number] No 1 tablet by mouth every other day as needed for swelling [name], MD Taking Active   hydrALAZINE (APRESOLINE) 25 MG tablet [id number] No Take 1 tablet (25 mg total) by mouth 3 (three) times daily. [name], MD Taking Active   hydrOXYzine (ATARAX/VISTARIL) 10 MG tablet [id number] No Take 1-2 tablets (10-20 mg total) by mouth at bedtime as needed for anxiety. [name], MD Taking Active   metoprolol tartrate (LOPRESSOR) 25 MG tablet [id number]  Take 0.5 tablets (12.5 mg total) by mouth 2 (two) times daily as needed. [name], MD  Active   spironolactone (ALDACTONE) 25 MG tablet [id number] No Take 1 tablet (25 mg total) by mouth 2 (two) times daily. [name], MD Taking Active   sucralfate (CARAFATE) 1 g tablet [id number] No Take 1 tablet (1 g total) by mouth 4 (four) times daily. Take 1 hour before or 2 hours after other medications Levin Erp, PA Taking Active              Patient Active Problem List   Diagnosis Date Noted   Osteoporosis  fracture 06/24/2019  ° Dyspnea 02/03/2019  ° Asthma 02/03/2019  ° Vertigo 06/26/2017  ° Hyperparathyroidism (HCC) 08/24/2014  ° Slow transit constipation 01/13/2014  ° IBS (irritable bowel syndrome), GERD, hx colon polyps, chronic abd pain - followed by Dr. Patterson in GI 06/11/2012  ° Osteopenia 08/29/2011  ° Vitamin D deficiency 08/29/2011  ° GERD (gastroesophageal reflux disease) 11/23/2010  ° Dyslipidemia 09/09/2007  ° Hypertension, essential, benign 09/09/2007  ° ° °Immunization History  °Administered Date(s) Administered  ° Fluad Quad(high Dose 65+) 03/04/2019, 02/02/2020, 03/28/2021  ° Influenza, High Dose Seasonal PF 03/23/2015, 02/22/2016, 02/13/2017, 03/11/2018  ° Influenza,inj,Quad PF,6+ Mos 03/07/2013, 03/03/2014  ° PFIZER(Purple Top)SARS-COV-2 Vaccination 06/21/2019, 07/12/2019, 03/30/2020, 12/03/2020  ° Pneumococcal Conjugate-13 08/10/2014  ° Pneumococcal Polysaccharide-23 07/23/2012  ° Tdap 07/23/2012  ° Zoster, Live 09/30/2013  ° °Conditions to be addressed/monitored:  °Hypertension, Hyperlipidemia, GERD, Asthma, and Osteoporosis ° °Conditions addressed this visit: °Hypertension, Hyperlipidemia ° °Care Plan : CCM Pharmacy Care Plan  °Updates made by Pryor, Madeline G, RPH since 05/05/2021 12:00 AM  °  ° °Problem: Problem: Hypertension, Hyperlipidemia, GERD, Asthma, and Osteoporosis   °  ° °Long-Range Goal: Patient-Specific Goal   °Start Date: 01/24/2021  °Expected End Date: 01/24/2021  °Recent Progress: On track  °Priority: High  °Note:   °Current Barriers:  °Unable to achieve control of blood pressure  ° °Pharmacist Clinical Goal(s):  °Patient will achieve control of blood pressure as evidenced by home and office readings  through collaboration with PharmD and provider.  ° °Interventions: °1:1 collaboration with [name], Junell C, MD regarding development and update of comprehensive plan of care  as evidenced by provider attestation and co-signature °Inter-disciplinary care team collaboration (see longitudinal plan of care) °Comprehensive medication review performed; medication list updated in electronic medical record ° °Hypertension (BP goal <140/90) °-Uncontrolled °-Current treatment: °Spironolactone 50 mg 1 tablet twice daily  °Hydralazine 25 1/2 tablet three times daily °Metoprolol 25 mg 1/4 tablet as needed for HR > 100 °Furosemide 20 mg 1 tablet as needed °-Medications previously tried: chlorthalidone, clonidine, atenolol, hydralazine, amlodipine (swelling), lisinopril, candesartan, azilsartan, olmesartan (change to combo), labetalol (cramping), telmisartan (headaches) °-Current home readings: 179/80 (today) °17th morning - 137/62 pulse 74, 156/69 pulse 72 °18th - 142/66 pulse 86, evening 148/75 pulse 73 °19th - 151/66 pulse 94, evening 155/72 pulse 74, 8pm: 172/76 pulse 68 °20th - 169/67 pulse 68, evening 173/68 pulse 70 °-Current dietary habits: working on reading package labels °-Current exercise habits: does not routinely exercise °-Reports hypotensive/hypertensive symptoms °-Educated on Daily salt intake goal < 2300 mg; °Exercise goal of 150 minutes per week; °Importance of home blood pressure monitoring; °Proper BP monitoring technique; °Symptoms of hypotension and importance of maintaining adequate hydration; °-Counseled to monitor BP at home daily, document, and provide log at future appointments °-Counseled on diet and exercise extensively °Recommended to continue current medication ° °Fluid retention (Goal: minimize swelling) °-Controlled °-Current treatment  °Furosemide 20 mg 1 tablet as needed °-Medications previously tried: none  °-Recommended to continue current medication ° ° °Hyperlipidemia: (LDL goal < 100) °-Controlled °-Current treatment: °Zetia 10 mg 1 tablet daily (holding) °-Medications previously tried: rosuvastatin (cramping)  °-Current dietary patterns: eats out some °-Current  exercise habits: does not routinely exercise °-Educated on Cholesterol goals;  °Benefits of statin for ASCVD risk reduction; °Exercise goal of 150 minutes per week; °-Recommended resuming Zetia or rosuvastatin depending on lipid panel results. ° °Osteopenia (Goal prevent fractures) °-Uncontrolled °-Last DEXA Scan: 03/18/2019  ° T-Score femoral neck: -2.6 ° T-Score total   2.6  T-Score total hip: n/a  T-Score lumbar spine: -1.0  T-Score forearm radius: -2.7  10-year probability of major osteoporotic fracture: n/a  10-year probability of hip fracture: n/a -Patient is a candidate for pharmacologic treatment due to T-Score < -2.5 in femoral neck -Current treatment  Vitamin D 1000 units daily -Medications previously tried: none  -Recommend [phone] units of vitamin D daily. Recommend weight-bearing and muscle strengthening exercises for building and maintaining bone density. -Counseled on diet and exercise extensively Reassess at follow up.  GERD (Goal: minimize symptoms) -Controlled -Current treatment  Esomeprazole 40 mg 1 capsule twice daily before meals -Medications previously tried: none  -Counseled on non-pharmacologic management of symptoms such as elevating the head of your bed, avoiding eating 2-3 hours before bed, avoiding triggering foods such as acidic, spicy, or fatty foods, eating smaller meals, and wearing clothes that are loose around the waist Patient reports her triggers include tomatoes and spicy foods.  Health Maintenance -Vaccine gaps: shingrix, influenza -Current therapy:  Cetirizine 10 mg 1 tablet daily (took when dizziness started) - vertigo Vitamin B12 - 1000 mcg - sublingual Acetaminophen 500 mg as needed -Educated on Cost vs benefit of each product must be carefully weighed by individual consumer -Patient is satisfied with current therapy and denies issues -Recommended to continue current medication  Patient Goals/Self-Care Activities Patient will:  - take medications as  prescribed check blood pressure daily, document, and provide at future appointments target a minimum of 150 minutes of moderate intensity exercise weekly engage in dietary modifications by limiting salt intake  Follow Up Plan: The care management team will reach out to the patient again over the next 30 days.          Medication Assistance: None required.  Patient affirms current coverage meets needs.  Compliance/Adherence/Medication fill history: Care Gaps: BP 170/64 (04/27/21)  Star-Rating Drugs: Telmisartan (Micardis) 40 mg - Last filled 04/22/2021 90 DS at [facility]  Patient's preferred pharmacy is:  [facility] [place] - [address] [place] [address] Phone: [phone] Fax: [phone]  [facility] [place], [place] [address] Phone: [phone] Fax: [phone]  [facility]) - [place], [place] [address] Phone: [phone] Fax: [phone]   Uses pill box? Yes Pt endorses 100% compliance  We discussed: Current pharmacy is preferred with insurance plan and patient is satisfied with pharmacy services Patient decided to: Continue current medication management strategy  Care Plan and Follow Up Patient Decision:  Patient agrees to Care Plan and Follow-up.  Plan: The care management team will reach out to the patient again over the next 30 days.  Jeni Salles, PharmD, [place] Pharmacist [place] at [place]

## 2021-05-04 NOTE — Progress Notes (Signed)
°  Subjective:  Patient ID: Robin [name], female    DOB: 01/26/1934,   MRN: [id number]  Chief Complaint  Patient presents with   Plantar Fasciitis    Bilateral heel pain.     87 y.o. female presents for follow-up of bilateral foot pain. She has seen Dr. Paulla Dolly in the past for this problem. Relates on Saturday morning she took a first step and has pain since then and it has not improved. Has been diagnosed with plantar fasciitis and possible neuropathy. She is requesting injections today. She is not diabetic.  Marland Kitchen Denies any other pedal complaints. Denies n/v/f/c.   Past Medical History:  Diagnosis Date   Abnormal Pap smear of cervix    Allergy    Arthritis    Asthma    Cataract 2018   bilateral eyes   Colon polyps    tubulovillous adenoma, 2010   External hemorrhoids without mention of complication    GERD (gastroesophageal reflux disease)    Headache    eval with neuro in 2011   Hyperlipemia    Hyperparathyroidism ([facility])    seeing Dr. Dwyane Dee in endocrinology   Hypertension    IBS (irritable bowel syndrome), GERD, hx colon polyps, chronic abd pain - followed by Dr. Sharlett Iles in GI 06/11/2012   Osteoporosis 08/29/2011   Vertigo    eval with neuro in 2011, brief recurrence 2016    Objective:  Physical Exam: Vascular: DP/PT pulses 2/4 bilateral. CFT <3 seconds. Normal hair growth on digits. No edema.  Skin. No lacerations or abrasions bilateral feet.  Musculoskeletal: MMT 5/5 bilateral lower extremities in DF, PF, Inversion and Eversion. Deceased ROM in DF of ankle joint. Tender to medial calcaneal tubercle bilateral. No pain along achilles or along medial arch. No pain along PT or peroneal tendons. No tenderness to plantar or dorsal forefoot.  Neurological: Sensation intact to light touch.   Assessment:   1. Plantar fasciitis of left foot   2. Plantar fasciitis of right foot      Plan:  Patient was evaluated and treated and all questions answered. Discussed plantar  fasciitis with patient.  X-rays reviewed and discussed with patient. No acute fractures or dislocations noted. Mild spurring noted at inferior calcaneus.  Discussed treatment options including, ice, NSAIDS, supportive shoes, bracing, and stretching. Stretching exercises provided to be done on a daily basis.   Patient requesting injection today. Procedure note below.   Follow-up 6 weeks or sooner if any problems arise. In the meantime, encouraged to call the office with any questions, concerns, change in symptoms.   Procedure:  Discussed etiology, pathology, conservative vs. surgical therapies. At this time a plantar fascial injection was recommended.  The patient agreed and a sterile skin prep was applied.  An injection consisting of  dexamethasone and marcaine mixture was infiltrated at the point of maximal tenderness on the bilateral Heel.  Bandaid applied. The patient tolerated this well and was given instructions for aftercare.    Lorenda Peck, DPM

## 2021-05-05 NOTE — Patient Instructions (Signed)
Hi Mayetta,  I hope you feel better soon! I will let you know what Dr. Ethlyn Gallery says as soon as I hear back.  Please reach out to me if you have any questions or need anything before then!  Best, Maddie  Jeni Salles, PharmD, [place] Pharmacist [place] [facility] [place]

## 2021-05-06 ENCOUNTER — Encounter: Payer: Self-pay | Admitting: Family Medicine

## 2021-05-06 ENCOUNTER — Other Ambulatory Visit: Payer: Self-pay | Admitting: Family Medicine

## 2021-05-06 MED ORDER — DILTIAZEM HCL ER COATED BEADS 120 MG PO CP24: 120.0000 mg | ORAL_CAPSULE | Freq: Every day | ORAL | 2 refills | Status: DC

## 2021-05-12 ENCOUNTER — Telehealth: Payer: Self-pay | Admitting: Physician Assistant

## 2021-05-12 NOTE — Telephone Encounter (Signed)
Patient called and stated that pharmacy would not refill a medication that Anderson Malta proscribed due to it being rejected.Patient did not know the name but stated that it was a liquid. Seeking advice, please advise.

## 2021-05-13 ENCOUNTER — Encounter: Payer: Self-pay | Admitting: Family Medicine

## 2021-05-13 ENCOUNTER — Telehealth: Payer: Self-pay | Admitting: Family Medicine

## 2021-05-13 MED ORDER — AMBULATORY NON FORMULARY MEDICATION: 1 refills | Status: DC

## 2021-05-13 NOTE — Telephone Encounter (Signed)
Pt is calling and was told to let md know how the  diltiazem (CARDIZEM CD) 120 MG 24 hr capsule  is working . Pt has sent mychart message with her BP readings. Please advise

## 2021-05-13 NOTE — Telephone Encounter (Signed)
Patient requested a refill on her GI cocktail. Informed patient will refill it for her. Faxed refill to [facility].

## 2021-05-13 NOTE — Telephone Encounter (Signed)
Mychart message sent.

## 2021-05-14 ENCOUNTER — Other Ambulatory Visit: Payer: Self-pay | Admitting: Family Medicine

## 2021-05-14 DIAGNOSIS — I10 Essential (primary) hypertension: Secondary | ICD-10-CM | POA: Diagnosis not present

## 2021-05-14 DIAGNOSIS — E7800 Pure hypercholesterolemia, unspecified: Secondary | ICD-10-CM

## 2021-05-14 DIAGNOSIS — I499 Cardiac arrhythmia, unspecified: Secondary | ICD-10-CM

## 2021-05-17 ENCOUNTER — Ambulatory Visit (INDEPENDENT_AMBULATORY_CARE_PROVIDER_SITE_OTHER): Payer: PPO

## 2021-05-17 ENCOUNTER — Telehealth: Payer: Self-pay | Admitting: Cardiology

## 2021-05-17 ENCOUNTER — Other Ambulatory Visit: Payer: Self-pay | Admitting: Family Medicine

## 2021-05-17 DIAGNOSIS — I499 Cardiac arrhythmia, unspecified: Secondary | ICD-10-CM

## 2021-05-17 DIAGNOSIS — R002 Palpitations: Secondary | ICD-10-CM

## 2021-05-17 NOTE — Telephone Encounter (Signed)
Patient c/o Palpitations:  High priority if patient c/o lightheadedness, shortness of breath, or chest pain  How long have you had palpitations/irregular HR/ Afib? Are you having the symptoms now?  Patient states for the past 1 week she has had a rapid HR. She states right now it feels irregular, as if it is skipping a beat, but it is not rapid.  Are you currently experiencing lightheadedness, SOB or CP?  No   Do you have a history of afib (atrial fibrillation) or irregular heart rhythm?  Yes   Have you checked your BP or HR? (document readings if available):  No   Are you experiencing any other symptoms?  No

## 2021-05-17 NOTE — Telephone Encounter (Signed)
Called patient, she states she has been having increased palpitations (feeling like her heart is skipping a beat) her HR this morning was 86, BP was 164/86 she has not yet taken her medications. Patient denies chest pains, shortness of breath, feeling lightheaded, from chart review PCP ordered heart monitor (it was just sent out today for her to wear for 14 days) patient aware this is coming to her. Her PCP had mentioned to her to be seen sooner than the appointment in March. Patient states she would like to know if Dr.Crenshaw would agree to this, we did discuss waiting until the monitor returns, as she states this would be okay with her. However would route to MD to review.

## 2021-05-17 NOTE — Progress Notes (Unsigned)
Enrolled patient for a 14 day Zio XT monitor to be mailed to patients home   Dr [name] to read

## 2021-05-17 NOTE — Telephone Encounter (Signed)
Called patient, advised of needed 6-8 week appointment. Scheduled with Coletta Memos, NP on 06/30/2021.   Patient aware, verbalized understanding.

## 2021-05-19 DIAGNOSIS — R002 Palpitations: Secondary | ICD-10-CM

## 2021-05-19 DIAGNOSIS — I499 Cardiac arrhythmia, unspecified: Secondary | ICD-10-CM

## 2021-06-06 DIAGNOSIS — I499 Cardiac arrhythmia, unspecified: Secondary | ICD-10-CM | POA: Diagnosis not present

## 2021-06-06 DIAGNOSIS — R002 Palpitations: Secondary | ICD-10-CM | POA: Diagnosis not present

## 2021-06-07 ENCOUNTER — Other Ambulatory Visit: Payer: Self-pay

## 2021-06-07 NOTE — Telephone Encounter (Signed)
Saw [name], NP 07/29/2020.  "Plan: Continue to use clobetasol twice weekly. Pt states she still has enough of the Rx written by Dr. Sabra Heck and will call when refill is needed   May use vaseline on days that she does not use clobetasol   Advised to call if she has a flare that is not relieved by clobetasol   F/u annually for evaluation of lichen sclerosis."   *I have sent message to appointment desk to call her and arrange annual follow up visit for this.

## 2021-06-10 DIAGNOSIS — Z23 Encounter for immunization: Secondary | ICD-10-CM | POA: Diagnosis not present

## 2021-06-10 MED ORDER — CLOBETASOL PROPIONATE 0.05 % EX OINT: TOPICAL_OINTMENT | CUTANEOUS | 4 refills | Status: DC

## 2021-06-13 ENCOUNTER — Encounter: Payer: Self-pay | Admitting: Podiatry

## 2021-06-13 ENCOUNTER — Other Ambulatory Visit: Payer: Self-pay

## 2021-06-13 ENCOUNTER — Ambulatory Visit: Payer: PPO | Admitting: Podiatry

## 2021-06-13 DIAGNOSIS — G629 Polyneuropathy, unspecified: Secondary | ICD-10-CM

## 2021-06-13 DIAGNOSIS — M722 Plantar fascial fibromatosis: Secondary | ICD-10-CM | POA: Diagnosis not present

## 2021-06-13 MED ORDER — CAPSAICIN 0.035 % EX CREA: TOPICAL_CREAM | CUTANEOUS | 0 refills | Status: DC

## 2021-06-13 MED ORDER — DEXAMETHASONE SODIUM PHOSPHATE 120 MG/30ML IJ SOLN
8.0000 mg | Freq: Once | INTRAMUSCULAR | Status: AC
  Administered 2021-06-13: 8 mg via INTRA_ARTICULAR

## 2021-06-13 NOTE — Progress Notes (Signed)
°  Subjective:  Patient ID: Robin [name], female    DOB: 1934/03/21,   MRN: [id number]  Chief Complaint  Patient presents with   Plantar Fasciitis    Pt is here to f/u BIL plantar fasciitis     88 y.o. female presents for follow-up of bilateral plantar fasciitis and neuropathy in feet. Relates she is doing about the same. The injections did help but they did not last. States she has not been stretching as she didn't have the instructions. Relates burning and tingling in the ball of her foot and was hoping to get something to help with the neuropathy but does not like to take medicine.  . Denies any other pedal complaints. Denies n/v/f/c.   Past Medical History:  Diagnosis Date   Abnormal Pap smear of cervix    Allergy    Arthritis    Asthma    Cataract 2018   bilateral eyes   Colon polyps    tubulovillous adenoma, 2010   External hemorrhoids without mention of complication    GERD (gastroesophageal reflux disease)    Headache    eval with neuro in 2011   Hyperlipemia    Hyperparathyroidism ([place])    seeing Dr. Dwyane Dee in endocrinology   Hypertension    IBS (irritable bowel syndrome), GERD, hx colon polyps, chronic abd pain - followed by Dr. Sharlett Iles in GI 06/11/2012   Osteoporosis 08/29/2011   Vertigo    eval with neuro in 2011, brief recurrence 2016    Objective:  Physical Exam: Vascular: DP/PT pulses 2/4 bilateral. CFT <3 seconds. Normal hair growth on digits. No edema.  Skin. No lacerations or abrasions bilateral feet.  Musculoskeletal: MMT 5/5 bilateral lower extremities in DF, PF, Inversion and Eversion. Deceased ROM in DF of ankle joint. Tender to medial calcaneal tubercle bilateral. No pain along achilles or along medial arch. No pain along PT or peroneal tendons. No tenderness to plantar or dorsal forefoot.  Neurological: Sensation intact to light touch.   Assessment:   1. Plantar fasciitis of left foot   2. Plantar fasciitis of right foot   3. Neuropathy       Plan:  Patient was evaluated and treated and all questions answered. Discussed plantar fasciitis with patient.  X-rays reviewed and discussed with patient. No acute fractures or dislocations noted. Mild spurring noted at inferior calcaneus.  Discussed treatment options including, ice, NSAIDS, supportive shoes, bracing, and stretching. Stretching exercises provided to be done on a daily basis.   Patient requesting injection today. Procedure note below.   Dispensed prescirption for capsaicin for neuropathy symptoms.  Follow-up 6 weeks or sooner if any problems arise. In the meantime, encouraged to call the office with any questions, concerns, change in symptoms.   Procedure:  Discussed etiology, pathology, conservative vs. surgical therapies. At this time a plantar fascial injection was recommended.  The patient agreed and a sterile skin prep was applied.  An injection consisting of  dexamethasone and marcaine mixture was infiltrated at the point of maximal tenderness on the bilateral Heel.  Bandaid applied. The patient tolerated this well and was given instructions for aftercare.    Lorenda Peck, DPM

## 2021-06-13 NOTE — Patient Instructions (Signed)

## 2021-06-29 NOTE — Progress Notes (Signed)
Cardiology Clinic Note   Patient Name: Robin [name] Date of Encounter: 06/30/2021  Primary Care Provider:  [name], MD Primary Cardiologist:  Kirk Ruths, MD  Patient Profile    Robin [name] 88 year old female presents the clinic today for follow-up evaluation of her essential hypertension and aortic stenosis.  Past Medical History    Past Medical History:  Diagnosis Date   Abnormal Pap smear of cervix    Allergy    Arthritis    Asthma    Cataract 2018   bilateral eyes   Colon polyps    tubulovillous adenoma, 2010   External hemorrhoids without mention of complication    GERD (gastroesophageal reflux disease)    Headache    eval with neuro in 2011   Hyperlipemia    Hyperparathyroidism ([place])    seeing Dr. Dwyane Dee in endocrinology   Hypertension    IBS (irritable bowel syndrome), GERD, hx colon polyps, chronic abd pain - followed by Dr. Sharlett Iles in GI 06/11/2012   Osteoporosis 08/29/2011   Vertigo    eval with neuro in 2011, brief recurrence 2016   Past Surgical History:  Procedure Laterality Date   BACK SURGERY  1974   CARPAL TUNNEL RELEASE Left 06/2008   COLONOSCOPY     EYE SURGERY  07/2017   bilateral cataract extraction   POLYPECTOMY     TONSILLECTOMY     UPPER GASTROINTESTINAL ENDOSCOPY      Allergies  Allergies  Allergen Reactions   Prilosec [Omeprazole] Cough   Amlodipine     Feet and ankle swelling   Atenolol     Did not help lower bp   Bystolic [Nebivolol Hcl]     Severe stomach pain   Chlorthalidone     Cramps, low back pain and itchy scalp   Clonidine Derivatives     fainting   Losartan     Did not lower bp   Protonix [Pantoprazole Sodium] Swelling   Amoxicillin Rash   Flagyl [Metronidazole] Rash    History of Present Illness    Robin [name] has a PMH of HTN, aortic stenosis, HLD, asthma, GERD, IBS, hyperparathyroidism, osteopenia, and vertigo.  She was seen in follow-up by Dr. [name] on 04/25/2021.  She had  previously reported dizziness.  Her ABIs 8/10 were normal.  Her stress echo 2/14 was normal.  Her MRI 7/18 showed chronic small vessel ischemic changes in her cerebral white matter and pons.  MRA without significant vertebrobasilar disease.  Her CTA showed normal circle of Willis.  EEG also was normal.  She was noted to have chronic mild dizziness.  CTA 9/20 showed no pulmonary embolus.  There was note of coronary artery disease/aortic atherosclerosis.  Her cardiac event monitor 10/21 showed sinus rhythm with PACs, PVCs, brief PAT.  She was also noted to have 6 beats of nonsustained ventricular tachycardia.  Echocardiogram 4/22 showed normal LV function, mild mitral regurgitation, mild aortic stenosis.  During follow-up she reported a visit to the emergency department the previous weekend due to elevated blood pressure.  She was placed on metoprolol.  She denied dyspnea, chest pain and syncope.  She reported that her blood pressure continued to run high.  She was unable to tolerate multiple medications in the past.  Due to bradycardia she was weaned off of her metoprolol and placed on hydralazine 25 mg 3 times daily.  She presents to the clinic today for follow-up evaluation states she does not like the way that diltiazem makes  her feel.  We reviewed her cardiac event monitor and she expressed understanding.  She presents with her daughter who also expresses understanding.  She reports that she has not been taking her hydralazine as scheduled she has been taking it as needed.  She is also been taking her metoprolol as needed.  We reviewed secondary causes for hypertension.  She reports that she has a long family history of high blood pressure and has tried several medications.  I will give her the salty 6 diet sheet, have her take hydralazine 12.5 mg twice daily, start carvedilol 3.125 mg twice daily, avoid secondary causes for hypertension, and follow-up with Dr. [name] as scheduled.  We also reviewed the  importance of maintaining p.o. hydration.  Today she denies chest pain, shortness of breath, lower extremity edema, fatigue, palpitations, melena, hematuria, hemoptysis, diaphoresis, weakness, presyncope, syncope, orthopnea, and PND.       Home Medications    Prior to Admission medications   Medication Sig Start Date End Date Taking? Authorizing Provider  acetaminophen (TYLENOL) 500 MG tablet Take 500-1,000 mg by mouth every 6 (six) hours as needed for mild pain or headache.     [provider]  AMBULATORY NON FORMULARY MEDICATION Medication Name: GI Cocktail - 90 ml 2% Lidocaine, 90 ml Dicyclomine 10mg /22ml, 270 ml Maalox - Take 5-10 ml every 6 hour as needed. 05/13/21   Levin Erp, PA  Capsaicin 0.035 % CREA Apply to affected areas daily with use of gloves. 06/13/21   Lorenda Peck, MD  cetirizine (ZYRTEC) 10 MG tablet Take 10 mg by mouth daily.    [provider]  Cholecalciferol 25 MCG (1000 UT) TBDP Take 1,000 Units by mouth daily. 11/18/18   Renato Shin, MD  clobetasol ointment (TEMOVATE) 0.05 % Apply to affected area twice weekly. 06/10/21   Princess Bruins, MD  diltiazem (CARDIZEM CD) 120 MG 24 hr capsule Take 1 capsule (120 mg total) by mouth daily. 05/06/21   [name], MD  esomeprazole (NEXIUM) 40 MG capsule Take 1 capsule (40 mg total) by mouth 2 (two) times daily before a meal. 03/09/21   Lemmon, Lavone Nian, PA  ezetimibe (ZETIA) 10 MG tablet TAKE 1 BY MOUTH DAILY 11/17/20   [name], MD  furosemide (LASIX) 20 MG tablet 1 tablet by mouth every other day as needed for swelling 07/19/20   [name], MD  hydrALAZINE (APRESOLINE) 25 MG tablet Take 1 tablet (25 mg total) by mouth 3 (three) times daily. 04/25/21 07/24/21  [name], MD  hydrOXYzine (ATARAX/VISTARIL) 10 MG tablet Take 1-2 tablets (10-20 mg total) by mouth at bedtime as needed for anxiety. 04/06/21   [name], MD  metoprolol tartrate  (LOPRESSOR) 25 MG tablet Take 0.5 tablets (12.5 mg total) by mouth 2 (two) times daily as needed. 04/29/21   [name], MD  spironolactone (ALDACTONE) 25 MG tablet Take 1 tablet (25 mg total) by mouth 2 (two) times daily. 03/21/21   [name], MD  sucralfate (CARAFATE) 1 g tablet Take 1 tablet (1 g total) by mouth 4 (four) times daily. Take 1 hour before or 2 hours after other medications 04/05/21   Levin Erp, PA    Family History    Family History  Problem Relation Age of Onset   Diabetes Brother    Hypertension Brother    Stroke Brother 11   Hypertension Father    Stroke Father    Diabetes Sister  Hypertension Sister    Heart disease Sister        heart failure   CAD Mother        Died of MI at age 35   Hypertension Mother    CAD Brother        Died of MI at age 66   Hypertension Brother    Stroke Sister 59   Hypertension Sister    Stroke Brother 46   Hypertension Brother    Colon cancer Neg Hx    Colon polyps Neg Hx    Esophageal cancer Neg Hx    Pancreatic cancer Neg Hx    Stomach cancer Neg Hx    Rectal cancer Neg Hx    She indicated that her mother is deceased. She indicated that her father is deceased. She indicated that both of her sisters are deceased. She indicated that all of her three brothers are deceased. She indicated that her maternal grandmother is deceased. She indicated that her maternal grandfather is deceased. She indicated that her paternal grandmother is deceased. She indicated that her paternal grandfather is deceased. She indicated that the status of her neg hx is unknown.  Social History    Social History   Socioeconomic History   Marital status: Widowed    Spouse name: Not on file   Number of children: 2   Years of education: Not on file   Highest education level: Not on file  Occupational History   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  Tobacco Use   Smoking status: Never    Smokeless tobacco: Never  Vaping Use   Vaping Use: Never used  Substance and Sexual Activity   Alcohol use: No    Alcohol/week: 0.0 standard drinks   Drug use: No   Sexual activity: Not Currently    Birth control/protection: Post-menopausal  Other Topics Concern   Not on file  Social History Narrative   Lives alone in a one story home.  Has 2 children, one son and one daughter with 2 grandkids and 1 great-grandchild.    Retired from working for a bank.     Education: high school.    Social Determinants of Health   Financial Resource Strain: Low Risk    Difficulty of Paying Living Expenses: Not hard at all  Food Insecurity: No Food Insecurity   Worried About Charity fundraiser in the Last Year: Never true   [place] in the Last Year: Never true  Transportation Needs: No Transportation Needs   Lack of Transportation (Medical): No   Lack of Transportation (Non-Medical): No  Physical Activity: Inactive   Days of Exercise per Week: 0 days   Minutes of Exercise per Session: 0 min  Stress: No Stress Concern Present   Feeling of Stress : Not at all  Social Connections: Socially Isolated   Frequency of Communication with Friends and Family: More than three times a week   Frequency of Social Gatherings with Friends and Family: Once a week   Attends Religious Services: Never   Marine scientist or Organizations: No   Attends Archivist Meetings: Never   Marital Status: Widowed  Human resources officer Violence: Not At Risk   Fear of Current or Ex-Partner: No   Emotionally Abused: No   Physically Abused: No   Sexually Abused: No     Review of Systems    General:  No chills, fever, night sweats or  weight changes.  Cardiovascular:  No chest pain, dyspnea on exertion, edema, orthopnea, palpitations, paroxysmal nocturnal dyspnea. Dermatological: No rash, lesions/masses Respiratory: No cough, dyspnea Urologic: No hematuria, dysuria Abdominal:   No nausea, vomiting,  diarrhea, bright red blood per rectum, melena, or hematemesis Neurologic:  No visual changes, wkns, changes in mental status. All other systems reviewed and are otherwise negative except as noted above.  Physical Exam    VS:  BP (!) 160/84    Pulse 66    Ht 5' (1.524 m)    Wt 155 lb 6.4 oz (70.5 kg)    SpO2 91%    BMI 30.35 kg/m  , BMI Body mass index is 30.35 kg/m. GEN: Well nourished, well developed, in no acute distress. HEENT: normal. Neck: Supple, no JVD, carotid bruits, or masses. Cardiac: RRR, no murmurs, rubs, or gallops. No clubbing, cyanosis, edema.  Radials/DP/PT 2+ and equal bilaterally.  Respiratory:  Respirations regular and unlabored, clear to auscultation bilaterally. GI: Soft, nontender, nondistended, BS + x 4. MS: no deformity or atrophy. Skin: warm and dry, no rash. Neuro:  Strength and sensation are intact. Psych: Normal affect.  Accessory Clinical Findings    Recent Labs: 07/05/2020: TSH 1.70 04/23/2021: ALT 13; BUN 18; Creatinine, Ser 0.82; Hemoglobin 13.0; Platelets 309; Potassium 3.6; Sodium 138   Recent Lipid Panel    Component Value Date/Time   CHOL 264 (H) 03/28/2021 1056   TRIG 130.0 03/28/2021 1056   HDL 65.70 03/28/2021 1056   CHOLHDL 4 03/28/2021 1056   VLDL 26.0 03/28/2021 1056   LDLCALC 172 (H) 03/28/2021 1056    ECG personally reviewed by me today-none today.  Echocardiogram 08/16/2020  IMPRESSIONS     1. Left ventricular ejection fraction, by estimation, is 60 to 65%. The  left ventricle has normal function. The left ventricle has no regional  wall motion abnormalities. Left ventricular diastolic parameters are  indeterminate.   2. Right ventricular systolic function is normal. The right ventricular  size is normal. There is normal pulmonary artery systolic pressure.   3. The mitral valve is normal in structure. Mild mitral valve  regurgitation.   4. The aortic valve is tricuspid. Aortic valve regurgitation is not  visualized.    Comparison(s): The left ventricular function is unchanged.   Assessment & Plan   1.  Essential hypertension-BP today 160/84.  Slightly better controlled at home in the 140s-150s over 80s.  Metoprolol discontinued due to bradycardia.  Recently started on diltiazem by her PCP.  She does not like the way that the medications are making her feel.  We reviewed her blood pressure medications and secondary causes of hypertension.  She has had high blood pressure for as long as she can remember.  She reports that she is going to ask her PCP to stop diltiazem.  She also has only been taking hydralazine as needed.  She wishes to review plan with PCP before starting new medication. Continue  furosemide, hydralazine, spironolactone Start hydralazine 12.5 mg twice daily Start carvedilol 3.125 mg twice daily Heart healthy low-sodium diet-salty 6 given Increase physical activity as tolerated Maintain blood pressure log  Coronary artery disease-denies chest pain.  CTA showed coronary calcification Continue ezetimibe Heart healthy low-sodium diet-salty 6 given Increase physical activity as tolerated  Hyperlipidemia-LDL 172 03/28/2021 Continue ezetimibe Heart healthy low-sodium high-fiber diet Increase physical activity as tolerated  Aortic stenosis-previously noted to be mild.  Echocardiogram 08/16/2020 showed aortic valve was not well visualized.  No increased DOE or activity intolerance  Repeat echocardiogram clinically indicated.  Dizziness-chronic in nature.  Stable.  No plans for further evaluation at this time. Maintain p.o. hydration. Removed from seated position to standing slowly.  History of syncopal episodes-previously felt to be related to vasovagal involvement.  Echocardiogram showed normal LV function.  Previously discussed possibility of implantation of loop recorder if recurrent episodes  Disposition: Follow-up with Dr. [name] as scheduled.  Jossie Ng. Ladye Macnaughton NP-C    06/30/2021,  4:44 PM [facility] [address] Office [phone] Fax [phone]  Notice: This dictation was prepared with Dragon dictation along with smaller phrase technology. Any transcriptional errors that result from this process are unintentional and may not be corrected upon review.  I spent 15 minutes examining this patient, reviewing medications, and using patient centered shared decision making involving her cardiac care.  Prior to her visit I spent greater than 20 minutes reviewing her past medical history,  medications, and prior cardiac tests.

## 2021-06-30 ENCOUNTER — Other Ambulatory Visit: Payer: Self-pay

## 2021-06-30 ENCOUNTER — Ambulatory Visit: Payer: PPO | Admitting: General Practice

## 2021-06-30 ENCOUNTER — Encounter: Payer: Self-pay | Admitting: General Practice

## 2021-06-30 VITALS — BP 160/84 | HR 66 | Ht 60.0 in | Wt 155.4 lb

## 2021-06-30 DIAGNOSIS — R55 Syncope and collapse: Secondary | ICD-10-CM | POA: Diagnosis not present

## 2021-06-30 DIAGNOSIS — I10 Essential (primary) hypertension: Secondary | ICD-10-CM | POA: Diagnosis not present

## 2021-06-30 DIAGNOSIS — I2510 Atherosclerotic heart disease of native coronary artery without angina pectoris: Secondary | ICD-10-CM

## 2021-06-30 DIAGNOSIS — I350 Nonrheumatic aortic (valve) stenosis: Secondary | ICD-10-CM | POA: Diagnosis not present

## 2021-06-30 DIAGNOSIS — E7800 Pure hypercholesterolemia, unspecified: Secondary | ICD-10-CM

## 2021-06-30 MED ORDER — CARVEDILOL 3.125 MG PO TABS: 3.1250 mg | ORAL_TABLET | Freq: Every day | ORAL | 6 refills | Status: DC

## 2021-06-30 MED ORDER — HYDRALAZINE HCL 25 MG PO TABS: 12.5000 mg | ORAL_TABLET | Freq: Two times a day (BID) | ORAL | 6 refills | Status: DC

## 2021-06-30 NOTE — Patient Instructions (Signed)
Medication Instructions:  STOP METOPROLOL  START HYDRALAZINE 12.5MG  (1/2 TAB) TWICE DAILY  START CARVEDILOL 3.125MG  DAILY   *If you need a refill on your cardiac medications before your next appointment, please call your pharmacy*  Lab Work:   Testing/Procedures:  NONE    NONE  Special Instructions PLEASE READ AND FOLLOW SALTY 6-ATTACHED-1,800mg  daily  MAKE SURE TO KEEP YOUR PRIMARY APPOINTMENT  Please try to avoid these triggers: Do not use  steroids, stimulants, antidepressants, weight loss medications, immune suppressants, NSAIDs, alcohol, caffeine, licorice, ginseng, St. John's wort Eat heart-healthy foods. Talk with your doctor about the right eating plan for you. Exercise regularly as told by your doctor. Stay hydrated Do not drink alcohol, Caffeine or chocolate. Lose weight if you are overweight. Do not use drugs, including cannabis   Follow-Up: Your next appointment:  KEEP SCHEDULED  APPOINTMENT  In Person with Kirk Ruths, MD:1  At [facility], you and your health needs are our priority.  As part of our continuing mission to provide you with exceptional heart care, we have created designated Provider Care Teams.  These Care Teams include your primary Cardiologist (physician) and Advanced Practice Providers (APPs -  Physician Assistants and Nurse Practitioners) who all work together to provide you with the care you need, when you need it.            6 SALTY THINGS TO AVOID     1,800MG  DAILY

## 2021-06-30 NOTE — Addendum Note (Signed)
Addended by: Waylan Rocher on: 06/30/2021 04:48 PM   Modules accepted: Orders

## 2021-07-04 ENCOUNTER — Ambulatory Visit (INDEPENDENT_AMBULATORY_CARE_PROVIDER_SITE_OTHER): Payer: PPO | Admitting: Family Medicine

## 2021-07-04 ENCOUNTER — Encounter: Payer: Self-pay | Admitting: Family Medicine

## 2021-07-04 VITALS — BP 162/88 | HR 87 | Temp 97.6°F | Ht 60.0 in | Wt 154.2 lb

## 2021-07-04 DIAGNOSIS — K219 Gastro-esophageal reflux disease without esophagitis: Secondary | ICD-10-CM

## 2021-07-04 DIAGNOSIS — Z789 Other specified health status: Secondary | ICD-10-CM

## 2021-07-04 DIAGNOSIS — K5900 Constipation, unspecified: Secondary | ICD-10-CM | POA: Diagnosis not present

## 2021-07-04 DIAGNOSIS — I10 Essential (primary) hypertension: Secondary | ICD-10-CM

## 2021-07-04 DIAGNOSIS — R350 Frequency of micturition: Secondary | ICD-10-CM

## 2021-07-04 MED ORDER — FAMOTIDINE 40 MG PO TABS: 40.0000 mg | ORAL_TABLET | Freq: Two times a day (BID) | ORAL | 0 refills | Status: DC

## 2021-07-04 NOTE — Progress Notes (Signed)
Robin [name] DOB: 11/18/1933 Encounter date: 07/04/2021  This is a 88 y.o. female who presents with blood pressure follow up  History of present illness: Metoprolol discontinued by cardiology (but she wasn't taking regularly); takes lasix just as needed. Suggested carvedilol and hydralazine. She has had significant heart racing with hydralazine at higher doses. She wants to stop the cardizem. Has racing heart at night. Bp numbers looking much better in 130-145 range over 65-90 generally. See scan.   Stomach for her has not been right since trying medication. Feels raw inside of stomach. She hasn't been taking GI cocktail that she was given regularly, but it does feel better when she takes this. Has had constipation lately. She is taking nexium 40mg  BID. Takes gaviscon.   Overall she feels ok. Has a little feeling of off balance, which is pretty stable for her.   Allergies  Allergen Reactions   Prilosec [Omeprazole] Cough   Amlodipine     Feet and ankle swelling   Atenolol     Did not help lower bp   Bystolic [Nebivolol Hcl]     Severe stomach pain   Chlorthalidone     Cramps, low back pain and itchy scalp   Clonidine Derivatives     fainting   Losartan     Did not lower bp   Protonix [Pantoprazole Sodium] Swelling   Amoxicillin Rash   Flagyl [Metronidazole] Rash   Current Meds  Medication Sig   acetaminophen (TYLENOL) 500 MG tablet Take 500-1,000 mg by mouth every 6 (six) hours as needed for mild pain or headache.    AMBULATORY NON FORMULARY MEDICATION Medication Name: GI Cocktail - 90 ml 2% Lidocaine, 90 ml Dicyclomine 10mg /7ml, 270 ml Maalox - Take 5-10 ml every 6 hour as needed.   carvedilol (COREG) 3.125 MG tablet Take 1 tablet (3.125 mg total) by mouth daily.   cetirizine (ZYRTEC) 10 MG tablet Take 10 mg by mouth daily.   Cholecalciferol 25 MCG (1000 UT) TBDP Take 1,000 Units by mouth daily.   clobetasol ointment (TEMOVATE) 0.05 % Apply to affected area twice weekly.    esomeprazole (NEXIUM) 40 MG capsule Take 1 capsule (40 mg total) by mouth 2 (two) times daily before a meal.   ezetimibe (ZETIA) 10 MG tablet TAKE 1 BY MOUTH DAILY   famotidine (PEPCID) 40 MG tablet Take 1 tablet (40 mg total) by mouth 2 (two) times daily. Take morning and bedtime; continue with nexium.   furosemide (LASIX) 20 MG tablet 1 tablet by mouth every other day as needed for swelling   hydrALAZINE (APRESOLINE) 25 MG tablet Take 0.5 tablets (12.5 mg total) by mouth 2 (two) times daily.   spironolactone (ALDACTONE) 25 MG tablet Take 1 tablet (25 mg total) by mouth 2 (two) times daily.   [DISCONTINUED] diltiazem (CARDIZEM CD) 120 MG 24 hr capsule Take 1 capsule (120 mg total) by mouth daily.   [DISCONTINUED] hydrOXYzine (ATARAX/VISTARIL) 10 MG tablet Take 1-2 tablets (10-20 mg total) by mouth at bedtime as needed for anxiety.    Review of Systems  Constitutional:  Negative for chills, fatigue and fever.  Respiratory:  Negative for cough, chest tightness, shortness of breath and wheezing.   Cardiovascular:  Negative for chest pain, palpitations and leg swelling.  Gastrointestinal:        Continues to have stomach discomfort. Felt better when taking GI meds regularly, but stopped doing this. Pain started with addition of bp med months ago.    Objective:  Pulse  87    Temp 97.6 F (36.4 C) (Oral)    Ht 5' (1.524 m)    Wt 154 lb 3.2 oz (69.9 kg)    SpO2 97%    BMI 30.12 kg/m   Weight: 154 lb 3.2 oz (69.9 kg)   BP Readings from Last 3 Encounters:  06/30/21 (!) 160/84  04/27/21 (!) 170/64  04/25/21 (!) 152/78   Wt Readings from Last 3 Encounters:  07/04/21 154 lb 3.2 oz (69.9 kg)  06/30/21 155 lb 6.4 oz (70.5 kg)  04/27/21 156 lb 11.2 oz (71.1 kg)    Physical Exam Constitutional:      General: She is not in acute distress.    Appearance: She is well-developed.  Cardiovascular:     Rate and Rhythm: Normal rate and regular rhythm.     Heart sounds: Murmur heard.  Systolic murmur  is present with a grade of 2/6.    No friction rub.  Pulmonary:     Effort: Pulmonary effort is normal. No respiratory distress.     Breath sounds: Normal breath sounds. No wheezing or rales.  Abdominal:     General: Abdomen is flat. Bowel sounds are normal.     Palpations: Abdomen is soft.  Musculoskeletal:     Right lower leg: No edema.     Left lower leg: No edema.  Neurological:     Mental Status: She is alert and oriented to person, place, and time.  Psychiatric:        Behavior: Behavior normal.    Assessment/Plan  1. Gastroesophageal reflux disease, unspecified whether esophagitis present Patient did not follow-up after previous GI visit.  In previous GI note, it was stated that Pepcid would be added if patient was not having improvement in abdominal symptoms.  I am going to go ahead and send this in for her today and have instructed her to continue with her Nexium. - famotidine (PEPCID) 40 MG tablet; Take 1 tablet (40 mg total) by mouth 2 (two) times daily. Take morning and bedtime; continue with nexium.  Dispense: 60 tablet; Refill: 0  2. Urinary frequency Increased frequency.  We will check urine to make sure no infection.  May be related to constipation. - Urinalysis with Reflex Microscopic - Urine Culture; Future  3. Constipation, unspecified constipation type MiraLAX typically works for her within a single dose, but it has not been recently.  Instructed her to go ahead and take 2 doses today and then once daily until she is having complete resolution of constipation.  Call for follow-up with GI.  4. Hypertension, essential, benign Blood pressure is looking much better today.  These are the best home numbers she has had in a long time, unfortunately she is not happy with the Cardizem and wants to change medications.  I have encouraged her to go ahead and try cardiology suggestion of the carvedilol and continue with her hydralazine 12.5 mg twice daily, and spironolactone 25  mg twice daily.  She will let me know how blood pressures look with this and how she feels with the medication.  Her biggest concern right now is irregular heart rates that she feels at night.  Hoping that the carvedilol helps with this.  5. Medication intolerance See prior physician note/pharmacy note for prior intolerances.  Working through finding treatment plan that controls blood pressure and minimize the side effects.   Return in about 3 months (around 10/01/2021) for Chronic condition visit.    Micheline Rough, MD

## 2021-07-04 NOTE — Patient Instructions (Addendum)
Ok to stop the diltiazem. Start the carvedilol as directed. Let me know how you do with this.   Call GI to set up follow up. In the meanwhile, try adding the pepcid.

## 2021-07-06 ENCOUNTER — Telehealth: Payer: Self-pay | Admitting: Pharmacist

## 2021-07-06 NOTE — Chronic Care Management (AMB) (Signed)
Chronic Care Management Pharmacy Assistant   Name: Robin [name]  MRN: [id number] DOB: 1934/01/01  Reason for Encounter: Disease State General Assessment   Conditions to be addressed/monitored: HTN  Recent office visits:  07/04/21 [name], MD - Patient presented for Gastroesophageal reflux disease and other concerns. Prescribed Famotidine 40 mg. Stopped Diltiazem and Hydroxyzine.  Recent consult visits:  06/30/21 Deberah Pelton, NP (Cardiology) - Patient presented for Essential hypertension and other concerns. Prescribed Carvedilol, Changed Hydralazine, Stopped Capsaicin, Metoprolol and Sucralfate.  06/13/21 Lorenda Peck, MD (Podiatry) - Patient presented for Plantar fasciitis of left foot and other concerns. Prescribed Capsaicin. Hospital visits:  Medication Reconciliation was completed by comparing discharge summary, patients EMR and Pharmacy list, and upon discussion with patient.  Patient presented to [facility] ED on 04/23/21 due to Headache and other concerns. Patient was present for 7 hours.  New?Medications Started at [facility] Discharge:?? -started  metoprolol tartrate 25 MG  Medication Changes at Hospital Discharge: -Changed   Medications Discontinued at Hospital Discharge: -Stopped   Medications that remain the same after Hospital Discharge:??  -All other medications will remain the same.    Medications: Outpatient Encounter Medications as of 07/06/2021  Medication Sig   acetaminophen (TYLENOL) 500 MG tablet Take 500-1,000 mg by mouth every 6 (six) hours as needed for mild pain or headache.    AMBULATORY NON FORMULARY MEDICATION Medication Name: GI Cocktail - 90 ml 2% Lidocaine, 90 ml Dicyclomine 10mg /47ml, 270 ml Maalox - Take 5-10 ml every 6 hour as needed.   carvedilol (COREG) 3.125 MG tablet Take 1 tablet (3.125 mg total) by mouth daily.   cetirizine (ZYRTEC) 10 MG tablet Take 10 mg by mouth daily.   Cholecalciferol 25 MCG  (1000 UT) TBDP Take 1,000 Units by mouth daily.   clobetasol ointment (TEMOVATE) 0.05 % Apply to affected area twice weekly.   esomeprazole (NEXIUM) 40 MG capsule Take 1 capsule (40 mg total) by mouth 2 (two) times daily before a meal.   ezetimibe (ZETIA) 10 MG tablet TAKE 1 BY MOUTH DAILY   famotidine (PEPCID) 40 MG tablet Take 1 tablet (40 mg total) by mouth 2 (two) times daily. Take morning and bedtime; continue with nexium.   furosemide (LASIX) 20 MG tablet 1 tablet by mouth every other day as needed for swelling   hydrALAZINE (APRESOLINE) 25 MG tablet Take 0.5 tablets (12.5 mg total) by mouth 2 (two) times daily.   spironolactone (ALDACTONE) 25 MG tablet Take 1 tablet (25 mg total) by mouth 2 (two) times daily.   No facility-administered encounter medications on file as of 07/06/2021.  Reviewed chart prior to disease state call. Spoke with patient regarding BP  Recent Office Vitals: BP Readings from Last 3 Encounters:  07/04/21 (!) 162/88  06/30/21 (!) 160/84  04/27/21 (!) 170/64   Pulse Readings from Last 3 Encounters:  07/04/21 87  06/30/21 66  04/27/21 (!) 58    Wt Readings from Last 3 Encounters:  07/04/21 154 lb 3.2 oz (69.9 kg)  06/30/21 155 lb 6.4 oz (70.5 kg)  04/27/21 156 lb 11.2 oz (71.1 kg)     Kidney Function Lab Results  Component Value Date/Time   CREATININE 0.82 04/23/2021 12:21 PM   CREATININE 0.97 03/28/2021 10:56 AM   CREATININE 0.91 (H) 02/02/2020 10:00 AM   GFR 53.38 (L) 03/28/2021 10:56 AM   GFRNONAA >60 04/23/2021 12:21 PM   GFRAA 74 03/16/2020 11:52 AM    BMP Latest Ref Rng &  Units 04/23/2021 03/28/2021 03/28/2021  Glucose 70 - 99 mg/dL 89 - 82  BUN 8 - 23 mg/dL 18 - 18  Creatinine 0.44 - 1.00 mg/dL 0.82 - 0.97  BUN/Creat Ratio 12 - 28 - - -  Sodium 135 - 145 mmol/L 138 - 140  Potassium 3.5 - 5.1 mmol/L 3.6 - 4.7  Chloride 98 - 111 mmol/L 106 - 102  CO2 22 - 32 mmol/L 25 - 28  Calcium 8.9 - 10.3 mg/dL 10.8(H) 11.5(H) 11.6(H)    Current  antihypertensive regimen:  Spironolactone 50 mg 1 tablet twice daily  Hydralazine 25 1/2 tablet three times daily Furosemide 20 mg 1 tablet as needed Carvedilol 3.125 mg 1 tablet daily How often are you checking your Blood Pressure? daily Current home BP readings: 141/73 p 56 What recent interventions/DTPs have been made by any provider to improve Blood Pressure control since last CPP Visit:  Patient reports she has just begun using carvedilol very recently. Any recent hospitalizations or ED visits since last visit with CPP? none Patient advised she has given a detailed list of her readings on her last office visit and is hoping her being on this new medication will help her pressures she will continue to check daily. Advised I would be checking back in with her next month to see where her numbers are trending and she was in agreement. Adherence Review: Is the patient currently on ACE/ARB medication? No Does the patient have >5 day gap between last estimated fill dates? No   Care Gaps: BP- 162/88 ( 07/04/21) AWV- 7/22   Star Rating Drugs: None    Ned Clines CMA Clinical Pharmacist Assistant [phone]

## 2021-07-07 ENCOUNTER — Encounter: Payer: Self-pay | Admitting: Family Medicine

## 2021-07-12 NOTE — Telephone Encounter (Signed)
Called and spoke with patient. I offered her an appt later this week with a different APP, but she kindly declined. She would like to keep her appt as scheduled with Anderson Malta. Pt is aware that if she has any worsening symptoms in the interim to reach out to her PCP or be evaluated at an urgent care. Pt verbalized understanding and had no other concerns at the end of the call.

## 2021-07-18 ENCOUNTER — Other Ambulatory Visit: Payer: Self-pay

## 2021-07-18 ENCOUNTER — Telehealth: Payer: Self-pay | Admitting: General Practice

## 2021-07-18 MED ORDER — HYDRALAZINE HCL 25 MG PO TABS: 12.5000 mg | ORAL_TABLET | Freq: Two times a day (BID) | ORAL | 11 refills | Status: DC

## 2021-07-18 NOTE — Telephone Encounter (Signed)
LMTCB

## 2021-07-18 NOTE — Telephone Encounter (Signed)
Thank you for letting me know. We will review her medications with her during her upcoming appt. I agree with refilling her hydralazine. JC  ?

## 2021-07-18 NOTE — Telephone Encounter (Signed)
Spoke with patient about hydralazine 12.5 mg twice daily prescription. She stated she was only given 15 tablets and is running out. I refilled it. I then asked about her carvedilol dose. She stated she is only taking 3.125 mg daily instead of the ordered twice daily dose.  Yesterday 10:56 PB 135/69, P 62. Last pm 8:05 BP 141 /70. P 67. While on the phone, BP 168/90, P 100 and one minute later, BP 153/87, P 93. She stated, "I am not in favor of taking the second dose.  My blood pressure is fine. It just goes up when I get excited." I explained that from her readings, it may be a good idea to try the second dose of carvedilol. She is "against it." She wanted her practitioners know. I let her know that I will inform J. Cleaver, FNP and Dr. [name] so they know she is only taking carvedilol 3.125 mg daily and not twice daily. She thanked me. ?

## 2021-07-18 NOTE — Telephone Encounter (Signed)
New Message: ? ? ? ? ?Patient wants to know if Robin [name] wants her to continue taking Hydralazine? She is asking this, because only 15 pills were called in.Marland Kitchen  ?

## 2021-07-18 NOTE — Telephone Encounter (Signed)
Patient informed that Thomasene Mohair, FNP will discuss medications with her at next appointment. Recommended she take all of her medications to her appointment. She thanked me for helping her with refill of hydralazine. ?

## 2021-07-21 ENCOUNTER — Other Ambulatory Visit: Payer: Self-pay | Admitting: Family Medicine

## 2021-07-21 ENCOUNTER — Telehealth: Payer: Self-pay | Admitting: *Deleted

## 2021-07-21 MED ORDER — ROSUVASTATIN CALCIUM 5 MG PO TABS: 5.0000 mg | ORAL_TABLET | Freq: Every day | ORAL | 3 refills | Status: DC

## 2021-07-21 NOTE — Telephone Encounter (Signed)
[facility] pharmacy faxed a refill request for Rosuvastatin '5mg'$ .  Message sent to PCP as Rx is not on the patient's medication list. ?

## 2021-07-25 ENCOUNTER — Encounter: Payer: Self-pay | Admitting: Physician Assistant

## 2021-07-25 ENCOUNTER — Ambulatory Visit: Payer: PPO | Admitting: Physician Assistant

## 2021-07-25 VITALS — BP 118/84 | HR 72 | Ht 59.0 in | Wt 154.6 lb

## 2021-07-25 DIAGNOSIS — R1013 Epigastric pain: Secondary | ICD-10-CM | POA: Diagnosis not present

## 2021-07-25 DIAGNOSIS — K5900 Constipation, unspecified: Secondary | ICD-10-CM

## 2021-07-25 DIAGNOSIS — K219 Gastro-esophageal reflux disease without esophagitis: Secondary | ICD-10-CM

## 2021-07-25 MED ORDER — FAMOTIDINE 40 MG PO TABS: 40.0000 mg | ORAL_TABLET | Freq: Two times a day (BID) | ORAL | 3 refills | Status: DC

## 2021-07-25 MED ORDER — DEXLANSOPRAZOLE 60 MG PO CPDR: 60.0000 mg | DELAYED_RELEASE_CAPSULE | Freq: Every day | ORAL | 3 refills | Status: DC

## 2021-07-25 NOTE — Progress Notes (Signed)
Chief Complaint: Epigastric abdominal pain  HPI:    Robin [name] is an 88 year old female with a past medical history as listed below, known to Dr. Fuller Plan, who returns to clinic today for follow-up of her epigastric abdominal pain.     Patient seen multiple times in the past for epigastric abdominal pain, see those notes.     07/09/2020 office visit and was accompanied by her daughter.  Described that her constipation is under control but she continued with epigastric issues.  Felt like something is in her throat constantly regardless of using her Nexium 40 mg twice daily.  At that time patient scheduled for an EGD with dilation.  She is continued on Nexium 40 mg twice daily.      07/16/2020 EGD for dysphagia and globus sensation with benign-appearing esophageal stenosis dilated to 17 mm, 3 gastric polyps which were biopsied and small hiatal hernia.  Pathology showed fundic gland polyps.    08/09/2020 patient called and was having some "stomach pain", she was requesting the GI cocktail.      03/28/2021 patient saw PCP and they discussed that since she took her first dose of Bystolic her stomach had been a mess and had not let up since.  She was taking her Nexium twice daily, Gaviscon and GI cocktail.    04/05/2021 patient seen in clinic and described that she has started Bystolic 5 mg nondistended and immediately started epigastric discomfort and pain.  They decreased her dose and then stopped it completely due to ongoing abdominal discomfort.  She continued with a 7/10 discomfort which she could not really describe associated with nausea 90% of the time.  At that time was taking her Nexium 40 twice a day and using Gaviscon as needed as well as GI cocktail.  At that time prescribed Carafate 1 g 4 times daily.  Encouraged her to follow with PCP in regards to hypertension.  Discussed adding Pepcid if this did not work.    Today, the patient tells me that she is still battling with epigastric pain which is  constantly there along with increase in eructations.  Tells me she tried the Carafate 4 times daily but this never did anything for her.  Her PCP started her on Pepcid 40 mg twice a day back in February after reading my recommendations in her last visit note.  Tells me this has not changed anything either.  She is uncomfortable just to the touch.  Eating seems to bother her symptoms as well.  Again blames this on her Bystolic.  She has since switched medicines and her blood pressure is under better control.  Patient very aggravated with ongoing symptoms.  Tells me she has been on Nexium 40 twice a day for "ever".    Also discusses some constipation issues she has been having, these have since resolved with MiraLAX which initially gave her diarrhea using it twice a day, she has since backed off and things seem fairly normal.    Denies fever, chills, weight loss, blood in her stool or symptoms that awaken her from sleep.  Past Medical History:  Diagnosis Date   Abnormal Pap smear of cervix    Allergy    Arthritis    Asthma    Cataract 2018   bilateral eyes   Colon polyps    tubulovillous adenoma, 2010   External hemorrhoids without mention of complication    GERD (gastroesophageal reflux disease)    Headache    eval with neuro in  2011   Hyperlipemia    Hyperparathyroidism ([place])    seeing Dr. Dwyane Dee in endocrinology   Hypertension    IBS (irritable bowel syndrome), GERD, hx colon polyps, chronic abd pain - followed by Dr. Sharlett Iles in GI 06/11/2012   Osteoporosis 08/29/2011   Vertigo    eval with neuro in 2011, brief recurrence 2016    Past Surgical History:  Procedure Laterality Date   BACK SURGERY  1974   CARPAL TUNNEL RELEASE Left 06/2008   COLONOSCOPY     EYE SURGERY  07/2017   bilateral cataract extraction   POLYPECTOMY     TONSILLECTOMY     UPPER GASTROINTESTINAL ENDOSCOPY      Current Outpatient Medications  Medication Sig Dispense Refill   acetaminophen (TYLENOL) 500 MG  tablet Take 500-1,000 mg by mouth every 6 (six) hours as needed for mild pain or headache.      AMBULATORY NON FORMULARY MEDICATION Medication Name: GI Cocktail - 90 ml 2% Lidocaine, 90 ml Dicyclomine '10mg'$ /63m, 270 ml Maalox - Take 5-10 ml every 6 hour as needed. 450 mL 1   carvedilol (COREG) 3.125 MG tablet Take 1 tablet (3.125 mg total) by mouth daily. 30 tablet 6   cetirizine (ZYRTEC) 10 MG tablet Take 10 mg by mouth daily.     Cholecalciferol 25 MCG (1000 UT) TBDP Take 1,000 Units by mouth daily. 90 tablet 3   clobetasol ointment (TEMOVATE) 0.05 % Apply to affected area twice weekly. 60 g 4   esomeprazole (NEXIUM) 40 MG capsule Take 1 capsule (40 mg total) by mouth 2 (two) times daily before a meal. 180 capsule 0   ezetimibe (ZETIA) 10 MG tablet TAKE 1 BY MOUTH DAILY 90 tablet 1   famotidine (PEPCID) 40 MG tablet Take 1 tablet (40 mg total) by mouth 2 (two) times daily. Take morning and bedtime; continue with nexium. 60 tablet 0   furosemide (LASIX) 20 MG tablet 1 tablet by mouth every other day as needed for swelling 45 tablet 3   hydrALAZINE (APRESOLINE) 25 MG tablet Take 0.5 tablets (12.5 mg total) by mouth 2 (two) times daily. 30 tablet 11   rosuvastatin (CRESTOR) 5 MG tablet Take 1 tablet (5 mg total) by mouth daily. 90 tablet 3   spironolactone (ALDACTONE) 25 MG tablet Take 1 tablet (25 mg total) by mouth 2 (two) times daily. 180 tablet 2   No current facility-administered medications for this visit.    Allergies as of 07/25/2021 - Review Complete 07/04/2021  Allergen Reaction Noted   Prilosec [omeprazole] Cough 08/16/2016   Amlodipine  02/12/2019   Atenolol  138/45/3646  Bystolic [nebivolol hcl]  04/26/2021   Chlorthalidone  04/26/2021   Clonidine derivatives  04/26/2021   Losartan  04/26/2021   Protonix [pantoprazole sodium] Swelling 03/14/2016   Amoxicillin Rash 04/28/2009   Flagyl [metronidazole] Rash 04/28/2009    Family History  Problem Relation Age of Onset    Diabetes Brother    Hypertension Brother    Stroke Brother 656  Hypertension Father    Stroke Father    Diabetes Sister    Hypertension Sister    Heart disease Sister        heart failure   CAD Mother        Died of MI at age 86  Hypertension Mother    CAD Brother        Died of MI at age 86  Hypertension Brother    Stroke Sister 852  Hypertension Sister    Stroke Brother 48   Hypertension Brother    Colon cancer Neg Hx    Colon polyps Neg Hx    Esophageal cancer Neg Hx    Pancreatic cancer Neg Hx    Stomach cancer Neg Hx    Rectal cancer Neg Hx     Social History   Socioeconomic History   Marital status: Widowed    Spouse name: Not on file   Number of children: 2   Years of education: Not on file   Highest education level: Not on file  Occupational History   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  Tobacco Use   Smoking status: Never   Smokeless tobacco: Never  Vaping Use   Vaping Use: Never used  Substance and Sexual Activity   Alcohol use: No    Alcohol/week: 0.0 standard drinks   Drug use: No   Sexual activity: Not Currently    Birth control/protection: Post-menopausal  Other Topics Concern   Not on file  Social History Narrative   Lives alone in a one story home.  Has 2 children, one son and one daughter with 2 grandkids and 1 great-grandchild.    Retired from working for a bank.     Education: high school.    Social Determinants of Health   Financial Resource Strain: Low Risk    Difficulty of Paying Living Expenses: Not hard at all  Food Insecurity: No Food Insecurity   Worried About Charity fundraiser in the Last Year: Never true   [place] in the Last Year: Never true  Transportation Needs: No Transportation Needs   Lack of Transportation (Medical): No   Lack of Transportation (Non-Medical): No  Physical Activity: Inactive   Days of Exercise per Week: 0 days   Minutes of Exercise per Session: 0 min   Stress: No Stress Concern Present   Feeling of Stress : Not at all  Social Connections: Socially Isolated   Frequency of Communication with Friends and Family: More than three times a week   Frequency of Social Gatherings with Friends and Family: Once a week   Attends Religious Services: Never   Marine scientist or Organizations: No   Attends Archivist Meetings: Never   Marital Status: Widowed  Human resources officer Violence: Not At Risk   Fear of Current or Ex-Partner: No   Emotionally Abused: No   Physically Abused: No   Sexually Abused: No    Review of Systems:    Constitutional: No weight loss, fever or chills Cardiovascular: No chest pain Respiratory: No SOB Gastrointestinal: See HPI and otherwise negative   Physical Exam:  Vital signs: BP 118/84    Pulse 72    Ht '4\' 11"'$  (1.499 m)    Wt 154 lb 9.6 oz (70.1 kg)    BMI 31.23 kg/m    Constitutional:   Pleasant Elderly Caucasian female appears to be in NAD, Well developed, Well nourished, alert and cooperative Respiratory: Respirations even and unlabored. Lungs clear to auscultation bilaterally.   No wheezes, crackles, or rhonchi.  Cardiovascular: Normal S1, S2. No MRG. Regular rate and rhythm. No peripheral edema, cyanosis or pallor.  Gastrointestinal:  Soft, nondistended, moderate-marked epigastric ttp with involuntary guarding. Normal bowel sounds. No appreciable masses or hepatomegaly. Rectal:  Not performed.  Psychiatric: Oriented to person, place and time. Demonstrates good judgement and reason without abnormal affect or behaviors.  RELEVANT LABS  AND IMAGING: CBC    Component Value Date/Time   WBC 8.4 04/23/2021 1221   RBC 4.23 04/23/2021 1221   HGB 13.0 04/23/2021 1221   HCT 40.4 04/23/2021 1221   PLT 309 04/23/2021 1221   MCV 95.5 04/23/2021 1221   MCH 30.7 04/23/2021 1221   MCHC 32.2 04/23/2021 1221   RDW 12.5 04/23/2021 1221   LYMPHSABS 1.2 04/23/2021 1221   MONOABS 0.5 04/23/2021 1221    EOSABS 0.0 04/23/2021 1221   BASOSABS 0.0 04/23/2021 1221    CMP     Component Value Date/Time   NA 138 04/23/2021 1221   NA 144 03/16/2020 1152   K 3.6 04/23/2021 1221   CL 106 04/23/2021 1221   CO2 25 04/23/2021 1221   GLUCOSE 89 04/23/2021 1221   BUN 18 04/23/2021 1221   BUN 14 03/16/2020 1152   CREATININE 0.82 04/23/2021 1221   CREATININE 0.91 (H) 02/02/2020 1000   CALCIUM 10.8 (H) 04/23/2021 1221   PROT 6.7 04/23/2021 1221   ALBUMIN 4.1 04/23/2021 1221   AST 18 04/23/2021 1221   ALT 13 04/23/2021 1221   ALKPHOS 68 04/23/2021 1221   BILITOT 0.8 04/23/2021 1221   GFRNONAA >60 04/23/2021 1221   GFRAA 74 03/16/2020 1152    Assessment: 1.  Chronic epigastric pain: Continues regardless of trying Carafate 4 times daily and being on Nexium 40 twice daily and the addition of Pepcid 40 twice a day about a month ago, initially aggravated by Bystolic, now continues; likely ongoing gastritis +/- PUD 2.  GERD 3.  Constipation: Resolved  Plan: 1.  Discussed with patient that we will try 1 last medication switch but if this is not helpful then would recommend EGD versus CT for further evaluation.  Stopped patient's Nexium and prescribed Dexilant 60 mg daily.  Discussed this medication is time-released and also has sodium bicarbonate in it, so hopefully will work.  If this is too expensive for her then would recommend Pantoprazole 40 twice daily instead. 2.  Refilled Famotidine 40 mg twice daily, every morning and nightly #180 with 3 refills sent to her mail order pharmacy 3.  Continue GI cocktail every 4-6 hours as needed.  Patient has a prescription for this at home.  Discussed that this is perfectly fine to her for her to use that often if she needs it. 4.  Discussed constipation, it seems this has resolved, discussed use of MiraLAX in a smaller dose if needed in the future. 5.  Patient will let me know how she is doing after 1 to 2 weeks of being on the new medication.  If no change  again would recommend CT versus EGD. 6.  Patient follow in clinic with per recommendations after she checks in with me in the next couple weeks.  Robin Newer, PA-C [facility] 07/25/2021, 9:18 AM  Cc: [name], MD

## 2021-07-25 NOTE — Patient Instructions (Signed)
Stop Nexium. ? ?We have sent the following medications to your pharmacy for you to pick up at your convenience: ?Dexilant 60 mg daily.  ?Pepcid 40 mg twice daily.  ? ?If you are age 86 or older, your body mass index should be between 23-30. Your Body mass index is 31.23 kg/m?Marland Kitchen If this is out of the aforementioned range listed, please consider follow up with your Primary Care Provider. ? ?If you are age 75 or younger, your body mass index should be between 19-25. Your Body mass index is 31.23 kg/m?Marland Kitchen If this is out of the aformentioned range listed, please consider follow up with your Primary Care Provider.  ? ?________________________________________________________ ? ?The [facility] providers would like to encourage you to use [facility] to communicate with providers for non-urgent requests or questions.  Due to long hold times on the telephone, sending your provider a message by [facility] may be a faster and more efficient way to get a response.  Please allow 48 business hours for a response.  Please remember that this is for non-urgent requests.  ?_______________________________________________________ ? ?

## 2021-07-25 NOTE — Progress Notes (Unsigned)
HPI: FU dizziness and hypertension. ABIs August 2010 normal. Stress echo February 2014 normal. MRI July 2018 showed chronic small vessel ischemic changes in the cerebral white matter and pons. MRA without significant vertebrobasilar disease. CTA showed normal circle of Willis. EEG also normal. Patient has chronic mild dizziness. CTA September 2020 showed no pulmonary embolus. There was note of coronary artery disease/aortic atherosclerosis. Echocardiogram April 2022 showed normal LV function, mild mitral regurgitation; mild aortic stenosis with mean gradient 10 mmHg.  Monitor January 2023 showed sinus rhythm with PACs, brief PAT, PVCs and rare couplet.  Since last seen,   Current Outpatient Medications  Medication Sig Dispense Refill   acetaminophen (TYLENOL) 500 MG tablet Take 500-1,000 mg by mouth every 6 (six) hours as needed for mild pain or headache.      AMBULATORY NON FORMULARY MEDICATION Medication Name: GI Cocktail - 90 ml 2% Lidocaine, 90 ml Dicyclomine '10mg'$ /70m, 270 ml Maalox - Take 5-10 ml every 6 hour as needed. 450 mL 1   carvedilol (COREG) 3.125 MG tablet Take 1 tablet (3.125 mg total) by mouth daily. 30 tablet 6   cetirizine (ZYRTEC) 10 MG tablet Take 10 mg by mouth daily.     Cholecalciferol 25 MCG (1000 UT) TBDP Take 1,000 Units by mouth daily. 90 tablet 3   clobetasol ointment (TEMOVATE) 0.05 % Apply to affected area twice weekly. 60 g 4   esomeprazole (NEXIUM) 40 MG capsule Take 1 capsule (40 mg total) by mouth 2 (two) times daily before a meal. 180 capsule 0   ezetimibe (ZETIA) 10 MG tablet TAKE 1 BY MOUTH DAILY 90 tablet 1   famotidine (PEPCID) 40 MG tablet Take 1 tablet (40 mg total) by mouth 2 (two) times daily. Take morning and bedtime; continue with nexium. 60 tablet 0   furosemide (LASIX) 20 MG tablet 1 tablet by mouth every other day as needed for swelling 45 tablet 3   hydrALAZINE (APRESOLINE) 25 MG tablet Take 0.5 tablets (12.5 mg total) by mouth 2 (two) times  daily. 30 tablet 11   rosuvastatin (CRESTOR) 5 MG tablet Take 1 tablet (5 mg total) by mouth daily. 90 tablet 3   spironolactone (ALDACTONE) 25 MG tablet Take 1 tablet (25 mg total) by mouth 2 (two) times daily. 180 tablet 2   No current facility-administered medications for this visit.     Past Medical History:  Diagnosis Date   Abnormal Pap smear of cervix    Allergy    Arthritis    Asthma    Cataract 2018   bilateral eyes   Colon polyps    tubulovillous adenoma, 2010   External hemorrhoids without mention of complication    GERD (gastroesophageal reflux disease)    Headache    eval with neuro in 2011   [place]    seeing Dr. KDwyane Deein endocrinology   Hypertension    IBS (irritable bowel syndrome), GERD, hx colon polyps, chronic abd pain - followed by Dr. 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  Has 2 children, one son and one daughter with 2  grandkids and 1 great-grandchild.    Retired from working for a bank.     Education: high school.    Social Determinants of Health   Financial Resource Strain: Low Risk    Difficulty of Paying Living Expenses: Not hard at all  Food Insecurity: No Food Insecurity   Worried About Charity fundraiser in the Last Year: Never true   [place] in the Last Year: Never true  Transportation Needs: No Transportation Needs   Lack of Transportation (Medical): No   Lack of Transportation (Non-Medical): No  Physical Activity: Inactive   Days of Exercise per Week: 0 days   Minutes of Exercise per Session: 0 min  Stress: No Stress Concern Present   Feeling of Stress : Not at all  Social Connections: Socially Isolated   Frequency of Communication with Friends and Family: More than three times a week   Frequency of Social Gatherings with Friends and Family: Once a week   Attends Religious Services: Never   Marine scientist or Organizations: No   Attends Archivist Meetings: Never   Marital Status: Widowed  Human resources officer Violence: Not At Risk   Fear of Current or Ex-Partner: No   Emotionally Abused: No   Physically Abused: No   Sexually Abused: No    Family History  Problem Relation Age of Onset   Diabetes Brother    Hypertension Brother    Stroke Brother 31   Hypertension Father    Stroke Father    Diabetes Sister    Hypertension Sister    Heart disease Sister        heart failure   CAD Mother        Died of MI at age 45   Hypertension Mother    CAD Brother        Died of MI at age 39   Hypertension Brother    Stroke Sister 62   Hypertension Sister    Stroke Brother 66   Hypertension Brother    Colon cancer Neg Hx    Colon polyps Neg Hx    Esophageal cancer Neg Hx    Pancreatic cancer Neg Hx    Stomach cancer Neg Hx    Rectal cancer Neg Hx     ROS: no fevers or chills, productive cough, hemoptysis, dysphasia, odynophagia, melena, hematochezia,  dysuria, hematuria, rash, seizure activity, orthopnea, PND, pedal edema, claudication. Remaining systems are negative.  Physical Exam: Well-developed well-nourished in no acute distress.  Skin is warm and dry.  HEENT is normal.  Neck is supple.  Chest is clear to auscultation with normal expansion.  Cardiovascular exam is regular rate and rhythm.  Abdominal exam nontender or distended. No masses palpated. Extremities show no edema. neuro grossly intact  ECG- personally reviewed  A/P  1 history of syncope-no recurrences since previous office visit.  LV function normal.  We will continue to follow.  2 coronary artery disease-based on previous CT showing coronary calcification.  We will continue statin.  3 hypertension-patient has not tolerated multiple medications in the past as outlined.  4 hyperlipidemia-continue Zetia and statin.  5 history of mild aortic stenosis-patient will require follow-up echoes in the future.  6 dizziness-this is chronic and previous evaluation unrevealing.  Kirk Ruths, MD

## 2021-07-26 ENCOUNTER — Telehealth: Payer: Self-pay | Admitting: Pharmacist

## 2021-07-26 NOTE — Chronic Care Management (AMB) (Signed)
? ? ?  Chronic Care Management ?Pharmacy Assistant  ? ?Name: Robin [name]  MRN: [id number] DOB: 1934/02/24 ? ?Reason for Encounter: Schedule a follow up with Jeni Salles ?  ?Recent office visits:  ?None ? ?Recent consult visits:  ?2036-01-19 Levin Erp, PA Gertie Fey) - Patient presented for Abdominal pain epigastric and other concerns. Prescribed Dexlansoprazole, Changed Famotidine and Stopped Esomeprazole. ? ?Hospital visits:  ?None in previous 6 months ? ?Medications: ?Outpatient Encounter Medications as of 07/26/2021  ?Medication Sig  ? acetaminophen (TYLENOL) 500 MG tablet Take 500-1,000 mg by mouth every 6 (six) hours as needed for mild pain or headache.   ? AMBULATORY NON FORMULARY MEDICATION Medication Name: GI Cocktail - 90 ml 2% Lidocaine, 90 ml Dicyclomine '10mg'$ /61m, 270 ml Maalox - Take 5-10 ml every 6 hour as needed.  ? carvedilol (COREG) 3.125 MG tablet Take 1 tablet (3.125 mg total) by mouth daily.  ? cetirizine (ZYRTEC) 10 MG tablet Take 10 mg by mouth daily.  ? Cholecalciferol 25 MCG (1000 UT) TBDP Take 1,000 Units by mouth daily.  ? clobetasol ointment (TEMOVATE) 0.05 % Apply to affected area twice weekly.  ? dexlansoprazole (DEXILANT) 60 MG capsule Take 1 capsule (60 mg total) by mouth daily.  ? ezetimibe (ZETIA) 10 MG tablet TAKE 1 BY MOUTH DAILY  ? famotidine (PEPCID) 40 MG tablet Take 1 tablet (40 mg total) by mouth 2 (two) times daily.  ? furosemide (LASIX) 20 MG tablet 1 tablet by mouth every other day as needed for swelling  ? hydrALAZINE (APRESOLINE) 25 MG tablet Take 0.5 tablets (12.5 mg total) by mouth 2 (two) times daily.  ? rosuvastatin (CRESTOR) 5 MG tablet Take 1 tablet (5 mg total) by mouth daily.  ? spironolactone (ALDACTONE) 25 MG tablet Take 1 tablet (25 mg total) by mouth 2 (two) times daily.  ? ?No facility-administered encounter medications on file as of 07/26/2021.  ?Notes:  ?Call to patient to offer appointment with MJeni Salles Patient accepted and in  agreement. ? ? ?Care Gaps: ?COVID Booster - Overdue ? ?Star Rating Drugs: ?Rosuvastatin 5 mg - Last filled 05/17/21 90 DS at [facility] ? ? ?LNed ClinesCMA ?Clinical Pharmacist Assistant ?[phone]? ?

## 2021-08-01 ENCOUNTER — Other Ambulatory Visit: Payer: Self-pay

## 2021-08-01 ENCOUNTER — Ambulatory Visit: Payer: PPO | Admitting: Cardiology

## 2021-08-01 ENCOUNTER — Encounter: Payer: Self-pay | Admitting: Cardiology

## 2021-08-01 ENCOUNTER — Encounter: Payer: Self-pay | Admitting: Family Medicine

## 2021-08-01 VITALS — BP 148/80 | HR 70 | Ht 59.0 in | Wt 153.6 lb

## 2021-08-01 DIAGNOSIS — I10 Essential (primary) hypertension: Secondary | ICD-10-CM | POA: Diagnosis not present

## 2021-08-01 DIAGNOSIS — E7800 Pure hypercholesterolemia, unspecified: Secondary | ICD-10-CM

## 2021-08-01 DIAGNOSIS — I2510 Atherosclerotic heart disease of native coronary artery without angina pectoris: Secondary | ICD-10-CM

## 2021-08-01 DIAGNOSIS — I350 Nonrheumatic aortic (valve) stenosis: Secondary | ICD-10-CM

## 2021-08-01 MED ORDER — ROSUVASTATIN CALCIUM 5 MG PO TABS: 2.5000 mg | ORAL_TABLET | Freq: Every day | ORAL | 3 refills | Status: DC

## 2021-08-01 MED ORDER — CARVEDILOL 3.125 MG PO TABS: 3.1250 mg | ORAL_TABLET | Freq: Two times a day (BID) | ORAL | 3 refills | Status: DC

## 2021-08-01 NOTE — Patient Instructions (Signed)
Medication Instructions:  ? ?TAKE CARVEDILOL 3/125 MG ONE TABLET TWICE DAILY ? ?*If you need a refill on your cardiac medications before your next appointment, please call your pharmacy* ? ? ?Follow-Up: ?At [facility], you and your health needs are our priority.  As part of our continuing mission to provide you with exceptional heart care, we have created designated Provider Care Teams.  These Care Teams include your primary Cardiologist (physician) and Advanced Practice Providers (APPs -  Physician Assistants and Nurse Practitioners) who all work together to provide you with the care you need, when you need it. ? ?We recommend signing up for the patient portal called "MyChart".  Sign up information is provided on this After Visit Summary.  MyChart is used to connect with patients for Virtual Visits (Telemedicine).  Patients are able to view lab/test results, encounter notes, upcoming appointments, etc.  Non-urgent messages can be sent to your provider as well.   ?To learn more about what you can do with MyChart, go to [email or web address].   ? ?Your next appointment:   ?6 month(s) ? ?The format for your next appointment:   ?In Person ? ?Provider:   ?Kirk Ruths, MD   ? ? ?

## 2021-08-02 ENCOUNTER — Other Ambulatory Visit: Payer: Self-pay | Admitting: Family Medicine

## 2021-08-02 MED ORDER — SPIRONOLACTONE 50 MG PO TABS: 50.0000 mg | ORAL_TABLET | Freq: Two times a day (BID) | ORAL | 2 refills | Status: DC

## 2021-08-05 ENCOUNTER — Telehealth: Payer: Self-pay | Admitting: Family Medicine

## 2021-08-05 NOTE — Telephone Encounter (Signed)
[place] needs to know if the spironolactone (ALDACTONE) 50 MG tablet [[id number]] replaces the '25mg'$  the patient was taking previously. ? ?[facility] could be contacted at [phone]. ?Refernence number: [id number] ? ?If you choose to leave clarification on voicemail, they would need the patients first and last name. ? ?Please advise. ?

## 2021-08-05 NOTE — Telephone Encounter (Signed)
Upon review of chart, pt message noted on 08/01/21: ?Dr Ethlyn Gallery: For the spironolactone, are you still taking it 25 mg twice a day?  Wanted to make sure I sent in the right milligram dose. ?Pt: Also, I am taking '50mg'$  of the Spironlactone twice a day ? ?Left detailed message on voicemail to the number provided of clarification that medication is Spironolactone '50mg'$  BID. ?

## 2021-08-25 ENCOUNTER — Other Ambulatory Visit: Payer: Self-pay | Admitting: *Deleted

## 2021-08-25 DIAGNOSIS — E7800 Pure hypercholesterolemia, unspecified: Secondary | ICD-10-CM

## 2021-08-25 MED ORDER — EZETIMIBE 10 MG PO TABS: ORAL_TABLET | ORAL | 0 refills | Status: DC

## 2021-08-25 NOTE — Telephone Encounter (Signed)
Rx done. 

## 2021-08-29 MED ORDER — RABEPRAZOLE SODIUM 20 MG PO TBEC: 20.0000 mg | DELAYED_RELEASE_TABLET | Freq: Two times a day (BID) | ORAL | 2 refills | Status: DC

## 2021-09-29 ENCOUNTER — Other Ambulatory Visit: Payer: Self-pay

## 2021-09-29 MED ORDER — HYDRALAZINE HCL 25 MG PO TABS
12.5000 mg | ORAL_TABLET | Freq: Two times a day (BID) | ORAL | 2 refills | Status: DC
Start: 2021-09-29 — End: 2022-01-02

## 2021-10-03 ENCOUNTER — Encounter: Payer: Self-pay | Admitting: Family Medicine

## 2021-10-03 ENCOUNTER — Ambulatory Visit (INDEPENDENT_AMBULATORY_CARE_PROVIDER_SITE_OTHER): Payer: PPO | Admitting: Family Medicine

## 2021-10-03 VITALS — BP 164/80 | HR 70 | Temp 98.0°F | Ht 59.0 in | Wt 152.5 lb

## 2021-10-03 DIAGNOSIS — E538 Deficiency of other specified B group vitamins: Secondary | ICD-10-CM

## 2021-10-03 DIAGNOSIS — I10 Essential (primary) hypertension: Secondary | ICD-10-CM | POA: Diagnosis not present

## 2021-10-03 DIAGNOSIS — K219 Gastro-esophageal reflux disease without esophagitis: Secondary | ICD-10-CM | POA: Diagnosis not present

## 2021-10-03 DIAGNOSIS — E611 Iron deficiency: Secondary | ICD-10-CM | POA: Diagnosis not present

## 2021-10-03 DIAGNOSIS — E213 Hyperparathyroidism, unspecified: Secondary | ICD-10-CM

## 2021-10-03 DIAGNOSIS — E785 Hyperlipidemia, unspecified: Secondary | ICD-10-CM

## 2021-10-03 DIAGNOSIS — R252 Cramp and spasm: Secondary | ICD-10-CM | POA: Diagnosis not present

## 2021-10-03 LAB — COMPREHENSIVE METABOLIC PANEL
ALT: 13 U/L (ref 0–35)
AST: 17 U/L (ref 0–37)
Albumin: 4.5 g/dL (ref 3.5–5.2)
Alkaline Phosphatase: 68 U/L (ref 39–117)
BUN: 19 mg/dL (ref 6–23)
CO2: 29 mEq/L (ref 19–32)
Calcium: 11.4 mg/dL — ABNORMAL HIGH (ref 8.4–10.5)
Chloride: 103 mEq/L (ref 96–112)
Creatinine, Ser: 1.08 mg/dL (ref 0.40–1.20)
GFR: 46.76 mL/min — ABNORMAL LOW (ref >60.00)
Glucose, Bld: 90 mg/dL (ref 70–99)
Potassium: 4.2 mEq/L (ref 3.5–5.1)
Sodium: 139 mEq/L (ref 135–145)
Total Bilirubin: 0.7 mg/dL (ref 0.2–1.2)
Total Protein: 7.1 g/dL (ref 6.0–8.3)

## 2021-10-03 LAB — FERRITIN: Ferritin: 33.7 ng/mL (ref 10.0–291.0)

## 2021-10-03 LAB — CBC WITH DIFFERENTIAL/PLATELET
Basophils Absolute: 0 10*3/uL (ref 0.0–0.1)
Basophils Relative: 0.7 % (ref 0.0–3.0)
Eosinophils Absolute: 0.1 10*3/uL (ref 0.0–0.7)
Eosinophils Relative: 1.4 % (ref 0.0–5.0)
HCT: 37.8 % (ref 36.0–46.0)
Hemoglobin: 12.7 g/dL (ref 12.0–15.0)
Lymphocytes Relative: 18.7 % (ref 12.0–46.0)
Lymphs Abs: 1.3 10*3/uL (ref 0.7–4.0)
MCHC: 33.5 g/dL (ref 30.0–36.0)
MCV: 94.2 fl (ref 78.0–100.0)
Monocytes Absolute: 0.4 10*3/uL (ref 0.1–1.0)
Monocytes Relative: 5.3 % (ref 3.0–12.0)
Neutro Abs: 5.2 10*3/uL (ref 1.4–7.7)
Neutrophils Relative %: 73.9 % (ref 43.0–77.0)
Platelets: 292 10*3/uL (ref 150.0–400.0)
RBC: 4.02 Mil/uL (ref 3.87–5.11)
RDW: 13.2 % (ref 11.5–15.5)
WBC: 7.1 10*3/uL (ref 4.0–10.5)

## 2021-10-03 LAB — LIPID PANEL
Cholesterol: 166 mg/dL (ref 0–200)
HDL: 59.5 mg/dL (ref >39.00)
LDL Cholesterol: 87 mg/dL (ref 0–99)
NonHDL: 106.22
Total CHOL/HDL Ratio: 3
Triglycerides: 94 mg/dL (ref 0.0–149.0)
VLDL: 18.8 mg/dL (ref 0.0–40.0)

## 2021-10-03 LAB — VITAMIN B12: Vitamin B-12: 325 pg/mL (ref 211–911)

## 2021-10-03 LAB — FOLATE: Folate: 16.7 ng/mL (ref >5.9)

## 2021-10-03 LAB — TSH: TSH: 1.81 u[IU]/mL (ref 0.35–5.50)

## 2021-10-03 LAB — MAGNESIUM: Magnesium: 2 mg/dL (ref 1.5–2.5)

## 2021-10-03 MED ORDER — CARVEDILOL 3.125 MG PO TABS: ORAL_TABLET | ORAL | 3 refills | Status: DC

## 2021-10-03 MED ORDER — SPIRONOLACTONE 50 MG PO TABS: 25.0000 mg | ORAL_TABLET | Freq: Two times a day (BID) | ORAL | 2 refills | Status: DC

## 2021-10-03 NOTE — Progress Notes (Unsigned)
Robin [name] DOB: 21-Feb-1934 Encounter date: 10/03/2021  This is a 88 y.o. female who presents with Chief Complaint  Patient presents with   Follow-up    History of present illness:  Tingling on left side- she is worried about this. Thinks coming from coreg. Like how hands feel when they are falling asleep. Started in arms, went down leg on left. Not getting worse. But not gone away. Started right after starting coreg. Also cramps in legs - not the kind you can stretch out. They are deeper inside. At night she is having some sensation of shortness of breath. Harder to get full breath in and out. She will get up and walk around. Not sure when that started. Not happening every night. Feeling weak frequently when pressures are getting low. Frequently getting pressures less than 016 with diastolic in 01'U, HR in 93'A.   Pressures at home are running 116-138/60-74. When she went to see cardiology she was told to take the coreg BID. She started taking BID but then she started fainting about an hour after her first dose. This happened repeatedly. Stopped the evening dose and no more fainting.   Reflux: aciphex recently started by GI. She is still dealing with gastric issues. Previous GI med worked well, but was $100/month (dexilant) and she couldn't afford this. She is still taking pepcid BID. All GI sx were also present when she started coreg; so not sure what caused what.   TF:TDDUKGU '5mg'$  daily  Allergies  Allergen Reactions   Prilosec [Omeprazole] Cough   Amlodipine     Feet and ankle swelling   Atenolol     Did not help lower bp   Bystolic [Nebivolol Hcl]     Severe stomach pain   Chlorthalidone     Cramps, low back pain and itchy scalp   Clonidine Derivatives     fainting   Losartan     Did not lower bp   Protonix [Pantoprazole Sodium] Swelling   Amoxicillin Rash   Flagyl [Metronidazole] Rash   Current Meds  Medication Sig   acetaminophen (TYLENOL) 500 MG tablet Take  500-1,000 mg by mouth every 6 (six) hours as needed for mild pain or headache.    AMBULATORY NON FORMULARY MEDICATION Medication Name: GI Cocktail - 90 ml 2% Lidocaine, 90 ml Dicyclomine '10mg'$ /55m, 270 ml Maalox - Take 5-10 ml every 6 hour as needed.   carvedilol (COREG) 3.125 MG tablet Take 1 tablet (3.125 mg total) by mouth 2 (two) times daily with a meal. (Patient taking differently: Take 3.125 mg by mouth daily.)   cetirizine (ZYRTEC) 10 MG tablet Take 10 mg by mouth daily.   Cholecalciferol 25 MCG (1000 UT) TBDP Take 1,000 Units by mouth daily.   clobetasol ointment (TEMOVATE) 0.05 % Apply to affected area twice weekly.   ezetimibe (ZETIA) 10 MG tablet TAKE 1 BY MOUTH DAILY   famotidine (PEPCID) 40 MG tablet Take 1 tablet (40 mg total) by mouth 2 (two) times daily.   furosemide (LASIX) 20 MG tablet 1 tablet by mouth every other day as needed for swelling   hydrALAZINE (APRESOLINE) 25 MG tablet Take 0.5 tablets (12.5 mg total) by mouth 2 (two) times daily.   RABEprazole (ACIPHEX) 20 MG tablet Take 1 tablet (20 mg total) by mouth in the morning and at bedtime. (Patient taking differently: Take 20 mg by mouth as needed.)   rosuvastatin (CRESTOR) 5 MG tablet Take 0.5 tablets (2.5 mg total) by mouth daily.   spironolactone (  ALDACTONE) 50 MG tablet Take 1 tablet (50 mg total) by mouth 2 (two) times daily.    Review of Systems  Constitutional:  Positive for fatigue. Negative for chills and fever.  Respiratory:  Negative for cough, chest tightness, shortness of breath and wheezing.   Cardiovascular:  Negative for chest pain, palpitations and leg swelling.  Gastrointestinal:        Stomach upset   Objective:  BP (!) 164/80 Comment: repeat by Mykjal--jaf  Pulse 70   Temp 98 F (36.7 C) (Oral)   Ht '4\' 11"'$  (1.499 m)   Wt 152 lb 8 oz (69.2 kg)   SpO2 99%   BMI 30.80 kg/m   Weight: 152 lb 8 oz (69.2 kg)   BP Readings from Last 3 Encounters:  10/03/21 (!) 164/80  08/01/21 (!) 148/80   07/25/21 118/84   Wt Readings from Last 3 Encounters:  10/03/21 152 lb 8 oz (69.2 kg)  08/01/21 153 lb 9.6 oz (69.7 kg)  07/25/21 154 lb 9.6 oz (70.1 kg)    Physical Exam Constitutional:      General: She is not in acute distress.    Appearance: She is well-developed.  Cardiovascular:     Rate and Rhythm: Normal rate and regular rhythm.     Heart sounds: Normal heart sounds. No murmur heard.   No friction rub.  Pulmonary:     Effort: Pulmonary effort is normal. No respiratory distress.     Breath sounds: Normal breath sounds. No wheezing or rales.  Musculoskeletal:     Right lower leg: No edema.     Left lower leg: No edema.  Neurological:     Mental Status: She is alert and oriented to person, place, and time.  Psychiatric:        Behavior: Behavior normal.    Assessment/Plan  1. Hypertension, essential, benign Home blood pressure readings are actually the best that they have been in a long time.  I am concerned with her continuing to be compliant with medications and she is worried about side effects.  I have encouraged her to experiment with taking just 1/2 tablet of the carvedilol and the spironolactone to see if this helps at all with some of her concern for side effects (tingling with the Coreg, muscle cramps with spironolactone).  I encouraged her to continue to check her blood pressures along with these changes.  She will need to follow-up with new PCP/cardiology for recheck.  Current regimen seems to be the best tolerated that she has been on for quite some time with the best results.  The pressures generally are high in the office, they are running much better and more steady at home and they have been for the past few years. - CBC with Differential/Platelet; Future - Comprehensive metabolic panel; Future - Comprehensive metabolic panel - CBC with Differential/Platelet  2. Dyslipidemia Continue Crestor. - Lipid Panel  3. Hyperparathyroidism ([place]) She does not  want to follow back up with endocrinology, but if labs are abnormal would consider if discussed first with her. - PTH, Intact and Calcium  4. Gastroesophageal reflux disease, unspecified whether esophagitis present Following regularly with GI.  Recently changed to Aciphex.  5. B12 deficiency - Vitamin B12; Future - Folate; Future - Methylmalonic acid, serum; Future - Homocysteine; Future - Homocysteine - Methylmalonic acid, serum - Folate - Vitamin B12  6. Iron deficiency - CBC with Differential/Platelet; Future - Ferritin; Future - Ferritin - CBC with Differential/Platelet  7. Muscle cramps Uncertain  if spironolactone is contributing.  Cramps seem to come around the time of starting this medication.  It is see if backing off slightly on the dose is helpful for her. - Magnesium - TSH; Future - TSH   Return in about 3 months (around 01/03/2022) for establish care.      Micheline Rough, MD

## 2021-10-03 NOTE — Patient Instructions (Addendum)
Ok to split the coreg - try taking just 1/2 tab twice daily.   Can also try taking 1/2 tab of the spironolactone twice daily to see how pressures do and how you feel with this.   Make sure you are keeping up with water intake daily. I'll be in touch with you once I get bloodwork results.

## 2021-10-06 ENCOUNTER — Encounter: Payer: Self-pay | Admitting: Family Medicine

## 2021-10-06 LAB — PTH, INTACT AND CALCIUM
Calcium: 11.2 mg/dL — ABNORMAL HIGH (ref 8.6–10.4)
PTH: 72 pg/mL (ref 16–77)

## 2021-10-06 LAB — HOMOCYSTEINE: Homocysteine: 14.5 umol/L — ABNORMAL HIGH (ref <10.4)

## 2021-10-06 LAB — METHYLMALONIC ACID, SERUM: Methylmalonic Acid, Quant: 133 nmol/L (ref 87–318)

## 2021-10-06 LAB — EXTRA SPECIMEN

## 2021-10-21 ENCOUNTER — Telehealth: Payer: Self-pay | Admitting: Physician Assistant

## 2021-10-21 MED ORDER — AMBULATORY NON FORMULARY MEDICATION: 3 refills | Status: AC

## 2021-10-21 NOTE — Telephone Encounter (Signed)
Refill faxed to [facility] for GI cocktail

## 2021-10-21 NOTE — Telephone Encounter (Signed)
Patient called stating she went to her pharmacy to get a refill of the GI cocktail she was prescribed and one of the ingredients was on backorder.  She thinks it may have been the lidocaine.  However, the pharmacy was supposed to contact us to see if there was anything they could substitute for it.  She uses [facility].  Please call pharmacy and patient and advise.  Thank you.

## 2021-10-24 NOTE — Telephone Encounter (Signed)
Received fax from [facility] that Lidocaine Viscous is unavailable. Do you want to change script please advise?

## 2021-10-25 NOTE — Telephone Encounter (Signed)
Spoke with patient she states she want to find someone else that can make the GI cocktail.

## 2021-10-26 NOTE — Telephone Encounter (Signed)
Faxed prescription to [facility] on [address].

## 2021-10-27 NOTE — Telephone Encounter (Signed)
Spoke with [facility] and the confirmed they were able to fill the script for the GI cocktail. Informed patient. Patient will call back if she does not hear from them today.

## 2021-11-11 ENCOUNTER — Telehealth: Payer: Self-pay | Admitting: Pharmacist

## 2021-11-11 NOTE — Chronic Care Management (AMB) (Signed)
    Chronic Care Management Pharmacy Assistant   Name: Robin [name]  MRN: [id number] DOB: 1934/04/23  11/11/21 APPOINTMENT REMINDER    Patient was reminded to have all medications, supplements and any blood glucose and blood pressure readings available for review with Jeni Salles, Pharm. D, for telephone visit on 11/14/21 at 8:30.    Care Gaps: Covid Booster - Overdue Zoster Vaccine - Postponed AWV- 7/22 BP- 164/80 10/03/21  Star Rating Drug: Rosuvastatin 5 mg - Last filled 07/26/21 90 DS at [facility]     Medications: Outpatient Encounter Medications as of 11/11/2021  Medication Sig   acetaminophen (TYLENOL) 500 MG tablet Take 500-1,000 mg by mouth every 6 (six) hours as needed for mild pain or headache.    AMBULATORY NON FORMULARY MEDICATION Medication Name: GI Cocktail - 90 ml 2% Lidocaine, 90 ml Dicyclomine '10mg'$ /30m, 270 ml Maalox - Take 5-10 ml every 6 hour as needed.   carvedilol (COREG) 3.125 MG tablet Take 1/2 tab PO BID   cetirizine (ZYRTEC) 10 MG tablet Take 10 mg by mouth daily.   Cholecalciferol 25 MCG (1000 UT) TBDP Take 1,000 Units by mouth daily.   clobetasol ointment (TEMOVATE) 0.05 % Apply to affected area twice weekly.   ezetimibe (ZETIA) 10 MG tablet TAKE 1 BY MOUTH DAILY   famotidine (PEPCID) 40 MG tablet Take 1 tablet (40 mg total) by mouth 2 (two) times daily.   furosemide (LASIX) 20 MG tablet 1 tablet by mouth every other day as needed for swelling   hydrALAZINE (APRESOLINE) 25 MG tablet Take 0.5 tablets (12.5 mg total) by mouth 2 (two) times daily.   RABEprazole (ACIPHEX) 20 MG tablet Take 1 tablet (20 mg total) by mouth in the morning and at bedtime. (Patient taking differently: Take 20 mg by mouth as needed.)   rosuvastatin (CRESTOR) 5 MG tablet Take 0.5 tablets (2.5 mg total) by mouth daily.   spironolactone (ALDACTONE) 50 MG tablet Take 0.5 tablets (25 mg total) by mouth 2 (two) times daily.   No facility-administered encounter medications on file as  of 11/11/2021.      [place] Pharmacist Assistant [phone]

## 2021-11-14 ENCOUNTER — Ambulatory Visit: Payer: PPO | Admitting: Pharmacist

## 2021-11-14 DIAGNOSIS — I10 Essential (primary) hypertension: Secondary | ICD-10-CM

## 2021-11-14 DIAGNOSIS — E785 Hyperlipidemia, unspecified: Secondary | ICD-10-CM

## 2021-11-14 NOTE — Patient Instructions (Signed)
Hi Robin [name],  It was great to catch up with you again! I am glad you are feeling better. Keep up the good work of checking your blood pressures and let us know if you start to see more readings in the low 100s or pulse readings in the 50s more often.  Please reach out to me if you have any questions or need anything before our follow up!  Best, Maddie  Jeni Salles, PharmD, [place] Pharmacist [facility] [place]   Visit Information   Goals Addressed   None    Patient Care Plan: CCM Pharmacy Care Plan     Problem Identified: Problem: Hypertension, Hyperlipidemia, GERD, Asthma, and Osteoporosis      Long-Range Goal: Patient-Specific Goal   Start Date: 01/24/2021  Expected End Date: 01/24/2021  Recent Progress: On track  Priority: High  Note:   Current Barriers:  Unable to achieve control of blood pressure   Pharmacist Clinical Goal(s):  Patient will achieve control of blood pressure as evidenced by home and office readings  through collaboration with PharmD and provider.   Interventions: 1:1 collaboration with [name], MD regarding development and update of comprehensive plan of care as evidenced by provider attestation and co-signature Inter-disciplinary care team collaboration (see longitudinal plan of care) Comprehensive medication review performed; medication list updated in electronic medical record  Hypertension (BP goal <140/90) -Uncontrolled -Current treatment: Spironolactone 25 mg 1 tablet twice daily - Appropriate, Effective, Safe, Accessible Hydralazine 25 mg 1/2 tablet three times daily - Appropriate, Effective, Safe, Accessible Carvedilol 3.125 mg 1/2 tablet twice daily - Appropriate, Effective, Safe, Accessible Furosemide 20 mg 1 tablet as needed - Appropriate, Effective, Safe, Accessible -Medications previously tried: chlorthalidone, clonidine, atenolol, hydralazine, amlodipine (swelling), lisinopril,  candesartan, azilsartan, olmesartan (change to combo), labetalol (cramping), telmisartan (headaches), metoprolol (bradycardia) -Current home readings: 109/66 HR 75, 146/71 HR 73, 124/61 HR 67, 125/63 HR 70, 132/66 HR 58, 134/69 HR 65  -Current dietary habits: working on reading package labels -Current exercise habits: does not routinely exercise -Reports hypotensive/hypertensive symptoms -Educated on Daily salt intake goal < 2300 mg; Exercise goal of 150 minutes per week; Importance of home blood pressure monitoring; Proper BP monitoring technique; Symptoms of hypotension and importance of maintaining adequate hydration; -Counseled to monitor BP at home daily, document, and provide log at future appointments -Counseled on diet and exercise extensively Recommended to continue current medication  Fluid retention (Goal: minimize swelling) -Controlled -Current treatment  Furosemide 20 mg 1 tablet as needed - Appropriate, Effective, Safe, Accessible -Medications previously tried: none  -Recommended to continue current medication   Hyperlipidemia: (LDL goal < 100) -Controlled -Current treatment: Zetia 10 mg 1 tablet daily - Appropriate, Effective, Safe, Accessible Rosuvastatin 5 mg 1/2 tablet daily - Appropriate, Effective, Safe, Accessible -Medications previously tried: rosuvastatin (cramping)  -Current dietary patterns: eats out some -Current exercise habits: does not routinely exercise -Educated on Cholesterol goals;  Benefits of statin for ASCVD risk reduction; Exercise goal of 150 minutes per week; -Recommended to continue current medication  Osteopenia (Goal prevent fractures) -Uncontrolled -Last DEXA Scan: 03/18/2019   T-Score femoral neck: -2.6  T-Score total hip: n/a  T-Score lumbar spine: -1.0  T-Score forearm radius: -2.7  10-year probability of major osteoporotic fracture: n/a  10-year probability of hip fracture: n/a -Patient is a candidate for pharmacologic  treatment due to T-Score < -2.5 in femoral neck -Current treatment  Vitamin D 1000 units daily - Appropriate, Effective, Safe, Accessible -Medications previously tried: none  -  Recommend [phone] units of vitamin D daily. Recommend weight-bearing and muscle strengthening exercises for building and maintaining bone density. -Counseled on diet and exercise extensively Reassess at follow up.  GERD (Goal: minimize symptoms) -Controlled -Current treatment  Rabeprazole 20 mg 1 capsule twice daily as needed - Appropriate, Effective, Safe, Accessible -Medications previously tried: esomeprazole, pantoprazole (stomach upset), Dexilant (cost), omeprazole (cost) -Counseled on non-pharmacologic management of symptoms such as elevating the head of your bed, avoiding eating 2-3 hours before bed, avoiding triggering foods such as acidic, spicy, or fatty foods, eating smaller meals, and wearing clothes that are loose around the waist Patient reports her triggers include tomatoes and spicy foods.  Health Maintenance -Vaccine gaps: shingrix -Current therapy:  Cetirizine 10 mg 1 tablet daily (took when dizziness started) - vertigo Vitamin B12 - 1000 mcg - sublingual Acetaminophen 500 mg as needed -Educated on Cost vs benefit of each product must be carefully weighed by individual consumer -Patient is satisfied with current therapy and denies issues -Recommended to continue current medication  Patient Goals/Self-Care Activities Patient will:  - take medications as prescribed check blood pressure daily, document, and provide at future appointments target a minimum of 150 minutes of moderate intensity exercise weekly engage in dietary modifications by limiting salt intake  Follow Up Plan: Telephone follow up appointment with care management team member scheduled for:4 months        Patient verbalizes understanding of instructions and care plan provided today and agrees to view in [place]. Active  MyChart status and patient understanding of how to access instructions and care plan via MyChart confirmed with patient.    Telephone follow up appointment with pharmacy team member scheduled for: 4 months  [name], [facility]

## 2021-11-14 NOTE — Progress Notes (Signed)
Chronic Care Management Pharmacy Note  11/14/2021 Name:  Robin [name] MRN:  [id number] DOB:  06-17-1933  Summary: BP is mostly at goal < 140/90 per home readings  Recommendations/Changes made from today's visit: -Recommended follow up with endocrinology -Scheduled TOC visit with new PCP -Requested Zetia refill  Plan: Follow up in 4 months  Subjective: Robin [name] is an 88 y.o. year old female who is a primary patient of [name], [name], MD (Inactive).  The CCM team was consulted for assistance with disease management and care coordination needs.    Engaged with patient by telephone for follow up visit in response to provider referral for pharmacy case management and/or care coordination services.   Consent to Services:  The patient was given information about Chronic Care Management services, agreed to services, and gave verbal consent prior to initiation of services.  Please see initial visit note for detailed documentation.   Patient Care Team: [name], MD (Inactive) as PCP - General (Family Medicine) [name] [name], MD as PCP - Cardiology (Cardiology) [name], MD as Consulting Physician (Gastroenterology) [name] [name], MD as Consulting Physician (Cardiology) [name], MD as Consulting Physician (Ophthalmology) [name], MD as Consulting Physician (Gynecology) [name], [facility] as Pharmacist (Pharmacist)  Recent office visits: 10/03/21 Micheline Rough, MD: Patient presented for HTN follow up. Recommended splitting Coreg to 1/2 tablet BID and spironolactone 1 tablet BID. Recommended a few days a week of [phone] mcg vitamin B12. Recommended follow up with endocrinology.  07/04/21 [name], MD - Patient presented for Gastroesophageal reflux disease and other concerns. Prescribed Famotidine 40 mg. Stopped Diltiazem and Hydroxyzine.  Recent consult visits: 08/01/21 [name], MD (Cardiology) - Patient  presented for HTN follow up. Recommended carvedilol BID and follow up in 6 months.  07/25/21 Levin Erp, PA Gertie Fey) - Patient presented for Abdominal pain epigastric and other concerns. Prescribed Dexlansoprazole, Changed Famotidine and Stopped Esomeprazole.  06/30/21 Deberah Pelton, NP (Cardiology) - Patient presented for Essential hypertension and other concerns. Prescribed Carvedilol, Changed Hydralazine, Stopped Capsaicin, Metoprolol and Sucralfate.   06/13/21 Lorenda Peck, MD (Podiatry) - Patient presented for Plantar fasciitis of left foot and other concerns. Prescribed Capsaicin.  Hospital visits: None in previous 6 months.   Objective:  Lab Results  Component Value Date   CREATININE 1.08 10/03/2021   BUN 19 10/03/2021   GFR 46.76 (L) 10/03/2021   GFRNONAA >60 04/23/2021   GFRAA 74 03/16/2020   NA 139 10/03/2021   K 4.2 10/03/2021   CALCIUM 11.2 (H) 10/03/2021   CALCIUM 11.4 (H) 10/03/2021   CO2 29 10/03/2021   GLUCOSE 90 10/03/2021    Lab Results  Component Value Date/Time   HGBA1C 5.5 09/30/2013 09:28 AM   HGBA1C 5.4 06/19/2012 09:19 AM   GFR 46.76 (L) 10/03/2021 10:22 AM   GFR 53.38 (L) 03/28/2021 10:56 AM    Last diabetic Eye exam: No results found for: "HMDIABEYEEXA"  Last diabetic Foot exam: No results found for: "HMDIABFOOTEX"   Lab Results  Component Value Date   CHOL 166 10/03/2021   HDL 59.50 10/03/2021   LDLCALC 87 10/03/2021   TRIG 94.0 10/03/2021   CHOLHDL 3 10/03/2021       Latest Ref Rng & Units 10/03/2021   10:22 AM 04/23/2021   12:21 PM 03/28/2021   10:56 AM  Hepatic Function  Total Protein 6.0 - 8.3 g/dL 7.1  6.7  7.3   Albumin 3.5 -  5.2 g/dL 4.5  4.1  4.6   AST 0 - 37 U/L $Remo'17  18  17   'iUxEk$ ALT 0 - 35 U/L $Remo'13  13  12   'vwreg$ Alk Phosphatase 39 - 117 U/L 68  68  89   Total Bilirubin 0.2 - 1.2 mg/dL 0.7  0.8  0.6     Lab Results  Component Value Date/Time   TSH 1.81 10/03/2021 10:22 AM   TSH 1.70 07/05/2020 12:33 PM        Latest Ref Rng & Units 10/03/2021   10:22 AM 04/23/2021   12:21 PM 03/28/2021   10:56 AM  CBC  WBC 4.0 - 10.5 K/uL 7.1  8.4  6.4   Hemoglobin 12.0 - 15.0 g/dL 12.7  13.0  13.7   Hematocrit 36.0 - 46.0 % 37.8  40.4  41.0   Platelets 150.0 - 400.0 K/uL 292.0  309  311.0     Lab Results  Component Value Date/Time   VD25OH 46.29 03/28/2021 10:56 AM   VD25OH 44.89 07/05/2020 12:33 PM    Clinical ASCVD: No  The ASCVD Risk score (Arnett DK, et al., 2019) failed to calculate for the following reasons:   The 2019 ASCVD risk score is only valid for ages 53 to 14       04/27/2021    8:44 AM 11/24/2020    9:34 AM 11/24/2019    2:11 PM  Depression screen PHQ 2/9  Decreased Interest 0 0 0  Down, Depressed, Hopeless 0 0 0  PHQ - 2 Score 0 0 0  Altered sleeping 0  0  Tired, decreased energy 1  0  Change in appetite 0  0  Feeling bad or failure about yourself  0  0  Trouble concentrating 0  0  Moving slowly or fidgety/restless 0  0  Suicidal thoughts 0  0  PHQ-9 Score 1  0  Difficult doing work/chores   Not difficult at all     Social History   Tobacco Use  Smoking Status Never  Smokeless Tobacco Never   BP Readings from Last 3 Encounters:  10/03/21 (!) 164/80  08/01/21 (!) 148/80  07/25/21 118/84   Pulse Readings from Last 3 Encounters:  10/03/21 70  08/01/21 70  07/25/21 72   Wt Readings from Last 3 Encounters:  10/03/21 152 lb 8 oz (69.2 kg)  08/01/21 153 lb 9.6 oz (69.7 kg)  07/25/21 154 lb 9.6 oz (70.1 kg)   BMI Readings from Last 3 Encounters:  10/03/21 30.80 kg/m  08/01/21 31.02 kg/m  07/25/21 31.23 kg/m    Assessment/Interventions: Review of patient past medical history, allergies, medications, health status, including review of consultants reports, laboratory and other test data, was performed as part of comprehensive evaluation and provision of chronic care management services.   SDOH:  (Social Determinants of Health) assessments and interventions  performed: No   SDOH Screenings   Alcohol Screen: Low Risk  (11/24/2019)   Alcohol Screen    Last Alcohol Screening Score (AUDIT): 0  Depression (PHQ2-9): Low Risk  (04/27/2021)   Depression (PHQ2-9)    PHQ-2 Score: 1  Financial Resource Strain: Low Risk  (02/02/2021)   Overall Financial Resource Strain (CARDIA)    Difficulty of Paying Living Expenses: Not hard at all  Food Insecurity: No Food Insecurity (11/24/2020)   Hunger Vital Sign    Worried About Running Out of Food in the Last Year: Never true    Ran Out of Food in the Last  Year: Never true  Housing: Low Risk  (11/24/2020)   Housing    Last Housing Risk Score: 0  Physical Activity: Inactive (11/24/2020)   Exercise Vital Sign    Days of Exercise per Week: 0 days    Minutes of Exercise per Session: 0 min  Social Connections: Socially Isolated (11/24/2020)   Social Connection and Isolation Panel [NHANES]    Frequency of Communication with Friends and Family: More than three times a week    Frequency of Social Gatherings with Friends and Family: Once a week    Attends Religious Services: Never    Marine scientist or Organizations: No    Attends Archivist Meetings: Never    Marital Status: Widowed  Stress: No Stress Concern Present (11/24/2020)   [place]    Feeling of Stress : Not at all  Tobacco Use: Low Risk  (10/03/2021)   Patient History    Smoking Tobacco Use: Never    Smokeless Tobacco Use: Never    Passive Exposure: Not on file  Transportation Needs: No Transportation Needs (02/02/2021)   PRAPARE - Transportation    Lack of Transportation (Medical): No    Lack of Transportation (Non-Medical): No   CCM Care Plan  Allergies  Allergen Reactions   Prilosec [Omeprazole] Cough   Amlodipine     Feet and ankle swelling   Atenolol     Did not help lower bp   Bystolic [Nebivolol Hcl]     Severe stomach pain   Chlorthalidone      Cramps, low back pain and itchy scalp   Clonidine Derivatives     fainting   Losartan     Did not lower bp   Protonix [Pantoprazole Sodium] Swelling   Amoxicillin Rash   Flagyl [Metronidazole] Rash    Medications Reviewed Today     Reviewed by [name], [facility] (Pharmacist) on 11/14/21 at 0904  Med List Status: <None>   Medication Order Taking? Sig Documenting Provider Last Dose Status Informant  acetaminophen (TYLENOL) 500 MG tablet [id number] No Take 500-1,000 mg by mouth every 6 (six) hours as needed for mild pain or headache.  [provider] Taking Active Self  AMBULATORY NON FORMULARY MEDICATION [id number]  Medication Name: GI Cocktail - 90 ml 2% Lidocaine, 90 ml Dicyclomine $RemoveBefore'10mg'[id number]$ /28ml, 270 ml Maalox - Take 5-10 ml every 6 hour as needed. Levin Erp, [place]  Active   carvedilol (COREG) 3.125 MG tablet [id number]  Take 1/2 tab PO BID [name], Junell C, MD  Active   cetirizine (ZYRTEC) 10 MG tablet [id number] No Take 10 mg by mouth daily. [provider] Taking Active Self  Cholecalciferol 25 MCG (1000 UT) TBDP [id number] No Take 1,000 Units by mouth daily. Renato Shin, MD Taking Active Self  clobetasol ointment (TEMOVATE) 0.05 % [id number] No Apply to affected area twice weekly. Princess Bruins, MD Taking Active   ezetimibe (ZETIA) 10 MG tablet [id number] No TAKE 1 BY MOUTH DAILY [name], Junell C, MD Taking Active   famotidine (PEPCID) 40 MG tablet [id number] No Take 1 tablet (40 mg total) by mouth 2 (two) times daily. Levin Erp, [place] Taking Active   furosemide (LASIX) 20 MG tablet [id number] No 1 tablet by mouth every other day as needed for swelling [name], MD Taking Active   hydrALAZINE (APRESOLINE) 25 MG tablet [id number] No Take 0.5 tablets (12.5 mg total) by mouth 2 (two) times daily. [name],  MD Taking Active   RABEprazole (ACIPHEX) 20 MG tablet [id number] No Take 1 tablet (20 mg total) by mouth in the morning and at  bedtime.  Patient taking differently: Take 20 mg by mouth as needed.   Levin Erp, [place] Taking Active   rosuvastatin (CRESTOR) 5 MG tablet [id number] No Take 0.5 tablets (2.5 mg total) by mouth daily. [name], MD Taking Active   spironolactone (ALDACTONE) 50 MG tablet [id number]  Take 0.5 tablets (25 mg total) by mouth 2 (two) times daily. [name], MD  Active             Patient Active Problem List   Diagnosis Date Noted   Osteoporosis of femur without pathological fracture 06/24/2019   Dyspnea 02/03/2019   Asthma 02/03/2019   Vertigo 06/26/2017   Hyperparathyroidism ([place]) 08/24/2014   Slow transit constipation 01/13/2014   IBS (irritable bowel syndrome), GERD, hx colon polyps, chronic abd pain - followed by Dr. Sharlett Iles in GI 06/11/2012   Osteopenia 08/29/2011   Vitamin D deficiency 08/29/2011   GERD (gastroesophageal reflux disease) 11/23/2010   Dyslipidemia 09/09/2007   Hypertension, essential, benign 09/09/2007    Immunization History  Administered Date(s) Administered   Fluad Quad(high Dose 65+) 03/04/2019, 02/02/2020, 03/28/2021   Influenza, High Dose Seasonal PF 03/23/2015, 02/22/2016, 02/13/2017, 03/11/2018   Influenza,inj,Quad PF,6+ Mos 03/07/2013, 03/03/2014   PFIZER(Purple Top)SARS-COV-2 Vaccination 06/21/2019, 07/12/2019, 03/30/2020, 12/03/2020   Pneumococcal Conjugate-13 08/10/2014   Pneumococcal Polysaccharide-23 07/23/2012   Tdap 07/23/2012   Zoster, Live 09/30/2013   Patient is still having some leg cramps and she isn't sure what this is coming from.  Patient is mostly having this at night and is trying to drink more water but doesn't enjoy it. She also doesn't enjoy flavored water.  Patient reports her BP is slightly higher in the evening than in the morning. Patient reports she feels her best when her readings are in the 130-140s and feels drained and hard to find energy with really low readings such as in the low  100s.  Conditions to be addressed/monitored:  Hypertension, Hyperlipidemia, GERD, Asthma, and Osteoporosis  Conditions addressed this visit: Hypertension, Hyperlipidemia  Care Plan : [place]  Updates made by [name], [place] since 11/14/2021 12:00 AM     Problem: Problem: Hypertension, Hyperlipidemia, GERD, Asthma, and Osteoporosis      Long-Range Goal: Patient-Specific Goal   Start Date: 01/24/2021  Expected End Date: 01/24/2021  Recent Progress: On track  Priority: High  Note:   Current Barriers:  Unable to achieve control of blood pressure   Pharmacist Clinical Goal(s):  Patient will achieve control of blood pressure as evidenced by home and office readings  through collaboration with PharmD and provider.   Interventions: 1:1 collaboration with [name], MD regarding development and update of comprehensive plan of care as evidenced by provider attestation and co-signature Inter-disciplinary care team collaboration (see longitudinal plan of care) Comprehensive medication review performed; medication list updated in electronic medical record  Hypertension (BP goal <140/90) -Uncontrolled -Current treatment: Spironolactone 25 mg 1 tablet twice daily - Appropriate, Effective, Safe, Accessible Hydralazine 25 mg 1/2 tablet three times daily - Appropriate, Effective, Safe, Accessible Carvedilol 3.125 mg 1/2 tablet twice daily - Appropriate, Effective, Safe, Accessible Furosemide 20 mg 1 tablet as needed - Appropriate, Effective, Safe, Accessible -Medications previously tried: chlorthalidone, clonidine, atenolol, hydralazine, amlodipine (swelling), lisinopril, candesartan, azilsartan, olmesartan (change to combo), labetalol (cramping), telmisartan (headaches), metoprolol (bradycardia) -Current home readings: 109/66  HR 75, 146/71 HR 73, 124/61 HR 67, 125/63 HR 70, 132/66 HR 58, 134/69 HR 65  -Current dietary habits: working on reading package  labels -Current exercise habits: does not routinely exercise -Reports hypotensive/hypertensive symptoms -Educated on Daily salt intake goal < 2300 mg; Exercise goal of 150 minutes per week; Importance of home blood pressure monitoring; Proper BP monitoring technique; Symptoms of hypotension and importance of maintaining adequate hydration; -Counseled to monitor BP at home daily, document, and provide log at future appointments -Counseled on diet and exercise extensively Recommended to continue current medication  Fluid retention (Goal: minimize swelling) -Controlled -Current treatment  Furosemide 20 mg 1 tablet as needed - Appropriate, Effective, Safe, Accessible -Medications previously tried: none  -Recommended to continue current medication   Hyperlipidemia: (LDL goal < 100) -Controlled -Current treatment: Zetia 10 mg 1 tablet daily - Appropriate, Effective, Safe, Accessible Rosuvastatin 5 mg 1/2 tablet daily - Appropriate, Effective, Safe, Accessible -Medications previously tried: rosuvastatin (cramping)  -Current dietary patterns: eats out some -Current exercise habits: does not routinely exercise -Educated on Cholesterol goals;  Benefits of statin for ASCVD risk reduction; Exercise goal of 150 minutes per week; -Recommended to continue current medication  Osteopenia (Goal prevent fractures) -Uncontrolled -Last DEXA Scan: 03/18/2019   T-Score femoral neck: -2.6  T-Score total hip: n/a  T-Score lumbar spine: -1.0  T-Score forearm radius: -2.7  10-year probability of major osteoporotic fracture: n/a  10-year probability of hip fracture: n/a -Patient is a candidate for pharmacologic treatment due to T-Score < -2.5 in femoral neck -Current treatment  Vitamin D 1000 units daily - Appropriate, Effective, Safe, Accessible -Medications previously tried: none  -Recommend [phone] units of vitamin D daily. Recommend weight-bearing and muscle strengthening exercises for building  and maintaining bone density. -Counseled on diet and exercise extensively Reassess at follow up.  GERD (Goal: minimize symptoms) -Controlled -Current treatment  Rabeprazole 20 mg 1 capsule twice daily as needed - Appropriate, Effective, Safe, Accessible -Medications previously tried: esomeprazole, pantoprazole (stomach upset), Dexilant (cost), omeprazole (cost) -Counseled on non-pharmacologic management of symptoms such as elevating the head of your bed, avoiding eating 2-3 hours before bed, avoiding triggering foods such as acidic, spicy, or fatty foods, eating smaller meals, and wearing clothes that are loose around the waist Patient reports her triggers include tomatoes and spicy foods.  Health Maintenance -Vaccine gaps: shingrix -Current therapy:  Cetirizine 10 mg 1 tablet daily (took when dizziness started) - vertigo Vitamin B12 - 1000 mcg - sublingual Acetaminophen 500 mg as needed -Educated on Cost vs benefit of each product must be carefully weighed by individual consumer -Patient is satisfied with current therapy and denies issues -Recommended to continue current medication  Patient Goals/Self-Care Activities Patient will:  - take medications as prescribed check blood pressure daily, document, and provide at future appointments target a minimum of 150 minutes of moderate intensity exercise weekly engage in dietary modifications by limiting salt intake  Follow Up Plan: Telephone follow up appointment with care management team member scheduled for:4 months       Medication Assistance: None required.  Patient affirms current coverage meets needs.  Compliance/Adherence/Medication fill history: Care Gaps: COVID booster, shingrix BP 164/80 10/03/21  [facility]: Rosuvastatin 5 mg - Last filled 07/26/21 90 DS at [facility]  Patient's preferred pharmacy is:  [facility] [place], [address] [address] [place] [address] Phone: [phone] Fax: [phone]  [facility] [place], [place] -  [place] [address] Phone: [phone] Fax: [phone]  [facility] [phone] [address], [address] AT [place] [address] Phone: [phone] Fax: [phone]   Uses pill box? Yes Pt endorses 100% compliance  We discussed: Current pharmacy is preferred with insurance plan and patient is satisfied with pharmacy services Patient decided to: Continue current medication management strategy  Care Plan and Follow Up Patient Decision:  Patient agrees to Care Plan and Follow-up.  Plan: Telephone follow up appointment with care management team member scheduled for:  4 months  Jeni Salles, PharmD, [place] Pharmacist [place] [facility] [phone]

## 2021-11-16 ENCOUNTER — Telehealth: Payer: Self-pay | Admitting: *Deleted

## 2021-11-16 ENCOUNTER — Other Ambulatory Visit: Payer: Self-pay | Admitting: Family

## 2021-11-16 DIAGNOSIS — E7800 Pure hypercholesterolemia, unspecified: Secondary | ICD-10-CM

## 2021-11-16 MED ORDER — EZETIMIBE 10 MG PO TABS: ORAL_TABLET | ORAL | 0 refills | Status: DC

## 2021-11-16 NOTE — Telephone Encounter (Signed)
Noted  

## 2021-11-16 NOTE — Telephone Encounter (Signed)
-----   Message from Kennyth Arnold, [place] sent at 11/16/2021 12:54 PM EDT ----- Regarding: RE: Zetia refill sure ----- Message ----- From: Agnes Lawrence, CMA Sent: 11/14/2021   3:09 PM EDT To: Kennyth Arnold, FNP Subject: FW: Zetia refill                                ----- Message ----- From: [name], [facility] Sent: 11/14/2021   8:54 AM EDT To: Gretchen Portela Subject: Zetia refill                                   Hi,  Ms. Rogacki is running low on her ezetimibe. Can you please send a refill for a 90 ds to her mail order pharmacy?  Thank you! Maddie

## 2021-11-25 MED ORDER — DICYCLOMINE HCL 10 MG PO CAPS: 10.0000 mg | ORAL_CAPSULE | Freq: Three times a day (TID) | ORAL | 0 refills | Status: DC

## 2021-11-28 ENCOUNTER — Ambulatory Visit (INDEPENDENT_AMBULATORY_CARE_PROVIDER_SITE_OTHER): Payer: PPO

## 2021-11-28 VITALS — Ht 59.0 in | Wt 152.0 lb

## 2021-11-28 DIAGNOSIS — Z0000 Encounter for general adult medical examination without abnormal findings: Secondary | ICD-10-CM

## 2021-11-28 NOTE — Progress Notes (Signed)
Subjective:   Robin [name] is a 88 y.o. female who presents for Medicare Annual (Subsequent) preventive examination.  Review of Systems    Virtual Visit via Telephone Note  I connected with  Robin [name] on 11/28/21 at  9:30 AM EDT by telephone and verified that I am speaking with the correct person using two identifiers.  Location: Patient: Home Provider: Office Persons participating in the virtual visit: patient/Nurse Health Advisor   I discussed the limitations, risks, security and privacy concerns of performing an evaluation and management service by telephone and the availability of in person appointments. The patient expressed understanding and agreed to proceed.  Interactive audio and video telecommunications were attempted between this nurse and patient, however failed, due to patient having technical difficulties OR patient did not have access to video capability.  We continued and completed visit with audio only.  Some vital signs may be absent or patient reported.   Criselda Peaches, LPN  Cardiac Risk Factors include: advanced age (>24mn, >>80women);hypertension     Objective:    Today's Vitals   11/28/21 0935  Weight: 152 lb (68.9 kg)  Height: '4\' 11"'$  (1.499 m)   Body mass index is 30.7 kg/m.     11/28/2021    9:42 AM 04/23/2021    6:43 AM 11/24/2020    9:35 AM 11/24/2019    2:09 PM 10/31/2018   11:32 AM 07/15/2018    2:55 PM 10/02/2017   10:25 AM  Advanced Directives  Does Patient Have a Medical Advance Directive? No No No No Yes No Yes  Type of Advance Directive     Living will  Living will  Does patient want to make changes to medical advance directive?     No - Patient declined  No - Patient declined  Would patient like information on creating a medical advance directive? No - Patient declined  No - Patient declined No - Patient declined  No - Patient declined     Current Medications (verified) Outpatient Encounter Medications as of 11/28/2021   Medication Sig   acetaminophen (TYLENOL) 500 MG tablet Take 500-1,000 mg by mouth every 6 (six) hours as needed for mild pain or headache.    AMBULATORY NON FORMULARY MEDICATION Medication Name: GI Cocktail - 90 ml 2% Lidocaine, 90 ml Dicyclomine '10mg'$ /582m 270 ml Maalox - Take 5-10 ml every 6 hour as needed.   carvedilol (COREG) 3.125 MG tablet Take 1/2 tab PO BID   cetirizine (ZYRTEC) 10 MG tablet Take 10 mg by mouth daily.   Cholecalciferol 25 MCG (1000 UT) TBDP Take 1,000 Units by mouth daily.   clobetasol ointment (TEMOVATE) 0.05 % Apply to affected area twice weekly.   dicyclomine (BENTYL) 10 MG capsule Take 1 capsule (10 mg total) by mouth 4 (four) times daily -  before meals and at bedtime. For abdominal pain   ezetimibe (ZETIA) 10 MG tablet TAKE 1 BY MOUTH DAILY   famotidine (PEPCID) 40 MG tablet Take 1 tablet (40 mg total) by mouth 2 (two) times daily.   furosemide (LASIX) 20 MG tablet 1 tablet by mouth every other day as needed for swelling   hydrALAZINE (APRESOLINE) 25 MG tablet Take 0.5 tablets (12.5 mg total) by mouth 2 (two) times daily.   RABEprazole (ACIPHEX) 20 MG tablet Take 1 tablet (20 mg total) by mouth in the morning and at bedtime. (Patient taking differently: Take 20 mg by mouth as needed.)   rosuvastatin (CRESTOR) 5 MG tablet Take  0.5 tablets (2.5 mg total) by mouth daily.   spironolactone (ALDACTONE) 50 MG tablet Take 0.5 tablets (25 mg total) by mouth 2 (two) times daily.   No facility-administered encounter medications on file as of 11/28/2021.    Allergies (verified) Prilosec [omeprazole], Amlodipine, Atenolol, Bystolic [nebivolol hcl], Chlorthalidone, Clonidine derivatives, Losartan, Protonix [pantoprazole sodium], Amoxicillin, and Flagyl [metronidazole]   History: Past Medical History:  Diagnosis Date   Abnormal Pap smear of cervix    Allergy    Arthritis    Asthma    Cataract 2018   bilateral eyes   Colon polyps    tubulovillous adenoma, 2010    External hemorrhoids without mention of complication    GERD (gastroesophageal reflux disease)    Headache    eval with neuro in 2011   Hyperlipemia    Hyperparathyroidism ([place])    seeing Dr. Dwyane Dee in endocrinology   Hypertension    IBS (irritable bowel syndrome), GERD, hx colon polyps, chronic abd pain - followed by Dr. Sharlett Iles in GI 06/11/2012   Osteoporosis 08/29/2011   Vertigo    eval with neuro in 2011, brief recurrence 2016   Past Surgical History:  Procedure Laterality Date   BACK SURGERY  1974   CARPAL TUNNEL RELEASE Left 06/2008   COLONOSCOPY     EYE SURGERY  07/2017   bilateral cataract extraction   POLYPECTOMY     TONSILLECTOMY     UPPER GASTROINTESTINAL ENDOSCOPY     Family History  Problem Relation Age of Onset   Diabetes Brother    Hypertension Brother    Stroke Brother 43   Hypertension Father    Stroke Father    Diabetes Sister    Hypertension Sister    Heart disease Sister        heart failure   CAD Mother        Died of MI at age 72   Hypertension Mother    CAD Brother        Died of MI at age 28   Hypertension Brother    Stroke Sister 1   Hypertension Sister    Stroke Brother 61   Hypertension Brother    Colon cancer Neg Hx    Colon polyps Neg Hx    Esophageal cancer Neg Hx    Pancreatic cancer Neg Hx    Stomach cancer Neg Hx    Rectal cancer Neg Hx    Social History   Socioeconomic History   Marital status: Widowed    Spouse name: Not on file   Number of children: 2   Years of education: Not on file   Highest education level: Not on file  Occupational History   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  Tobacco Use   Smoking status: Never   Smokeless tobacco: Never  Vaping Use   Vaping Use: Never used  Substance and Sexual Activity   Alcohol use: No    Alcohol/week: 0.0 standard drinks of alcohol   Drug use: No   Sexual activity: Not Currently    Birth control/protection: Post-menopausal  Other  Topics Concern   Not on file  Social History Narrative   Lives alone in a one story home.  Has 2 children, one son and one daughter with 2 grandkids and 1 great-grandchild.    Retired from working for a bank.     Education: high school.    Social Determinants of Health   Financial Resource Strain:  Low Risk  (11/28/2021)   Overall Financial Resource Strain (CARDIA)    Difficulty of Paying Living Expenses: Not hard at all  Food Insecurity: No Food Insecurity (11/28/2021)   Hunger Vital Sign    Worried About Running Out of Food in the Last Year: Never true    Ran Out of Food in the Last Year: Never true  Transportation Needs: No Transportation Needs (11/28/2021)   PRAPARE - Hydrologist (Medical): No    Lack of Transportation (Non-Medical): No  Physical Activity: Inactive (11/28/2021)   Exercise Vital Sign    Days of Exercise per Week: 0 days    Minutes of Exercise per Session: 0 min  Stress: No Stress Concern Present (11/28/2021)   [place]    Feeling of Stress : Not at all  Social Connections: Moderately Isolated (11/28/2021)   Social Connection and Isolation Panel [NHANES]    Frequency of Communication with Friends and Family: More than three times a week    Frequency of Social Gatherings with Friends and Family: More than three times a week    Attends Religious Services: Never    Marine scientist or Organizations: Yes    Attends Archivist Meetings: Never    Marital Status: Widowed    Tobacco Counseling Counseling given: Not Answered   Clinical Intake:  Pre-visit preparation completed: No  Pain : No/denies pain     BMI - recorded: 30.79 Nutritional Status: BMI > 30  Obese Nutritional Risks: None Diabetes: No  How often do you need to have someone help you when you read instructions, pamphlets, or other written materials from your doctor or pharmacy?: 1 -  Never  Diabetic?  No   Activities of Daily Living    11/28/2021    9:40 AM  In your present state of health, do you have any difficulty performing the following activities:  Hearing? 0  Vision? 0  Difficulty concentrating or making decisions? 0  Walking or climbing stairs? 0  Dressing or bathing? 0  Doing errands, shopping? 0  Preparing Food and eating ? N  Using the Toilet? N  In the past six months, have you accidently leaked urine? N  Do you have problems with loss of bowel control? N  Managing your Medications? N  Managing your Finances? N  Housekeeping or managing your Housekeeping? N    Patient Care Team: [name], MD (Inactive) as PCP - General (Family Medicine) [name] [name], MD as PCP - Cardiology (Cardiology) [name], MD as Consulting Physician (Gastroenterology) [name] [name], MD as Consulting Physician (Cardiology) [name], MD as Consulting Physician (Ophthalmology) [name], MD as Consulting Physician (Gynecology) [name], [facility] as Pharmacist (Pharmacist)  Indicate any recent Medical Services you may have received from other than [facility] providers in the past year (date may be approximate).     Assessment:   This is a routine wellness examination for Robin [name].  Hearing/Vision screen Hearing Screening - Comments:: No hearing difficulty Vision Screening - Comments:: Wear reading glasses. Followed by Dr Satira Sark  Dietary issues and exercise activities discussed: Exercise limited by: None identified   Goals Addressed               This Visit's Progress     No current goals (pt-stated)         Depression Screen    11/28/2021    9:38 AM 04/27/2021  8:44 AM 11/24/2020    9:34 AM 11/24/2019    2:11 PM 10/22/2018   10:53 AM 07/15/2018    2:56 PM 02/13/2017   10:17 AM  PHQ 2/9 Scores  PHQ - 2 Score 0 0 0 0 0 0 0  PHQ- 9 Score  1  0  0     Fall Risk    11/28/2021    9:41 AM 11/24/2020    9:36 AM 11/24/2019     2:10 PM 07/15/2018    2:56 PM 07/15/2018   10:07 AM  Fall Risk   Falls in the past year? 0 0 0 0 0  Number falls in past yr: 0 0 0  0  Injury with Fall? 0 0 0  0  Risk for fall due to : No Fall Risks Impaired vision Medication side effect    Follow up  Falls prevention discussed Falls evaluation completed;Falls prevention discussed      FALL RISK PREVENTION PERTAINING TO THE HOME:  Any stairs in or around the home? No  If so, are there any without handrails? No  Home free of loose throw rugs in walkways, pet beds, electrical cords, etc? Yes  Adequate lighting in your home to reduce risk of falls? Yes   ASSISTIVE DEVICES UTILIZED TO PREVENT FALLS:  Life alert? No Use of a cane, walker or w/c? No  Grab bars in the bathroom? No  Shower chair or bench in shower? No  Elevated toilet seat or a handicapped toilet? No   TIMED UP AND GO:  Was the test performed? No . Audio Visit  Cognitive Function:        11/28/2021    9:42 AM 11/24/2020    9:37 AM 11/24/2019    2:14 PM  6CIT Screen  What Year? 0 points 0 points 0 points  What month? 0 points 0 points 0 points  What time? 0 points 0 points 0 points  Count back from 20 0 points 0 points 0 points  Months in reverse 0 points 0 points 0 points  Repeat phrase 0 points 0 points 0 points  Total Score 0 points 0 points 0 points    Immunizations Immunization History  Administered Date(s) Administered   Fluad Quad(high Dose 65+) 03/04/2019, 02/02/2020, 03/28/2021   Influenza, High Dose Seasonal PF 03/23/2015, 02/22/2016, 02/13/2017, 03/11/2018   Influenza,inj,Quad PF,6+ Mos 03/07/2013, 03/03/2014   PFIZER(Purple Top)SARS-COV-2 Vaccination 06/21/2019, 07/12/2019, 03/30/2020, 12/03/2020   Pneumococcal Conjugate-13 08/10/2014   Pneumococcal Polysaccharide-23 07/23/2012   Tdap 07/23/2012   Zoster, Live 09/30/2013    TDAP status: Up to date  Flu Vaccine status: Up to date  Pneumococcal vaccine status: Up to date  Covid-19  vaccine status: Completed vaccines  Qualifies for Shingles Vaccine? Yes   Zostavax completed No   Shingrix Completed?: No.    Education has been provided regarding the importance of this vaccine. Patient has been advised to call insurance company to determine out of pocket expense if they have not yet received this vaccine. Advised may also receive vaccine at local pharmacy or Health Dept. Verbalized acceptance and understanding.  Screening Tests Health Maintenance  Topic Date Due   COVID-19 Vaccine (5 - Pfizer series) 12/14/2021 (Originally 01/28/2021)   Zoster Vaccines- Shingrix (1 of 2) 01/03/2022 (Originally [birth year]1985)   INFLUENZA VACCINE  12/13/2021   TETANUS/TDAP  07/24/2022   Pneumonia Vaccine 70+ Years old  Completed   DEXA SCAN  Completed   HPV VACCINES  Aged Out    Health  Maintenance  There are no preventive care reminders to display for this patient.   Colorectal cancer screening: No longer required.   Mammogram status: No longer required due to Age.  Bone Density status: Completed 03/18/19. Results reflect: Bone density results: OSTEOPOROSIS. Repeat every   years.  Lung Cancer Screening: (Low Dose CT Chest recommended if Age 28-80 years, 30 pack-year currently smoking OR have quit w/in 15years.) does not qualify.     Additional Screening:  Hepatitis C Screening: does not qualify;  Vision Screening: Recommended annual ophthalmology exams for early detection of glaucoma and other disorders of the eye. Is the patient up to date with their annual eye exam?  Yes  Who is the provider or what is the name of the office in which the patient attends annual eye exams? Dr Satira Sark If pt is not established with a provider, would they like to be referred to a provider to establish care? No .   Dental Screening: Recommended annual dental exams for proper oral hygiene  Community Resource Referral / Chronic Care Management:  CRR required this visit?  No   CCM required this  visit?  No      Plan:     I have personally reviewed and noted the following in the patient's chart:   Medical and social history Use of alcohol, tobacco or illicit drugs  Current medications and supplements including opioid prescriptions.  Functional ability and status Nutritional status Physical activity Advanced directives List of other physicians Hospitalizations, surgeries, and ER visits in previous 12 months Vitals Screenings to include cognitive, depression, and falls Referrals and appointments  In addition, I have reviewed and discussed with patient certain preventive protocols, quality metrics, and best practice recommendations. A written personalized care plan for preventive services as well as general preventive health recommendations were provided to patient.     Criselda Peaches, LPN   08/15/4740   Nurse Notes: None

## 2021-11-28 NOTE — Patient Instructions (Addendum)
Ms. Robin [name] , Thank you for taking time to come for your Medicare Wellness Visit. I appreciate your ongoing commitment to your health goals. Please review the following plan we discussed and let me know if I can assist you in the future.   These are the goals we discussed:  Goals       Exercise 150 minutes per week (moderate activity)      3 days- 40 minutes  May call the community center to see if they have a walking program and can designate a safe time to walk       No current goals (pt-stated)      Patient Stated      Remain as independent as possible; keep free of infections      Patient Stated      I will continue to take my medications as prescribed      Patient Stated      I will try to drink more water daily       Patient Stated      None at this time        This is a list of the screening recommended for you and due dates:  Health Maintenance  Topic Date Due   COVID-19 Vaccine (5 - Pfizer series) 12/14/2021*   Zoster (Shingles) Vaccine (1 of 2) 01/03/2022*   Flu Shot  12/13/2021   Tetanus Vaccine  07/24/2022   Pneumonia Vaccine  Completed   DEXA scan (bone density measurement)  Completed   HPV Vaccine  Aged Out  *Topic was postponed. The date shown is not the original due date.    Advanced directives: No  Conditions/risks identified: None  Next appointment: Follow up in one year for your annual wellness visit     Preventive Care 65 Years and Older, Female Preventive care refers to lifestyle choices and visits with your health care provider that can promote health and wellness. What does preventive care include? A yearly physical exam. This is also called an annual well check. Dental exams once or twice a year. Routine eye exams. Ask your health care provider how often you should have your eyes checked. Personal lifestyle choices, including: Daily care of your teeth and gums. Regular physical activity. Eating a healthy diet. Avoiding tobacco and drug  use. Limiting alcohol use. Practicing safe sex. Taking low-dose aspirin every day. Taking vitamin and mineral supplements as recommended by your health care provider. What happens during an annual well check? The services and screenings done by your health care provider during your annual well check will depend on your age, overall health, lifestyle risk factors, and family history of disease. Counseling  Your health care provider may ask you questions about your: Alcohol use. Tobacco use. Drug use. Emotional well-being. Home and relationship well-being. Sexual activity. Eating habits. History of falls. Memory and ability to understand (cognition). Work and work Statistician. Reproductive health. Screening  You may have the following tests or measurements: Height, weight, and BMI. Blood pressure. Lipid and cholesterol levels. These may be checked every 5 years, or more frequently if you are over 70 years old. Skin check. Lung cancer screening. You may have this screening every year starting at age 5 if you have a 30-pack-year history of smoking and currently smoke or have quit within the past 15 years. Fecal occult blood test (FOBT) of the stool. You may have this test every year starting at age 86. Flexible sigmoidoscopy or colonoscopy. You may have a sigmoidoscopy every 5  years or a colonoscopy every 10 years starting at age 54. Hepatitis C blood test. Hepatitis B blood test. Sexually transmitted disease (STD) testing. Diabetes screening. This is done by checking your blood sugar (glucose) after you have not eaten for a while (fasting). You may have this done every 1-3 years. Bone density scan. This is done to screen for osteoporosis. You may have this done starting at age 89. Mammogram. This may be done every 1-2 years. Talk to your health care provider about how often you should have regular mammograms. Talk with your health care provider about your test results, treatment  options, and if necessary, the need for more tests. Vaccines  Your health care provider may recommend certain vaccines, such as: Influenza vaccine. This is recommended every year. Tetanus, diphtheria, and acellular pertussis (Tdap, Td) vaccine. You may need a Td booster every 10 years. Zoster vaccine. You may need this after age 90. Pneumococcal 13-valent conjugate (PCV13) vaccine. One dose is recommended after age 50. Pneumococcal polysaccharide (PPSV23) vaccine. One dose is recommended after age 50. Talk to your health care provider about which screenings and vaccines you need and how often you need them. This information is not intended to replace advice given to you by your health care provider. Make sure you discuss any questions you have with your health care provider. Document Released: 05/28/2015 Document Revised: 01/19/2016 Document Reviewed: 03/02/2015 Elsevier Interactive Patient Education  2017 [place] Prevention in the Home Falls can cause injuries. They can happen to people of all ages. There are many things you can do to make your home safe and to help prevent falls. What can I do on the outside of my home? Regularly fix the edges of walkways and driveways and fix any cracks. Remove anything that might make you trip as you walk through a door, such as a raised step or threshold. Trim any bushes or trees on the path to your home. Use bright outdoor lighting. Clear any walking paths of anything that might make someone trip, such as rocks or tools. Regularly check to see if handrails are loose or broken. Make sure that both sides of any steps have handrails. Any raised decks and porches should have guardrails on the edges. Have any leaves, snow, or ice cleared regularly. Use sand or salt on walking paths during winter. Clean up any spills in your garage right away. This includes oil or grease spills. What can I do in the bathroom? Use night lights. Install grab  bars by the toilet and in the tub and shower. Do not use towel bars as grab bars. Use non-skid mats or decals in the tub or shower. If you need to sit down in the shower, use a plastic, non-slip stool. Keep the floor dry. Clean up any water that spills on the floor as soon as it happens. Remove soap buildup in the tub or shower regularly. Attach bath mats securely with double-sided non-slip rug tape. Do not have throw rugs and other things on the floor that can make you trip. What can I do in the bedroom? Use night lights. Make sure that you have a light by your bed that is easy to reach. Do not use any sheets or blankets that are too big for your bed. They should not hang down onto the floor. Have a firm chair that has side arms. You can use this for support while you get dressed. Do not have throw rugs and other things on the  floor that can make you trip. What can I do in the kitchen? Clean up any spills right away. Avoid walking on wet floors. Keep items that you use a lot in easy-to-reach places. If you need to reach something above you, use a strong step stool that has a grab bar. Keep electrical cords out of the way. Do not use floor polish or wax that makes floors slippery. If you must use wax, use non-skid floor wax. Do not have throw rugs and other things on the floor that can make you trip. What can I do with my stairs? Do not leave any items on the stairs. Make sure that there are handrails on both sides of the stairs and use them. Fix handrails that are broken or loose. Make sure that handrails are as long as the stairways. Check any carpeting to make sure that it is firmly attached to the stairs. Fix any carpet that is loose or worn. Avoid having throw rugs at the top or bottom of the stairs. If you do have throw rugs, attach them to the floor with carpet tape. Make sure that you have a light switch at the top of the stairs and the bottom of the stairs. If you do not have them,  ask someone to add them for you. What else can I do to help prevent falls? Wear shoes that: Do not have high heels. Have rubber bottoms. Are comfortable and fit you well. Are closed at the toe. Do not wear sandals. If you use a stepladder: Make sure that it is fully opened. Do not climb a closed stepladder. Make sure that both sides of the stepladder are locked into place. Ask someone to hold it for you, if possible. Clearly mark and make sure that you can see: Any grab bars or handrails. First and last steps. Where the edge of each step is. Use tools that help you move around (mobility aids) if they are needed. These include: Canes. Walkers. Scooters. Crutches. Turn on the lights when you go into a dark area. Replace any light bulbs as soon as they burn out. Set up your furniture so you have a clear path. Avoid moving your furniture around. If any of your floors are uneven, fix them. If there are any pets around you, be aware of where they are. Review your medicines with your doctor. Some medicines can make you feel dizzy. This can increase your chance of falling. Ask your doctor what other things that you can do to help prevent falls. This information is not intended to replace advice given to you by your health care provider. Make sure you discuss any questions you have with your health care provider. Document Released: 02/25/2009 Document Revised: 10/07/2015 Document Reviewed: 06/05/2014 Elsevier Interactive Patient Education  2017 [employer].

## 2021-12-26 ENCOUNTER — Encounter: Payer: Self-pay | Admitting: Physician Assistant

## 2021-12-26 ENCOUNTER — Other Ambulatory Visit (INDEPENDENT_AMBULATORY_CARE_PROVIDER_SITE_OTHER): Payer: PPO

## 2021-12-26 ENCOUNTER — Encounter: Payer: PPO | Admitting: Family Medicine

## 2021-12-26 ENCOUNTER — Ambulatory Visit: Payer: PPO | Admitting: Physician Assistant

## 2021-12-26 VITALS — BP 140/80 | HR 65 | Ht 59.0 in | Wt 154.8 lb

## 2021-12-26 DIAGNOSIS — R1013 Epigastric pain: Secondary | ICD-10-CM

## 2021-12-26 DIAGNOSIS — R194 Change in bowel habit: Secondary | ICD-10-CM

## 2021-12-26 DIAGNOSIS — R1084 Generalized abdominal pain: Secondary | ICD-10-CM

## 2021-12-26 LAB — BASIC METABOLIC PANEL
BUN: 17 mg/dL (ref 6–23)
CO2: 27 mEq/L (ref 19–32)
Calcium: 11.1 mg/dL — ABNORMAL HIGH (ref 8.4–10.5)
Chloride: 104 mEq/L (ref 96–112)
Creatinine, Ser: 1.04 mg/dL (ref 0.40–1.20)
GFR: 48.84 mL/min — ABNORMAL LOW (ref >60.00)
Glucose, Bld: 86 mg/dL (ref 70–99)
Potassium: 4.5 mEq/L (ref 3.5–5.1)
Sodium: 139 mEq/L (ref 135–145)

## 2021-12-26 MED ORDER — ESOMEPRAZOLE MAGNESIUM 40 MG PO CPDR: 40.0000 mg | DELAYED_RELEASE_CAPSULE | Freq: Two times a day (BID) | ORAL | 2 refills | Status: DC

## 2021-12-26 NOTE — Progress Notes (Signed)
Chief Complaint: "Stomach issues"  HPI:    [name] here is an 88 year old female with a past medical history as listed below, known to Dr. Fuller Plan, who returns to clinic today for continued "stomach issues".    Patient has been seen multiple times in the past for epigastric abdominal pain, please see all of those notes.    07/16/2020 EGD for dysphagia and globus sensation with benign-appearing esophageal stenosis dilated to 17 mm, 3 gastric polyps which were biopsied and small hiatal hernia.  Pathology showed fundic gland polyps.     07/25/2021 patient described she was still battling epigastric pain which was constantly there along with increased eructations.  She was on Carafate 4 times daily but this never helped.  Her PCP did start her on Pepcid 40 mg twice a day on that had not changed anything either.  She continued to blame her Bystolic.  Also discussed some constipation issues which had resolved since using MiraLAX.  At that time stop patient's Nexium prescribed Dexilant 60 mg daily.  Also refilled Famotidine 40 mg twice daily and continued GI cocktail.  Explained that if she had no change in symptoms then would recommend CT versus EGD.    11/24/2021 patient describes she had ongoing stomach issues that were not better after taking the "cocktail".  At that time recommended Dicyclomine 20 mg as needed.    Today, the patient presents to clinic accompanied by her daughter, and tells me that she continues with her chronic epigastric complaints.  These have now been going on for at least the past 8 months or more.  Initially thought related to Bystolic but patient has not been on that medicine now for months and has tried various GI products most recently a switch to [place] which she does not feel like is helping at all.  Tells me she has a daily bowel movement sometimes more complete than others, she had a good bowel movement this morning but does not feel like that helped her pain at all.  Describes  generalized abdominal tenderness and also pain that sometimes wraps around her back to her right side and up under her shoulder blades on either side.  She would like to switch back to Nexium and stop Pepcid.  She never tried the Dicyclomine for fear of side effects.    Denies fever, chills, weight loss or blood in her stool.  Past Medical History:  Diagnosis Date   Abnormal Pap smear of cervix    Allergy    Arthritis    Asthma    Cataract 2018   bilateral eyes   Colon polyps    tubulovillous adenoma, 2010   External hemorrhoids without mention of complication    GERD (gastroesophageal reflux disease)    Headache    eval with neuro in 2011   Hyperlipemia    Hyperparathyroidism ([place])    seeing Dr. Dwyane Dee in endocrinology   Hypertension    IBS (irritable bowel syndrome), GERD, hx colon polyps, chronic abd pain - followed by Dr. Sharlett Iles in GI 06/11/2012   Osteoporosis 08/29/2011   Vertigo    eval with neuro in 2011, brief recurrence 2016    Past Surgical History:  Procedure Laterality Date   [place] Left 06/2008   COLONOSCOPY     EYE SURGERY  07/2017   bilateral cataract extraction   POLYPECTOMY     TONSILLECTOMY     UPPER GASTROINTESTINAL ENDOSCOPY  Current Outpatient Medications  Medication Sig Dispense Refill   acetaminophen (TYLENOL) 500 MG tablet Take 500-1,000 mg by mouth every 6 (six) hours as needed for mild pain or headache.      AMBULATORY NON FORMULARY MEDICATION Medication Name: GI Cocktail - 90 ml 2% Lidocaine, 90 ml Dicyclomine '10mg'$ /65m, 270 ml Maalox - Take 5-10 ml every 6 hour as needed. 450 mL 3   carvedilol (COREG) 3.125 MG tablet Take 1/2 tab PO BID 180 tablet 3   cetirizine (ZYRTEC) 10 MG tablet Take 10 mg by mouth daily.     Cholecalciferol 25 MCG (1000 UT) TBDP Take 1,000 Units by mouth daily. 90 tablet 3   clobetasol ointment (TEMOVATE) 0.05 % Apply to affected area twice weekly. 60 g 4   dicyclomine (BENTYL) 10  MG capsule Take 1 capsule (10 mg total) by mouth 4 (four) times daily -  before meals and at bedtime. For abdominal pain 120 capsule 0   ezetimibe (ZETIA) 10 MG tablet TAKE 1 BY MOUTH DAILY 90 tablet 0   famotidine (PEPCID) 40 MG tablet Take 1 tablet (40 mg total) by mouth 2 (two) times daily. 180 tablet 3   furosemide (LASIX) 20 MG tablet 1 tablet by mouth every other day as needed for swelling 45 tablet 3   hydrALAZINE (APRESOLINE) 25 MG tablet Take 0.5 tablets (12.5 mg total) by mouth 2 (two) times daily. 30 tablet 2   RABEprazole (ACIPHEX) 20 MG tablet Take 1 tablet (20 mg total) by mouth in the morning and at bedtime. (Patient taking differently: Take 20 mg by mouth as needed.) 60 tablet 2   rosuvastatin (CRESTOR) 5 MG tablet Take 0.5 tablets (2.5 mg total) by mouth daily. 45 tablet 3   spironolactone (ALDACTONE) 50 MG tablet Take 0.5 tablets (25 mg total) by mouth 2 (two) times daily. 180 tablet 2   No current facility-administered medications for this visit.    Allergies as of 12/26/2021 - Review Complete 11/28/2021  Allergen Reaction Noted   Prilosec [omeprazole] Cough 08/16/2016   Amlodipine  02/12/2019   Atenolol  167/20/9470  Bystolic [nebivolol hcl]  04/26/2021   Chlorthalidone  04/26/2021   Clonidine derivatives  04/26/2021   Losartan  04/26/2021   Protonix [pantoprazole sodium] Swelling 03/14/2016   Amoxicillin Rash 04/28/2009   Flagyl [metronidazole] Rash 04/28/2009    Family History  Problem Relation Age of Onset   Diabetes Brother    Hypertension Brother    Stroke Brother 676  Hypertension Father    Stroke Father    Diabetes Sister    Hypertension Sister    Heart disease Sister        heart failure   CAD Mother        Died of MI at age 86  Hypertension Mother    CAD Brother        Died of MI at age 561  Hypertension Brother    Stroke Sister 851  Hypertension Sister    Stroke Brother 679  Hypertension Brother    Colon cancer Neg Hx    Colon polyps Neg  Hx    Esophageal cancer Neg Hx    Pancreatic cancer Neg Hx    Stomach cancer Neg Hx    Rectal cancer Neg Hx     Social History   Socioeconomic History   Marital status: Widowed    Spouse name: Not on file   Number of children: 2   Years of education:  Not on file   Highest education level: Not on file  Occupational History   Occupation: Bank management    Comment: retired    Fish farm manager: RETIRED  Tobacco Use   Smoking status: Never   Smokeless tobacco: Never  Vaping Use   Vaping Use: Never used  Substance and Sexual Activity   Alcohol use: No    Alcohol/week: 0.0 standard drinks of alcohol   Drug use: No   Sexual activity: Not Currently    Birth control/protection: Post-menopausal  Other Topics Concern   Not on file  Social History Narrative   Lives alone in a one story home.  Has 2 children, one son and one daughter with 2 grandkids and 1 great-grandchild.    Retired from working for a bank.     Education: high school.    Social Determinants of Health   Financial Resource Strain: Low Risk  (11/28/2021)   Overall Financial Resource Strain (CARDIA)    Difficulty of Paying Living Expenses: Not hard at all  Food Insecurity: No Food Insecurity (11/28/2021)   Hunger Vital Sign    Worried About Running Out of Food in the Last Year: Never true    Ran Out of Food in the Last Year: Never true  Transportation Needs: No Transportation Needs (11/28/2021)   PRAPARE - Hydrologist (Medical): No    Lack of Transportation (Non-Medical): No  Physical Activity: Inactive (11/28/2021)   Exercise Vital Sign    Days of Exercise per Week: 0 days    Minutes of Exercise per Session: 0 min  Stress: No Stress Concern Present (11/28/2021)   [place]    Feeling of Stress : Not at all  Social Connections: Moderately Isolated (11/28/2021)   Social Connection and Isolation Panel [NHANES]    Frequency of  Communication with Friends and Family: More than three times a week    Frequency of Social Gatherings with Friends and Family: More than three times a week    Attends Religious Services: Never    Marine scientist or Organizations: Yes    Attends Archivist Meetings: Never    Marital Status: Widowed  Intimate Partner Violence: Not At Risk (11/28/2021)   Humiliation, Afraid, Rape, and Kick questionnaire    Fear of Current or Ex-Partner: No    Emotionally Abused: No    Physically Abused: No    Sexually Abused: No    Review of Systems:    Constitutional: No weight loss, fever or chills Cardiovascular: No chest pain  Respiratory: No SOB  Gastrointestinal: See HPI and otherwise negative   Physical Exam:  Vital signs: BP (!) 176/84   Pulse 65   Ht '4\' 11"'$  (1.499 m)   Wt 154 lb 12.8 oz (70.2 kg)   BMI 31.27 kg/m    Constitutional:   Pleasant Elderly Caucasian female appears to be in NAD, Well developed, Well nourished, alert and cooperative Respiratory: Respirations even and unlabored. Lungs clear to auscultation bilaterally.   No wheezes, crackles, or rhonchi.  Cardiovascular: Normal S1, S2. No MRG. Regular rate and rhythm. No peripheral edema, cyanosis or pallor.  Gastrointestinal:  Soft, nondistended, mild to moderate generalized TTP some worse in the epigastrium with involuntary guarding.  Increased bowel sounds all 4 quadrants. No appreciable masses or hepatomegaly. Rectal:  Not performed.  Psychiatric: Oriented to person, place and time. Demonstrates good judgement and reason without abnormal affect or behaviors.  RELEVANT  LABS AND IMAGING: CBC    Component Value Date/Time   WBC 7.1 10/03/2021 1022   RBC 4.02 10/03/2021 1022   HGB 12.7 10/03/2021 1022   HCT 37.8 10/03/2021 1022   PLT 292.0 10/03/2021 1022   MCV 94.2 10/03/2021 1022   MCH 30.7 04/23/2021 1221   MCHC 33.5 10/03/2021 1022   RDW 13.2 10/03/2021 1022   LYMPHSABS 1.3 10/03/2021 1022   MONOABS  0.4 10/03/2021 1022   EOSABS 0.1 10/03/2021 1022   BASOSABS 0.0 10/03/2021 1022    CMP     Component Value Date/Time   NA 139 10/03/2021 1022   NA 144 03/16/2020 1152   K 4.2 10/03/2021 1022   CL 103 10/03/2021 1022   CO2 29 10/03/2021 1022   GLUCOSE 90 10/03/2021 1022   BUN 19 10/03/2021 1022   BUN 14 03/16/2020 1152   CREATININE 1.08 10/03/2021 1022   CREATININE 0.91 (H) 02/02/2020 1000   CALCIUM 11.2 (H) 10/03/2021 1022   CALCIUM 11.4 (H) 10/03/2021 1022   PROT 7.1 10/03/2021 1022   ALBUMIN 4.5 10/03/2021 1022   AST 17 10/03/2021 1022   ALT 13 10/03/2021 1022   ALKPHOS 68 10/03/2021 1022   BILITOT 0.7 10/03/2021 1022   GFRNONAA >60 04/23/2021 1221   GFRAA 74 03/16/2020 1152    Assessment: 1.  Generalized abdominal pain: Very tender on exam today, wraps around to her back per patient, this has worsened over the past 6 months; current time for non-GI causes 2.  Change in bowel habits: Towards constipation, ongoing over the past few months, some better with MiraLAX 3.  GERD/eructations: Recent EGD a year ago, no help from Carafate 4 times daily, GI cocktail, Pepcid 40 twice daily and Pantoprazole 40 twice daily or changed to Dexilant 60 mg twice daily over the past many months, did not want to try Dicyclomine; consider functional +/- ongoing gastritis  Plan: 1.  Stop Dexilant.  Prescribed Nexium 40 mg twice daily, 30-60 minutes before breakfast and dinner.  #60 with 5 refills.  Patient tells me she thinks this worked a little bit better. 2.  Recommend the patient discontinue Pepcid as this does not seem to be helping at all. 3.  Scheduled patient for a CT of the abdomen pelvis with contrast for further evaluation of ongoing abdominal pain and change in bowel habits.  She will have a BMP today before leaving. 4.  Patient does not wish to try Dicyclomine, she read all the side effects and is worried about this medicine. 5.  Patient to follow in clinic per recommendations after  imaging above.  If she needs to be scheduled for follow-up this should likely be with Dr. Fuller Plan as he has not seen her during all of these complaints.  Ellouise Newer, PA-C [facility] 12/26/2021, 10:09 AM

## 2021-12-26 NOTE — Patient Instructions (Addendum)
Stop Dexilant and Pepcid.   We have sent the following medications to your pharmacy for you to pick up at your convenience: Nexium 40 mg twice daily 30-60 minutes before breakfast and dinner.   Your provider has requested that you go to the basement level for lab work before leaving today. Press "B" on the elevator. The lab is located at the first door on the left as you exit the elevator.  You have been scheduled for a CT scan of the abdomen and pelvis at [facility], 1st floor Radiology. You are scheduled on Friday 12/30/21 at 12:30 pm. You should arrive 15 minutes prior to your appointment time for registration.  We are giving you 2 bottles of contrast today that you will need to drink before arriving for the exam. The solution may taste better if refrigerated so put them in the refrigerator when you get home, but do NOT add ice or any other liquid to this solution as that would dilute it. Shake well before drinking.   Please follow the written instructions below on the day of your exam:   1) Do not eat anything after 8:30 am (4 hours prior to your test)   2) Drink 1 bottle of contrast @ 10:30 am (2 hours prior to your exam)  Remember to shake well before drinking and do NOT pour over ice.     Drink 1 bottle of contrast @ 11:30 am (1 hour prior to your exam)   You may take any medications as prescribed with a small amount of water, if necessary. If you take any of the following medications: METFORMIN, GLUCOPHAGE, GLUCOVANCE, AVANDAMET, RIOMET, FORTAMET, ACTOPLUS MET, JANUMET, GLUMETZA or METAGLIP, you MAY be asked to HOLD this medication 48 hours AFTER the exam.   The purpose of you drinking the oral contrast is to aid in the visualization of your intestinal tract. The contrast solution may cause some diarrhea. Depending on your individual set of symptoms, you may also receive an intravenous injection of x-ray contrast/dye. Plan on being at [facility] for 45 minutes or longer,  depending on the type of exam you are having performed.   If you have any questions regarding your exam or if you need to reschedule, you may call [facility] at [phone] between the hours of 8:00 am and 5:00 pm, Monday-Friday.   _______________________________________________________  If you are age 65 or older, your body mass index should be between 23-30. Your Body mass index is 31.27 kg/m. If this is out of the aforementioned range listed, please consider follow up with your Primary Care Provider.  If you are age 64 or younger, your body mass index should be between 19-25. Your Body mass index is 31.27 kg/m. If this is out of the aformentioned range listed, please consider follow up with your Primary Care Provider.   ________________________________________________________  The [facility] providers would like to encourage you to use MYCHART to communicate with providers for non-urgent requests or questions.  Due to long hold times on the telephone, sending your provider a message by MYCHART may be a faster and more efficient way to get a response.  Please allow 48 business hours for a response.  Please remember that this is for non-urgent requests.  _______________________________________________________  

## 2021-12-30 ENCOUNTER — Encounter (HOSPITAL_COMMUNITY): Payer: Self-pay

## 2021-12-30 ENCOUNTER — Ambulatory Visit (HOSPITAL_COMMUNITY)
Admission: RE | Admit: 2021-12-30 | Discharge: 2021-12-30 | Disposition: A | Discharge status: Home / Self Care | Payer: PPO | Source: Ambulatory Visit | Attending: Physician Assistant | Admitting: Physician Assistant

## 2021-12-30 DIAGNOSIS — R1084 Generalized abdominal pain: Secondary | ICD-10-CM | POA: Diagnosis not present

## 2021-12-30 DIAGNOSIS — R194 Change in bowel habit: Secondary | ICD-10-CM | POA: Diagnosis not present

## 2021-12-30 DIAGNOSIS — I700 Atherosclerosis of aorta: Secondary | ICD-10-CM | POA: Diagnosis not present

## 2021-12-30 DIAGNOSIS — R109 Unspecified abdominal pain: Secondary | ICD-10-CM | POA: Diagnosis not present

## 2021-12-30 MED ORDER — SODIUM CHLORIDE (PF) 0.9 % IJ SOLN
INTRAMUSCULAR | Status: AC
  Filled 2021-12-30: qty 50

## 2021-12-30 MED ORDER — IOHEXOL 300 MG/ML  SOLN
100.0000 mL | Freq: Once | INTRAMUSCULAR | Status: AC | PRN
  Administered 2021-12-30: 100 mL via INTRAVENOUS

## 2022-01-02 ENCOUNTER — Ambulatory Visit (INDEPENDENT_AMBULATORY_CARE_PROVIDER_SITE_OTHER): Payer: PPO | Admitting: Family Medicine

## 2022-01-02 ENCOUNTER — Encounter: Payer: Self-pay | Admitting: Family Medicine

## 2022-01-02 VITALS — BP 180/90 | HR 60 | Temp 98.0°F | Ht 59.0 in | Wt 155.4 lb

## 2022-01-02 DIAGNOSIS — I10 Essential (primary) hypertension: Secondary | ICD-10-CM

## 2022-01-02 MED ORDER — HYDRALAZINE HCL 25 MG PO TABS: 12.5000 mg | ORAL_TABLET | Freq: Two times a day (BID) | ORAL | 2 refills | Status: DC

## 2022-01-02 NOTE — Progress Notes (Signed)
Established Patient Office Visit  Subjective   Patient ID: Robin [name], female    DOB: 1933-10-05  Age: 88 y.o. MRN: [id number]  Chief Complaint  Patient presents with   Establish Care    Patient is here for transition of care visit.  HTN-- pt is on Current hypertension medications:      Sig   carvedilol (COREG) 3.125 MG tablet (Taking) Take 1/2 tab PO BID   furosemide (LASIX) 20 MG tablet (Taking) 1 tablet by mouth every other  day as needed for swelling   spironolactone (ALDACTONE) 50 MG tablet (Taking) Take 0.5 tablets (25 mg  total) by mouth 2 (two) times daily.   hydrALAZINE (APRESOLINE) 25 MG tablet Take 0.5 tablets (12.5 mg total) by  mouth 2 (two) times daily.    Patient reports her BP at home is usually normal, she brought in her readings today and I reviewed them. She reports that she is compliant with her medications, denies any side effects. Patient states she bought a new cuff for her home use and is getting blood pressures in the 120-130's, states it is usually not this high. States that she did take a lasix 20 mg yesterday due to swelling.    Patient Active Problem List   Diagnosis Date Noted   Osteoporosis of femur without pathological fracture 06/24/2019   Dyspnea 02/03/2019   Asthma 02/03/2019   Vertigo 06/26/2017   Hyperparathyroidism ([place]) 08/24/2014   Slow transit constipation 01/13/2014   IBS (irritable bowel syndrome), GERD, hx colon polyps, chronic abd pain - followed by Dr. Sharlett Iles in GI 06/11/2012   Osteopenia 08/29/2011   Vitamin D deficiency 08/29/2011   GERD (gastroesophageal reflux disease) 11/23/2010   Dyslipidemia 09/09/2007   Hypertension, essential, benign 09/09/2007      Review of Systems  All other systems reviewed and are negative.     Objective:     BP (!) 180/90 Comment: repeated by Rachel--jaf  Pulse 60   Temp 98 F (36.7 C) (Oral)   Ht '4\' 11"'$  (1.499 m)   Wt 155 lb 6.4 oz (70.5 kg)   SpO2 99%   BMI 31.39 kg/m   BP Readings from Last 3 Encounters:  01/02/22 (!) 180/90  12/26/21 (!) 140/80  10/03/21 (!) 164/80      Physical Exam Vitals reviewed.  Constitutional:      Appearance: Normal appearance. She is well-groomed and normal weight.  HENT:     Head: Normocephalic and atraumatic.  Cardiovascular:     Rate and Rhythm: Normal rate and regular rhythm.     Heart sounds: S1 normal and S2 normal.  Pulmonary:     Effort: Pulmonary effort is normal.     Breath sounds: Normal breath sounds and air entry.  Abdominal:     General: Abdomen is flat. Bowel sounds are normal.     Palpations: Abdomen is soft.  Musculoskeletal:        General: Normal range of motion.     Right lower leg: No edema.     Left lower leg: No edema.  Skin:    General: Skin is warm and dry.  Neurological:     General: No focal deficit present.     Mental Status: She is alert and oriented to person, place, and time. Mental status is at baseline.     Gait: Gait is intact.  Psychiatric:        Mood and Affect: Mood and affect normal.  Speech: Speech normal.        Behavior: Behavior normal.        Judgment: Judgment normal.      No results found for any visits on 01/02/22.  Last metabolic panel Lab Results  Component Value Date   GLUCOSE 86 12/26/2021   NA 139 12/26/2021   K 4.5 12/26/2021   CL 104 12/26/2021   CO2 27 12/26/2021   BUN 17 12/26/2021   CREATININE 1.04 12/26/2021   GFRNONAA >60 04/23/2021   CALCIUM 11.1 (H) 12/26/2021   PROT 7.1 10/03/2021   ALBUMIN 4.5 10/03/2021   BILITOT 0.7 10/03/2021   ALKPHOS 68 10/03/2021   AST 17 10/03/2021   ALT 13 10/03/2021   ANIONGAP 7 04/23/2021      The ASCVD Risk score (Arnett DK, et al., 2019) failed to calculate for the following reasons:   The 2019 ASCVD risk score is only valid for ages 61 to 24    Assessment & Plan:   Problem List Items Addressed This Visit       Cardiovascular and Mediastinum   Hypertension, essential, benign -  Primary    Patient's BP today in office is consistently elevated at 180. Patient's BP at home is consistently normal however. She even bought a new cuff thinking that the previous cuff she was using was not accurate. She has multiple intolerances to other BP medications and cannot take higher doses of the medications without getting side effects, will continue her current regimen and I advised she continue to check her BP daily. Current hypertension medications:       Sig   carvedilol (COREG) 3.125 MG tablet (Taking) Take 1/2 tab PO BID   furosemide (LASIX) 20 MG tablet (Taking) 1 tablet by mouth every other day as needed for swelling   spironolactone (ALDACTONE) 50 MG tablet (Taking) Take 0.5 tablets (25 mg total) by mouth 2 (two) times daily.   hydrALAZINE (APRESOLINE) 25 MG tablet Take 0.5 tablets (12.5 mg total) by mouth 2 (two) times daily.           Relevant Medications   hydrALAZINE (APRESOLINE) 25 MG tablet    Return in about 6 months (around 07/05/2022).    Farrel Conners, MD

## 2022-01-02 NOTE — Assessment & Plan Note (Addendum)
Patient's BP today in office is consistently elevated at 180. Patient's BP at home is consistently normal however. She even bought a new cuff thinking that the previous cuff she was using was not accurate. She has multiple intolerances to other BP medications and cannot take higher doses of the medications without getting side effects, will continue her current regimen and I advised she continue to check her BP daily. Current hypertension medications:      Sig   carvedilol (COREG) 3.125 MG tablet (Taking) Take 1/2 tab PO BID   furosemide (LASIX) 20 MG tablet (Taking) 1 tablet by mouth every other day as needed for swelling   spironolactone (ALDACTONE) 50 MG tablet (Taking) Take 0.5 tablets (25 mg total) by mouth 2 (two) times daily.   hydrALAZINE (APRESOLINE) 25 MG tablet Take 0.5 tablets (12.5 mg total) by mouth 2 (two) times daily.

## 2022-01-19 NOTE — Progress Notes (Signed)
HPI: FU dizziness and hypertension. ABIs August 2010 normal. Stress echo February 2014 normal. MRI July 2018 showed chronic small vessel ischemic changes in the cerebral white matter and pons. MRA without significant vertebrobasilar disease. CTA showed normal circle of Willis. EEG also normal. Patient has chronic mild dizziness. CTA September 2020 showed no pulmonary embolus. There was note of coronary artery disease/aortic atherosclerosis. Echocardiogram April 2022 showed normal LV function, mild mitral regurgitation; mild aortic stenosis with mean gradient 10 mmHg.  Monitor January 2023 showed sinus rhythm with PACs, brief PAT, PVCs and rare couplet.  Since last seen, patient has occasional dyspnea.  She denies exertional chest pain though she is having some pain she attributes to her stomach.  No syncope.  Current Outpatient Medications  Medication Sig Dispense Refill   acetaminophen (TYLENOL) 500 MG tablet Take 500-1,000 mg by mouth every 6 (six) hours as needed for mild pain or headache.      AMBULATORY NON FORMULARY MEDICATION Medication Name: GI Cocktail - 90 ml 2% Lidocaine, 90 ml Dicyclomine '10mg'$ /20m, 270 ml Maalox - Take 5-10 ml every 6 hour as needed. 450 mL 3   carvedilol (COREG) 3.125 MG tablet Take 1/2 tab PO BID 180 tablet 3   cetirizine (ZYRTEC) 10 MG tablet Take 10 mg by mouth daily.     Cholecalciferol 25 MCG (1000 UT) TBDP Take 1,000 Units by mouth daily. 90 tablet 3   clobetasol ointment (TEMOVATE) 0.05 % Apply to affected area twice weekly. 60 g 4   esomeprazole (NEXIUM) 40 MG capsule Take 1 capsule (40 mg total) by mouth 2 (two) times daily before a meal. 60 capsule 2   ezetimibe (ZETIA) 10 MG tablet TAKE 1 BY MOUTH DAILY 90 tablet 0   furosemide (LASIX) 20 MG tablet 1 tablet by mouth every other day as needed for swelling 45 tablet 3   hydrALAZINE (APRESOLINE) 25 MG tablet Take 0.5 tablets (12.5 mg total) by mouth 2 (two) times daily. 30 tablet 2   rosuvastatin (CRESTOR)  5 MG tablet Take 0.5 tablets (2.5 mg total) by mouth daily. 45 tablet 3   spironolactone (ALDACTONE) 50 MG tablet Take 0.5 tablets (25 mg total) by mouth 2 (two) times daily. 180 tablet 2   No current facility-administered medications for this visit.     Past Medical History:  Diagnosis Date   Abnormal Pap smear of cervix    Allergy    Arthritis    Asthma    Cataract 2018   bilateral eyes   Colon polyps    tubulovillous adenoma, 2010   External hemorrhoids without mention of complication    GERD (gastroesophageal reflux disease)    Headache    eval with neuro in 2011   Hyperlipemia    Hyperparathyroidism ([place]    seeing Dr. KDwyane Deein endocrinology   Hypertension    IBS (irritable bowel syndrome), GERD, hx colon polyps, chronic abd pain - followed by Dr. PSharlett Ilesin GI 06/11/2012   Osteoporosis 08/29/2011   Vertigo    eval with neuro in 2011, brief recurrence 2016    Past Surgical History:  Procedure Laterality Date   BACK SURGERY  1974   CARPAL TUNNEL RELEASE Left 06/2008   COLONOSCOPY     EYE SURGERY  07/2017   bilateral cataract extraction   POLYPECTOMY     TONSILLECTOMY     UPPER GASTROINTESTINAL ENDOSCOPY      Social History   Socioeconomic History   Marital status: Widowed  Spouse name: Not on file   Number of children: 2   Years of education: Not on file   Highest education level: Not on file  Occupational History   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  Tobacco Use   Smoking status: Never   Smokeless tobacco: Never  Vaping Use   Vaping Use: Never used  Substance and Sexual Activity   Alcohol use: No    Alcohol/week: 0.0 standard drinks of alcohol   Drug use: No   Sexual activity: Not Currently    Birth control/protection: Post-menopausal  Other Topics Concern   Not on file  Social History Narrative   Lives alone in a one story home.  Has 2 children, one son and one daughter with 2 grandkids and 1  great-grandchild.    Retired from working for a bank.     Education: high school.    Social Determinants of Health   Financial Resource Strain: Low Risk  (11/28/2021)   Overall Financial Resource Strain (CARDIA)    Difficulty of Paying Living Expenses: Not hard at all  Food Insecurity: No Food Insecurity (11/28/2021)   Hunger Vital Sign    Worried About Running Out of Food in the Last Year: Never true    Ran Out of Food in the Last Year: Never true  Transportation Needs: No Transportation Needs (11/28/2021)   PRAPARE - Hydrologist (Medical): No    Lack of Transportation (Non-Medical): No  Physical Activity: Inactive (11/28/2021)   Exercise Vital Sign    Days of Exercise per Week: 0 days    Minutes of Exercise per Session: 0 min  Stress: No Stress Concern Present (11/28/2021)   [place]    Feeling of Stress : Not at all  Social Connections: Moderately Isolated (11/28/2021)   Social Connection and Isolation Panel [NHANES]    Frequency of Communication with Friends and Family: More than three times a week    Frequency of Social Gatherings with Friends and Family: More than three times a week    Attends Religious Services: Never    Marine scientist or Organizations: Yes    Attends Archivist Meetings: Never    Marital Status: Widowed  Intimate Partner Violence: Not At Risk (11/28/2021)   Humiliation, Afraid, Rape, and Kick questionnaire    Fear of Current or Ex-Partner: No    Emotionally Abused: No    Physically Abused: No    Sexually Abused: No    Family History  Problem Relation Age of Onset   Diabetes Brother    Hypertension Brother    Stroke Brother 1   Hypertension Father    Stroke Father    Diabetes Sister    Hypertension Sister    Heart disease Sister        heart failure   CAD Mother        Died of MI at age 76   Hypertension Mother    CAD Brother         Died of MI at age 72   Hypertension Brother    Stroke Sister 32   Hypertension Sister    Stroke Brother 83   Hypertension Brother    Colon cancer Neg Hx    Colon polyps Neg Hx    Esophageal cancer Neg Hx    Pancreatic cancer Neg Hx    Stomach cancer Neg Hx  Rectal cancer Neg Hx     ROS: no fevers or chills, productive cough, hemoptysis, dysphasia, odynophagia, melena, hematochezia, dysuria, hematuria, rash, seizure activity, orthopnea, PND, pedal edema, claudication. Remaining systems are negative.  Physical Exam: Well-developed well-nourished in no acute distress.  Skin is warm and dry.  HEENT is normal.  Neck is supple.  Chest is clear to auscultation with normal expansion.  Cardiovascular exam is regular rate and rhythm.  2/6 systolic murmur left sternal border.  S2 is not diminished. Abdominal exam nontender or distended. No masses palpated. Extremities show no edema. neuro grossly intact  A/P  1 hypertension-patient's blood pressure is elevated but she follows this at home and it is controlled.  As noted in previous office visits she has not tolerated multiple medications.  We will therefore continue present medications at low-dose as this is what she has tolerated in the past.  2 coronary artery disease-based on previous CT demonstrating coronary calcification.  Continue statin.  3 history of syncope-patient has had no recurrences.  4 hyperlipidemia-continue present medications.  5 history of aortic stenosis-mild on most recent echocardiogram.  We will likely repeat study in 1 year.  Note S2 is not diminished on examination today.  6 chronic dizziness-previous evaluation unrevealing.  Kirk Ruths, MD

## 2022-01-20 ENCOUNTER — Other Ambulatory Visit: Payer: Self-pay | Admitting: Family Medicine

## 2022-01-20 DIAGNOSIS — Z1231 Encounter for screening mammogram for malignant neoplasm of breast: Secondary | ICD-10-CM

## 2022-01-30 ENCOUNTER — Encounter: Payer: Self-pay | Admitting: Cardiology

## 2022-01-30 ENCOUNTER — Ambulatory Visit: Payer: PPO | Attending: Cardiology | Admitting: Cardiology

## 2022-01-30 VITALS — BP 150/84 | HR 72 | Ht 59.0 in | Wt 155.0 lb

## 2022-01-30 DIAGNOSIS — I2510 Atherosclerotic heart disease of native coronary artery without angina pectoris: Secondary | ICD-10-CM

## 2022-01-30 DIAGNOSIS — E7800 Pure hypercholesterolemia, unspecified: Secondary | ICD-10-CM

## 2022-01-30 DIAGNOSIS — I350 Nonrheumatic aortic (valve) stenosis: Secondary | ICD-10-CM | POA: Diagnosis not present

## 2022-01-30 DIAGNOSIS — I10 Essential (primary) hypertension: Secondary | ICD-10-CM

## 2022-01-30 NOTE — Patient Instructions (Signed)
  Follow-Up: At [facility], you and your health needs are our priority.  As part of our continuing mission to provide you with exceptional heart care, we have created designated Provider Care Teams.  These Care Teams include your primary Cardiologist (physician) and Advanced Practice Providers (APPs -  Physician Assistants and Nurse Practitioners) who all work together to provide you with the care you need, when you need it.  We recommend signing up for the patient portal called "MyChart".  Sign up information is provided on this After Visit Summary.  MyChart is used to connect with patients for Virtual Visits (Telemedicine).  Patients are able to view lab/test results, encounter notes, upcoming appointments, etc.  Non-urgent messages can be sent to your provider as well.   To learn more about what you can do with MyChart, go to [email or web address].    Your next appointment:   6 month(s)  The format for your next appointment:   In Person  Provider:   Brian Crenshaw, MD    

## 2022-02-02 ENCOUNTER — Telehealth: Payer: Self-pay | Admitting: Pharmacist

## 2022-02-02 NOTE — Chronic Care Management (AMB) (Signed)
    Chronic Care Management Pharmacy Assistant   Name: Robin [name]  MRN: [id number] DOB: 24-Aug-1933  Reason for Encounter: Reschedule Follow up with Jeni Salles due to schedule conflict.   Recent office visits:  01/02/22 Farrel Conners, MD - Patient presented for Hypertension essential benign. Stopped Famotidine.  11/28/21 Criselda Peaches, LPN - Patient presented for Medicare Annual Wellness Exam. No medication changes.  Recent consult visits:  01/30/22 [name], MD (Cardiology) - Patient presented for Coronary Artery disease involving native coronary artery of native heart without angina pectoris and other concerns. No medication changes.  12/30/21 Patient presented to [facility] for CT Abdomen Pelvis W/ Contrast.  12/26/21 Levin Erp, PA Gertie Fey) - Patient presented for Change in bowel habits and other concerns. Prescribed Esomeprazole Magnesium. Stopped Dicyclomine. Stopped Rabeprazole.  Hospital visits:  None in previous 6 months  Medications: Outpatient Encounter Medications as of 02/02/2022  Medication Sig   acetaminophen (TYLENOL) 500 MG tablet Take 500-1,000 mg by mouth every 6 (six) hours as needed for mild pain or headache.    AMBULATORY NON FORMULARY MEDICATION Medication Name: GI Cocktail - 90 ml 2% Lidocaine, 90 ml Dicyclomine '10mg'$ /36m, 270 ml Maalox - Take 5-10 ml every 6 hour as needed.   carvedilol (COREG) 3.125 MG tablet Take 1/2 tab PO BID   cetirizine (ZYRTEC) 10 MG tablet Take 10 mg by mouth daily.   Cholecalciferol 25 MCG (1000 UT) TBDP Take 1,000 Units by mouth daily.   clobetasol ointment (TEMOVATE) 0.05 % Apply to affected area twice weekly.   esomeprazole (NEXIUM) 40 MG capsule Take 1 capsule (40 mg total) by mouth 2 (two) times daily before a meal.   ezetimibe (ZETIA) 10 MG tablet TAKE 1 BY MOUTH DAILY   furosemide (LASIX) 20 MG tablet 1 tablet by mouth every other day as needed for swelling   hydrALAZINE (APRESOLINE)  25 MG tablet Take 0.5 tablets (12.5 mg total) by mouth 2 (two) times daily.   rosuvastatin (CRESTOR) 5 MG tablet Take 0.5 tablets (2.5 mg total) by mouth daily.   spironolactone (ALDACTONE) 50 MG tablet Take 0.5 tablets (25 mg total) by mouth 2 (two) times daily.   No facility-administered encounter medications on file as of 02/02/2022.   Notes: Call to patient to reschedule follow up with MJeni Sallesas she will be out of office. Appointment offered for 11/13 pt in agreement.   Care Gaps: Zoster Vaccine - Overdue COVID Booster - Overdue Flu Vaccine - Postponed BP- 150/84 01/30/22 AWV- 7/23 CCM- 11/23  Star Rating Drugs: Rosuvastatin 5 mg - Last filled 07/26/21 90 DS at [facility] (Patient taking half tab)     LPaw PawPharmacist Assistant [phone]

## 2022-02-06 ENCOUNTER — Ambulatory Visit (INDEPENDENT_AMBULATORY_CARE_PROVIDER_SITE_OTHER): Payer: PPO

## 2022-02-06 DIAGNOSIS — Z23 Encounter for immunization: Secondary | ICD-10-CM | POA: Diagnosis not present

## 2022-02-27 ENCOUNTER — Ambulatory Visit
Admission: RE | Admit: 2022-02-27 | Discharge: 2022-02-27 | Disposition: A | Discharge status: Home / Self Care | Payer: PPO | Source: Ambulatory Visit | Attending: Family Medicine | Admitting: Family Medicine

## 2022-02-27 DIAGNOSIS — Z1231 Encounter for screening mammogram for malignant neoplasm of breast: Secondary | ICD-10-CM

## 2022-02-27 NOTE — Progress Notes (Signed)
Normal mammo

## 2022-02-28 ENCOUNTER — Other Ambulatory Visit (HOSPITAL_BASED_OUTPATIENT_CLINIC_OR_DEPARTMENT_OTHER): Payer: Self-pay

## 2022-02-28 MED ORDER — COMIRNATY 30 MCG/0.3ML IM SUSY
PREFILLED_SYRINGE | INTRAMUSCULAR | 0 refills | Status: DC
  Filled 2022-02-28: qty 0.3, 1d supply, fill #0

## 2022-03-13 ENCOUNTER — Telehealth: Payer: Self-pay | Admitting: Family Medicine

## 2022-03-13 DIAGNOSIS — E7800 Pure hypercholesterolemia, unspecified: Secondary | ICD-10-CM

## 2022-03-13 NOTE — Telephone Encounter (Signed)
Pt call and stated she need a refill on ezetimibe (ZETIA) 10 MG tablet sent to mail order for 90 day refill.

## 2022-03-14 ENCOUNTER — Encounter: Payer: Self-pay | Admitting: Gastroenterology

## 2022-03-14 ENCOUNTER — Ambulatory Visit: Payer: PPO | Admitting: Gastroenterology

## 2022-03-14 VITALS — BP 130/80 | HR 64 | Ht 59.0 in | Wt 156.8 lb

## 2022-03-14 DIAGNOSIS — R1010 Upper abdominal pain, unspecified: Secondary | ICD-10-CM | POA: Diagnosis not present

## 2022-03-14 DIAGNOSIS — K219 Gastro-esophageal reflux disease without esophagitis: Secondary | ICD-10-CM

## 2022-03-14 DIAGNOSIS — K589 Irritable bowel syndrome without diarrhea: Secondary | ICD-10-CM

## 2022-03-14 MED ORDER — DICYCLOMINE HCL 10 MG PO CAPS: 10.0000 mg | ORAL_CAPSULE | Freq: Three times a day (TID) | ORAL | 11 refills | Status: DC

## 2022-03-14 MED ORDER — EZETIMIBE 10 MG PO TABS: ORAL_TABLET | ORAL | 0 refills | Status: DC

## 2022-03-14 NOTE — Patient Instructions (Signed)
We have sent the following medications to your pharmacy for you to pick up at your convenience: dicyclomine.   The [facility] providers would like to encourage you to use [facility] to communicate with providers for non-urgent requests or questions.  Due to long hold times on the telephone, sending your provider a message by [facility] may be a faster and more efficient way to get a response.  Please allow 48 business hours for a response.  Please remember that this is for non-urgent requests.   Thank you for choosing me and [facility].  Pricilla Riffle. Dagoberto Ligas., MD., Marval Regal

## 2022-03-14 NOTE — Progress Notes (Signed)
    Assessment     IBS-C GERD with a history of an esophageal stricture and a small hiatal hernia   Recommendations    After discussion about the low risk of side effects and the ability to stop the medication and try another if a side effect occurs she is comfortable trying dicyclomine 10 mg p.o. 3 times daily. This may be taken before meals or 3 times daily at other times.  Contact us if her symptoms are not adequately controlled or if she experiences a side effect. Continue MiraLAX daily as needed Follow antireflux measures, continue Nexium 40 mg p.o. twice daily and Gaviscon 3 times daily as needed. REV in 1 year.   HPI    This is an 88 year old female with upper abdominal pain, GERD and IBS.  She is accompanied by her daughter.  Her reflux symptoms are under better control on Nexium 40 mg twice daily.  She takes Gaviscon as needed for gas and dyspepsia.  She still has intermittent epigastric pain that sometimes follows meals and often occurs at other times.  She takes MiraLAX daily as needed with good control of her constipation.  She has been reluctant to take an antispasmodic as she was concerned about the side effect profile.   EGD March 2022 - Benign-appearing esophageal stenosis. Dilated. - Three gastric polyps. Biopsied. - Small hiatal hernia. - Normal duodenal bulb and second portion of the duodenum.   Labs / Imaging       Latest Ref Rng & Units 10/03/2021   10:22 AM 04/23/2021   12:21 PM 03/28/2021   10:56 AM  Hepatic Function  Total Protein 6.0 - 8.3 g/dL 7.1  6.7  7.3   Albumin 3.5 - 5.2 g/dL 4.5  4.1  4.6   AST 0 - 37 U/L _0 ALT 0 - 35 U/L _1 Alk Phosphatase 39 - 117 U/L 68  68  89   Total Bilirubin 0.2 - 1.2 mg/dL 0.7  0.8  0.6        Latest Ref Rng & Units 10/03/2021   10:22 AM 04/23/2021   12:21 PM 03/28/2021   10:56 AM  CBC  WBC 4.0 - 10.5 K/uL 7.1  8.4  6.4   Hemoglobin 12.0 - 15.0 g/dL 12.7  13.0  13.7   Hematocrit 36.0 -  46.0 % 37.8  40.4  41.0   Platelets 150.0 - 400.0 K/uL 292.0  309  311.0     CT AP IMPRESSION 01/02/2022 1. No acute intra-abdominal or pelvic pathology. 2. Sigmoid diverticulosis. No bowel obstruction. Normal appendix. 3. Aortic Atherosclerosis   Current Medications, Allergies, Past Medical History, Past Surgical History, Family History and Social History were reviewed in [employer] record.   Physical Exam: General: Well developed, well nourished, elderly, no acute distress Head: Normocephalic and atraumatic Eyes: Sclerae anicteric, EOMI Ears: Normal auditory acuity Mouth: Not examined Lungs: Clear throughout to auscultation Heart: Regular rate and rhythm; no murmurs, rubs or bruits Abdomen: Soft, non tender and non distended. No masses, hepatosplenomegaly or hernias noted. Normal Bowel sounds Rectal: Not done Musculoskeletal: Symmetrical with no gross deformities  Pulses:  Normal pulses noted Extremities: No clubbing, cyanosis, edema or deformities noted Neurological: Alert oriented x 4, grossly nonfocal Psychological:  Alert and cooperative. Normal mood and affect   Braydn Carneiro T. Fuller Plan, MD 03/14/2022, 10:52 AM

## 2022-03-14 NOTE — Telephone Encounter (Signed)
Ok to send

## 2022-03-14 NOTE — Telephone Encounter (Signed)
Rx done. 

## 2022-03-20 ENCOUNTER — Telehealth: Payer: PPO

## 2022-03-23 ENCOUNTER — Encounter: Payer: Self-pay | Admitting: Gastroenterology

## 2022-03-24 ENCOUNTER — Telehealth: Payer: Self-pay | Admitting: Pharmacist

## 2022-03-24 NOTE — Chronic Care Management (AMB) (Signed)
    Chronic Care Management Pharmacy Assistant   Name: Robin [name]  MRN: [id number] DOB: [birth year]  03/24/22 APPOINTMENT REMINDER   Patient was reminded to have all medications, supplements and any blood glucose and blood pressure readings available for review with Jeni Salles, Pharm. D, for telephone visit on 03/28/22 at 9:15.    Care Gaps: Zoster Vaccine - Overdue COVID Booster - Overdue BP- 130/80 03/14/22  Star Rating Drug: Rosuvastatin 5 mg - Last filled 01/24/22 90 DS at [facility] (Patient taking half tab)    Any gaps in medications fill history? None     Medications: Outpatient Encounter Medications as of 03/24/2022  Medication Sig   acetaminophen (TYLENOL) 500 MG tablet Take 500-1,000 mg by mouth every 6 (six) hours as needed for mild pain or headache.    AMBULATORY NON FORMULARY MEDICATION Medication Name: GI Cocktail - 90 ml 2% Lidocaine, 90 ml Dicyclomine '10mg'$ /36m, 270 ml Maalox - Take 5-10 ml every 6 hour as needed.   carvedilol (COREG) 3.125 MG tablet Take 1/2 tab PO BID   cetirizine (ZYRTEC) 10 MG tablet Take 10 mg by mouth daily.   Cholecalciferol 25 MCG (1000 UT) TBDP Take 1,000 Units by mouth daily.   clobetasol ointment (TEMOVATE) 0.05 % Apply to affected area twice weekly.   COVID-19 mRNA vaccine 2023-2024 (COMIRNATY) syringe Inject into the muscle.   dicyclomine (BENTYL) 10 MG capsule Take 1 capsule (10 mg total) by mouth 3 (three) times daily before meals.   esomeprazole (NEXIUM) 40 MG capsule Take 1 capsule (40 mg total) by mouth 2 (two) times daily before a meal.   ezetimibe (ZETIA) 10 MG tablet TAKE 1 BY MOUTH DAILY   furosemide (LASIX) 20 MG tablet 1 tablet by mouth every other day as needed for swelling   hydrALAZINE (APRESOLINE) 25 MG tablet Take 0.5 tablets (12.5 mg total) by mouth 2 (two) times daily.   rosuvastatin (CRESTOR) 5 MG tablet Take 0.5 tablets (2.5 mg total) by mouth daily.   spironolactone (ALDACTONE) 50 MG tablet Take 0.5  tablets (25 mg total) by mouth 2 (two) times daily.   No facility-administered encounter medications on file as of 03/24/2022.       LElktonClinical Pharmacist Assistant [phone]

## 2022-03-27 ENCOUNTER — Ambulatory Visit: Payer: PPO | Admitting: Pharmacist

## 2022-03-27 DIAGNOSIS — I10 Essential (primary) hypertension: Secondary | ICD-10-CM

## 2022-03-27 DIAGNOSIS — E785 Hyperlipidemia, unspecified: Secondary | ICD-10-CM

## 2022-03-27 NOTE — Progress Notes (Signed)
Chronic Care Management Pharmacy Note  03/27/2022 Name:  Robin [name] MRN:  [id number] DOB:  [birth year]  Summary: BP is mostly at goal < 140/90 per home readings Pt reports frequent urination at night  Recommendations/Changes made from today's visit: -Recommended follow up with endocrinology -Recommended kegel exercises  Plan: BP assessment in 3 months Follow up in 6 months  Subjective: Robin [name] is an 88 y.o. year old female who is a primary patient of Legrand Como, Royston Cowper, MD.  The CCM team was consulted for assistance with disease management and care coordination needs.    Engaged with patient by telephone for follow up visit in response to provider referral for pharmacy case management and/or care coordination services.   Consent to Services:  The patient was given information about Chronic Care Management services, agreed to services, and gave verbal consent prior to initiation of services.  Please see initial visit note for detailed documentation.   Patient Care Team: Farrel Conners, MD as PCP - General (Family Medicine) [name] [name], MD as PCP - Cardiology (Cardiology) [name], MD as Consulting Physician (Gastroenterology) [name] [name], MD as Consulting Physician (Cardiology) [name], MD as Consulting Physician (Ophthalmology) [name], MD as Consulting Physician (Gynecology) [name], [facility] as Pharmacist (Pharmacist)  Recent office visits: 01/02/22 Farrel Conners, MD - Patient presented for [facility] visit. Stopped Famotidine.   11/28/21 Criselda Peaches, LPN - Patient presented for Medicare Annual Wellness Exam. No medication changes.  02/06/22 Mykal Good, CMA: Patient presented for influenza vaccination.  10/03/21 Micheline Rough, MD: Patient presented for HTN follow up. Recommended splitting Coreg to 1/2 tablet BID and spironolactone 1 tablet BID. Recommended a few days a week of [phone] mcg vitamin B12.  Recommended follow up with endocrinology.  Recent consult visits: 03/23/22 Mychart message with gastroenterology: Recommended d/c'ing dicyclomine and starting FDgard 1-2 tablets TID as needed.  03/14/22 Lucio Edward, MD (gastroenterology): Patient presented for abdominal pain. Prescribed dicyclomine 10 mg TID.   01/30/22 [name], MD (Cardiology) - Patient presented for Coronary Artery disease involving native coronary artery of native heart without angina pectoris and other concerns. No medication changes.   12/30/21 Patient presented to [facility] for CT Abdomen Pelvis W/ Contrast.   12/26/21 Levin Erp, PA Gertie Fey) - Patient presented for Change in bowel habits and other concerns. Prescribed Esomeprazole Magnesium. Stopped Dicyclomine. Stopped Rabeprazole.  Hospital visits: None in previous 6 months.  Objective:  Lab Results  Component Value Date   CREATININE 1.04 12/26/2021   BUN 17 12/26/2021   GFR 48.84 (L) 12/26/2021   GFRNONAA >60 04/23/2021   GFRAA 74 03/16/2020   NA 139 12/26/2021   K 4.5 12/26/2021   CALCIUM 11.1 (H) 12/26/2021   CO2 27 12/26/2021   GLUCOSE 86 12/26/2021    Lab Results  Component Value Date/Time   HGBA1C 5.5 09/30/2013 09:28 AM   HGBA1C 5.4 06/19/2012 09:19 AM   GFR 48.84 (L) 12/26/2021 10:51 AM   GFR 46.76 (L) 10/03/2021 10:22 AM    Last diabetic Eye exam: No results found for: "HMDIABEYEEXA"  Last diabetic Foot exam: No results found for: "HMDIABFOOTEX"   Lab Results  Component Value Date   CHOL 166 10/03/2021   HDL 59.50 10/03/2021   LDLCALC 87 10/03/2021   TRIG 94.0 10/03/2021   CHOLHDL 3 10/03/2021       Latest Ref Rng & Units 10/03/2021   10:22 AM 04/23/2021   12:21  PM 03/28/2021   10:56 AM  Hepatic Function  Total Protein 6.0 - 8.3 g/dL 7.1  6.7  7.3   Albumin 3.5 - 5.2 g/dL 4.5  4.1  4.6   AST 0 - 37 U/L _0 ALT 0 - 35 U/L _1 Alk Phosphatase 39 - 117 U/L 68  68  89   Total  Bilirubin 0.2 - 1.2 mg/dL 0.7  0.8  0.6     Lab Results  Component Value Date/Time   TSH 1.81 10/03/2021 10:22 AM   TSH 1.70 07/05/2020 12:33 PM       Latest Ref Rng & Units 10/03/2021   10:22 AM 04/23/2021   12:21 PM 03/28/2021   10:56 AM  CBC  WBC 4.0 - 10.5 K/uL 7.1  8.4  6.4   Hemoglobin 12.0 - 15.0 g/dL 12.7  13.0  13.7   Hematocrit 36.0 - 46.0 % 37.8  40.4  41.0   Platelets 150.0 - 400.0 K/uL 292.0  309  311.0     Lab Results  Component Value Date/Time   VD25OH 46.29 03/28/2021 10:56 AM   VD25OH 44.89 07/05/2020 12:33 PM    Clinical ASCVD: No  The ASCVD Risk score (Arnett DK, et al., 2019) failed to calculate for the following reasons:   The 2019 ASCVD risk score is only valid for ages 37 to 69       11/28/2021    9:38 AM 04/27/2021    8:44 AM 11/24/2020    9:34 AM  Depression screen PHQ 2/9  Decreased Interest 0 0 0  Down, Depressed, Hopeless 0 0 0  PHQ - 2 Score 0 0 0  Altered sleeping  0   Tired, decreased energy  1   Change in appetite  0   Feeling bad or failure about yourself   0   Trouble concentrating  0   Moving slowly or fidgety/restless  0   Suicidal thoughts  0   PHQ-9 Score  1      Social History   Tobacco Use  Smoking Status Never  Smokeless Tobacco Never   BP Readings from Last 3 Encounters:  03/14/22 130/80  01/30/22 (!) 150/84  01/02/22 (!) 180/90   Pulse Readings from Last 3 Encounters:  03/14/22 64  01/30/22 72  01/02/22 60   Wt Readings from Last 3 Encounters:  03/14/22 156 lb 12.8 oz (71.1 kg)  01/30/22 155 lb (70.3 kg)  01/02/22 155 lb 6.4 oz (70.5 kg)   BMI Readings from Last 3 Encounters:  03/14/22 31.67 kg/m  01/30/22 31.31 kg/m  01/02/22 31.39 kg/m    Assessment/Interventions: Review of patient past medical history, allergies, medications, health status, including review of consultants reports, laboratory and other test data, was performed as part of comprehensive evaluation and provision of chronic care  management services.   SDOH:  (Social Determinants of Health) assessments and interventions performed: Yes  SDOH Interventions    Flowsheet Row Chronic Care Management from 03/27/2022 in [place] at [place] from 11/28/2021 in [place] at [place] Management from 01/24/2021 in [place] at [place] from 11/24/2019 in [place] at [place] from 07/15/2018 in [place] at [place] Interventions -- Intervention Not Indicated -- Intervention Not Indicated --  Housing Interventions -- Intervention Not Indicated -- Intervention Not Indicated --  Transportation Interventions --  Intervention Not Indicated Intervention Not Indicated Intervention Not Indicated --  Depression Interventions/Treatment  -- -- -- PNT6-1 Score <4 Follow-up Not Indicated PHQ2-9 Score <4 Follow-up Not Indicated  Financial Strain Interventions Intervention Not Indicated Intervention Not Indicated Intervention Not Indicated Intervention Not Indicated --  Physical Activity Interventions -- Intervention Not Indicated -- --  [Patient states that has issues with her back but plans on working on physical activity in her home] --  Stress Interventions -- Intervention Not Indicated -- Intervention Not Indicated --  Social Connections Interventions -- Intervention Not Indicated -- Intervention Not Indicated --       SDOH Screenings   Food Insecurity: No Food Insecurity (11/28/2021)  Housing: Low Risk  (11/28/2021)  Transportation Needs: No Transportation Needs (11/28/2021)  Alcohol Screen: Low Risk  (11/28/2021)  Depression (PHQ2-9): Low Risk  (11/28/2021)  Financial Resource Strain: Low Risk  (03/27/2022)  Physical Activity: Inactive (11/28/2021)  Social Connections: Moderately Isolated (11/28/2021)  Stress: No Stress Concern Present (11/28/2021)  Tobacco Use: Low Risk  (03/14/2022)    CCM Care Plan  Allergies  Allergen Reactions   Prilosec [Omeprazole] Cough   Amlodipine     Feet and ankle swelling   Atenolol     Did not help lower bp   Bystolic [Nebivolol Hcl]     Severe stomach pain   Chlorthalidone     Cramps, low back pain and itchy scalp   Clonidine Derivatives     fainting   Losartan     Did not lower bp   Protonix [Pantoprazole Sodium] Swelling   Amoxicillin Rash   Flagyl [Metronidazole] Rash    Medications Reviewed Today     Reviewed by [name], [facility] (Pharmacist) on 03/27/22 at [phone]  Med List Status: <None>   Medication Order Taking? Sig Documenting Provider Last Dose Status Informant  acetaminophen (TYLENOL) 500 MG tablet [id number]  Take 500-1,000 mg by mouth every 6 (six) hours as needed for mild pain or headache.  [provider]  Active Self  Camillo Flaming MEDICATION [id number]  Medication Name: GI Cocktail - 90 ml 2% Lidocaine, 90 ml Dicyclomine 49m/5ml, 270 ml Maalox - Take 5-10 ml every 6 hour as needed. LEllouise NewerLR[place]uch[place] PUtah Active   [place] Oil-Levomenthol (FDGARD PO) [id number] Take 1 capsule by mouth 3 (three) times daily. [provider] Taking Active   carvedilol (COREG) 3.125 MG tablet [id number] Take 1/2 tab PO BID [name], Junell C, MD  Active   cetirizine (ZYRTEC) 10 MG tablet [id number] Take 10 mg by mouth daily. [provider]  Active Self  Cholecalciferol 25 MCG (1000 UT) TBDP [id number] Take 1,000 Units by mouth daily. ERenato Shin MD  Active Self  clobetasol ointment (TEMOVATE) 0.05 % [id number] Apply to affected area twice weekly. LPrincess Bruins MD  Active   esomeprazole (NEXIUM) 40 MG capsule [id number] Take 1 capsule (40 mg total) by mouth 2 (two) times daily before a meal. LLevin Erp PUtah Active   ezetimibe (ZETIA) 10 MG tablet [id number] TAKE 1 BY MOUTH DAILY MFarrel Conners MD  Active   furosemide (LASIX) 20 MG tablet [id number] 1 tablet by  mouth every other day as needed for swelling CLelon Perla MD  Active   hydrALAZINE (APRESOLINE) 25 MG tablet [id number] Take 0.5 tablets (12.5 mg total) by mouth 2 (two) times daily. MFarrel Conners MD  Active   rosuvastatin (CRESTOR) 5 MG tablet [id number]  Take 0.5 tablets (2.5 mg total) by mouth daily. [name], MD  Active   spironolactone (ALDACTONE) 50 MG tablet [id number]  Take 0.5 tablets (25 mg total) by mouth 2 (two) times daily. [name], MD  Active             Patient Active Problem List   Diagnosis Date Noted   Osteoporosis of femur without pathological fracture 06/24/2019   Dyspnea 02/03/2019   Asthma 02/03/2019   Vertigo 06/26/2017   Hyperparathyroidism ([place]) 08/24/2014   Slow transit constipation 01/13/2014   IBS (irritable bowel syndrome), GERD, hx colon polyps, chronic abd pain - followed by Dr. Sharlett Iles in GI 06/11/2012   Osteopenia 08/29/2011   Vitamin D deficiency 08/29/2011   GERD (gastroesophageal reflux disease) 11/23/2010   Dyslipidemia 09/09/2007   Hypertension, essential, benign 09/09/2007    Immunization History  Administered Date(s) Administered   COVID-19, mRNA, vaccine(Comirnaty)12 years and older 02/28/2022   Fluad Quad(high Dose 65+) 03/04/2019, 02/02/2020, 03/28/2021, 02/06/2022   Influenza, High Dose Seasonal PF 03/23/2015, 02/22/2016, 02/13/2017, 03/11/2018   Influenza,inj,Quad PF,6+ Mos 03/07/2013, 03/03/2014   PFIZER(Purple Top)SARS-COV-2 Vaccination 06/21/2019, 07/12/2019, 03/30/2020, 12/03/2020   Pneumococcal Conjugate-13 08/10/2014   Pneumococcal Polysaccharide-23 07/23/2012   Tdap 07/23/2012   Zoster, Live 09/30/2013   Patient reports she is still cramping from time to time in her legs at night. She hasn't tried magnesium yet but will consider it. She later defined from time to time as a couple nights a week.  Patient does feel like she is sleeping enough. She gets up to go to the bathroom several times a  night which can be bothersome. She didn't mention it to Dr. Legrand Como.  Patient does still feel dizzy some but as long as she can get up and walk she feels like she is ok. Patient reports dizziness any time she is walking around. She is still guilty of not drinking too much water.  Patient is drinking 4-5 bottles of water a day (16 ounce bottles).   Conditions to be addressed/monitored:  Hypertension, Hyperlipidemia, GERD, Asthma, and Osteoporosis  Conditions addressed this visit: Hypertension, Hyperlipidemia  Care Plan : [place]  Updates made by [name], [place] since 03/27/2022 12:00 AM     Problem: Problem: Hypertension, Hyperlipidemia, GERD, Asthma, and Osteoporosis      Long-Range Goal: Patient-Specific Goal   Start Date: 01/24/2021  Expected End Date: 01/24/2021  Recent Progress: On track  Priority: High  Note:   Current Barriers:  Unable to achieve control of blood pressure   Pharmacist Clinical Goal(s):  Patient will achieve control of blood pressure as evidenced by home and office readings  through collaboration with PharmD and provider.   Interventions: 1:1 collaboration with Farrel Conners, MD regarding development and update of comprehensive plan of care as evidenced by provider attestation and co-signature Inter-disciplinary care team collaboration (see longitudinal plan of care) Comprehensive medication review performed; medication list updated in electronic medical record  Hypertension (BP goal <140/90) -Uncontrolled per office readings but at goal per home readings -Current treatment: Spironolactone 25 mg 1/2 tablet twice daily - Appropriate, Effective, Safe, Accessible Hydralazine 25 mg 1/2 tablet three times daily - Appropriate, Effective, Safe, Accessible Carvedilol 3.125 mg 1/2 tablet twice daily - Appropriate, Effective, Safe, Accessible Furosemide 20 mg 1 tablet as needed - Appropriate, Effective, Safe, Accessible -Medications  previously tried: chlorthalidone, clonidine, atenolol, hydralazine, amlodipine (swelling), lisinopril, candesartan, azilsartan, olmesartan (change to combo), labetalol (cramping), telmisartan (headaches), metoprolol (bradycardia) -Current  home readings: 130-140s/60-70s in the evening, 150-160s in the mornings prior to medications; recently bought a new arm BP cuff -Current dietary habits: working on reading package labels -Current exercise habits: does not routinely exercise -Reports hypotensive/hypertensive symptoms -Educated on Daily salt intake goal < 2300 mg; Exercise goal of 150 minutes per week; Importance of home blood pressure monitoring; Proper BP monitoring technique; Symptoms of hypotension and importance of maintaining adequate hydration; -Counseled to monitor BP at home daily, document, and provide log at future appointments -Counseled on diet and exercise extensively Recommended to continue current medication  Fluid retention (Goal: minimize swelling) -Controlled -Current treatment  Furosemide 20 mg 1 tablet as needed - Appropriate, Effective, Safe, Accessible -Medications previously tried: none  -Recommended to continue current medication   Hyperlipidemia: (LDL goal < 100) -Controlled -Current treatment: Zetia 10 mg 1 tablet daily - Appropriate, Effective, Safe, Accessible Rosuvastatin 5 mg 1/2 tablet daily - Appropriate, Effective, Safe, Accessible -Medications previously tried: rosuvastatin (cramping)  -Current dietary patterns: eats out some -Current exercise habits: does not routinely exercise -Educated on Cholesterol goals;  Benefits of statin for ASCVD risk reduction; Exercise goal of 150 minutes per week; -Recommended to continue current medication  Osteopenia (Goal prevent fractures) -Uncontrolled -Last DEXA Scan: 03/18/2019   T-Score femoral neck: -2.6  T-Score total hip: n/a  T-Score lumbar spine: -1.0  T-Score forearm radius: -2.7  10-year  probability of major osteoporotic fracture: n/a  10-year probability of hip fracture: n/a -Patient is a candidate for pharmacologic treatment due to T-Score < -2.5 in femoral neck -Current treatment  Vitamin D 1000 units daily - Appropriate, Effective, Safe, Accessible -Medications previously tried: none  -Recommend [phone] units of vitamin D daily. Recommend weight-bearing and muscle strengthening exercises for building and maintaining bone density. -Counseled on diet and exercise extensively Recommended follow up with endocrinology.  GERD (Goal: minimize symptoms) -Uncontrolled -Current treatment  FDGard 1 capsule 3 times daily - Appropriate, Query effective, Safe, Accessible -Medications previously tried: esomeprazole, pantoprazole (stomach upset), Dexilant (cost), omeprazole (cost), Ibgard (heartburn) -Counseled on non-pharmacologic management of symptoms such as elevating the head of your bed, avoiding eating 2-3 hours before bed, avoiding triggering foods such as acidic, spicy, or fatty foods, eating smaller meals, and wearing clothes that are loose around the waist Patient reports her triggers include tomatoes and spicy foods.  Health Maintenance -Vaccine gaps: shingrix -Current therapy:  Cetirizine 10 mg 1 tablet daily (took when dizziness started) - vertigo Vitamin B12 - 1000 mcg - sublingual Acetaminophen 500 mg as needed -Educated on Cost vs benefit of each product must be carefully weighed by individual consumer -Patient is satisfied with current therapy and denies issues -Recommended to continue current medication  Patient Goals/Self-Care Activities Patient will:  - take medications as prescribed check blood pressure daily, document, and provide at future appointments target a minimum of 150 minutes of moderate intensity exercise weekly engage in dietary modifications by limiting salt intake  Follow Up Plan: Telephone follow up appointment with care management team  member scheduled for: 6 months        Medication Assistance: None required.  Patient affirms current coverage meets needs.  Compliance/Adherence/Medication fill history: Care Gaps: Shingrix  BP 130/80 03/14/22   [facility]: Rosuvastatin 5 mg - Last filled 01/24/22 90 DS at [facility] (Patient taking half tab)   Patient's preferred pharmacy is:  [facility], [place] [address] Phone: [phone] Fax:  [phone]  [facility] - [place], [place] [address] [place] [address] Phone: [phone] Fax: [phone]  [facility] [phone] - [place], [place] [facility] [address] [place] [address] Phone: [phone] Fax: [phone]   Uses pill box? Yes Pt endorses 100% compliance  We discussed: Current pharmacy is preferred with insurance plan and patient is satisfied with pharmacy services Patient decided to: Continue current medication management strategy  Care Plan and Follow Up Patient Decision:  Patient agrees to Care Plan and Follow-up.   Plan: Telephone follow up appointment with care management team member scheduled for:  6 months  Jeni Salles, PharmD, [place] Pharmacist [place] [facility] [place] [phone]

## 2022-03-27 NOTE — Patient Instructions (Signed)
Hi Manie,  It was great to catch up again! Please work on those kegel exercises we talked about to help with your frequent night time trips to the bathroom. You should notice a difference within 4-6 weeks of doing them.  Please reach out to me if you have any questions or need anything before our follow up!  Best, Maddie  Jeni Salles, PharmD, [place] Pharmacist [facility]   Visit Information   Goals Addressed   None    Patient Care Plan: CCM Pharmacy Care Plan     Problem Identified: Problem: Hypertension, Hyperlipidemia, GERD, Asthma, and Osteoporosis      Long-Range Goal: Patient-Specific Goal   Start Date: 01/24/2021  Expected End Date: 01/24/2021  Recent Progress: On track  Priority: High  Note:   Current Barriers:  Unable to achieve control of blood pressure   Pharmacist Clinical Goal(s):  Patient will achieve control of blood pressure as evidenced by home and office readings  through collaboration with PharmD and provider.   Interventions: 1:1 collaboration with Farrel Conners, MD regarding development and update of comprehensive plan of care as evidenced by provider attestation and co-signature Inter-disciplinary care team collaboration (see longitudinal plan of care) Comprehensive medication review performed; medication list updated in electronic medical record  Hypertension (BP goal <140/90) -Uncontrolled per office readings but at goal per home readings -Current treatment: Spironolactone 25 mg 1/2 tablet twice daily - Appropriate, Effective, Safe, Accessible Hydralazine 25 mg 1/2 tablet three times daily - Appropriate, Effective, Safe, Accessible Carvedilol 3.125 mg 1/2 tablet twice daily - Appropriate, Effective, Safe, Accessible Furosemide 20 mg 1 tablet as needed - Appropriate, Effective, Safe, Accessible -Medications previously tried: chlorthalidone, clonidine, atenolol, hydralazine, amlodipine (swelling),  lisinopril, candesartan, azilsartan, olmesartan (change to combo), labetalol (cramping), telmisartan (headaches), metoprolol (bradycardia) -Current home readings: 130-140s/60-70s in the evening, 150-160s in the mornings prior to medications; recently bought a new arm BP cuff -Current dietary habits: working on reading package labels -Current exercise habits: does not routinely exercise -Reports hypotensive/hypertensive symptoms -Educated on Daily salt intake goal < 2300 mg; Exercise goal of 150 minutes per week; Importance of home blood pressure monitoring; Proper BP monitoring technique; Symptoms of hypotension and importance of maintaining adequate hydration; -Counseled to monitor BP at home daily, document, and provide log at future appointments -Counseled on diet and exercise extensively Recommended to continue current medication  Fluid retention (Goal: minimize swelling) -Controlled -Current treatment  Furosemide 20 mg 1 tablet as needed - Appropriate, Effective, Safe, Accessible -Medications previously tried: none  -Recommended to continue current medication   Hyperlipidemia: (LDL goal < 100) -Controlled -Current treatment: Zetia 10 mg 1 tablet daily - Appropriate, Effective, Safe, Accessible Rosuvastatin 5 mg 1/2 tablet daily - Appropriate, Effective, Safe, Accessible -Medications previously tried: rosuvastatin (cramping)  -Current dietary patterns: eats out some -Current exercise habits: does not routinely exercise -Educated on Cholesterol goals;  Benefits of statin for ASCVD risk reduction; Exercise goal of 150 minutes per week; -Recommended to continue current medication  Osteopenia (Goal prevent fractures) -Uncontrolled -Last DEXA Scan: 03/18/2019   T-Score femoral neck: -2.6  T-Score total hip: n/a  T-Score lumbar spine: -1.0  T-Score forearm radius: -2.7  10-year probability of major osteoporotic fracture: n/a  10-year probability of hip fracture: n/a -Patient  is a candidate for pharmacologic treatment due to T-Score < -2.5 in femoral neck -Current treatment  Vitamin D 1000 units daily - Appropriate, Effective, Safe, Accessible -Medications previously tried: none  -Recommend [phone] units  of vitamin D daily. Recommend weight-bearing and muscle strengthening exercises for building and maintaining bone density. -Counseled on diet and exercise extensively Recommended follow up with endocrinology.  GERD (Goal: minimize symptoms) -Uncontrolled -Current treatment  FDGard 1 capsule 3 times daily - Appropriate, Query effective, Safe, Accessible -Medications previously tried: esomeprazole, pantoprazole (stomach upset), Dexilant (cost), omeprazole (cost), Ibgard (heartburn) -Counseled on non-pharmacologic management of symptoms such as elevating the head of your bed, avoiding eating 2-3 hours before bed, avoiding triggering foods such as acidic, spicy, or fatty foods, eating smaller meals, and wearing clothes that are loose around the waist Patient reports her triggers include tomatoes and spicy foods.  Health Maintenance -Vaccine gaps: shingrix -Current therapy:  Cetirizine 10 mg 1 tablet daily (took when dizziness started) - vertigo Vitamin B12 - 1000 mcg - sublingual Acetaminophen 500 mg as needed -Educated on Cost vs benefit of each product must be carefully weighed by individual consumer -Patient is satisfied with current therapy and denies issues -Recommended to continue current medication  Patient Goals/Self-Care Activities Patient will:  - take medications as prescribed check blood pressure daily, document, and provide at future appointments target a minimum of 150 minutes of moderate intensity exercise weekly engage in dietary modifications by limiting salt intake  Follow Up Plan: Telephone follow up appointment with care management team member scheduled for: 6 months        Patient verbalizes understanding of instructions and care  plan provided today and agrees to view in [place]. Active MyChart status and patient understanding of how to access instructions and care plan via MyChart confirmed with patient.    Telephone follow up appointment with pharmacy team member scheduled for:  [name], [facility]

## 2022-04-24 DIAGNOSIS — G43909 Migraine, unspecified, not intractable, without status migrainosus: Secondary | ICD-10-CM | POA: Diagnosis not present

## 2022-04-24 DIAGNOSIS — H43813 Vitreous degeneration, bilateral: Secondary | ICD-10-CM | POA: Diagnosis not present

## 2022-04-24 DIAGNOSIS — H04123 Dry eye syndrome of bilateral lacrimal glands: Secondary | ICD-10-CM | POA: Diagnosis not present

## 2022-04-24 DIAGNOSIS — H524 Presbyopia: Secondary | ICD-10-CM | POA: Diagnosis not present

## 2022-04-24 DIAGNOSIS — H52203 Unspecified astigmatism, bilateral: Secondary | ICD-10-CM | POA: Diagnosis not present

## 2022-04-24 DIAGNOSIS — H0100B Unspecified blepharitis left eye, upper and lower eyelids: Secondary | ICD-10-CM | POA: Diagnosis not present

## 2022-04-24 DIAGNOSIS — D3131 Benign neoplasm of right choroid: Secondary | ICD-10-CM | POA: Diagnosis not present

## 2022-04-24 DIAGNOSIS — H0100A Unspecified blepharitis right eye, upper and lower eyelids: Secondary | ICD-10-CM | POA: Diagnosis not present

## 2022-04-24 DIAGNOSIS — H18593 Other hereditary corneal dystrophies, bilateral: Secondary | ICD-10-CM | POA: Diagnosis not present

## 2022-06-05 ENCOUNTER — Encounter: Payer: Self-pay | Admitting: Gastroenterology

## 2022-06-19 ENCOUNTER — Telehealth: Payer: Self-pay | Admitting: Family Medicine

## 2022-06-19 DIAGNOSIS — E7800 Pure hypercholesterolemia, unspecified: Secondary | ICD-10-CM

## 2022-06-19 DIAGNOSIS — I10 Essential (primary) hypertension: Secondary | ICD-10-CM

## 2022-06-19 MED ORDER — EZETIMIBE 10 MG PO TABS: ORAL_TABLET | ORAL | 0 refills | Status: DC

## 2022-06-19 MED ORDER — HYDRALAZINE HCL 25 MG PO TABS: 12.5000 mg | ORAL_TABLET | Freq: Two times a day (BID) | ORAL | 2 refills | Status: DC

## 2022-06-19 NOTE — Telephone Encounter (Signed)
Pt called to request a refill of the following:  Expired - hydrALAZINE (APRESOLINE) 25 MG tablet  ezetimibe (ZETIA) 10 MG tablet   LOV:  01/02/22  Please advise.   Pt states she is almost out, so please send to the pharmacy below:  [facility], [place] Phone: [phone]  Fax: [phone]

## 2022-06-19 NOTE — Telephone Encounter (Signed)
Rx done. 

## 2022-06-22 ENCOUNTER — Ambulatory Visit: Payer: PPO | Admitting: Gastroenterology

## 2022-06-22 ENCOUNTER — Encounter: Payer: Self-pay | Admitting: Gastroenterology

## 2022-06-22 ENCOUNTER — Other Ambulatory Visit (INDEPENDENT_AMBULATORY_CARE_PROVIDER_SITE_OTHER): Payer: PPO

## 2022-06-22 VITALS — BP 164/90 | HR 76 | Ht 59.0 in | Wt 153.4 lb

## 2022-06-22 DIAGNOSIS — R42 Dizziness and giddiness: Secondary | ICD-10-CM

## 2022-06-22 DIAGNOSIS — K5900 Constipation, unspecified: Secondary | ICD-10-CM

## 2022-06-22 DIAGNOSIS — R1013 Epigastric pain: Secondary | ICD-10-CM

## 2022-06-22 DIAGNOSIS — R110 Nausea: Secondary | ICD-10-CM

## 2022-06-22 LAB — CBC WITH DIFFERENTIAL/PLATELET
Basophils Absolute: 0.1 10*3/uL (ref 0.0–0.1)
Basophils Relative: 0.8 % (ref 0.0–3.0)
Eosinophils Absolute: 0.2 10*3/uL (ref 0.0–0.7)
Eosinophils Relative: 1.9 % (ref 0.0–5.0)
HCT: 38.1 % (ref 36.0–46.0)
Hemoglobin: 13 g/dL (ref 12.0–15.0)
Lymphocytes Relative: 19.5 % (ref 12.0–46.0)
Lymphs Abs: 1.6 10*3/uL (ref 0.7–4.0)
MCHC: 34.2 g/dL (ref 30.0–36.0)
MCV: 92.7 fl (ref 78.0–100.0)
Monocytes Absolute: 0.5 10*3/uL (ref 0.1–1.0)
Monocytes Relative: 5.8 % (ref 3.0–12.0)
Neutro Abs: 6 10*3/uL (ref 1.4–7.7)
Neutrophils Relative %: 72 % (ref 43.0–77.0)
Platelets: 345 10*3/uL (ref 150.0–400.0)
RBC: 4.12 Mil/uL (ref 3.87–5.11)
RDW: 12.6 % (ref 11.5–15.5)
WBC: 8.4 10*3/uL (ref 4.0–10.5)

## 2022-06-22 LAB — COMPREHENSIVE METABOLIC PANEL
ALT: 8 U/L (ref 0–35)
AST: 13 U/L (ref 0–37)
Albumin: 4.4 g/dL (ref 3.5–5.2)
Alkaline Phosphatase: 75 U/L (ref 39–117)
BUN: 18 mg/dL (ref 6–23)
CO2: 25 mEq/L (ref 19–32)
Calcium: 11.4 mg/dL — ABNORMAL HIGH (ref 8.4–10.5)
Chloride: 104 mEq/L (ref 96–112)
Creatinine, Ser: 0.87 mg/dL (ref 0.40–1.20)
GFR: 60.3 mL/min (ref >60.00)
Glucose, Bld: 84 mg/dL (ref 70–99)
Potassium: 4 mEq/L (ref 3.5–5.1)
Sodium: 141 mEq/L (ref 135–145)
Total Bilirubin: 0.4 mg/dL (ref 0.2–1.2)
Total Protein: 6.8 g/dL (ref 6.0–8.3)

## 2022-06-22 LAB — LIPASE: Lipase: 20 U/L (ref 11.0–59.0)

## 2022-06-22 LAB — FERRITIN: Ferritin: 23.7 ng/mL (ref 10.0–291.0)

## 2022-06-22 LAB — TSH: TSH: 1.77 u[IU]/mL (ref 0.35–5.50)

## 2022-06-22 NOTE — Patient Instructions (Addendum)
Your provider has requested that you go to the basement level for lab work before leaving today. Press "B" on the elevator. The lab is located at the first door on the left as you exit the elevator.  Stoop taking dicyclomine.   Please start over the counter FDgard taking 1-2 capsules twice day as directed. We have given you some samples to get you started.   The  GI providers would like to encourage you to use [facility] to communicate with providers for non-urgent requests or questions.  Due to long hold times on the telephone, sending your provider a message by [facility] may be a faster and more efficient way to get a response.  Please allow 48 business hours for a response.  Please remember that this is for non-urgent requests.   Due to recent changes in healthcare laws, you may see the results of your imaging and laboratory studies on MyChart before your provider has had a chance to review them.  We understand that in some cases there may be results that are confusing or concerning to you. Not all laboratory results come back in the same time frame and the provider may be waiting for multiple results in order to interpret others.  Please give [place] 48 hours in order for your provider to thoroughly review all the results before contacting the office for clarification of your results.   ____________________________________________________  If your blood pressure at your visit was 140/90 or greater, please contact your primary care physician to follow up on this.  Thank you for choosing me and [facility].  Pricilla Riffle. Dagoberto Ligas., MD., [facility]

## 2022-06-22 NOTE — Progress Notes (Signed)
Assessment     Upper abdominal pain. Suspected dyspepsia. R/O ulcer, gastritis, gastroparesis. Dizziness, headaches, HTN - per PCP Constipation  GERD with history of an esophageal stricture Personal history of adenomatous colon polyps, no longer in surveillance   Recommendations    Discontinue dicyclomine due to side effects Retry FDgard 1-2 po tid 30-60 minutes ac CMP, CBC, TSH, lipase, tTG, IgA, ferritin Consider amitriptyline hs however will need PCP to review if reasonable and prescribe.  Patient seems to recall taking amitriptyline at some point in the past and she is reluctant to consider taking it. Recommended EGD however she wants to review her blood work results before considering. The risks (including bleeding, perforation, infection, missed lesions, medication reactions and possible hospitalization or surgery if complications occur), benefits, and alternatives to endoscopy with possible biopsy and possible dilation were discussed with the patient and her daughter.   Consider CTA, GES, RUQ [place], CCK-HIDA  Continue MiraLAX qd Continue Nexium 40 mg bid   HPI    This is an 89 year old female with constant upper abdominal pain exacerbated by meals, infrequent nausea, constipation now controlled on Miralax, frequent dizziness with movement and frequent headaches.  She is accompanied by her daughter.  She relates that dicyclomine 10 mg tid and 10 mg bid reduced her postprandial symptoms however they lead to worsening dizziness so she reduced to dicyclomine 10 mg daily.  She is unsure if this dose was effective in reducing her GI symptoms and unsure if her dizziness returned to baseline.  She is unsure if FDgard or IBgard were effective and she may have taken these medications while she was on dicyclomine.  Her appetite is good and her weight is stable.  Her constipation is well-controlled with daily MiraLAX.   EGD March 2022 - Benign-appearing esophageal stenosis, dilated - Small  hiatal hernia - 3 gastric polyps, biopsied (fundic gland polyps) - Normal duodenal bulb and second portion of the duodenum   Labs / Imaging    CT AP 01/02/2022 1. No acute intra-abdominal or pelvic pathology. 2. Sigmoid diverticulosis. No bowel obstruction. Normal appendix. 3. Aortic Atherosclerosis     Latest Ref Rng & Units 10/03/2021   10:22 AM 04/23/2021   12:21 PM 03/28/2021   10:56 AM  Hepatic Function  Total Protein 6.0 - 8.3 g/dL 7.1  6.7  7.3   Albumin 3.5 - 5.2 g/dL 4.5  4.1  4.6   AST 0 - 37 U/L '17  18  17   '$ ALT 0 - 35 U/L '13  13  12   '$ Alk Phosphatase 39 - 117 U/L 68  68  89   Total Bilirubin 0.2 - 1.2 mg/dL 0.7  0.8  0.6        Latest Ref Rng & Units 10/03/2021   10:22 AM 04/23/2021   12:21 PM 03/28/2021   10:56 AM  CBC  WBC 4.0 - 10.5 K/uL 7.1  8.4  6.4   Hemoglobin 12.0 - 15.0 g/dL 12.7  13.0  13.7   Hematocrit 36.0 - 46.0 % 37.8  40.4  41.0   Platelets 150.0 - 400.0 K/uL 292.0  309  311.0    Current Medications, Allergies, Past Medical History, Past Surgical History, Family History and Social History were reviewed in [employer] record.   Physical Exam: General: Well developed, well nourished, elderly, no acute distress Head: Normocephalic and atraumatic Eyes: Sclerae anicteric, EOMI Ears: Normal auditory acuity Mouth: No deformities or lesions noted Lungs:  Clear throughout to auscultation Heart: Regular rate and rhythm; No murmurs, rubs or bruits Abdomen: Soft, mild upper abdominal tenderness and non distended. No masses, hepatosplenomegaly or hernias noted. Normal Bowel sounds Rectal: Not done Musculoskeletal: Symmetrical with no gross deformities  Pulses:  Normal pulses noted Extremities: No edema or deformities noted Neurological: Alert oriented x 4, grossly nonfocal Psychological:  Alert and cooperative. Normal mood and affect   Sandy Haye T. Fuller Plan, MD 06/22/2022, 2:36 PM

## 2022-06-23 LAB — TISSUE TRANSGLUTAMINASE, IGA: (tTG) Ab, IgA: <1 U/mL

## 2022-06-23 LAB — IGA: Immunoglobulin A: 59 mg/dL — ABNORMAL LOW (ref 70–320)

## 2022-06-26 ENCOUNTER — Other Ambulatory Visit: Payer: Self-pay

## 2022-06-26 DIAGNOSIS — R1010 Upper abdominal pain, unspecified: Secondary | ICD-10-CM

## 2022-06-26 DIAGNOSIS — R110 Nausea: Secondary | ICD-10-CM

## 2022-06-28 ENCOUNTER — Other Ambulatory Visit: Payer: PPO

## 2022-06-28 DIAGNOSIS — R1010 Upper abdominal pain, unspecified: Secondary | ICD-10-CM | POA: Diagnosis not present

## 2022-06-28 DIAGNOSIS — R110 Nausea: Secondary | ICD-10-CM

## 2022-07-03 ENCOUNTER — Encounter: Payer: Self-pay | Admitting: Family Medicine

## 2022-07-03 ENCOUNTER — Ambulatory Visit (INDEPENDENT_AMBULATORY_CARE_PROVIDER_SITE_OTHER): Payer: PPO | Admitting: Family Medicine

## 2022-07-03 VITALS — BP 132/62 | HR 65 | Temp 97.8°F | Ht 59.0 in | Wt 153.7 lb

## 2022-07-03 DIAGNOSIS — I10 Essential (primary) hypertension: Secondary | ICD-10-CM

## 2022-07-03 DIAGNOSIS — Z780 Asymptomatic menopausal state: Secondary | ICD-10-CM | POA: Diagnosis not present

## 2022-07-03 DIAGNOSIS — K2100 Gastro-esophageal reflux disease with esophagitis, without bleeding: Secondary | ICD-10-CM | POA: Diagnosis not present

## 2022-07-03 DIAGNOSIS — R252 Cramp and spasm: Secondary | ICD-10-CM

## 2022-07-03 LAB — VITAMIN B12: Vitamin B-12: 275 pg/mL (ref 211–911)

## 2022-07-03 LAB — CK: Total CK: 125 U/L (ref 7–177)

## 2022-07-03 LAB — MAGNESIUM: Magnesium: 2 mg/dL (ref 1.5–2.5)

## 2022-07-03 MED ORDER — SPIRONOLACTONE 25 MG PO TABS: 25.0000 mg | ORAL_TABLET | Freq: Two times a day (BID) | ORAL | 3 refills | Status: DC

## 2022-07-03 MED ORDER — HYDRALAZINE HCL 25 MG PO TABS: 12.5000 mg | ORAL_TABLET | Freq: Two times a day (BID) | ORAL | 3 refills | Status: DC

## 2022-07-03 MED ORDER — ESOMEPRAZOLE MAGNESIUM 40 MG PO CPDR: 40.0000 mg | DELAYED_RELEASE_CAPSULE | Freq: Two times a day (BID) | ORAL | 3 refills | Status: DC

## 2022-07-03 NOTE — Progress Notes (Unsigned)
Established Patient Office Visit  Subjective   Patient ID: Robin [name], female    DOB: [birth year]  Age: 89 y.o. MRN: [id number]  Chief Complaint  Patient presents with   Medical Management of Chronic Issues    Pt is here for follow up of HTN.   HTN-- last visit her BP was very elevated. She reports compliance with her medication at home. I reviewed her medications and her home blood pressure readings which are mostly in the 120-130's.  Stomach problems-- pt reports she recently saw her GI physician for this and he ordered bloodwork. She is reporting mostly stomach pains that she is having intermittently. She does report that she continues to take the miralax every few days to help with regular BM's. Pt reports that the pain is pretty much "all the time", hurts in the epigastric region "up under her ribs", she reports chronic nausea about 50% of the time. States that she also feels dizzy often. I reviewed the note from the GI doctor, he ordered bloodwork which I have also reviewed, he wrote to consider further work up including CTA of abd, gastric emptying or HIDA scan.   Patient continues to report leg cramping, possible neuropathy in her feet, worse on the left than right. States that she gets a lot of burning in her left foot at night. This has been going on for some time, she is also reporting migraine headaches-- states she was told that she was having these by her ophthalmologist, states she had "squiggly lines" in her vision and he told her they were migraines. States she has been on amitriptyline a long time ago but states she won't take it again.    Current Outpatient Medications  Medication Instructions   acetaminophen (TYLENOL) 500-1,000 mg, Oral, Every 6 hours PRN   AMBULATORY NON FORMULARY MEDICATION Medication Name: GI Cocktail - 90 ml 2% Lidocaine, 90 ml Dicyclomine 46m/5ml, 270 ml Maalox - Take 5-10 ml every 6 hour as needed.   carvedilol (COREG) 3.125 MG tablet Take 1/2  tab PO BID   cetirizine (ZYRTEC) 10 mg, Oral, Daily   Cholecalciferol 1,000 Units, Oral, Daily   clobetasol ointment (TEMOVATE) 0.05 % Apply to affected area twice weekly.   esomeprazole (NEXIUM) 40 mg, Oral, 2 times daily before meals   ezetimibe (ZETIA) 10 MG tablet TAKE 1 BY MOUTH DAILY   furosemide (LASIX) 20 MG tablet 1 tablet by mouth every other day as needed for swelling   hydrALAZINE (APRESOLINE) 12.5 mg, Oral, 2 times daily   rosuvastatin (CRESTOR) 2.5 mg, Oral, Daily   spironolactone (ALDACTONE) 25 mg, Oral, 2 times daily    Patient Active Problem List   Diagnosis Date Noted   Osteoporosis of femur without pathological fracture 06/24/2019   Dyspnea 02/03/2019   Asthma 02/03/2019   Vertigo 06/26/2017   Hyperparathyroidism ([place] 08/24/2014   Slow transit constipation 01/13/2014   IBS (irritable bowel syndrome), GERD, hx colon polyps, chronic abd pain - followed by Dr. PSharlett Ilesin GI 06/11/2012   Osteopenia 08/29/2011   Vitamin D deficiency 08/29/2011   GERD (gastroesophageal reflux disease) 11/23/2010   Dyslipidemia 09/09/2007   Hypertension, essential, benign 09/09/2007      Review of Systems  All other systems reviewed and are negative.     Objective:     BP 132/62 (BP Location: Left Arm, Patient Position: Sitting, Cuff Size: Normal)   Pulse 65   Temp 97.8 F (36.6 C) (Oral)   Ht 4' 11"$  (1.499 m)  Wt 153 lb 11.2 oz (69.7 kg)   SpO2 98%   BMI 31.04 kg/m  BP Readings from Last 3 Encounters:  07/03/22 132/62  06/22/22 (!) 164/90  03/14/22 130/80      Physical Exam Vitals reviewed.  Constitutional:      Appearance: Normal appearance. She is well-groomed and normal weight.  Eyes:     Conjunctiva/sclera: Conjunctivae normal.  Neck:     Thyroid: No thyromegaly.  Cardiovascular:     Rate and Rhythm: Normal rate and regular rhythm.     Pulses: Normal pulses.     Heart sounds: S1 normal and S2 normal.  Pulmonary:     Effort: Pulmonary effort is  normal.     Breath sounds: Normal breath sounds and air entry.  Abdominal:     General: Bowel sounds are normal.  Musculoskeletal:     Right lower leg: No edema.     Left lower leg: No edema.  Neurological:     Mental Status: She is alert and oriented to person, place, and time. Mental status is at baseline.     Gait: Gait is intact.  Psychiatric:        Mood and Affect: Mood and affect normal.        Speech: Speech normal.        Behavior: Behavior normal.        Judgment: Judgment normal.      Results for orders placed or performed in visit on 07/03/22  Vitamin B12  Result Value Ref Range   Vitamin B-12 275 211 - 911 pg/mL  CK  Result Value Ref Range   Total CK 125 7 - 177 U/L  Magnesium  Result Value Ref Range   Magnesium 2.0 1.5 - 2.5 mg/dL      The ASCVD Risk score (Arnett DK, et al., 2019) failed to calculate for the following reasons:   The 2019 ASCVD risk score is only valid for ages 84 to 43    Assessment & Plan:   Problem List Items Addressed This Visit       Unprioritized   Hypertension, essential, benign - Primary    Current hypertension medications:       Sig   carvedilol (COREG) 3.125 MG tablet (Taking) Take 1/2 tab PO BID   furosemide (LASIX) 20 MG tablet (Taking) 1 tablet by mouth every other day as needed for swelling   hydrALAZINE (APRESOLINE) 25 MG tablet (Taking) Take 0.5 tablets (12.5 mg total) by mouth 2 (two) times daily.   spironolactone (ALDACTONE) 50 MG tablet (Taking) Take 0.5 tablets (25 mg total) by mouth 2 (two) times daily.           Relevant Medications   spironolactone (ALDACTONE) 25 MG tablet   hydrALAZINE (APRESOLINE) 25 MG tablet   GERD (gastroesophageal reflux disease)    Will continue twice daily dosing of esomeprazole 40 mg. Pt continues to have work up and evaluation by her GI specialist.       Relevant Medications   esomeprazole (NEXIUM) 40 MG capsule   Other Visit Diagnoses     Postmenopausal state        Relevant Orders   DG Bone Density   Muscle cramps       Relevant Orders   She is ona very small dose of statin. I wonder if the muscle cramps are related to this. We will check B12, CK and magneisum to rule out other causes. I advised she stop the crestor for about  2 weeks to see if the cramps resolve. If they do then she should remain off the crestor.   Vitamin B12 (Completed)   CK (Completed)   Magnesium (Completed)       Return in about 6 months (around 01/01/2023).    Farrel Conners, MD

## 2022-07-03 NOTE — Patient Instructions (Addendum)
Home blood pressures are controlled.   I would advise a trial of stopping the crestor for 2 weeks to see if the muscle cramps go away off the medication. If the muscle cramps continue after the 2 week period then the muscle cramps are NOT related to the crestor-- restart the crestor. If your muscle cramps go away off the crestor, then call Dr. Legrand Como and let me know.   You may want to try a magnesium supplements to try and help the muscle cramps. Could also try muscle rubs-- Icy hot or Biofreeze might help -- Aspercreme or even try Voltaren gel -- rub on calves and feet 30- 60 minutes before bed to see if these help.  Call your GI doctor and ask for the CT angio of the abdomen first to rule out any issues with the blood vessels in your abdomen.

## 2022-07-03 NOTE — Assessment & Plan Note (Signed)
Current hypertension medications:       Sig   carvedilol (COREG) 3.125 MG tablet (Taking) Take 1/2 tab PO BID   furosemide (LASIX) 20 MG tablet (Taking) 1 tablet by mouth every other day as needed for swelling   hydrALAZINE (APRESOLINE) 25 MG tablet (Taking) Take 0.5 tablets (12.5 mg total) by mouth 2 (two) times daily.   spironolactone (ALDACTONE) 50 MG tablet (Taking) Take 0.5 tablets (25 mg total) by mouth 2 (two) times daily.

## 2022-07-04 NOTE — Assessment & Plan Note (Signed)
Will continue twice daily dosing of esomeprazole 40 mg. Pt continues to have work up and evaluation by her GI specialist.

## 2022-07-05 LAB — CELIAC DISEASE HLA DQ ASSOC.
DQ2 (DQA1 0501/0505,DQB1 02XX): POSITIVE
DQ8 (DQA1 03XX, DQB1 0302): NEGATIVE

## 2022-07-06 ENCOUNTER — Encounter: Payer: Self-pay | Admitting: Gastroenterology

## 2022-07-26 ENCOUNTER — Encounter: Payer: Self-pay | Admitting: Family Medicine

## 2022-07-28 NOTE — Progress Notes (Unsigned)
HPI: FU dizziness and hypertension. ABIs August 2010 normal. Stress echo February 2014 normal. MRI July 2018 showed chronic small vessel ischemic changes in the cerebral white matter and pons. MRA without significant vertebrobasilar disease. CTA showed normal circle of Willis. EEG also normal. Patient has chronic mild dizziness. CTA September 2020 showed no pulmonary embolus. There was note of coronary artery disease/aortic atherosclerosis. Echocardiogram April 2022 showed normal LV function, mild mitral regurgitation; mild aortic stenosis with mean gradient 10 mmHg.  Monitor January 2023 showed sinus rhythm with PACs, brief PAT, PVCs and rare couplet.  Since last seen, there is no dyspnea, chest pain, palpitations or syncope.  Current Outpatient Medications  Medication Sig Dispense Refill   acetaminophen (TYLENOL) 500 MG tablet Take 500-1,000 mg by mouth every 6 (six) hours as needed for mild pain or headache.      AMBULATORY NON FORMULARY MEDICATION Medication Name: GI Cocktail - 90 ml 2% Lidocaine, 90 ml Dicyclomine 10mg /61ml, 270 ml Maalox - Take 5-10 ml every 6 hour as needed. 450 mL 3   carvedilol (COREG) 3.125 MG tablet Take 1/2 tab PO BID 180 tablet 3   cetirizine (ZYRTEC) 10 MG tablet Take 10 mg by mouth daily.     Cholecalciferol 25 MCG (1000 UT) TBDP Take 1,000 Units by mouth daily. 90 tablet 3   clobetasol ointment (TEMOVATE) 0.05 % Apply to affected area twice weekly. 60 g 4   esomeprazole (NEXIUM) 40 MG capsule Take 1 capsule (40 mg total) by mouth 2 (two) times daily before a meal. 180 capsule 3   ezetimibe (ZETIA) 10 MG tablet TAKE 1 BY MOUTH DAILY 90 tablet 0   furosemide (LASIX) 20 MG tablet 1 tablet by mouth every other day as needed for swelling 45 tablet 3   hydrALAZINE (APRESOLINE) 25 MG tablet Take 0.5 tablets (12.5 mg total) by mouth 2 (two) times daily. 90 tablet 3   rosuvastatin (CRESTOR) 5 MG tablet Take 0.5 tablets (2.5 mg total) by mouth daily. 45 tablet 0    spironolactone (ALDACTONE) 25 MG tablet Take 1 tablet (25 mg total) by mouth 2 (two) times daily. 180 tablet 3   No current facility-administered medications for this visit.     Past Medical History:  Diagnosis Date   Abnormal Pap smear of cervix    Allergy    Arthritis    Asthma    Cataract 2018   bilateral eyes   Colon polyps    tubulovillous adenoma, 2010   External hemorrhoids without mention of complication    GERD (gastroesophageal reflux disease)    Headache    eval with neuro in 2011   Hyperlipemia    Hyperparathyroidism ([place])    seeing Dr. Dwyane Dee in endocrinology   Hypertension    IBS (irritable bowel syndrome), GERD, hx colon polyps, chronic abd pain - followed by Dr. Sharlett Iles in GI 06/11/2012   Osteoporosis 08/29/2011   Vertigo    eval with neuro in 2011, brief recurrence 2016    Past Surgical History:  Procedure Laterality Date   BACK SURGERY  1974   CARPAL TUNNEL RELEASE Left 06/2008   COLONOSCOPY     EYE SURGERY  07/2017   bilateral cataract extraction   POLYPECTOMY     TONSILLECTOMY     UPPER GASTROINTESTINAL ENDOSCOPY      Social History   Socioeconomic History   Marital status: Widowed    Spouse name: Not on file   Number of children: 2   Years  of education: Not on file   Highest education level: Not on file  Occupational History   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  Tobacco Use   Smoking status: Never   Smokeless tobacco: Never  Vaping Use   Vaping Use: Never used  Substance and Sexual Activity   Alcohol use: No    Alcohol/week: 0.0 standard drinks of alcohol   Drug use: No   Sexual activity: Not Currently    Birth control/protection: Post-menopausal  Other Topics Concern   Not on file  Social History Narrative   Lives alone in a one story home.  Has 2 children, one son and one daughter with 2 grandkids and 1 great-grandchild.    Retired from working for a bank.     Education: high school.     Social Determinants of Health   Financial Resource Strain: Low Risk  (03/27/2022)   Overall Financial Resource Strain (CARDIA)    Difficulty of Paying Living Expenses: Not very hard  Food Insecurity: No Food Insecurity (11/28/2021)   Hunger Vital Sign    Worried About Running Out of Food in the Last Year: Never true    Ran Out of Food in the Last Year: Never true  Transportation Needs: No Transportation Needs (11/28/2021)   PRAPARE - Hydrologist (Medical): No    Lack of Transportation (Non-Medical): No  Physical Activity: Inactive (11/28/2021)   Exercise Vital Sign    Days of Exercise per Week: 0 days    Minutes of Exercise per Session: 0 min  Stress: No Stress Concern Present (11/28/2021)   [place]    Feeling of Stress : Not at all  Social Connections: Moderately Isolated (11/28/2021)   Social Connection and Isolation Panel [NHANES]    Frequency of Communication with Friends and Family: More than three times a week    Frequency of Social Gatherings with Friends and Family: More than three times a week    Attends Religious Services: Never    Marine scientist or Organizations: Yes    Attends Archivist Meetings: Never    Marital Status: Widowed  Intimate Partner Violence: Not At Risk (11/28/2021)   Humiliation, Afraid, Rape, and Kick questionnaire    Fear of Current or Ex-Partner: No    Emotionally Abused: No    Physically Abused: No    Sexually Abused: No    Family History  Problem Relation Age of Onset   CAD Mother        Died of MI at age 70   Hypertension Mother    Hypertension Father    Stroke Father    Diabetes Sister    Hypertension Sister    Heart disease Sister        heart failure   Stroke Sister 60   Hypertension Sister    Diabetes Brother    Hypertension Brother    Stroke Brother 31   CAD Brother        Died of MI at age 31   Hypertension  Brother    Stroke Brother 37   Hypertension Brother    Breast cancer Niece    Colon cancer Neg Hx    Colon polyps Neg Hx    Esophageal cancer Neg Hx    Pancreatic cancer Neg Hx    Stomach cancer Neg Hx    Rectal cancer Neg Hx  ROS: no fevers or chills, productive cough, hemoptysis, dysphasia, odynophagia, melena, hematochezia, dysuria, hematuria, rash, seizure activity, orthopnea, PND, pedal edema, claudication. Remaining systems are negative.  Physical Exam: Well-developed well-nourished in no acute distress.  Skin is warm and dry.  HEENT is normal.  Neck is supple.  Chest is clear to auscultation with normal expansion.  Cardiovascular exam is regular rate and rhythm.  2/6 systolic murmur left sternal border.  S2 is not diminished. Abdominal exam nontender or distended. No masses palpated. Extremities show no edema. neuro grossly intact  ECG-normal sinus rhythm at a rate of 69, left ventricular hypertrophy, left axis deviation.  Personally reviewed  A/P  1 hypertension-as outlined previously she has multiple medication intolerances.  At present her blood pressure is reasonable based on blood pressure checks at home.  Will continue present medications and follow.  2 coronary calcification-noted on previous CT scan.  Continue statin.  She denies chest pain.  3 aortic stenosis-we will plan follow-up study when she returns in 6 months.   4 history of syncope-no recurrences.  5 hyperlipidemia-continue Crestor and Zetia.  6 chronic dizziness-previous evaluation unrevealing.  Will follow.  Kirk Ruths, MD

## 2022-07-31 ENCOUNTER — Other Ambulatory Visit: Payer: Self-pay | Admitting: *Deleted

## 2022-07-31 MED ORDER — ROSUVASTATIN CALCIUM 5 MG PO TABS
2.5000 mg | ORAL_TABLET | Freq: Every day | ORAL | 0 refills | Status: DC
Start: 2022-07-31 — End: 2022-10-23

## 2022-08-02 ENCOUNTER — Telehealth: Payer: Self-pay | Admitting: Family Medicine

## 2022-08-02 NOTE — Telephone Encounter (Signed)
Wants to verify that patient is decreasing from one whole tablet to one half tablet.

## 2022-08-03 ENCOUNTER — Ambulatory Visit: Payer: PPO | Attending: Cardiology | Admitting: Cardiology

## 2022-08-03 ENCOUNTER — Telehealth: Payer: Self-pay | Admitting: Family Medicine

## 2022-08-03 ENCOUNTER — Encounter: Payer: Self-pay | Admitting: Cardiology

## 2022-08-03 VITALS — BP 132/86 | HR 69 | Ht 59.0 in | Wt 150.8 lb

## 2022-08-03 DIAGNOSIS — I2510 Atherosclerotic heart disease of native coronary artery without angina pectoris: Secondary | ICD-10-CM | POA: Diagnosis not present

## 2022-08-03 DIAGNOSIS — I350 Nonrheumatic aortic (valve) stenosis: Secondary | ICD-10-CM

## 2022-08-03 DIAGNOSIS — I10 Essential (primary) hypertension: Secondary | ICD-10-CM

## 2022-08-03 DIAGNOSIS — E7800 Pure hypercholesterolemia, unspecified: Secondary | ICD-10-CM | POA: Diagnosis not present

## 2022-08-03 NOTE — Telephone Encounter (Signed)
[place] [phone]  Has questions regarding:  Rx for rosuvastatin (CRESTOR) 5 MG tablet   Please return their call.

## 2022-08-03 NOTE — Telephone Encounter (Signed)
No return call made to [facility] as the medication the caller referred to was not listed-will await fax which is usual means of communications for R[facility].

## 2022-08-03 NOTE — Patient Instructions (Signed)
    Follow-Up: At [facility], you and your health needs are our priority.  As part of our continuing mission to provide you with exceptional heart care, we have created designated Provider Care Teams.  These Care Teams include your primary Cardiologist (physician) and Advanced Practice Providers (APPs -  Physician Assistants and Nurse Practitioners) who all work together to provide you with the care you need, when you need it.  We recommend signing up for the patient portal called "MyChart".  Sign up information is provided on this After Visit Summary.  MyChart is used to connect with patients for Virtual Visits (Telemedicine).  Patients are able to view lab/test results, encounter notes, upcoming appointments, etc.  Non-urgent messages can be sent to your provider as well.   To learn more about what you can do with MyChart, go to [email or web address].    Your next appointment:   6 month(s)  Provider:   Brian Crenshaw, MD      

## 2022-08-15 ENCOUNTER — Other Ambulatory Visit: Payer: PPO

## 2022-09-16 ENCOUNTER — Other Ambulatory Visit: Payer: Self-pay | Admitting: Family Medicine

## 2022-09-16 DIAGNOSIS — E7800 Pure hypercholesterolemia, unspecified: Secondary | ICD-10-CM

## 2022-09-22 ENCOUNTER — Telehealth: Payer: Self-pay

## 2022-09-22 NOTE — Progress Notes (Unsigned)
Care Management & Coordination Services Pharmacy Note  09/25/2022 Name:  Robin [name] MRN:  [id number] DOB:  05-Jul-1933  Summary: BP at goal <140/90 LDL at goal <100  Recommendations/Changes made from today's visit: -Counseled to continue to check BP daily at home and keep a log -Counseled on signs of fluid retention and when to take the lasix -Counseled on importance of adherence to statin and limiting fried foods  Follow up plan: Pharmacist visit in December   Subjective: Robin [name] is an 89 y.o. year old female who is a primary patient of Karie Georges, MD.  The care coordination team was consulted for assistance with disease management and care coordination needs.    Engaged with patient by telephone for follow up visit.  Recent office visits: 07/03/22 Nira Conn, MD - For HTN and other concerns. Hold crestor for 2 weeks to see if muscle cramps resolve. -  Phone message on 3/13 - crestor restarted. START Magnesium 400mg   Recent consult visits: 08/03/22 Olga Millers (Cardio) - HTN. No medication changes.  06/22/22 Claudette Head, MD Laurette Schimke) - Abdominal pain. STOP Dicyclomine. Start FDgard  Hospital visits: None in previous 6 months   Objective:  Lab Results  Component Value Date   CREATININE 0.87 06/22/2022   BUN 18 06/22/2022   GFR 60.30 06/22/2022   GFRNONAA >60 04/23/2021   GFRAA 74 03/16/2020   NA 141 06/22/2022   K 4.0 06/22/2022   CALCIUM 11.4 (H) 06/22/2022   CO2 25 06/22/2022   GLUCOSE 84 06/22/2022    Lab Results  Component Value Date/Time   HGBA1C 5.5 09/30/2013 09:28 AM   HGBA1C 5.4 06/19/2012 09:19 AM   GFR 60.30 06/22/2022 03:13 PM   GFR 48.84 (L) 12/26/2021 10:51 AM    Last diabetic Eye exam: No results found for: "HMDIABEYEEXA"  Last diabetic Foot exam: No results found for: "HMDIABFOOTEX"   Lab Results  Component Value Date   CHOL 166 10/03/2021   HDL 59.50 10/03/2021   LDLCALC 87 10/03/2021   TRIG 94.0 10/03/2021    CHOLHDL 3 10/03/2021       Latest Ref Rng & Units 06/22/2022    3:13 PM 10/03/2021   10:22 AM 04/23/2021   12:21 PM  Hepatic Function  Total Protein 6.0 - 8.3 g/dL 6.8  7.1  6.7   Albumin 3.5 - 5.2 g/dL 4.4  4.5  4.1   AST 0 - 37 U/L 13  17  18    ALT 0 - 35 U/L 8  13  13    Alk Phosphatase 39 - 117 U/L 75  68  68   Total Bilirubin 0.2 - 1.2 mg/dL 0.4  0.7  0.8     Lab Results  Component Value Date/Time   TSH 1.77 06/22/2022 03:13 PM   TSH 1.81 10/03/2021 10:22 AM       Latest Ref Rng & Units 06/22/2022    3:13 PM 10/03/2021   10:22 AM 04/23/2021   12:21 PM  CBC  WBC 4.0 - 10.5 K/uL 8.4  7.1  8.4   Hemoglobin 12.0 - 15.0 g/dL 40.9  81.1  91.4   Hematocrit 36.0 - 46.0 % 38.1  37.8  40.4   Platelets 150.0 - 400.0 K/uL 345.0  292.0  309     Lab Results  Component Value Date/Time   VD25OH 46.29 03/28/2021 10:56 AM   VD25OH 44.89 07/05/2020 12:33 PM   VITAMINB12 275 07/03/2022 11:54 AM   VITAMINB12 325 10/03/2021 10:22 AM  Clinical ASCVD: No  The ASCVD Risk score (Arnett DK, et al., 2019) failed to calculate for the following reasons:   The 2019 ASCVD risk score is only valid for ages 20 to 4       11/28/2021    9:38 AM 04/27/2021    8:44 AM 11/24/2020    9:34 AM  Depression screen PHQ 2/9  Decreased Interest 0 0 0  Down, Depressed, Hopeless 0 0 0  PHQ - 2 Score 0 0 0  Altered sleeping  0   Tired, decreased energy  1   Change in appetite  0   Feeling bad or failure about yourself   0   Trouble concentrating  0   Moving slowly or fidgety/restless  0   Suicidal thoughts  0   PHQ-9 Score  1      Social History   Tobacco Use  Smoking Status Never  Smokeless Tobacco Never   BP Readings from Last 3 Encounters:  08/03/22 132/86  07/03/22 132/62  06/22/22 (!) 164/90   Pulse Readings from Last 3 Encounters:  08/03/22 69  07/03/22 65  06/22/22 76   Wt Readings from Last 3 Encounters:  08/03/22 150 lb 12.8 oz (68.4 kg)  07/03/22 153 lb 11.2 oz (69.7 kg)   06/22/22 153 lb 6 oz (69.6 kg)   BMI Readings from Last 3 Encounters:  08/03/22 30.46 kg/m  07/03/22 31.04 kg/m  06/22/22 30.98 kg/m    Allergies  Allergen Reactions   Prilosec [Omeprazole] Cough   Amlodipine     Feet and ankle swelling   Atenolol     Did not help lower bp   [employer] Hcl]     Severe stomach pain   Chlorthalidone     Cramps, low back pain and itchy scalp   Clonidine Derivatives     fainting   Losartan     Did not lower bp   Protonix [Pantoprazole Sodium] Swelling   Amoxicillin Rash   Flagyl [Metronidazole] Rash    Medications Reviewed Today     Reviewed by Sherrill Raring, RPH (Pharmacist) on 09/25/22 at [phone]  Med List Status: <None>   Medication Order Taking? Sig Documenting Provider Last Dose Status Informant  acetaminophen (TYLENOL) 500 MG tablet [id number] Yes Take 500-1,000 mg by mouth every 6 (six) hours as needed for mild pain or headache.  [provider] Taking Active Self  AMBULATORY NON FORMULARY MEDICATION [id number]  Medication Name: GI Cocktail - 90 ml 2% Lidocaine, 90 ml Dicyclomine 10mg /51ml, 270 ml Maalox - Take 5-10 ml every 6 hour as needed. Unk Lightning, [place]  Active   carvedilol (COREG) 3.125 MG tablet [id number] Yes Take 1/2 tab PO BID [name], Paris Lore, MD Taking Active   cetirizine (ZYRTEC) 10 MG tablet [id number] Yes Take 10 mg by mouth daily. [provider] Taking Active Self  Cholecalciferol 25 MCG (1000 UT) TBDP [id number] Yes Take 1,000 Units by mouth daily. Romero Belling, MD Taking Active Self  clobetasol ointment (TEMOVATE) 0.05 % [id number]  Apply to affected area twice weekly. Genia Del, MD  Active   esomeprazole (NEXIUM) 40 MG capsule [id number] Yes Take 1 capsule (40 mg total) by mouth 2 (two) times daily before a meal. Karie Georges, MD Taking Active   ezetimibe (ZETIA) 10 MG tablet [id number] Yes TAKE 1 TABLET BY MOUTH DAILY Karie Georges, MD Taking Active    furosemide (LASIX) 20 MG tablet [id number] Yes 1 tablet by mouth every other day  as needed for swelling Lewayne Bunting, MD Taking Active   hydrALAZINE (APRESOLINE) 25 MG tablet [id number] Yes Take 0.5 tablets (12.5 mg total) by mouth 2 (two) times daily. Karie Georges, MD Taking Active   magnesium oxide (MAG-OX) 400 (240 Mg) MG tablet [id number] Yes Take 400 mg by mouth daily. [provider]  Active Self  rosuvastatin (CRESTOR) 5 MG tablet [id number] Yes Take 0.5 tablets (2.5 mg total) by mouth daily. Karie Georges, MD Taking Active   spironolactone (ALDACTONE) 25 MG tablet [id number] Yes Take 1 tablet (25 mg total) by mouth 2 (two) times daily. Karie Georges, MD Taking Active             SDOH:  (Social Determinants of Health) assessments and interventions performed: Yes SDOH Interventions    Flowsheet Row Care Coordination from 09/25/2022 in [facility] Chronic Care Management from 03/27/2022 in [facility] [place] Clinical Support from 11/28/2021 in [facility] [place] Chronic Care Management from 01/24/2021 in [facility] at [place] Clinical Support from 11/24/2019 in [facility] [place] Clinical Support from 07/15/2018 in [facility] [place]  SDOH Interventions        Food Insecurity Interventions Intervention Not Indicated -- Intervention Not Indicated -- Intervention Not Indicated --  Housing Interventions Intervention Not Indicated -- Intervention Not Indicated -- Intervention Not Indicated --  Transportation Interventions -- -- Intervention Not Indicated Intervention Not Indicated Intervention Not Indicated --  Depression Interventions/Treatment  -- -- -- -- PHQ2-9 Score <4 Follow-up Not Indicated PHQ2-9 Score <4 Follow-up Not Indicated  Financial Strain Interventions -- Intervention Not Indicated Intervention Not Indicated Intervention  Not Indicated Intervention Not Indicated --  Physical Activity Interventions -- -- Intervention Not Indicated -- --  [Patient states that has issues with her back but plans on working on physical activity in her home] --  Stress Interventions -- -- Intervention Not Indicated -- Intervention Not Indicated --  Social Connections Interventions -- -- Intervention Not Indicated -- Intervention Not Indicated --       Medication Assistance: None required.  Patient affirms current coverage meets needs.  Medication Access: Within the past 30 days, how often has patient missed a dose of medication? None Is a pillbox or other method used to improve adherence? Yes  Factors that may affect medication adherence? no barriers identified Are meds synced by current pharmacy? No  Are meds delivered by current pharmacy? Yes  Does patient experience delays in picking up medications due to transportation concerns? No   [facility] Reviewed: Is patient disadvantaged to use [facility]?: No  Current Rx insurance plan: HTA Name and location of Current pharmacy:  [facility] - [place], [place] - [address] [place] [address] Phone: [phone] Fax: [phone]  [facility] - [place], [place] - [address] [place] [address] Phone: [phone] Fax: [phone]  [facility] [place], [place] - [address] [place] [address] [place] [address] Phone: [phone] Fax: [phone]  [facility] services reviewed with patient today?: No  Patient requests to transfer care to [facility]?: No  Reason patient declined to change pharmacies: Not mentioned at this visit  Compliance/Adherence/Medication fill history: Care Gaps: TDAP - Overdue Zoster Vaccine - Postponed AWV - 11/28/21  Star-Rating Drugs: Rosuvastatin 5 mg  -  Last filled 08/08/22 90 DS at [facility]    Assessment/Plan Hypertension (BP goal <140/90) -Controlled -Current treatment: Spironolactone 25mg  1 BID Appropriate, Effective, Safe, Accessible Hydralazine 25mg   1/2 tab BID Appropriate, Effective, Safe, Accessible Lasix 20mg  1 qod prn swelling Appropriate, Effective, Safe, Accessible Carvedilol 3.125mg  1/2 tab BID Appropriate, Effective, Safe, Accessible -Medications previously tried: Amlodipine, Atenolol, Chlorthalidone, Clonidine, HCTZ, Lisinopril, Losartan, Metoprolol, Telmisartan -Current home readings: checks daily, reports <140/90 5/7: 129/70, 5/8 131/65 -Current dietary habits: aware to watch salt intake -Current exercise habits: Housework -Denies hypotensive/hypertensive symptoms -Educated on BP goals and benefits of medications for prevention of heart attack, stroke and kidney damage; Exercise goal of 150 minutes per week; Importance of home blood pressure monitoring; Proper BP monitoring technique; -Counseled to monitor BP at home daily, document, and provide log at future appointments -Recommended to continue current medication  Hyperlipidemia: (LDL goal < 100) -Controlled -Current treatment: Rosuvastatin 5mg  1/2 tab qd Appropriate, Effective, Safe, Accessible Zetia 10mg  1 qd Appropriate, Effective, Safe, Accessible -Medications previously tried: None  -Current dietary patterns: mindful of limiting fried foods -Current exercise habits: see above -Educated on Cholesterol goals;  Benefits of statin for ASCVD risk reduction; Importance of limiting foods high in cholesterol; -Counseled on diet and exercise extensively Recommended to continue current medication  Sherrill Raring Clinical Pharmacist [phone]

## 2022-09-22 NOTE — Progress Notes (Signed)
Patient ID: Robin [name], female   DOB: 12-18-1933, 89 y.o.   MRN: [id number]  Care Management & Coordination Services Pharmacy Team  Reason for Encounter: Appointment Reminder  Contacted patient to confirm telephone appointment with Milas Kocher, PharmD on 09/25/22 at 9:30. Spoke with patient on 09/22/2022     Star Rating Drugs:  Rosuvastatin 5 mg - Last filled 08/08/22 90 DS at [facility]    Care Gaps: TDAP - Overdue Zoster Vaccine - Postponed AWV - 11/28/21   Pamala Duffel CMA Clinical Pharmacist Assistant [phone]

## 2022-09-25 ENCOUNTER — Ambulatory Visit: Payer: PPO

## 2022-10-03 ENCOUNTER — Telehealth: Payer: Self-pay | Admitting: Family Medicine

## 2022-10-03 ENCOUNTER — Ambulatory Visit (INDEPENDENT_AMBULATORY_CARE_PROVIDER_SITE_OTHER): Payer: PPO

## 2022-10-03 ENCOUNTER — Ambulatory Visit (INDEPENDENT_AMBULATORY_CARE_PROVIDER_SITE_OTHER): Payer: PPO | Admitting: Family Medicine

## 2022-10-03 ENCOUNTER — Encounter: Payer: Self-pay | Admitting: Family Medicine

## 2022-10-03 VITALS — BP 128/80 | HR 79 | Resp 16 | Ht 59.0 in | Wt 150.4 lb

## 2022-10-03 DIAGNOSIS — M7541 Impingement syndrome of right shoulder: Secondary | ICD-10-CM

## 2022-10-03 DIAGNOSIS — M19011 Primary osteoarthritis, right shoulder: Secondary | ICD-10-CM | POA: Diagnosis not present

## 2022-10-03 DIAGNOSIS — M542 Cervicalgia: Secondary | ICD-10-CM | POA: Diagnosis not present

## 2022-10-03 MED ORDER — TRAMADOL HCL 50 MG PO TABS: 50.0000 mg | ORAL_TABLET | Freq: Two times a day (BID) | ORAL | 0 refills | Status: AC | PRN

## 2022-10-03 NOTE — Progress Notes (Unsigned)
ACUTE VISIT Chief Complaint  Patient presents with   Shoulder Pain    Right shoulder x 3 weeks, thought she may have slept wrong but pain has continued. Denies numbness or tingling. No known injury.    HPI: Ms.Storie R Ziman is a 89 y.o. female, who is here today complaining of *** Shoulder Pain  The pain is present in the right shoulder. This is a new problem. The current episode started 1 to 4 weeks ago. There has been no history of extremity trauma. Pertinent negatives include no fever, inability to bear weight, itching, joint locking, joint swelling, numbness or tingling.    Review of Systems  Constitutional:  Negative for fever.  Skin:  Negative for itching.  Neurological:  Negative for tingling and numbness.  See other pertinent positives and negatives in HPI.  Current Outpatient Medications on File Prior to Visit  Medication Sig Dispense Refill   acetaminophen (TYLENOL) 500 MG tablet Take 500-1,000 mg by mouth every 6 (six) hours as needed for mild pain or headache.      AMBULATORY NON FORMULARY MEDICATION Medication Name: GI Cocktail - 90 ml 2% Lidocaine, 90 ml Dicyclomine 10mg /51ml, 270 ml Maalox - Take 5-10 ml every 6 hour as needed. 450 mL 3   carvedilol (COREG) 3.125 MG tablet Take 1/2 tab PO BID 180 tablet 3   cetirizine (ZYRTEC) 10 MG tablet Take 10 mg by mouth daily.     Cholecalciferol 25 MCG (1000 UT) TBDP Take 1,000 Units by mouth daily. 90 tablet 3   clobetasol ointment (TEMOVATE) 0.05 % Apply to affected area twice weekly. 60 g 4   esomeprazole (NEXIUM) 40 MG capsule Take 1 capsule (40 mg total) by mouth 2 (two) times daily before a meal. 180 capsule 3   ezetimibe (ZETIA) 10 MG tablet TAKE 1 TABLET BY MOUTH DAILY 90 tablet 1   furosemide (LASIX) 20 MG tablet 1 tablet by mouth every other day as needed for swelling 45 tablet 3   hydrALAZINE (APRESOLINE) 25 MG tablet Take 0.5 tablets (12.5 mg total) by mouth 2 (two) times daily. 90 tablet 3   magnesium oxide  (MAG-OX) 400 (240 Mg) MG tablet Take 400 mg by mouth daily.     rosuvastatin (CRESTOR) 5 MG tablet Take 0.5 tablets (2.5 mg total) by mouth daily. 45 tablet 0   spironolactone (ALDACTONE) 25 MG tablet Take 1 tablet (25 mg total) by mouth 2 (two) times daily. 180 tablet 3   No current facility-administered medications on file prior to visit.    Past Medical History:  Diagnosis Date   Abnormal Pap smear of cervix    Allergy    Arthritis    Asthma    Cataract 2018   bilateral eyes   Colon polyps    tubulovillous adenoma, 2010   External hemorrhoids without mention of complication    GERD (gastroesophageal reflux disease)    Headache    eval with neuro in 2011   Hyperlipemia    Hyperparathyroidism (HCC)    seeing Dr. Lucianne Muss in endocrinology   Hypertension    IBS (irritable bowel syndrome), GERD, hx colon polyps, chronic abd pain - followed by Dr. Jarold Motto in GI 06/11/2012   Osteoporosis 08/29/2011   Vertigo    eval with neuro in 2011, brief recurrence 2016   Allergies  Allergen Reactions   Prilosec [Omeprazole] Cough   Amlodipine     Feet and ankle swelling   Atenolol     Did not help lower  bp   [employer] Hcl]     Severe stomach pain   Chlorthalidone     Cramps, low back pain and itchy scalp   Clonidine Derivatives     fainting   Losartan     Did not lower bp   Protonix [Pantoprazole Sodium] Swelling   Amoxicillin Rash   Flagyl [Metronidazole] Rash    Social History   Socioeconomic History   Marital status: Widowed    Spouse name: Not on file   Number of children: 2   Years of education: Not on file   Highest education level: Not on file  Occupational History   Occupation: Engineer, structural    Comment: retired    Associate Professor: RETIRED  Tobacco Use   Smoking status: Never   Smokeless tobacco: Never  Vaping Use   Vaping Use: Never used  Substance and Sexual Activity   Alcohol use: No    Alcohol/week: 0.0 standard drinks of alcohol   Drug use: No    Sexual activity: Not Currently    Birth control/protection: Post-menopausal  Other Topics Concern   Not on file  Social History Narrative   Lives alone in a one story home.  Has 2 children, one son and one daughter with 2 grandkids and 1 great-grandchild.    Retired from working for a bank.     Education: high school.    Social Determinants of Health   Financial Resource Strain: Low Risk  (03/27/2022)   Overall Financial Resource Strain (CARDIA)    Difficulty of Paying Living Expenses: Not very hard  Food Insecurity: No Food Insecurity (09/25/2022)   Hunger Vital Sign    Worried About Running Out of Food in the Last Year: Never true    Ran Out of Food in the Last Year: Never true  Transportation Needs: No Transportation Needs (11/28/2021)   PRAPARE - Administrator, Civil Service (Medical): No    Lack of Transportation (Non-Medical): No  Physical Activity: Inactive (11/28/2021)   Exercise Vital Sign    Days of Exercise per Week: 0 days    Minutes of Exercise per Session: 0 min  Stress: No Stress Concern Present (11/28/2021)   [name] of Occupational Health - Occupational Stress Questionnaire    Feeling of Stress : Not at all  Social Connections: Moderately Isolated (11/28/2021)   Social Connection and Isolation Panel [NHANES]    Frequency of Communication with Friends and Family: More than three times a week    Frequency of Social Gatherings with Friends and Family: More than three times a week    Attends Religious Services: Never    Database administrator or Organizations: Yes    Attends Banker Meetings: Never    Marital Status: Widowed   Vitals:   10/03/22 1509  BP: 128/80  Pulse: 79  Resp: 16  SpO2: 95%   Body mass index is 30.37 kg/m.  Physical Exam Vitals and nursing note reviewed.  Constitutional:      General: She is not in acute distress.    Appearance: She is well-developed. She is not ill-appearing.  HENT:     Head:  Normocephalic and atraumatic.  Eyes:     Conjunctiva/sclera: Conjunctivae normal.  Cardiovascular:     Rate and Rhythm: Normal rate and regular rhythm.     Heart sounds: Murmur (SEM LUSB I/VI) heard.  Pulmonary:     Effort: Pulmonary effort is normal. No respiratory distress.  Musculoskeletal:  Right shoulder: Tenderness present. No effusion. Decreased range of motion.     Cervical back: Spasms and tenderness present. Pain with movement present. Decreased range of motion.     Thoracic back: Tenderness present.       Back:     Comments: Right shoulder Shoulder: No deformity, edema, or erythema appreciated.No muscle atrophy. Juanetta Gosling' test***, drop arm rotator cuff test***, empty can supraspinatus test ***, cross body adduction test ***, lift-Off Subscapularis test ***.  Skin:    General: Skin is warm.     Findings: No erythema or rash.  Neurological:     Mental Status: She is alert and oriented to person, place, and time.   ASSESSMENT AND PLAN:  Ms. Ramaswamy was seen today for right shoulder and neck pain.  Neck pain on right side New problem that has been persistent for 3 weeks. Cervical x-ray ordered today. Due to comorbidities, there are limitations in regard to pain management.  Recommend tramadol 50 mg twice daily as needed, we discussed some side effects. Continue range of motion exercises. PT will be arranged. Continue topical IcyHot.  -     traMADol HCl; Take 1 tablet (50 mg total) by mouth every 12 (twelve) hours as needed for up to 7 days.  Dispense: 14 tablet; Refill: 0 -     Ambulatory referral to Physical Therapy -     DG Cervical Spine Complete; Future  Impingement syndrome of right shoulder We discussed differential diagnosis. Shoulder x-ray ordered today. PT will be arranged. Follow-up with PCP in 2 months, before if needed.  -     traMADol HCl; Take 1 tablet (50 mg total) by mouth every 12 (twelve) hours as needed for up to 7 days.  Dispense: 14 tablet;  Refill: 0 -     Ambulatory referral to Physical Therapy -     DG Shoulder Right; Future  Return in about 2 months (around 12/03/2022) for shoulder and upper back pain with PCP.  Samara G. [place], MD  [facility]. [place] [facility].

## 2022-10-03 NOTE — Telephone Encounter (Signed)
Triage Nurse call and stated she need this pt seen in a hour and need someone to call this pt back,

## 2022-10-03 NOTE — Telephone Encounter (Signed)
I called the patient for more information.  Patient complains of right shoulder pain for the past 3 weeks.  States she initially suspected "she slept wrong", tried Icy hot, Biofreeze and Tylenol with no relief.  Denies an injury, shortness of breath, chest pain or any other symptoms.  Appt scheduled for today with Dr Swaziland to arrive at 3:15pm.

## 2022-10-03 NOTE — Patient Instructions (Signed)
A few things to remember from today's visit:  Neck pain on right side - Plan: traMADol (ULTRAM) 50 MG tablet, Ambulatory referral to Physical Therapy, DG Cervical Spine Complete  Impingement syndrome of right shoulder - Plan: traMADol (ULTRAM) 50 MG tablet, Ambulatory referral to Physical Therapy, DG Shoulder Right Continue topical icy hot and Tylenol. Tramadol can be taken up to 2 times daily to help with pain, can cause drowsiness.  If you need refills for medications you take chronically, please call your pharmacy. Do not use My Chart to request refills or for acute issues that need immediate attention. If you send a my chart message, it may take a few days to be addressed, specially if I am not in the office.  Please be sure medication list is accurate. If a new problem present, please set up appointment sooner than planned today.

## 2022-10-18 ENCOUNTER — Ambulatory Visit: Payer: PPO | Admitting: Family Medicine

## 2022-10-23 ENCOUNTER — Other Ambulatory Visit: Payer: Self-pay | Admitting: *Deleted

## 2022-10-23 MED ORDER — ROSUVASTATIN CALCIUM 5 MG PO TABS: 2.5000 mg | ORAL_TABLET | Freq: Every day | ORAL | 0 refills | Status: DC

## 2022-10-23 NOTE — Telephone Encounter (Signed)
Rx done. 

## 2022-10-26 ENCOUNTER — Ambulatory Visit: Payer: PPO | Attending: Family Medicine | Admitting: Physical Therapy

## 2022-10-26 ENCOUNTER — Encounter: Payer: Self-pay | Admitting: Physical Therapy

## 2022-10-26 ENCOUNTER — Other Ambulatory Visit: Payer: Self-pay

## 2022-10-26 DIAGNOSIS — M542 Cervicalgia: Secondary | ICD-10-CM | POA: Diagnosis not present

## 2022-10-26 DIAGNOSIS — M7541 Impingement syndrome of right shoulder: Secondary | ICD-10-CM | POA: Insufficient documentation

## 2022-10-26 DIAGNOSIS — M6281 Muscle weakness (generalized): Secondary | ICD-10-CM | POA: Diagnosis not present

## 2022-10-26 NOTE — Therapy (Signed)
OUTPATIENT PHYSICAL THERAPY SHOULDER EVALUATION   Patient Name: Robin [name] MRN: [id number] DOB:[birth year], 89 y.o., female Today's Date: 10/27/2022   PT End of Session - 10/26/22 1322     Visit Number 1    Number of Visits --   1-2x/week   Date for PT Re-Evaluation 12/21/22    Authorization Type HTA - FOTO    PT Start Time 1000    PT Stop Time 1040    PT Time Calculation (min) 40 min             Past Medical History:  Diagnosis Date   Abnormal Pap smear of cervix    Allergy    Arthritis    Asthma    Cataract 2018   bilateral eyes   Colon polyps    tubulovillous adenoma, 2010   External hemorrhoids without mention of complication    GERD (gastroesophageal reflux disease)    Headache    eval with neuro in 2011   Hyperlipemia    Hyperparathyroidism (HCC)    seeing Dr. Lucianne Muss in endocrinology   Hypertension    IBS (irritable bowel syndrome), GERD, hx colon polyps, chronic abd pain - followed by Dr. Jarold Motto in GI 06/11/2012   Osteoporosis 08/29/2011   Vertigo    eval with neuro in 2011, brief recurrence 2016   Past Surgical History:  Procedure Laterality Date   BACK SURGERY  1974   CARPAL TUNNEL RELEASE Left 06/2008   COLONOSCOPY     EYE SURGERY  07/2017   bilateral cataract extraction   POLYPECTOMY     TONSILLECTOMY     UPPER GASTROINTESTINAL ENDOSCOPY     Patient Active Problem List   Diagnosis Date Noted   Osteoporosis of femur without pathological fracture 06/24/2019   Dyspnea 02/03/2019   Asthma 02/03/2019   Vertigo 06/26/2017   Hyperparathyroidism (HCC) 08/24/2014   Slow transit constipation 01/13/2014   IBS (irritable bowel syndrome), GERD, hx colon polyps, chronic abd pain - followed by Dr. Jarold Motto in GI 06/11/2012   Osteopenia 08/29/2011   Vitamin D deficiency 08/29/2011   GERD (gastroesophageal reflux disease) 11/23/2010   Dyslipidemia 09/09/2007   Hypertension, essential, benign 09/09/2007    PCP: Karie Georges, MD  REFERRING  PROVIDER: [place], Demisha G, MD  THERAPY DIAG:  Cervicalgia  Muscle weakness  REFERRING DIAG: Neck pain on right side [M54.2], Impingement syndrome of right shoulder [M75.41]   Rationale for Evaluation and Treatment:  Rehabilitation  SUBJECTIVE:  PERTINENT PAST HISTORY:  Osteoporosis, vertigo      PRECAUTIONS: None  WEIGHT BEARING RESTRICTIONS No  FALLS:  Has patient fallen in last 6 months? No, Number of falls: 0  MOI/History of condition:  Onset date: ~ 3 months  SUBJECTIVE STATEMENT  Robin [name] is a 89 y.o. female who presents to clinic with chief complaint of R shoulder and neck pain which started about 3 months ago with no known injury or trauma.  The pain is located on the R side of the neck and R shoulder.  The pain is now significantly improved but still present.  She denies radicular sxs.  She has pain with reaching activities.  She is now having some pain in the L shoulder as well.  Neck movements do impact the R sided neck pain.   Red flags:  denies bil numbness and tingling  Pain:  Are you having pain? Yes Pain location: R sided neck pain and R>L shoulder pain  NPRS scale:  0/10 to 5/10 Aggravating  factors: lifting objects, reaching OH, L rotation of Cx spine Relieving factors: rest Pain description: aching Stage: Chronic Stability: getting better 24 hour pattern: worse at night   Occupation: NA  Assistive Device: NA  Hand Dominance: R  Patient Goals/Specific Activities: reduce pain, improve reaching    OBJECTIVE:   DIAGNOSTIC FINDINGS:  R shoulder x-rays clear, Cx x-ray shows degenerative changes and possible stenosis but is limited d/t quality  GENERAL OBSERVATION:  Rounded shoulder posture     SENSATION:  Light touch: Appears intact   PALPATION: TTP bil UT and scalenes   Cervical ROM  ROM ROM  (Eval)  Flexion 30  Extension 10*  Right lateral flexion 20*  Left lateral flexion 30*  Right rotation 30*  Left rotation 40   Flexion rotation (normal is 30 degrees)   Flexion rotation (normal is 30 degrees)     (Blank rows = not tested, N = WNL, * = concordant pain)   UPPER EXTREMITY AROM:  ROM Right 10/27/2022 Left 10/27/2022  Shoulder flexion 110 neck pain 105 neck pain  Shoulder abduction    Shoulder internal rotation    Shoulder external rotation    Functional IR L3 neck pain L3 neck pain  Functional ER T3 neck pain T3 neck pain  Shoulder extension    Elbow extension    Elbow flexion     (Blank rows = not tested, N = WNL, * = concordant pain with testing)     UPPER EXTREMITY MMT:  MMT Right 10/27/2022 Left 10/27/2022  Shoulder flexion    Shoulder abduction (C5)    Shoulder ER N neck pain N neck pain  Shoulder IR N neck pain N neck pain  Middle trapezius    Lower trapezius    Shoulder extension    Grip strength    Shoulder shrug (C4)    Elbow flexion (C6)    Elbow ext (C7)    Thumb ext (C8)    Finger abd (T1)    Grossly     (Blank rows = not tested, score listed is out of 5 possible points.  N = WNL, D = diminished, C = clear for gross weakness with myotome testing, * = concordant pain with testing)   SPECIAL TESTS:  Spurling (+) for neck pain, not for radicular pain  JOINT MOBILITY TESTING:  Hypomobile throughout Cx spine  PATIENT SURVEYS:  FOTO: 47 -> 62    TODAY'S TREATMENT:  Creating, reviewing, and completing below HEP    PATIENT EDUCATION:  POC, diagnosis, prognosis, HEP, and outcome measures.  Pt educated via explanation, demonstration, and handout (HEP).  Pt confirms understanding verbally.    HOME EXERCISE PROGRAM: Access Code: [email or web address] URL: [email or web address] Date: 10/26/2022 Prepared by: Alphonzo Severance  Exercises - Standing Bilateral Low Shoulder Row with Anchored Resistance  - 2 x daily - 7 x weekly - 3 sets - 10 reps - Cervical Extension AROM with Strap  - 2 x daily - 7 x weekly - 2 sets - 10 reps  Treatment priorities   Eval         STM UT, LS, scalenes, cervical paraspinals        Gentle Cx mobs as tolerated                                   ASSESSMENT:  CLINICAL IMPRESSION: Robin [name] is a 89 y.o. female who presents to clinic with signs and sxs  consistent with neck pain which radiates to bil soulders.  DD includes SAPS, myofacial pain, cervical radiculopathy or stenosis, arthritic type changes.  Ruling down SAPS as shoulder MMT causes more pain in neck than shoulder; shoulder is relatively strong without OH reaching.  No clear signs of radiculopathy at this point with no radiating pain or numbness.  There does appear to be myofacial component with reduction in pain following STM.  Hypo mobile Cx spine with mechanical pain increases likelihood of degenerative changes which are confirmed on X-ray.    OBJECTIVE IMPAIRMENTS: Pain, cervical ROM, shoulder strength  ACTIVITY LIMITATIONS: lifting, reaching, housework  PERSONAL FACTORS: See medical history and pertinent history   REHAB POTENTIAL: Good  CLINICAL DECISION MAKING: Stable/uncomplicated  EVALUATION COMPLEXITY: Low   GOALS:   SHORT TERM GOALS: Target date: 11/23/2022   Kasiah will be >75% HEP compliant to improve carryover between sessions and facilitate independent management of condition  Evaluation: ongoing Goal status: INITIAL   LONG TERM GOALS: Target date: 12/21/2022   Robin [name] will improve FOTO score to 62 as a proxy for functional improvement  Evaluation/Baseline: 47 Goal status: INITIAL   2.  Robin [name] will be able to reach repeatedly into kitchen cabinet with light objects such as a plate, not limited by pain  Evaluation/Baseline: limited Goal status: INITIAL   3.  Robin [name] will self report >/= 50% decrease in pain from evaluation   Evaluation/Baseline: 5/10 max pain Goal status: INITIAL   4.  Robin [name] will report confidence in self management of condition at time of discharge with advanced HEP  Evaluation/Baseline: unable to self  manage Goal status: INITIAL    PLAN: PT FREQUENCY: 1-2x/week  PT DURATION: 8 weeks  PLANNED INTERVENTIONS: Therapeutic exercises, Aquatic therapy, Therapeutic activity, Neuro Muscular re-education, Gait training, Patient/Family education, Joint mobilization, Dry Needling, Electrical stimulation, Spinal mobilization and/or manipulation, Moist heat, Taping, Vasopneumatic device, Ionotophoresis 4mg /ml Dexamethasone, and Manual therapy   Alphonzo Severance PT, DPT 10/27/2022, 8:04 AM

## 2022-10-30 ENCOUNTER — Encounter: Payer: Self-pay | Admitting: Family Medicine

## 2022-10-30 DIAGNOSIS — M79604 Pain in right leg: Secondary | ICD-10-CM

## 2022-10-31 ENCOUNTER — Encounter: Payer: Self-pay | Admitting: Family Medicine

## 2022-11-01 NOTE — Telephone Encounter (Signed)
Ok to place referral to [facility] for the EMG for bilateral leg pain dx.

## 2022-11-02 NOTE — Telephone Encounter (Signed)
Yes that's ok too

## 2022-11-08 MED ORDER — BACLOFEN 5 MG PO TABS: 1.0000 | ORAL_TABLET | Freq: Two times a day (BID) | ORAL | 1 refills | Status: DC | PRN

## 2022-11-09 ENCOUNTER — Ambulatory Visit: Payer: PPO

## 2022-11-09 DIAGNOSIS — M542 Cervicalgia: Secondary | ICD-10-CM

## 2022-11-09 DIAGNOSIS — M6281 Muscle weakness (generalized): Secondary | ICD-10-CM

## 2022-11-09 NOTE — Therapy (Signed)
OUTPATIENT PHYSICAL THERAPY TREATMENT NOTE   Patient Name: Robin [name] MRN: [id number] DOB:[birth year], 89 y.o., female Today's Date: 11/09/2022   PT End of Session - 11/09/22 1118     Visit Number 2    Date for PT Re-Evaluation 12/21/22    Authorization Type HTA - FOTO    PT Start Time 1125    PT Stop Time 1207    PT Time Calculation (min) 42 min    Activity Tolerance Patient tolerated treatment well    Behavior During Therapy [facility] for tasks assessed/performed              Past Medical History:  Diagnosis Date   Abnormal Pap smear of cervix    Allergy    Arthritis    Asthma    Cataract 2018   bilateral eyes   Colon polyps    tubulovillous adenoma, 2010   External hemorrhoids without mention of complication    GERD (gastroesophageal reflux disease)    Headache    eval with neuro in 2011   Hyperlipemia    Hyperparathyroidism (HCC)    seeing Dr. Lucianne Muss in endocrinology   Hypertension    IBS (irritable bowel syndrome), GERD, hx colon polyps, chronic abd pain - followed by Dr. Jarold Motto in GI 06/11/2012   Osteoporosis 08/29/2011   Vertigo    eval with neuro in 2011, brief recurrence 2016   Past Surgical History:  Procedure Laterality Date   BACK SURGERY  1974   CARPAL TUNNEL RELEASE Left 06/2008   COLONOSCOPY     EYE SURGERY  07/2017   bilateral cataract extraction   POLYPECTOMY     TONSILLECTOMY     UPPER GASTROINTESTINAL ENDOSCOPY     Patient Active Problem List   Diagnosis Date Noted   Osteoporosis of femur without pathological fracture 06/24/2019   Dyspnea 02/03/2019   Asthma 02/03/2019   Vertigo 06/26/2017   Hyperparathyroidism (HCC) 08/24/2014   Slow transit constipation 01/13/2014   IBS (irritable bowel syndrome), GERD, hx colon polyps, chronic abd pain - followed by Dr. Jarold Motto in GI 06/11/2012   Osteopenia 08/29/2011   Vitamin D deficiency 08/29/2011   GERD (gastroesophageal reflux disease) 11/23/2010   Dyslipidemia 09/09/2007    Hypertension, essential, benign 09/09/2007    PCP: Karie Georges, MD  REFERRING PROVIDER: Karie Georges, MD  THERAPY DIAG:  Cervicalgia  Muscle weakness  REFERRING DIAG: Neck pain on right side [M54.2], Impingement syndrome of right shoulder [M75.41]   Rationale for Evaluation and Treatment:  Rehabilitation  SUBJECTIVE:  PERTINENT PAST HISTORY:  Osteoporosis, vertigo      PRECAUTIONS: None  WEIGHT BEARING RESTRICTIONS No  FALLS:  Has patient fallen in last 6 months? No, Number of falls: 0  MOI/History of condition:  Onset date: ~ 3 months  SUBJECTIVE STATEMENT Patient reports that her neck and shoulder pain continue. She reports some HEP compliance. She's also been having issues with leg cramps overnight recently, she started a muscle relaxer last night and says it was minimally helpful.     Red flags:  denies bil numbness and tingling  Pain:  Are you having pain? Yes Pain location: R sided neck pain and R>L shoulder pain  NPRS scale:  0/10 to 5/10 Aggravating factors: lifting objects, reaching OH, L rotation of Cx spine Relieving factors: rest Pain description: aching Stage: Chronic Stability: getting better 24 hour pattern: worse at night   Occupation: NA  Assistive Device: NA  Hand Dominance: R  Patient Goals/Specific Activities: reduce  pain, improve reaching    OBJECTIVE:   DIAGNOSTIC FINDINGS:  R shoulder x-rays clear, Cx x-ray shows degenerative changes and possible stenosis but is limited d/t quality  GENERAL OBSERVATION:  Rounded shoulder posture     SENSATION:  Light touch: Appears intact   PALPATION: TTP bil UT and scalenes   Cervical ROM  ROM ROM  (Eval)  Flexion 30  Extension 10*  Right lateral flexion 20*  Left lateral flexion 30*  Right rotation 30*  Left rotation 40  Flexion rotation (normal is 30 degrees)   Flexion rotation (normal is 30 degrees)     (Blank rows = not tested, N = WNL, * = concordant  pain)   UPPER EXTREMITY AROM:  ROM Right 11/09/2022 Left 11/09/2022  Shoulder flexion 110 neck pain 105 neck pain  Shoulder abduction    Shoulder internal rotation    Shoulder external rotation    Functional IR L3 neck pain L3 neck pain  Functional ER T3 neck pain T3 neck pain  Shoulder extension    Elbow extension    Elbow flexion     (Blank rows = not tested, N = WNL, * = concordant pain with testing)     UPPER EXTREMITY MMT:  MMT Right 11/09/2022 Left 11/09/2022  Shoulder flexion    Shoulder abduction (C5)    Shoulder ER N neck pain N neck pain  Shoulder IR N neck pain N neck pain  Middle trapezius    Lower trapezius    Shoulder extension    Grip strength    Shoulder shrug (C4)    Elbow flexion (C6)    Elbow ext (C7)    Thumb ext (C8)    Finger abd (T1)    Grossly     (Blank rows = not tested, score listed is out of 5 possible points.  N = WNL, D = diminished, C = clear for gross weakness with myotome testing, * = concordant pain with testing)   SPECIAL TESTS:  Spurling (+) for neck pain, not for radicular pain  JOINT MOBILITY TESTING:  Hypomobile throughout Cx spine  PATIENT SURVEYS:  FOTO: 47 -> 62    TODAY'S TREATMENT:  Creating, reviewing, and completing below HEP  [facility] Adult PT Treatment:                                                DATE: 11/09/22 Therapeutic Exercise: Nustep level 5 x 5 mins Rows BlueTB 2x10 Shoulder extension BlueTB 2x10 Seated BIL ER with scap retraction GTB 2x10 Seated scaption <90 2x10 SNAGs rotation x10 BIL Cervical extension over towel 5" hold x10 Upper trap stretch x30" BIL Manual Therapy: STM BIL upper traps, cervical paraspinals with patient positioned reclined on table due to dizziness with full supine   PATIENT EDUCATION:  POC, diagnosis, prognosis, HEP, and outcome measures.  Pt educated via explanation, demonstration, and handout (HEP).  Pt confirms understanding verbally.    HOME EXERCISE PROGRAM: Access  Code: [email or web address] URL: [email or web address] Date: 10/26/2022 Prepared by: Alphonzo Severance  Exercises - Standing Bilateral Low Shoulder Row with Anchored Resistance  - 2 x daily - 7 x weekly - 3 sets - 10 reps - Cervical Extension AROM with Strap  - 2 x daily - 7 x weekly - 2 sets - 10 reps  Treatment priorities   Eval  STM UT, LS, scalenes, cervical paraspinals        Gentle Cx mobs as tolerated                                   ASSESSMENT:  CLINICAL IMPRESSION: Patient presents to first follow up PT session reporting continued neck and shoulder pain, R>L and occasional HEP compliance. She states she has been having issues with muscle cramps in BIL LE overnight recently, she has obtained muscle relaxers for this and notes minimal decrease in pain thus far. Session today focused on RTC and periscapular strengthening as well as cervical ROM. Manual techniques utilized to decrease pain and tension in upper traps and cervical paraspinals. Patient was able to tolerate all prescribed exercises with no adverse effects. Patient continues to benefit from skilled PT services and should be progressed as able to improve functional independence.    OBJECTIVE IMPAIRMENTS: Pain, cervical ROM, shoulder strength  ACTIVITY LIMITATIONS: lifting, reaching, housework  PERSONAL FACTORS: See medical history and pertinent history   REHAB POTENTIAL: Good  CLINICAL DECISION MAKING: Stable/uncomplicated  EVALUATION COMPLEXITY: Low   GOALS:   SHORT TERM GOALS: Target date: 11/23/2022   Arcenia will be >75% HEP compliant to improve carryover between sessions and facilitate independent management of condition  Evaluation: ongoing Goal status: INITIAL   LONG TERM GOALS: Target date: 12/21/2022   Thania will improve FOTO score to 62 as a proxy for functional improvement  Evaluation/Baseline: 47 Goal status: INITIAL   2.  Raley will be able to reach repeatedly into kitchen  cabinet with light objects such as a plate, not limited by pain  Evaluation/Baseline: limited Goal status: INITIAL   3.  Kirat will self report >/= 50% decrease in pain from evaluation   Evaluation/Baseline: 5/10 max pain Goal status: INITIAL   4.  Damarys will report confidence in self management of condition at time of discharge with advanced HEP  Evaluation/Baseline: unable to self manage Goal status: INITIAL    PLAN: PT FREQUENCY: 1-2x/week  PT DURATION: 8 weeks  PLANNED INTERVENTIONS: Therapeutic exercises, Aquatic therapy, Therapeutic activity, Neuro Muscular re-education, Gait training, Patient/Family education, Joint mobilization, Dry Needling, Electrical stimulation, Spinal mobilization and/or manipulation, Moist heat, Taping, Vasopneumatic device, Ionotophoresis 4mg /ml Dexamethasone, and Manual therapy   Berta Minor PTA 11/09/2022, 12:08 PM

## 2022-11-13 ENCOUNTER — Ambulatory Visit: Payer: PPO | Attending: Family Medicine

## 2022-11-13 DIAGNOSIS — M542 Cervicalgia: Secondary | ICD-10-CM | POA: Diagnosis not present

## 2022-11-13 DIAGNOSIS — M6281 Muscle weakness (generalized): Secondary | ICD-10-CM | POA: Diagnosis not present

## 2022-11-13 NOTE — Therapy (Signed)
OUTPATIENT PHYSICAL THERAPY TREATMENT NOTE   Patient Name: Robin [name] MRN: [id number] DOB:1933/08/18, 89 y.o., female Today's Date: 11/13/2022   PT End of Session - 11/13/22 0952     Visit Number 3    Date for PT Re-Evaluation 12/21/22    Authorization Type HTA - FOTO    PT Start Time 0955    PT Stop Time 1035    PT Time Calculation (min) 40 min    Activity Tolerance Patient tolerated treatment well    Behavior During Therapy [facility] for tasks assessed/performed             Past Medical History:  Diagnosis Date   Abnormal Pap smear of cervix    Allergy    Arthritis    Asthma    Cataract 2018   bilateral eyes   Colon polyps    tubulovillous adenoma, 2010   External hemorrhoids without mention of complication    GERD (gastroesophageal reflux disease)    Headache    eval with neuro in 2011   Hyperlipemia    Hyperparathyroidism (HCC)    seeing Dr. Lucianne Muss in endocrinology   Hypertension    IBS (irritable bowel syndrome), GERD, hx colon polyps, chronic abd pain - followed by Dr. Jarold Motto in GI 06/11/2012   Osteoporosis 08/29/2011   Vertigo    eval with neuro in 2011, brief recurrence 2016   Past Surgical History:  Procedure Laterality Date   BACK SURGERY  1974   CARPAL TUNNEL RELEASE Left 06/2008   COLONOSCOPY     EYE SURGERY  07/2017   bilateral cataract extraction   POLYPECTOMY     TONSILLECTOMY     UPPER GASTROINTESTINAL ENDOSCOPY     Patient Active Problem List   Diagnosis Date Noted   Osteoporosis of femur without pathological fracture 06/24/2019   Dyspnea 02/03/2019   Asthma 02/03/2019   Vertigo 06/26/2017   Hyperparathyroidism (HCC) 08/24/2014   Slow transit constipation 01/13/2014   IBS (irritable bowel syndrome), GERD, hx colon polyps, chronic abd pain - followed by Dr. Jarold Motto in GI 06/11/2012   Osteopenia 08/29/2011   Vitamin D deficiency 08/29/2011   GERD (gastroesophageal reflux disease) 11/23/2010   Dyslipidemia 09/09/2007   Hypertension,  essential, benign 09/09/2007    PCP: Karie Georges, MD  REFERRING PROVIDER: Karie Georges, MD  THERAPY DIAG:  Cervicalgia  Muscle weakness  REFERRING DIAG: Neck pain on right side [M54.2], Impingement syndrome of right shoulder [M75.41]   Rationale for Evaluation and Treatment:  Rehabilitation  SUBJECTIVE:  PERTINENT PAST HISTORY:  Osteoporosis, vertigo      PRECAUTIONS: None  WEIGHT BEARING RESTRICTIONS No  FALLS:  Has patient fallen in last 6 months? No, Number of falls: 0  MOI/History of condition:  Onset date: ~ 3 months  SUBJECTIVE STATEMENT Patient reports that the Nustep hurt her Rt hip last session, requests not to do today. She reports her neck pain continues. She also states her BIL leg cramps continue overnight.     Red flags:  denies bil numbness and tingling  Pain:  Are you having pain? Yes Pain location: R sided neck pain and R>L shoulder pain  NPRS scale:  0/10 to 5/10 Aggravating factors: lifting objects, reaching OH, L rotation of Cx spine Relieving factors: rest Pain description: aching Stage: Chronic Stability: getting better 24 hour pattern: worse at night   Occupation: NA  Assistive Device: NA  Hand Dominance: R  Patient Goals/Specific Activities: reduce pain, improve reaching    OBJECTIVE:  DIAGNOSTIC FINDINGS:  R shoulder x-rays clear, Cx x-ray shows degenerative changes and possible stenosis but is limited d/t quality  GENERAL OBSERVATION:  Rounded shoulder posture     SENSATION:  Light touch: Appears intact   PALPATION: TTP bil UT and scalenes   Cervical ROM  ROM ROM  (Eval)  Flexion 30  Extension 10*  Right lateral flexion 20*  Left lateral flexion 30*  Right rotation 30*  Left rotation 40  Flexion rotation (normal is 30 degrees)   Flexion rotation (normal is 30 degrees)     (Blank rows = not tested, N = WNL, * = concordant pain)   UPPER EXTREMITY AROM:  ROM Right EVAL Left EVAL   Shoulder flexion 110 neck pain 105 neck pain  Shoulder abduction    Shoulder internal rotation    Shoulder external rotation    Functional IR L3 neck pain L3 neck pain  Functional ER T3 neck pain T3 neck pain  Shoulder extension    Elbow extension    Elbow flexion     (Blank rows = not tested, N = WNL, * = concordant pain with testing)     UPPER EXTREMITY MMT:  MMT Right EVAL Left EVAL  Shoulder flexion    Shoulder abduction (C5)    Shoulder ER N neck pain N neck pain  Shoulder IR N neck pain N neck pain  Middle trapezius    Lower trapezius    Shoulder extension    Grip strength    Shoulder shrug (C4)    Elbow flexion (C6)    Elbow ext (C7)    Thumb ext (C8)    Finger abd (T1)    Grossly     (Blank rows = not tested, score listed is out of 5 possible points.  N = WNL, D = diminished, C = clear for gross weakness with myotome testing, * = concordant pain with testing)   SPECIAL TESTS:  Spurling (+) for neck pain, not for radicular pain  JOINT MOBILITY TESTING:  Hypomobile throughout Cx spine  PATIENT SURVEYS:  FOTO: 47 -> 62    TODAY'S TREATMENT:  Creating, reviewing, and completing below HEP  [facility] Adult PT Treatment:                                                DATE: 11/13/22 Therapeutic Exercise: UBE level 2'/2' fwd/bwd Rows 10# 2x10 Shoulder extension 7# 2x10 500g ball circles on wall CW/CCW x10 each BIL (difficult) Seated BIL ER with scap retraction GTB 2x10 Reclined table (does not tolerate supine): Shoulder flexion/ext 500g ball 2x10 BIL (more pain in RUE) Shoulder abd/add 500g ball x10 BIL Chest press 500g ball 2x10 BIL Scaption 1# 2x10   [facility] Adult PT Treatment:                                                DATE: 11/09/22 Therapeutic Exercise: Nustep level 5 x 5 mins Rows BlueTB 2x10 Shoulder extension BlueTB 2x10 Seated BIL ER with scap retraction GTB 2x10 Seated scaption <90 2x10 SNAGs rotation x10 BIL Cervical extension over towel  5" hold x10 Upper trap stretch x30" BIL Manual Therapy: STM BIL upper traps, cervical paraspinals with patient positioned reclined on  table due to dizziness with full supine   PATIENT EDUCATION:  POC, diagnosis, prognosis, HEP, and outcome measures.  Pt educated via explanation, demonstration, and handout (HEP).  Pt confirms understanding verbally.    HOME EXERCISE PROGRAM: Access Code: [email or web address] URL: [email or web address] Date: 10/26/2022 Prepared by: Alphonzo Severance  Exercises - Standing Bilateral Low Shoulder Row with Anchored Resistance  - 2 x daily - 7 x weekly - 3 sets - 10 reps - Cervical Extension AROM with Strap  - 2 x daily - 7 x weekly - 2 sets - 10 reps  Treatment priorities   Eval        STM UT, LS, scalenes, cervical paraspinals        Gentle Cx mobs as tolerated                                   ASSESSMENT:  CLINICAL IMPRESSION: Patient presents to PT reporting continued neck and R shoulder pain. Session today focused on RTC and periscapular strengthening with increased resistance to good effect. Patient was able to tolerate all prescribed exercises with no adverse effects. Patient continues to benefit from skilled PT services and should be progressed as able to improve functional independence.    OBJECTIVE IMPAIRMENTS: Pain, cervical ROM, shoulder strength  ACTIVITY LIMITATIONS: lifting, reaching, housework  PERSONAL FACTORS: See medical history and pertinent history   REHAB POTENTIAL: Good  CLINICAL DECISION MAKING: Stable/uncomplicated  EVALUATION COMPLEXITY: Low   GOALS:   SHORT TERM GOALS: Target date: 11/23/2022   [place] will be >75% HEP compliant to improve carryover between sessions and facilitate independent management of condition  Evaluation: ongoing Goal status: INITIAL   LONG TERM GOALS: Target date: 12/21/2022   Ryleeann will improve FOTO score to 62 as a proxy for functional improvement  Evaluation/Baseline:  47 Goal status: INITIAL   2.  Titiana will be able to reach repeatedly into kitchen cabinet with light objects such as a plate, not limited by pain  Evaluation/Baseline: limited Goal status: INITIAL   3.  Rya will self report >/= 50% decrease in pain from evaluation   Evaluation/Baseline: 5/10 max pain Goal status: INITIAL   4.  Adalaide will report confidence in self management of condition at time of discharge with advanced HEP  Evaluation/Baseline: unable to self manage Goal status: INITIAL    PLAN: PT FREQUENCY: 1-2x/week  PT DURATION: 8 weeks  PLANNED INTERVENTIONS: Therapeutic exercises, Aquatic therapy, Therapeutic activity, Neuro Muscular re-education, Gait training, Patient/Family education, Joint mobilization, Dry Needling, Electrical stimulation, Spinal mobilization and/or manipulation, Moist heat, Taping, Vasopneumatic device, Ionotophoresis 4mg /ml Dexamethasone, and Manual therapy   Berta Minor PTA 11/13/2022, 10:35 AM

## 2022-11-15 ENCOUNTER — Ambulatory Visit: Payer: PPO

## 2022-11-15 DIAGNOSIS — M6281 Muscle weakness (generalized): Secondary | ICD-10-CM

## 2022-11-15 DIAGNOSIS — M542 Cervicalgia: Secondary | ICD-10-CM

## 2022-11-15 NOTE — Therapy (Signed)
OUTPATIENT PHYSICAL THERAPY TREATMENT NOTE   Patient Name: Robin [name] MRN: [id number] DOB:15-Jul-1933, 89 y.o., female Today's Date: 11/15/2022   PT End of Session - 11/15/22 1439     Visit Number 4    Date for PT Re-Evaluation 12/21/22    Authorization Type HTA - FOTO    PT Start Time 1400    PT Stop Time 1441    PT Time Calculation (min) 41 min    Activity Tolerance Patient tolerated treatment well    Behavior During Therapy [facility] for tasks assessed/performed              Past Medical History:  Diagnosis Date   Abnormal Pap smear of cervix    Allergy    Arthritis    Asthma    Cataract 2018   bilateral eyes   Colon polyps    tubulovillous adenoma, 2010   External hemorrhoids without mention of complication    GERD (gastroesophageal reflux disease)    Headache    eval with neuro in 2011   Hyperlipemia    Hyperparathyroidism (HCC)    seeing Dr. Lucianne Muss in endocrinology   Hypertension    IBS (irritable bowel syndrome), GERD, hx colon polyps, chronic abd pain - followed by Dr. Jarold Motto in GI 06/11/2012   Osteoporosis 08/29/2011   Vertigo    eval with neuro in 2011, brief recurrence 2016   Past Surgical History:  Procedure Laterality Date   BACK SURGERY  1974   CARPAL TUNNEL RELEASE Left 06/2008   COLONOSCOPY     EYE SURGERY  07/2017   bilateral cataract extraction   POLYPECTOMY     TONSILLECTOMY     UPPER GASTROINTESTINAL ENDOSCOPY     Patient Active Problem List   Diagnosis Date Noted   Osteoporosis of femur without pathological fracture 06/24/2019   Dyspnea 02/03/2019   Asthma 02/03/2019   Vertigo 06/26/2017   Hyperparathyroidism (HCC) 08/24/2014   Slow transit constipation 01/13/2014   IBS (irritable bowel syndrome), GERD, hx colon polyps, chronic abd pain - followed by Dr. Jarold Motto in GI 06/11/2012   Osteopenia 08/29/2011   Vitamin D deficiency 08/29/2011   GERD (gastroesophageal reflux disease) 11/23/2010   Dyslipidemia 09/09/2007    Hypertension, essential, benign 09/09/2007    PCP: Karie Georges, MD  REFERRING PROVIDER: [place], Almer G, MD  THERAPY DIAG:  Cervicalgia  Muscle weakness  REFERRING DIAG: Neck pain on right side [M54.2], Impingement syndrome of right shoulder [M75.41]   Rationale for Evaluation and Treatment:  Rehabilitation  SUBJECTIVE:  PERTINENT PAST HISTORY:  Osteoporosis, vertigo      PRECAUTIONS: None  WEIGHT BEARING RESTRICTIONS No  FALLS:  Has patient fallen in last 6 months? No, Number of falls: 0  MOI/History of condition:  Onset date: ~ 3 months  SUBJECTIVE STATEMENT Patient reports that she had some soreness following her last session.     Red flags:  denies bil numbness and tingling  Pain:  Are you having pain? Yes Pain location: R sided neck pain and R>L shoulder pain  NPRS scale:  0/10 to 5/10 Aggravating factors: lifting objects, reaching OH, L rotation of Cx spine Relieving factors: rest Pain description: aching Stage: Chronic Stability: getting better 24 hour pattern: worse at night   Occupation: NA  Assistive Device: NA  Hand Dominance: R  Patient Goals/Specific Activities: reduce pain, improved reaching    OBJECTIVE:   DIAGNOSTIC FINDINGS:  R shoulder x-rays clear, Cx x-ray shows degenerative changes and possible stenosis but is  limited d/t quality  GENERAL OBSERVATION:  Rounded shoulder posture     SENSATION:  Light touch: Appears intact   PALPATION: TTP bil UT and scalenes   Cervical ROM  ROM ROM  (Eval)  Flexion 30  Extension 10*  Right lateral flexion 20*  Left lateral flexion 30*  Right rotation 30*  Left rotation 40  Flexion rotation (normal is 30 degrees)   Flexion rotation (normal is 30 degrees)     (Blank rows = not tested, N = WNL, * = concordant pain)   UPPER EXTREMITY AROM:  ROM Right EVAL Left EVAL  Shoulder flexion 110 neck pain 105 neck pain  Shoulder abduction    Shoulder internal rotation     Shoulder external rotation    Functional IR L3 neck pain L3 neck pain  Functional ER T3 neck pain T3 neck pain  Shoulder extension    Elbow extension    Elbow flexion     (Blank rows = not tested, N = WNL, * = concordant pain with testing)     UPPER EXTREMITY MMT:  MMT Right EVAL Left EVAL  Shoulder flexion    Shoulder abduction (C5)    Shoulder ER N neck pain N neck pain  Shoulder IR N neck pain N neck pain  Middle trapezius    Lower trapezius    Shoulder extension    Grip strength    Shoulder shrug (C4)    Elbow flexion (C6)    Elbow ext (C7)    Thumb ext (C8)    Finger abd (T1)    Grossly     (Blank rows = not tested, score listed is out of 5 possible points.  N = WNL, D = diminished, C = clear for gross weakness with myotome testing, * = concordant pain with testing)   SPECIAL TESTS:  Spurling (+) for neck pain, not for radicular pain  JOINT MOBILITY TESTING:  Hypomobile throughout Cx spine  PATIENT SURVEYS:  FOTO: 47 -> 62    TODAY'S TREATMENT:  Creating, reviewing, and completing below HEP   [facility] Adult PT Treatment:                                                DATE: 11/15/2022  Therapeutic Exercise: UBE level 2, 3'/3' fwd/bwd Rows, green TB x 15  Shoulder extension, green TB x15 1000g ball circles on wall CW/CCW x10 each BIL (attempted, unable to perform with correct form) Standing BIL ER with scap retraction GTB 2x10   Seated  Shoulder flexion/ext 1000g ball 2x10 BIL (more pain in RUE) "Ball passes" shoulder abd/add 1000g ball x15 BIL  Chest press dowel + 1.5# weight 2x10 BIL Scaption 2#, 2x15   [facility] Adult PT Treatment:                                                DATE: 11/13/22 Therapeutic Exercise: UBE level 2'/2' fwd/bwd Rows 10# 2x10 Shoulder extension 7# 2x10 500g ball circles on wall CW/CCW x10 each BIL (difficult) Seated BIL ER with scap retraction GTB 2x10 Reclined table (does not tolerate supine): Shoulder flexion/ext 500g ball  2x10 BIL (more pain in RUE) Shoulder abd/add 500g ball x10 BIL Chest press 500g ball  2x10 BIL Scaption 1# 2x10   [facility] Adult PT Treatment:                                                DATE: 11/09/22  Therapeutic Exercise: Nustep level 5 x 5 mins Rows BlueTB 2x10 Shoulder extension BlueTB 2x10 Seated BIL ER with scap retraction GTB 2x10 Seated scaption <90 2x10 SNAGs rotation x10 BIL Cervical extension over towel 5" hold x10 Upper trap stretch x30" BIL  Manual Therapy: STM BIL upper traps, cervical paraspinals with patient positioned reclined on table due to dizziness with full supine   PATIENT EDUCATION:  POC, diagnosis, prognosis, HEP, and outcome measures.  Pt educated via explanation, demonstration, and handout (HEP).  Pt confirms understanding verbally.    HOME EXERCISE PROGRAM: Access Code: [email or web address] URL: [email or web address] Date: 10/26/2022 Prepared by: Alphonzo Severance  Exercises - Standing Bilateral Low Shoulder Row with Anchored Resistance  - 2 x daily - 7 x weekly - 3 sets - 10 reps - Cervical Extension AROM with Strap  - 2 x daily - 7 x weekly - 2 sets - 10 reps  Treatment priorities   Eval        STM UT, LS, scalenes, cervical paraspinals        Gentle Cx mobs as tolerated                                   ASSESSMENT:  CLINICAL IMPRESSION: Patient continues to progress well with strengthening program, including good tolerance of increased resistance with seated exercises. She did have difficulty with correct performance of shoulder circles with ball on the wall. We will continue to progress towards functional independence and established goals.     OBJECTIVE IMPAIRMENTS: Pain, cervical ROM, shoulder strength  ACTIVITY LIMITATIONS: lifting, reaching, housework  PERSONAL FACTORS: See medical history and pertinent history   REHAB POTENTIAL: Good  CLINICAL DECISION MAKING: Stable/uncomplicated  EVALUATION COMPLEXITY:  Low   GOALS:   SHORT TERM GOALS: Target date: 11/23/2022   Fredda will be >75% HEP compliant to improve carryover between sessions and facilitate independent management of condition  Evaluation: ongoing Goal status: INITIAL   LONG TERM GOALS: Target date: 12/21/2022   Janna will improve FOTO score to 62 as a proxy for functional improvement  Evaluation/Baseline: 47 Goal status: INITIAL   2.  Seleta will be able to reach repeatedly into kitchen cabinet with light objects such as a plate, not limited by pain  Evaluation/Baseline: limited Goal status: INITIAL   3.  Charvi will self report >/= 50% decrease in pain from evaluation   Evaluation/Baseline: 5/10 max pain Goal status: INITIAL   4.  Dianette will report confidence in self management of condition at time of discharge with advanced HEP  Evaluation/Baseline: unable to self manage Goal status: INITIAL    PLAN: PT FREQUENCY: 1-2x/week  PT DURATION: 8 weeks  PLANNED INTERVENTIONS: Therapeutic exercises, Aquatic therapy, Therapeutic activity, Neuro Muscular re-education, Gait training, Patient/Family education, Joint mobilization, Dry Needling, Electrical stimulation, Spinal mobilization and/or manipulation, Moist heat, Taping, Vasopneumatic device, Ionotophoresis 4mg /ml Dexamethasone, and Manual therapy   Mauri Reading PT, DPT 11/15/2022, 2:43 PM

## 2022-11-21 ENCOUNTER — Ambulatory Visit: Payer: PPO

## 2022-11-21 DIAGNOSIS — M542 Cervicalgia: Secondary | ICD-10-CM

## 2022-11-21 DIAGNOSIS — M6281 Muscle weakness (generalized): Secondary | ICD-10-CM

## 2022-11-21 NOTE — Therapy (Signed)
OUTPATIENT PHYSICAL THERAPY TREATMENT NOTE   Patient Name: Robin [name] MRN: [id number] DOB:October 14, 1933, 89 y.o., female Today's Date: 11/21/2022   PT End of Session - 11/21/22 1032     Visit Number 5    Date for PT Re-Evaluation 12/21/22    Authorization Type HTA - FOTO    PT Start Time 1002               Past Medical History:  Diagnosis Date   Abnormal Pap smear of cervix    Allergy    Arthritis    Asthma    Cataract 2018   bilateral eyes   Colon polyps    tubulovillous adenoma, 2010   External hemorrhoids without mention of complication    GERD (gastroesophageal reflux disease)    Headache    eval with neuro in 2011   Hyperlipemia    Hyperparathyroidism (HCC)    seeing Dr. Lucianne Muss in endocrinology   Hypertension    IBS (irritable bowel syndrome), GERD, hx colon polyps, chronic abd pain - followed by Dr. Jarold Motto in GI 06/11/2012   Osteoporosis 08/29/2011   Vertigo    eval with neuro in 2011, brief recurrence 2016   Past Surgical History:  Procedure Laterality Date   BACK SURGERY  1974   CARPAL TUNNEL RELEASE Left 06/2008   COLONOSCOPY     EYE SURGERY  07/2017   bilateral cataract extraction   POLYPECTOMY     TONSILLECTOMY     UPPER GASTROINTESTINAL ENDOSCOPY     Patient Active Problem List   Diagnosis Date Noted   Osteoporosis of femur without pathological fracture 06/24/2019   Dyspnea 02/03/2019   Asthma 02/03/2019   Vertigo 06/26/2017   Hyperparathyroidism (HCC) 08/24/2014   Slow transit constipation 01/13/2014   IBS (irritable bowel syndrome), GERD, hx colon polyps, chronic abd pain - followed by Dr. Jarold Motto in GI 06/11/2012   Osteopenia 08/29/2011   Vitamin D deficiency 08/29/2011   GERD (gastroesophageal reflux disease) 11/23/2010   Dyslipidemia 09/09/2007   Hypertension, essential, benign 09/09/2007    PCP: Karie Georges, MD  REFERRING PROVIDER: Karie Georges, MD  THERAPY DIAG:  No diagnosis found.  REFERRING DIAG: Neck  pain on right side [M54.2], Impingement syndrome of right shoulder [M75.41]   Rationale for Evaluation and Treatment:  Rehabilitation  SUBJECTIVE:  PERTINENT PAST HISTORY:  Osteoporosis, vertigo      PRECAUTIONS: None  WEIGHT BEARING RESTRICTIONS No  FALLS:  Has patient fallen in last 6 months? No, Number of falls: 0  MOI/History of condition:  Onset date: ~ 3 months  SUBJECTIVE STATEMENT Patient reports that her neck and shoulder are feeling okay. I do have some dizziness/lightheadness and nausea this morning, which she reports is normal for her d/t getting older. She did eat this morning with her medications.    Red flags:  denies bil numbness and tingling  Pain:  Are you having pain? Yes Pain location: R sided neck pain and R>L shoulder pain  NPRS scale:  0/10 to 5/10 Aggravating factors: lifting objects, reaching OH, L rotation of Cx spine Relieving factors: rest Pain description: aching Stage: Chronic Stability: getting better 24 hour pattern: worse at night   Occupation: NA  Assistive Device: NA  Hand Dominance: R  Patient Goals/Specific Activities: reduce pain, improved reaching    OBJECTIVE:   DIAGNOSTIC FINDINGS:  R shoulder x-rays clear, Cx x-ray shows degenerative changes and possible stenosis but is limited d/t quality  GENERAL OBSERVATION:  Rounded shoulder posture  SENSATION:  Light touch: Appears intact   PALPATION: TTP bil UT and scalenes   Cervical ROM  ROM ROM  (Eval)  Flexion 30  Extension 10*  Right lateral flexion 20*  Left lateral flexion 30*  Right rotation 30*  Left rotation 40  Flexion rotation (normal is 30 degrees)   Flexion rotation (normal is 30 degrees)     (Blank rows = not tested, N = WNL, * = concordant pain)   UPPER EXTREMITY AROM:  ROM Right EVAL Left EVAL  Shoulder flexion 110 neck pain 105 neck pain  Shoulder abduction    Shoulder internal rotation    Shoulder external rotation    Functional  IR L3 neck pain L3 neck pain  Functional ER T3 neck pain T3 neck pain  Shoulder extension    Elbow extension    Elbow flexion     (Blank rows = not tested, N = WNL, * = concordant pain with testing)     UPPER EXTREMITY MMT:  MMT Right EVAL Left EVAL  Shoulder flexion    Shoulder abduction (C5)    Shoulder ER N neck pain N neck pain  Shoulder IR N neck pain N neck pain  Middle trapezius    Lower trapezius    Shoulder extension    Grip strength    Shoulder shrug (C4)    Elbow flexion (C6)    Elbow ext (C7)    Thumb ext (C8)    Finger abd (T1)    Grossly     (Blank rows = not tested, score listed is out of 5 possible points.  N = WNL, D = diminished, C = clear for gross weakness with myotome testing, * = concordant pain with testing)   SPECIAL TESTS:  Spurling (+) for neck pain, not for radicular pain  JOINT MOBILITY TESTING:  Hypomobile throughout Cx spine  PATIENT SURVEYS:  FOTO: 47 -> 62    TODAY'S TREATMENT:  Creating, reviewing, and completing below HEP   [facility] Adult PT Treatment:                                                DATE: 11/21/2022  Therapeutic Exercise: UBE level 2, 2.5 each way, fwd/bwd Rows, green TB x 15  Shoulder extension, green TB, 2 x15   Seated  BIL ER with scap retraction GTB 3x10 Horizontal abduction GTB x 10  Shoulder flexion/ext with 2# weight 2x10 BIL  Scaption 2#, 2x15 "Ball passes" shoulder abd/add 1000g ball 2 x 10 BIL  Chest press dowel + 1.5# weight 2x10 BIL Foam roller wall slides with 3 sec hold at end range x 6  Counter to overhead lifting at cabinet, 2# dumbbell x 3 minutes    [facility] Adult PT Treatment:                                                DATE: 11/15/2022  Therapeutic Exercise: UBE level 2, 3'/3' fwd/bwd Rows, green TB x 15  Shoulder extension, green TB x15 1000g ball circles on wall CW/CCW x10 each BIL (attempted, unable to perform with correct form) Standing BIL ER with scap retraction GTB 2x10    Seated  Shoulder flexion/ext 1000g ball 2x10 BIL (  more pain in RUE) "Ball passes" shoulder abd/add 1000g ball x15 BIL  Chest press dowel + 1.5# weight 2x10 BIL Scaption 2#, 2x15   [facility] Adult PT Treatment:                                                DATE: 11/13/22 Therapeutic Exercise: UBE level 2'/2' fwd/bwd Rows 10# 2x10 Shoulder extension 7# 2x10 500g ball circles on wall CW/CCW x10 each BIL (difficult) Seated BIL ER with scap retraction GTB 2x10 Reclined table (does not tolerate supine): Shoulder flexion/ext 500g ball 2x10 BIL (more pain in RUE) Shoulder abd/add 500g ball x10 BIL Chest press 500g ball 2x10 BIL Scaption 1# 2x10   [facility] Adult PT Treatment:                                                DATE: 11/09/22  Therapeutic Exercise: Nustep level 5 x 5 mins Rows BlueTB 2x10 Shoulder extension BlueTB 2x10 Seated BIL ER with scap retraction GTB 2x10 Seated scaption <90 2x10 SNAGs rotation x10 BIL Cervical extension over towel 5" hold x10 Upper trap stretch x30" BIL  Manual Therapy: STM BIL upper traps, cervical paraspinals with patient positioned reclined on table due to dizziness with full supine   PATIENT EDUCATION:  POC, diagnosis, prognosis, HEP, and outcome measures.  Pt educated via explanation, demonstration, and handout (HEP).  Pt confirms understanding verbally.    HOME EXERCISE PROGRAM: Access Code: [email or web address] URL: [email or web address] Date: 10/26/2022 Prepared by: Alphonzo Severance  Exercises - Standing Bilateral Low Shoulder Row with Anchored Resistance  - 2 x daily - 7 x weekly - 3 sets - 10 reps - Cervical Extension AROM with Strap  - 2 x daily - 7 x weekly - 2 sets - 10 reps  Treatment priorities   Eval        STM UT, LS, scalenes, cervical paraspinals        Gentle Cx mobs as tolerated                                   ASSESSMENT:  CLINICAL IMPRESSION:  Paulena did well with today session and was able to tolerate  increased repetitions. She did have some dizziness and nausea at start of session that did not worsen with today's session. She is demonstrating improved CS AROM and has met some of her goals. Plan is to discharge at next visit per patient request and good progression towards established goals. We will continue to progress towards functional independence and established goals.    OBJECTIVE IMPAIRMENTS: Pain, cervical ROM, shoulder strength  ACTIVITY LIMITATIONS: lifting, reaching, housework  PERSONAL FACTORS: See medical history and pertinent history   REHAB POTENTIAL: Good  CLINICAL DECISION MAKING: Stable/uncomplicated  EVALUATION COMPLEXITY: Low   GOALS:   SHORT TERM GOALS: Target date: 11/23/2022   Allisyn will be >75% HEP compliant to improve carryover between sessions and facilitate independent management of condition  Evaluation: ongoing Goal status: MET 11/21/22   LONG TERM GOALS: Target date: 12/21/2022   Alvilda will improve FOTO score to 62 as a proxy for functional improvement  Evaluation/Baseline: 47 Goal status: INITIAL  2.  Eddy will be able to reach repeatedly into kitchen cabinet with light objects such as a plate, not limited by pain  Evaluation/Baseline: limited Goal status: MET as of 11/21/22   3.  Maiah will self report >/= 50% decrease in pain from evaluation   Evaluation/Baseline: 5/10 max pain 11/21/22: 3/10 at worst over past week  Goal status: INITIAL   4.  Alailah will report confidence in self management of condition at time of discharge with advanced HEP  Evaluation/Baseline: unable to self manage Goal status: INITIAL    PLAN: PT FREQUENCY: 1-2x/week  PT DURATION: 8 weeks  PLANNED INTERVENTIONS: Therapeutic exercises, Aquatic therapy, Therapeutic activity, Neuro Muscular re-education, Gait training, Patient/Family education, Joint mobilization, Dry Needling, Electrical stimulation, Spinal mobilization and/or manipulation, Moist heat,  Taping, Vasopneumatic device, Ionotophoresis 4mg /ml Dexamethasone, and Manual therapy   Mauri Reading PT, DPT 11/21/2022, 10:44 AM

## 2022-11-22 ENCOUNTER — Encounter: Payer: Self-pay | Admitting: Neurology

## 2022-11-23 ENCOUNTER — Ambulatory Visit: Payer: PPO

## 2022-11-23 DIAGNOSIS — M542 Cervicalgia: Secondary | ICD-10-CM

## 2022-11-23 DIAGNOSIS — M6281 Muscle weakness (generalized): Secondary | ICD-10-CM

## 2022-11-23 NOTE — Therapy (Addendum)
PHYSICAL THERAPY DISCHARGE SUMMARY  Visits from Start of Care: 6  Current functional level related to goals / functional outcomes: See assessment/goals   Remaining deficits: See assessment/goals   Education / Equipment: HEP and D/C plans  Patient agrees to discharge. Patient goals were met. Patient is being discharged due to meeting the stated rehab goals.  OUTPATIENT PHYSICAL THERAPY TREATMENT NOTE   Patient Name: Robin [name] MRN: [id number] DOB:24-Apr-1934, 89 y.o., female Today's Date: 11/23/2022   PT End of Session - 11/23/22 1001     Visit Number 6    Date for PT Re-Evaluation 12/21/22    Authorization Type HTA - FOTO    PT Start Time 1000    PT Stop Time 1025    PT Time Calculation (min) 25 min    Activity Tolerance Patient tolerated treatment well    Behavior During Therapy [facility] for tasks assessed/performed             Past Medical History:  Diagnosis Date   Abnormal Pap smear of cervix    Allergy    Arthritis    Asthma    Cataract 2018   bilateral eyes   Colon polyps    tubulovillous adenoma, 2010   External hemorrhoids without mention of complication    GERD (gastroesophageal reflux disease)    Headache    eval with neuro in 2011   Hyperlipemia    Hyperparathyroidism (HCC)    seeing Dr. Lucianne Muss in endocrinology   Hypertension    IBS (irritable bowel syndrome), GERD, hx colon polyps, chronic abd pain - followed by Dr. Jarold Motto in GI 06/11/2012   Osteoporosis 08/29/2011   Vertigo    eval with neuro in 2011, brief recurrence 2016   Past Surgical History:  Procedure Laterality Date   BACK SURGERY  1974   CARPAL TUNNEL RELEASE Left 06/2008   COLONOSCOPY     EYE SURGERY  07/2017   bilateral cataract extraction   POLYPECTOMY     TONSILLECTOMY     UPPER GASTROINTESTINAL ENDOSCOPY     Patient Active Problem List   Diagnosis Date Noted   Osteoporosis of femur without pathological fracture 06/24/2019   Dyspnea 02/03/2019   Asthma 02/03/2019    Vertigo 06/26/2017   Hyperparathyroidism (HCC) 08/24/2014   Slow transit constipation 01/13/2014   IBS (irritable bowel syndrome), GERD, hx colon polyps, chronic abd pain - followed by Dr. Jarold Motto in GI 06/11/2012   Osteopenia 08/29/2011   Vitamin D deficiency 08/29/2011   GERD (gastroesophageal reflux disease) 11/23/2010   Dyslipidemia 09/09/2007   Hypertension, essential, benign 09/09/2007    PCP: Karie Georges, MD  REFERRING PROVIDER: Karie Georges, MD  THERAPY DIAG:  Cervicalgia  Muscle weakness  REFERRING DIAG: Neck pain on right side [M54.2], Impingement syndrome of right shoulder [M75.41]   Rationale for Evaluation and Treatment:  Rehabilitation  SUBJECTIVE:  PERTINENT PAST HISTORY:  Osteoporosis, vertigo      PRECAUTIONS: None  WEIGHT BEARING RESTRICTIONS No  FALLS:  Has patient fallen in last 6 months? No, Number of falls: 0  MOI/History of condition:  Onset date: ~ 3 months  SUBJECTIVE STATEMENT Patient reports no pain this morning, does have some dizziness and nausea that she states is an "everyday thing."    Red flags:  denies bil numbness and tingling  Pain:  Are you having pain? Yes Pain location: R sided neck pain and R>L shoulder pain  NPRS scale:  0/10 to 5/10 Aggravating factors: lifting objects, reaching OH,  L rotation of Cx spine Relieving factors: rest Pain description: aching Stage: Chronic Stability: getting better 24 hour pattern: worse at night   Occupation: NA  Assistive Device: NA  Hand Dominance: R  Patient Goals/Specific Activities: reduce pain, improved reaching    OBJECTIVE:   DIAGNOSTIC FINDINGS:  R shoulder x-rays clear, Cx x-ray shows degenerative changes and possible stenosis but is limited d/t quality  GENERAL OBSERVATION:  Rounded shoulder posture     SENSATION:  Light touch: Appears intact   PALPATION: TTP bil UT and scalenes   Cervical ROM  ROM ROM  (Eval)  Flexion 30  Extension  10*  Right lateral flexion 20*  Left lateral flexion 30*  Right rotation 30*  Left rotation 40  Flexion rotation (normal is 30 degrees)   Flexion rotation (normal is 30 degrees)     (Blank rows = not tested, N = WNL, * = concordant pain)   UPPER EXTREMITY AROM:  ROM Right EVAL Left EVAL  Shoulder flexion 110 neck pain 105 neck pain  Shoulder abduction    Shoulder internal rotation    Shoulder external rotation    Functional IR L3 neck pain L3 neck pain  Functional ER T3 neck pain T3 neck pain  Shoulder extension    Elbow extension    Elbow flexion     (Blank rows = not tested, N = WNL, * = concordant pain with testing)     UPPER EXTREMITY MMT:  MMT Right EVAL Left EVAL  Shoulder flexion    Shoulder abduction (C5)    Shoulder ER N neck pain N neck pain  Shoulder IR N neck pain N neck pain  Middle trapezius    Lower trapezius    Shoulder extension    Grip strength    Shoulder shrug (C4)    Elbow flexion (C6)    Elbow ext (C7)    Thumb ext (C8)    Finger abd (T1)    Grossly     (Blank rows = not tested, score listed is out of 5 possible points.  N = WNL, D = diminished, C = clear for gross weakness with myotome testing, * = concordant pain with testing)   SPECIAL TESTS:  Spurling (+) for neck pain, not for radicular pain  JOINT MOBILITY TESTING:  Hypomobile throughout Cx spine  PATIENT SURVEYS:  FOTO: 47 -> 62 11/23/22: 62%    TODAY'S TREATMENT:  Creating, reviewing, and completing below HEP  [facility] Adult PT Treatment:                                                DATE: 11/23/22 Therapeutic Exercise: UBE level 2 3'/3' fwd/bwd while gathering subjective info Rows GTB 2x10 Shoulder extension GTB 2x10 Seated BIL ER with scap retraction GTB 2x10 Horizontal abduction GTB 2x10 Therapeutic Activity: Review of tests, measures, goals Update and review of HEP   [facility] Adult PT Treatment:                                                DATE:  11/21/2022  Therapeutic Exercise: UBE level 2, 2.5 each way, fwd/bwd Rows, green TB x 15  Shoulder extension, green TB, 2 x15   Seated  BIL ER with scap retraction GTB 3x10 Horizontal abduction GTB x 10  Shoulder flexion/ext with 2# weight 2x10 BIL  Scaption 2#, 2x15 "Ball passes" shoulder abd/add 1000g ball 2 x 10 BIL  Chest press dowel + 1.5# weight 2x10 BIL Foam roller wall slides with 3 sec hold at end range x 6  Counter to overhead lifting at cabinet, 2# dumbbell x 3 minutes    [facility] Adult PT Treatment:                                                DATE: 11/15/2022  Therapeutic Exercise: UBE level 2, 3'/3' fwd/bwd Rows, green TB x 15  Shoulder extension, green TB x15 1000g ball circles on wall CW/CCW x10 each BIL (attempted, unable to perform with correct form) Standing BIL ER with scap retraction GTB 2x10   Seated  Shoulder flexion/ext 1000g ball 2x10 BIL (more pain in RUE) "Ball passes" shoulder abd/add 1000g ball x15 BIL  Chest press dowel + 1.5# weight 2x10 BIL Scaption 2#, 2x15    PATIENT EDUCATION:  POC, diagnosis, prognosis, HEP, and outcome measures.  Pt educated via explanation, demonstration, and handout (HEP).  Pt confirms understanding verbally.    HOME EXERCISE PROGRAM: Access Code: [email or web address] URL: [email or web address] Date: 11/23/2022 Prepared by: Berta Minor  Exercises - Standing Bilateral Low Shoulder Row with Anchored Resistance  - 2 x daily - 7 x weekly - 3 sets - 10 reps - Cervical Extension AROM with Strap  - 2 x daily - 7 x weekly - 2 sets - 10 reps - Shoulder extension with resistance - Neutral  - 2 x daily - 7 x weekly - 3 sets - 10 reps - Shoulder External Rotation and Scapular Retraction with Resistance  - 2 x daily - 7 x weekly - 3 sets - 10 reps - Standing Shoulder Horizontal Abduction with Resistance  - 2 x daily - 7 x weekly - 3 sets - 10 reps  Treatment priorities   Eval        STM UT, LS, scalenes, cervical  paraspinals        Gentle Cx mobs as tolerated                                   ASSESSMENT:  CLINICAL IMPRESSION: Patient presents to PT reporting improvements in her neck and shoulder pain and requests to DC as she feels much improved since beginning PT. She has partially met her LTG regarding pain reduction, she still reports some pain with OH movements.She has met her FOTO goal. Updated and reviewed HEP with patient demonstrating understanding of each exercise. Patient is appropriate for DC from PT at this time.   OBJECTIVE IMPAIRMENTS: Pain, cervical ROM, shoulder strength  ACTIVITY LIMITATIONS: lifting, reaching, housework  PERSONAL FACTORS: See medical history and pertinent history   REHAB POTENTIAL: Good  CLINICAL DECISION MAKING: Stable/uncomplicated  EVALUATION COMPLEXITY: Low   GOALS:   SHORT TERM GOALS: Target date: 11/23/2022   Katanya will be >75% HEP compliant to improve carryover between sessions and facilitate independent management of condition  Evaluation: ongoing Goal status: MET 11/21/22   LONG TERM GOALS: Target date: 12/21/2022   Nyhla will improve FOTO score to 62 as a proxy for functional improvement  Evaluation/Baseline: 47 Goal status:  MET 11/23/22: 62%   2.  Lakeyia will be able to reach repeatedly into kitchen cabinet with light objects such as a plate, not limited by pain  Evaluation/Baseline: limited Goal status: MET as of 11/21/22   3.  Roxanne will self report >/= 50% decrease in pain from evaluation   Evaluation/Baseline: 5/10 max pain 11/21/22: 3/10 at worst over past week  Goal status: Partially MET 11/23/22: 3/10  at worst   4.  Rosaisela will report confidence in self management of condition at time of discharge with advanced HEP  Evaluation/Baseline: unable to self manage Goal status: MET    PLAN: PT FREQUENCY: 1-2x/week  PT DURATION: 8 weeks  PLANNED INTERVENTIONS: Therapeutic exercises, Aquatic therapy, Therapeutic  activity, Neuro Muscular re-education, Gait training, Patient/Family education, Joint mobilization, Dry Needling, Electrical stimulation, Spinal mobilization and/or manipulation, Moist heat, Taping, Vasopneumatic device, Ionotophoresis 4mg /ml Dexamethasone, and Manual therapy   Berta Minor PTA 11/23/2022, 10:27 AM

## 2022-11-27 ENCOUNTER — Other Ambulatory Visit: Payer: Self-pay

## 2022-11-27 DIAGNOSIS — R202 Paresthesia of skin: Secondary | ICD-10-CM

## 2022-11-28 ENCOUNTER — Ambulatory Visit: Payer: PPO | Admitting: Neurology

## 2022-11-28 ENCOUNTER — Encounter: Payer: Self-pay | Admitting: Family Medicine

## 2022-11-28 ENCOUNTER — Ambulatory Visit: Payer: PPO | Admitting: Physical Therapy

## 2022-11-28 DIAGNOSIS — R202 Paresthesia of skin: Secondary | ICD-10-CM

## 2022-11-28 DIAGNOSIS — M5417 Radiculopathy, lumbosacral region: Secondary | ICD-10-CM

## 2022-11-28 NOTE — Procedures (Signed)
[facility] Neurology  [address] [place], [address]  [place], [place] [address] Tel: [phone] Fax: [phone] Test Date:  11/28/2022  Patient: Robin [name] DOB: 1933-07-23 Physician: Jacquelyne Balint, MD  Sex: Female Height: 4\' 11"  Ref Phys: Nira Conn, MD  ID#: [id number]   Technician:    History: This is an 89 year old female with pain in bilateral lower limbs and cramps.  NCV & EMG Findings: Extensive electrodiagnostic evaluation of bilateral lower limbs shows: Bilateral sural and superficial peroneal/fibular sensory responses are absent. Bilateral peroneal/fibular (EDB) and tibial (AH) motor responses are within normal limits. Bilateral H reflex latencies are within normal limits. Chronic motor axon loss changes without accompanying active denervation changes are seen in the left tibialis anterior, left flexor digitorum longus, and left gluteus medius muscles. All other tested muscles are within normal limits with normal motor unit configuration and normal recruitment patterns. Lumbosacral paraspinal muscles were deferred due to prior lumbosacral spine surgery.  Impression: This is an abnormal study. The findings are most consistent with the following: The residuals of an old intraspinal canal lesion (ie: motor radiculopathy) at the left L5 root or segment, mild in degree electrically. No definitive electrodiagnostic evidence of a right lumbosacral (L3-S1) motor radiculopathy. While sensory responses are absent bilaterally, this may be normal for age versus evidence of a large fiber sensorimotor neuropathy. Given the needle examination findings, I favor absent sensory responses are due to normal age related changes.    ___________________________ Jacquelyne Balint, MD    Nerve Conduction Studies Motor Nerve Results    Latency Amplitude F-Lat Segment Distance CV Comment  Site (ms) Norm (mV) Norm (ms)  (cm) (m/s) Norm   Left Fibular (EDB) Motor  Ankle 3.5  < 6.0 3.6  > 2.5         Bel fib head 9.5 - 3.4 -  Bel fib head-Ankle 27 45  > 40   Pop fossa 11.5 - 3.4 -  Pop fossa-Bel fib head 9 45 -   Right Fibular (EDB) Motor  Ankle 3.2  < 6.0 2.5  > 2.5        Bel fib head 10.2 - 2.00 -  Bel fib head-Ankle 31 44  > 40   Pop fossa 11.8 - 1.89 -  Pop fossa-Bel fib head 8 50 -   Left Tibial (AH) Motor  Ankle 3.9  < 6.0 8.1  > 4.0        Knee 12.0 - 8.0 -  Knee-Ankle 35 43  > 40   Right Tibial (AH) Motor  Ankle 4.0  < 6.0 8.7  > 4.0        Knee 11.5 - 4.5 -  Knee-Ankle 38 51  > 40    Sensory Sites    Neg Peak Lat Amplitude (O-P) Segment Distance Velocity Comment  Site (ms) Norm (V) Norm  (cm) (ms)   Left Superficial Fibular Sensory  14 cm-Ankle *NR  < 4.6 *NR  > 3 14 cm-Ankle 14    Right Superficial Fibular Sensory  14 cm-Ankle *NR  < 4.6 *NR  > 3 14 cm-Ankle 14    Left Sural Sensory  Calf-Lat mall *NR  < 4.6 *NR  > 3 Calf-Lat mall 14    Right Sural Sensory  Calf-Lat mall *NR  < 4.6 *NR  > 3 Calf-Lat mall 14     H-Reflex Results    M-Lat H Lat H Neg Amp H-M Lat  Site (ms) (ms) Norm (mV) (ms)  Left Tibial H-Reflex  Pop fossa 6.0 30.5  < 35.0 1.14 24.5  Right Tibial H-Reflex  Pop fossa 6.1 31.6  < 35.0 3.8 25.5   Electromyography   Side Muscle Ins.Act Fibs Fasc Recrt Amp Dur Poly Activation Comment  Left Tib ant Nml Nml Nml *1- *1+ *1+ *1+ Nml N/A  Left Gastroc MH Nml Nml Nml Nml Nml Nml Nml Nml N/A  Left FDL Nml Nml Nml *1- *1+ *1+ *1+ Nml N/A  Left Rectus fem Nml Nml Nml Nml Nml Nml Nml Nml N/A  Left Gluteus med Nml Nml Nml *1- *1+ *1+ *1+ Nml N/A  Right Tib ant Nml Nml Nml Nml Nml Nml Nml Nml N/A  Right Gastroc MH Nml Nml Nml Nml Nml Nml Nml Nml N/A  Right FDL Nml Nml Nml Nml Nml Nml Nml Nml N/A  Right Rectus fem Nml Nml Nml Nml Nml Nml Nml Nml N/A  Right Biceps fem SH Nml Nml Nml Nml Nml Nml Nml Nml N/A      Waveforms:  Motor           Sensory           H-Reflex

## 2022-11-30 ENCOUNTER — Ambulatory Visit (INDEPENDENT_AMBULATORY_CARE_PROVIDER_SITE_OTHER): Payer: PPO | Admitting: Family Medicine

## 2022-11-30 ENCOUNTER — Encounter: Payer: Self-pay | Admitting: Family Medicine

## 2022-11-30 ENCOUNTER — Ambulatory Visit: Payer: PPO | Admitting: Physical Therapy

## 2022-11-30 VITALS — BP 150/62 | HR 75 | Temp 98.1°F | Ht 59.0 in | Wt 154.5 lb

## 2022-11-30 DIAGNOSIS — G629 Polyneuropathy, unspecified: Secondary | ICD-10-CM | POA: Insufficient documentation

## 2022-11-30 DIAGNOSIS — G6289 Other specified polyneuropathies: Secondary | ICD-10-CM | POA: Diagnosis not present

## 2022-11-30 MED ORDER — LIDOCAINE 5 % EX OINT: 1.0000 | TOPICAL_OINTMENT | CUTANEOUS | 1 refills | Status: DC | PRN

## 2022-11-30 MED ORDER — PREGABALIN 25 MG PO CAPS: 25.0000 mg | ORAL_CAPSULE | Freq: Every day | ORAL | 1 refills | Status: DC

## 2022-11-30 NOTE — Assessment & Plan Note (Signed)
We discussed medications, will prescribe lidocaine 5 % ointment daily and start her on low dose lyrica 25 mg at bedtime. I will see her back in 1 month for her regular follow up.

## 2022-11-30 NOTE — Progress Notes (Signed)
Established Patient Office Visit  Subjective   Patient ID: CECILIA VANCLEVE, female    DOB: 05-03-1934  Age: 89 y.o. MRN: [id number]  Chief Complaint  Patient presents with   Results    Patient presents to discuss EMG test results    Patient is here today to discuss her leg and foot pain. States that it has been 7 months and it has worsened over time. Patient has been taking magnesium OTC and using muscle rubs with lidocaine in it  and these have been helping her. States that the worst of it is in her left foot and toes. Her left  calf muscle also experiences severe pain, like charlie horses in the left calf. I reviewed the results of her EMG and we discussed treatment options.     Current Outpatient Medications  Medication Instructions   acetaminophen (TYLENOL) 500-1,000 mg, Oral, Every 6 hours PRN   AMBULATORY NON FORMULARY MEDICATION Medication Name: GI Cocktail - 90 ml 2% Lidocaine, 90 ml Dicyclomine 10mg /48ml, 270 ml Maalox - Take 5-10 ml every 6 hour as needed.   carvedilol (COREG) 3.125 MG tablet Take 1/2 tab PO BID   cetirizine (ZYRTEC) 10 mg, Oral, Daily   Cholecalciferol 1,000 Units, Oral, Daily   clobetasol ointment (TEMOVATE) 0.05 % Apply to affected area twice weekly.   esomeprazole (NEXIUM) 40 mg, Oral, 2 times daily before meals   ezetimibe (ZETIA) 10 MG tablet TAKE 1 TABLET BY MOUTH DAILY   furosemide (LASIX) 20 MG tablet 1 tablet by mouth every other day as needed for swelling   hydrALAZINE (APRESOLINE) 12.5 mg, Oral, 2 times daily   lidocaine (XYLOCAINE) 5 % ointment 1 Application, Topical, As needed   magnesium oxide (MAG-OX) 400 mg, Oral, Daily   pregabalin (LYRICA) 25 mg, Oral, Daily at bedtime   rosuvastatin (CRESTOR) 2.5 mg, Oral, Daily   spironolactone (ALDACTONE) 25 mg, Oral, 2 times daily    Patient Active Problem List   Diagnosis Date Noted   Peripheral neuropathy 11/30/2022   Osteoporosis of femur without pathological fracture 06/24/2019   Dyspnea  02/03/2019   Asthma 02/03/2019   Vertigo 06/26/2017   Hyperparathyroidism (HCC) 08/24/2014   Slow transit constipation 01/13/2014   IBS (irritable bowel syndrome), GERD, hx colon polyps, chronic abd pain - followed by Dr. Jarold Motto in GI 06/11/2012   Osteopenia 08/29/2011   Vitamin D deficiency 08/29/2011   GERD (gastroesophageal reflux disease) 11/23/2010   Dyslipidemia 09/09/2007   Hypertension, essential, benign 09/09/2007      Review of Systems  All other systems reviewed and are negative.     Objective:     BP (!) 150/62 (BP Location: Left Arm, Patient Position: Sitting, Cuff Size: Large)   Pulse 75   Temp 98.1 F (36.7 C) (Oral)   Ht 4\' 11"  (1.499 m)   Wt 154 lb 8 oz (70.1 kg)   SpO2 98%   BMI 31.21 kg/m    Physical Exam Vitals reviewed.  Constitutional:      Appearance: Normal appearance. She is overweight.  Eyes:     Conjunctiva/sclera: Conjunctivae normal.  Cardiovascular:     Pulses: Normal pulses.  Pulmonary:     Effort: Pulmonary effort is normal.  Musculoskeletal:     Right lower leg: No edema.     Left lower leg: No edema.  Neurological:     General: No focal deficit present.     Mental Status: She is alert and oriented to person, place, and time. Mental status  is at baseline.      No results found for any visits on 11/30/22.    The ASCVD Risk score (Arnett DK, et al., 2019) failed to calculate for the following reasons:   The 2019 ASCVD risk score is only valid for ages 80 to 30    Assessment & Plan:  Other polyneuropathy Assessment & Plan: We discussed medications, will prescribe lidocaine 5 % ointment daily and start her on low dose lyrica 25 mg at bedtime. I will see her back in 1 month for her regular follow up.  Orders: -     Pregabalin; Take 1 capsule (25 mg total) by mouth at bedtime.  Dispense: 30 capsule; Refill: 1 -     Lidocaine; Apply 1 Application topically as needed.  Dispense: 240 g; Refill: 1     No follow-ups on  file.    Karie Georges, MD

## 2022-12-01 ENCOUNTER — Telehealth: Payer: Self-pay | Admitting: Family Medicine

## 2022-12-01 DIAGNOSIS — G629 Polyneuropathy, unspecified: Secondary | ICD-10-CM

## 2022-12-01 NOTE — Telephone Encounter (Signed)
Pt states the prescription for lidocaine (XYLOCAINE) 5 % ointment needs additional information (PA) and the pharmacy has faxed the request over.

## 2022-12-04 NOTE — Telephone Encounter (Signed)
PA sent to Covermymeds.com-Key: [id number]) pending review by insurance.

## 2022-12-05 ENCOUNTER — Other Ambulatory Visit: Payer: Self-pay | Admitting: Family Medicine

## 2022-12-05 MED ORDER — DICLOFENAC SODIUM 1 % EX GEL
2.0000 g | Freq: Four times a day (QID) | CUTANEOUS | 2 refills | Status: DC | PRN
Start: 2022-12-05 — End: 2023-01-01

## 2022-12-05 NOTE — Telephone Encounter (Signed)
Fax received from [facility] with denial and given to PCP for review.

## 2022-12-05 NOTE — Addendum Note (Signed)
Addended by: Karie Georges on: 12/05/2022 08:46 AM   Modules accepted: Orders

## 2022-12-11 ENCOUNTER — Other Ambulatory Visit: Payer: Self-pay | Admitting: Cardiology

## 2022-12-13 ENCOUNTER — Encounter: Payer: Self-pay | Admitting: Gastroenterology

## 2022-12-13 ENCOUNTER — Encounter (INDEPENDENT_AMBULATORY_CARE_PROVIDER_SITE_OTHER): Payer: Self-pay

## 2023-01-01 ENCOUNTER — Ambulatory Visit (INDEPENDENT_AMBULATORY_CARE_PROVIDER_SITE_OTHER): Payer: PPO | Admitting: Family Medicine

## 2023-01-01 ENCOUNTER — Encounter: Payer: Self-pay | Admitting: Family Medicine

## 2023-01-01 VITALS — BP 148/80 | HR 60 | Temp 98.2°F | Ht 59.0 in | Wt 155.0 lb

## 2023-01-01 DIAGNOSIS — E213 Hyperparathyroidism, unspecified: Secondary | ICD-10-CM

## 2023-01-01 DIAGNOSIS — K581 Irritable bowel syndrome with constipation: Secondary | ICD-10-CM

## 2023-01-01 DIAGNOSIS — E7800 Pure hypercholesterolemia, unspecified: Secondary | ICD-10-CM | POA: Diagnosis not present

## 2023-01-01 MED ORDER — ROSUVASTATIN CALCIUM 5 MG PO TABS
2.5000 mg | ORAL_TABLET | Freq: Every day | ORAL | 1 refills | Status: DC
Start: 2023-01-01 — End: 2023-07-24

## 2023-01-01 MED ORDER — EZETIMIBE 10 MG PO TABS
ORAL_TABLET | ORAL | 1 refills | Status: DC
Start: 2023-01-01 — End: 2023-04-30

## 2023-01-01 MED ORDER — ONDANSETRON HCL 4 MG PO TABS: 4.0000 mg | ORAL_TABLET | Freq: Three times a day (TID) | ORAL | 0 refills | Status: DC | PRN

## 2023-01-01 NOTE — Patient Instructions (Signed)
Debrox ear drops-- 2-3 drops per ear every other day or use warm water and hydrogen peroxide in the ear to soften the wax.

## 2023-01-01 NOTE — Progress Notes (Signed)
Established Patient Office Visit  Subjective   Patient ID: Robin [name], female    DOB: Nov 12, 1933  Age: 89 y.o. MRN: [id number]  Chief Complaint  Patient presents with   Medical Management of Chronic Issues    Patient is here for follow up on her neuropathy. States that the insurance would not cover the cost, so she paid for the medication, states that she didn't take it at all. States that the pain subsided and she felt like she didn't need the medication.   Patient is reporting her stomach is bothering her. States she has an appointment with her GI doctor. States he wants to do another endoscopy, she is not sure this is going to show anything. We discussed other testing that could be ordered. States that if the endoscopy doesn't show anything she would like a new referral to [facility] for a second opinion.   Pt reports she spoke with the eye doctor about the "eye migraines" she has been having, states that she had an episode the other day. States that she is wondering if there is anything else that can be causing these issues.   We reviewed her labs, her Calcium level is 11.4, pt has had this for years, she had seen Dr. Lucianne Muss in 2020 and 2021 and was diagnosed with hyperparathyroidism. I advised we check her PTH and vitamin D level. The patient does not remember why the bisphosphonate was not continued. Pt also has osteoporosis on previous DEXA scan.      Current Outpatient Medications  Medication Instructions   acetaminophen (TYLENOL) 500-1,000 mg, Oral, Every 6 hours PRN   AMBULATORY NON FORMULARY MEDICATION Medication Name: GI Cocktail - 90 ml 2% Lidocaine, 90 ml Dicyclomine 10mg /40ml, 270 ml Maalox - Take 5-10 ml every 6 hour as needed.   carvedilol (COREG) 3.125 MG tablet TAKE 1 TABLET BY MOUTH TWICE DAILY WITH A MEAL   cetirizine (ZYRTEC) 10 mg, Oral, Daily   Cholecalciferol 1,000 Units, Oral, Daily   clobetasol ointment (TEMOVATE) 0.05 % Apply to affected area twice weekly.    esomeprazole (NEXIUM) 40 mg, Oral, 2 times daily before meals   ezetimibe (ZETIA) 10 MG tablet TAKE 1 TABLET BY MOUTH DAILY   furosemide (LASIX) 20 MG tablet 1 tablet by mouth every other day as needed for swelling   hydrALAZINE (APRESOLINE) 12.5 mg, Oral, 2 times daily   magnesium oxide (MAG-OX) 400 mg, Oral, Daily   ondansetron (ZOFRAN) 4 mg, Oral, Every 8 hours PRN   rosuvastatin (CRESTOR) 2.5 mg, Oral, Daily   spironolactone (ALDACTONE) 25 mg, Oral, 2 times daily    Patient Active Problem List   Diagnosis Date Noted   HYPERCHOLESTEROLEMIA 01/05/2023   Peripheral neuropathy 11/30/2022   Osteoporosis of femur without pathological fracture 06/24/2019   Dyspnea 02/03/2019   Asthma 02/03/2019   Vertigo 06/26/2017   Hyperparathyroidism (HCC) 08/24/2014   Slow transit constipation 01/13/2014   IBS (irritable bowel syndrome), GERD, hx colon polyps, chronic abd pain - followed by Dr. Jarold Motto in GI 06/11/2012   Osteopenia 08/29/2011   Vitamin D deficiency 08/29/2011   GERD (gastroesophageal reflux disease) 11/23/2010   Dyslipidemia 09/09/2007   Hypertension, essential, benign 09/09/2007      Review of Systems  All other systems reviewed and are negative.     Objective:     BP (!) 148/80 (BP Location: Right Arm, Patient Position: Sitting, Cuff Size: Large)   Pulse 60   Temp 98.2 F (36.8 C) (Oral)   Ht  4\' 11"  (1.499 m)   Wt 155 lb (70.3 kg)   SpO2 98%   BMI 31.31 kg/m    Physical Exam Vitals reviewed.  Constitutional:      Appearance: Normal appearance. She is overweight.  Eyes:     Conjunctiva/sclera: Conjunctivae normal.  Pulmonary:     Effort: Pulmonary effort is normal.  Neurological:     Mental Status: She is alert and oriented to person, place, and time. Mental status is at baseline.  Psychiatric:        Mood and Affect: Mood normal.        Behavior: Behavior normal.     Last lipids Lab Results  Component Value Date   CHOL 166 10/03/2021   HDL  59.50 10/03/2021   LDLCALC 87 10/03/2021   TRIG 94.0 10/03/2021   CHOLHDL 3 10/03/2021      The ASCVD Risk score (Arnett DK, et al., 2019) failed to calculate for the following reasons:   The 2019 ASCVD risk score is only valid for ages 41 to 33    Assessment & Plan:  Hyperparathyroidism [facility]) Assessment & Plan: Has seen endo in the past, was started on actonel according to the note, however pt states she is not sure why this was not continued. Will check new vitamin D and PTH level today. We discussed restarting her bisphosphonate depending on the results.   Orders: -     Parathyroid hormone, intact (no Ca) -     VITAMIN D 25 Hydroxy (Vit-D Deficiency, Fractures)  HYPERCHOLESTEROLEMIA Assessment & Plan: Reviewed last lipid panel, continue zetia 10 mg and rosuvastatin 2.5 mg daily.  Orders: -     Ezetimibe; TAKE 1 TABLET BY MOUTH DAILY  Dispense: 90 tablet; Refill: 1 -     Rosuvastatin Calcium; Take 0.5 tablets (2.5 mg total) by mouth daily.  Dispense: 45 tablet; Refill: 1  Irritable bowel syndrome with constipation Assessment & Plan: Continued gut issues/ chronic nausea, will refill ondansetron 4 mg as needed for nausea. GI work up is on going.   Orders: -     Ondansetron HCl; Take 1 tablet (4 mg total) by mouth every 8 (eight) hours as needed for nausea or vomiting.  Dispense: 20 tablet; Refill: 0     Return in about 6 months (around 07/04/2023).    Karie Georges, MD

## 2023-01-02 LAB — PARATHYROID HORMONE, INTACT (NO CA): PTH: 91 pg/mL — ABNORMAL HIGH (ref 16–77)

## 2023-01-02 LAB — VITAMIN D 25 HYDROXY (VIT D DEFICIENCY, FRACTURES): VITD: 43.59 ng/mL (ref 30.00–100.00)

## 2023-01-03 ENCOUNTER — Telehealth: Payer: Self-pay | Admitting: Family Medicine

## 2023-01-03 NOTE — Telephone Encounter (Signed)
Pt called, returning CMA's call. CMA was with a patient. Pt asked that CMA call back at her earliest convenience, stating she has one more question.

## 2023-01-03 NOTE — Telephone Encounter (Signed)
Spoke with the patient and she wanted to ask if it would be OK to begin taking Fosamax after the endoscopy on 9/9? Stated she recalls discussing this during the visit, could not recall why she didn't take this before and now thinks it may have been due to some type of stomach problem.  Message sent to PCP.

## 2023-01-04 NOTE — Telephone Encounter (Signed)
Yes we can wait on the script, if she has stomach issues with the fosamax we could try once monthly boniva instead

## 2023-01-04 NOTE — Telephone Encounter (Signed)
Patient informed of the message below.

## 2023-01-05 ENCOUNTER — Ambulatory Visit (INDEPENDENT_AMBULATORY_CARE_PROVIDER_SITE_OTHER): Payer: PPO

## 2023-01-05 ENCOUNTER — Telehealth: Payer: Self-pay

## 2023-01-05 VITALS — Ht 59.0 in | Wt 150.0 lb

## 2023-01-05 DIAGNOSIS — Z0000 Encounter for general adult medical examination without abnormal findings: Secondary | ICD-10-CM | POA: Diagnosis not present

## 2023-01-05 DIAGNOSIS — E7800 Pure hypercholesterolemia, unspecified: Secondary | ICD-10-CM | POA: Insufficient documentation

## 2023-01-05 NOTE — Telephone Encounter (Signed)
Unsuccessful attempt to reach patient on preferred number listed in notes for scheduled AWV. Left message on voicemail okay to reschedule. 

## 2023-01-05 NOTE — Progress Notes (Signed)
Subjective:   Robin [name] is a 89 y.o. female who presents for Medicare Annual (Subsequent) preventive examination.  Visit Complete: Virtual  I connected with  Robin [name] on 01/05/23 by a audio enabled telemedicine application and verified that I am speaking with the correct person using two identifiers.  Patient Location: Home  Provider Location: Home Office  I discussed the limitations of evaluation and management by telemedicine. The patient expressed understanding and agreed to proceed.     Review of Systems    Vital Signs: Unable to obtain new vitals due to this being a telehealth visit.  Cardiac Risk Factors include: advanced age (>68men, >27 women);hypertension     Objective:    Today's Vitals   01/05/23 1612  Weight: 150 lb (68 kg)  Height: 4\' 11"  (1.499 m)   Body mass index is 30.3 kg/m.     01/05/2023    4:21 PM 10/26/2022    9:59 AM 11/28/2021    9:42 AM 04/23/2021    6:43 AM 11/24/2020    9:35 AM 11/24/2019    2:09 PM 10/31/2018   11:32 AM  Advanced Directives  Does Patient Have a Medical Advance Directive? No No No No No No Yes  Type of Advance Directive       Living will  Does patient want to make changes to medical advance directive?       No - Patient declined  Would patient like information on creating a medical advance directive? No - Patient declined  No - Patient declined  No - Patient declined No - Patient declined     Current Medications (verified) Outpatient Encounter Medications as of 01/05/2023  Medication Sig   acetaminophen (TYLENOL) 500 MG tablet Take 500-1,000 mg by mouth every 6 (six) hours as needed for mild pain or headache.    AMBULATORY NON FORMULARY MEDICATION Medication Name: GI Cocktail - 90 ml 2% Lidocaine, 90 ml Dicyclomine 10mg /65ml, 270 ml Maalox - Take 5-10 ml every 6 hour as needed.   carvedilol (COREG) 3.125 MG tablet TAKE 1 TABLET BY MOUTH TWICE DAILY WITH A MEAL   cetirizine (ZYRTEC) 10 MG tablet Take 10 mg by  mouth daily.   Cholecalciferol 25 MCG (1000 UT) TBDP Take 1,000 Units by mouth daily.   clobetasol ointment (TEMOVATE) 0.05 % Apply to affected area twice weekly.   esomeprazole (NEXIUM) 40 MG capsule Take 1 capsule (40 mg total) by mouth 2 (two) times daily before a meal.   ezetimibe (ZETIA) 10 MG tablet TAKE 1 TABLET BY MOUTH DAILY   furosemide (LASIX) 20 MG tablet 1 tablet by mouth every other day as needed for swelling   hydrALAZINE (APRESOLINE) 25 MG tablet Take 0.5 tablets (12.5 mg total) by mouth 2 (two) times daily.   magnesium oxide (MAG-OX) 400 (240 Mg) MG tablet Take 400 mg by mouth daily.   ondansetron (ZOFRAN) 4 MG tablet Take 1 tablet (4 mg total) by mouth every 8 (eight) hours as needed for nausea or vomiting.   rosuvastatin (CRESTOR) 5 MG tablet Take 0.5 tablets (2.5 mg total) by mouth daily.   spironolactone (ALDACTONE) 25 MG tablet Take 1 tablet (25 mg total) by mouth 2 (two) times daily.   No facility-administered encounter medications on file as of 01/05/2023.    Allergies (verified) Prilosec [omeprazole], Amlodipine, Atenolol, Bystolic [nebivolol hcl], Chlorthalidone, Clonidine derivatives, Losartan, Protonix [pantoprazole sodium], Amoxicillin, and Flagyl [metronidazole]   History: Past Medical History:  Diagnosis Date   Abnormal Pap smear  of cervix    Allergy    Arthritis    Asthma    Cataract 2018   bilateral eyes   Colon polyps    tubulovillous adenoma, 2010   External hemorrhoids without mention of complication    GERD (gastroesophageal reflux disease)    Headache    eval with neuro in 2011   Hyperlipemia    Hyperparathyroidism (HCC)    seeing Dr. Lucianne Muss in endocrinology   Hypertension    IBS (irritable bowel syndrome), GERD, hx colon polyps, chronic abd pain - followed by Dr. Jarold Motto in GI 06/11/2012   Osteoporosis 08/29/2011   Vertigo    eval with neuro in 2011, brief recurrence 2016   Past Surgical History:  Procedure Laterality Date   BACK  SURGERY  1974   CARPAL TUNNEL RELEASE Left 06/2008   COLONOSCOPY     EYE SURGERY  07/2017   bilateral cataract extraction   POLYPECTOMY     TONSILLECTOMY     UPPER GASTROINTESTINAL ENDOSCOPY     Family History  Problem Relation Age of Onset   CAD Mother        Died of MI at age 34   Hypertension Mother    Hypertension Father    Stroke Father    Diabetes Sister    Hypertension Sister    Heart disease Sister        heart failure   Stroke Sister 34   Hypertension Sister    Diabetes Brother    Hypertension Brother    Stroke Brother 80   CAD Brother        Died of MI at age 49   Hypertension Brother    Stroke Brother 60   Hypertension Brother    Breast cancer Niece    Colon cancer Neg Hx    Colon polyps Neg Hx    Esophageal cancer Neg Hx    Pancreatic cancer Neg Hx    Stomach cancer Neg Hx    Rectal cancer Neg Hx    Social History   Socioeconomic History   Marital status: Widowed    Spouse name: Not on file   Number of children: 2   Years of education: Not on file   Highest education level: Not on file  Occupational History   Occupation: Engineer, structural    Comment: retired    Associate Professor: RETIRED  Tobacco Use   Smoking status: Never   Smokeless tobacco: Never  Vaping Use   Vaping status: Never Used  Substance and Sexual Activity   Alcohol use: No    Alcohol/week: 0.0 standard drinks of alcohol   Drug use: No   Sexual activity: Not Currently    Birth control/protection: Post-menopausal  Other Topics Concern   Not on file  Social History Narrative   Lives alone in a one story home.  Has 2 children, one son and one daughter with 2 grandkids and 1 great-grandchild.    Retired from working for a bank.     Education: high school.    Social Determinants of Health   Financial Resource Strain: Low Risk  (01/05/2023)   Overall Financial Resource Strain (CARDIA)    Difficulty of Paying Living Expenses: Not hard at all  Food Insecurity: No Food Insecurity  (01/05/2023)   Hunger Vital Sign    Worried About Running Out of Food in the Last Year: Never true    Ran Out of Food in the Last Year: Never true  Transportation Needs: No Transportation Needs (  01/05/2023)   PRAPARE - Administrator, Civil Service (Medical): No    Lack of Transportation (Non-Medical): No  Physical Activity: Insufficiently Active (01/05/2023)   Exercise Vital Sign    Days of Exercise per Week: 3 days    Minutes of Exercise per Session: 20 min  Stress: No Stress Concern Present (01/05/2023)   [name] of Occupational Health - Occupational Stress Questionnaire    Feeling of Stress : Not at all  Social Connections: Socially Isolated (01/05/2023)   Social Connection and Isolation Panel [NHANES]    Frequency of Communication with Friends and Family: More than three times a week    Frequency of Social Gatherings with Friends and Family: More than three times a week    Attends Religious Services: Never    Database administrator or Organizations: No    Attends Banker Meetings: Never    Marital Status: Widowed    Tobacco Counseling Counseling given: Not Answered   Clinical Intake:  Pre-visit preparation completed: Yes  Pain : No/denies pain     BMI - recorded: 30.3 Nutritional Status: BMI > 30  Obese Nutritional Risks: None Diabetes: No  How often do you need to have someone help you when you read instructions, pamphlets, or other written materials from your doctor or pharmacy?: 1 - Never  Interpreter Needed?: No  Information entered by :: Theresa Mulligan LPN   Activities of Daily Living    01/05/2023    4:19 PM  In your present state of health, do you have any difficulty performing the following activities:  Hearing? 0  Vision? 0  Difficulty concentrating or making decisions? 0  Walking or climbing stairs? 0  Dressing or bathing? 0  Doing errands, shopping? 0  Preparing Food and eating ? N  Using the Toilet? N  In the  past six months, have you accidently leaked urine? N  Do you have problems with loss of bowel control? N  Managing your Medications? N  Managing your Finances? N  Housekeeping or managing your Housekeeping? N    Patient Care Team: Karie Georges, MD as PCP - General (Family Medicine) Jens Som Madolyn Frieze, MD as PCP - Cardiology (Cardiology) Meryl Dare, MD as Consulting Physician (Gastroenterology) Jens Som Madolyn Frieze, MD as Consulting Physician (Cardiology) Janet Berlin, MD as Consulting Physician (Ophthalmology) Jerene Bears, MD as Consulting Physician (Gynecology) Verner Chol, [facility] (Inactive) as Pharmacist (Pharmacist)  Indicate any recent Medical Services you may have received from other than [facility] providers in the past year (date may be approximate).     Assessment:   This is a routine wellness examination for Robin [name].  Hearing/Vision screen Hearing Screening - Comments:: Denies hearing difficulties   Vision Screening - Comments:: Wears rx glasses - up to date with routine eye exams with  [facility]  Dietary issues and exercise activities discussed:     Goals Addressed               This Visit's Progress     I want to Survive (pt-stated)         Depression Screen    01/05/2023    4:18 PM 01/01/2023    1:02 PM 10/03/2022    3:20 PM 11/28/2021    9:38 AM 04/27/2021    8:44 AM 11/24/2020    9:34 AM 11/24/2019    2:11 PM  PHQ 2/9 Scores  PHQ - 2 Score 0 0 0 0 0 0 0  PHQ- 9 Score 0 0   1  0    Fall Risk    01/05/2023    4:20 PM 01/01/2023    1:01 PM 10/03/2022    3:20 PM 11/28/2021    9:41 AM 11/24/2020    9:36 AM  Fall Risk   Falls in the past year? 0 0 0 0 0  Number falls in past yr: 0 0 0 0 0  Injury with Fall? 0 0 0 0 0  Risk for fall due to : No Fall Risks No Fall Risks Other (Comment) No Fall Risks Impaired vision  Follow up Falls prevention discussed Falls evaluation completed Falls evaluation completed  Falls prevention discussed     MEDICARE RISK AT HOME: Medicare Risk at Home Any stairs in or around the home?: No If so, are there any without handrails?: No Home free of loose throw rugs in walkways, pet beds, electrical cords, etc?: Yes Adequate lighting in your home to reduce risk of falls?: Yes Life alert?: No Use of a cane, walker or w/c?: No Grab bars in the bathroom?: No Shower chair or bench in shower?: No Elevated toilet seat or a handicapped toilet?: No  TIMED UP AND GO:  Was the test performed?  No    Cognitive Function:    02/13/2017   10:21 AM  MMSE - Mini Mental State Exam  Not completed: --        01/05/2023    4:21 PM 11/28/2021    9:42 AM 11/24/2020    9:37 AM 11/24/2019    2:14 PM  6CIT Screen  What Year? 0 points 0 points 0 points 0 points  What month? 0 points 0 points 0 points 0 points  What time? 0 points 0 points 0 points 0 points  Count back from 20 0 points 0 points 0 points 0 points  Months in reverse 0 points 0 points 0 points 0 points  Repeat phrase 0 points 0 points 0 points 0 points  Total Score 0 points 0 points 0 points 0 points    Immunizations Immunization History  Administered Date(s) Administered   COVID-19, mRNA, vaccine(Comirnaty)12 years and older 02/28/2022   Fluad Quad(high Dose 65+) 03/04/2019, 02/02/2020, 03/28/2021, 02/06/2022   Influenza, High Dose Seasonal PF 03/23/2015, 02/22/2016, 02/13/2017, 03/11/2018   Influenza,inj,Quad PF,6+ Mos 03/07/2013, 03/03/2014   PFIZER(Purple Top)SARS-COV-2 Vaccination 06/21/2019, 07/12/2019, 03/30/2020, 12/03/2020   Pneumococcal Conjugate-13 08/10/2014   Pneumococcal Polysaccharide-23 07/23/2012   Tdap 07/23/2012   Zoster, Live 09/30/2013    TDAP status: Due, Education has been provided regarding the importance of this vaccine. Advised may receive this vaccine at local pharmacy or Health Dept. Aware to provide a copy of the vaccination record if obtained from local pharmacy or Health Dept. Verbalized acceptance and  understanding.  Flu Vaccine status: Due, Education has been provided regarding the importance of this vaccine. Advised may receive this vaccine at local pharmacy or Health Dept. Aware to provide a copy of the vaccination record if obtained from local pharmacy or Health Dept. Verbalized acceptance and understanding.  Pneumococcal vaccine status: Up to date  Covid-19 vaccine status: Declined, Education has been provided regarding the importance of this vaccine but patient still declined. Advised may receive this vaccine at local pharmacy or Health Dept.or vaccine clinic. Aware to provide a copy of the vaccination record if obtained from local pharmacy or Health Dept. Verbalized acceptance and understanding.  Qualifies for Shingles Vaccine? Yes   Zostavax completed No   Shingrix Completed?:  No.    Education has been provided regarding the importance of this vaccine. Patient has been advised to call insurance company to determine out of pocket expense if they have not yet received this vaccine. Advised may also receive vaccine at local pharmacy or Health Dept. Verbalized acceptance and understanding.  Screening Tests Health Maintenance  Topic Date Due   Zoster Vaccines- Shingrix (1 of 2) 12/28/1985   DTaP/Tdap/Td (2 - Td or Tdap) 07/24/2022   INFLUENZA VACCINE  12/14/2022   COVID-19 Vaccine (6 - 2023-24 season) 01/17/2023 (Originally 07/01/2022)   Medicare Annual Wellness (AWV)  01/05/2024   Pneumonia Vaccine 71+ Years old  Completed   DEXA SCAN  Completed   HPV VACCINES  Aged Out    Health Maintenance  Health Maintenance Due  Topic Date Due   Zoster Vaccines- Shingrix (1 of 2) 12/28/1985   DTaP/Tdap/Td (2 - Td or Tdap) 07/24/2022   INFLUENZA VACCINE  12/14/2022    Colorectal cancer screening: No longer required.   Mammogram status: No longer required due to Age.  Bone Density status: Completed 03/18/19. Results reflect: Bone density results: OSTEOPOROSIS. Repeat every    years.  Lung Cancer Screening: (Low Dose CT Chest recommended if Age 36-80 years, 20 pack-year currently smoking OR have quit w/in 15years.) does not qualify.     Additional Screening:  Hepatitis C Screening: does not qualify; Completed   Vision Screening: Recommended annual ophthalmology exams for early detection of glaucoma and other disorders of the eye. Is the patient up to date with their annual eye exam?  Yes  Who is the provider or what is the name of the office in which the patient attends annual eye exams? [facility] If pt is not established with a provider, would they like to be referred to a provider to establish care? No .   Dental Screening: Recommended annual dental exams for proper oral hygiene    Community Resource Referral / Chronic Care Management:  CRR required this visit?  No   CCM required this visit?  No     Plan:     I have personally reviewed and noted the following in the patient's chart:   Medical and social history Use of alcohol, tobacco or illicit drugs  Current medications and supplements including opioid prescriptions. Patient is not currently taking opioid prescriptions. Functional ability and status Nutritional status Physical activity Advanced directives List of other physicians Hospitalizations, surgeries, and ER visits in previous 12 months Vitals Screenings to include cognitive, depression, and falls Referrals and appointments  In addition, I have reviewed and discussed with patient certain preventive protocols, quality metrics, and best practice recommendations. A written personalized care plan for preventive services as well as general preventive health recommendations were provided to patient.     Tillie Rung, LPN   [id number]/61/0960   After Visit Summary: (MyChart) Due to this being a telephonic visit, the after visit summary with patients personalized plan was offered to patient via MyChart   Nurse Notes:  None

## 2023-01-05 NOTE — Patient Instructions (Addendum)
Robin [name] , Thank you for taking time to come for your Medicare Wellness Visit. I appreciate your ongoing commitment to your health goals. Please review the following plan we discussed and let me know if I can assist you in the future.   Referrals/Orders/Follow-Ups/Clinician Recommendations:   This is a list of the screening recommended for you and due dates:  Health Maintenance  Topic Date Due   Zoster (Shingles) Vaccine (1 of 2) 12/28/1985   DTaP/Tdap/Td vaccine (2 - Td or Tdap) 07/24/2022   Flu Shot  12/14/2022   COVID-19 Vaccine (6 - 2023-24 season) 01/17/2023*   Medicare Annual Wellness Visit  01/05/2024   Pneumonia Vaccine  Completed   DEXA scan (bone density measurement)  Completed   HPV Vaccine  Aged Out  *Topic was postponed. The date shown is not the original due date.    Advanced directives: (Declined) Advance directive discussed with you today. Even though you declined this today, please call our office should you change your mind, and we can give you the proper paperwork for you to fill out.  Next Medicare Annual Wellness Visit scheduled for next year: Yes

## 2023-01-05 NOTE — Assessment & Plan Note (Signed)
Continued gut issues/ chronic nausea, will refill ondansetron 4 mg as needed for nausea. GI work up is on going.

## 2023-01-05 NOTE — Assessment & Plan Note (Signed)
Has seen endo in the past, was started on actonel according to the note, however pt states she is not sure why this was not continued. Will check new vitamin D and PTH level today. We discussed restarting her bisphosphonate depending on the results.

## 2023-01-05 NOTE — Assessment & Plan Note (Signed)
Reviewed last lipid panel, continue zetia 10 mg and rosuvastatin 2.5 mg daily.

## 2023-01-09 ENCOUNTER — Encounter: Payer: Self-pay | Admitting: Gastroenterology

## 2023-01-09 ENCOUNTER — Ambulatory Visit (AMBULATORY_SURGERY_CENTER): Payer: PPO

## 2023-01-09 VITALS — Ht 59.0 in | Wt 155.0 lb

## 2023-01-09 DIAGNOSIS — R1013 Epigastric pain: Secondary | ICD-10-CM

## 2023-01-09 DIAGNOSIS — G8929 Other chronic pain: Secondary | ICD-10-CM

## 2023-01-09 DIAGNOSIS — R110 Nausea: Secondary | ICD-10-CM

## 2023-01-09 NOTE — Progress Notes (Signed)
 No egg or soy allergy known to patient  No issues known to pt with past sedation with any surgeries or procedures Patient denies ever being told they had issues or difficulty with intubation  No FH of Malignant Hyperthermia Pt is not on diet pills Pt is not on  home 02  Pt is not on blood thinners  Pt denies issues with constipation  No A fib or A flutter Have any cardiac testing pending--no Pt can ambulate independently Pt denies use of chewing tobacco Discussed diabetic I weight loss medication holds Discussed NSAID holds Checked BMI Pt instructed to use Singlecare.com or GoodRx for a price reduction on prep  Patient's chart reviewed by Robin [name] CNRA prior to previsit and patient appropriate for the [facility].  Pre visit completed and red dot placed by patient's name on their procedure day (on provider's schedule).

## 2023-01-17 ENCOUNTER — Encounter: Payer: PPO | Admitting: Gastroenterology

## 2023-01-22 ENCOUNTER — Ambulatory Visit (AMBULATORY_SURGERY_CENTER): Payer: PPO | Admitting: Gastroenterology

## 2023-01-22 ENCOUNTER — Other Ambulatory Visit: Payer: PPO

## 2023-01-22 ENCOUNTER — Encounter: Payer: Self-pay | Admitting: Gastroenterology

## 2023-01-22 VITALS — BP 160/68 | HR 65 | Temp 97.0°F | Resp 15 | Ht 59.0 in | Wt 155.0 lb

## 2023-01-22 DIAGNOSIS — J45909 Unspecified asthma, uncomplicated: Secondary | ICD-10-CM | POA: Diagnosis not present

## 2023-01-22 DIAGNOSIS — G8929 Other chronic pain: Secondary | ICD-10-CM

## 2023-01-22 DIAGNOSIS — I10 Essential (primary) hypertension: Secondary | ICD-10-CM | POA: Diagnosis not present

## 2023-01-22 DIAGNOSIS — R110 Nausea: Secondary | ICD-10-CM | POA: Diagnosis not present

## 2023-01-22 DIAGNOSIS — R1013 Epigastric pain: Secondary | ICD-10-CM | POA: Diagnosis not present

## 2023-01-22 DIAGNOSIS — K2950 Unspecified chronic gastritis without bleeding: Secondary | ICD-10-CM

## 2023-01-22 MED ORDER — SODIUM CHLORIDE 0.9 % IV SOLN: 500.0000 mL | Freq: Once | INTRAVENOUS | Status: DC

## 2023-01-22 NOTE — Progress Notes (Signed)
Pt's states no medical or surgical changes since previsit or office visit. 

## 2023-01-22 NOTE — Progress Notes (Signed)
History & Physical  Primary Care Physician:  Karie Georges, MD Primary Gastroenterologist: Claudette Head, MD  Impression / Plan:  Chronic epigastric pain and nausea for EGD.  CHIEF COMPLAINT: Chronic epigastric pain and nausea  HPI: Robin [name] is a 89 y.o. female with chronic epigastric pain and nausea for EGD.    Past Medical History:  Diagnosis Date   Abnormal Pap smear of cervix    Allergy    Arthritis    Asthma    Cataract 2018   bilateral eyes   Colon polyps    tubulovillous adenoma, 2010   External hemorrhoids without mention of complication    GERD (gastroesophageal reflux disease)    Headache    eval with neuro in 2011   Hyperlipemia    Hyperparathyroidism (HCC)    seeing Dr. Lucianne Muss in endocrinology   Hypertension    IBS (irritable bowel syndrome), GERD, hx colon polyps, chronic abd pain - followed by Dr. Jarold Motto in GI 06/11/2012   Osteoporosis 08/29/2011   Vertigo    eval with neuro in 2011, brief recurrence 2016    Past Surgical History:  Procedure Laterality Date   BACK SURGERY  1974   CARPAL TUNNEL RELEASE Left 06/2008   COLONOSCOPY     EYE SURGERY  07/2017   bilateral cataract extraction   POLYPECTOMY     TONSILLECTOMY     UPPER GASTROINTESTINAL ENDOSCOPY      Prior to Admission medications   Medication Sig Start Date End Date Taking? Authorizing Provider  acetaminophen (TYLENOL) 500 MG tablet Take 500-1,000 mg by mouth every 6 (six) hours as needed for mild pain or headache.    Yes [provider]  carvedilol (COREG) 3.125 MG tablet TAKE 1 TABLET BY MOUTH TWICE DAILY WITH A MEAL 12/12/22  Yes Lewayne Bunting, MD  cetirizine (ZYRTEC) 10 MG tablet Take 10 mg by mouth daily.   Yes [provider]  Cholecalciferol 25 MCG (1000 UT) TBDP Take 1,000 Units by mouth daily. 11/18/18  Yes Romero Belling, MD  clobetasol ointment (TEMOVATE) 0.05 % Apply to affected area twice weekly. 06/10/21  Yes Genia Del, MD   esomeprazole (NEXIUM) 40 MG capsule Take 1 capsule (40 mg total) by mouth 2 (two) times daily before a meal. 07/03/22  Yes Karie Georges, MD  ezetimibe (ZETIA) 10 MG tablet TAKE 1 TABLET BY MOUTH DAILY 01/01/23  Yes Karie Georges, MD  hydrALAZINE (APRESOLINE) 25 MG tablet Take 0.5 tablets (12.5 mg total) by mouth 2 (two) times daily. 07/03/22  Yes Karie Georges, MD  magnesium oxide (MAG-OX) 400 (240 Mg) MG tablet Take 400 mg by mouth daily.   Yes [provider]  rosuvastatin (CRESTOR) 5 MG tablet Take 0.5 tablets (2.5 mg total) by mouth daily. 01/01/23  Yes Karie Georges, MD  spironolactone (ALDACTONE) 25 MG tablet Take 1 tablet (25 mg total) by mouth 2 (two) times daily. 07/03/22  Yes Karie Georges, MD  AMBULATORY NON FORMULARY MEDICATION Medication Name: GI Cocktail - 90 ml 2% Lidocaine, 90 ml Dicyclomine 10mg /17ml, 270 ml Maalox - Take 5-10 ml every 6 hour as needed. Patient not taking: Reported on 01/09/2023 10/21/21   Unk Lightning, PA  furosemide (LASIX) 20 MG tablet 1 tablet by mouth every other day as needed for swelling 07/19/20   Lewayne Bunting, MD  ondansetron (ZOFRAN) 4 MG tablet Take 1 tablet (4 mg total) by mouth every 8 (eight) hours as needed for nausea  or vomiting. 01/01/23   Karie Georges, MD    Current Outpatient Medications  Medication Sig Dispense Refill   acetaminophen (TYLENOL) 500 MG tablet Take 500-1,000 mg by mouth every 6 (six) hours as needed for mild pain or headache.      carvedilol (COREG) 3.125 MG tablet TAKE 1 TABLET BY MOUTH TWICE DAILY WITH A MEAL 180 tablet 3   cetirizine (ZYRTEC) 10 MG tablet Take 10 mg by mouth daily.     Cholecalciferol 25 MCG (1000 UT) TBDP Take 1,000 Units by mouth daily. 90 tablet 3   clobetasol ointment (TEMOVATE) 0.05 % Apply to affected area twice weekly. 60 g 4   esomeprazole (NEXIUM) 40 MG capsule Take 1 capsule (40 mg total) by mouth 2 (two) times daily before a meal. 180 capsule 3    ezetimibe (ZETIA) 10 MG tablet TAKE 1 TABLET BY MOUTH DAILY 90 tablet 1   hydrALAZINE (APRESOLINE) 25 MG tablet Take 0.5 tablets (12.5 mg total) by mouth 2 (two) times daily. 90 tablet 3   magnesium oxide (MAG-OX) 400 (240 Mg) MG tablet Take 400 mg by mouth daily.     rosuvastatin (CRESTOR) 5 MG tablet Take 0.5 tablets (2.5 mg total) by mouth daily. 45 tablet 1   spironolactone (ALDACTONE) 25 MG tablet Take 1 tablet (25 mg total) by mouth 2 (two) times daily. 180 tablet 3   AMBULATORY NON FORMULARY MEDICATION Medication Name: GI Cocktail - 90 ml 2% Lidocaine, 90 ml Dicyclomine 10mg /22ml, 270 ml Maalox - Take 5-10 ml every 6 hour as needed. (Patient not taking: Reported on 01/09/2023) 450 mL 3   furosemide (LASIX) 20 MG tablet 1 tablet by mouth every other day as needed for swelling 45 tablet 3   ondansetron (ZOFRAN) 4 MG tablet Take 1 tablet (4 mg total) by mouth every 8 (eight) hours as needed for nausea or vomiting. 20 tablet 0   Current Facility-Administered Medications  Medication Dose Route Frequency Provider Last Rate Last Admin   0.9 %  sodium chloride infusion  500 mL Intravenous Once Meryl Dare, MD        Allergies as of 01/22/2023 - Review Complete 01/22/2023  Allergen Reaction Noted   Prilosec [omeprazole] Cough 08/16/2016   Amlodipine  02/12/2019   Atenolol  04/26/2021   Bystolic [nebivolol hcl]  04/26/2021   Chlorthalidone  04/26/2021   Clonidine derivatives  04/26/2021   Losartan  04/26/2021   Protonix [pantoprazole sodium] Swelling 03/14/2016   Amoxicillin Rash 04/28/2009   Flagyl [metronidazole] Rash 04/28/2009    Family History  Problem Relation Age of Onset   CAD Mother        Died of MI at age 57   Hypertension Mother    Hypertension Father    Stroke Father    Diabetes Sister    Hypertension Sister    Heart disease Sister        heart failure   Stroke Sister 45   Hypertension Sister    Diabetes Brother    Hypertension Brother    Stroke Brother 85    CAD Brother        Died of MI at age 38   Hypertension Brother    Stroke Brother 60   Hypertension Brother    Breast cancer Niece    Colon cancer Neg Hx    Colon polyps Neg Hx    Esophageal cancer Neg Hx    Pancreatic cancer Neg Hx    Stomach cancer Neg Hx  Rectal cancer Neg Hx     Social History   Socioeconomic History   Marital status: Widowed    Spouse name: Not on file   Number of children: 2   Years of education: Not on file   Highest education level: Not on file  Occupational History   Occupation: Engineer, structural    Comment: retired    Associate Professor: RETIRED  Tobacco Use   Smoking status: Never   Smokeless tobacco: Never  Vaping Use   Vaping status: Never Used  Substance and Sexual Activity   Alcohol use: No    Alcohol/week: 0.0 standard drinks of alcohol   Drug use: No   Sexual activity: Not Currently    Birth control/protection: Post-menopausal  Other Topics Concern   Not on file  Social History Narrative   Lives alone in a one story home.  Has 2 children, one son and one daughter with 2 grandkids and 1 great-grandchild.    Retired from working for a bank.     Education: high school.    Social Determinants of Health   Financial Resource Strain: Low Risk  (01/05/2023)   Overall Financial Resource Strain (CARDIA)    Difficulty of Paying Living Expenses: Not hard at all  Food Insecurity: No Food Insecurity (01/05/2023)   Hunger Vital Sign    Worried About Running Out of Food in the Last Year: Never true    Ran Out of Food in the Last Year: Never true  Transportation Needs: No Transportation Needs (01/05/2023)   PRAPARE - Administrator, Civil Service (Medical): No    Lack of Transportation (Non-Medical): No  Physical Activity: Insufficiently Active (01/05/2023)   Exercise Vital Sign    Days of Exercise per Week: 3 days    Minutes of Exercise per Session: 20 min  Stress: No Stress Concern Present (01/05/2023)   [name] of Occupational  Health - Occupational Stress Questionnaire    Feeling of Stress : Not at all  Social Connections: Socially Isolated (01/05/2023)   Social Connection and Isolation Panel [NHANES]    Frequency of Communication with Friends and Family: More than three times a week    Frequency of Social Gatherings with Friends and Family: More than three times a week    Attends Religious Services: Never    Database administrator or Organizations: No    Attends Banker Meetings: Never    Marital Status: Widowed  Intimate Partner Violence: Not At Risk (01/05/2023)   Humiliation, Afraid, Rape, and Kick questionnaire    Fear of Current or Ex-Partner: No    Emotionally Abused: No    Physically Abused: No    Sexually Abused: No    Review of Systems:  All systems reviewed were negative except where noted in HPI.   Physical Exam:  General:  Alert, well-developed, in NAD Head:  Normocephalic and atraumatic. Eyes:  Sclera clear, no icterus.   Conjunctiva pink. Ears:  Normal auditory acuity. Mouth:  No deformity or lesions.  Neck:  Supple; no masses. Lungs:  Clear throughout to auscultation.   No wheezes, crackles, or rhonchi.  Heart:  Regular rate and rhythm; no murmurs. Abdomen:  Soft, nondistended, nontender. No masses, hepatomegaly. No palpable masses.  Normal bowel sounds.    Rectal:  Deferred   Msk:  Symmetrical without gross deformities. Extremities:  Without edema. Neurologic:  Alert and  oriented x 4; grossly normal neurologically. Skin:  Intact without significant lesions or rashes. Psych:  Alert  and cooperative. Normal mood and affect.   Venita Lick. Russella Dar  01/22/2023, 9:53 AM See Loretha Stapler, [facility], to contact our on call provider

## 2023-01-22 NOTE — Patient Instructions (Signed)
YOU HAD AN ENDOSCOPIC PROCEDURE TODAY AT THE [facility]:   Refer to the procedure report that was given to you for any specific questions about what was found during the examination.  If the procedure report does not answer your questions, please call your gastroenterologist to clarify.  If you requested that your care partner not be given the details of your procedure findings, then the procedure report has been included in a sealed envelope for you to review at your convenience later.  YOU SHOULD EXPECT: Some feelings of bloating in the abdomen. Passage of more gas than usual.  Walking can help get rid of the air that was put into your GI tract during the procedure and reduce the bloating. If you had a lower endoscopy (such as a colonoscopy or flexible sigmoidoscopy) you may notice spotting of blood in your stool or on the toilet paper. If you underwent a bowel prep for your procedure, you may not have a normal bowel movement for a few days.  Please Note:  You might notice some irritation and congestion in your nose or some drainage.  This is from the oxygen used during your procedure.  There is no need for concern and it should clear up in a day or so.  SYMPTOMS TO REPORT IMMEDIATELY:   Following upper endoscopy (EGD)  Vomiting of blood or coffee ground material  New chest pain or pain under the shoulder blades  Painful or persistently difficult swallowing  New shortness of breath  Fever of 100F or higher  Black, tarry-looking stools  For urgent or emergent issues, a gastroenterologist can be reached at any hour by calling [phone]. Do not use MyChart messaging for urgent concerns.    DIET:  We do recommend a small meal at first, but then you may proceed to your regular diet.  Drink plenty of fluids but you should avoid alcoholic beverages for 24 hours.  MEDICATIONS: Continue present medications.  Please see handouts given to you by your recovery nurse: Hiatal  Hernia.  FOLLOW UP: Await pathology results.  Thank you for allowing [place] to provide for  your healthcare needs today.  ACTIVITY:  You should plan to take it easy for the rest of today and you should NOT DRIVE or use heavy machinery until tomorrow (because of the sedation medicines used during the test).    FOLLOW UP: Our staff will call the number listed on your records the next business day following your procedure.  We will call around 7:15- 8:00 am to check on you and address any questions or concerns that you may have regarding the information given to you following your procedure. If we do not reach you, we will leave a message.     If any biopsies were taken you will be contacted by phone or by letter within the next 1-3 weeks.  Please call us at [phone] if you have not heard about the biopsies in 3 weeks.    SIGNATURES/CONFIDENTIALITY: You and/or your care partner have signed paperwork which will be entered into your electronic medical record.  These signatures attest to the fact that that the information above on your After Visit Summary has been reviewed and is understood.  Full responsibility of the confidentiality of this discharge information lies with you and/or your care-partner.

## 2023-01-22 NOTE — Progress Notes (Signed)
Called to room to assist during endoscopic procedure.  Patient ID and intended procedure confirmed with present staff. Received instructions for my participation in the procedure from the performing physician.  

## 2023-01-22 NOTE — Op Note (Signed)
[facility] Patient Name: Robin [name] Procedure Date: 01/22/2023 10:00 AM MRN: [id number] Endoscopist: Meryl Dare , MD, [phone] Age: 89 Referring MD:  Date of Birth: 1933/07/05 Gender: Female Account #: [id number] Procedure:                Upper GI endoscopy Indications:              Epigastric abdominal pain, Nausea Medicines:                Monitored Anesthesia Care Procedure:                Pre-Anesthesia Assessment:                           - Prior to the procedure, a History and Physical                            was performed, and patient medications and                            allergies were reviewed. The patient's tolerance of                            previous anesthesia was also reviewed. The risks                            and benefits of the procedure and the sedation                            options and risks were discussed with the patient.                            All questions were answered, and informed consent                            was obtained. Prior Anticoagulants: The patient has                            taken no anticoagulant or antiplatelet agents. ASA                            Grade Assessment: III - A patient with severe                            systemic disease. After reviewing the risks and                            benefits, the patient was deemed in satisfactory                            condition to undergo the procedure.                           After obtaining informed consent, the endoscope was  passed under direct vision. Throughout the                            procedure, the patient's blood pressure, pulse, and                            oxygen saturations were monitored continuously. The                            GIF [id number] #[id number] was introduced through the                            mouth, and advanced to the second part of duodenum.                            The upper GI  endoscopy was accomplished without                            difficulty. The patient tolerated the procedure                            well. Scope In: Scope Out: Findings:                 The esophagus was normal.                           A small hiatal hernia was present.                           The gastroesophageal flap valve was visualized                            endoscopically and classified as Hill Grade II                            (fold present, opens with respiration).                           The stomach was otherwise normal. Biopsies were                            taken with a cold forceps for histology.                           The examined duodenum was normal. Biopsies for                            histology were taken with a cold forceps for                            evaluation of celiac disease.                           The cardia and gastric fundus were normal on  retroflexion. Complications:            No immediate complications. Estimated Blood Loss:     Estimated blood loss: none. Impression:               - Normal esophagus.                           - Small hiatal hernia.                           - Gastroesophageal flap valve classified as Hill                            Grade II (fold present, opens with respiration).                           - Otherwise normal stomach. Biopsied.                           - Normal examined duodenum. Biopsied. Recommendation:           - Patient has a contact number available for                            emergencies. The signs and symptoms of potential                            delayed complications were discussed with the                            patient. Return to normal activities tomorrow.                            Written discharge instructions were provided to the                            patient.                           - Resume previous diet.                           -  Continue present medications.                           - Await pathology results. Meryl Dare, MD 01/22/2023 10:16:10 AM This report has been signed electronically.

## 2023-01-22 NOTE — Progress Notes (Signed)
Uneventful anesthetic. Report to pacu rn. Vss. Care resumed by rn. 

## 2023-01-23 ENCOUNTER — Telehealth: Payer: Self-pay

## 2023-01-23 NOTE — Telephone Encounter (Signed)
  Follow up Call-     01/22/2023    9:18 AM 07/16/2020    7:12 AM  Call back number  Post procedure Call Back phone  # [phone]  Permission to leave phone message Yes Yes     Patient questions:  Do you have a fever, pain , or abdominal swelling? No. Pain Score  0 *  Have you tolerated food without any problems? Yes.    Have you been able to return to your normal activities? Yes.    Do you have any questions about your discharge instructions: Diet   No. Medications  No. Follow up visit  No.  Do you have questions or concerns about your Care? No.  Actions: * If pain score is 4 or above: No action needed, pain <4.

## 2023-01-25 LAB — SURGICAL PATHOLOGY

## 2023-01-29 ENCOUNTER — Other Ambulatory Visit: Payer: Self-pay | Admitting: Family Medicine

## 2023-01-29 DIAGNOSIS — Z1231 Encounter for screening mammogram for malignant neoplasm of breast: Secondary | ICD-10-CM

## 2023-02-01 ENCOUNTER — Ambulatory Visit: Payer: PPO | Admitting: Family Medicine

## 2023-02-01 ENCOUNTER — Encounter: Payer: Self-pay | Admitting: Family Medicine

## 2023-02-01 VITALS — BP 160/72 | HR 65 | Temp 98.2°F | Ht 59.0 in | Wt 154.0 lb

## 2023-02-01 DIAGNOSIS — K581 Irritable bowel syndrome with constipation: Secondary | ICD-10-CM

## 2023-02-01 DIAGNOSIS — G6289 Other specified polyneuropathies: Secondary | ICD-10-CM | POA: Diagnosis not present

## 2023-02-01 MED ORDER — ONDANSETRON HCL 4 MG PO TABS
4.0000 mg | ORAL_TABLET | Freq: Three times a day (TID) | ORAL | 5 refills | Status: AC | PRN
Start: 2023-02-01 — End: ?

## 2023-02-01 MED ORDER — AMITRIPTYLINE HCL 10 MG PO TABS: 10.0000 mg | ORAL_TABLET | Freq: Every day | ORAL | 0 refills | Status: DC

## 2023-02-01 NOTE — Progress Notes (Unsigned)
Established Patient Office Visit  Subjective   Patient ID: Robin [name], female    DOB: 30-Apr-1934  Age: 89 y.o. MRN: [id number]  Chief Complaint  Patient presents with   Results    Patient states she had a endoscopy by Dr Russella Dar who advised she follow up with PCP for a prescription of  Amitriptyline    Ear Pain    Recurrent ear pain and questionable cerumen impaction, states she used OTC drops with no return    Pt is here for follow up of her chronic nausea and her neuropathy symptoms. She had an endoscopy which was normal. Patient states she doesn't know why she continues to have this chronic nausea. State sthat when she wakes up she is not nauseated, only when she takes her medication does the nausea start. She has had a thorough work up. We again talked about her nausea being caused by a medication, however pt is limited on what she can take for her blood pressure. I advised instead that we focus on controlling her symptoms rather than findings a reason for the nausea, especially if the risk of coming off her medications outweighs the benefits.   Pt would also like me to check her ears to make sure they are no longer clogged with wax.      Current Outpatient Medications  Medication Instructions   acetaminophen (TYLENOL) 500-1,000 mg, Oral, Every 6 hours PRN   AMBULATORY NON FORMULARY MEDICATION Medication Name: GI Cocktail - 90 ml 2% Lidocaine, 90 ml Dicyclomine 10mg /68ml, 270 ml Maalox - Take 5-10 ml every 6 hour as needed.   amitriptyline (ELAVIL) 10 mg, Oral, Daily at bedtime   carvedilol (COREG) 3.125 MG tablet TAKE 1 TABLET BY MOUTH TWICE DAILY WITH A MEAL   cetirizine (ZYRTEC) 10 mg, Oral, Daily   Cholecalciferol 1,000 Units, Oral, Daily   clobetasol ointment (TEMOVATE) 0.05 % Apply to affected area twice weekly.   esomeprazole (NEXIUM) 40 mg, Oral, 2 times daily before meals   ezetimibe (ZETIA) 10 MG tablet TAKE 1 TABLET BY MOUTH DAILY   furosemide (LASIX) 20 MG tablet 1  tablet by mouth every other day as needed for swelling   hydrALAZINE (APRESOLINE) 12.5 mg, Oral, 2 times daily   magnesium oxide (MAG-OX) 400 mg, Oral, Daily   ondansetron (ZOFRAN) 4 mg, Oral, Every 8 hours PRN   rosuvastatin (CRESTOR) 2.5 mg, Oral, Daily   spironolactone (ALDACTONE) 25 mg, Oral, 2 times daily    Patient Active Problem List   Diagnosis Date Noted   HYPERCHOLESTEROLEMIA 01/05/2023   Peripheral neuropathy 11/30/2022   Osteoporosis of femur without pathological fracture 06/24/2019   Dyspnea 02/03/2019   Asthma 02/03/2019   Vertigo 06/26/2017   Hyperparathyroidism (HCC) 08/24/2014   Slow transit constipation 01/13/2014   IBS (irritable bowel syndrome), GERD, hx colon polyps, chronic abd pain - followed by Dr. Jarold Motto in GI 06/11/2012   Osteopenia 08/29/2011   Vitamin D deficiency 08/29/2011   GERD (gastroesophageal reflux disease) 11/23/2010   Dyslipidemia 09/09/2007   Hypertension, essential, benign 09/09/2007      Review of Systems  All other systems reviewed and are negative.     Objective:     BP (!) 160/72 (BP Location: Right Arm, Patient Position: Sitting, Cuff Size: Large)   Pulse 65   Temp 98.2 F (36.8 C) (Oral)   Ht 4\' 11"  (1.499 m)   Wt 154 lb (69.9 kg)   SpO2 97%   BMI 31.10 kg/m  {Vitals History (  Optional):23777}  Physical Exam Vitals reviewed.  Constitutional:      Appearance: Normal appearance. She is normal weight.  HENT:     Right Ear: Tympanic membrane normal.     Left Ear: Tympanic membrane normal.     Mouth/Throat:     Mouth: Mucous membranes are moist.     Pharynx: No posterior oropharyngeal erythema.  Eyes:     Conjunctiva/sclera: Conjunctivae normal.  Pulmonary:     Effort: Pulmonary effort is normal.  Abdominal:     General: Bowel sounds are normal.     Palpations: Abdomen is soft.  Musculoskeletal:     Right lower leg: No edema.     Left lower leg: No edema.  Neurological:     Mental Status: She is alert and  oriented to person, place, and time. Mental status is at baseline.      No results found for any visits on 02/01/23.  {Labs (Optional):23779}  The ASCVD Risk score (Arnett DK, et al., 2019) failed to calculate for the following reasons:   The 2019 ASCVD risk score is only valid for ages 18 to 39    Assessment & Plan:  Other polyneuropathy -     Amitriptyline HCl; Take 1 tablet (10 mg total) by mouth at bedtime.  Dispense: 30 tablet; Refill: 0  Irritable bowel syndrome with constipation -     Ondansetron HCl; Take 1 tablet (4 mg total) by mouth every 8 (eight) hours as needed for nausea or vomiting.  Dispense: 30 tablet; Refill: 5     No follow-ups on file.    Karie Georges, MD

## 2023-02-03 NOTE — Progress Notes (Unsigned)
Cardiology Clinic Note   Patient Name: Robin [name] Date of Encounter: 02/05/2023  Primary Care Provider:  Karie Georges, MD Primary Cardiologist:  Olga Millers, MD  Patient Profile    89 year old female following for ongoing assessment and management of dizziness, hypertension, echocardiogram revealed normal LV systolic function with mild mitral regurgitation mild aortic stenosis with mean gradient 10 mmHg.  Last seen by Dr. Jens Som March 2024 blood pressure is well-controlled, she was to continue statin and is due for repeat echocardiogram to evaluate for aortic valve stenosis surveillance.  Past Medical History    Past Medical History:  Diagnosis Date   Abnormal Pap smear of cervix    Allergy    Arthritis    Asthma    Cataract 2018   bilateral eyes   Colon polyps    tubulovillous adenoma, 2010   External hemorrhoids without mention of complication    GERD (gastroesophageal reflux disease)    Headache    eval with neuro in 2011   Hyperlipemia    Hyperparathyroidism (HCC)    seeing Dr. Lucianne Muss in endocrinology   Hypertension    IBS (irritable bowel syndrome), GERD, hx colon polyps, chronic abd pain - followed by Dr. Jarold Motto in GI 06/11/2012   Osteoporosis 08/29/2011   Vertigo    eval with neuro in 2011, brief recurrence 2016   Past Surgical History:  Procedure Laterality Date   BACK SURGERY  1974   CARPAL TUNNEL RELEASE Left 06/2008   COLONOSCOPY     EYE SURGERY  07/2017   bilateral cataract extraction   POLYPECTOMY     TONSILLECTOMY     UPPER GASTROINTESTINAL ENDOSCOPY      Allergies  Allergies  Allergen Reactions   Prilosec [Omeprazole] Cough   Amlodipine     Feet and ankle swelling   Atenolol     Did not help lower bp   Bystolic [Nebivolol Hcl]     Severe stomach pain   Chlorthalidone     Cramps, low back pain and itchy scalp   Clonidine Derivatives     fainting   Losartan     Did not lower bp   Protonix [Pantoprazole Sodium] Swelling    Amoxicillin Rash   Flagyl [Metronidazole] Rash    History of Present Illness    Robin [name] is a very pleasant lady who returns to the office today for ongoing assessment and management of hyperlipidemia, hypertension, and aortic valve stenosis.  She is due to have a repeat echocardiogram ordered on this office visit.  She is here today with her daughter and denies any cardiac complaints.  She has no chest pain, palpitations, dyspnea on exertion, dizziness or near syncope.  She is not very active.  She walks in her home is able to do her own housework and take care of herself.  She is medically compliant.  She has had labs through her primary care provider.   Home Medications    Current Outpatient Medications  Medication Sig Dispense Refill   acetaminophen (TYLENOL) 500 MG tablet Take 500-1,000 mg by mouth every 6 (six) hours as needed for mild pain or headache.      AMBULATORY NON FORMULARY MEDICATION Medication Name: GI Cocktail - 90 ml 2% Lidocaine, 90 ml Dicyclomine 10mg /47ml, 270 ml Maalox - Take 5-10 ml every 6 hour as needed. 450 mL 3   amitriptyline (ELAVIL) 10 MG tablet Take 1 tablet (10 mg total) by mouth at bedtime. 30 tablet 0   carvedilol (  COREG) 3.125 MG tablet TAKE 1 TABLET BY MOUTH TWICE DAILY WITH A MEAL 180 tablet 3   cetirizine (ZYRTEC) 10 MG tablet Take 10 mg by mouth daily.     Cholecalciferol 25 MCG (1000 UT) TBDP Take 1,000 Units by mouth daily. 90 tablet 3   clobetasol ointment (TEMOVATE) 0.05 % Apply to affected area twice weekly. 60 g 4   esomeprazole (NEXIUM) 40 MG capsule Take 1 capsule (40 mg total) by mouth 2 (two) times daily before a meal. 180 capsule 3   ezetimibe (ZETIA) 10 MG tablet TAKE 1 TABLET BY MOUTH DAILY 90 tablet 1   furosemide (LASIX) 20 MG tablet 1 tablet by mouth every other day as needed for swelling 45 tablet 3   hydrALAZINE (APRESOLINE) 25 MG tablet Take 0.5 tablets (12.5 mg total) by mouth 2 (two) times daily. 90 tablet 3   magnesium oxide  (MAG-OX) 400 (240 Mg) MG tablet Take 400 mg by mouth daily.     ondansetron (ZOFRAN) 4 MG tablet Take 1 tablet (4 mg total) by mouth every 8 (eight) hours as needed for nausea or vomiting. 30 tablet 5   rosuvastatin (CRESTOR) 5 MG tablet Take 0.5 tablets (2.5 mg total) by mouth daily. 45 tablet 1   spironolactone (ALDACTONE) 25 MG tablet Take 1 tablet (25 mg total) by mouth 2 (two) times daily. 180 tablet 3   Current Facility-Administered Medications  Medication Dose Route Frequency Provider Last Rate Last Admin   0.9 %  sodium chloride infusion  500 mL Intravenous Once Meryl Dare, MD         Family History    Family History  Problem Relation Age of Onset   CAD Mother        Died of MI at age 28   Hypertension Mother    Hypertension Father    Stroke Father    Diabetes Sister    Hypertension Sister    Heart disease Sister        heart failure   Stroke Sister 96   Hypertension Sister    Diabetes Brother    Hypertension Brother    Stroke Brother 78   CAD Brother        Died of MI at age 58   Hypertension Brother    Stroke Brother 60   Hypertension Brother    Breast cancer Niece    Colon cancer Neg Hx    Colon polyps Neg Hx    Esophageal cancer Neg Hx    Pancreatic cancer Neg Hx    Stomach cancer Neg Hx    Rectal cancer Neg Hx    She indicated that her mother is deceased. She indicated that her father is deceased. She indicated that both of her sisters are deceased. She indicated that all of her three brothers are deceased. She indicated that her maternal grandmother is deceased. She indicated that her maternal grandfather is deceased. She indicated that her paternal grandmother is deceased. She indicated that her paternal grandfather is deceased. She indicated that the status of her neg hx is unknown. She indicated that her niece is alive.  Social History    Social History   Socioeconomic History   Marital status: Widowed    Spouse name: Not on file   Number of  children: 2   Years of education: Not on file   Highest education level: Not on file  Occupational History   Occupation: Engineer, structural    Comment: retired  Employer: RETIRED  Tobacco Use   Smoking status: Never   Smokeless tobacco: Never  Vaping Use   Vaping status: Never Used  Substance and Sexual Activity   Alcohol use: No    Alcohol/week: 0.0 standard drinks of alcohol   Drug use: No   Sexual activity: Not Currently    Birth control/protection: Post-menopausal  Other Topics Concern   Not on file  Social History Narrative   Lives alone in a one story home.  Has 2 children, one son and one daughter with 2 grandkids and 1 great-grandchild.    Retired from working for a bank.     Education: high school.    Social Determinants of Health   Financial Resource Strain: Low Risk  (01/05/2023)   Overall Financial Resource Strain (CARDIA)    Difficulty of Paying Living Expenses: Not hard at all  Food Insecurity: No Food Insecurity (01/05/2023)   Hunger Vital Sign    Worried About Running Out of Food in the Last Year: Never true    Ran Out of Food in the Last Year: Never true  Transportation Needs: No Transportation Needs (01/05/2023)   PRAPARE - Administrator, Civil Service (Medical): No    Lack of Transportation (Non-Medical): No  Physical Activity: Insufficiently Active (01/05/2023)   Exercise Vital Sign    Days of Exercise per Week: 3 days    Minutes of Exercise per Session: 20 min  Stress: No Stress Concern Present (01/05/2023)   [name] of Occupational Health - Occupational Stress Questionnaire    Feeling of Stress : Not at all  Social Connections: Socially Isolated (01/05/2023)   Social Connection and Isolation Panel [NHANES]    Frequency of Communication with Friends and Family: More than three times a week    Frequency of Social Gatherings with Friends and Family: More than three times a week    Attends Religious Services: Never    Doctor, general practice or Organizations: No    Attends Banker Meetings: Never    Marital Status: Widowed  Intimate Partner Violence: Not At Risk (01/05/2023)   Humiliation, Afraid, Rape, and Kick questionnaire    Fear of Current or Ex-Partner: No    Emotionally Abused: No    Physically Abused: No    Sexually Abused: No     Review of Systems    General:  No chills, fever, night sweats or weight changes.  Cardiovascular:  No chest pain, dyspnea on exertion, edema, orthopnea, palpitations, paroxysmal nocturnal dyspnea. Dermatological: No rash, lesions/masses Respiratory: No cough, dyspnea Urologic: No hematuria, dysuria Abdominal:   No nausea, vomiting, diarrhea, bright red blood per rectum, melena, or hematemesis Neurologic:  No visual changes, wkns, changes in mental status. All other systems reviewed and are otherwise negative except as noted above.       Physical Exam    VS:  BP (!) 146/76 (BP Location: Left Arm, Patient Position: Sitting, Cuff Size: Normal)   Pulse 65   Ht 4\' 11"  (1.499 m)   Wt 154 lb 12.8 oz (70.2 kg)   SpO2 98%   BMI 31.27 kg/m  , BMI Body mass index is 31.27 kg/m.     GEN: Well nourished, well developed, in no acute distress. HEENT: normal. Neck: Supple, no JVD, positive left carotid bruit, none on the right, no masses. Cardiac: RRR, 2/6 systolic murmur, heard best at the right sternal border, rubs, or gallops. No clubbing, cyanosis, edema.  Radials/DP/PT 2+ and equal bilaterally.  Respiratory:  Respirations regular and unlabored, clear to auscultation bilaterally. GI: Soft, nontender, nondistended, BS + x 4. MS: no deformity or atrophy.  Unsteady on her feet when climbing onto exam table. Skin: warm and dry, no rash. Neuro:  Strength and sensation are intact. Psych: Normal affect.      Lab Results  Component Value Date   WBC 8.4 06/22/2022   HGB 13.0 06/22/2022   HCT 38.1 06/22/2022   MCV 92.7 06/22/2022   PLT 345.0 06/22/2022   Lab Results   Component Value Date   CREATININE 0.87 06/22/2022   BUN 18 06/22/2022   NA 141 06/22/2022   K 4.0 06/22/2022   CL 104 06/22/2022   CO2 25 06/22/2022   Lab Results  Component Value Date   ALT 8 06/22/2022   AST 13 06/22/2022   ALKPHOS 75 06/22/2022   BILITOT 0.4 06/22/2022   Lab Results  Component Value Date   CHOL 166 10/03/2021   HDL 59.50 10/03/2021   LDLCALC 87 10/03/2021   TRIG 94.0 10/03/2021   CHOLHDL 3 10/03/2021    Lab Results  Component Value Date   HGBA1C 5.5 09/30/2013     Review of Prior Studies    Echocardiogram 08/16/2020 1. Left ventricular ejection fraction, by estimation, is 60 to 65%. The  left ventricle has normal function. The left ventricle has no regional  wall motion abnormalities. Left ventricular diastolic parameters are  indeterminate.   2. Right ventricular systolic function is normal. The right ventricular  size is normal. There is normal pulmonary artery systolic pressure.   3. The mitral valve is normal in structure. Mild mitral valve  regurgitation.   4. The aortic valve is tricuspid. Aortic valve regurgitation is not  visualized.    Assessment & Plan   1.  Aortic valve stenosis: Echocardiogram is ordered today for ongoing surveillance.  She is currently asymptomatic concerning chest pain, dizziness, or shortness of breath.  She remains on Lasix.  She is not very active.  She will continue current medication regimen as directed.  She will follow-up with Dr. Jens Som in 6 months.  The patient is requesting to be seen by Dr. Jens Som only.  2.  Hypertension: Blood pressure slightly elevated today but much better controlled compared to prior blood pressure.  Continue carvedilol 3.125 mg twice daily hydralazine 12.5 mg twice a day and spironolactone 25 mg daily.  Most recent labs completed February 2024 potassium 4.0 creatinine 0.87.  3.  Hypercholesterolemia: She remains on rosuvastatin 5 mg daily and Zetia 10 mg daily.  Lipid panel has not  been completed recently.  Would recommend repeating fasting lipids on follow-up appointment unless completed by PCP prior to that time.         Signed, Bettey Mare. Liborio Nixon, ANP, AACC   02/05/2023 11:43 AM      Office [phone] Fax [phone]  Notice: This dictation was prepared with Dragon dictation along with smaller phrase technology. Any transcriptional errors that result from this process are unintentional and may not be corrected upon review.

## 2023-02-05 ENCOUNTER — Ambulatory Visit: Payer: PPO | Attending: Adult Health | Admitting: Adult Health

## 2023-02-05 ENCOUNTER — Encounter: Payer: Self-pay | Admitting: Adult Health

## 2023-02-05 VITALS — BP 146/76 | HR 65 | Ht 59.0 in | Wt 154.8 lb

## 2023-02-05 DIAGNOSIS — I350 Nonrheumatic aortic (valve) stenosis: Secondary | ICD-10-CM

## 2023-02-05 DIAGNOSIS — I10 Essential (primary) hypertension: Secondary | ICD-10-CM

## 2023-02-05 DIAGNOSIS — E7800 Pure hypercholesterolemia, unspecified: Secondary | ICD-10-CM | POA: Diagnosis not present

## 2023-02-05 NOTE — Patient Instructions (Signed)
Medication Instructions:  No Changes *If you need a refill on your cardiac medications before your next appointment, please call your pharmacy*   Lab Work: No Labs If you have labs (blood work) drawn today and your tests are completely normal, you will receive your results only by: MyChart Message (if you have MyChart) OR A paper copy in the mail If you have any lab test that is abnormal or we need to change your treatment, we will call you to review the results.   Testing/Procedures: [address]. Your physician has requested that you have an echocardiogram. Echocardiography is a painless test that uses sound waves to create images of your heart. It provides your doctor with information about the size and shape of your heart and how well your heart's chambers and valves are working. This procedure takes approximately one hour. There are no restrictions for this procedure. Please do NOT wear cologne, perfume, aftershave, or lotions (deodorant is allowed). Please arrive 15 minutes prior to your appointment time.    Follow-Up: At [facility], you and your health needs are our priority.  As part of our continuing mission to provide you with exceptional heart care, we have created designated Provider Care Teams.  These Care Teams include your primary Cardiologist (physician) and Advanced Practice Providers (APPs -  Physician Assistants and Nurse Practitioners) who all work together to provide you with the care you need, when you need it.  We recommend signing up for the patient portal called "MyChart".  Sign up information is provided on this After Visit Summary.  MyChart is used to connect with patients for Virtual Visits (Telemedicine).  Patients are able to view lab/test results, encounter notes, upcoming appointments, etc.  Non-urgent messages can be sent to your provider as well.   To learn more about what you can do with MyChart, go to  [email or web address].    Your next appointment:   April 2025  Provider:   Olga Millers, MD

## 2023-02-08 ENCOUNTER — Encounter: Payer: Self-pay | Admitting: Gastroenterology

## 2023-02-08 NOTE — Assessment & Plan Note (Signed)
Continued gut issues/ chronic nausea, will refill ondansetron 4 mg as needed for nausea. It is unclear what is causing her symptoms, she has had multiple tests and it has not revealed the cause. There is some concern that it is one of her medications that is causing a side effect of nausea. However pt is not able to come off any of her medications at this time. Will continue ondansetron as needed

## 2023-02-08 NOTE — Assessment & Plan Note (Signed)
Other medications previously tried did not work for her, the GI doctor suggested starting the amitriptyline. Risks/benefits were discussed with the patient, we will start a very low dose and monitor her reaction/ assess for signs of improvement.

## 2023-02-27 ENCOUNTER — Ambulatory Visit (HOSPITAL_COMMUNITY): Payer: PPO | Attending: Cardiovascular Disease

## 2023-02-27 DIAGNOSIS — I350 Nonrheumatic aortic (valve) stenosis: Secondary | ICD-10-CM | POA: Diagnosis not present

## 2023-02-27 LAB — ECHOCARDIOGRAM COMPLETE
AR max vel: 1.56 cm2
AV Area VTI: 1.5 cm2
AV Area mean vel: 1.49 cm2
AV Mean grad: 9 mm[Hg]
AV Peak grad: 17.1 mm[Hg]
Ao pk vel: 2.07 m/s
Area-P 1/2: 3.45 cm2
P 1/2 time: 898 ms
S' Lateral: 2.8 cm

## 2023-03-02 ENCOUNTER — Telehealth: Payer: Self-pay

## 2023-03-02 NOTE — Telephone Encounter (Addendum)
Called patient regarding results. Patient had understanding of results.----- Message from Joni Reining sent at 03/01/2023  7:18 AM EDT ----- I have reviewed the echo report. Heart is pumping well, some enlargement of the left ventricle with stiffening due to aging. Keeping BP under control with be helpful  No changes in regimen

## 2023-03-09 ENCOUNTER — Ambulatory Visit (INDEPENDENT_AMBULATORY_CARE_PROVIDER_SITE_OTHER): Payer: PPO

## 2023-03-09 DIAGNOSIS — Z23 Encounter for immunization: Secondary | ICD-10-CM | POA: Diagnosis not present

## 2023-03-12 ENCOUNTER — Ambulatory Visit: Payer: PPO

## 2023-03-19 ENCOUNTER — Ambulatory Visit
Admission: RE | Admit: 2023-03-19 | Discharge: 2023-03-19 | Disposition: A | Discharge status: Home / Self Care | Payer: PPO | Source: Ambulatory Visit | Attending: Family Medicine | Admitting: Family Medicine

## 2023-03-19 DIAGNOSIS — Z1231 Encounter for screening mammogram for malignant neoplasm of breast: Secondary | ICD-10-CM | POA: Diagnosis not present

## 2023-03-22 ENCOUNTER — Other Ambulatory Visit: Payer: Self-pay

## 2023-03-22 DIAGNOSIS — L900 Lichen sclerosus et atrophicus: Secondary | ICD-10-CM

## 2023-03-22 MED ORDER — CLOBETASOL PROPIONATE 0.05 % EX OINT
TOPICAL_OINTMENT | CUTANEOUS | 0 refills | Status: DC
Start: 2023-03-22 — End: 2023-07-31

## 2023-03-22 NOTE — Telephone Encounter (Signed)
Med refill request: clobetasol propionate 0.05% Last OV: 07/29/20 Dr. Seymour Bars Last AEX: none Next AEX: none  Last MMG (if hormonal med) n/a Refill sent to provider for approval or denial.

## 2023-04-20 ENCOUNTER — Emergency Department (HOSPITAL_BASED_OUTPATIENT_CLINIC_OR_DEPARTMENT_OTHER): Payer: PPO

## 2023-04-20 ENCOUNTER — Emergency Department (HOSPITAL_BASED_OUTPATIENT_CLINIC_OR_DEPARTMENT_OTHER)
Admission: EM | Admit: 2023-04-20 | Discharge: 2023-04-20 | Disposition: A | Discharge status: Home / Self Care | Payer: PPO | Attending: Emergency Medicine | Admitting: Emergency Medicine

## 2023-04-20 ENCOUNTER — Other Ambulatory Visit: Payer: Self-pay

## 2023-04-20 ENCOUNTER — Ambulatory Visit
Admission: EM | Admit: 2023-04-20 | Discharge: 2023-04-20 | Disposition: A | Discharge status: Home / Self Care | Payer: PPO

## 2023-04-20 DIAGNOSIS — I6782 Cerebral ischemia: Secondary | ICD-10-CM | POA: Diagnosis not present

## 2023-04-20 DIAGNOSIS — R1030 Lower abdominal pain, unspecified: Secondary | ICD-10-CM | POA: Insufficient documentation

## 2023-04-20 DIAGNOSIS — R197 Diarrhea, unspecified: Secondary | ICD-10-CM | POA: Diagnosis not present

## 2023-04-20 DIAGNOSIS — R519 Headache, unspecified: Secondary | ICD-10-CM | POA: Diagnosis not present

## 2023-04-20 DIAGNOSIS — K449 Diaphragmatic hernia without obstruction or gangrene: Secondary | ICD-10-CM | POA: Diagnosis not present

## 2023-04-20 DIAGNOSIS — K429 Umbilical hernia without obstruction or gangrene: Secondary | ICD-10-CM | POA: Diagnosis not present

## 2023-04-20 DIAGNOSIS — R1033 Periumbilical pain: Secondary | ICD-10-CM

## 2023-04-20 DIAGNOSIS — K5730 Diverticulosis of large intestine without perforation or abscess without bleeding: Secondary | ICD-10-CM | POA: Diagnosis not present

## 2023-04-20 DIAGNOSIS — I10 Essential (primary) hypertension: Secondary | ICD-10-CM | POA: Diagnosis not present

## 2023-04-20 DIAGNOSIS — R030 Elevated blood-pressure reading, without diagnosis of hypertension: Secondary | ICD-10-CM

## 2023-04-20 DIAGNOSIS — N200 Calculus of kidney: Secondary | ICD-10-CM | POA: Diagnosis not present

## 2023-04-20 LAB — COMPREHENSIVE METABOLIC PANEL
ALT: 11 U/L (ref 0–44)
AST: 18 U/L (ref 15–41)
Albumin: 4.6 g/dL (ref 3.5–5.0)
Alkaline Phosphatase: 74 U/L (ref 38–126)
Anion gap: 7 (ref 5–15)
BUN: 17 mg/dL (ref 8–23)
CO2: 29 mmol/L (ref 22–32)
Calcium: 11.6 mg/dL — ABNORMAL HIGH (ref 8.9–10.3)
Chloride: 103 mmol/L (ref 98–111)
Creatinine, Ser: 0.87 mg/dL (ref 0.44–1.00)
GFR, Estimated: >60 mL/min (ref >60)
Glucose, Bld: 89 mg/dL (ref 70–99)
Potassium: 3.9 mmol/L (ref 3.5–5.1)
Sodium: 139 mmol/L (ref 135–145)
Total Bilirubin: 0.6 mg/dL (ref <1.2)
Total Protein: 7.2 g/dL (ref 6.5–8.1)

## 2023-04-20 LAB — URINALYSIS, ROUTINE W REFLEX MICROSCOPIC
Bacteria, UA: NONE SEEN
Bilirubin Urine: NEGATIVE
Glucose, UA: NEGATIVE mg/dL
Hgb urine dipstick: NEGATIVE
Ketones, ur: 40 mg/dL — AB
Nitrite: NEGATIVE
Protein, ur: NEGATIVE mg/dL
Specific Gravity, Urine: 1.027 (ref 1.005–1.030)
pH: 6 (ref 5.0–8.0)

## 2023-04-20 LAB — CBC
HCT: 38.3 % (ref 36.0–46.0)
Hemoglobin: 13 g/dL (ref 12.0–15.0)
MCH: 31.7 pg (ref 26.0–34.0)
MCHC: 33.9 g/dL (ref 30.0–36.0)
MCV: 93.4 fL (ref 80.0–100.0)
Platelets: 312 10*3/uL (ref 150–400)
RBC: 4.1 MIL/uL (ref 3.87–5.11)
RDW: 12.8 % (ref 11.5–15.5)
WBC: 7.7 10*3/uL (ref 4.0–10.5)
nRBC: 0 % (ref 0.0–0.2)

## 2023-04-20 LAB — LIPASE, BLOOD: Lipase: 21 U/L (ref 11–51)

## 2023-04-20 LAB — POCT FASTING CBG KUC MANUAL ENTRY: POCT Glucose (KUC): 94 mg/dL (ref 70–99)

## 2023-04-20 MED ORDER — HYDRALAZINE HCL 25 MG PO TABS
12.5000 mg | ORAL_TABLET | Freq: Once | ORAL | Status: AC
  Administered 2023-04-20: 12.5 mg via ORAL

## 2023-04-20 MED ORDER — IOHEXOL 300 MG/ML  SOLN
80.0000 mL | Freq: Once | INTRAMUSCULAR | Status: AC | PRN
  Administered 2023-04-20: 100 mL via INTRAVENOUS

## 2023-04-20 MED ORDER — LOPERAMIDE HCL 2 MG PO CAPS: 2.0000 mg | ORAL_CAPSULE | Freq: Four times a day (QID) | ORAL | 0 refills | Status: AC | PRN

## 2023-04-20 MED ORDER — MORPHINE SULFATE (PF) 4 MG/ML IV SOLN
4.0000 mg | Freq: Once | INTRAVENOUS | Status: AC
  Administered 2023-04-20: 4 mg via INTRAVENOUS
  Filled 2023-04-20: qty 1

## 2023-04-20 MED ORDER — METOCLOPRAMIDE HCL 5 MG/ML IJ SOLN
5.0000 mg | Freq: Once | INTRAMUSCULAR | Status: AC
  Administered 2023-04-20: 5 mg via INTRAVENOUS
  Filled 2023-04-20: qty 2

## 2023-04-20 MED ORDER — SODIUM CHLORIDE 0.9 % IV BOLUS
1000.0000 mL | Freq: Once | INTRAVENOUS | Status: AC
  Administered 2023-04-20: 1000 mL via INTRAVENOUS

## 2023-04-20 NOTE — Discharge Instructions (Signed)
Please follow-up with your primary doctor.  Return immediately if develop fevers, chills, chest pain, shortness of breath, worsening headache, facial droop, unilateral weakness, inability to find words, lightheadedness passout, worsening abdominal pain or any new or worsening symptoms that are concerning to you.

## 2023-04-20 NOTE — ED Notes (Signed)
Assisted patient to car (per request).  Yancey Flemings CMA

## 2023-04-20 NOTE — ED Triage Notes (Signed)
Pt reports dizziness, EKG performed, denies chest pain, reports some SOB. States she does have prn lasix. Has not had to take it recently.

## 2023-04-20 NOTE — ED Triage Notes (Signed)
"  About a week ago today started with bad diarrhea, I have had this on/off all week". "During the night work up with severe headache". "Last loose stool, Tuesday evening". No fever. No nausea or vomiting. PO's "less, all I have had is toast in the last 3 days". BP at home "yesterday 170 over something". "Last void just before I came here".

## 2023-04-20 NOTE — Discharge Instructions (Signed)
Discussed with patient she will need to have lab lab work and further evaluation and CT scan completed.  Discussed with patient she will need to be seen in the emergency room patient states that she lives 5 minutes away she feels fine and wants to drive herself home for her daughter to take her to the hospital patient did not want to have ambulance transport.  Patient wants to go to [facility] and understands she may need to be transported for long-term care at [facility].  Patient understands plan of care

## 2023-04-20 NOTE — ED Notes (Signed)
Patient is being discharged from the Urgent Care and sent to the Emergency Department via Private Vehicle. Discussed with provider. Patient lives closeby (within minutes) so will driver home and family will take her to ED . Per Provider, patient is in need of higher level of care due to Dizziness. Patient is aware and verbalizes understanding of plan of care.  Vitals:   04/20/23 1025 04/20/23 1035  BP: (S) (!) 210/98 (!) 188/83  Pulse: 80   Resp: 18   Temp: 97.9 F (36.6 C)   SpO2: 97%

## 2023-04-20 NOTE — ED Notes (Signed)
Family updated as to patient's status (by patient), By phone.

## 2023-04-20 NOTE — ED Provider Notes (Signed)
[place] EMERGENCY DEPARTMENT AT [facility] Provider Note   CSN: [id number] Arrival date & time: 04/20/23  1212     History  Chief Complaint  Patient presents with   Abdominal Pain    Robin [name] is a 89 y.o. female.  89 yo F with a chief complaints of lower colicky abdominal pain.  This has been going on for the better part of a week.  She went to urgent care today and they were worried about her blood pressure being elevated and sent her to the ED for evaluation.  On review of systems she does tell me that she developed a headache suddenly last night.  She said she gets headaches but does not think she gets them frequently.  It does feel similar to headache she has had in the past.  She has been transiently dizzy but not dizzy currently.  She has trouble describing what that means.  She denies suspicious food intake denies dark or bloody stool.  Denies history of prior abdominal surgery.  She tells me she has had significant issue with her abdomen for years.   Abdominal Pain      Home Medications Prior to Admission medications   Medication Sig Start Date End Date Taking? Authorizing Provider  acetaminophen (TYLENOL) 500 MG tablet Take 500-1,000 mg by mouth every 6 (six) hours as needed for mild pain or headache.     [provider]  AMBULATORY NON FORMULARY MEDICATION Medication Name: GI Cocktail - 90 ml 2% Lidocaine, 90 ml Dicyclomine 10mg /48ml, 270 ml Maalox - Take 5-10 ml every 6 hour as needed. 10/21/21   Unk Lightning, PA  amitriptyline (ELAVIL) 10 MG tablet Take 1 tablet (10 mg total) by mouth at bedtime. 02/01/23   Karie Georges, MD  baclofen (LIORESAL) 10 MG tablet Take 5 mg by mouth 2 (two) times daily as needed. 11/08/22   [provider]  carvedilol (COREG) 3.125 MG tablet TAKE 1 TABLET BY MOUTH TWICE DAILY WITH A MEAL 12/12/22   Lewayne Bunting, MD  cetirizine (ZYRTEC) 10 MG tablet Take 10 mg by mouth daily.    [provider]  Cholecalciferol 25 MCG (1000 UT) TBDP Take 1,000 Units by mouth daily. 11/18/18   Romero Belling, MD  clobetasol ointment (TEMOVATE) 0.05 % Apply to affected area twice weekly. 03/22/23   Chrzanowski, Clearnce Hasten B, NP  esomeprazole (NEXIUM) 40 MG capsule Take 1 capsule (40 mg total) by mouth 2 (two) times daily before a meal. 07/03/22   Karie Georges, MD  ezetimibe (ZETIA) 10 MG tablet TAKE 1 TABLET BY MOUTH DAILY 01/01/23   Karie Georges, MD  furosemide (LASIX) 20 MG tablet 1 tablet by mouth every other day as needed for swelling 07/19/20   Lewayne Bunting, MD  hydrALAZINE (APRESOLINE) 25 MG tablet Take 0.5 tablets (12.5 mg total) by mouth 2 (two) times daily. 07/03/22   Karie Georges, MD  lidocaine (XYLOCAINE) 5 % ointment Apply 1 Application topically as directed. 12/05/22   [provider]  magnesium oxide (MAG-OX) 400 (240 Mg) MG tablet Take 400 mg by mouth daily.    [provider]  ondansetron (ZOFRAN) 4 MG tablet Take 1 tablet (4 mg total) by mouth every 8 (eight) hours as needed for nausea or vomiting. 02/01/23   Karie Georges, MD  rosuvastatin (CRESTOR) 5 MG tablet Take 0.5 tablets (2.5 mg total) by mouth daily. 01/01/23   Karie Georges, MD  spironolactone (  ALDACTONE) 25 MG tablet Take 1 tablet (25 mg total) by mouth 2 (two) times daily. 07/03/22   Karie Georges, MD      Allergies    Prilosec [omeprazole], Amlodipine, Atenolol, Bystolic [nebivolol hcl], Chlorthalidone, Clonidine derivatives, Losartan, Protonix [pantoprazole sodium], Amoxicillin, and Flagyl [metronidazole]    Review of Systems   Review of Systems  Gastrointestinal:  Positive for abdominal pain.    Physical Exam Updated Vital Signs BP (!) 169/77   Pulse 66   Temp 97.7 F (36.5 C)   Resp 20   SpO2 99%  Physical Exam Vitals and nursing note reviewed.  Constitutional:      General: She is not in acute distress.    Appearance: She is well-developed. She is not  diaphoretic.  HENT:     Head: Normocephalic and atraumatic.  Eyes:     Pupils: Pupils are equal, round, and reactive to light.  Cardiovascular:     Rate and Rhythm: Normal rate and regular rhythm.     Heart sounds: No murmur heard.    No friction rub. No gallop.  Pulmonary:     Effort: Pulmonary effort is normal.     Breath sounds: No wheezing or rales.  Abdominal:     General: There is no distension.     Palpations: Abdomen is soft.     Tenderness: There is no abdominal tenderness.     Comments: Benign abdominal exam  Musculoskeletal:        General: No tenderness.     Cervical back: Normal range of motion and neck supple.  Skin:    General: Skin is warm and dry.  Neurological:     Mental Status: She is alert and oriented to person, place, and time.  Psychiatric:        Behavior: Behavior normal.     ED Results / Procedures / Treatments   Labs (all labs ordered are listed, but only abnormal results are displayed) Labs Reviewed  COMPREHENSIVE METABOLIC PANEL - Abnormal; Notable for the following components:      Result Value   Calcium 11.6 (*)    All other components within normal limits  URINALYSIS, ROUTINE W REFLEX MICROSCOPIC - Abnormal; Notable for the following components:   Ketones, ur 40 (*)    Leukocytes,Ua SMALL (*)    All other components within normal limits  LIPASE, BLOOD  CBC    EKG EKG Interpretation Date/Time:  Friday April 20 2023 13:16:48 EST Ventricular Rate:  63 PR Interval:  190 QRS Duration:  92 QT Interval:  400 QTC Calculation: 409 R Axis:   -40  Text Interpretation: Normal sinus rhythm Left axis deviation Left ventricular hypertrophy ( R in aVL , Cornell product , Romhilt-Estes ) Abnormal ECG No significant change since last tracing Confirmed by Melene Plan [phone]) on 04/20/2023 2:32:12 PM  Radiology CT ABDOMEN PELVIS W CONTRAST  Result Date: 04/20/2023 CLINICAL DATA:  Abdomen pain dizziness EXAM: CT ABDOMEN AND PELVIS WITH CONTRAST  TECHNIQUE: Multidetector CT imaging of the abdomen and pelvis was performed using the standard protocol following bolus administration of intravenous contrast. RADIATION DOSE REDUCTION: This exam was performed according to the departmental dose-optimization program which includes automated exposure control, adjustment of the mA and/or kV according to patient size and/or use of iterative reconstruction technique. CONTRAST:  OMNIPAQUE IOHEXOL 300 MG/ML  SOLN COMPARISON:  CT 12/30/2021 FINDINGS: Lower chest: No acute airspace disease.  Small hiatal hernia Hepatobiliary: Subcentimeter hypodensity in the left hepatic lobe too small  to further characterize, no imaging follow-up recommended. No calcified gallstone or biliary dilatation Pancreas: Unremarkable. No pancreatic ductal dilatation or surrounding inflammatory changes. Spleen: Normal in size without focal abnormality. Adrenals/Urinary Tract: Adrenal glands are normal. Kidneys show no hydronephrosis. Punctate nonobstructing right kidney stone. Subcentimeter hypodensity lower pole left kidney too small to further characterize, no imaging follow-up is recommended. Bladder is unremarkable Stomach/Bowel: Stomach is within normal limits. Diverticular disease of the left colon without acute inflammatory process. No evidence of bowel wall thickening, distention, or inflammatory changes. Vascular/Lymphatic: Moderate aortic atherosclerosis. No aneurysm. No suspicious lymph nodes Reproductive: Calcified uterine fibroids.  No adnexal mass Other: Negative for pelvic effusion or free air. Small fat containing periumbilical hernia. Musculoskeletal: No acute or suspicious osseous abnormality IMPRESSION: 1. No CT evidence for acute intra-abdominal or pelvic abnormality. 2. Diverticular disease of the left colon without acute inflammatory process. 3. Punctate nonobstructing right kidney stone. 4. Aortic atherosclerosis. Aortic Atherosclerosis (ICD10-I70.0). Electronically  Signed   By: Jasmine Pang M.D.   On: 04/20/2023 16:03   CT Head Wo Contrast  Result Date: 04/20/2023 CLINICAL DATA:  Headache, new onset (Age >= 51y) EXAM: CT HEAD WITHOUT CONTRAST TECHNIQUE: Contiguous axial images were obtained from the base of the skull through the vertex without intravenous contrast. RADIATION DOSE REDUCTION: This exam was performed according to the departmental dose-optimization program which includes automated exposure control, adjustment of the mA and/or kV according to patient size and/or use of iterative reconstruction technique. COMPARISON:  CT Head 04/23/21 FINDINGS: Brain: No hemorrhage. No hydrocephalus. No extra-axial fluid collection. No CT evidence of an acute cortical infarct. No mass effect. No mass lesion. 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 There is a background of moderate chronic microvascular ischemic change. Vascular: No hyperdense vessel or unexpected calcification. Skull: Normal. Negative for fracture or focal lesion. Sinuses/Orbits: No ear or mastoid effusion. Paranasal sinuses are clear. Bilateral lens replacement. Orbits are otherwise unremarkable. Other: None. IMPRESSION: 1. No CT evidence of intracranial abnormality. 2. 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 3. Moderate chronic microvascular ischemic change. Electronically Signed   By: Lorenza Cambridge M.D.   On: 04/20/2023 15:41    Procedures Procedures    Medications Ordered in ED Medications  metoCLOPramide (REGLAN) injection 5 mg (5 mg Intravenous Given 04/20/23 1439)  morphine (PF) 4 MG/ML injection 4 mg (4 mg Intravenous Given 04/20/23 1438)  hydrALAZINE (APRESOLINE) tablet 12.5 mg (12.5 mg Oral Given 04/20/23 1435)  sodium chloride 0.9 % bolus 1,000 mL (1,000 mLs Intravenous New Bag/Given 04/20/23 1435)  iohexol (OMNIPAQUE) 300 MG/ML solution 80 mL (100 mLs Intravenous Contrast Given 04/20/23 1450)    ED Course/ Medical Decision Making/ A&P Clinical Course as of 04/20/23 1608   Fri Apr 20, 2023  1544 CT Head Wo Contrast IMPRESSION: 1. No CT evidence of intracranial abnormality. 2. 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 3. Moderate chronic microvascular ischemic change.   [TY]    Clinical Course User Index [TY] Coral Spikes, DO                                 Medical Decision Making Amount and/or Complexity of Data Reviewed Labs: ordered. Radiology: ordered.  Risk Prescription drug management.   89 yo F with a chief complaints of abdominal pain and diarrhea.  Going on for about a week.  She went to urgent care today and was found to be hypertensive and she  had mentioned that she been dizzy previously and she was sent here for evaluation.  On further review of systems she told me that she did develop a headache last night.  This is not uncommon for her.  Will give a bolus of IV fluids.  Treat pain and nausea.  Her laboratory evaluation here without significant leukocytosis, no acute anemia, LFTs and lipase are unremarkable.  Will obtain CT imaging of the head and abdomen pelvis.  Awaiting CT.  Patient care signed out to Dr. Maple Hudson, please see their note for further details of care in the ED.  The patients results and plan were reviewed and discussed.   Any x-rays performed were independently reviewed by myself.   Differential diagnosis were considered with the presenting HPI.  Medications  metoCLOPramide (REGLAN) injection 5 mg (5 mg Intravenous Given 04/20/23 1439)  morphine (PF) 4 MG/ML injection 4 mg (4 mg Intravenous Given 04/20/23 1438)  hydrALAZINE (APRESOLINE) tablet 12.5 mg (12.5 mg Oral Given 04/20/23 1435)  sodium chloride 0.9 % bolus 1,000 mL (1,000 mLs Intravenous New Bag/Given 04/20/23 1435)  iohexol (OMNIPAQUE) 300 MG/ML solution 80 mL (100 mLs Intravenous Contrast Given 04/20/23 1450)    Vitals:   04/20/23 1224 04/20/23 1400 04/20/23 1525  BP: (!) 202/81 (!) 183/72 (!) 169/77  Pulse: 75 65 66  Resp: 16 19 20   Temp: 97.7 F  (36.5 C)    SpO2: 99% 97% 99%    Final diagnoses:  Diarrhea, unspecified type  Lower abdominal pain  Acute nonintractable headache, unspecified headache type           Final Clinical Impression(s) / ED Diagnoses Final diagnoses:  Diarrhea, unspecified type  Lower abdominal pain  Acute nonintractable headache, unspecified headache type    Rx / DC Orders ED Discharge Orders     None         Melene Plan, DO 04/20/23 1608

## 2023-04-20 NOTE — ED Triage Notes (Signed)
Pt reports mid lower abdominal pain and cramping along with diarrhea for 1 week.  Denies n/v/fever at home.

## 2023-04-20 NOTE — ED Provider Notes (Signed)
[facility]    CSN: [id number] Arrival date & time: 04/20/23  6010      History   Chief Complaint Chief Complaint  Patient presents with   Abdominal Pain   Diarrhea    HPI Robin [name] is a 89 y.o. female.   Patient presents today with lower abdominal pain swelling loose stools x 1 week.  Has elevated blood pressure after taking daily blood pressure medication.  Patient states that she has had abdominal pain for the past year with IBS and has seen GI but this pain is different.  Denies any chest pain no shortness of breath no fevers.denies any urinary sx  Patient is not staying hydrated states that she has not really been able to eat or drink much lately.  Unknown if stools are dark in color or blood is visible.  Has not taken anything except daily medicines prior to arrival    Past Medical History:  Diagnosis Date   Abnormal Pap smear of cervix    Allergy    Arthritis    Asthma    Cataract 2018   bilateral eyes   Colon polyps    tubulovillous adenoma, 2010   External hemorrhoids without mention of complication    GERD (gastroesophageal reflux disease)    Headache    eval with neuro in 2011   Hyperlipemia    Hyperparathyroidism (HCC)    seeing Dr. Lucianne Muss in endocrinology   Hypertension    IBS (irritable bowel syndrome), GERD, hx colon polyps, chronic abd pain - followed by Dr. Jarold Motto in GI 06/11/2012   Osteoporosis 08/29/2011   Vertigo    eval with neuro in 2011, brief recurrence 2016    Patient Active Problem List   Diagnosis Date Noted   HYPERCHOLESTEROLEMIA 01/05/2023   Peripheral neuropathy 11/30/2022   Osteoporosis of femur without pathological fracture 06/24/2019   Dyspnea 02/03/2019   Asthma 02/03/2019   Vertigo 06/26/2017   Hyperparathyroidism (HCC) 08/24/2014   Slow transit constipation 01/13/2014   IBS (irritable bowel syndrome), GERD, hx colon polyps, chronic abd pain - followed by Dr. Jarold Motto in GI 06/11/2012   Osteopenia  08/29/2011   Vitamin D deficiency 08/29/2011   GERD (gastroesophageal reflux disease) 11/23/2010   Dyslipidemia 09/09/2007   Hypertension, essential, benign 09/09/2007    Past Surgical History:  Procedure Laterality Date   BACK SURGERY  1974   CARPAL TUNNEL RELEASE Left 06/2008   COLONOSCOPY     EYE SURGERY  07/2017   bilateral cataract extraction   POLYPECTOMY     TONSILLECTOMY     UPPER GASTROINTESTINAL ENDOSCOPY      OB History     Gravida  2   Para  2   Term  2   Preterm      AB      Living  2      SAB      IAB      Ectopic      Multiple      Live Births  2            Home Medications    Prior to Admission medications   Medication Sig Start Date End Date Taking? Authorizing Provider  acetaminophen (TYLENOL) 500 MG tablet Take 500-1,000 mg by mouth every 6 (six) hours as needed for mild pain or headache.     [provider]  AMBULATORY NON FORMULARY MEDICATION Medication Name: GI Cocktail - 90 ml 2% Lidocaine, 90 ml Dicyclomine 10mg /38ml, 270 ml  Maalox - Take 5-10 ml every 6 hour as needed. 10/21/21   Unk Lightning, PA  amitriptyline (ELAVIL) 10 MG tablet Take 1 tablet (10 mg total) by mouth at bedtime. 02/01/23   Karie Georges, MD  baclofen (LIORESAL) 10 MG tablet Take 5 mg by mouth 2 (two) times daily as needed. 11/08/22   [provider]  carvedilol (COREG) 3.125 MG tablet TAKE 1 TABLET BY MOUTH TWICE DAILY WITH A MEAL 12/12/22   Lewayne Bunting, MD  cetirizine (ZYRTEC) 10 MG tablet Take 10 mg by mouth daily.    [provider]  Cholecalciferol 25 MCG (1000 UT) TBDP Take 1,000 Units by mouth daily. 11/18/18   Romero Belling, MD  clobetasol ointment (TEMOVATE) 0.05 % Apply to affected area twice weekly. 03/22/23   Chrzanowski, Clearnce Hasten B, NP  esomeprazole (NEXIUM) 40 MG capsule Take 1 capsule (40 mg total) by mouth 2 (two) times daily before a meal. 07/03/22   Karie Georges, MD  ezetimibe (ZETIA) 10 MG tablet TAKE 1  TABLET BY MOUTH DAILY 01/01/23   Karie Georges, MD  furosemide (LASIX) 20 MG tablet 1 tablet by mouth every other day as needed for swelling 07/19/20   Lewayne Bunting, MD  hydrALAZINE (APRESOLINE) 25 MG tablet Take 0.5 tablets (12.5 mg total) by mouth 2 (two) times daily. 07/03/22   Karie Georges, MD  lidocaine (XYLOCAINE) 5 % ointment Apply 1 Application topically as directed. 12/05/22   [provider]  magnesium oxide (MAG-OX) 400 (240 Mg) MG tablet Take 400 mg by mouth daily.    [provider]  ondansetron (ZOFRAN) 4 MG tablet Take 1 tablet (4 mg total) by mouth every 8 (eight) hours as needed for nausea or vomiting. 02/01/23   Karie Georges, MD  rosuvastatin (CRESTOR) 5 MG tablet Take 0.5 tablets (2.5 mg total) by mouth daily. 01/01/23   Karie Georges, MD  spironolactone (ALDACTONE) 25 MG tablet Take 1 tablet (25 mg total) by mouth 2 (two) times daily. 07/03/22   Karie Georges, MD    Family History Family History  Problem Relation Age of Onset   CAD Mother        Died of MI at age 58   Hypertension Mother    Hypertension Father    Stroke Father    Diabetes Sister    Hypertension Sister    Heart disease Sister        heart failure   Stroke Sister 53   Hypertension Sister    Diabetes Brother    Hypertension Brother    Stroke Brother 55   CAD Brother        Died of MI at age 62   Hypertension Brother    Stroke Brother 60   Hypertension Brother    Breast cancer Niece    Colon cancer Neg Hx    Colon polyps Neg Hx    Esophageal cancer Neg Hx    Pancreatic cancer Neg Hx    Stomach cancer Neg Hx    Rectal cancer Neg Hx     Social History Social History   Tobacco Use   Smoking status: Never   Smokeless tobacco: Never  Vaping Use   Vaping status: Never Used  Substance Use Topics   Alcohol use: No    Alcohol/week: 0.0 standard drinks of alcohol   Drug use: No     Allergies   Prilosec [omeprazole], Amlodipine, Atenolol, Bystolic  [nebivolol hcl], Chlorthalidone, Clonidine  derivatives, Losartan, Protonix [pantoprazole sodium], Amoxicillin, and Flagyl [metronidazole]   Review of Systems Review of Systems  Constitutional:  Positive for activity change. Negative for fever.  HENT: Negative.    Respiratory: Negative.    Cardiovascular: Negative.   Gastrointestinal:  Positive for abdominal distention, abdominal pain, diarrhea and nausea. Negative for constipation and vomiting.  Genitourinary:  Negative for difficulty urinating, dysuria, flank pain, frequency and hematuria.  Neurological:        Denies any dizziness at this time states in the past she has had it intermittently     Physical Exam Triage Vital Signs ED Triage Vitals  Encounter Vitals Group     BP 04/20/23 1025 (S) (!) 210/98     Systolic BP Percentile --      Diastolic BP Percentile --      Pulse Rate 04/20/23 1025 80     Resp 04/20/23 1025 18     Temp 04/20/23 1025 97.9 F (36.6 C)     Temp Source 04/20/23 1025 Oral     SpO2 04/20/23 1025 97 %     Weight 04/20/23 1031 154 lb 12.2 oz (70.2 kg)     Height 04/20/23 1031 4\' 11"  (1.499 m)     Head Circumference --      Peak Flow --      Pain Score 04/20/23 1026 6     Pain Loc --      Pain Education --      Exclude from Growth Chart --    No data found.  Updated Vital Signs BP (!) 188/83 (BP Location: Left Arm)   Pulse 80   Temp 97.9 F (36.6 C) (Oral)   Resp 18   Ht 4\' 11"  (1.499 m)   Wt 154 lb 12.2 oz (70.2 kg)   SpO2 97%   BMI 31.26 kg/m   Visual Acuity Right Eye Distance:   Left Eye Distance:   Bilateral Distance:    Right Eye Near:   Left Eye Near:    Bilateral Near:     Physical Exam Constitutional:      Appearance: She is well-developed.  Pulmonary:     Effort: Pulmonary effort is normal.     Breath sounds: Normal breath sounds.  Abdominal:     General: Bowel sounds are decreased. There is distension.     Palpations: Abdomen is soft.     Tenderness: There is  abdominal tenderness in the periumbilical area. There is no right CVA tenderness or left CVA tenderness.  Neurological:     General: No focal deficit present.     Mental Status: She is alert.      UC Treatments / Results  Labs (all labs ordered are listed, but only abnormal results are displayed) Labs Reviewed  POCT FASTING CBG [facility] MANUAL ENTRY    EKG   Radiology No results found.  Procedures Procedures (including critical care time)  Medications Ordered in UC Medications - No data to display  Initial Impression / Assessment and Plan / UC Course  I have reviewed the triage vital signs and the nursing notes.  Pertinent labs & imaging results that were available during my care of the patient were reviewed by me and considered in my medical decision making (see chart for details).     .  EKGs compaired to previous EKG Discussed with patient she will need to have lab lab work and further evaluation and CT scan completed.  Discussed with patient she will need to be  seen in the emergency room patient states that she lives 5 minutes away she feels fine and wants to drive herself home for her daughter to take her to the hospital. patient did not want to have ambulance transport.  Patient wants to go to [facility] and understands she may need to be transported for long-term care at [facility].  Patient understands plan of care.  Offered to call family member and discuss treatment plan patient do not feel this is necessary.  Patient called daughter while status in the room to come transport.  Final Clinical Impressions(s) / UC Diagnoses   Final diagnoses:  None   Discharge Instructions   None    ED Prescriptions   None    PDMP not reviewed this encounter.  [facility]   Coralyn Mark, NP 04/20/23 1457

## 2023-04-30 ENCOUNTER — Encounter: Payer: Self-pay | Admitting: Family Medicine

## 2023-04-30 ENCOUNTER — Ambulatory Visit (INDEPENDENT_AMBULATORY_CARE_PROVIDER_SITE_OTHER): Payer: PPO | Admitting: Family Medicine

## 2023-04-30 VITALS — BP 148/60 | HR 62 | Temp 98.0°F | Ht 59.0 in | Wt 156.6 lb

## 2023-04-30 DIAGNOSIS — K581 Irritable bowel syndrome with constipation: Secondary | ICD-10-CM | POA: Diagnosis not present

## 2023-04-30 DIAGNOSIS — E213 Hyperparathyroidism, unspecified: Secondary | ICD-10-CM

## 2023-04-30 DIAGNOSIS — G6289 Other specified polyneuropathies: Secondary | ICD-10-CM

## 2023-04-30 DIAGNOSIS — E7800 Pure hypercholesterolemia, unspecified: Secondary | ICD-10-CM

## 2023-04-30 MED ORDER — EZETIMIBE 10 MG PO TABS: ORAL_TABLET | ORAL | 1 refills | Status: DC

## 2023-04-30 MED ORDER — LACTULOSE 10 GM/15ML PO SOLN: 10.0000 g | Freq: Every day | ORAL | 3 refills | Status: AC

## 2023-04-30 MED ORDER — AMITRIPTYLINE HCL 25 MG PO TABS: 25.0000 mg | ORAL_TABLET | Freq: Every day | ORAL | 2 refills | Status: DC

## 2023-04-30 NOTE — Assessment & Plan Note (Signed)
No improvement on the 10 mg TCA, will increase to 25 mg daily at bedtime, I discussed that increasing the dose would help, we discussed possible side effects and I advised she let me know if hse experiences any on the medication

## 2023-04-30 NOTE — Assessment & Plan Note (Signed)
Vitamin D is normal, her PTH is elevated as is her calcium level. I do think that the bisphosphonate would help, she has a DEXA scan ordered for June 14, 2023, will see her after the DEXA scan to discuss starting the bisphosphonate.

## 2023-04-30 NOTE — Assessment & Plan Note (Signed)
Had severe episode of diarrhea on 12/6, I reviewed the ED notes and the CT abd. I advised switching her to lactulose and she should adjust the dosing/frequency at home. She may also use metamucil and probiotics daily to help with regularity.

## 2023-04-30 NOTE — Patient Instructions (Addendum)
Senokot-S -- ok to take 1-2 tablets once a week as needed for bowel movement.  Switch from Miralax to Lactulose once daily -- play around with the dose to see what is going to work for you.   You may also try adding a packet of metamucil once daily  Probiotics-- help with replacing healthy bacteria that live in the gut to digest food. -- Florastor, Align, lactobacillus acidophyllus is the bacteria.

## 2023-04-30 NOTE — Progress Notes (Signed)
Established Patient Office Visit  Subjective   Patient ID: Robin [name], female    DOB: 23-Aug-1933  Age: 89 y.o. MRN: [id number]  Chief Complaint  Patient presents with   Medical Management of Chronic Issues    Pt is here for follow up on the chronic nausea. She reports that the amitriptyline did help her sleep but it didn't do anything to help with the nausea or her neuropathy. States that she had an episode of diarrhea after taking miralax for 3 nights in a row, states that she was terribly constipated. States that she had diarrhea for a week straight, was seen in the UC and her BP was very high and so they sent her to the ER. States that it was probably from being upset because her blood pressure.  Patient has chronic constipation and usually takes miralax daily  which usually helps but this time the diarrhea was very severe and now she feels like the miralax isn't helping as much as it did before.      Current Outpatient Medications  Medication Instructions   acetaminophen (TYLENOL) 500-1,000 mg, Every 6 hours PRN   AMBULATORY NON FORMULARY MEDICATION Medication Name: GI Cocktail - 90 ml 2% Lidocaine, 90 ml Dicyclomine 10mg /41ml, 270 ml Maalox - Take 5-10 ml every 6 hour as needed.   amitriptyline (ELAVIL) 25 mg, Oral, Daily at bedtime   baclofen (LIORESAL) 5 mg, 2 times daily PRN   carvedilol (COREG) 3.125 MG tablet TAKE 1 TABLET BY MOUTH TWICE DAILY WITH A MEAL   cetirizine (ZYRTEC) 10 mg, Daily   Cholecalciferol 1,000 Units, Oral, Daily   clobetasol ointment (TEMOVATE) 0.05 % Apply to affected area twice weekly.   esomeprazole (NEXIUM) 40 mg, Oral, 2 times daily before meals   ezetimibe (ZETIA) 10 MG tablet TAKE 1 TABLET BY MOUTH DAILY   furosemide (LASIX) 20 MG tablet 1 tablet by mouth every other day as needed for swelling   hydrALAZINE (APRESOLINE) 12.5 mg, Oral, 2 times daily   lactulose (CHRONULAC) 10 g, Oral, Daily   lidocaine (XYLOCAINE) 5 % ointment 1 Application,  As directed   loperamide (IMODIUM) 2 mg, Oral, 4 times daily PRN   magnesium oxide (MAG-OX) 400 mg, Daily   ondansetron (ZOFRAN) 4 mg, Oral, Every 8 hours PRN   rosuvastatin (CRESTOR) 2.5 mg, Oral, Daily   spironolactone (ALDACTONE) 25 mg, Oral, 2 times daily    Patient Active Problem List   Diagnosis Date Noted   HYPERCHOLESTEROLEMIA 01/05/2023   Peripheral neuropathy 11/30/2022   Osteoporosis of femur without pathological fracture 06/24/2019   Dyspnea 02/03/2019   Asthma 02/03/2019   Vertigo 06/26/2017   Hyperparathyroidism (HCC) 08/24/2014   Slow transit constipation 01/13/2014   IBS (irritable bowel syndrome), GERD, hx colon polyps, chronic abd pain - followed by Dr. Jarold Motto in GI 06/11/2012   Osteopenia 08/29/2011   Vitamin D deficiency 08/29/2011   GERD (gastroesophageal reflux disease) 11/23/2010   Dyslipidemia 09/09/2007   Hypertension, essential, benign 09/09/2007      Review of Systems  All other systems reviewed and are negative.     Objective:     BP (!) 148/60   Pulse 62   Temp 98 F (36.7 C) (Oral)   Ht 4\' 11"  (1.499 m)   Wt 156 lb 9.6 oz (71 kg)   SpO2 96%   BMI 31.63 kg/m    Physical Exam Vitals reviewed.  Constitutional:      Appearance: Normal appearance. She is well-groomed and normal  weight.  Cardiovascular:     Rate and Rhythm: Normal rate and regular rhythm.     Pulses: Normal pulses.     Heart sounds: S1 normal and S2 normal.  Pulmonary:     Effort: Pulmonary effort is normal.     Breath sounds: Normal breath sounds and air entry.  Abdominal:     General: Bowel sounds are normal.  Neurological:     Mental Status: She is alert and oriented to person, place, and time. Mental status is at baseline.     Gait: Gait is intact.  Psychiatric:        Mood and Affect: Mood and affect normal.        Speech: Speech normal.        Behavior: Behavior normal.      No results found for any visits on 04/30/23.    The ASCVD Risk score  (Arnett DK, et al., 2019) failed to calculate for the following reasons:   The 2019 ASCVD risk score is only valid for ages 56 to 53    Assessment & Plan:  Irritable bowel syndrome with constipation Assessment & Plan: Had severe episode of diarrhea on 12/6, I reviewed the ED notes and the CT abd. I advised switching her to lactulose and she should adjust the dosing/frequency at home. She may also use metamucil and probiotics daily to help with regularity.   Orders: -     Lactulose; Take 15 mLs (10 g total) by mouth daily.  Dispense: 473 mL; Refill: 3  Other polyneuropathy Assessment & Plan: No improvement on the 10 mg TCA, will increase to 25 mg daily at bedtime, I discussed that increasing the dose would help, we discussed possible side effects and I advised she let me know if hse experiences any on the medication  Orders: -     Amitriptyline HCl; Take 1 tablet (25 mg total) by mouth at bedtime.  Dispense: 30 tablet; Refill: 2  HYPERCHOLESTEROLEMIA -     Ezetimibe; TAKE 1 TABLET BY MOUTH DAILY  Dispense: 90 tablet; Refill: 1  Hyperparathyroidism (HCC) Assessment & Plan: Vitamin D is normal, her PTH is elevated as is her calcium level. I do think that the bisphosphonate would help, she has a DEXA scan ordered for June 14, 2023, will see her after the DEXA scan to discuss starting the bisphosphonate.       No follow-ups on file.    Karie Georges, MD

## 2023-06-14 ENCOUNTER — Ambulatory Visit
Admission: RE | Admit: 2023-06-14 | Discharge: 2023-06-14 | Disposition: A | Discharge status: Home / Self Care | Payer: PPO | Source: Ambulatory Visit | Attending: Family Medicine | Admitting: Family Medicine

## 2023-06-14 DIAGNOSIS — M8588 Other specified disorders of bone density and structure, other site: Secondary | ICD-10-CM | POA: Diagnosis not present

## 2023-06-14 DIAGNOSIS — Z780 Asymptomatic menopausal state: Secondary | ICD-10-CM

## 2023-06-14 DIAGNOSIS — E2839 Other primary ovarian failure: Secondary | ICD-10-CM | POA: Diagnosis not present

## 2023-06-14 DIAGNOSIS — N958 Other specified menopausal and perimenopausal disorders: Secondary | ICD-10-CM | POA: Diagnosis not present

## 2023-06-21 ENCOUNTER — Other Ambulatory Visit: Payer: Self-pay | Admitting: Family Medicine

## 2023-06-21 DIAGNOSIS — I10 Essential (primary) hypertension: Secondary | ICD-10-CM

## 2023-06-28 ENCOUNTER — Encounter: Payer: Self-pay | Admitting: Family Medicine

## 2023-07-02 ENCOUNTER — Ambulatory Visit: Payer: PPO | Admitting: Family Medicine

## 2023-07-03 ENCOUNTER — Other Ambulatory Visit: Payer: Self-pay | Admitting: Family Medicine

## 2023-07-03 DIAGNOSIS — I10 Essential (primary) hypertension: Secondary | ICD-10-CM

## 2023-07-03 DIAGNOSIS — K2100 Gastro-esophageal reflux disease with esophagitis, without bleeding: Secondary | ICD-10-CM

## 2023-07-11 ENCOUNTER — Encounter: Payer: Self-pay | Admitting: Family Medicine

## 2023-07-11 DIAGNOSIS — M25511 Pain in right shoulder: Secondary | ICD-10-CM

## 2023-07-13 NOTE — Telephone Encounter (Signed)
Ok to send referral to PT

## 2023-07-17 NOTE — Progress Notes (Signed)
 89 y.o. G43P2002 Widowed Caucasian female here for annual exam.  Patient states she would also like to talk about a skin condition that she has.     History of lichen sclerosis.  Has burning and itching.  Had biopsy 11/18/19 with Dr. Leda Quail:  lichen sclerosis.   She is a former patient of Clarita Crane, last visit on 07/29/20.   She has Rx for Clobetasol ointment, using liberally in quantity, twice a week. She notes increased skin pigmentation of her lower extremities when she is using this medication.  If she decreases the use of the Clobetasol, this skin coloration improves. Uses Vaseline on the vulva also.   Having vaginal odor.    Some urinary frequency.  Takes a diuretic.   Up at night to void.   No vaginal bleeding.    Not sexually active.   Declines a pap if not needed.   Dealing with left shoulder pain and treatment.   PCP: Karie Georges, MD   No LMP recorded. Patient is postmenopausal.           Sexually active: No.  The current method of family planning is post menopausal status.    Menopausal hormone therapy:  n/a Exercising: No.   Smoker:  no  OB History  Gravida Para Term Preterm AB Living  2 2 2   2   SAB IAB Ectopic Multiple Live Births      2    # Outcome Date GA Lbr Len/2nd Weight Sex Type Anes PTL Lv  2 Term     F Vag-Spont  N LIV  1 Term     M Vag-Spont  N LIV     HEALTH MAINTENANCE: Last 2 paps:  Unsure date just knows she stopped getting them before she turned 82. History of abnormal Pap or positive HPV:  no Mammogram:   03/19/23 Breast Density Cat B, BI-RADS CAT 1 neg Colonoscopy:  01/22/23 Bone Density:  06/14/23  Result  osteoporosis   Immunization History  Administered Date(s) Administered   Fluad Quad(high Dose 65+) 03/04/2019, 02/02/2020, 03/28/2021, 02/06/2022   Fluad Trivalent(High Dose 65+) 03/09/2023   Influenza, High Dose Seasonal PF 03/23/2015, 02/22/2016, 02/13/2017, 03/11/2018   Influenza,inj,Quad PF,6+ Mos  03/07/2013, 03/03/2014   PFIZER(Purple Top)SARS-COV-2 Vaccination 06/21/2019, 07/12/2019, 03/30/2020, 12/03/2020   Pfizer(Comirnaty)Fall Seasonal Vaccine 12 years and older 02/28/2022   Pneumococcal Conjugate-13 08/10/2014   Pneumococcal Polysaccharide-23 07/23/2012   Tdap 07/23/2012   Zoster, Live 09/30/2013      reports that she has never smoked. She has never used smokeless tobacco. She reports that she does not drink alcohol and does not use drugs.  Past Medical History:  Diagnosis Date   Abnormal Pap smear of cervix    Allergy    Arthritis    Asthma    Cataract 2018   bilateral eyes   Colon polyps    tubulovillous adenoma, 2010   External hemorrhoids without mention of complication    GERD (gastroesophageal reflux disease)    Headache    eval with neuro in 2011   Hyperlipemia    Hyperparathyroidism (HCC)    seeing Dr. Lucianne Muss in endocrinology   Hypertension    IBS (irritable bowel syndrome), GERD, hx colon polyps, chronic abd pain - followed by Dr. Jarold Motto in GI 06/11/2012   Osteoporosis 08/29/2011   Vertigo    eval with neuro in 2011, brief recurrence 2016    Past Surgical History:  Procedure Laterality Date   BACK SURGERY  1974  CARPAL TUNNEL RELEASE Left 06/2008   COLONOSCOPY     EYE SURGERY  07/2017   bilateral cataract extraction   POLYPECTOMY     TONSILLECTOMY     UPPER GASTROINTESTINAL ENDOSCOPY      Current Outpatient Medications  Medication Sig Dispense Refill   acetaminophen (TYLENOL) 500 MG tablet Take 500-1,000 mg by mouth every 6 (six) hours as needed for mild pain or headache.      AMBULATORY NON FORMULARY MEDICATION Medication Name: GI Cocktail - 90 ml 2% Lidocaine, 90 ml Dicyclomine 10mg /65ml, 270 ml Maalox - Take 5-10 ml every 6 hour as needed. 450 mL 3   amitriptyline (ELAVIL) 25 MG tablet Take 1 tablet (25 mg total) by mouth at bedtime. 30 tablet 2   baclofen (LIORESAL) 10 MG tablet Take 5 mg by mouth 2 (two) times daily as needed.      carvedilol (COREG) 3.125 MG tablet TAKE 1 TABLET BY MOUTH TWICE DAILY WITH A MEAL 180 tablet 3   cetirizine (ZYRTEC) 10 MG tablet Take 10 mg by mouth daily.     Cholecalciferol 25 MCG (1000 UT) TBDP Take 1,000 Units by mouth daily. 90 tablet 3   clobetasol ointment (TEMOVATE) 0.05 % Apply to affected area twice weekly. 60 g 0   esomeprazole (NEXIUM) 40 MG capsule TAKE 1 CAPSULE BY MOUTH TWICE DAILY BEFORE A MEAL 180 capsule 1   ezetimibe (ZETIA) 10 MG tablet TAKE 1 TABLET BY MOUTH DAILY 90 tablet 1   furosemide (LASIX) 20 MG tablet 1 tablet by mouth every other day as needed for swelling 45 tablet 3   hydrALAZINE (APRESOLINE) 25 MG tablet TAKE 1/2 TABLET BY MOUTH TWICE DAILY 90 tablet 3   lactulose (CHRONULAC) 10 GM/15ML solution Take 15 mLs (10 g total) by mouth daily. 473 mL 3   lidocaine (XYLOCAINE) 5 % ointment Apply 1 Application topically as directed.     loperamide (IMODIUM) 2 MG capsule Take 1 capsule (2 mg total) by mouth 4 (four) times daily as needed for diarrhea or loose stools. 12 capsule 0   magnesium oxide (MAG-OX) 400 (240 Mg) MG tablet Take 400 mg by mouth daily.     ondansetron (ZOFRAN) 4 MG tablet Take 1 tablet (4 mg total) by mouth every 8 (eight) hours as needed for nausea or vomiting. 30 tablet 5   rosuvastatin (CRESTOR) 5 MG tablet Take 0.5 tablets (2.5 mg total) by mouth daily. 45 tablet 1   spironolactone (ALDACTONE) 25 MG tablet TAKE 1 TABLET BY MOUTH TWICE DAILY 180 tablet 3   No current facility-administered medications for this visit.    ALLERGIES: Prilosec [omeprazole], Amlodipine, Atenolol, Bystolic [nebivolol hcl], Chlorthalidone, Clonidine derivatives, Losartan, Protonix [pantoprazole sodium], Amoxicillin, and Flagyl [metronidazole]  Family History  Problem Relation Age of Onset   CAD Mother        Died of MI at age 50   Hypertension Mother    Hypertension Father    Stroke Father    Diabetes Sister    Hypertension Sister    Heart disease Sister         heart failure   Stroke Sister 52   Hypertension Sister    Diabetes Brother    Hypertension Brother    Stroke Brother 43   CAD Brother        Died of MI at age 6   Hypertension Brother    Stroke Brother 60   Hypertension Brother    Breast cancer Niece    Colon  cancer Neg Hx    Colon polyps Neg Hx    Esophageal cancer Neg Hx    Pancreatic cancer Neg Hx    Stomach cancer Neg Hx    Rectal cancer Neg Hx     Review of Systems  All other systems reviewed and are negative.   PHYSICAL EXAM:  BP 134/88 (BP Location: Right Arm, Patient Position: Sitting, Cuff Size: Normal)   Pulse 73   Ht 5' 1.5" (1.562 m)   Wt 157 lb (71.2 kg)   SpO2 97%   BMI 29.18 kg/m     General appearance: alert, cooperative and appears stated age Head: normocephalic, without obvious abnormality, atraumatic Neck: no adenopathy, supple, symmetrical, trachea midline and thyroid normal to inspection and palpation Lungs: clear to auscultation bilaterally Breasts: normal appearance, no masses or tenderness, No nipple retraction or dimpling, No nipple discharge or bleeding, No axillary adenopathy Heart: regular rate and rhythm.  Mild systolic murmur noted.  Abdomen: soft, non-tender; no masses, no organomegaly Extremities: extremities normal, atraumatic, no cyanosis or edema Skin: skin color, texture, turgor normal. Varicose veins noted. Lymph nodes: cervical, supraclavicular, and axillary nodes normal. Neurologic: grossly normal  Pelvic: External genitalia:  perineal thickening, mild.               No abnormal inguinal nodes palpated.              Urethra:  normal appearing urethra with no masses, tenderness or lesions              Bartholins and Skenes: normal                 Vagina: normal appearing vagina with normal color and discharge, no lesions              Cervix: no lesions              Pap taken: no.   Bimanual Exam:  Uterus:  normal size, contour, position, consistency, mobility, non-tender.  Full  bladder palpated.               Adnexa: no mass, fullness, tenderness              Rectal exam: Yes.  .  Confirms.              Anus:  normal sphincter tone, no lesions  Chaperone was present for exam:  yes.  ASSESSMENT: Breast and pelvic exam.  Lichen sclerosus.  Clobetasol sparingly.  Vaginal odor.  Osteoporosis.  Not treating yet.  Addressing left should pain.  PCP following.   PLAN: Mammogram screening discussed. Self breast awareness reviewed. Pap and HRV collected:  no Vaginitis testing performed.  Guidelines for Calcium, Vitamin D, regular exercise program including cardiovascular and weight bearing exercise. Prior vulvar biopsy result reviewed in Epic.  Medication refills:  Clobetasol ointment.  We reviewed recommended guidelines for usage and that this is to be used sparingly to avoid further thinning of the vaginal tissue and to avoid adrenal gland function suppression.  Follow up:  yearly and prn.     Additional counseling given.  yes. 30 min total time was spent for this patient encounter, including preparation, face-to-face counseling with the patient, coordination of care, and documentation of the encounter for lichens sclerosis care and vaginitis evaluation in addition to doing the breast and pelvic exam.

## 2023-07-18 NOTE — Therapy (Signed)
 OUTPATIENT PHYSICAL THERAPY SHOULDER EVALUATION   Patient Name: Robin [name] MRN: [id number] DOB:Mar 31, 1934, 89 y.o., female Today's Date: 07/19/2023  END OF SESSION:  PT End of Session - 07/19/23 1033     Visit Number 1    Number of Visits 13    Date for PT Re-Evaluation 08/30/23    Authorization Type [employer]    PT Start Time 0930    PT Stop Time 1025    PT Time Calculation (min) 55 min    Activity Tolerance No increased pain;Patient limited by fatigue    Behavior During Therapy [facility] for tasks assessed/performed             Past Medical History:  Diagnosis Date   Abnormal Pap smear of cervix    Allergy    Arthritis    Asthma    Cataract 2018   bilateral eyes   Colon polyps    tubulovillous adenoma, 2010   External hemorrhoids without mention of complication    GERD (gastroesophageal reflux disease)    Headache    eval with neuro in 2011   Hyperlipemia    Hyperparathyroidism (HCC)    seeing Dr. Lucianne Muss in endocrinology   Hypertension    IBS (irritable bowel syndrome), GERD, hx colon polyps, chronic abd pain - followed by Dr. Jarold Motto in GI 06/11/2012   Osteoporosis 08/29/2011   Vertigo    eval with neuro in 2011, brief recurrence 2016   Past Surgical History:  Procedure Laterality Date   BACK SURGERY  1974   CARPAL TUNNEL RELEASE Left 06/2008   COLONOSCOPY     EYE SURGERY  07/2017   bilateral cataract extraction   POLYPECTOMY     TONSILLECTOMY     UPPER GASTROINTESTINAL ENDOSCOPY     Patient Active Problem List   Diagnosis Date Noted   HYPERCHOLESTEROLEMIA 01/05/2023   Peripheral neuropathy 11/30/2022   Osteoporosis of femur without pathological fracture 06/24/2019   Dyspnea 02/03/2019   Asthma 02/03/2019   Vertigo 06/26/2017   Hyperparathyroidism (HCC) 08/24/2014   Slow transit constipation 01/13/2014   IBS (irritable bowel syndrome), GERD, hx colon polyps, chronic abd pain - followed by Dr. Jarold Motto in GI 06/11/2012   Osteopenia  08/29/2011   Vitamin D deficiency 08/29/2011   GERD (gastroesophageal reflux disease) 11/23/2010   Dyslipidemia 09/09/2007   Hypertension, essential, benign 09/09/2007    PCP: Karie Georges, MD   REFERRING PROVIDER: Karie Georges, MD   REFERRING DIAG: M25.511 (ICD-10-CM) - Right shoulder pain, unspecified chronicity   THERAPY DIAG:  Acute pain of right shoulder  Chronic right shoulder pain  Cervicalgia  Muscle weakness  Rationale for Evaluation and Treatment: Rehabilitation  ONSET DATE: R shld pain  3 days ago, but left shoulder 3 weeks ago  SUBJECTIVE:  SUBJECTIVE STATEMENT: I am here because I have pain in my left shoulder that started 3 weeks ago but I also have pain in my R shoulder that began 3 days ago.  I sleep on my R normally but now I sleep anyway I can and for a week I slept in a recliner. I am now back in  the bed and I sleep any way I can.  I can only sleep 2 hours as a time. I have trouble moving my neck as well.  I really have pain in my left shoulder. I wonder if my over using my right arm is causing pain in my left arm. I have about 8/10 pain in my left now.  Pain with any reaching activities.  Difficulty with bathing and clothing. Neck motion affects her pain radiating down arm Hand dominance: Right  PERTINENT HISTORY: HTN, back surgery 1975, carpal tunnel, vertigo, osteoporosis  PAIN:  Are you having pain? Yes: NPRS scale: R shld 0/10 at rest and at worst 4-5/10 . Left at rest  8/10 and at worst 10/10 Pain location: R shld and L shld Pain description: achy sharp Aggravating factors: any movement, sleeping, pulling up pants, carrying groceries Relieving factors: resting, hot shower, icy hot, biofreeze  PRECAUTIONS: None  RED FLAGS: None   WEIGHT BEARING  RESTRICTIONS: No  FALLS:  Has patient fallen in last 6 months? 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  DIAGNOSTIC FINDINGS:    FINDINGS: Limited evaluation due to overlapping osseous structures and overlying soft tissues. Multilevel degenerative changes. Possible multilevel osseous neural foraminal stenosis with limited evaluation due to positioning. There is no evidence of cervical spine fracture or prevertebral soft tissue swelling. Alignment is normal. No other significant bone abnormalities are identified.   IMPRESSION: No acute displaced fracture or traumatic listhesis of the cervical spine. Limited evaluation due to overlapping osseous structures and overlying soft tissues.     Electronically Signed   By: Tish Frederickson M.D.   On: 10/08/2022 00:28  RIGHT SHOULDER - 2+ VIEW   COMPARISON:  None Available.   FINDINGS: Mild AC joint degenerative change. No fracture or dislocation. No other bony abnormalities.   IMPRESSION: Mild AC joint degenerative change.     Electronically Signed   By: Gerome Sam III M.D.   On: 10/05/2022 13:33 PATIENT SURVEYS:  Quick Dash 56.8%  COGNITION: Overall cognitive status: Within functional limits for tasks assessed     SENSATION: Pt with radiating pain from neck into Right shoulder  POSTURE: Posture forward head and flexed trunk / was sleeping in recliner and recently returned to bed   CERVICAL ROM:   Active ROM A/PROM (deg) eval  Flexion 35  Extension 15*  Right lateral flexion 22*  Left lateral flexion 23*  Right rotation 33 *   Left rotation 40   (Blank rows = not tested  N= WNL * concordant)  UPPER EXTREMITY ROM:   A ROM/PROM Right eval Left eval  Shoulder flexion 115 95 neck upper shld pain  Shoulder extension    Shoulder abduction 80 Pain on attempt < 60  Shoulder adduction  Shoulder internal rotation Mid buttock To ischial tuberosity  Shoulder external rotation To neck  with pain To clavicle with pain  Elbow flexion    Elbow extension    Wrist flexion    Wrist extension    Wrist ulnar deviation    Wrist radial deviation    Wrist pronation    Wrist supination    (Blank rows = not tested)  UPPER EXTREMITY MMT:  MMT Right eval Left eval  Shoulder flexion 4- 3-  Shoulder extension    Shoulder abduction 3- 3-  Shoulder adduction    Shoulder internal rotation 4- 3-  Shoulder external rotation 4- 3-  Middle trapezius    Lower trapezius    Elbow flexion    Elbow extension    Wrist flexion    Wrist extension    Wrist ulnar deviation    Wrist radial deviation    Wrist pronation    Wrist supination    Grip strength (lbs) 23.3 lb 13.3 lb   (Blank rows = not tested, score listed is out of 5 possible points.  N = WNL, D = diminished, C = clear for gross weakness with myotome testing, * = concordant pain with testing)   SHOULDER SPECIAL TESTS: Spurling (+) for neck pain,not radicular  JOINT MOBILITY TESTING:  Hypomobile in Cervical spine  PALPATION:  TTP over left upper trap/supraspinatus, R shoulder global pain                                                                                                                             TREATMENT DATE: EVAL, issue HEP and TPDN Trigger Point Dry Needling STW over left shouder Gentle Grade II mobs inf/post  Initial Treatment: Pt instructed on Dry Needling rational, procedures, and possible side effects. Pt instructed to expect mild to moderate muscle soreness later in the day and/or into the next day.  Pt instructed in methods to reduce  muscle soreness. Pt instructed to continue prescribed HEP. Because Dry Needling was performed over or adjacent to a lung field, pt was educated on S/S of pneumothorax and to seek immediate medical attention should they occur.  Patient was educated on signs and symptoms of infection and other risk factors and advised to seek medical attention should they occur.  Patient verbalized understanding of these instructions and education.   Patient Verbal Consent Given: Yes Education Handout Provided: Yes Muscles Treated: Left UT/LS and infraspinatus Electrical Stimulation Performed: No Treatment Response/Outcome: twitch response and muscle tension decreased and Pain relief     PATIENT EDUCATION: Education details: POC, Explanation of findings, TPDN and issue HEP Person educated: Patient Education method: Explanation, Demonstration, Tactile cues, Verbal cues, and Handouts Education comprehension: verbalized understanding, returned demonstration, verbal cues required, tactile cues required, and needs further education  HOME EXERCISE PROGRAM: Access Code: [email or web address] URL: [email or web address] Date: 07/19/2023 Prepared by: Garen Lah  Exercises - Seated Gentle Upper Trapezius Stretch  - 1 x daily - 7 x  weekly - 1 sets - 3 reps - 30 hold - Gentle Levator Scapulae Stretch  - 1 x daily - 7 x weekly - 3 sets - 3 reps - 30 hold - Seated Shoulder Flexion Slide at Table Top with Forearm in Neutral  - 1 x daily - 7 x weekly - 3 sets - 10 reps - Seated Shoulder Scaption Slide at Table Top with Forearm in Neutral  - 1 x daily - 7 x weekly - 3 sets - 10 reps  ASSESSMENT:  CLINICAL IMPRESSION: Patient is a 89  y.o. female  who was seen today for physical therapy evaluation and treatment for  bil shoulder pain and presents with neck pain as well on left.  Order is for Right shoulder pain but Left shoulder is chief complaint and more restricted with also neck restrictions noted. Pt does  present with myofascial pain component and states doing exercises with resistance last time she was in PT was painful.  Pt would like to return to more comfortable movement in order to care for her home, shop for groceries and sleep with less disruptions.  PT would benefit from skilled PT to address impairments and pain issue and be able to live alone with maximum function  OBJECTIVE IMPAIRMENTS: decreased ROM, decreased strength, impaired UE functional use, postural dysfunction, and pain.   ACTIVITY LIMITATIONS: lifting, sleeping, bathing, dressing, reach over head, hygiene/grooming, and housework   PARTICIPATION LIMITATIONS: meal prep, cleaning, laundry, and shopping  PERSONAL FACTORS:  See medical history and pertinent history/ myofascial component  are also affecting patient's functional outcome.   REHAB POTENTIAL: Good  CLINICAL DECISION MAKING: Evolving/moderate complexity  EVALUATION COMPLEXITY: Moderate   GOALS: Goals reviewed with patient? Yes  SHORT TERM GOALS: Target date: 08-09-23  Pt will be independent with initial HEP 75% of time Baseline:no knowledge , not currently exercising Goal status: INITIAL  2.  Demonstrate understanding of proper sitting posture, body mechanics, work ergonomics, and be more conscious of position and posture throughout the day.  Baseline: limited knowledge Goal status: INITIAL  3. Pt will learn strategies for encouraging more restful sleep and pain relief for increased restorative sleep at night  Baseline  sleeping in recliner  Goal status INITIAL    LONG TERM GOALS: Target date: 08-30-23  Ms Durwin Glaze will be I with advanced resistant exercise Baseline: limited knowledge Goal status: INITIAL  2.  Pt will be able to reach overhead for kitchen dishes and cook ware in order to care for her needs living alone with minimal pain Baseline: EVAL -Pt restricted in motion bil UE Goal status: INITIAL  3.  Pt will be abel to reduced pain in Left  shoulder by 50% Baseline: Pt left shoulder pain 8/10 to 10/10 at eval Goal status: INITIAL  4.  Pt will be able to utilize pain management techniques and understand condition in order to manage at home with HEP Baseline:limited knowledge Goal status: INITIAL  5.  Pt will demonstrate grip strength within 2lb of contralateral limb in order to indicate improved tolerance/independence with tasks requiring manual dexterity, with less than 2pt increase in pain on NPS. Baseline: see grip strength Goal status: INITIAL  6.  Pt will score less than or equal to 36 on Quick DASH in order to indicate reduced levels of disability due to shoulder pain (MDC 16-20pts). Baseline: 56.8% Goal status: INITIAL  PLAN:  PT FREQUENCY: 1-2x/week  PT DURATION: 6 weeks  PLANNED INTERVENTIONS: 97164- PT Re-evaluation, 97110-Therapeutic exercises, 97530- Therapeutic activity,  16109- Neuromuscular re-education, [phone]- Self Care, 09811- Manual therapy, [id number]- Electrical stimulation (unattended), [phone]- Electrical stimulation (manual), [phone]- Ionotophoresis 4mg /ml Dexamethasone, Patient/Family education, Taping, Dry Needling, Joint mobilization, Spinal mobilization, Cryotherapy, and Moist heat  PLAN FOR NEXT SESSION: Review HEP and TPDN   Garen Lah, PT, ATRIC Certified Exercise Expert for the Aging Adult  07/19/23 10:59 AM Phone: [phone] Fax: [phone]

## 2023-07-19 ENCOUNTER — Ambulatory Visit: Attending: Family Medicine | Admitting: Physical Therapy

## 2023-07-19 ENCOUNTER — Other Ambulatory Visit: Payer: Self-pay

## 2023-07-19 DIAGNOSIS — M6281 Muscle weakness (generalized): Secondary | ICD-10-CM

## 2023-07-19 DIAGNOSIS — M542 Cervicalgia: Secondary | ICD-10-CM | POA: Diagnosis not present

## 2023-07-19 DIAGNOSIS — G8929 Other chronic pain: Secondary | ICD-10-CM

## 2023-07-19 DIAGNOSIS — M25511 Pain in right shoulder: Secondary | ICD-10-CM | POA: Diagnosis not present

## 2023-07-19 NOTE — Patient Instructions (Signed)
   Trigger Point Dry Needling  What is Trigger Point Dry Needling (DN)? DN is a physical therapy technique used to treat muscle pain and dysfunction. Specifically, DN helps deactivate muscle trigger points (muscle knots).  A thin filiform needle is used to penetrate the skin and stimulate the underlying trigger point. The goal is for a local twitch response (LTR) to occur and for the trigger point to relax. No medication of any kind is injected during the procedure.   What Does Trigger Point Dry Needling Feel Like?  The procedure feels different for each individual patient. Some patients report that they do not actually feel the needle enter the skin and overall the process is not painful. Very mild bleeding may occur. However, many patients feel a deep cramping in the muscle in which the needle was inserted. This is the local twitch response.   How Will I feel after the treatment? Soreness is normal, and the onset of soreness may not occur for a few hours. Typically this soreness does not last longer than two days.  Bruising is uncommon, however; ice can be used to decrease any possible bruising.  In rare cases feeling tired or nauseous after the treatment is normal. In addition, your symptoms may get worse before they get better, this period will typically not last longer than 24 hours.   What Can I do After My Treatment? Increase your hydration by drinking more water for the next 24 hours.  You may place ice or heat on the areas treated that have become sore, however, do not use heat on inflamed or bruised areas. Heat often brings more relief post needling. You can continue your regular activities, but vigorous activity is not recommended initially after the treatment for 24 hours. DN is best combined with other physical therapy such as strengthening, stretching, and other therapies.   What are the complications? While your therapist has had extensive training in minimizing the risks of  trigger point dry needling, it is important to understand the risks of any procedure.  Risks include bleeding, pain, fatigue, hematoma, infection, vertigo, nausea or nerve involvement. Monitor for any changes to your skin or sensation. Contact your therapist or MD with concerns.  A rare but serious complication is a pneumothorax over or near your middle and upper chest and back If you have dry needling in this area, monitor for the following symptoms: Shortness of breath on exertion and/or Difficulty taking a deep breath and/or Chest Pain and/or A dry cough If any of the above symptoms develop, please go to the nearest emergency room or call 911. Tell them you had dry needling over your thorax and report any symptoms you are having. Please follow-up with your treating therapist after you complete the medical evaluation.  Garen Lah, PT, ATRIC Certified Exercise Expert for the Aging Adult  07/19/23 10:05 AM Phone: [phone] Fax: [phone]

## 2023-07-24 ENCOUNTER — Encounter: Payer: Self-pay | Admitting: Family Medicine

## 2023-07-24 ENCOUNTER — Telehealth: Payer: Self-pay

## 2023-07-24 ENCOUNTER — Ambulatory Visit (INDEPENDENT_AMBULATORY_CARE_PROVIDER_SITE_OTHER): Payer: PPO | Admitting: Family Medicine

## 2023-07-24 VITALS — BP 140/82 | HR 75 | Temp 98.0°F | Ht 59.0 in | Wt 153.9 lb

## 2023-07-24 DIAGNOSIS — Z0000 Encounter for general adult medical examination without abnormal findings: Secondary | ICD-10-CM | POA: Diagnosis not present

## 2023-07-24 DIAGNOSIS — E7800 Pure hypercholesterolemia, unspecified: Secondary | ICD-10-CM

## 2023-07-24 MED ORDER — ROSUVASTATIN CALCIUM 5 MG PO TABS: 2.5000 mg | ORAL_TABLET | Freq: Every day | ORAL | 1 refills | Status: DC

## 2023-07-24 MED ORDER — EZETIMIBE 10 MG PO TABS: ORAL_TABLET | ORAL | 1 refills | Status: DC

## 2023-07-24 NOTE — Progress Notes (Signed)
 Complete physical exam  Patient: Robin [name]   DOB: [birth year]   89 y.o. Female  MRN: [id number]  Subjective:    Chief Complaint  Patient presents with   Medical Management of Chronic Issues   Patient requests handicapped placard due to hip problems    Robin [name] is a 89 y.o. female who presents today for a complete physical exam. She reports consuming a general diet. Exercise is limited by orthopedic condition(s): osteoarthritis of hips. She generally feels well. She reports sleeping difficulty sleeping due to shoulder pain. She does not have additional problems to discuss today.    Most recent fall risk assessment:    07/24/2023   10:28 AM  Fall Risk   Falls in the past year? 0  Number falls in past yr: 0  Injury with Fall? 0  Risk for fall due to : No Fall Risks  Follow up Falls evaluation completed     Most recent depression screenings:    01/05/2023    4:18 PM 01/01/2023    1:02 PM  PHQ 2/9 Scores  PHQ - 2 Score 0 0  PHQ- 9 Score 0 0    Vision:Within last year and goes April 1 for her annual visit and Dental: No current dental problems and Last dental visit: 2 years ago.  Patient Active Problem List   Diagnosis Date Noted   HYPERCHOLESTEROLEMIA 01/05/2023   Peripheral neuropathy 11/30/2022   Osteoporosis of femur without pathological fracture 06/24/2019   Dyspnea 02/03/2019   Asthma 02/03/2019   Vertigo 06/26/2017   Hyperparathyroidism (HCC) 08/24/2014   Slow transit constipation 01/13/2014   IBS (irritable bowel syndrome), GERD, hx colon polyps, chronic abd pain - followed by Dr. Jarold Motto in GI 06/11/2012   Osteopenia 08/29/2011   Vitamin D deficiency 08/29/2011   GERD (gastroesophageal reflux disease) 11/23/2010   Dyslipidemia 09/09/2007   Hypertension, essential, benign 09/09/2007      Patient Care Team: Karie Georges, MD as PCP - General (Family Medicine) Jens Som Madolyn Frieze, MD as PCP - Cardiology (Cardiology) Meryl Dare, MD  (Inactive) as Consulting Physician (Gastroenterology) Lewayne Bunting, MD as Consulting Physician (Cardiology) Janet Berlin, MD as Consulting Physician (Ophthalmology) Jerene Bears, MD as Consulting Physician (Gynecology) Verner Chol, [facility] (Inactive) as Pharmacist (Pharmacist)   Outpatient Medications Prior to Visit  Medication Sig   acetaminophen (TYLENOL) 500 MG tablet Take 500-1,000 mg by mouth every 6 (six) hours as needed for mild pain or headache.    AMBULATORY NON FORMULARY MEDICATION Medication Name: GI Cocktail - 90 ml 2% Lidocaine, 90 ml Dicyclomine 10mg /32ml, 270 ml Maalox - Take 5-10 ml every 6 hour as needed.   amitriptyline (ELAVIL) 25 MG tablet Take 1 tablet (25 mg total) by mouth at bedtime.   baclofen (LIORESAL) 10 MG tablet Take 5 mg by mouth 2 (two) times daily as needed.   carvedilol (COREG) 3.125 MG tablet TAKE 1 TABLET BY MOUTH TWICE DAILY WITH A MEAL   cetirizine (ZYRTEC) 10 MG tablet Take 10 mg by mouth daily.   Cholecalciferol 25 MCG (1000 UT) TBDP Take 1,000 Units by mouth daily.   clobetasol ointment (TEMOVATE) 0.05 % Apply to affected area twice weekly.   esomeprazole (NEXIUM) 40 MG capsule TAKE 1 CAPSULE BY MOUTH TWICE DAILY BEFORE A MEAL   furosemide (LASIX) 20 MG tablet 1 tablet by mouth every other day as needed for swelling   hydrALAZINE (APRESOLINE) 25 MG tablet TAKE 1/2 TABLET BY MOUTH TWICE DAILY  lactulose (CHRONULAC) 10 GM/15ML solution Take 15 mLs (10 g total) by mouth daily.   lidocaine (XYLOCAINE) 5 % ointment Apply 1 Application topically as directed.   loperamide (IMODIUM) 2 MG capsule Take 1 capsule (2 mg total) by mouth 4 (four) times daily as needed for diarrhea or loose stools.   magnesium oxide (MAG-OX) 400 (240 Mg) MG tablet Take 400 mg by mouth daily.   ondansetron (ZOFRAN) 4 MG tablet Take 1 tablet (4 mg total) by mouth every 8 (eight) hours as needed for nausea or vomiting.   spironolactone (ALDACTONE) 25 MG tablet TAKE 1 TABLET  BY MOUTH TWICE DAILY   [DISCONTINUED] ezetimibe (ZETIA) 10 MG tablet TAKE 1 TABLET BY MOUTH DAILY   [DISCONTINUED] rosuvastatin (CRESTOR) 5 MG tablet Take 0.5 tablets (2.5 mg total) by mouth daily.   No facility-administered medications prior to visit.    Review of Systems  HENT:  Negative for hearing loss.   Eyes:  Negative for blurred vision.  Respiratory:  Negative for shortness of breath.   Cardiovascular:  Negative for chest pain.  Gastrointestinal: Negative.   Genitourinary: Negative.   Musculoskeletal:  Negative for back pain.  Neurological:  Negative for headaches.  Psychiatric/Behavioral:  Negative for depression.        Objective:     BP (!) 140/82   Pulse 75   Temp 98 F (36.7 C) (Oral)   Ht 4\' 11"  (1.499 m)   Wt 153 lb 14.4 oz (69.8 kg)   SpO2 99%   BMI 31.08 kg/m     Physical Exam Vitals reviewed.  Constitutional:      Appearance: Normal appearance. She is well-groomed and normal weight.  HENT:     Right Ear: Tympanic membrane and ear canal normal.     Left Ear: Tympanic membrane and ear canal normal.     Mouth/Throat:     Mouth: Mucous membranes are moist.     Pharynx: No posterior oropharyngeal erythema.  Eyes:     Conjunctiva/sclera: Conjunctivae normal.  Neck:     Thyroid: No thyromegaly.  Cardiovascular:     Rate and Rhythm: Normal rate and regular rhythm.     Pulses: Normal pulses.     Heart sounds: S1 normal and S2 normal.  Pulmonary:     Effort: Pulmonary effort is normal.     Breath sounds: Normal breath sounds and air entry.  Abdominal:     General: Abdomen is flat. Bowel sounds are normal.     Palpations: Abdomen is soft.  Musculoskeletal:     Right lower leg: No edema.     Left lower leg: No edema.  Lymphadenopathy:     Cervical: No cervical adenopathy.  Neurological:     Mental Status: She is alert and oriented to person, place, and time. Mental status is at baseline.     Gait: Gait is intact.  Psychiatric:        Mood and  Affect: Mood and affect normal.        Speech: Speech normal.        Behavior: Behavior normal.        Judgment: Judgment normal.      No results found for any visits on 07/24/23.      Assessment & Plan:    Routine Health Maintenance and Physical Exam  Immunization History  Administered Date(s) Administered   Fluad Quad(high Dose 65+) 03/04/2019, 02/02/2020, 03/28/2021, 02/06/2022   Fluad Trivalent(High Dose 65+) 03/09/2023   Influenza, High Dose Seasonal  PF 03/23/2015, 02/22/2016, 02/13/2017, 03/11/2018   Influenza,inj,Quad PF,6+ Mos 03/07/2013, 03/03/2014   PFIZER(Purple Top)SARS-COV-2 Vaccination 06/21/2019, 07/12/2019, 03/30/2020, 12/03/2020   Pfizer(Comirnaty)Fall Seasonal Vaccine 12 years and older 02/28/2022   Pneumococcal Conjugate-13 08/10/2014   Pneumococcal Polysaccharide-23 07/23/2012   Tdap 07/23/2012   Zoster, Live 09/30/2013    Health Maintenance  Topic Date Due   COVID-19 Vaccine (6 - 2024-25 season) 01/14/2023   Zoster Vaccines- Shingrix (1 of 2) 10/24/2023 (Originally 12/28/1985)   DTaP/Tdap/Td (2 - Td or Tdap) 07/23/2024 (Originally 07/24/2022)   Medicare Annual Wellness (AWV)  01/05/2024   Pneumonia Vaccine 22+ Years old  Completed   INFLUENZA VACCINE  Completed   DEXA SCAN  Completed   HPV VACCINES  Aged Out    Discussed health benefits of physical activity, and encouraged her to engage in regular exercise appropriate for her age and condition.  Routine history and physical examination of adult      --Normal physical exam findings today, counseled patient on healthy sleep habits, also using OTC medications safely. Patient is not able to exercise regularly due to chronic OA. Encouraged exercise while sitting down. Reviewed last set of labs and also reviewed her health maintenance testing. She does have osteoporosis on the DEXA scan, will check to see which medications are covered for this. RTC in 6 months for regular follow up on HTN.    HYPERCHOLESTEROLEMIA -     Ezetimibe; TAKE 1 TABLET BY MOUTH DAILY  Dispense: 90 tablet; Refill: 1 -     Rosuvastatin Calcium; Take 0.5 tablets (2.5 mg total) by mouth daily.  Dispense: 45 tablet; Refill: 1    Return in about 6 months (around 01/24/2024) for HTN.     Karie Georges, MD

## 2023-07-24 NOTE — Telephone Encounter (Signed)
 Submitted for Prolia benefits per PCP request.

## 2023-07-24 NOTE — Patient Instructions (Signed)
 Diclofenac gel or Salonpas patches-- ok to use with heating pad

## 2023-07-26 ENCOUNTER — Encounter: Payer: Self-pay | Admitting: Physical Therapy

## 2023-07-26 ENCOUNTER — Ambulatory Visit: Admitting: Physical Therapy

## 2023-07-26 DIAGNOSIS — G8929 Other chronic pain: Secondary | ICD-10-CM

## 2023-07-26 DIAGNOSIS — M25511 Pain in right shoulder: Secondary | ICD-10-CM

## 2023-07-26 NOTE — Therapy (Signed)
 OUTPATIENT PHYSICAL THERAPY SHOULDER TREATMENT   Patient Name: Robin [name] MRN: [id number] DOB:1934/04/17, 89 y.o., female Today's Date: 07/26/2023  END OF SESSION:  PT End of Session - 07/26/23 1016     Visit Number 2    Number of Visits 13    Date for PT Re-Evaluation 08/30/23    Authorization Type [employer]    PT Start Time 1015    PT Stop Time 1105    PT Time Calculation (min) 50 min             Past Medical History:  Diagnosis Date   Abnormal Pap smear of cervix    Allergy    Arthritis    Asthma    Cataract 2018   bilateral eyes   Colon polyps    tubulovillous adenoma, 2010   External hemorrhoids without mention of complication    GERD (gastroesophageal reflux disease)    Headache    eval with neuro in 2011   Hyperlipemia    Hyperparathyroidism (HCC)    seeing Dr. Lucianne Muss in endocrinology   Hypertension    IBS (irritable bowel syndrome), GERD, hx colon polyps, chronic abd pain - followed by Dr. Jarold Motto in GI 06/11/2012   Osteoporosis 08/29/2011   Vertigo    eval with neuro in 2011, brief recurrence 2016   Past Surgical History:  Procedure Laterality Date   BACK SURGERY  1974   CARPAL TUNNEL RELEASE Left 06/2008   COLONOSCOPY     EYE SURGERY  07/2017   bilateral cataract extraction   POLYPECTOMY     TONSILLECTOMY     UPPER GASTROINTESTINAL ENDOSCOPY     Patient Active Problem List   Diagnosis Date Noted   HYPERCHOLESTEROLEMIA 01/05/2023   Peripheral neuropathy 11/30/2022   Osteoporosis of femur without pathological fracture 06/24/2019   Dyspnea 02/03/2019   Asthma 02/03/2019   Vertigo 06/26/2017   Hyperparathyroidism (HCC) 08/24/2014   Slow transit constipation 01/13/2014   IBS (irritable bowel syndrome), GERD, hx colon polyps, chronic abd pain - followed by Dr. Jarold Motto in GI 06/11/2012   Osteopenia 08/29/2011   Vitamin D deficiency 08/29/2011   GERD (gastroesophageal reflux disease) 11/23/2010   Dyslipidemia 09/09/2007    Hypertension, essential, benign 09/09/2007    PCP: Karie Georges, MD   REFERRING PROVIDER: Karie Georges, MD   REFERRING DIAG: M25.511 (ICD-10-CM) - Right shoulder pain, unspecified chronicity   THERAPY DIAG:  Acute pain of right shoulder  Chronic right shoulder pain  Rationale for Evaluation and Treatment: Rehabilitation  ONSET DATE: R shld pain  3 days ago, but left shoulder 3 weeks ago  SUBJECTIVE:  SUBJECTIVE STATEMENT: I am about the same. I do not think the exercises helped.    EVAL: I am here because I have pain in my left shoulder that started 3 weeks ago but I also have pain in my R shoulder that began 3 days ago.  I sleep on my R normally but now I sleep anyway I can and for a week I slept in a recliner. I am now back in  the bed and I sleep any way I can.  I can only sleep 2 hours as a time. I have trouble moving my neck as well.  I really have pain in my left shoulder. I wonder if my over using my right arm is causing pain in my left arm. I have about 8/10 pain in my left now.  Pain with any reaching activities.  Difficulty with bathing and clothing. Neck motion affects her pain radiating down arm Hand dominance: Right  PERTINENT HISTORY: HTN, back surgery 1975, carpal tunnel, vertigo, osteoporosis  PAIN:  Are you having pain? Yes: NPRS scale: R shld 0/10 at rest and at worst 4-5/10 . Left at rest  7/10 and at worst 10/10 Pain location: R shld and L shld Pain description: achy sharp Aggravating factors: any movement, sleeping, pulling up pants, carrying groceries Relieving factors: resting, hot shower, icy hot, biofreeze  PRECAUTIONS: None  RED FLAGS: None   WEIGHT BEARING RESTRICTIONS: No  FALLS:  Has patient fallen in last 6 months? 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  DIAGNOSTIC FINDINGS:    FINDINGS: Limited evaluation due to overlapping osseous structures and overlying soft tissues. Multilevel degenerative changes. Possible multilevel osseous neural foraminal stenosis with limited evaluation due to positioning. There is no evidence of cervical spine fracture or prevertebral soft tissue swelling. Alignment is normal. No other significant bone abnormalities are identified.   IMPRESSION: No acute displaced fracture or traumatic listhesis of the cervical spine. Limited evaluation due to overlapping osseous structures and overlying soft tissues.     Electronically Signed   By: Tish Frederickson M.D.   On: 10/08/2022 00:28  RIGHT SHOULDER - 2+ VIEW   COMPARISON:  None Available.   FINDINGS: Mild AC joint degenerative change. No fracture or dislocation. No other bony abnormalities.   IMPRESSION: Mild AC joint degenerative change.     Electronically Signed   By: Gerome Sam III M.D.   On: 10/05/2022 13:33 PATIENT SURVEYS:  Quick Dash 56.8%  COGNITION: Overall cognitive status: Within functional limits for tasks assessed     SENSATION: Pt with radiating pain from neck into Right shoulder  POSTURE: Posture forward head and flexed trunk / was sleeping in recliner and recently returned to bed   CERVICAL ROM:   Active ROM A/PROM (deg) eval  Flexion 35  Extension 15*  Right lateral flexion 22*  Left lateral flexion 23*  Right rotation 33 *  Left rotation 40   (Blank rows = not tested  N= WNL * concordant)  UPPER EXTREMITY ROM:   A ROM/PROM  Right eval Left eval Left  PROM  Shoulder flexion 115 95 neck upper shld pain 95  Shoulder extension     Shoulder abduction 80 Pain on attempt < 60 80  Shoulder adduction     Shoulder internal rotation Mid buttock To ischial tuberosity   Shoulder external rotation To neck  with pain To clavicle with pain   Elbow flexion     Elbow extension     Wrist flexion     Wrist extension     Wrist ulnar deviation     Wrist radial deviation     Wrist pronation     Wrist supination     (Blank rows = not tested)  UPPER EXTREMITY MMT:  MMT Right eval Left eval  Shoulder flexion 4- 3-  Shoulder extension    Shoulder abduction 3- 3-  Shoulder adduction    Shoulder internal rotation 4- 3-  Shoulder external rotation 4- 3-  Middle trapezius    Lower trapezius    Elbow flexion    Elbow extension    Wrist flexion    Wrist extension    Wrist ulnar deviation    Wrist radial deviation    Wrist pronation    Wrist supination    Grip strength (lbs) 23.3 lb 13.3 lb   (Blank rows = not tested, score listed is out of 5 possible points.  N = WNL, D = diminished, C = clear for gross weakness with myotome testing, * = concordant pain with testing)   SHOULDER SPECIAL TESTS: Spurling (+) for neck pain,not radicular  JOINT MOBILITY TESTING:  Hypomobile in Cervical spine  PALPATION:  TTP over left upper trap/supraspinatus, R shoulder global pain                                                                                                                             [facility] Adult PT Treatment:                                                DATE: 07/26/23 Therapeutic Activity: Seated Table Slides for shoulder flexion and scaption -heavy cues  Chest press 5 x 2 with dowel  Supine ER AAROM bilat with dowel  Seated review of upper trap and levator stretches - mod cues  Manual Therapy: Inferior and A/P glides  Modalities: HMP left shoulder x 8 minutes  Self Care: Rationale of Therex  progression, Avoidance of pain, use of modalities   TREATMENT DATE: EVAL, issue HEP and TPDN Trigger Point Dry Needling STW over left shouder Gentle Grade II mobs inf/post  Initial Treatment: Pt instructed on Dry Needling rational, procedures, and possible side effects. Pt instructed to expect mild to moderate muscle soreness later in the day and/or into the next day.  Pt instructed in methods to reduce muscle soreness. Pt instructed to continue prescribed HEP. Because Dry Needling was performed over or adjacent to a lung field, pt was  educated on S/S of pneumothorax and to seek immediate medical attention should they occur.  Patient was educated on signs and symptoms of infection and other risk factors and advised to seek medical attention should they occur.  Patient verbalized understanding of these instructions and education.   Patient Verbal Consent Given: Yes Education Handout Provided: Yes Muscles Treated: Left UT/LS and infraspinatus Electrical Stimulation Performed: No Treatment Response/Outcome: twitch response and muscle tension decreased and Pain relief     PATIENT EDUCATION: Education details: POC, Explanation of findings, TPDN and issue HEP Person educated: Patient Education method: Explanation, Demonstration, Tactile cues, Verbal cues, and Handouts Education comprehension: verbalized understanding, returned demonstration, verbal cues required, tactile cues required, and needs further education  HOME EXERCISE PROGRAM: Access Code: [email or web address] URL: [email or web address] Date: 07/19/2023 Prepared by: Garen Lah  Exercises - Seated Gentle Upper Trapezius Stretch  - 1 x daily - 7 x weekly - 1 sets - 3 reps - 30 hold - Gentle Levator Scapulae Stretch  - 1 x daily - 7 x weekly - 3 sets - 3 reps - 30 hold - Seated Shoulder Flexion Slide at Table Top with Forearm in Neutral  - 1 x daily - 7 x weekly - 3 sets - 10 reps - Seated Shoulder Scaption Slide at  Table Top with Forearm in Neutral  - 1 x daily - 7 x weekly - 3 sets - 10 reps  ASSESSMENT:  CLINICAL IMPRESSION: Pt reports she is about the same after attempting her HEP. She did need mod/max verbal cuing to complete HEP correctly. She does feel decreased neck tension after TPDN however no change in shoulder pain. Progressed with supine AAROM which was tolerated with mild increase in pain. Performed manual therapy to left shoulder for mobilizations to improve elevation. She has increased pain with end range PROM. No change to HEP, will assess response to this session next visit and update HEP.     EVAL: Patient is a 89  y.o. female  who was seen today for physical therapy evaluation and treatment for  bil shoulder pain and presents with neck pain as well on left.  Order is for Right shoulder pain but Left shoulder is chief complaint and more restricted with also neck restrictions noted. Pt does present with myofascial pain component and states doing exercises with resistance last time she was in PT was painful.  Pt would like to return to more comfortable movement in order to care for her home, shop for groceries and sleep with less disruptions.  PT would benefit from skilled PT to address impairments and pain issue and be able to live alone with maximum function  OBJECTIVE IMPAIRMENTS: decreased ROM, decreased strength, impaired UE functional use, postural dysfunction, and pain.   ACTIVITY LIMITATIONS: lifting, sleeping, bathing, dressing, reach over head, hygiene/grooming, and housework   PARTICIPATION LIMITATIONS: meal prep, cleaning, laundry, and shopping  PERSONAL FACTORS:  See medical history and pertinent history/ myofascial component  are also affecting patient's functional outcome.   REHAB POTENTIAL: Good  CLINICAL DECISION MAKING: Evolving/moderate complexity  EVALUATION COMPLEXITY: Moderate   GOALS: Goals reviewed with patient? Yes  SHORT TERM GOALS: Target date:  08-09-23  Pt will be independent with initial HEP 75% of time Baseline:no knowledge , not currently exercising 07/26/23: mod cues required  Goal status: ONGOING  2.  Demonstrate understanding of proper sitting posture, body mechanics, work ergonomics, and be more conscious of position and posture throughout the day.  Baseline: limited knowledge Goal status: INITIAL  3. Pt will learn strategies for encouraging more restful sleep and pain relief for increased restorative sleep at night  Baseline  sleeping in recliner  Goal status INITIAL    LONG TERM GOALS: Target date: 08-30-23  Ms Durwin Glaze will be I with advanced resistant exercise Baseline: limited knowledge Goal status: INITIAL  2.  Pt will be able to reach overhead for kitchen dishes and cook ware in order to care for her needs living alone with minimal pain Baseline: EVAL -Pt restricted in motion bil UE Goal status: INITIAL  3.  Pt will be abel to reduced pain in Left shoulder by 50% Baseline: Pt left shoulder pain 8/10 to 10/10 at eval Goal status: INITIAL  4.  Pt will be able to utilize pain management techniques and understand condition in order to manage at home with HEP Baseline:limited knowledge Goal status: INITIAL  5.  Pt will demonstrate grip strength within 2lb of contralateral limb in order to indicate improved tolerance/independence with tasks requiring manual dexterity, with less than 2pt increase in pain on NPS. Baseline: see grip strength Goal status: INITIAL  6.  Pt will score less than or equal to 36 on Quick DASH in order to indicate reduced levels of disability due to shoulder pain (MDC 16-20pts). Baseline: 56.8% Goal status: INITIAL  PLAN:  PT FREQUENCY: 1-2x/week  PT DURATION: 6 weeks  PLANNED INTERVENTIONS: 97164- PT Re-evaluation, 97110-Therapeutic exercises, 97530- Therapeutic activity, 97112- Neuromuscular re-education, 97535- Self Care, 16109- Manual therapy, [id number]- Electrical stimulation  (unattended), [phone]- Electrical stimulation (manual), [phone]- Ionotophoresis 4mg /ml Dexamethasone, Patient/Family education, Taping, Dry Needling, Joint mobilization, Spinal mobilization, Cryotherapy, and Moist heat  PLAN FOR NEXT SESSION: Review HEP and TPDN   Jannette Spanner, PTA 07/26/23 11:37 AM Phone: [phone] Fax: [phone]

## 2023-07-30 ENCOUNTER — Encounter: Payer: Self-pay | Admitting: Physical Therapy

## 2023-07-30 ENCOUNTER — Ambulatory Visit: Admitting: Physical Therapy

## 2023-07-30 DIAGNOSIS — G8929 Other chronic pain: Secondary | ICD-10-CM

## 2023-07-30 DIAGNOSIS — M25511 Pain in right shoulder: Secondary | ICD-10-CM | POA: Diagnosis not present

## 2023-07-30 DIAGNOSIS — M542 Cervicalgia: Secondary | ICD-10-CM

## 2023-07-30 DIAGNOSIS — M6281 Muscle weakness (generalized): Secondary | ICD-10-CM

## 2023-07-30 NOTE — Therapy (Signed)
 OUTPATIENT PHYSICAL THERAPY SHOULDER TREATMENT   Patient Name: Robin [name] MRN: [id number] DOB:19-Apr-1934, 89 y.o., female Today's Date: 07/30/2023  END OF SESSION:  PT End of Session - 07/30/23 1059     Visit Number 3    Number of Visits 13    Date for PT Re-Evaluation 08/30/23    Authorization Type [employer]    PT Start Time 1100    PT Stop Time 1144    PT Time Calculation (min) 44 min             Past Medical History:  Diagnosis Date   Abnormal Pap smear of cervix    Allergy    Arthritis    Asthma    Cataract 2018   bilateral eyes   Colon polyps    tubulovillous adenoma, 2010   External hemorrhoids without mention of complication    GERD (gastroesophageal reflux disease)    Headache    eval with neuro in 2011   Hyperlipemia    Hyperparathyroidism (HCC)    seeing Dr. Lucianne Muss in endocrinology   Hypertension    IBS (irritable bowel syndrome), GERD, hx colon polyps, chronic abd pain - followed by Dr. Jarold Motto in GI 06/11/2012   Osteoporosis 08/29/2011   Vertigo    eval with neuro in 2011, brief recurrence 2016   Past Surgical History:  Procedure Laterality Date   BACK SURGERY  1974   CARPAL TUNNEL RELEASE Left 06/2008   COLONOSCOPY     EYE SURGERY  07/2017   bilateral cataract extraction   POLYPECTOMY     TONSILLECTOMY     UPPER GASTROINTESTINAL ENDOSCOPY     Patient Active Problem List   Diagnosis Date Noted   HYPERCHOLESTEROLEMIA 01/05/2023   Peripheral neuropathy 11/30/2022   Osteoporosis of femur without pathological fracture 06/24/2019   Dyspnea 02/03/2019   Asthma 02/03/2019   Vertigo 06/26/2017   Hyperparathyroidism (HCC) 08/24/2014   Slow transit constipation 01/13/2014   IBS (irritable bowel syndrome), GERD, hx colon polyps, chronic abd pain - followed by Dr. Jarold Motto in GI 06/11/2012   Osteopenia 08/29/2011   Vitamin D deficiency 08/29/2011   GERD (gastroesophageal reflux disease) 11/23/2010   Dyslipidemia 09/09/2007    Hypertension, essential, benign 09/09/2007    PCP: Karie Georges, MD   REFERRING PROVIDER: Karie Georges, MD   REFERRING DIAG: M25.511 (ICD-10-CM) - Right shoulder pain, unspecified chronicity   THERAPY DIAG:  Acute pain of right shoulder  Chronic right shoulder pain  Cervicalgia  Muscle weakness  Rationale for Evaluation and Treatment: Rehabilitation  ONSET DATE: R shld pain  3 days ago, but left shoulder 3 weeks ago  SUBJECTIVE:  SUBJECTIVE STATEMENT: 07/30/23: I think I am doing better. But the shoulder still needs a lot of work.   07/26/23: I am about the same. I do not think the exercises helped.    EVAL: I am here because I have pain in my left shoulder that started 3 weeks ago but I also have pain in my R shoulder that began 3 days ago.  I sleep on my R normally but now I sleep anyway I can and for a week I slept in a recliner. I am now back in  the bed and I sleep any way I can.  I can only sleep 2 hours as a time. I have trouble moving my neck as well.  I really have pain in my left shoulder. I wonder if my over using my right arm is causing pain in my left arm. I have about 8/10 pain in my left now.  Pain with any reaching activities.  Difficulty with bathing and clothing. Neck motion affects her pain radiating down arm Hand dominance: Right  PERTINENT HISTORY: HTN, back surgery 1975, carpal tunnel, vertigo, osteoporosis  PAIN:  Are you having pain? Yes: NPRS scale: R shld 0/10 at rest and at worst 4-5/10 . Left at rest  5-6/10 and at worst 10/10 Pain location: R shld and L shld Pain description: achy sharp Aggravating factors: any movement, sleeping, pulling up pants, carrying groceries Relieving factors: resting, hot shower, icy hot, biofreeze  PRECAUTIONS: None  RED  FLAGS: None   WEIGHT BEARING RESTRICTIONS: No  FALLS:  Has patient fallen in last 6 months? 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  DIAGNOSTIC FINDINGS:    FINDINGS: Limited evaluation due to overlapping osseous structures and overlying soft tissues. Multilevel degenerative changes. Possible multilevel osseous neural foraminal stenosis with limited evaluation due to positioning. There is no evidence of cervical spine fracture or prevertebral soft tissue swelling. Alignment is normal. No other significant bone abnormalities are identified.   IMPRESSION: No acute displaced fracture or traumatic listhesis of the cervical spine. Limited evaluation due to overlapping osseous structures and overlying soft tissues.     Electronically Signed   By: Tish Frederickson M.D.   On: 10/08/2022 00:28  RIGHT SHOULDER - 2+ VIEW   COMPARISON:  None Available.   FINDINGS: Mild AC joint degenerative change. No fracture or dislocation. No other bony abnormalities.   IMPRESSION: Mild AC joint degenerative change.     Electronically Signed   By: Gerome Sam III M.D.   On: 10/05/2022 13:33 PATIENT SURVEYS:  Quick Dash 56.8%  COGNITION: Overall cognitive status: Within functional limits for tasks assessed     SENSATION: Pt with radiating pain from neck into Right shoulder  POSTURE: Posture forward head and flexed trunk / was sleeping in recliner and recently returned to bed   CERVICAL ROM:   Active ROM A/PROM (deg) eval  Flexion 35  Extension 15*  Right lateral flexion 22*  Left lateral  flexion 23*  Right rotation 33 *  Left rotation 40   (Blank rows = not tested  N= WNL * concordant)  UPPER EXTREMITY ROM:  A ROM/PROM Right eval Left eval Left  PROM 07/26/23 Left 07/30/23  Shoulder flexion 115 95 neck upper shld pain 95 AA 100 supine  Shoulder extension      Shoulder abduction 80 Pain on attempt < 60 80 Seated AA 90  Shoulder adduction      Shoulder internal rotation Mid buttock To ischial tuberosity    Shoulder external rotation To neck  with pain To clavicle with pain    Elbow flexion      Elbow extension      Wrist flexion      Wrist extension      Wrist ulnar deviation      Wrist radial deviation      Wrist pronation      Wrist supination      (Blank rows = not tested)  UPPER EXTREMITY MMT:  MMT Right eval Left eval  Shoulder flexion 4- 3-  Shoulder extension    Shoulder abduction 3- 3-  Shoulder adduction    Shoulder internal rotation 4- 3-  Shoulder external rotation 4- 3-  Middle trapezius    Lower trapezius    Elbow flexion    Elbow extension    Wrist flexion    Wrist extension    Wrist ulnar deviation    Wrist radial deviation    Wrist pronation    Wrist supination    Grip strength (lbs) 23.3 lb 13.3 lb   (Blank rows = not tested, score listed is out of 5 possible points.  N = WNL, D = diminished, C = clear for gross weakness with myotome testing, * = concordant pain with testing)   SHOULDER SPECIAL TESTS: Spurling (+) for neck pain,not radicular  JOINT MOBILITY TESTING:  Hypomobile in Cervical spine  PALPATION:  TTP over left upper trap/supraspinatus, R shoulder global pain   [facility] Adult PT Treatment:                                                DATE: 07/30/23 Therapeutic Activity: Seated table slides flexion- switched to foam roller x 10, thumbs in , thumbs up Seated table slides for abduction switched to foam roller x 10 each  Seated scap retract -thigh slides x 8- cues for comfortable ROM Supine chest press with dowel x  10  Supine pullover from chest height x 6  Supine horiz abdct- addct with dowel x 10 Supine ER AAROM with dowel- elbows supported with rolled towels 10 x 2  Updated HEP  Self Care: Positioning suggestions and demonstrations for sleep in bed. Pt currently sleeping in recliner but did recently get a new mattress                                                                                                                               [facility]  Adult PT Treatment:                                                DATE: 07/26/23 Therapeutic Activity: Seated Table Slides for shoulder flexion and scaption -heavy cues  Chest press 5 x 2 with dowel  Supine ER AAROM bilat with dowel  Seated review of upper trap and levator stretches - mod cues  Manual Therapy: Inferior and A/P glides  Modalities: HMP left shoulder x 8 minutes  Self Care: Rationale of Therex progression, Avoidance of pain, use of modalities   TREATMENT DATE: EVAL, issue HEP and TPDN Trigger Point Dry Needling STW over left shouder Gentle Grade II mobs inf/post  Initial Treatment: Pt instructed on Dry Needling rational, procedures, and possible side effects. Pt instructed to expect mild to moderate muscle soreness later in the day and/or into the next day.  Pt instructed in methods to reduce muscle soreness. Pt instructed to continue prescribed HEP. Because Dry Needling was performed over or adjacent to a lung field, pt was educated on S/S of pneumothorax and to seek immediate medical attention should they occur.  Patient was educated on signs and symptoms of infection and other risk factors and advised to seek medical attention should they occur.  Patient verbalized understanding of these instructions and education.   Patient Verbal Consent Given: Yes Education Handout Provided: Yes Muscles Treated: Left UT/LS and infraspinatus Electrical Stimulation Performed: No Treatment Response/Outcome: twitch response and muscle  tension decreased and Pain relief     PATIENT EDUCATION: Education details: POC, Explanation of findings, TPDN and issue HEP Person educated: Patient Education method: Explanation, Demonstration, Tactile cues, Verbal cues, and Handouts Education comprehension: verbalized understanding, returned demonstration, verbal cues required, tactile cues required, and needs further education  HOME EXERCISE PROGRAM: Access Code: [email or web address] URL: [email or web address] Date: 07/19/2023 Prepared by: Garen Lah  Exercises - Seated Gentle Upper Trapezius Stretch  - 1 x daily - 7 x weekly - 1 sets - 3 reps - 30 hold - Gentle Levator Scapulae Stretch  - 1 x daily - 7 x weekly - 3 sets - 3 reps - 30 hold - Seated Shoulder Flexion Slide at Table Top with Forearm in Neutral  - 1 x daily - 7 x weekly - 3 sets - 10 reps - Seated Shoulder Scaption Slide at Table Top with Forearm in Neutral  - 1 x daily - 7 x weekly - 3 sets - 10 reps 07/30/23 - Supine chest press  - 1 x daily - 7 x weekly - 1-2 sets - 10 reps - Supine shoulder Flexion Extension AAROM with Dowel to comfortable height overhead  - 1 x daily - 7 x weekly - 1-2 sets - 10 reps - Supine Shoulder External Rotation with Dowel  - 1 x daily - 7 x weekly - 1-2 sets - 10 reps  ASSESSMENT:  CLINICAL IMPRESSION: Pt reports she is a little better however still very limited with left shoulder. Sleep positions were demonstrated as pt is still sleeping in her recliner. Continued with progression of HEP and pt demonstrates improved AAROM for flexion and abduction. Seated table slides also tolerated better with assist from foam roller. She was given an updated HEP for supine AAROM.  Will review next session and progress as tolerated.     EVAL: Patient is a 89  y.o. female  who was seen today for physical therapy evaluation and treatment for  bil shoulder pain and presents with neck pain as well on left.  Order is for Right shoulder pain but Left  shoulder is chief complaint and more restricted with also neck restrictions noted. Pt does present with myofascial pain component and states doing exercises with resistance last time she was in PT was painful.  Pt would like to return to more comfortable movement in order to care for her home, shop for groceries and sleep with less disruptions.  PT would benefit from skilled PT to address impairments and pain issue and be able to live alone with maximum function  OBJECTIVE IMPAIRMENTS: decreased ROM, decreased strength, impaired UE functional use, postural dysfunction, and pain.   ACTIVITY LIMITATIONS: lifting, sleeping, bathing, dressing, reach over head, hygiene/grooming, and housework   PARTICIPATION LIMITATIONS: meal prep, cleaning, laundry, and shopping  PERSONAL FACTORS:  See medical history and pertinent history/ myofascial component  are also affecting patient's functional outcome.   REHAB POTENTIAL: Good  CLINICAL DECISION MAKING: Evolving/moderate complexity  EVALUATION COMPLEXITY: Moderate   GOALS: Goals reviewed with patient? Yes  SHORT TERM GOALS: Target date: 08-09-23  Pt will be independent with initial HEP 75% of time Baseline:no knowledge , not currently exercising 07/26/23: mod cues required  Goal status: ONGOING  2.  Demonstrate understanding of proper sitting posture, body mechanics, work ergonomics, and be more conscious of position and posture throughout the day.  Baseline: limited knowledge Goal status: INITIAL  3. Pt will learn strategies for encouraging more restful sleep and pain relief for increased restorative sleep at night  Baseline  sleeping in recliner  Goal status INITIAL    LONG TERM GOALS: Target date: 08-30-23  Ms Durwin Glaze will be I with advanced resistant exercise Baseline: limited knowledge Goal status: INITIAL  2.  Pt will be able to reach overhead for kitchen dishes and cook ware in order to care for her needs living alone with minimal  pain Baseline: EVAL -Pt restricted in motion bil UE Goal status: INITIAL  3.  Pt will be abel to reduced pain in Left shoulder by 50% Baseline: Pt left shoulder pain 8/10 to 10/10 at eval Goal status: INITIAL  4.  Pt will be able to utilize pain management techniques and understand condition in order to manage at home with HEP Baseline:limited knowledge Goal status: INITIAL  5.  Pt will demonstrate grip strength within 2lb of contralateral limb in order to indicate improved tolerance/independence with tasks requiring manual dexterity, with less than 2pt increase in pain on NPS. Baseline: see grip strength Goal status: INITIAL  6.  Pt will score less than or equal to 36 on Quick DASH in order to indicate reduced levels of disability due to shoulder pain (MDC 16-20pts). Baseline: 56.8% Goal status: INITIAL  PLAN:  PT FREQUENCY: 1-2x/week  PT DURATION: 6 weeks  PLANNED INTERVENTIONS: 97164- PT Re-evaluation, 97110-Therapeutic exercises, 97530- Therapeutic activity, 97112- Neuromuscular re-education, 97535- Self Care, 40981- Manual therapy, [id number]- Electrical stimulation (unattended), [phone]- Electrical stimulation (manual), [phone]- Ionotophoresis 4mg /ml Dexamethasone, Patient/Family education, Taping, Dry Needling, Joint mobilization, Spinal mobilization, Cryotherapy, and Moist heat  PLAN FOR NEXT SESSION: Review HEP and TPDN   Jannette Spanner, PTA 07/30/23 11:55 AM Phone: [phone] Fax: [phone]

## 2023-07-31 ENCOUNTER — Encounter: Payer: Self-pay | Admitting: Obstetrics and Gynecology

## 2023-07-31 ENCOUNTER — Ambulatory Visit (INDEPENDENT_AMBULATORY_CARE_PROVIDER_SITE_OTHER): Payer: PPO | Admitting: Obstetrics and Gynecology

## 2023-07-31 ENCOUNTER — Other Ambulatory Visit (HOSPITAL_COMMUNITY)
Admission: RE | Admit: 2023-07-31 | Discharge: 2023-07-31 | Disposition: A | Discharge status: Home / Self Care | Source: Ambulatory Visit | Attending: Obstetrics and Gynecology | Admitting: Obstetrics and Gynecology

## 2023-07-31 VITALS — BP 134/88 | HR 73 | Ht 61.5 in | Wt 157.0 lb

## 2023-07-31 DIAGNOSIS — L900 Lichen sclerosus et atrophicus: Secondary | ICD-10-CM | POA: Diagnosis not present

## 2023-07-31 DIAGNOSIS — N898 Other specified noninflammatory disorders of vagina: Secondary | ICD-10-CM

## 2023-07-31 DIAGNOSIS — Z01419 Encounter for gynecological examination (general) (routine) without abnormal findings: Secondary | ICD-10-CM

## 2023-07-31 MED ORDER — CLOBETASOL PROPIONATE 0.05 % EX OINT: TOPICAL_OINTMENT | CUTANEOUS | 0 refills | Status: AC

## 2023-07-31 NOTE — Patient Instructions (Signed)

## 2023-07-31 NOTE — Progress Notes (Signed)
 HPI: FU dizziness and hypertension. ABIs August 2010 normal. Stress echo February 2014 normal. MRI July 2018 showed chronic small vessel ischemic changes in the cerebral white matter and pons. MRA without significant vertebrobasilar disease. CTA showed normal circle of Willis. EEG also normal. Patient has chronic mild dizziness. CTA September 2020 showed no pulmonary embolus. There was note of coronary artery disease/aortic atherosclerosis. Monitor January 2023 showed sinus rhythm with PACs, brief PAT, PVCs and rare couplet.  Echocardiogram October 2024 showed ejection fraction 70 to 75%, mild left ventricular hypertrophy, grade 1 diastolic dysfunction, trace aortic insufficiency.  Since last seen, she denies dyspnea, chest pain, palpitations or syncope.  Current Outpatient Medications  Medication Sig Dispense Refill   acetaminophen (TYLENOL) 500 MG tablet Take 500-1,000 mg by mouth every 6 (six) hours as needed for mild pain or headache.      AMBULATORY NON FORMULARY MEDICATION Medication Name: GI Cocktail - 90 ml 2% Lidocaine, 90 ml Dicyclomine 10mg /69ml, 270 ml Maalox - Take 5-10 ml every 6 hour as needed. 450 mL 3   amitriptyline (ELAVIL) 25 MG tablet Take 1 tablet (25 mg total) by mouth at bedtime. 30 tablet 2   baclofen (LIORESAL) 10 MG tablet Take 5 mg by mouth 2 (two) times daily as needed.     carvedilol (COREG) 3.125 MG tablet TAKE 1 TABLET BY MOUTH TWICE DAILY WITH A MEAL 180 tablet 3   cetirizine (ZYRTEC) 10 MG tablet Take 10 mg by mouth daily.     Cholecalciferol 25 MCG (1000 UT) TBDP Take 1,000 Units by mouth daily. 90 tablet 3   clobetasol ointment (TEMOVATE) 0.05 % Apply to affected area sparingly at night twice weekly as needed. 60 g 0   esomeprazole (NEXIUM) 40 MG capsule TAKE 1 CAPSULE BY MOUTH TWICE DAILY BEFORE A MEAL 180 capsule 1   ezetimibe (ZETIA) 10 MG tablet TAKE 1 TABLET BY MOUTH DAILY 90 tablet 1   furosemide (LASIX) 20 MG tablet 1 tablet by mouth every other day as  needed for swelling 45 tablet 3   hydrALAZINE (APRESOLINE) 25 MG tablet TAKE 1/2 TABLET BY MOUTH TWICE DAILY 90 tablet 3   lactulose (CHRONULAC) 10 GM/15ML solution Take 15 mLs (10 g total) by mouth daily. 473 mL 3   lidocaine (XYLOCAINE) 5 % ointment Apply 1 Application topically as directed.     loperamide (IMODIUM) 2 MG capsule Take 1 capsule (2 mg total) by mouth 4 (four) times daily as needed for diarrhea or loose stools. 12 capsule 0   magnesium oxide (MAG-OX) 400 (240 Mg) MG tablet Take 400 mg by mouth daily.     ondansetron (ZOFRAN) 4 MG tablet Take 1 tablet (4 mg total) by mouth every 8 (eight) hours as needed for nausea or vomiting. 30 tablet 5   rosuvastatin (CRESTOR) 5 MG tablet Take 0.5 tablets (2.5 mg total) by mouth daily. 45 tablet 1   spironolactone (ALDACTONE) 25 MG tablet TAKE 1 TABLET BY MOUTH TWICE DAILY 180 tablet 3   No current facility-administered medications for this visit.     Past Medical History:  Diagnosis Date   Abnormal Pap smear of cervix    Allergy    Arthritis    Asthma    Cataract 2018   bilateral eyes   Colon polyps    tubulovillous adenoma, 2010   External hemorrhoids without mention of complication    GERD (gastroesophageal reflux disease)    Headache    eval with neuro in 2011  Hyperlipemia    Hyperparathyroidism (HCC)    seeing Dr. Lucianne Muss in endocrinology   Hypertension    IBS (irritable bowel syndrome), GERD, hx colon polyps, chronic abd pain - followed by Dr. Jarold Motto in GI 06/11/2012   Osteoporosis 08/29/2011   Vertigo    eval with neuro in 2011, brief recurrence 2016    Past Surgical History:  Procedure Laterality Date   BACK SURGERY  1974   CARPAL TUNNEL RELEASE Left 06/2008   COLONOSCOPY     EYE SURGERY  07/2017   bilateral cataract extraction   POLYPECTOMY     TONSILLECTOMY     UPPER GASTROINTESTINAL ENDOSCOPY      Social History   Socioeconomic History   Marital status: Widowed    Spouse name: Not on file   Number  of children: 2   Years of education: Not on file   Highest education level: Not on file  Occupational History   Occupation: Engineer, structural    Comment: retired    Associate Professor: RETIRED  Tobacco Use   Smoking status: Never   Smokeless tobacco: Never  Vaping Use   Vaping status: Never Used  Substance and Sexual Activity   Alcohol use: No    Alcohol/week: 0.0 standard drinks of alcohol   Drug use: No   Sexual activity: Not Currently    Birth control/protection: Post-menopausal    Comment: GCG MCR LR, less than 5, after 18, no abnormal pap, no std, no des  Other Topics Concern   Not on file  Social History Narrative   Lives alone in a one story home.  Has 2 children, one son and one daughter with 2 grandkids and 1 great-grandchild.    Retired from working for a bank.     Education: high school.    Social Drivers of Corporate investment banker Strain: Low Risk  (01/05/2023)   Overall Financial Resource Strain (CARDIA)    Difficulty of Paying Living Expenses: Not hard at all  Food Insecurity: No Food Insecurity (01/05/2023)   Hunger Vital Sign    Worried About Running Out of Food in the Last Year: Never true    Ran Out of Food in the Last Year: Never true  Transportation Needs: No Transportation Needs (01/05/2023)   PRAPARE - Administrator, Civil Service (Medical): No    Lack of Transportation (Non-Medical): No  Physical Activity: Insufficiently Active (01/05/2023)   Exercise Vital Sign    Days of Exercise per Week: 3 days    Minutes of Exercise per Session: 20 min  Stress: No Stress Concern Present (01/05/2023)   [name] of Occupational Health - Occupational Stress Questionnaire    Feeling of Stress : Not at all  Social Connections: Socially Isolated (01/05/2023)   Social Connection and Isolation Panel [NHANES]    Frequency of Communication with Friends and Family: More than three times a week    Frequency of Social Gatherings with Friends and Family: More than  three times a week    Attends Religious Services: Never    Database administrator or Organizations: No    Attends Banker Meetings: Never    Marital Status: Widowed  Intimate Partner Violence: Not At Risk (01/05/2023)   Humiliation, Afraid, Rape, and Kick questionnaire    Fear of Current or Ex-Partner: No    Emotionally Abused: No    Physically Abused: No    Sexually Abused: No    Family History  Problem Relation Age  of Onset   CAD Mother        Died of MI at age 30   Hypertension Mother    Hypertension Father    Stroke Father    Diabetes Sister    Hypertension Sister    Heart disease Sister        heart failure   Stroke Sister 55   Hypertension Sister    Diabetes Brother    Hypertension Brother    Stroke Brother 79   CAD Brother        Died of MI at age 44   Hypertension Brother    Stroke Brother 60   Hypertension Brother    Breast cancer Niece    Colon cancer Neg Hx    Colon polyps Neg Hx    Esophageal cancer Neg Hx    Pancreatic cancer Neg Hx    Stomach cancer Neg Hx    Rectal cancer Neg Hx     ROS: no fevers or chills, productive cough, hemoptysis, dysphasia, odynophagia, melena, hematochezia, dysuria, hematuria, rash, seizure activity, orthopnea, PND, pedal edema, claudication. Remaining systems are negative.  Physical Exam: Well-developed well-nourished in no acute distress.  Skin is warm and dry.  HEENT is normal.  Neck is supple.  Chest is clear to auscultation with normal expansion.  Cardiovascular exam is regular rate and rhythm.  2/6 systolic ejection murmur. Abdominal exam nontender or distended. No masses palpated. Extremities show no edema. neuro grossly intact  A/P  1 hypertension-patient has had multiple medication intolerances.  She states her blood pressure at home is typically controlled with systolic less than 140 and diastolic less than 85.  Will continue present medical regimen and follow-up.  2 history of question mild  aortic stenosis-not evident on most recent echocardiogram.  3 hyperlipidemia-continue Zetia and Crestor.  4 coronary calcification-no chest pain.  Continue statin.  5 chronic dizziness-previous cardiac workup unrevealing.  Her symptoms have not changed.  6 history of syncope-no recurrences since previous office visit.  Olga Millers, MD

## 2023-08-01 ENCOUNTER — Encounter: Payer: Self-pay | Admitting: Physical Therapy

## 2023-08-01 ENCOUNTER — Encounter: Payer: Self-pay | Admitting: Obstetrics and Gynecology

## 2023-08-01 ENCOUNTER — Ambulatory Visit: Admitting: Physical Therapy

## 2023-08-01 DIAGNOSIS — M25511 Pain in right shoulder: Secondary | ICD-10-CM

## 2023-08-01 DIAGNOSIS — G8929 Other chronic pain: Secondary | ICD-10-CM

## 2023-08-01 LAB — CERVICOVAGINAL ANCILLARY ONLY
Bacterial Vaginitis (gardnerella): NEGATIVE
Candida Glabrata: NEGATIVE
Candida Vaginitis: NEGATIVE
Comment: NEGATIVE
Comment: NEGATIVE
Comment: NEGATIVE
Comment: NEGATIVE
Trichomonas: NEGATIVE

## 2023-08-01 NOTE — Therapy (Signed)
 OUTPATIENT PHYSICAL THERAPY SHOULDER TREATMENT   Patient Name: Robin [name] MRN: [id number] DOB:03-Apr-1934, 89 y.o., female Today's Date: 08/01/2023  END OF SESSION:  PT End of Session - 08/01/23 1018     Visit Number 4    Number of Visits 13    Date for PT Re-Evaluation 08/30/23    Authorization Type [employer]    PT Start Time 1016    PT Stop Time 1110    PT Time Calculation (min) 54 min             Past Medical History:  Diagnosis Date   Abnormal Pap smear of cervix    Allergy    Arthritis    Asthma    Cataract 2018   bilateral eyes   Colon polyps    tubulovillous adenoma, 2010   External hemorrhoids without mention of complication    GERD (gastroesophageal reflux disease)    Headache    eval with neuro in 2011   Hyperlipemia    Hyperparathyroidism (HCC)    seeing Dr. Lucianne Muss in endocrinology   Hypertension    IBS (irritable bowel syndrome), GERD, hx colon polyps, chronic abd pain - followed by Dr. Jarold Motto in GI 06/11/2012   Osteoporosis 08/29/2011   Vertigo    eval with neuro in 2011, brief recurrence 2016   Past Surgical History:  Procedure Laterality Date   BACK SURGERY  1974   CARPAL TUNNEL RELEASE Left 06/2008   COLONOSCOPY     EYE SURGERY  07/2017   bilateral cataract extraction   POLYPECTOMY     TONSILLECTOMY     UPPER GASTROINTESTINAL ENDOSCOPY     Patient Active Problem List   Diagnosis Date Noted   HYPERCHOLESTEROLEMIA 01/05/2023   Peripheral neuropathy 11/30/2022   Osteoporosis of femur without pathological fracture 06/24/2019   Dyspnea 02/03/2019   Asthma 02/03/2019   Vertigo 06/26/2017   Hyperparathyroidism (HCC) 08/24/2014   Slow transit constipation 01/13/2014   IBS (irritable bowel syndrome), GERD, hx colon polyps, chronic abd pain - followed by Dr. Jarold Motto in GI 06/11/2012   Osteopenia 08/29/2011   Vitamin D deficiency 08/29/2011   GERD (gastroesophageal reflux disease) 11/23/2010   Dyslipidemia 09/09/2007    Hypertension, essential, benign 09/09/2007    PCP: Karie Georges, MD   REFERRING PROVIDER: Karie Georges, MD   REFERRING DIAG: M25.511 (ICD-10-CM) - Right shoulder pain, unspecified chronicity   THERAPY DIAG:  Acute pain of right shoulder  Chronic right shoulder pain  Rationale for Evaluation and Treatment: Rehabilitation  ONSET DATE: R shld pain  3 days ago, but left shoulder 3 weeks ago  SUBJECTIVE:  SUBJECTIVE STATEMENT: 08/01/23: Pain is 5/10. Found an axe handle to complete the exercises with.   07/30/23: I think I am doing better. But the shoulder still needs a lot of work.   07/26/23: I am about the same. I do not think the exercises helped.    EVAL: I am here because I have pain in my left shoulder that started 3 weeks ago but I also have pain in my R shoulder that began 3 days ago.  I sleep on my R normally but now I sleep anyway I can and for a week I slept in a recliner. I am now back in  the bed and I sleep any way I can.  I can only sleep 2 hours as a time. I have trouble moving my neck as well.  I really have pain in my left shoulder. I wonder if my over using my right arm is causing pain in my left arm. I have about 8/10 pain in my left now.  Pain with any reaching activities.  Difficulty with bathing and clothing. Neck motion affects her pain radiating down arm Hand dominance: Right  PERTINENT HISTORY: HTN, back surgery 1975, carpal tunnel, vertigo, osteoporosis  PAIN:  Are you having pain? Yes: NPRS scale: R shld 0/10 at rest and at worst 4-5/10 . Left at rest  5-6/10 and at worst 10/10 Pain location: R shld and L shld Pain description: achy sharp Aggravating factors: any movement, sleeping, pulling up pants, carrying groceries Relieving factors: resting, hot shower, icy hot,  biofreeze  PRECAUTIONS: None  RED FLAGS: None   WEIGHT BEARING RESTRICTIONS: No  FALLS:  Has patient fallen in last 6 months? 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  DIAGNOSTIC FINDINGS:    FINDINGS: Limited evaluation due to overlapping osseous structures and overlying soft tissues. Multilevel degenerative changes. Possible multilevel osseous neural foraminal stenosis with limited evaluation due to positioning. There is no evidence of cervical spine fracture or prevertebral soft tissue swelling. Alignment is normal. No other significant bone abnormalities are identified.   IMPRESSION: No acute displaced fracture or traumatic listhesis of the cervical spine. Limited evaluation due to overlapping osseous structures and overlying soft tissues.     Electronically Signed   By: Tish Frederickson M.D.   On: 10/08/2022 00:28  RIGHT SHOULDER - 2+ VIEW   COMPARISON:  None Available.   FINDINGS: Mild AC joint degenerative change. No fracture or dislocation. No other bony abnormalities.   IMPRESSION: Mild AC joint degenerative change.     Electronically Signed   By: Gerome Sam III M.D.   On: 10/05/2022 13:33 PATIENT SURVEYS:  Quick Dash 56.8%  COGNITION: Overall cognitive status: Within functional limits for tasks assessed     SENSATION: Pt with radiating pain from neck into Right shoulder  POSTURE: Posture forward head and flexed trunk / was sleeping in recliner and recently returned to bed   CERVICAL ROM:   Active ROM A/PROM (deg) eval  Flexion 35  Extension 15*  Right  lateral flexion 22*  Left lateral flexion 23*  Right rotation 33 *  Left rotation 40   (Blank  rows = not tested  N= WNL * concordant)  UPPER EXTREMITY ROM:   A ROM/PROM Right eval Left eval Left  PROM 07/26/23 Left 07/30/23 Right 08/01/23 AROM Left AROM 08/01/23  Shoulder flexion 115 95 neck upper shld pain 95 AA 100 supine 120 115 AA supine   Shoulder extension        Shoulder abduction 80 Pain on attempt < 60 80 Seated AA 90 100 Scaption 90   Shoulder adduction        Shoulder internal rotation Mid buttock To ischial tuberosity      Shoulder external rotation To neck  with pain To clavicle with pain      Elbow flexion        Elbow extension        Wrist flexion        Wrist extension        Wrist ulnar deviation        Wrist radial deviation        Wrist pronation        Wrist supination        (Blank rows = not tested)  UPPER EXTREMITY MMT:  MMT Right eval Left eval  Shoulder flexion 4- 3-  Shoulder extension    Shoulder abduction 3- 3-  Shoulder adduction    Shoulder internal rotation 4- 3-  Shoulder external rotation 4- 3-  Middle trapezius    Lower trapezius    Elbow flexion    Elbow extension    Wrist flexion    Wrist extension    Wrist ulnar deviation    Wrist radial deviation    Wrist pronation    Wrist supination    Grip strength (lbs) 23.3 lb 13.3 lb   (Blank rows = not tested, score listed is out of 5 possible points.  N = WNL, D = diminished, C = clear for gross weakness with myotome testing, * = concordant pain with testing)   SHOULDER SPECIAL TESTS: Spurling (+) for neck pain,not radicular  JOINT MOBILITY TESTING:  Hypomobile in Cervical spine  PALPATION:  TTP over left upper trap/supraspinatus, R shoulder global pain   [facility] Adult PT Treatment:                                                DATE: 08/01/23 Therapeutic Activity: Seated table slides flexion- switched to foam roller x 10, thumbs in , thumbs up Seated table slides for  abduction switched to foam roller x 10 each  Seated scap retract -thigh slides x 8- cues for comfortable ROM Supine chest press with dowel x 10  Supine pullover from chest height x 6 -115 degrees left  Supine protraction with dowel  Supine ER AAROM with dowel- elbows supported with rolled towels 10 x 2  Modalities:  Moist heat to left shoulder x 10 min   [facility] Adult PT Treatment:                                                DATE: 07/30/23 Therapeutic Activity: Seated table slides flexion- switched to foam roller x 10, thumbs in , thumbs up Seated table slides for abduction switched to foam roller x 10 each  Seated scap retract -  thigh slides x 8- cues for comfortable ROM Supine chest press with dowel x 10  Supine pullover from chest height x 6  Supine horiz abdct- addct with dowel x 10 Supine ER AAROM with dowel- elbows supported with rolled towels 10 x 2  Updated HEP  Self Care: Positioning suggestions and demonstrations for sleep in bed. Pt currently sleeping in recliner but did recently get a new mattress                                                                                                                               [facility] Adult PT Treatment:                                                DATE: 07/26/23 Therapeutic Activity: Seated Table Slides for shoulder flexion and scaption -heavy cues  Chest press 5 x 2 with dowel  Supine ER AAROM bilat with dowel  Seated review of upper trap and levator stretches - mod cues  Manual Therapy: Inferior and A/P glides  Modalities: HMP left shoulder x 8 minutes  Self Care: Rationale of Therex progression, Avoidance of pain, use of modalities       PATIENT EDUCATION: Education details: POC, Explanation of findings, TPDN and issue HEP Person educated: Patient Education method: Explanation, Demonstration, Tactile cues, Verbal cues, and Handouts Education comprehension: verbalized understanding, returned demonstration, verbal  cues required, tactile cues required, and needs further education  HOME EXERCISE PROGRAM: Access Code: [email or web address] URL: [email or web address] Date: 07/19/2023 Prepared by: Garen Lah  Exercises - Seated Gentle Upper Trapezius Stretch  - 1 x daily - 7 x weekly - 1 sets - 3 reps - 30 hold - Gentle Levator Scapulae Stretch  - 1 x daily - 7 x weekly - 3 sets - 3 reps - 30 hold - Seated Shoulder Flexion Slide at Table Top with Forearm in Neutral  - 1 x daily - 7 x weekly - 3 sets - 10 reps - Seated Shoulder Scaption Slide at Table Top with Forearm in Neutral  - 1 x daily - 7 x weekly - 3 sets - 10 reps 07/30/23 - Supine chest press  - 1 x daily - 7 x weekly - 1-2 sets - 10 reps - Supine shoulder Flexion Extension AAROM with Dowel to comfortable height overhead  - 1 x daily - 7 x weekly - 1-2 sets - 10 reps - Supine Shoulder External Rotation with Dowel  - 1 x daily - 7 x weekly - 1-2 sets - 10 reps  ASSESSMENT:  CLINICAL IMPRESSION: Pt reports frustration with slow progress with left shoulder. Pain on left is highly irritable although she is showing improvement in ROM and tolerance to therapeutic activities. She does not tolerate manual therapy due to high irritability and difficulty relaxing today. No updates  to HEP. She will continue with current HEP.     EVAL: Patient is a 89  y.o. female  who was seen today for physical therapy evaluation and treatment for  bil shoulder pain and presents with neck pain as well on left.  Order is for Right shoulder pain but Left shoulder is chief complaint and more restricted with also neck restrictions noted. Pt does present with myofascial pain component and states doing exercises with resistance last time she was in PT was painful.  Pt would like to return to more comfortable movement in order to care for her home, shop for groceries and sleep with less disruptions.  PT would benefit from skilled PT to address impairments and pain issue and  be able to live alone with maximum function  OBJECTIVE IMPAIRMENTS: decreased ROM, decreased strength, impaired UE functional use, postural dysfunction, and pain.   ACTIVITY LIMITATIONS: lifting, sleeping, bathing, dressing, reach over head, hygiene/grooming, and housework   PARTICIPATION LIMITATIONS: meal prep, cleaning, laundry, and shopping  PERSONAL FACTORS:  See medical history and pertinent history/ myofascial component  are also affecting patient's functional outcome.   REHAB POTENTIAL: Good  CLINICAL DECISION MAKING: Evolving/moderate complexity  EVALUATION COMPLEXITY: Moderate   GOALS: Goals reviewed with patient? Yes  SHORT TERM GOALS: Target date: 08-09-23  Pt will be independent with initial HEP 75% of time Baseline:no knowledge , not currently exercising 07/26/23: mod cues required  Goal status: ONGOING  2.  Demonstrate understanding of proper sitting posture, body mechanics, work ergonomics, and be more conscious of position and posture throughout the day.  Baseline: limited knowledge Goal status: INITIAL  3. Pt will learn strategies for encouraging more restful sleep and pain relief for increased restorative sleep at night  Baseline  sleeping in recliner  Goal status INITIAL    LONG TERM GOALS: Target date: 08-30-23  Ms Durwin Glaze will be I with advanced resistant exercise Baseline: limited knowledge Goal status: INITIAL  2.  Pt will be able to reach overhead for kitchen dishes and cook ware in order to care for her needs living alone with minimal pain Baseline: EVAL -Pt restricted in motion bil UE Goal status: INITIAL  3.  Pt will be abel to reduced pain in Left shoulder by 50% Baseline: Pt left shoulder pain 8/10 to 10/10 at eval Goal status: INITIAL  4.  Pt will be able to utilize pain management techniques and understand condition in order to manage at home with HEP Baseline:limited knowledge Goal status: INITIAL  5.  Pt will demonstrate grip  strength within 2lb of contralateral limb in order to indicate improved tolerance/independence with tasks requiring manual dexterity, with less than 2pt increase in pain on NPS. Baseline: see grip strength Goal status: INITIAL  6.  Pt will score less than or equal to 36 on Quick DASH in order to indicate reduced levels of disability due to shoulder pain (MDC 16-20pts). Baseline: 56.8% Goal status: INITIAL  PLAN:  PT FREQUENCY: 1-2x/week  PT DURATION: 6 weeks  PLANNED INTERVENTIONS: 97164- PT Re-evaluation, 97110-Therapeutic exercises, 97530- Therapeutic activity, 97112- Neuromuscular re-education, 97535- Self Care, 45409- Manual therapy, [id number]- Electrical stimulation (unattended), [phone]- Electrical stimulation (manual), [phone]- Ionotophoresis 4mg /ml Dexamethasone, Patient/Family education, Taping, Dry Needling, Joint mobilization, Spinal mobilization, Cryotherapy, and Moist heat  PLAN FOR NEXT SESSION: Review HEP and TPDN   Jannette Spanner, PTA 08/01/23 11:49 AM Phone: [phone] Fax: [phone]

## 2023-08-06 NOTE — Telephone Encounter (Signed)
 Pt ready for scheduling at anytime.  Estimated out-of-pocket cost due at time of visit: $315  Primary Insurance: [employer] co-insurance: 20% (approximately $315)  Secondary Insurance: N/A  Deductible: N/A  Eligible for co-pay program: No  Prior Auth: Not required PA#:  Valid:   This summary of benefits is an estimation of the patient's out-of-pocket cost. Exact cost may vary based on individual plan coverage.

## 2023-08-06 NOTE — Telephone Encounter (Signed)
 Spoke with the patient and informed her of the message below.  Patient stated she is still dealing with shoulder problems, will think about this and may call back for an appt later.

## 2023-08-07 ENCOUNTER — Encounter: Payer: Self-pay | Admitting: Physical Therapy

## 2023-08-07 ENCOUNTER — Ambulatory Visit: Admitting: Physical Therapy

## 2023-08-07 DIAGNOSIS — G8929 Other chronic pain: Secondary | ICD-10-CM

## 2023-08-07 DIAGNOSIS — M25511 Pain in right shoulder: Secondary | ICD-10-CM

## 2023-08-07 DIAGNOSIS — M6281 Muscle weakness (generalized): Secondary | ICD-10-CM

## 2023-08-07 DIAGNOSIS — M542 Cervicalgia: Secondary | ICD-10-CM

## 2023-08-07 NOTE — Therapy (Signed)
 OUTPATIENT PHYSICAL THERAPY SHOULDER TREATMENT   Robin [name] Name: Robin [name] MRN: [id number] DOB:07/12/1933, 89 y.o., female Today's Date: 08/07/2023  END OF SESSION:  PT End of Session - 08/07/23 1100     Visit Number 5    Number of Visits 13    Date for PT Re-Evaluation 08/30/23    Authorization Type [employer]    PT Start Time 1101    PT Stop Time 1155    PT Time Calculation (min) 54 min             Past Medical History:  Diagnosis Date   Abnormal Pap smear of cervix    Allergy    Arthritis    Asthma    Cataract 2018   bilateral eyes   Colon polyps    tubulovillous adenoma, 2010   External hemorrhoids without mention of complication    GERD (gastroesophageal reflux disease)    Headache    eval with neuro in 2011   Hyperlipemia    Hyperparathyroidism (HCC)    seeing Dr. Lucianne Muss in endocrinology   Hypertension    IBS (irritable bowel syndrome), GERD, hx colon polyps, chronic abd pain - followed by Dr. Jarold Motto in GI 06/11/2012   Osteoporosis 08/29/2011   Vertigo    eval with neuro in 2011, brief recurrence 2016   Past Surgical History:  Procedure Laterality Date   BACK SURGERY  1974   CARPAL TUNNEL RELEASE Left 06/2008   COLONOSCOPY     EYE SURGERY  07/2017   bilateral cataract extraction   POLYPECTOMY     TONSILLECTOMY     UPPER GASTROINTESTINAL ENDOSCOPY     Robin [name] Active Problem List   Diagnosis Date Noted   HYPERCHOLESTEROLEMIA 01/05/2023   Peripheral neuropathy 11/30/2022   Osteoporosis of femur without pathological fracture 06/24/2019   Dyspnea 02/03/2019   Asthma 02/03/2019   Vertigo 06/26/2017   Hyperparathyroidism (HCC) 08/24/2014   Slow transit constipation 01/13/2014   IBS (irritable bowel syndrome), GERD, hx colon polyps, chronic abd pain - followed by Dr. Jarold Motto in GI 06/11/2012   Osteopenia 08/29/2011   Vitamin D deficiency 08/29/2011   GERD (gastroesophageal reflux disease) 11/23/2010   Dyslipidemia 09/09/2007    Hypertension, essential, benign 09/09/2007    PCP: Karie Georges, MD   REFERRING PROVIDER: Karie Georges, MD   REFERRING DIAG: M25.511 (ICD-10-CM) - Right shoulder pain, unspecified chronicity   THERAPY DIAG:  Acute pain of right shoulder  Chronic right shoulder pain  Cervicalgia  Muscle weakness  Rationale for Evaluation and Treatment: Rehabilitation  ONSET DATE: R shld pain  3 days ago, but left shoulder 3 weeks ago  SUBJECTIVE:  SUBJECTIVE STATEMENT: 08/01/23: Shoulder is getting better little by little.   07/30/23: I think I am doing better. But the shoulder still needs a lot of work.   07/26/23: I am about the same. I do not think the exercises helped.    EVAL: I am here because I have pain in my left shoulder that started 3 weeks ago but I also have pain in my R shoulder that began 3 days ago.  I sleep on my R normally but now I sleep anyway I can and for a week I slept in a recliner. I am now back in  the bed and I sleep any way I can.  I can only sleep 2 hours as a time. I have trouble moving my neck as well.  I really have pain in my left shoulder. I wonder if my over using my right arm is causing pain in my left arm. I have about 8/10 pain in my left now.  Pain with any reaching activities.  Difficulty with bathing and clothing. Neck motion affects her pain radiating down arm Hand dominance: Right  PERTINENT HISTORY: HTN, back surgery 1975, carpal tunnel, vertigo, osteoporosis  PAIN:  Are you having pain? Yes: NPRS scale: R shld 0/10 at rest and at worst 4-5/10 . Left at rest  5-6/10 and at worst 10/10 Pain location: R shld and L shld Pain description: achy sharp Aggravating factors: any movement, sleeping, pulling up pants, carrying groceries Relieving factors: resting, hot  shower, icy hot, biofreeze  PRECAUTIONS: None  RED FLAGS: None   WEIGHT BEARING RESTRICTIONS: No  FALLS:  Has Robin [name] fallen in last 6 months? No  LIVING ENVIRONMENT: Lives with: lives alone Lives in: House/apartment Stairs: Yes: External: 2 steps; none Has following equipment at home: Retail banker - 2 wheeled  OCCUPATION: retired  PLOF: Independent  Robin [name] GOALS:Be able to sleep normally and take care of myself, reduce pain  NEXT MD VISIT:   OBJECTIVE:  Note: Objective measures were completed at Evaluation unless otherwise noted.  DIAGNOSTIC FINDINGS:    FINDINGS: Limited evaluation due to overlapping osseous structures and overlying soft tissues. Multilevel degenerative changes. Possible multilevel osseous neural foraminal stenosis with limited evaluation due to positioning. There is no evidence of cervical spine fracture or prevertebral soft tissue swelling. Alignment is normal. No other significant bone abnormalities are identified.   IMPRESSION: No acute displaced fracture or traumatic listhesis of the cervical spine. Limited evaluation due to overlapping osseous structures and overlying soft tissues.     Electronically Signed   By: Tish Frederickson M.D.   On: 10/08/2022 00:28  RIGHT SHOULDER - 2+ VIEW   COMPARISON:  None Available.   FINDINGS: Mild AC joint degenerative change. No fracture or dislocation. No other bony abnormalities.   IMPRESSION: Mild AC joint degenerative change.     Electronically Signed   By: Gerome Sam III M.D.   On: 10/05/2022 13:33 Robin [name] SURVEYS:  Quick Dash 56.8%  COGNITION: Overall cognitive status: Within functional limits for tasks assessed     SENSATION: Pt with radiating pain from neck into Right shoulder  POSTURE: Posture forward head and flexed trunk / was sleeping in recliner and recently returned to bed   CERVICAL ROM:   Active ROM A/PROM (deg) eval  Flexion 35   Extension 15*  Right lateral flexion 22*  Left lateral flexion 23*  Right rotation 33 *  Left rotation 40   (Blank rows = not tested  N= WNL * concordant)  UPPER EXTREMITY ROM:   A ROM/PROM Right eval Left eval Left  PROM 07/26/23 Left 07/30/23 Right 08/01/23 AROM Left AROM 08/01/23 Right AROM 08/07/23 Left  AROM 08/07/23  Shoulder flexion 115 95 neck upper shld pain 95 AA 100 supine 120 115 AA supine  125 AROM  seated 115 AROM seated   Shoulder extension          Shoulder abduction 80 Pain on attempt < 60 80 Seated AA 90 100      Shoulder adduction          Shoulder internal rotation Mid buttock To ischial tuberosity        Shoulder external rotation To neck  with pain To clavicle with pain     T2  To ear with neck pain   Elbow flexion          Elbow extension          Wrist flexion          Wrist extension          Wrist ulnar deviation          Wrist radial deviation          Wrist pronation          Wrist supination          (Blank rows = not tested)  UPPER EXTREMITY MMT:  MMT Right eval Left eval  Shoulder flexion 4- 3-  Shoulder extension    Shoulder abduction 3- 3-  Shoulder adduction    Shoulder internal rotation 4- 3-  Shoulder external rotation 4- 3-  Middle trapezius    Lower trapezius    Elbow flexion    Elbow extension    Wrist flexion    Wrist extension    Wrist ulnar deviation    Wrist radial deviation    Wrist pronation    Wrist supination    Grip strength (lbs) 23.3 lb 13.3 lb   (Blank rows = not tested, score listed is out of 5 possible points.  N = WNL, D = diminished, C = clear for gross weakness with myotome testing, * = concordant pain with testing)   SHOULDER SPECIAL TESTS: Spurling (+) for neck pain,not radicular  JOINT MOBILITY TESTING:  Hypomobile in Cervical spine  PALPATION:  TTP over left upper trap/supraspinatus, R shoulder global pain   [facility] Adult PT Treatment:                                                DATE:  08/07/23 Therapeutic Exercise: Seated ER bilat YTB 10 x 2  Seated bilat scap retract -red bands with handles  10 x 2  Supine YTB horiz abdct x 10  Therapeutic Activity: Supine bilat chest press with dowel x 15 Supine bilat pullover from chest height 2 x 10 Supine bilat ER AAROM with dowel- elbows supported with rolled towels 10 x 2  Supine right AROM shoulder flexion long lever 2 x 10 Supine right AROM shoulder short lever 2 x 10 Seated UE ranger for left shoulder scaption , also shoulder flexion to horizontal abduction- increased pain / bone rubbing Modalities:  Moist heat to left shoulder x 10 min    [facility] Adult PT Treatment:  DATE: 08/01/23 Therapeutic Activity: Seated table slides flexion- switched to foam roller x 10, thumbs in , thumbs up Seated table slides for abduction switched to foam roller x 10 each  Seated scap retract -thigh slides x 8- cues for comfortable ROM Supine chest press with dowel x 10  Supine pullover from chest height x 6 -115 degrees left  Supine protraction with dowel  Supine ER AAROM with dowel- elbows supported with rolled towels 10 x 2  Modalities:  Moist heat to left shoulder x 10 min   [facility] Adult PT Treatment:                                                DATE: 07/30/23 Therapeutic Activity: Seated table slides flexion- switched to foam roller x 10, thumbs in , thumbs up Seated table slides for abduction switched to foam roller x 10 each  Seated scap retract -thigh slides x 8- cues for comfortable ROM Supine chest press with dowel x 10  Supine pullover from chest height x 6  Supine horiz abdct- addct with dowel x 10 Supine ER AAROM with dowel- elbows supported with rolled towels 10 x 2  Updated HEP  Self Care: Positioning suggestions and demonstrations for sleep in bed. Pt currently sleeping in recliner but did recently get a new mattress                                                                                                                                [facility] Adult PT Treatment:                                                DATE: 07/26/23 Therapeutic Activity: Seated Table Slides for shoulder flexion and scaption -heavy cues  Chest press 5 x 2 with dowel  Supine ER AAROM bilat with dowel  Seated review of upper trap and levator stretches - mod cues  Manual Therapy: Inferior and A/P glides  Modalities: HMP left shoulder x 8 minutes  Self Care: Rationale of Therex progression, Avoidance of pain, use of modalities       Robin [name] EDUCATION: Education details: POC, Explanation of findings, TPDN and issue HEP Person educated: Robin [name] Education method: Explanation, Demonstration, Tactile cues, Verbal cues, and Handouts Education comprehension: verbalized understanding, returned demonstration, verbal cues required, tactile cues required, and needs further education  HOME EXERCISE PROGRAM: Access Code: [email or web address] URL: [email or web address] Date: 07/19/2023 Prepared by: Garen Lah  Exercises - Seated Gentle Upper Trapezius Stretch  - 1 x daily - 7 x weekly - 1 sets - 3 reps - 30 hold - Gentle Levator Scapulae Stretch  - 1 x daily - 7 x weekly -  3 sets - 3 reps - 30 hold - Seated Shoulder Flexion Slide at Table Top with Forearm in Neutral  - 1 x daily - 7 x weekly - 3 sets - 10 reps - Seated Shoulder Scaption Slide at Table Top with Forearm in Neutral  - 1 x daily - 7 x weekly - 3 sets - 10 reps 07/30/23 - Supine chest press  - 1 x daily - 7 x weekly - 1-2 sets - 10 reps - Supine shoulder Flexion Extension AAROM with Dowel to comfortable height overhead  - 1 x daily - 7 x weekly - 1-2 sets - 10 reps - Supine Shoulder External Rotation with Dowel  - 1 x daily - 7 x weekly - 1-2 sets - 10 reps Added 08/07/23 - Seated Bilateral Shoulder External Rotation with Resistance  - 1 x daily - 7 x weekly - 2 sets - 10 reps  ASSESSMENT:  CLINICAL IMPRESSION: Pt  reports slow but steady improvement in shoulder pain and function. She demonstrates increased Seated AROM in bilateral shoulders. Left continues to be most weak and most irritable. She demonstrates improved tolerance to repeated reps of AAROM and was able to progress to AROM in supine for shoulder flexion. Attempted lying on right side for left shoulder AROM, however unable to tolerate due to increased pain with movements. Attempted seated UE ranger for scaption, which was also not well tolerated. She was given yellow band for shoulder ER strengtheing and encouraged to continue current HEP.      EVAL: Robin [name] is a 89  y.o. female  who was seen today for physical therapy evaluation and treatment for  bil shoulder pain and presents with neck pain as well on left.  Order is for Right shoulder pain but Left shoulder is chief complaint and more restricted with also neck restrictions noted. Pt does present with myofascial pain component and states doing exercises with resistance last time she was in PT was painful.  Pt would like to return to more comfortable movement in order to care for her home, shop for groceries and sleep with less disruptions.  PT would benefit from skilled PT to address impairments and pain issue and be able to live alone with maximum function  OBJECTIVE IMPAIRMENTS: decreased ROM, decreased strength, impaired UE functional use, postural dysfunction, and pain.   ACTIVITY LIMITATIONS: lifting, sleeping, bathing, dressing, reach over head, hygiene/grooming, and housework   PARTICIPATION LIMITATIONS: meal prep, cleaning, laundry, and shopping  PERSONAL FACTORS:  See medical history and pertinent history/ myofascial component  are also affecting Robin [name]'s functional outcome.   REHAB POTENTIAL: Good  CLINICAL DECISION MAKING: Evolving/moderate complexity  EVALUATION COMPLEXITY: Moderate   GOALS: Goals reviewed with Robin [name]? Yes  SHORT TERM GOALS: Target date: 08-09-23  Pt will  be independent with initial HEP 75% of time Baseline:no knowledge , not currently exercising 07/26/23: mod cues required  Goal status: ONGOING  2.  Demonstrate understanding of proper sitting posture, body mechanics, work ergonomics, and be more conscious of position and posture throughout the day.  Baseline: limited knowledge Goal status: INITIAL  3. Pt will learn strategies for encouraging more restful sleep and pain relief for increased restorative sleep at night  Baseline  sleeping in recliner  Goal status INITIAL    LONG TERM GOALS: Target date: 08-30-23  Ms Durwin Glaze will be I with advanced resistant exercise Baseline: limited knowledge Goal status: INITIAL  2.  Pt will be able to reach overhead for kitchen dishes and cook ware  in order to care for her needs living alone with minimal pain Baseline: EVAL -Pt restricted in motion bil UE Goal status: INITIAL  3.  Pt will be abel to reduced pain in Left shoulder by 50% Baseline: Pt left shoulder pain 8/10 to 10/10 at eval Goal status: INITIAL  4.  Pt will be able to utilize pain management techniques and understand condition in order to manage at home with HEP Baseline:limited knowledge Goal status: INITIAL  5.  Pt will demonstrate grip strength within 2lb of contralateral limb in order to indicate improved tolerance/independence with tasks requiring manual dexterity, with less than 2pt increase in pain on NPS. Baseline: see grip strength Goal status: INITIAL  6.  Pt will score less than or equal to 36 on Quick DASH in order to indicate reduced levels of disability due to shoulder pain (MDC 16-20pts). Baseline: 56.8% Goal status: INITIAL  PLAN:  PT FREQUENCY: 1-2x/week  PT DURATION: 6 weeks  PLANNED INTERVENTIONS: 97164- PT Re-evaluation, 97110-Therapeutic exercises, 97530- Therapeutic activity, 97112- Neuromuscular re-education, 97535- Self Care, 16109- Manual therapy, G0283- Electrical stimulation (unattended), [phone]-  Electrical stimulation (manual), [phone]- Ionotophoresis 4mg /ml Dexamethasone, Robin [name]/Family education, Taping, Dry Needling, Joint mobilization, Spinal mobilization, Cryotherapy, and Moist heat  PLAN FOR NEXT SESSION: Review HEP and TPDN   Jannette Spanner, PTA 08/07/23 12:47 PM Phone: [phone] Fax: [phone]

## 2023-08-09 ENCOUNTER — Ambulatory Visit: Payer: PPO | Attending: Cardiology | Admitting: Cardiology

## 2023-08-09 ENCOUNTER — Encounter: Payer: Self-pay | Admitting: Cardiology

## 2023-08-09 VITALS — BP 152/88 | HR 74 | Ht 59.0 in | Wt 156.8 lb

## 2023-08-09 DIAGNOSIS — I10 Essential (primary) hypertension: Secondary | ICD-10-CM | POA: Diagnosis not present

## 2023-08-09 DIAGNOSIS — I2510 Atherosclerotic heart disease of native coronary artery without angina pectoris: Secondary | ICD-10-CM | POA: Diagnosis not present

## 2023-08-09 DIAGNOSIS — E7800 Pure hypercholesterolemia, unspecified: Secondary | ICD-10-CM

## 2023-08-09 MED ORDER — CARVEDILOL 3.125 MG PO TABS: ORAL_TABLET | ORAL | 3 refills | Status: AC

## 2023-08-09 NOTE — Patient Instructions (Signed)
 Medication Instructions:  Your physician recommends that you continue on your current medications as directed. Please refer to the Current Medication list given to you today.  *If you need a refill on your cardiac medications before your next appointment, please call your pharmacy*   Follow-Up: At [facility], you and your health needs are our priority.  As part of our continuing mission to provide you with exceptional heart care, we have created designated Provider Care Teams.  These Care Teams include your primary Cardiologist (physician) and Advanced Practice Providers (APPs -  Physician Assistants and Nurse Practitioners) who all work together to provide you with the care you need, when you need it.  We recommend signing up for the patient portal called "MyChart".  Sign up information is provided on this After Visit Summary.  MyChart is used to connect with patients for Virtual Visits (Telemedicine).  Patients are able to view lab/test results, encounter notes, upcoming appointments, etc.  Non-urgent messages can be sent to your provider as well.   To learn more about what you can do with MyChart, go to [email or web address].    Your next appointment:   6 month(s)  Provider:   Olga Millers, MD     Other Instructions   1st Floor: - Lobby - Registration  - Pharmacy  - Lab - Cafe  2nd Floor: - PV Lab - Diagnostic Testing (echo, CT, nuclear med)  3rd Floor: - Vacant  4th Floor: - TCTS (cardiothoracic surgery) - AFib Clinic - Structural Heart Clinic - Vascular Surgery  - Vascular Ultrasound  5th Floor: - HeartCare Cardiology (general and EP) - Clinical Pharmacy for coumadin, hypertension, lipid, weight-loss medications, and med management appointments    Valet parking services will be available as well.

## 2023-08-10 ENCOUNTER — Encounter: Admitting: Physical Therapy

## 2023-08-13 ENCOUNTER — Ambulatory Visit: Admitting: Physical Therapy

## 2023-08-13 DIAGNOSIS — M25511 Pain in right shoulder: Secondary | ICD-10-CM | POA: Diagnosis not present

## 2023-08-13 DIAGNOSIS — M542 Cervicalgia: Secondary | ICD-10-CM

## 2023-08-13 DIAGNOSIS — G8929 Other chronic pain: Secondary | ICD-10-CM

## 2023-08-13 NOTE — Therapy (Signed)
 OUTPATIENT PHYSICAL THERAPY SHOULDER TREATMENT   Patient Name: KINNEDY MONGIELLO MRN: [id number] DOB:Nov 25, 1933, 89 y.o., female Today's Date: 08/13/2023  END OF SESSION:  PT End of Session - 08/13/23 1226     Visit Number 6    Number of Visits 13    Date for PT Re-Evaluation 08/30/23    Authorization Type [employer]    PT Start Time 1018    PT Stop Time 1102    PT Time Calculation (min) 44 min              Past Medical History:  Diagnosis Date   Abnormal Pap smear of cervix    Allergy    Arthritis    Asthma    Cataract 2018   bilateral eyes   Colon polyps    tubulovillous adenoma, 2010   External hemorrhoids without mention of complication    GERD (gastroesophageal reflux disease)    Headache    eval with neuro in 2011   Hyperlipemia    Hyperparathyroidism (HCC)    seeing Dr. Lucianne Muss in endocrinology   Hypertension    IBS (irritable bowel syndrome), GERD, hx colon polyps, chronic abd pain - followed by Dr. Jarold Motto in GI 06/11/2012   Osteoporosis 08/29/2011   Vertigo    eval with neuro in 2011, brief recurrence 2016   Past Surgical History:  Procedure Laterality Date   BACK SURGERY  1974   CARPAL TUNNEL RELEASE Left 06/2008   COLONOSCOPY     EYE SURGERY  07/2017   bilateral cataract extraction   POLYPECTOMY     TONSILLECTOMY     UPPER GASTROINTESTINAL ENDOSCOPY     Patient Active Problem List   Diagnosis Date Noted   HYPERCHOLESTEROLEMIA 01/05/2023   Peripheral neuropathy 11/30/2022   Osteoporosis of femur without pathological fracture 06/24/2019   Dyspnea 02/03/2019   Asthma 02/03/2019   Vertigo 06/26/2017   Hyperparathyroidism (HCC) 08/24/2014   Slow transit constipation 01/13/2014   IBS (irritable bowel syndrome), GERD, hx colon polyps, chronic abd pain - followed by Dr. Jarold Motto in GI 06/11/2012   Osteopenia 08/29/2011   Vitamin D deficiency 08/29/2011   GERD (gastroesophageal reflux disease) 11/23/2010   Dyslipidemia 09/09/2007    Hypertension, essential, benign 09/09/2007    PCP: Karie Georges, MD   REFERRING PROVIDER: Karie Georges, MD   REFERRING DIAG: M25.511 (ICD-10-CM) - Right shoulder pain, unspecified chronicity   THERAPY DIAG:  Acute pain of right shoulder  Chronic right shoulder pain  Cervicalgia  Rationale for Evaluation and Treatment: Rehabilitation  ONSET DATE: R shld pain  3 days ago, but left shoulder 3 weeks ago  SUBJECTIVE:  SUBJECTIVE STATEMENT: 08/13/23:" I am doing fair, still feel pain pretty quick in the morning."  08/01/23: Shoulder is getting better little by little.   07/30/23: I think I am doing better. But the shoulder still needs a lot of work.   07/26/23: I am about the same. I do not think the exercises helped.    EVAL: I am here because I have pain in my left shoulder that started 3 weeks ago but I also have pain in my R shoulder that began 3 days ago.  I sleep on my R normally but now I sleep anyway I can and for a week I slept in a recliner. I am now back in  the bed and I sleep any way I can.  I can only sleep 2 hours as a time. I have trouble moving my neck as well.  I really have pain in my left shoulder. I wonder if my over using my right arm is causing pain in my left arm. I have about 8/10 pain in my left now.  Pain with any reaching activities.  Difficulty with bathing and clothing. Neck motion affects her pain radiating down arm Hand dominance: Right  PERTINENT HISTORY: HTN, back surgery 1975, carpal tunnel, vertigo, osteoporosis  PAIN:  Are you having pain? Yes: NPRS scale: R shld 4/10 at rest and at worst 4-5/10 . Left at rest  5/10 and at worst 10/10 Pain location: R shld and L shld Pain description: achy sharp Aggravating factors: any movement, sleeping, pulling up pants,  carrying groceries Relieving factors: resting, hot shower, icy hot, biofreeze  PRECAUTIONS: None  RED FLAGS: None   WEIGHT BEARING RESTRICTIONS: No  FALLS:  Has patient fallen in last 6 months? 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  DIAGNOSTIC FINDINGS:    FINDINGS: Limited evaluation due to overlapping osseous structures and overlying soft tissues. Multilevel degenerative changes. Possible multilevel osseous neural foraminal stenosis with limited evaluation due to positioning. There is no evidence of cervical spine fracture or prevertebral soft tissue swelling. Alignment is normal. No other significant bone abnormalities are identified.   IMPRESSION: No acute displaced fracture or traumatic listhesis of the cervical spine. Limited evaluation due to overlapping osseous structures and overlying soft tissues.     Electronically Signed   By: Tish Frederickson M.D.   On: 10/08/2022 00:28  RIGHT SHOULDER - 2+ VIEW   COMPARISON:  None Available.   FINDINGS: Mild AC joint degenerative change. No fracture or dislocation. No other bony abnormalities.   IMPRESSION: Mild AC joint degenerative change.     Electronically Signed   By: Gerome Sam III M.D.   On: 10/05/2022 13:33 PATIENT SURVEYS:  Quick Dash 56.8%  COGNITION: Overall cognitive status: Within functional limits for tasks assessed     SENSATION: Pt with radiating pain from neck into Right shoulder  POSTURE: Posture forward head and flexed trunk / was sleeping in recliner and recently returned to bed   CERVICAL ROM:    Active ROM A/PROM (deg) eval  Flexion 35  Extension 15*  Right lateral flexion 22*  Left lateral flexion 23*  Right rotation  33 *  Left rotation 40   (Blank rows = not tested  N= WNL * concordant)  UPPER EXTREMITY ROM:   A ROM/PROM Right eval Left eval Left  PROM 07/26/23 Left 07/30/23 Right 08/01/23 AROM Left AROM 08/01/23 Right AROM 08/07/23 Left  AROM 08/07/23 Left AROM  Shoulder flexion 115 95 neck upper shld pain 95 AA 100 supine 120 115 AA supine  125 AROM  seated 115 AROM seated  95 AROM seated  Shoulder extension           Shoulder abduction 80 Pain on attempt < 60 80 Seated AA 90 100       Shoulder adduction           Shoulder internal rotation Mid buttock To ischial tuberosity         Shoulder external rotation To neck  with pain To clavicle with pain     T2  To ear with neck pain  C2  Elbow flexion           Elbow extension           Wrist flexion           Wrist extension           Wrist ulnar deviation           Wrist radial deviation           Wrist pronation           Wrist supination           (Blank rows = not tested)  UPPER EXTREMITY MMT:  MMT Right eval Left eval  Shoulder flexion 4- 3-  Shoulder extension    Shoulder abduction 3- 3-  Shoulder adduction    Shoulder internal rotation 4- 3-  Shoulder external rotation 4- 3-  Middle trapezius    Lower trapezius    Elbow flexion    Elbow extension    Wrist flexion    Wrist extension    Wrist ulnar deviation    Wrist radial deviation    Wrist pronation    Wrist supination    Grip strength (lbs) 23.3 lb 13.3 lb   (Blank rows = not tested, score listed is out of 5 possible points.  N = WNL, D = diminished, C = clear for gross weakness with myotome testing, * = concordant pain with testing)   SHOULDER SPECIAL TESTS: Spurling (+) for neck pain,not radicular  JOINT MOBILITY TESTING:  Hypomobile in Cervical spine  PALPATION:  TTP over left upper trap/supraspinatus, R shoulder global  pain   [facility] Adult PT Treatment:                                                DATE: 08/13/23 MTPR along the middle deltoid Taught how to use Theracane and ways do it at home Upper trap stretch 2 x 30 sec, 1 x bil PROM flexion/ abduction with ossicaliatoins to help reduce pian AP glenohumeral mobs grade III, inferior mobs grade III Scapular protraction with dowel rod - tactile and verbal cues to promote scapular protraction Seated shoulder ER/IR bil AAROM with dowel Seated rows 2 x 12 bil YTB Scapular retraction with ER bil with YTB 2 x 10   [facility] Adult PT Treatment:  DATE: 08/07/23 Therapeutic Exercise: Seated ER bilat YTB 10 x 2  Seated bilat scap retract -red bands with handles  10 x 2  Supine YTB horiz abdct x 10  Therapeutic Activity: Supine bilat chest press with dowel x 15 Supine bilat pullover from chest height 2 x 10 Supine bilat ER AAROM with dowel- elbows supported with rolled towels 10 x 2  Supine right AROM shoulder flexion long lever 2 x 10 Supine right AROM shoulder short lever 2 x 10 Seated UE ranger for left shoulder scaption , also shoulder flexion to horizontal abduction- increased pain / bone rubbing Modalities:  Moist heat to left shoulder x 10 min  [facility] Adult PT Treatment:                                                DATE: 08/01/23 Therapeutic Activity: Seated table slides flexion- switched to foam roller x 10, thumbs in , thumbs up Seated table slides for abduction switched to foam roller x 10 each  Seated scap retract -thigh slides x 8- cues for comfortable ROM Supine chest press with dowel x 10  Supine pullover from chest height x 6 -115 degrees left  Supine protraction with dowel  Supine ER AAROM with dowel- elbows supported with rolled towels 10 x 2  Modalities:  Moist heat to left shoulder x 10 min                                                                                                          [facility] Adult PT Treatment:                                                DATE: 07/26/23 Therapeutic Activity: Seated Table Slides for shoulder flexion and scaption -heavy cues  Chest press 5 x 2 with dowel  Supine ER AAROM bilat with dowel  Seated review of upper trap and levator stretches - mod cues  Manual Therapy: Inferior and A/P glides  Modalities: HMP left shoulder x 8 minutes  Self Care: Rationale of Therex progression, Avoidance of pain, use of modalities  PATIENT EDUCATION: Education details: POC, Explanation of findings, TPDN and issue HEP Person educated: Patient Education method: Explanation, Demonstration, Tactile cues, Verbal cues, and Handouts Education comprehension: verbalized understanding, returned demonstration, verbal cues required, tactile cues required, and needs further education  HOME EXERCISE PROGRAM: Access Code: [email or web address] URL: [email or web address] Date: 08/13/2023 Prepared by: Lulu Riding  Exercises - Seated Gentle Upper Trapezius Stretch  - 1 x daily - 7 x weekly - 1 sets - 3 reps - 30 hold - Gentle Levator Scapulae Stretch  - 1 x daily - 7 x weekly - 3 sets - 3 reps - 30 hold - Seated Shoulder Flexion Slide at Table Top with Forearm in Neutral  -  1 x daily - 7 x weekly - 3 sets - 10 reps - Seated Shoulder Scaption Slide at Table Top with Forearm in Neutral  - 1 x daily - 7 x weekly - 3 sets - 10 reps - Supine chest press  - 1 x daily - 7 x weekly - 1-2 sets - 10 reps - Supine shoulder Flexion Extension AAROM with Dowel to comfortable height overhead  - 1 x daily - 7 x weekly - 1-2 sets - 10 reps - Supine Shoulder External Rotation with Dowel  - 1 x daily - 7 x weekly - 1-2 sets - 10 reps - Seated Bilateral Shoulder External Rotation with Resistance  - 1 x daily - 7 x weekly - 2 sets - 10 reps - Scapular retraction with ER (MONEY)  - 1 x daily - 7 x weekly - 2 sets - 10 reps  ASSESSMENT:  CLINICAL IMPRESSION: 08/13/2023 Mester  reports to PT today noting continued bil shoulder pain with L>R. Offered TPDN she opted not to do due so worked on [employer] which she noted some relief with. Cotninued working on L shoulder PROM/ AAROM with frequent verbal cues needed to relax. She did well with strengthening for scapular stabilizers and shoulder external rotators. She increased her ER from beginning of session to end of session reaching for C1 to C5.     EVAL: Patient is a 89  y.o. female  who was seen today for physical therapy evaluation and treatment for  bil shoulder pain and presents with neck pain as well on left.  Order is for Right shoulder pain but Left shoulder is chief complaint and more restricted with also neck restrictions noted. Pt does present with myofascial pain component and states doing exercises with resistance last time she was in PT was painful.  Pt would like to return to more comfortable movement in order to care for her home, shop for groceries and sleep with less disruptions.  PT would benefit from skilled PT to address impairments and pain issue and be able to live alone with maximum function  OBJECTIVE IMPAIRMENTS: decreased ROM, decreased strength, impaired UE functional use, postural dysfunction, and pain.   ACTIVITY LIMITATIONS: lifting, sleeping, bathing, dressing, reach over head, hygiene/grooming, and housework   PARTICIPATION LIMITATIONS: meal prep, cleaning, laundry, and shopping  PERSONAL FACTORS:  See medical history and pertinent history/ myofascial component  are also affecting patient's functional outcome.   REHAB POTENTIAL: Good  CLINICAL DECISION MAKING: Evolving/moderate complexity  EVALUATION COMPLEXITY: Moderate   GOALS: Goals reviewed with patient? Yes  SHORT TERM GOALS: Target date: 08-09-23  Pt will be independent with initial HEP 75% of time Baseline:no knowledge , not currently exercising 07/26/23: mod cues required  Goal status: ONGOING  2.   Demonstrate understanding of proper sitting posture, body mechanics, work ergonomics, and be more conscious of position and posture throughout the day.  Baseline: limited knowledge Goal status: INITIAL  3. Pt will learn strategies for encouraging more restful sleep and pain relief for increased restorative sleep at night  Baseline  sleeping in recliner  Goal status INITIAL    LONG TERM GOALS: Target date: 08-30-23  Ms Durwin Glaze will be I with advanced resistant exercise Baseline: limited knowledge Goal status: INITIAL  2.  Pt will be able to reach overhead for kitchen dishes and cook ware in order to care for her needs living alone with minimal pain Baseline: EVAL -Pt restricted in motion bil UE Goal status: INITIAL  3.  Pt will be abel to reduced pain in Left shoulder by 50% Baseline: Pt left shoulder pain 8/10 to 10/10 at eval Goal status: INITIAL  4.  Pt will be able to utilize pain management techniques and understand condition in order to manage at home with HEP Baseline:limited knowledge Goal status: INITIAL  5.  Pt will demonstrate grip strength within 2lb of contralateral limb in order to indicate improved tolerance/independence with tasks requiring manual dexterity, with less than 2pt increase in pain on NPS. Baseline: see grip strength Goal status: INITIAL  6.  Pt will score less than or equal to 36 on Quick DASH in order to indicate reduced levels of disability due to shoulder pain (MDC 16-20pts). Baseline: 56.8% Goal status: INITIAL  PLAN:  PT FREQUENCY: 1-2x/week  PT DURATION: 6 weeks  PLANNED INTERVENTIONS: 97164- PT Re-evaluation, 97110-Therapeutic exercises, 97530- Therapeutic activity, 97112- Neuromuscular re-education, 97535- Self Care, [id number]- Manual therapy, [id number]- Electrical stimulation (unattended), [phone]- Electrical stimulation (manual), [phone]- Ionotophoresis 4mg /ml Dexamethasone, Patient/Family education, Taping, Dry Needling, Joint mobilization, Spinal  mobilization, Cryotherapy, and Moist heat  PLAN FOR NEXT SESSION: Review HEP and TPDN  Amnah Breuer PT, DPT, LAT, ATC  08/13/23  12:27 PM

## 2023-08-14 DIAGNOSIS — H02831 Dermatochalasis of right upper eyelid: Secondary | ICD-10-CM | POA: Diagnosis not present

## 2023-08-14 DIAGNOSIS — H01004 Unspecified blepharitis left upper eyelid: Secondary | ICD-10-CM | POA: Diagnosis not present

## 2023-08-14 DIAGNOSIS — H0100A Unspecified blepharitis right eye, upper and lower eyelids: Secondary | ICD-10-CM | POA: Diagnosis not present

## 2023-08-14 DIAGNOSIS — H524 Presbyopia: Secondary | ICD-10-CM | POA: Diagnosis not present

## 2023-08-14 DIAGNOSIS — D3131 Benign neoplasm of right choroid: Secondary | ICD-10-CM | POA: Diagnosis not present

## 2023-08-14 DIAGNOSIS — H02834 Dermatochalasis of left upper eyelid: Secondary | ICD-10-CM | POA: Diagnosis not present

## 2023-08-14 DIAGNOSIS — H52203 Unspecified astigmatism, bilateral: Secondary | ICD-10-CM | POA: Diagnosis not present

## 2023-08-14 DIAGNOSIS — H18593 Other hereditary corneal dystrophies, bilateral: Secondary | ICD-10-CM | POA: Diagnosis not present

## 2023-08-14 DIAGNOSIS — H04123 Dry eye syndrome of bilateral lacrimal glands: Secondary | ICD-10-CM | POA: Diagnosis not present

## 2023-08-16 ENCOUNTER — Encounter: Payer: Self-pay | Admitting: Physical Therapy

## 2023-08-16 ENCOUNTER — Ambulatory Visit: Attending: Family Medicine | Admitting: Physical Therapy

## 2023-08-16 DIAGNOSIS — G8929 Other chronic pain: Secondary | ICD-10-CM | POA: Insufficient documentation

## 2023-08-16 DIAGNOSIS — M25511 Pain in right shoulder: Secondary | ICD-10-CM | POA: Insufficient documentation

## 2023-08-16 DIAGNOSIS — M542 Cervicalgia: Secondary | ICD-10-CM | POA: Insufficient documentation

## 2023-08-16 DIAGNOSIS — M6281 Muscle weakness (generalized): Secondary | ICD-10-CM | POA: Diagnosis not present

## 2023-08-16 NOTE — Therapy (Signed)
 OUTPATIENT PHYSICAL THERAPY SHOULDER TREATMENT   Patient Name: Robin [name] MRN: [id number] DOB:September 10, 1933, 89 y.o., female Today's Date: 08/16/2023  END OF SESSION:  PT End of Session - 08/16/23 1102     Visit Number 7    Number of Visits 13    Date for PT Re-Evaluation 08/30/23    Authorization Type [employer]    PT Start Time 1102    PT Stop Time 1150    PT Time Calculation (min) 48 min    Activity Tolerance No increased pain;Patient limited by fatigue;Patient tolerated treatment well    Behavior During Therapy [facility] for tasks assessed/performed               Past Medical History:  Diagnosis Date   Abnormal Pap smear of cervix    Allergy    Arthritis    Asthma    Cataract 2018   bilateral eyes   Colon polyps    tubulovillous adenoma, 2010   External hemorrhoids without mention of complication    GERD (gastroesophageal reflux disease)    Headache    eval with neuro in 2011   Hyperlipemia    Hyperparathyroidism (HCC)    seeing Dr. Lucianne Muss in endocrinology   Hypertension    IBS (irritable bowel syndrome), GERD, hx colon polyps, chronic abd pain - followed by Dr. Jarold Motto in GI 06/11/2012   Osteoporosis 08/29/2011   Vertigo    eval with neuro in 2011, brief recurrence 2016   Past Surgical History:  Procedure Laterality Date   BACK SURGERY  1974   CARPAL TUNNEL RELEASE Left 06/2008   COLONOSCOPY     EYE SURGERY  07/2017   bilateral cataract extraction   POLYPECTOMY     TONSILLECTOMY     UPPER GASTROINTESTINAL ENDOSCOPY     Patient Active Problem List   Diagnosis Date Noted   HYPERCHOLESTEROLEMIA 01/05/2023   Peripheral neuropathy 11/30/2022   Osteoporosis of femur without pathological fracture 06/24/2019   Dyspnea 02/03/2019   Asthma 02/03/2019   Vertigo 06/26/2017   Hyperparathyroidism (HCC) 08/24/2014   Slow transit constipation 01/13/2014   IBS (irritable bowel syndrome), GERD, hx colon polyps, chronic abd pain - followed by Dr.  Jarold Motto in GI 06/11/2012   Osteopenia 08/29/2011   Vitamin D deficiency 08/29/2011   GERD (gastroesophageal reflux disease) 11/23/2010   Dyslipidemia 09/09/2007   Hypertension, essential, benign 09/09/2007    PCP: Karie Georges, MD   REFERRING PROVIDER: Karie Georges, MD   REFERRING DIAG: M25.511 (ICD-10-CM) - Right shoulder pain, unspecified chronicity   THERAPY DIAG:  Acute pain of right shoulder  Chronic right shoulder pain  Cervicalgia  Muscle weakness  Rationale for Evaluation and Treatment: Rehabilitation  ONSET DATE: R shld pain  3 days ago, but left shoulder 3 weeks ago  SUBJECTIVE:  SUBJECTIVE STATEMENT: 08/16/23 "I'm doing about the same, did some heater the last session at home. Tough to tell if the manual techniques helped or not."  08/13/23:" I am doing fair, still feel pain pretty quick in the morning."  08/01/23: Shoulder is getting better little by little.   07/30/23: I think I am doing better. But the shoulder still needs a lot of work.   EVAL: I am here because I have pain in my left shoulder that started 3 weeks ago but I also have pain in my R shoulder that began 3 days ago.  I sleep on my R normally but now I sleep anyway I can and for a week I slept in a recliner. I am now back in  the bed and I sleep any way I can.  I can only sleep 2 hours as a time. I have trouble moving my neck as well.  I really have pain in my left shoulder. I wonder if my over using my right arm is causing pain in my left arm. I have about 8/10 pain in my left now.  Pain with any reaching activities.  Difficulty with bathing and clothing. Neck motion affects her pain radiating down arm Hand dominance: Right  PERTINENT HISTORY: HTN, back surgery 1975, carpal tunnel, vertigo, osteoporosis  PAIN:   Are you having pain? Yes: NPRS scale: L shld 5/10 at rest Pain location: R shld 0/10 Pain description: achy sharp Aggravating factors: any movement, sleeping, pulling up pants, carrying groceries Relieving factors: resting, hot shower, icy hot, biofreeze  PRECAUTIONS: None  RED FLAGS: None   WEIGHT BEARING RESTRICTIONS: No  FALLS:  Has patient fallen in last 6 months? 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  DIAGNOSTIC FINDINGS:    FINDINGS: Limited evaluation due to overlapping osseous structures and overlying soft tissues. Multilevel degenerative changes. Possible multilevel osseous neural foraminal stenosis with limited evaluation due to positioning. There is no evidence of cervical spine fracture or prevertebral soft tissue swelling. Alignment is normal. No other significant bone abnormalities are identified.   IMPRESSION: No acute displaced fracture or traumatic listhesis of the cervical spine. Limited evaluation due to overlapping osseous structures and overlying soft tissues.     Electronically Signed   By: Tish Frederickson M.D.   On: 10/08/2022 00:28  RIGHT SHOULDER - 2+ VIEW   COMPARISON:  None Available.   FINDINGS: Mild AC joint degenerative change. No fracture or dislocation. No other bony abnormalities.   IMPRESSION: Mild AC joint degenerative change.     Electronically Signed   By: Gerome Sam III M.D.   On: 10/05/2022 13:33  PATIENT SURVEYS:  Neldon Mc 56.8% 08/16/23 40.9%  COGNITION: Overall cognitive status: Within functional limits for tasks  assessed     SENSATION: Pt with radiating pain from neck into Right shoulder  POSTURE: Posture forward head and flexed trunk / was sleeping in recliner and recently returned to bed   CERVICAL ROM:   Active ROM A/PROM (deg) eval  Flexion 35  Extension 15*  Right lateral flexion 22*  Left lateral flexion 23*  Right rotation 33 *  Left  rotation 40   (Blank rows = not tested  N= WNL * concordant)  UPPER EXTREMITY ROM:   A ROM/PROM Right eval Left eval Left  PROM 07/26/23 Left 07/30/23 Right 08/01/23 AROM Left AROM 08/01/23 Right AROM 08/07/23 Left  AROM 08/07/23 Left AROM  Shoulder flexion 115 95 neck upper shld pain 95 AA 100 supine 120 115 AA supine  125 AROM  seated 115 AROM seated  95 AROM seated  Shoulder extension           Shoulder abduction 80 Pain on attempt < 60 80 Seated AA 90 100       Shoulder adduction           Shoulder internal rotation Mid buttock To ischial tuberosity         Shoulder external rotation To neck  with pain To clavicle with pain     T2  To ear with neck pain  C2  Elbow flexion           Elbow extension           Wrist flexion           Wrist extension           Wrist ulnar deviation           Wrist radial deviation           Wrist pronation           Wrist supination           (Blank rows = not tested)  UPPER EXTREMITY MMT:  MMT Right eval Left eval  Shoulder flexion 4- 3-  Shoulder extension    Shoulder abduction 3- 3-  Shoulder adduction    Shoulder internal rotation 4- 3-  Shoulder external rotation 4- 3-  Middle trapezius    Lower trapezius    Elbow flexion    Elbow extension    Wrist flexion    Wrist extension    Wrist ulnar deviation    Wrist radial deviation    Wrist pronation    Wrist supination    Grip strength (lbs) 23.3 lb 13.3 lb   (Blank rows = not tested, score listed is out of 5 possible points.  N = WNL, D = diminished, C = clear for gross weakness with myotome testing, * = concordant pain with testing)    SHOULDER SPECIAL TESTS: Spurling (+) for neck pain,not radicular  JOINT MOBILITY TESTING:  Hypomobile in Cervical spine  PALPATION:  TTP over left upper trap/supraspinatus, R shoulder global pain   [facility] Adult PT Treatment:                                                DATE: 08/16/23 Manual trigger point release for the L upper trap.  IASTM techniques for L upper trap in sitting.  First rib mobs.  UBE L1 x 4 min (FWD/BWD x 2 min ea) Seated L upper trap stretch Foam roll routine ceiling punches, horizontal add/ abduction, back stroke, x to y Reviewed/ updated HEP  [facility] Adult PT Treatment:                                                DATE: 08/13/23  MTPR along the middle deltoid Taught how to use Theracane and ways do it at home Upper trap stretch 2 x 30 sec, 1 x bil PROM flexion/ abduction with ossicaliatoins to help reduce pian AP glenohumeral mobs grade III, inferior mobs grade III Scapular protraction with dowel rod - tactile and verbal cues to promote scapular protraction Seated shoulder ER/IR bil AAROM with dowel Seated rows 2 x 12 bil YTB Scapular retraction with ER bil with YTB 2 x 10  [facility] Adult PT Treatment:                                                DATE: 08/07/23 Therapeutic Exercise: Seated ER bilat YTB 10 x 2  Seated bilat scap retract -red bands with handles  10 x 2  Supine YTB horiz abdct x 10  Therapeutic Activity: Supine bilat chest press with dowel x 15 Supine bilat pullover from chest height 2 x 10 Supine bilat ER AAROM with dowel- elbows supported with rolled towels 10 x 2  Supine right AROM shoulder flexion long lever 2 x 10 Supine right AROM shoulder short lever 2 x 10 Seated UE ranger for left shoulder scaption , also shoulder flexion to horizontal abduction- increased pain / bone rubbing Modalities:  Moist heat to left shoulder x 10 min                                                                                                          PATIENT EDUCATION: Education details: POC, Explanation of findings, TPDN and issue HEP Person educated: Patient Education method: Explanation, Demonstration, Tactile cues, Verbal cues, and Handouts Education comprehension: verbalized understanding, returned demonstration, verbal cues required, tactile cues required, and needs further education  HOME EXERCISE PROGRAM: Access Code: [email or web address] URL: [email or web address] Date: 08/16/2023 Prepared by: Lulu Riding  Program Notes for the foam roll routine exercise you can use 2 rolled up towels.   Exercises - Seated Gentle Upper Trapezius Stretch  - 1 x daily - 7 x weekly - 1 sets - 3 reps - 30 hold - Gentle Levator Scapulae Stretch  - 1 x daily - 7 x weekly - 3 sets - 3 reps - 30 hold - Seated Shoulder Flexion Slide at Table Top with Forearm in Neutral  - 1 x daily - 7 x weekly - 3 sets - 10 reps - Seated Shoulder Scaption Slide at Table Top with Forearm in Neutral  - 1 x daily - 7 x weekly - 3 sets - 10 reps - Supine chest press  - 1 x daily - 7 x weekly - 1-2 sets - 10 reps - Supine shoulder Flexion Extension AAROM with Dowel to comfortable height overhead  - 1 x daily - 7 x weekly - 1-2 sets - 10 reps - Supine Shoulder External Rotation with Dowel  - 1 x daily - 7 x weekly - 1-2 sets - 10 reps - Seated Bilateral  Shoulder External Rotation with Resistance  - 1 x daily - 7 x weekly - 2 sets - 10 reps - Scapular retraction with ER (MONEY)  - 1 x daily - 7 x weekly - 2 sets - 10 reps - First Rib Mobilization with Strap  - 1 x daily - 2 sets - 10 reps - 1-2 sec hold - Supine on Foam Roll Reach and Roll  - 1 x daily - 7 x weekly - 10 reps - 5 seconds hold - [place] on Foam Roll  - 1 x daily - 7 x weekly - 10 reps - Thoracic Y on Foam Roll  - 1 x daily - 7 x weekly - 1 reps - 20-30 seconds hold - Supine Static Chest Stretch on Foam Roll  - 1 x daily - 7 x weekly - 1 reps - 20-30 seconds hold - Hooklying Scapular  Protraction on Foam Roll  - 1 x daily - 7 x weekly - 10 reps  ASSESSMENT:  CLINICAL IMPRESSION: 08/16/2023 Patient reports continued L shoulder pain/ soreness. She did not some improvement with manual trigger point release last session. Continued trigger point release and IASTM techniques. She did well with modified foam roll technique which she demonstrated improvement with shoulder flexion on the L with the activities which was comparable to R shoulder movement, and reported the pain gradually improved with repetition. Pt declined modalities following session.     EVAL: Patient is a 89  y.o. female  who was seen today for physical therapy evaluation and treatment for  bil shoulder pain and presents with neck pain as well on left.  Order is for Right shoulder pain but Left shoulder is chief complaint and more restricted with also neck restrictions noted. Pt does present with myofascial pain component and states doing exercises with resistance last time she was in PT was painful.  Pt would like to return to more comfortable movement in order to care for her home, shop for groceries and sleep with less disruptions.  PT would benefit from skilled PT to address impairments and pain issue and be able to live alone with maximum function  OBJECTIVE IMPAIRMENTS: decreased ROM, decreased strength, impaired UE functional use, postural dysfunction, and pain.   ACTIVITY LIMITATIONS: lifting, sleeping, bathing, dressing, reach over head, hygiene/grooming, and housework   PARTICIPATION LIMITATIONS: meal prep, cleaning, laundry, and shopping  PERSONAL FACTORS:  See medical history and pertinent history/ myofascial component  are also affecting patient's functional outcome.   REHAB POTENTIAL: Good  CLINICAL DECISION MAKING: Evolving/moderate complexity  EVALUATION COMPLEXITY: Moderate   GOALS: Goals reviewed with patient? Yes  SHORT TERM GOALS: Target date: 08-09-23  Pt will be independent with initial  HEP 75% of time Baseline:no knowledge , not currently exercising 07/26/23: mod cues required  Goal status: ONGOING  2.  Demonstrate understanding of proper sitting posture, body mechanics, work ergonomics, and be more conscious of position and posture throughout the day.  Baseline: limited knowledge Goal status: INITIAL  3. Pt will learn strategies for encouraging more restful sleep and pain relief for increased restorative sleep at night  Baseline  sleeping in recliner  Goal status INITIAL    LONG TERM GOALS: Target date: 08-30-23  Ms Durwin Glaze will be I with advanced resistant exercise Baseline: limited knowledge Goal status: INITIAL  2.  Pt will be able to reach overhead for kitchen dishes and cook ware in order to care for her needs living alone with minimal pain Baseline: EVAL -Pt  restricted in motion bil UE Goal status: INITIAL  3.  Pt will be abel to reduced pain in Left shoulder by 50% Baseline: Pt left shoulder pain 8/10 to 10/10 at eval Goal status: INITIAL  4.  Pt will be able to utilize pain management techniques and understand condition in order to manage at home with HEP Baseline:limited knowledge Goal status: INITIAL  5.  Pt will demonstrate grip strength within 2lb of contralateral limb in order to indicate improved tolerance/independence with tasks requiring manual dexterity, with less than 2pt increase in pain on NPS. Baseline: see grip strength Goal status: INITIAL  6.  Pt will score less than or equal to 36 on Quick DASH in order to indicate reduced levels of disability due to shoulder pain (MDC 16-20pts). Baseline: 56.8% Goal status: INITIAL  PLAN:  PT FREQUENCY: 1-2x/week  PT DURATION: 6 weeks  PLANNED INTERVENTIONS: 97164- PT Re-evaluation, 97110-Therapeutic exercises, 97530- Therapeutic activity, [id number]- Neuromuscular re-education, 97535- Self Care, [id number]- Manual therapy, [id number]- Electrical stimulation (unattended), [id number]- Electrical stimulation  (manual), [id number]- Ionotophoresis 4mg /ml Dexamethasone, Patient/Family education, Taping, Dry Needling, Joint mobilization, Spinal mobilization, Cryotherapy, and Moist heat  PLAN FOR NEXT SESSION: Review HEP, AAROM > AROM, STW along upper trap and infra/supraspinatus, posterior chain activation.   Alfonsa Vaile PT, DPT, LAT, ATC  08/16/23  12:11 PM

## 2023-08-20 ENCOUNTER — Encounter: Payer: Self-pay | Admitting: Physical Therapy

## 2023-08-20 ENCOUNTER — Ambulatory Visit: Admitting: Physical Therapy

## 2023-08-20 DIAGNOSIS — G8929 Other chronic pain: Secondary | ICD-10-CM

## 2023-08-20 DIAGNOSIS — M6281 Muscle weakness (generalized): Secondary | ICD-10-CM

## 2023-08-20 DIAGNOSIS — M25511 Pain in right shoulder: Secondary | ICD-10-CM

## 2023-08-20 DIAGNOSIS — M542 Cervicalgia: Secondary | ICD-10-CM

## 2023-08-20 NOTE — Therapy (Signed)
 OUTPATIENT PHYSICAL THERAPY SHOULDER TREATMENT   Patient Name: Robin [name] MRN: [id number] DOB:[birth year], 89 y.o., female Today's Date: 08/20/2023  END OF SESSION:  PT End of Session - 08/20/23 1149     Visit Number 8    Number of Visits 13    Date for PT Re-Evaluation 08/30/23    Authorization Type [employer]    PT Start Time 1148    PT Stop Time 1230    PT Time Calculation (min) 42 min               Past Medical History:  Diagnosis Date   Abnormal Pap smear of cervix    Allergy    Arthritis    Asthma    Cataract 2018   bilateral eyes   Colon polyps    tubulovillous adenoma, 2010   External hemorrhoids without mention of complication    GERD (gastroesophageal reflux disease)    Headache    eval with neuro in 2011   Hyperlipemia    Hyperparathyroidism (HCC)    seeing Dr. Lucianne Muss in endocrinology   Hypertension    IBS (irritable bowel syndrome), GERD, hx colon polyps, chronic abd pain - followed by Dr. Jarold Motto in GI 06/11/2012   Osteoporosis 08/29/2011   Vertigo    eval with neuro in 2011, brief recurrence 2016   Past Surgical History:  Procedure Laterality Date   BACK SURGERY  1974   CARPAL TUNNEL RELEASE Left 06/2008   COLONOSCOPY     EYE SURGERY  07/2017   bilateral cataract extraction   POLYPECTOMY     TONSILLECTOMY     UPPER GASTROINTESTINAL ENDOSCOPY     Patient Active Problem List   Diagnosis Date Noted   HYPERCHOLESTEROLEMIA 01/05/2023   Peripheral neuropathy 11/30/2022   Osteoporosis of femur without pathological fracture 06/24/2019   Dyspnea 02/03/2019   Asthma 02/03/2019   Vertigo 06/26/2017   Hyperparathyroidism (HCC) 08/24/2014   Slow transit constipation 01/13/2014   IBS (irritable bowel syndrome), GERD, hx colon polyps, chronic abd pain - followed by Dr. Jarold Motto in GI 06/11/2012   Osteopenia 08/29/2011   Vitamin D deficiency 08/29/2011   GERD (gastroesophageal reflux disease) 11/23/2010   Dyslipidemia 09/09/2007    Hypertension, essential, benign 09/09/2007    PCP: Karie Georges, MD   REFERRING PROVIDER: Karie Georges, MD   REFERRING DIAG: M25.511 (ICD-10-CM) - Right shoulder pain, unspecified chronicity   THERAPY DIAG:  Acute pain of right shoulder  Chronic right shoulder pain  Cervicalgia  Muscle weakness  Rationale for Evaluation and Treatment: Rehabilitation  ONSET DATE: R shld pain  3 days ago, but left shoulder 3 weeks ago  SUBJECTIVE:  SUBJECTIVE STATEMENT: 08/20/23: I am getting better. Still have pain.   08/13/23:" I am doing fair, still feel pain pretty quick in the morning."  08/01/23: Shoulder is getting better little by little.   07/30/23: I think I am doing better. But the shoulder still needs a lot of work.   EVAL: I am here because I have pain in my left shoulder that started 3 weeks ago but I also have pain in my R shoulder that began 3 days ago.  I sleep on my R normally but now I sleep anyway I can and for a week I slept in a recliner. I am now back in  the bed and I sleep any way I can.  I can only sleep 2 hours as a time. I have trouble moving my neck as well.  I really have pain in my left shoulder. I wonder if my over using my right arm is causing pain in my left arm. I have about 8/10 pain in my left now.  Pain with any reaching activities.  Difficulty with bathing and clothing. Neck motion affects her pain radiating down arm Hand dominance: Right  PERTINENT HISTORY: HTN, back surgery 1975, carpal tunnel, vertigo, osteoporosis  PAIN:  Are you having pain? Yes: NPRS scale: L shld 5/10 at rest Pain location: R shld 0/10 Pain description: achy sharp Aggravating factors: any movement, sleeping, pulling up pants, carrying groceries Relieving factors: resting, hot shower, icy hot,  biofreeze  PRECAUTIONS: None  RED FLAGS: None   WEIGHT BEARING RESTRICTIONS: No  FALLS:  Has patient fallen in last 6 months? 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  DIAGNOSTIC FINDINGS:    FINDINGS: Limited evaluation due to overlapping osseous structures and overlying soft tissues. Multilevel degenerative changes. Possible multilevel osseous neural foraminal stenosis with limited evaluation due to positioning. There is no evidence of cervical spine fracture or prevertebral soft tissue swelling. Alignment is normal. No other significant bone abnormalities are identified.   IMPRESSION: No acute displaced fracture or traumatic listhesis of the cervical spine. Limited evaluation due to overlapping osseous structures and overlying soft tissues.     Electronically Signed   By: Tish Frederickson M.D.   On: 10/08/2022 00:28  RIGHT SHOULDER - 2+ VIEW   COMPARISON:  None Available.   FINDINGS: Mild AC joint degenerative change. No fracture or dislocation. No other bony abnormalities.   IMPRESSION: Mild AC joint degenerative change.     Electronically Signed   By: Gerome Sam III M.D.   On: 10/05/2022 13:33  PATIENT SURVEYS:  Neldon Mc 56.8% 08/16/23 40.9%  COGNITION: Overall cognitive status: Within functional limits for tasks assessed     SENSATION: Pt with radiating pain from neck into Right shoulder  POSTURE: Posture forward head and flexed trunk / was sleeping in recliner and recently returned to bed   CERVICAL ROM:   Active ROM A/PROM (deg) eval  Flexion 35   Extension 15*  Right lateral flexion 22*  Left lateral flexion 23*  Right rotation 33 *  Left rotation 40   (Blank rows = not tested  N= WNL * concordant)  UPPER EXTREMITY ROM:   A ROM/PROM Right eval Left eval Left  PROM 07/26/23 Left 07/30/23 Right 08/01/23 AROM Left AROM 08/01/23 Right AROM 08/07/23 Left  AROM 08/07/23 Left AROM Left  08/20/23 Right  08/20/23  Shoulder flexion 115 95 neck upper shld pain 95 AA 100 supine 120 115 AA supine  125 AROM  seated 115 AROM seated  95 AROM seated 125 125  Shoulder extension             Shoulder abduction 80 Pain on attempt < 60 80 Seated AA 90 100      100 105  Shoulder adduction             Shoulder internal rotation Mid buttock To ischial tuberosity           Shoulder external rotation To neck  with pain To clavicle with pain     T2  To ear with neck pain  C2  C2  Elbow flexion             Elbow extension             Wrist flexion             Wrist extension             Wrist ulnar deviation             Wrist radial deviation             Wrist pronation             Wrist supination             (Blank rows = not tested)  UPPER EXTREMITY MMT:  MMT Right eval Left eval  Shoulder flexion 4- 3-  Shoulder extension    Shoulder abduction 3- 3-  Shoulder adduction    Shoulder internal rotation 4- 3-  Shoulder external rotation 4- 3-  Middle trapezius    Lower trapezius    Elbow flexion    Elbow extension    Wrist flexion    Wrist extension    Wrist ulnar deviation    Wrist radial deviation    Wrist pronation    Wrist supination    Grip strength (lbs) 23.3 lb 13.3 lb   (Blank rows = not tested, score listed is out of 5 possible points.  N = WNL, D = diminished, C = clear for gross weakness with myotome testing, * = concordant pain with testing)   SHOULDER SPECIAL TESTS: Spurling (+) for neck pain,not radicular  JOINT MOBILITY TESTING:  Hypomobile in Cervical spine  PALPATION:  TTP over left upper  trap/supraspinatus, R shoulder global pain   [facility] Adult PT Treatment:                                                DATE: 08/20/23  UBE L1 6 min total  YTB bilat ER  10 x 2  Seated YTB Horizontal Abdct 10 x 2  Red band seated Row x 15  Wall ladder shoulder flexion Left x 7 Standing IR Yellow 10 x 2 each  Reclined dowel raise chest press to flexion  Recline dowel pullover , lap to OH  Reclined horiz red band  Reclined PNF D2 5-8 reps each   [facility] Adult PT Treatment:  DATE: 08/16/23 Manual trigger point release for the L upper trap.  IASTM techniques for L upper trap in sitting.  First rib mobs.  UBE L1 x 4 min (FWD/BWD x 2 min ea) Seated L upper trap stretch Foam roll routine ceiling punches, horizontal add/ abduction, back stroke, x to y Reviewed/ updated HEP  [facility] Adult PT Treatment:                                                DATE: 08/13/23 MTPR along the middle deltoid Taught how to use Theracane and ways do it at home Upper trap stretch 2 x 30 sec, 1 x bil PROM flexion/ abduction with ossicaliatoins to help reduce pian AP glenohumeral mobs grade III, inferior mobs grade III Scapular protraction with dowel rod - tactile and verbal cues to promote scapular protraction Seated shoulder ER/IR bil AAROM with dowel Seated rows 2 x 12 bil YTB Scapular retraction with ER bil with YTB 2 x 10  [facility] Adult PT Treatment:                                                DATE: 08/07/23 Therapeutic Exercise: Seated ER bilat YTB 10 x 2  Seated bilat scap retract -red bands with handles  10 x 2  Supine YTB horiz abdct x 10  Therapeutic Activity: Supine bilat chest press with dowel x 15 Supine bilat pullover from chest height 2 x 10 Supine bilat ER AAROM with dowel- elbows supported with rolled towels 10 x 2  Supine right AROM shoulder flexion long lever 2 x 10 Supine right AROM shoulder short lever 2 x 10 Seated UE ranger for left shoulder  scaption , also shoulder flexion to horizontal abduction- increased pain / bone rubbing Modalities:  Moist heat to left shoulder x 10 min                                                                                                         PATIENT EDUCATION: Education details: POC, Explanation of findings, TPDN and issue HEP Person educated: Patient Education method: Explanation, Demonstration, Tactile cues, Verbal cues, and Handouts Education comprehension: verbalized understanding, returned demonstration, verbal cues required, tactile cues required, and needs further education  HOME EXERCISE PROGRAM: Access Code: [email or web address] URL: [email or web address] Date: 08/16/2023 Prepared by: Lulu Riding  Program Notes for the foam roll routine exercise you can use 2 rolled up towels.   Exercises - Seated Gentle Upper Trapezius Stretch  - 1 x daily - 7 x weekly - 1 sets - 3 reps - 30 hold - Gentle Levator Scapulae Stretch  - 1 x daily - 7 x weekly - 3 sets - 3 reps - 30 hold - Seated Shoulder Flexion Slide at Table Top with Forearm in Neutral  - 1  x daily - 7 x weekly - 3 sets - 10 reps - Seated Shoulder Scaption Slide at Table Top with Forearm in Neutral  - 1 x daily - 7 x weekly - 3 sets - 10 reps - Supine chest press  - 1 x daily - 7 x weekly - 1-2 sets - 10 reps - Supine shoulder Flexion Extension AAROM with Dowel to comfortable height overhead  - 1 x daily - 7 x weekly - 1-2 sets - 10 reps - Supine Shoulder External Rotation with Dowel  - 1 x daily - 7 x weekly - 1-2 sets - 10 reps - Seated Bilateral Shoulder External Rotation with Resistance  - 1 x daily - 7 x weekly - 2 sets - 10 reps - Scapular retraction with ER (MONEY)  - 1 x daily - 7 x weekly - 2 sets - 10 reps - First Rib Mobilization with Strap  - 1 x daily - 2 sets - 10 reps - 1-2 sec hold - Supine on Foam Roll Reach and Roll  - 1 x daily - 7 x weekly - 10 reps - 5 seconds hold - [place] on Foam Roll  - 1 x  daily - 7 x weekly - 10 reps - Thoracic Y on Foam Roll  - 1 x daily - 7 x weekly - 1 reps - 20-30 seconds hold - Supine Static Chest Stretch on Foam Roll  - 1 x daily - 7 x weekly - 1 reps - 20-30 seconds hold - Hooklying Scapular Protraction on Foam Roll  - 1 x daily - 7 x weekly - 10 reps  ASSESSMENT:  CLINICAL IMPRESSION: 08/20/2023 Patient reports continued L shoulder pain/ soreness although improved. Her AROM is improving. Continues to struggle with reach behind head, although also improved. Continued with AAROM and gentle shoulder strengthening.    EVAL: Patient is a 89  y.o. female  who was seen today for physical therapy evaluation and treatment for  bil shoulder pain and presents with neck pain as well on left.  Order is for Right shoulder pain but Left shoulder is chief complaint and more restricted with also neck restrictions noted. Pt does present with myofascial pain component and states doing exercises with resistance last time she was in PT was painful.  Pt would like to return to more comfortable movement in order to care for her home, shop for groceries and sleep with less disruptions.  PT would benefit from skilled PT to address impairments and pain issue and be able to live alone with maximum function  OBJECTIVE IMPAIRMENTS: decreased ROM, decreased strength, impaired UE functional use, postural dysfunction, and pain.   ACTIVITY LIMITATIONS: lifting, sleeping, bathing, dressing, reach over head, hygiene/grooming, and housework   PARTICIPATION LIMITATIONS: meal prep, cleaning, laundry, and shopping  PERSONAL FACTORS:  See medical history and pertinent history/ myofascial component  are also affecting patient's functional outcome.   REHAB POTENTIAL: Good  CLINICAL DECISION MAKING: Evolving/moderate complexity  EVALUATION COMPLEXITY: Moderate   GOALS: Goals reviewed with patient? Yes  SHORT TERM GOALS: Target date: 08-09-23  Pt will be independent with initial HEP 75% of  time Baseline:no knowledge , not currently exercising 07/26/23: mod cues required  Goal status: ONGOING  2.  Demonstrate understanding of proper sitting posture, body mechanics, work ergonomics, and be more conscious of position and posture throughout the day.  Baseline: limited knowledge Goal status: INITIAL  3. Pt will learn strategies for encouraging more restful sleep and pain relief  for increased restorative sleep at night  Baseline  sleeping in recliner  Goal status INITIAL    LONG TERM GOALS: Target date: 08-30-23  Robin [name] will be I with advanced resistant exercise Baseline: limited knowledge Goal status: INITIAL  2.  Pt will be able to reach overhead for kitchen dishes and cook ware in order to care for her needs living alone with minimal pain Baseline: EVAL -Pt restricted in motion bil UE Goal status: INITIAL  3.  Pt will be abel to reduced pain in Left shoulder by 50% Baseline: Pt left shoulder pain 8/10 to 10/10 at eval Goal status: INITIAL  4.  Pt will be able to utilize pain management techniques and understand condition in order to manage at home with HEP Baseline:limited knowledge Goal status: INITIAL  5.  Pt will demonstrate grip strength within 2lb of contralateral limb in order to indicate improved tolerance/independence with tasks requiring manual dexterity, with less than 2pt increase in pain on NPS. Baseline: see grip strength Goal status: INITIAL  6.  Pt will score less than or equal to 36 on Quick DASH in order to indicate reduced levels of disability due to shoulder pain (MDC 16-20pts). Baseline: 56.8% Goal status: INITIAL  PLAN:  PT FREQUENCY: 1-2x/week  PT DURATION: 6 weeks  PLANNED INTERVENTIONS: 97164- PT Re-evaluation, 97110-Therapeutic exercises, 97530- Therapeutic activity, [id number]- Neuromuscular re-education, 97535- Self Care, 16109- Manual therapy, [id number]- Electrical stimulation (unattended), [id number]- Electrical stimulation (manual), [id number]-  Ionotophoresis 4mg /ml Dexamethasone, Patient/Family education, Taping, Dry Needling, Joint mobilization, Spinal mobilization, Cryotherapy, and Moist heat  PLAN FOR NEXT SESSION: Review HEP, AAROM > AROM, STW along upper trap and infra/supraspinatus, posterior chain activation.   Jannette Spanner, PTA 08/20/23 12:36 PM Phone: [phone] Fax: [phone]

## 2023-08-22 NOTE — Therapy (Signed)
 OUTPATIENT PHYSICAL THERAPY SHOULDER TREATMENT   Patient Name: Robin [name] MRN: [id number] DOB:10/15/1933, 89 y.o., female Today's Date: 08/22/2023  END OF SESSION:      Past Medical History:  Diagnosis Date   Abnormal Pap smear of cervix    Allergy    Arthritis    Asthma    Cataract 2018   bilateral eyes   Colon polyps    tubulovillous adenoma, 2010   External hemorrhoids without mention of complication    GERD (gastroesophageal reflux disease)    Headache    eval with neuro in 2011   Hyperlipemia    Hyperparathyroidism (HCC)    seeing Dr. Lucianne Muss in endocrinology   Hypertension    IBS (irritable bowel syndrome), GERD, hx colon polyps, chronic abd pain - followed by Dr. Jarold Motto in GI 06/11/2012   Osteoporosis 08/29/2011   Vertigo    eval with neuro in 2011, brief recurrence 2016   Past Surgical History:  Procedure Laterality Date   BACK SURGERY  1974   CARPAL TUNNEL RELEASE Left 06/2008   COLONOSCOPY     EYE SURGERY  07/2017   bilateral cataract extraction   POLYPECTOMY     TONSILLECTOMY     UPPER GASTROINTESTINAL ENDOSCOPY     Patient Active Problem List   Diagnosis Date Noted   HYPERCHOLESTEROLEMIA 01/05/2023   Peripheral neuropathy 11/30/2022   Osteoporosis of femur without pathological fracture 06/24/2019   Dyspnea 02/03/2019   Asthma 02/03/2019   Vertigo 06/26/2017   Hyperparathyroidism (HCC) 08/24/2014   Slow transit constipation 01/13/2014   IBS (irritable bowel syndrome), GERD, hx colon polyps, chronic abd pain - followed by Dr. Jarold Motto in GI 06/11/2012   Osteopenia 08/29/2011   Vitamin D deficiency 08/29/2011   GERD (gastroesophageal reflux disease) 11/23/2010   Dyslipidemia 09/09/2007   Hypertension, essential, benign 09/09/2007    PCP: Karie Georges, MD   REFERRING PROVIDER: Karie Georges, MD   REFERRING DIAG: M25.511 (ICD-10-CM) - Right shoulder pain, unspecified chronicity   THERAPY DIAG:  No diagnosis  found.  Rationale for Evaluation and Treatment: Rehabilitation  ONSET DATE: R shld pain  3 days ago, but left shoulder 3 weeks ago  SUBJECTIVE:                                                                                                                                                                                      SUBJECTIVE STATEMENT: 08/20/23: I am getting better. Still have pain.   08/13/23:" I am doing fair, still feel pain pretty quick in the morning."  08/01/23: Shoulder is getting better little by little.   07/30/23: I think I am doing  better. But the shoulder still needs a lot of work.   EVAL: I am here because I have pain in my left shoulder that started 3 weeks ago but I also have pain in my R shoulder that began 3 days ago.  I sleep on my R normally but now I sleep anyway I can and for a week I slept in a recliner. I am now back in  the bed and I sleep any way I can.  I can only sleep 2 hours as a time. I have trouble moving my neck as well.  I really have pain in my left shoulder. I wonder if my over using my right arm is causing pain in my left arm. I have about 8/10 pain in my left now.  Pain with any reaching activities.  Difficulty with bathing and clothing. Neck motion affects her pain radiating down arm Hand dominance: Right  PERTINENT HISTORY: HTN, back surgery 1975, carpal tunnel, vertigo, osteoporosis  PAIN:  Are you having pain? Yes: NPRS scale: L shld 5/10 at rest Pain location: R shld 0/10 Pain description: achy sharp Aggravating factors: any movement, sleeping, pulling up pants, carrying groceries Relieving factors: resting, hot shower, icy hot, biofreeze  PRECAUTIONS: None  RED FLAGS: None   WEIGHT BEARING RESTRICTIONS: No  FALLS:  Has patient fallen in last 6 months? No  LIVING ENVIRONMENT: Lives with: lives alone Lives in: House/apartment Stairs: Yes: External: 2 steps; none Has following equipment at home: Retail banker - 2  wheeled  OCCUPATION: retired  PLOF: Independent  PATIENT GOALS:Be able to sleep normally and take care of myself, reduce pain  NEXT MD VISIT:   OBJECTIVE:  Note: Objective measures were completed at Evaluation unless otherwise noted.  DIAGNOSTIC FINDINGS:    FINDINGS: Limited evaluation due to overlapping osseous structures and overlying soft tissues. Multilevel degenerative changes. Possible multilevel osseous neural foraminal stenosis with limited evaluation due to positioning. There is no evidence of cervical spine fracture or prevertebral soft tissue swelling. Alignment is normal. No other significant bone abnormalities are identified.   IMPRESSION: No acute displaced fracture or traumatic listhesis of the cervical spine. Limited evaluation due to overlapping osseous structures and overlying soft tissues.     Electronically Signed   By: Tish Frederickson M.D.   On: 10/08/2022 00:28  RIGHT SHOULDER - 2+ VIEW   COMPARISON:  None Available.   FINDINGS: Mild AC joint degenerative change. No fracture or dislocation. No other bony abnormalities.   IMPRESSION: Mild AC joint degenerative change.     Electronically Signed   By: Gerome Sam III M.D.   On: 10/05/2022 13:33  PATIENT SURVEYS:  Neldon Mc 56.8% 08/16/23 40.9%  COGNITION: Overall cognitive status: Within functional limits for tasks assessed     SENSATION: Pt with radiating pain from neck into Right shoulder  POSTURE: Posture forward head and flexed trunk / was sleeping in recliner and recently returned to bed   CERVICAL ROM:   Active ROM A/PROM (deg) eval  Flexion 35  Extension 15*  Right lateral flexion 22*  Left lateral flexion 23*  Right rotation 33 *  Left rotation 40   (Blank rows = not tested  N= WNL * concordant)  UPPER EXTREMITY ROM:   A ROM/PROM Right eval Left eval Left  PROM 07/26/23 Left 07/30/23 Right 08/01/23 AROM Left AROM 08/01/23 Right AROM 08/07/23 Left   AROM 08/07/23 Left AROM Left  08/20/23 Right  08/20/23  Shoulder flexion 115  95 neck upper shld pain 95 AA 100 supine 120 115 AA supine  125 AROM  seated 115 AROM seated  95 AROM seated 125 125  Shoulder extension             Shoulder abduction 80 Pain on attempt < 60 80 Seated AA 90 100      100 105  Shoulder adduction             Shoulder internal rotation Mid buttock To ischial tuberosity           Shoulder external rotation To neck  with pain To clavicle with pain     T2  To ear with neck pain  C2  C2  Elbow flexion             Elbow extension             Wrist flexion             Wrist extension             Wrist ulnar deviation             Wrist radial deviation             Wrist pronation             Wrist supination             (Blank rows = not tested)  UPPER EXTREMITY MMT:  MMT Right eval Left eval  Shoulder flexion 4- 3-  Shoulder extension    Shoulder abduction 3- 3-  Shoulder adduction    Shoulder internal rotation 4- 3-  Shoulder external rotation 4- 3-  Middle trapezius    Lower trapezius    Elbow flexion    Elbow extension    Wrist flexion    Wrist extension    Wrist ulnar deviation    Wrist radial deviation    Wrist pronation    Wrist supination    Grip strength (lbs) 23.3 lb 13.3 lb   (Blank rows = not tested, score listed is out of 5 possible points.  N = WNL, D = diminished, C = clear for gross weakness with myotome testing, * = concordant pain with testing)   SHOULDER SPECIAL TESTS: Spurling (+) for neck pain,not radicular  JOINT MOBILITY TESTING:  Hypomobile in Cervical spine  PALPATION:  TTP over left upper trap/supraspinatus, R shoulder global pain  [facility] Adult PT Treatment:                                                DATE: 08-23-23 Therapeutic Exercise: *** Manual Therapy: *** Neuromuscular re-ed: *** Therapeutic Activity: *** Modalities: *** Self Care: ***  [facility] Adult PT Treatment:                                                 DATE: 08/20/23  UBE L1 6 min total  YTB bilat ER  10 x 2  Seated YTB Horizontal Abdct 10 x 2  Red band seated Row x 15  Wall ladder shoulder flexion Left x 7 Standing IR Yellow 10 x 2 each  Reclined dowel raise chest press to flexion  Recline dowel pullover , lap to [facility]  Reclined horiz red band  Reclined PNF D2 5-8 reps each   [facility] Adult PT Treatment:                                                DATE: 08/16/23 Manual trigger point release for the L upper trap.  IASTM techniques for L upper trap in sitting.  First rib mobs.  UBE L1 x 4 min (FWD/BWD x 2 min ea) Seated L upper trap stretch Foam roll routine ceiling punches, horizontal add/ abduction, back stroke, x to y Reviewed/ updated HEP  [facility] Adult PT Treatment:                                                DATE: 08/13/23 MTPR along the middle deltoid Taught how to use Theracane and ways do it at home Upper trap stretch 2 x 30 sec, 1 x bil PROM flexion/ abduction with ossicaliatoins to help reduce pian AP glenohumeral mobs grade III, inferior mobs grade III Scapular protraction with dowel rod - tactile and verbal cues to promote scapular protraction Seated shoulder ER/IR bil AAROM with dowel Seated rows 2 x 12 bil YTB Scapular retraction with ER bil with YTB 2 x 10  [facility] Adult PT Treatment:                                                DATE: 08/07/23 Therapeutic Exercise: Seated ER bilat YTB 10 x 2  Seated bilat scap retract -red bands with handles  10 x 2  Supine YTB horiz abdct x 10  Therapeutic Activity: Supine bilat chest press with dowel x 15 Supine bilat pullover from chest height 2 x 10 Supine bilat ER AAROM with dowel- elbows supported with rolled towels 10 x 2  Supine right AROM shoulder flexion long lever 2 x 10 Supine right AROM shoulder short lever 2 x 10 Seated UE ranger for left shoulder scaption , also shoulder flexion to horizontal abduction- increased pain / bone rubbing Modalities:  Moist heat to  left shoulder x 10 min                                                                                                         PATIENT EDUCATION: Education details: POC, Explanation of findings, TPDN and issue HEP Person educated: Patient Education method: Explanation, Demonstration, Tactile cues, Verbal cues, and Handouts Education comprehension: verbalized understanding, returned demonstration, verbal cues required, tactile cues required, and needs further education  HOME EXERCISE PROGRAM: Access Code: [email or web address] URL: [email or web address] Date: 08/16/2023 Prepared by: Lulu Riding  Program Notes for the foam roll routine exercise you can use 2 rolled up towels.  Exercises - Seated Gentle Upper Trapezius Stretch  - 1 x daily - 7 x weekly - 1 sets - 3 reps - 30 hold - Gentle Levator Scapulae Stretch  - 1 x daily - 7 x weekly - 3 sets - 3 reps - 30 hold - Seated Shoulder Flexion Slide at Table Top with Forearm in Neutral  - 1 x daily - 7 x weekly - 3 sets - 10 reps - Seated Shoulder Scaption Slide at Table Top with Forearm in Neutral  - 1 x daily - 7 x weekly - 3 sets - 10 reps - Supine chest press  - 1 x daily - 7 x weekly - 1-2 sets - 10 reps - Supine shoulder Flexion Extension AAROM with Dowel to comfortable height overhead  - 1 x daily - 7 x weekly - 1-2 sets - 10 reps - Supine Shoulder External Rotation with Dowel  - 1 x daily - 7 x weekly - 1-2 sets - 10 reps - Seated Bilateral Shoulder External Rotation with Resistance  - 1 x daily - 7 x weekly - 2 sets - 10 reps - Scapular retraction with ER (MONEY)  - 1 x daily - 7 x weekly - 2 sets - 10 reps - First Rib Mobilization with Strap  - 1 x daily - 2 sets - 10 reps - 1-2 sec hold - Supine on Foam Roll Reach and Roll  - 1 x daily - 7 x weekly - 10 reps - 5 seconds hold - [place] on Foam Roll  - 1 x daily - 7 x weekly - 10 reps - Thoracic Y on Foam Roll  - 1 x daily - 7 x weekly - 1 reps - 20-30 seconds hold -  Supine Static Chest Stretch on Foam Roll  - 1 x daily - 7 x weekly - 1 reps - 20-30 seconds hold - Hooklying Scapular Protraction on Foam Roll  - 1 x daily - 7 x weekly - 10 reps  ASSESSMENT:  CLINICAL IMPRESSION: 08/22/2023 Patient reports continued L shoulder pain/ soreness although improved. Her AROM is improving. Continues to struggle with reach behind head, although also improved. Continued with AAROM and gentle shoulder strengthening.    EVAL: Patient is a 89  y.o. female  who was seen today for physical therapy evaluation and treatment for  bil shoulder pain and presents with neck pain as well on left.  Order is for Right shoulder pain but Left shoulder is chief complaint and more restricted with also neck restrictions noted. Pt does present with myofascial pain component and states doing exercises with resistance last time she was in PT was painful.  Pt would like to return to more comfortable movement in order to care for her home, shop for groceries and sleep with less disruptions.  PT would benefit from skilled PT to address impairments and pain issue and be able to live alone with maximum function  OBJECTIVE IMPAIRMENTS: decreased ROM, decreased strength, impaired UE functional use, postural dysfunction, and pain.   ACTIVITY LIMITATIONS: lifting, sleeping, bathing, dressing, reach over head, hygiene/grooming, and housework   PARTICIPATION LIMITATIONS: meal prep, cleaning, laundry, and shopping  PERSONAL FACTORS:  See medical history and pertinent history/ myofascial component  are also affecting patient's functional outcome.   REHAB POTENTIAL: Good  CLINICAL DECISION MAKING: Evolving/moderate complexity  EVALUATION COMPLEXITY: Moderate   GOALS: Goals reviewed with patient? Yes  SHORT TERM GOALS: Target date: 08-09-23  Pt will be independent with initial HEP  75% of time Baseline:no knowledge , not currently exercising 07/26/23: mod cues required  Goal status: ONGOING  2.   Demonstrate understanding of proper sitting posture, body mechanics, work ergonomics, and be more conscious of position and posture throughout the day.  Baseline: limited knowledge Goal status: INITIAL  3. Pt will learn strategies for encouraging more restful sleep and pain relief for increased restorative sleep at night  Baseline  sleeping in recliner  Goal status INITIAL    LONG TERM GOALS: Target date: 08-30-23  Ms Durwin Glaze will be I with advanced resistant exercise Baseline: limited knowledge Goal status: INITIAL  2.  Pt will be able to reach overhead for kitchen dishes and cook ware in order to care for her needs living alone with minimal pain Baseline: EVAL -Pt restricted in motion bil UE Goal status: INITIAL  3.  Pt will be abel to reduced pain in Left shoulder by 50% Baseline: Pt left shoulder pain 8/10 to 10/10 at eval Goal status: INITIAL  4.  Pt will be able to utilize pain management techniques and understand condition in order to manage at home with HEP Baseline:limited knowledge Goal status: INITIAL  5.  Pt will demonstrate grip strength within 2lb of contralateral limb in order to indicate improved tolerance/independence with tasks requiring manual dexterity, with less than 2pt increase in pain on NPS. Baseline: see grip strength Goal status: INITIAL  6.  Pt will score less than or equal to 36 on Quick DASH in order to indicate reduced levels of disability due to shoulder pain (MDC 16-20pts). Baseline: 56.8% Goal status: INITIAL  PLAN:  PT FREQUENCY: 1-2x/week  PT DURATION: 6 weeks  PLANNED INTERVENTIONS: 97164- PT Re-evaluation, 97110-Therapeutic exercises, 97530- Therapeutic activity, [id number]- Neuromuscular re-education, 97535- Self Care, 09811- Manual therapy, G0283- Electrical stimulation (unattended), [id number]- Electrical stimulation (manual), [id number]- Ionotophoresis 4mg /ml Dexamethasone, Patient/Family education, Taping, Dry Needling, Joint mobilization, Spinal  mobilization, Cryotherapy, and Moist heat  PLAN FOR NEXT SESSION: Review HEP, AAROM > AROM, STW along upper trap and infra/supraspinatus, posterior chain activation.   ***

## 2023-08-23 ENCOUNTER — Encounter: Payer: Self-pay | Admitting: Physical Therapy

## 2023-08-23 ENCOUNTER — Ambulatory Visit: Admitting: Physical Therapy

## 2023-08-23 DIAGNOSIS — M6281 Muscle weakness (generalized): Secondary | ICD-10-CM

## 2023-08-23 DIAGNOSIS — M25511 Pain in right shoulder: Secondary | ICD-10-CM | POA: Diagnosis not present

## 2023-08-23 DIAGNOSIS — G8929 Other chronic pain: Secondary | ICD-10-CM

## 2023-08-23 DIAGNOSIS — M542 Cervicalgia: Secondary | ICD-10-CM

## 2023-08-23 NOTE — Patient Instructions (Addendum)
 IONTOPHORESIS PATIENT PRECAUTIONS & CONTRAINDICATIONS: Dexamethasone patch Redness under one or both electrodes can occur.  This characterized by a uniform redness that usually disappears within 12 hours of treatment. Small pinhead size blisters may result in response to the drug.  Contact your physician if the problem persists more than 24 hours. On rare occasions, iontophoresis therapy can result in temporary skin reactions such as rash, inflammation, irritation or burns.  The skin reactions may be the result of individual sensitivity to the ionic solution used, the condition of the skin at the start of treatment, reaction to the materials in the electrodes, allergies or sensitivity to dexamethasone, or a poor connection between the patch and your skin.  Discontinue using iontophoresis if you have any of these reactions and report to your therapist. Remove the Patch or electrodes if you have any undue sensation of pain or burning during the treatment and report discomfort to your therapist. Tell your Therapist if you have had known adverse reactions to the application of electrical current. If using the Patch, the LED light will turn off when treatment is complete and the patch can be removed.  Approximate treatment time is 1-3 hours.  Remove the patch when light goes off or after 6 hours. The Patch can be worn during normal activity, however excessive motion where the electrodes have been placed can cause poor contact between the skin and the electrode or uneven electrical current resulting in greater risk of skin irritation. Keep out of the reach of children.   DO NOT use if you have a cardiac pacemaker or any other electrically sensitive implanted device. DO NOT use if you have a known sensitivity to dexamethasone. DO NOT use during Magnetic Resonance Imaging (MRI). DO NOT use over broken or compromised skin (e.g. sunburn, cuts, or acne) due to the increased risk of skin reaction. DO NOT  SHAVE over the area to be treated:  To establish good contact between the Patch and the skin, excessive hair may be clipped. DO NOT place the Patch or electrodes on or over your eyes, directly over your heart, or brain. DO NOT reuse the Patch or electrodes as this may cause burns to occur.  Garen Lah, PT, ATRIC Certified Exercise Expert for the Aging Adult  08/23/23 11:35 AM Phone: [phone] Fax: [phone]

## 2023-08-27 ENCOUNTER — Ambulatory Visit: Admitting: Physical Therapy

## 2023-08-27 ENCOUNTER — Encounter: Payer: Self-pay | Admitting: Physical Therapy

## 2023-08-27 DIAGNOSIS — M25511 Pain in right shoulder: Secondary | ICD-10-CM

## 2023-08-27 DIAGNOSIS — G8929 Other chronic pain: Secondary | ICD-10-CM

## 2023-08-27 NOTE — Therapy (Signed)
 OUTPATIENT PHYSICAL THERAPY SHOULDER TREATMENT   Patient Name: Robin [name] MRN: [id number] DOB:01/07/1934, 89 y.o., female Today's Date: 08/27/2023  END OF SESSION:  PT End of Session - 08/27/23 1057     Visit Number 9    Number of Visits 13    Date for PT Re-Evaluation 08/30/23    Authorization Type [employer]    PT Start Time 1100    PT Stop Time 1200    PT Time Calculation (min) 60 min                Past Medical History:  Diagnosis Date   Abnormal Pap smear of cervix    Allergy    Arthritis    Asthma    Cataract 2018   bilateral eyes   Colon polyps    tubulovillous adenoma, 2010   External hemorrhoids without mention of complication    GERD (gastroesophageal reflux disease)    Headache    eval with neuro in 2011   Hyperlipemia    Hyperparathyroidism (HCC)    seeing Dr. Hubert Madden in endocrinology   Hypertension    IBS (irritable bowel syndrome), GERD, hx colon polyps, chronic abd pain - followed by Dr. Adan Holms in GI 06/11/2012   Osteoporosis 08/29/2011   Vertigo    eval with neuro in 2011, brief recurrence 2016   Past Surgical History:  Procedure Laterality Date   BACK SURGERY  1974   CARPAL TUNNEL RELEASE Left 06/2008   COLONOSCOPY     EYE SURGERY  07/2017   bilateral cataract extraction   POLYPECTOMY     TONSILLECTOMY     UPPER GASTROINTESTINAL ENDOSCOPY     Patient Active Problem List   Diagnosis Date Noted   HYPERCHOLESTEROLEMIA 01/05/2023   Peripheral neuropathy 11/30/2022   Osteoporosis of femur without pathological fracture 06/24/2019   Dyspnea 02/03/2019   Asthma 02/03/2019   Vertigo 06/26/2017   Hyperparathyroidism (HCC) 08/24/2014   Slow transit constipation 01/13/2014   IBS (irritable bowel syndrome), GERD, hx colon polyps, chronic abd pain - followed by Dr. Adan Holms in GI 06/11/2012   Osteopenia 08/29/2011   Vitamin D deficiency 08/29/2011   GERD (gastroesophageal reflux disease) 11/23/2010   Dyslipidemia 09/09/2007    Hypertension, essential, benign 09/09/2007    PCP: Aida House, MD   REFERRING PROVIDER: Aida House, MD   REFERRING DIAG: M25.511 (ICD-10-CM) - Right shoulder pain, unspecified chronicity   THERAPY DIAG:  Acute pain of right shoulder  Chronic right shoulder pain  Rationale for Evaluation and Treatment: Rehabilitation  ONSET DATE: R shld pain  3 days ago, but left shoulder 3 weeks ago  SUBJECTIVE:  SUBJECTIVE STATEMENT:  08/27/23: I can reach further and do more with my left arm now. It is so sore now though. Not pain like it was. The patch did not help at all.   08-23-23 I am getting better  4/10 in left shoulder  08/20/23: I am getting better. Still have pain.   08/13/23:" I am doing fair, still feel pain pretty quick in the morning."  08/01/23: Shoulder is getting better little by little.   07/30/23: I think I am doing better. But the shoulder still needs a lot of work.   EVAL: I am here because I have pain in my left shoulder that started 3 weeks ago but I also have pain in my R shoulder that began 3 days ago.  I sleep on my R normally but now I sleep anyway I can and for a week I slept in a recliner. I am now back in  the bed and I sleep any way I can.  I can only sleep 2 hours as a time. I have trouble moving my neck as well.  I really have pain in my left shoulder. I wonder if my over using my right arm is causing pain in my left arm. I have about 8/10 pain in my left now.  Pain with any reaching activities.  Difficulty with bathing and clothing. Neck motion affects her pain radiating down arm Hand dominance: Right  PERTINENT HISTORY: HTN, back surgery 1975, carpal tunnel, vertigo, osteoporosis  PAIN:  Are you having pain? Yes: NPRS scale: L shld 5/10 at rest Pain location: R shld  0/10 Pain description: achy sharp Aggravating factors: any movement, sleeping, pulling up pants, carrying groceries Relieving factors: resting, hot shower, icy hot, biofreeze  PRECAUTIONS: None  RED FLAGS: None   WEIGHT BEARING RESTRICTIONS: No  FALLS:  Has patient fallen in last 6 months? 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  DIAGNOSTIC FINDINGS:    FINDINGS: Limited evaluation due to overlapping osseous structures and overlying soft tissues. Multilevel degenerative changes. Possible multilevel osseous neural foraminal stenosis with limited evaluation due to positioning. There is no evidence of cervical spine fracture or prevertebral soft tissue swelling. Alignment is normal. No other significant bone abnormalities are identified.   IMPRESSION: No acute displaced fracture or traumatic listhesis of the cervical spine. Limited evaluation due to overlapping osseous structures and overlying soft tissues.     Electronically Signed   By: Morgane  Naveau M.D.   On: 10/08/2022 00:28  RIGHT SHOULDER - 2+ VIEW   COMPARISON:  None Available.   FINDINGS: Mild AC joint degenerative change. No fracture or dislocation. No other bony abnormalities.   IMPRESSION: Mild AC joint degenerative change.     Electronically Signed   By: Lorrene Rosser III M.D.   On: 10/05/2022 13:33  PATIENT SURVEYS:  Robin [name] 56.8% 08/16/23 40.9%  COGNITION: Overall cognitive status: Within functional limits for tasks assessed     SENSATION: Pt with radiating pain from neck into Right  shoulder  POSTURE: Posture forward head and flexed trunk / was sleeping in recliner and recently returned to bed   CERVICAL ROM:  Active ROM A/PROM (deg) eval  Flexion 35  Extension 15*  Right lateral flexion 22*  Left lateral flexion 23*  Right rotation 33 *  Left rotation 40   (Blank rows = not tested  N= WNL * concordant)  UPPER EXTREMITY ROM:   A ROM/PROM Right eval Left eval Left  PROM 07/26/23 Left 07/30/23 Right 08/01/23 AROM Left AROM 08/01/23 Right AROM 08/07/23 Left  AROM 08/07/23 Left AROM Left  08/20/23 Right  08/20/23  Shoulder flexion 115 95 neck upper shld pain 95 AA 100 supine 120 115 AA supine  125 AROM  seated 115 AROM seated  95 AROM seated 125 125  Shoulder extension             Shoulder abduction 80 Pain on attempt < 60 80 Seated AA 90 100      100 105  Shoulder adduction             Shoulder internal rotation Mid buttock To ischial tuberosity           Shoulder external rotation To neck  with pain To clavicle with pain     T2  To ear with neck pain  C2  C2  Elbow flexion             Elbow extension             Wrist flexion             Wrist extension             Wrist ulnar deviation             Wrist radial deviation             Wrist pronation             Wrist supination             (Blank rows = not tested)  UPPER EXTREMITY MMT:  MMT Right eval Left eval Right 08/27/23 Left 08/27/23  Shoulder flexion 4- 3-  3  Shoulder extension      Shoulder abduction 3- 3-    Shoulder adduction      Shoulder internal rotation 4- 3-    Shoulder external rotation 4- 3-    Middle trapezius      Lower trapezius      Elbow flexion      Elbow extension      Wrist flexion      Wrist extension      Wrist ulnar deviation      Wrist radial deviation      Wrist pronation      Wrist supination      Grip strength (lbs) 23.3 lb 13.3 lb  23   (Blank rows = not tested, score listed is out of 5 possible points.  N = WNL, D = diminished, C = clear for  gross weakness with myotome testing, * = concordant pain with testing)   SHOULDER SPECIAL TESTS: Spurling (+) for neck pain,not radicular  JOINT MOBILITY TESTING:  Hypomobile in Cervical spine  PALPATION:  TTP over left upper trap/supraspinatus, R shoulder global pain   [facility] Adult PT Treatment:                                                DATE: 08/27/23 Therapeutic Exercise:  IR RTB 10 x 2  each  ROW GTB Ext Red  Bilat ER RTB  Reclined Horizontal abduction RED band x 10   Therapeutic Activity: UBE L1 2.5 min each way  Wall ladder flexion and scaption UBE level 1 2.5 min each way  Inclined dowel shoulder AA lap to OH x 10  Modalities Estim (IFC) to tolerance x 15 min  HMP x 15 minutes     [facility] Adult PT Treatment:                                                DATE: 08-23-23 Therapeutic Exercise: UBE L1 6 min total  level 2.5  RTB bilat ER  10 x 2  Standing bil Extension with RTB  3 x 10  Reclined dowel raise chest press to flexion  Recline dowel pullover , lap to OH  Reclined horiz red band  Reclined PNF D2 5-8 reps each  Manual Therapy: Gentle mobilization of left shoulder STW over left biceps and deltoids  Modalities: Iontophoresis patch 4-6 hour Dexamethasone Location: Left biceps proximal ant shouder Dose: 1cc Time: 4-6 hour patch   [facility] Adult PT Treatment:                                                DATE: 08/20/23  UBE L1 6 min total  YTB bilat ER  10 x 2  Seated YTB Horizontal Abdct 10 x 2  Red band seated Row x 15  Wall ladder shoulder flexion Left x 7 Standing IR Yellow 10 x 2 each  Reclined dowel raise chest press to flexion  Recline dowel pullover , lap to OH  Reclined horiz red band  Reclined PNF D2 5-8 reps each   [facility] Adult PT Treatment:                                                DATE: 08/16/23 Manual trigger point release for the L upper trap.  IASTM techniques for L upper trap in sitting.  First rib mobs.  UBE L1 x 4 min (FWD/BWD x 2  min ea) Seated L upper trap stretch Foam roll routine ceiling punches, horizontal add/ abduction, back stroke, x to y Reviewed/ updated HEP  [facility] Adult PT Treatment:                                                DATE: 08/13/23 MTPR along the middle deltoid Taught how to use Theracane and ways do it at home Upper trap stretch 2 x 30 sec, 1 x bil PROM flexion/ abduction with ossicaliatoins to help reduce pian AP glenohumeral mobs grade III, inferior mobs grade III Scapular protraction with dowel rod - tactile and verbal cues to promote scapular protraction Seated shoulder ER/IR bil AAROM with dowel Seated rows 2 x 12 bil YTB Scapular retraction with ER bil with YTB 2 x 10  PATIENT EDUCATION: Education details: POC, Explanation of findings, TPDN and issue HEP Person educated: Patient Education method: Explanation, Demonstration, Tactile cues, Verbal cues, and Handouts Education comprehension: verbalized understanding, returned demonstration, verbal cues required, tactile cues required, and needs further education  HOME EXERCISE PROGRAM: Access Code: [email or web address] URL: [email or web address] Date: 08/16/2023 Prepared by: Laron Plummer  Program Notes for the foam roll routine exercise you can use 2 rolled up towels.   Exercises - Seated Gentle Upper Trapezius Stretch  - 1 x daily - 7 x weekly - 1 sets - 3 reps - 30 hold - Gentle Levator Scapulae Stretch  - 1 x daily - 7 x weekly - 3 sets - 3 reps - 30 hold - Seated Shoulder Flexion Slide at Table Top with Forearm in Neutral  - 1 x daily - 7 x weekly - 3 sets - 10 reps - Seated Shoulder Scaption Slide at Table Top with Forearm in Neutral  - 1 x daily - 7 x weekly - 3 sets - 10 reps - Supine chest press  - 1 x daily - 7 x weekly - 1-2 sets - 10 reps - Supine shoulder Flexion Extension AAROM with Dowel to comfortable  height overhead  - 1 x daily - 7 x weekly - 1-2 sets - 10 reps - Supine Shoulder External Rotation with Dowel  - 1 x daily - 7 x weekly - 1-2 sets - 10 reps - Seated Bilateral Shoulder External Rotation with Resistance  - 1 x daily - 7 x weekly - 2 sets - 10 reps - Scapular retraction with ER (MONEY)  - 1 x daily - 7 x weekly - 2 sets - 10 reps - First Rib Mobilization with Strap  - 1 x daily - 2 sets - 10 reps - 1-2 sec hold - Supine on Foam Roll Reach and Roll  - 1 x daily - 7 x weekly - 10 reps - 5 seconds hold - [place] on Foam Roll  - 1 x daily - 7 x weekly - 10 reps - Thoracic Y on Foam Roll  - 1 x daily - 7 x weekly - 1 reps - 20-30 seconds hold - Supine Static Chest Stretch on Foam Roll  - 1 x daily - 7 x weekly - 1 reps - 20-30 seconds hold - Hooklying Scapular Protraction on Foam Roll  - 1 x daily - 7 x weekly - 10 reps  ASSESSMENT:  CLINICAL IMPRESSION: 08/27/2023 Patient reports continued L shoulder soreness more than pain. Ionto did not help and also left a small open wound on her anterior shoulder. She does report improved ability to reach and perform tasks with left arm. Her grip strength has improved.   Pt educated on Estim and agreed to try for improvement of soreness after minimal improvement with STW or Ionto.  Continued with AAROM and gentle shoulder strengthening. Pt was given handout about TENS purchase and she reported improvement afterward. She has one more visit in POC and will discuss possible extension on POC at next visit.    EVAL: Patient is a 89  y.o. female  who was seen today for physical therapy evaluation and treatment for  bil shoulder pain and presents with neck pain as well on left.  Order is for Right shoulder pain but Left shoulder is chief complaint and more restricted with also neck restrictions noted. Pt does present with myofascial pain component and states doing exercises with resistance last time she was in PT was  painful.  Pt would like to return to  more comfortable movement in order to care for her home, shop for groceries and sleep with less disruptions.  PT would benefit from skilled PT to address impairments and pain issue and be able to live alone with maximum function  OBJECTIVE IMPAIRMENTS: decreased ROM, decreased strength, impaired UE functional use, postural dysfunction, and pain.   ACTIVITY LIMITATIONS: lifting, sleeping, bathing, dressing, reach over head, hygiene/grooming, and housework   PARTICIPATION LIMITATIONS: meal prep, cleaning, laundry, and shopping  PERSONAL FACTORS:  See medical history and pertinent history/ myofascial component  are also affecting patient's functional outcome.   REHAB POTENTIAL: Good  CLINICAL DECISION MAKING: Evolving/moderate complexity  EVALUATION COMPLEXITY: Moderate   GOALS: Goals reviewed with patient? Yes  SHORT TERM GOALS: Target date: 08-09-23  Pt will be independent with initial HEP 75% of time Baseline:no knowledge , not currently exercising 07/26/23: mod cues required  Goal status: ONGOING  2.  Demonstrate understanding of proper sitting posture, body mechanics, work ergonomics, and be more conscious of position and posture throughout the day.  Baseline: limited knowledge Goal status: MET  3. Pt will learn strategies for encouraging more restful sleep and pain relief for increased restorative sleep at night  Baseline  sleeping in recliner  Goal status MET    LONG TERM GOALS: Target date: 08-30-23  Robin [name] will be I with advanced resistant exercise Baseline: limited knowledge Goal status: INITIAL  2.  Pt will be able to reach overhead for kitchen dishes and cook ware in order to care for her needs living alone with minimal pain Baseline: EVAL -Pt restricted in motion bil UE Goal status: INITIAL  3.  Pt will be abel to reduced pain in Left shoulder by 50% Baseline: Pt left shoulder pain 8/10 to 10/10 at eval Goal status: INITIAL  4.  Pt will be able to utilize  pain management techniques and understand condition in order to manage at home with HEP Baseline:limited knowledge Goal status: INITIAL  5.  Pt will demonstrate grip strength within 2lb of contralateral limb in order to indicate improved tolerance/independence with tasks requiring manual dexterity, with less than 2pt increase in pain on NPS. Baseline: see grip strength 08/27/23: see chart Goal status: MET  6.  Pt will score less than or equal to 36 on Quick DASH in order to indicate reduced levels of disability due to shoulder pain (MDC 16-20pts). Baseline: 56.8% Goal status: INITIAL  PLAN:  PT FREQUENCY: 1-2x/week  PT DURATION: 6 weeks  PLANNED INTERVENTIONS: 97164- PT Re-evaluation, 97110-Therapeutic exercises, 97530- Therapeutic activity, [id number]- Neuromuscular re-education, 97535- Self Care, 62130- Manual therapy, [id number]- Electrical stimulation (unattended), [id number]- Electrical stimulation (manual), [id number]- Ionotophoresis 4mg /ml Dexamethasone, Patient/Family education, Taping, Dry Needling, Joint mobilization, Spinal mobilization, Cryotherapy, and Moist heat  PLAN FOR NEXT SESSION: Review HEP, AAROM > AROM, STW along upper trap and infra/supraspinatus, posterior chain activation.   Gasper Karst, PTA 08/27/23 12:31 PM Phone: [phone] Fax: [phone]

## 2023-08-29 NOTE — Therapy (Signed)
 OUTPATIENT PHYSICAL THERAPY SHOULDER TREATMENT/ERO   Patient Name: Robin [name] MRN: [id number] DOB:12/26/1933, 89 y.o., female Today's Date: 08/29/2023  END OF SESSION:       Past Medical History:  Diagnosis Date   Abnormal Pap smear of cervix    Allergy    Arthritis    Asthma    Cataract 2018   bilateral eyes   Colon polyps    tubulovillous adenoma, 2010   External hemorrhoids without mention of complication    GERD (gastroesophageal reflux disease)    Headache    eval with neuro in 2011   Hyperlipemia    Hyperparathyroidism (HCC)    seeing Dr. Hubert Madden in endocrinology   Hypertension    IBS (irritable bowel syndrome), GERD, hx colon polyps, chronic abd pain - followed by Dr. Adan Holms in GI 06/11/2012   Osteoporosis 08/29/2011   Vertigo    eval with neuro in 2011, brief recurrence 2016   Past Surgical History:  Procedure Laterality Date   BACK SURGERY  1974   CARPAL TUNNEL RELEASE Left 06/2008   COLONOSCOPY     EYE SURGERY  07/2017   bilateral cataract extraction   POLYPECTOMY     TONSILLECTOMY     UPPER GASTROINTESTINAL ENDOSCOPY     Patient Active Problem List   Diagnosis Date Noted   HYPERCHOLESTEROLEMIA 01/05/2023   Peripheral neuropathy 11/30/2022   Osteoporosis of femur without pathological fracture 06/24/2019   Dyspnea 02/03/2019   Asthma 02/03/2019   Vertigo 06/26/2017   Hyperparathyroidism (HCC) 08/24/2014   Slow transit constipation 01/13/2014   IBS (irritable bowel syndrome), GERD, hx colon polyps, chronic abd pain - followed by Dr. Adan Holms in GI 06/11/2012   Osteopenia 08/29/2011   Vitamin D deficiency 08/29/2011   GERD (gastroesophageal reflux disease) 11/23/2010   Dyslipidemia 09/09/2007   Hypertension, essential, benign 09/09/2007    PCP: Aida House, MD   REFERRING PROVIDER: Aida House, MD   REFERRING DIAG: M25.511 (ICD-10-CM) - Right shoulder pain, unspecified chronicity   THERAPY DIAG:  No diagnosis  found.  Rationale for Evaluation and Treatment: Rehabilitation  ONSET DATE: R shld pain  3 days ago, but left shoulder 3 weeks ago  SUBJECTIVE:                                                                                                                                                                                      SUBJECTIVE STATEMENT:  08/27/23: I can reach further and do more with my left arm now. It is so sore now though. Not pain like it was. The patch did not help at all.   08-23-23 I am getting better  4/10 in left shoulder  08/20/23: I am getting better. Still have pain.   08/13/23:" I am doing fair, still feel pain pretty quick in the morning."  08/01/23: Shoulder is getting better little by little.   07/30/23: I think I am doing better. But the shoulder still needs a lot of work.   EVAL: I am here because I have pain in my left shoulder that started 3 weeks ago but I also have pain in my R shoulder that began 3 days ago.  I sleep on my R normally but now I sleep anyway I can and for a week I slept in a recliner. I am now back in  the bed and I sleep any way I can.  I can only sleep 2 hours as a time. I have trouble moving my neck as well.  I really have pain in my left shoulder. I wonder if my over using my right arm is causing pain in my left arm. I have about 8/10 pain in my left now.  Pain with any reaching activities.  Difficulty with bathing and clothing. Neck motion affects her pain radiating down arm Hand dominance: Right  PERTINENT HISTORY: HTN, back surgery 1975, carpal tunnel, vertigo, osteoporosis  PAIN:  Are you having pain? Yes: NPRS scale: L shld 5/10 at rest Pain location: R shld 0/10 Pain description: achy sharp Aggravating factors: any movement, sleeping, pulling up pants, carrying groceries Relieving factors: resting, hot shower, icy hot, biofreeze  PRECAUTIONS: None  RED FLAGS: None   WEIGHT BEARING RESTRICTIONS: No  FALLS:  Has patient fallen in  last 6 months? 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  DIAGNOSTIC FINDINGS:    FINDINGS: Limited evaluation due to overlapping osseous structures and overlying soft tissues. Multilevel degenerative changes. Possible multilevel osseous neural foraminal stenosis with limited evaluation due to positioning. There is no evidence of cervical spine fracture or prevertebral soft tissue swelling. Alignment is normal. No other significant bone abnormalities are identified.   IMPRESSION: No acute displaced fracture or traumatic listhesis of the cervical spine. Limited evaluation due to overlapping osseous structures and overlying soft tissues.     Electronically Signed   By: Tish Frederickson M.D.   On: 10/08/2022 00:28  RIGHT SHOULDER - 2+ VIEW   COMPARISON:  None Available.   FINDINGS: Mild AC joint degenerative change. No fracture or dislocation. No other bony abnormalities.   IMPRESSION: Mild AC joint degenerative change.     Electronically Signed   By: Gerome Sam III M.D.   On: 10/05/2022 13:33  PATIENT SURVEYS:  Neldon Mc 56.8% 08/16/23 40.9%  COGNITION: Overall cognitive status: Within functional limits for tasks assessed     SENSATION: Pt with radiating pain from neck into Right shoulder  POSTURE: Posture forward head and flexed trunk / was sleeping in recliner and recently returned to bed   CERVICAL ROM:   Active ROM A/PROM (deg) eval  Flexion 35  Extension 15*  Right lateral flexion 22*  Left lateral flexion 23*  Right rotation 33 *  Left rotation 40   (Blank rows =  not tested  N= WNL *  concordant)  UPPER EXTREMITY ROM:   A ROM/PROM Right eval Left eval Left  PROM 07/26/23 Left 07/30/23 Right 08/01/23 AROM Left AROM 08/01/23 Right AROM 08/07/23 Left  AROM 08/07/23 Left AROM Left  08/20/23 Right  08/20/23  Shoulder flexion 115 95 neck upper shld pain 95 AA 100 supine 120 115 AA supine  125 AROM  seated 115 AROM seated  95 AROM seated 125 125  Shoulder extension             Shoulder abduction 80 Pain on attempt < 60 80 Seated AA 90 100      100 105  Shoulder adduction             Shoulder internal rotation Mid buttock To ischial tuberosity           Shoulder external rotation To neck  with pain To clavicle with pain     T2  To ear with neck pain  C2  C2  Elbow flexion             Elbow extension             Wrist flexion             Wrist extension             Wrist ulnar deviation             Wrist radial deviation             Wrist pronation             Wrist supination             (Blank rows = not tested)  UPPER EXTREMITY MMT:  MMT Right eval Left eval Right 08/27/23 Left 08/27/23  Shoulder flexion 4- 3-  3  Shoulder extension      Shoulder abduction 3- 3-    Shoulder adduction      Shoulder internal rotation 4- 3-    Shoulder external rotation 4- 3-    Middle trapezius      Lower trapezius      Elbow flexion      Elbow extension      Wrist flexion      Wrist extension      Wrist ulnar deviation      Wrist radial deviation      Wrist pronation      Wrist supination      Grip strength (lbs) 23.3 lb 13.3 lb  23   (Blank rows = not tested, score listed is out of 5 possible points.  N = WNL, D = diminished, C = clear for gross weakness with myotome testing, * = concordant pain with testing)   SHOULDER SPECIAL TESTS: Spurling (+) for neck pain,not radicular  JOINT MOBILITY TESTING:  Hypomobile in Cervical spine  PALPATION:  TTP over left upper trap/supraspinatus, R shoulder global pain   [facility] Adult PT Treatment:                                                 DATE: 08/27/23 Therapeutic Exercise:  IR RTB 10 x 2 each  ROW GTB Ext Red  Bilat ER RTB  Reclined Horizontal abduction RED band x 10   Therapeutic Activity: UBE L1 2.5 min each way  Wall ladder flexion and scaption UBE level 1 2.5 min each way  Inclined dowel shoulder AA lap to OH x 10  Modalities Estim (IFC) to tolerance x 15 min  HMP x 15 minutes     [facility] Adult PT Treatment:                                                DATE: 08-23-23 Therapeutic Exercise: UBE L1 6 min total  level 2.5  RTB bilat ER  10 x 2  Standing bil Extension with RTB  3 x 10  Reclined dowel raise chest press to flexion  Recline dowel pullover , lap to OH  Reclined horiz red band  Reclined PNF D2 5-8 reps each  Manual Therapy: Gentle mobilization of left shoulder STW over left biceps and deltoids  Modalities: Iontophoresis patch 4-6 hour Dexamethasone Location: Left biceps proximal ant shouder Dose: 1cc Time: 4-6 hour patch   [facility] Adult PT Treatment:                                                DATE: 08/20/23  UBE L1 6 min total  YTB bilat ER  10 x 2  Seated YTB Horizontal Abdct 10 x 2  Red band seated Row x 15  Wall ladder shoulder flexion Left x 7 Standing IR Yellow 10 x 2 each  Reclined dowel raise chest press to flexion  Recline dowel pullover , lap to OH  Reclined horiz red band  Reclined PNF D2 5-8 reps each   [facility] Adult PT Treatment:                                                DATE: 08/16/23 Manual trigger point release for the L upper trap.  IASTM techniques for L upper trap in sitting.  First rib mobs.  UBE L1 x 4 min (FWD/BWD x 2 min ea) Seated L upper trap stretch Foam roll routine ceiling punches, horizontal add/ abduction, back stroke, x to y Reviewed/ updated HEP  [facility] Adult PT Treatment:                                                DATE: 08/13/23 MTPR along the middle deltoid Taught how to use Theracane and ways do it at  home Upper trap stretch 2 x 30 sec, 1 x bil PROM flexion/ abduction with ossicaliatoins to help reduce pian AP glenohumeral mobs grade III, inferior mobs grade III Scapular protraction with dowel rod - tactile and verbal cues to promote scapular protraction Seated shoulder ER/IR bil AAROM with dowel Seated rows 2 x 12 bil YTB Scapular retraction with ER bil with YTB 2 x 10  PATIENT EDUCATION: Education details: POC, Explanation of findings, TPDN and issue HEP Person educated: Patient Education method: Explanation, Demonstration, Tactile cues, Verbal cues, and Handouts Education comprehension: verbalized understanding, returned demonstration, verbal cues required, tactile cues required, and needs further education  HOME EXERCISE PROGRAM: Access Code: [email or web address] URL: [email or web address] Date: 08/16/2023 Prepared by: Laron Plummer  Program Notes for the foam roll routine exercise you can use 2 rolled up towels.   Exercises - Seated Gentle Upper Trapezius Stretch  - 1 x daily - 7 x weekly - 1 sets - 3 reps - 30 hold - Gentle Levator Scapulae Stretch  - 1 x daily - 7 x weekly - 3 sets - 3 reps - 30 hold - Seated Shoulder Flexion Slide at Table Top with Forearm in Neutral  - 1 x daily - 7 x weekly - 3 sets - 10 reps - Seated Shoulder Scaption Slide at Table Top with Forearm in Neutral  - 1 x daily - 7 x weekly - 3 sets - 10 reps - Supine chest press  - 1 x daily - 7 x weekly - 1-2 sets - 10 reps - Supine shoulder Flexion Extension AAROM with Dowel to comfortable height overhead  - 1 x daily - 7 x weekly - 1-2 sets - 10 reps - Supine Shoulder External Rotation with Dowel  - 1 x daily - 7 x weekly - 1-2 sets - 10 reps - Seated Bilateral Shoulder External Rotation with Resistance  - 1 x daily - 7 x weekly - 2 sets - 10 reps - Scapular retraction with ER (MONEY)  - 1 x  daily - 7 x weekly - 2 sets - 10 reps - First Rib Mobilization with Strap  - 1 x daily - 2 sets - 10 reps - 1-2 sec hold - Supine on Foam Roll Reach and Roll  - 1 x daily - 7 x weekly - 10 reps - 5 seconds hold - [place] on Foam Roll  - 1 x daily - 7 x weekly - 10 reps - Thoracic Y on Foam Roll  - 1 x daily - 7 x weekly - 1 reps - 20-30 seconds hold - Supine Static Chest Stretch on Foam Roll  - 1 x daily - 7 x weekly - 1 reps - 20-30 seconds hold - Hooklying Scapular Protraction on Foam Roll  - 1 x daily - 7 x weekly - 10 reps  ASSESSMENT:  CLINICAL IMPRESSION: 08/29/2023 Patient reports continued L shoulder soreness more than pain. Ionto did not help and also left a small open wound on her anterior shoulder. She does report improved ability to reach and perform tasks with left arm. Her grip strength has improved.   Pt educated on Estim and agreed to try for improvement of soreness after minimal improvement with STW or Ionto.  Continued with AAROM and gentle shoulder strengthening. Pt was given handout about TENS purchase and she reported improvement afterward. She has one more visit in POC and will discuss possible extension on POC at next visit.    EVAL: Patient is a 89  y.o. female  who was seen today for physical therapy evaluation and treatment for  bil shoulder pain and presents with neck pain as well on left.  Order is for Right shoulder pain but Left shoulder is chief complaint and more restricted with also neck restrictions noted. Pt does present with myofascial pain component and states doing exercises with resistance last time she was in PT was  painful.  Pt would like to return to more comfortable movement in order to care for her home, shop for groceries and sleep with less disruptions.  PT would benefit from skilled PT to address impairments and pain issue and be able to live alone with maximum function  OBJECTIVE IMPAIRMENTS: decreased ROM, decreased strength, impaired UE functional  use, postural dysfunction, and pain.   ACTIVITY LIMITATIONS: lifting, sleeping, bathing, dressing, reach over head, hygiene/grooming, and housework   PARTICIPATION LIMITATIONS: meal prep, cleaning, laundry, and shopping  PERSONAL FACTORS:  See medical history and pertinent history/ myofascial component  are also affecting patient's functional outcome.   REHAB POTENTIAL: Good  CLINICAL DECISION MAKING: Evolving/moderate complexity  EVALUATION COMPLEXITY: Moderate   GOALS: Goals reviewed with patient? Yes  SHORT TERM GOALS: Target date: 08-09-23  Pt will be independent with initial HEP 75% of time Baseline:no knowledge , not currently exercising 07/26/23: mod cues required  Goal status: ONGOING  2.  Demonstrate understanding of proper sitting posture, body mechanics, work ergonomics, and be more conscious of position and posture throughout the day.  Baseline: limited knowledge Goal status: MET  3. Pt will learn strategies for encouraging more restful sleep and pain relief for increased restorative sleep at night  Baseline  sleeping in recliner  Goal status MET    LONG TERM GOALS: Target date: 08-30-23  Ms Ruffus Couch will be I with advanced resistant exercise Baseline: limited knowledge Goal status: INITIAL  2.  Pt will be able to reach overhead for kitchen dishes and cook ware in order to care for her needs living alone with minimal pain Baseline: EVAL -Pt restricted in motion bil UE Goal status: INITIAL  3.  Pt will be abel to reduced pain in Left shoulder by 50% Baseline: Pt left shoulder pain 8/10 to 10/10 at eval Goal status: INITIAL  4.  Pt will be able to utilize pain management techniques and understand condition in order to manage at home with HEP Baseline:limited knowledge Goal status: INITIAL  5.  Pt will demonstrate grip strength within 2lb of contralateral limb in order to indicate improved tolerance/independence with tasks requiring manual dexterity, with less  than 2pt increase in pain on NPS. Baseline: see grip strength 08/27/23: see chart Goal status: MET  6.  Pt will score less than or equal to 36 on Quick DASH in order to indicate reduced levels of disability due to shoulder pain (MDC 16-20pts). Baseline: 56.8% Goal status: INITIAL  PLAN:  PT FREQUENCY: 1-2x/week  PT DURATION: 6 weeks  PLANNED INTERVENTIONS: 97164- PT Re-evaluation, 97110-Therapeutic exercises, 97530- Therapeutic activity, [id number]- Neuromuscular re-education, 97535- Self Care, [id number]- Manual therapy, G0283- Electrical stimulation (unattended), [id number]- Electrical stimulation (manual), [id number]- Ionotophoresis 4mg /ml Dexamethasone, Patient/Family education, Taping, Dry Needling, Joint mobilization, Spinal mobilization, Cryotherapy, and Moist heat  PLAN FOR NEXT SESSION: Review HEP, AAROM > AROM, STW along upper trap and infra/supraspinatus, posterior chain activation.   ***

## 2023-08-30 ENCOUNTER — Ambulatory Visit: Admitting: Physical Therapy

## 2023-08-30 ENCOUNTER — Encounter: Payer: Self-pay | Admitting: Physical Therapy

## 2023-08-30 DIAGNOSIS — M25511 Pain in right shoulder: Secondary | ICD-10-CM

## 2023-08-30 DIAGNOSIS — M6281 Muscle weakness (generalized): Secondary | ICD-10-CM

## 2023-08-30 DIAGNOSIS — G8929 Other chronic pain: Secondary | ICD-10-CM

## 2023-08-30 DIAGNOSIS — M542 Cervicalgia: Secondary | ICD-10-CM

## 2023-08-30 NOTE — Patient Instructions (Signed)
 TENS stands for Transcutaneous Electrical Nerve Stimulation. In other words, electrical impulses are allowed to pass through the skin in order to excite a nerve.   Purpose and Use of TENS:  TENS is a method used to manage acute and chronic pain without the use of drugs. It has been effective in managing pain associated with surgery, sprains, strains, trauma, rheumatoid arthritis, and neuralgias. It is a non-addictive, low risk, and non-invasive technique used to control pain. It is not, by any means, a curative form of treatment.   How TENS Works:  Most TENS units are a Statistician unit powered by one 9 volt battery. Attached to the outside of the unit are two lead wires where two pins and/or snaps connect on each wire. All units come with a set of four reusable pads or electrodes. These are placed on the skin surrounding the area involved. By inserting the leads into  the pads, the electricity can pass from the unit making the circuit complete.  As the intensity is turned up slowly, the electrical current enters the body from the electrodes through the skin to the surrounding nerve fibers. This triggers the release of hormones from within the body. These hormones contain pain relievers. By increasing the circulation of these hormones, the person's pain may be lessened. It is also believed that the electrical stimulation itself helps to block the pain messages being sent to the brain, thus also decreasing the body's perception of pain.   Hazards:  TENS units are NOT to be used by patients with PACEMAKERS, DEFIBRILLATORS, DIABETIC PUMPS, PREGNANT WOMEN, and patients with SEIZURE DISORDERS.  TENS units are NOT to be used over the heart, throat, brain, or spinal cord.  One of the major side effects from the TENS unit may be skin irritation. Some people may develop a rash if they are sensitive to the materials used in the electrodes or the connecting wires.     Avoid overuse due the body getting  used to the stem making it not as effective over time.   Sharlet Dawson, PT, A[facility] Certified Exercise Expert for the Aging Adult  08/30/23 11:40 AM Phone: [phone] Fax: [phone]

## 2023-09-05 ENCOUNTER — Telehealth: Payer: Self-pay | Admitting: *Deleted

## 2023-09-05 NOTE — Telephone Encounter (Signed)
 See prior phone note from 3/24 as patient was informed and stated she will consider this and may call back.

## 2023-09-05 NOTE — Telephone Encounter (Signed)
-----   Message from Aida House sent at 08/26/2023  9:30 AM EDT ----- Regarding: FW: check insurance for Evenity and Prolia Please let patient know that Prolia for her osteoporos is covered and please set her up if she is agreeable. Thanks! ----- Message ----- From: Doll French, CMA Sent: 07/24/2023  10:52 AM EDT To: Aida House, MD Subject: RE: check insurance for [place] and Prolia     I did it for Prolia. Evenity would be $400+ per month since she has [employer]. Evenity is only really affordable for pt's that have plain medicare since their deductible is so low. ----- Message ----- From: Aida House, MD Sent: 07/24/2023  10:44 AM EDT To: Doll French, CMA Subject: check insurance for Evenity and Prolia         Good morning! Pt has osteoporosis and I would like to check her benefits to see if her insurance will cover evenity and/ or prolia. Thanks!

## 2023-10-18 ENCOUNTER — Other Ambulatory Visit: Payer: Self-pay | Admitting: Family Medicine

## 2023-10-18 DIAGNOSIS — G6289 Other specified polyneuropathies: Secondary | ICD-10-CM

## 2024-01-08 NOTE — Progress Notes (Signed)
 HPI: FU dizziness and hypertension. ABIs August 2010 normal. Stress echo February 2014 normal. MRI July 2018 showed chronic small vessel ischemic changes in the cerebral white matter and pons. MRA without significant vertebrobasilar disease. CTA showed normal circle of Willis. EEG also normal. Patient has chronic mild dizziness. CTA September 2020 showed no pulmonary embolus. There was note of coronary artery disease/aortic atherosclerosis. Monitor January 2023 showed sinus rhythm with PACs, brief PAT, PVCs and rare couplet.  Echocardiogram October 2024 showed ejection fraction 70 to 75%, mild left ventricular hypertrophy, grade 1 diastolic dysfunction, trace aortic insufficiency.  Since last seen, she has some dyspnea on exertion but no orthopnea, PND, pedal edema, chest pain or syncope.  Current Outpatient Medications  Medication Sig Dispense Refill   carvedilol  (COREG ) 3.125 MG tablet TAKE 1 TABLET BY MOUTH TWICE DAILY WITH A MEAL 180 tablet 3   cetirizine  (ZYRTEC ) 10 MG tablet Take 10 mg by mouth daily.     Cholecalciferol  25 MCG (1000 UT) TBDP Take 1,000 Units by mouth daily. 90 tablet 3   clobetasol  ointment (TEMOVATE ) 0.05 % Apply to affected area sparingly at night twice weekly as needed. 60 g 0   esomeprazole  (NEXIUM ) 40 MG capsule TAKE 1 CAPSULE BY MOUTH TWICE DAILY BEFORE A MEAL 180 capsule 1   ezetimibe  (ZETIA ) 10 MG tablet TAKE 1 TABLET BY MOUTH DAILY 90 tablet 1   furosemide  (LASIX ) 20 MG tablet 1 tablet by mouth every other day as needed for swelling 45 tablet 3   hydrALAZINE  (APRESOLINE ) 25 MG tablet TAKE 1/2 TABLET BY MOUTH TWICE DAILY 90 tablet 3   lactulose  (CHRONULAC ) 10 GM/15ML solution Take 15 mLs (10 g total) by mouth daily. 473 mL 3   lidocaine  (XYLOCAINE ) 5 % ointment Apply 1 Application topically as directed.     loperamide  (IMODIUM ) 2 MG capsule Take 1 capsule (2 mg total) by mouth 4 (four) times daily as needed for diarrhea or loose stools. 12 capsule 0   magnesium   oxide (MAG-OX) 400 (240 Mg) MG tablet Take 400 mg by mouth daily.     rosuvastatin  (CRESTOR ) 5 MG tablet Take 0.5 tablets (2.5 mg total) by mouth daily. 45 tablet 1   spironolactone  (ALDACTONE ) 25 MG tablet TAKE 1 TABLET BY MOUTH TWICE DAILY 180 tablet 3   acetaminophen  (TYLENOL ) 500 MG tablet Take 500-1,000 mg by mouth every 6 (six) hours as needed for mild pain or headache.      AMBULATORY NON FORMULARY MEDICATION Medication Name: GI Cocktail - 90 ml 2% Lidocaine , 90 ml Dicyclomine  10mg /60ml, 270 ml Maalox - Take 5-10 ml every 6 hour as needed. 450 mL 3   amitriptyline  (ELAVIL ) 25 MG tablet TAKE 1 TABLET BY MOUTH DAILY AT BEDTIME 30 tablet 2   baclofen  (LIORESAL ) 10 MG tablet Take 5 mg by mouth 2 (two) times daily as needed. (Patient not taking: Reported on 01/21/2024)     ondansetron  (ZOFRAN ) 4 MG tablet Take 1 tablet (4 mg total) by mouth every 8 (eight) hours as needed for nausea or vomiting. (Patient not taking: Reported on 01/21/2024) 30 tablet 5   No current facility-administered medications for this visit.     Past Medical History:  Diagnosis Date   Abnormal Pap smear of cervix    Allergy    Arthritis    Asthma    Cataract 2018   bilateral eyes   Colon polyps    tubulovillous adenoma, 2010   External hemorrhoids without mention of complication  GERD (gastroesophageal reflux disease)    Headache    eval with neuro in 2011   Hyperlipemia    Hyperparathyroidism (HCC)    seeing Dr. Von in endocrinology   Hypertension    IBS (irritable bowel syndrome), GERD, hx colon polyps, chronic abd pain - followed by Dr. Jakie in GI 06/11/2012   Osteoporosis 08/29/2011   Vertigo    eval with neuro in 2011, brief recurrence 2016    Past Surgical History:  Procedure Laterality Date   BACK SURGERY  1974   CARPAL TUNNEL RELEASE Left 06/2008   COLONOSCOPY     EYE SURGERY  07/2017   bilateral cataract extraction   POLYPECTOMY     TONSILLECTOMY     UPPER GASTROINTESTINAL ENDOSCOPY       Social History   Socioeconomic History   Marital status: Widowed    Spouse name: Not on file   Number of children: 2   Years of education: Not on file   Highest education level: Not on file  Occupational History   Occupation: Engineer, structural    Comment: retired    Associate Professor: RETIRED  Tobacco Use   Smoking status: Never   Smokeless tobacco: Never  Vaping Use   Vaping status: Never Used  Substance and Sexual Activity   Alcohol use: No    Alcohol/week: 0.0 standard drinks of alcohol   Drug use: No   Sexual activity: Not Currently    Birth control/protection: Post-menopausal    Comment: GCG MCR LR, less than 5, after 18, no abnormal pap, no std, no des  Other Topics Concern   Not on file  Social History Narrative   Lives alone in a one story home.  Has 2 children, one son and one daughter with 2 grandkids and 1 great-grandchild.    Retired from working for a bank.     Education: high school.    Social Drivers of Corporate investment banker Strain: Low Risk  (01/05/2023)   Overall Financial Resource Strain (CARDIA)    Difficulty of Paying Living Expenses: Not hard at all  Food Insecurity: No Food Insecurity (01/05/2023)   Hunger Vital Sign    Worried About Running Out of Food in the Last Year: Never true    Ran Out of Food in the Last Year: Never true  Transportation Needs: No Transportation Needs (01/05/2023)   PRAPARE - Administrator, Civil Service (Medical): No    Lack of Transportation (Non-Medical): No  Physical Activity: Insufficiently Active (01/05/2023)   Exercise Vital Sign    Days of Exercise per Week: 3 days    Minutes of Exercise per Session: 20 min  Stress: No Stress Concern Present (01/05/2023)   [name] of Occupational Health - Occupational Stress Questionnaire    Feeling of Stress : Not at all  Social Connections: Socially Isolated (01/05/2023)   Social Connection and Isolation Panel    Frequency of Communication with Friends and  Family: More than three times a week    Frequency of Social Gatherings with Friends and Family: More than three times a week    Attends Religious Services: Never    Database administrator or Organizations: No    Attends Banker Meetings: Never    Marital Status: Widowed  Intimate Partner Violence: Not At Risk (01/05/2023)   Humiliation, Afraid, Rape, and Kick questionnaire    Fear of Current or Ex-Partner: No    Emotionally Abused: No    Physically  Abused: No    Sexually Abused: No    Family History  Problem Relation Age of Onset   CAD Mother        Died of MI at age 5   Hypertension Mother    Hypertension Father    Stroke Father    Diabetes Sister    Hypertension Sister    Heart disease Sister        heart failure   Stroke Sister 2   Hypertension Sister    Diabetes Brother    Hypertension Brother    Stroke Brother 88   CAD Brother        Died of MI at age 78   Hypertension Brother    Stroke Brother 60   Hypertension Brother    Breast cancer Niece    Colon cancer Neg Hx    Colon polyps Neg Hx    Esophageal cancer Neg Hx    Pancreatic cancer Neg Hx    Stomach cancer Neg Hx    Rectal cancer Neg Hx     ROS: no fevers or chills, productive cough, hemoptysis, dysphasia, odynophagia, melena, hematochezia, dysuria, hematuria, rash, seizure activity, orthopnea, PND, pedal edema, claudication. Remaining systems are negative.  Physical Exam: Well-developed well-nourished in no acute distress.  Skin is warm and dry.  HEENT is normal.  Neck is supple.  Chest is clear to auscultation with normal expansion.  Cardiovascular exam is regular rate and rhythm.  2/6 systolic murmur left sternal border. Abdominal exam nontender or distended. No masses palpated. Extremities show no edema. neuro grossly intact  EKG Interpretation Date/Time:  Monday January 21 2024 10:28:03 EDT Ventricular Rate:  75 PR Interval:  166 QRS Duration:  92 QT Interval:  344 QTC  Calculation: 384 R Axis:   -47  Text Interpretation: Sinus rhythm with Premature atrial complexes Left axis deviation Moderate voltage criteria for LVH, may be normal variant ( R in aVL , Cornell product ) Confirmed by Pietro Rogue ([id number]) on 01/21/2024 10:31:34 AM    A/P  1 hypertension-multiple medication intolerances previously.  She states her blood pressure is reasonable at home with systolic typically 130.  Will continue present medications and follow.  Check potassium and renal function.  2 coronary calcification-she denies chest pain.  Continue statin.  3 history of hyperlipidemia-continue Crestor  and Zetia  at present dose.  Check lipids and liver.  4 prior syncope-patient has had no recurrent episodes.  Will follow.  5 chronic dizziness-previous cardiac evaluation unrevealing.  6 question history of mild aortic stenosis-not evident on most recent echocardiogram and not significant on exam today.  Rogue Pietro, MD

## 2024-01-17 ENCOUNTER — Other Ambulatory Visit: Payer: Self-pay | Admitting: Family Medicine

## 2024-01-17 DIAGNOSIS — G6289 Other specified polyneuropathies: Secondary | ICD-10-CM

## 2024-01-18 ENCOUNTER — Other Ambulatory Visit: Payer: Self-pay | Admitting: Family Medicine

## 2024-01-18 DIAGNOSIS — G6289 Other specified polyneuropathies: Secondary | ICD-10-CM

## 2024-01-18 NOTE — Telephone Encounter (Signed)
 Copied from CRM [id number]. Topic: Clinical - Medication Refill >> Jan 18, 2024 11:54 AM Roselie BROCKS wrote: Medication: amitriptyline  (ELAVIL ) 25 MG tablet [[id number]]  Has the patient contacted their pharmacy? Yes (Agent: If no, request that the patient contact the pharmacy for the refill. If patient does not wish to contact the pharmacy document the reason why and proceed with request.) (Agent: If yes, when and what did the pharmacy advise?)  This is the patient's preferred pharmacy:  [facility] - [place], [place] - [address] [place] [address] Phone: [phone] Fax: [phone]  Is this the correct pharmacy for this prescription? Yes If no, delete pharmacy and type the correct one.   Has the prescription been filled recently? Yes  Is the patient out of the medication? Yes  Has the patient been seen for an appointment in the last year OR does the patient have an upcoming appointment? Yes  Can we respond through MyChart? No  Agent: Please be advised that Rx refills may take up to 3 business days. We ask that you follow-up with your pharmacy.

## 2024-01-21 ENCOUNTER — Ambulatory Visit: Attending: Cardiology | Admitting: Cardiology

## 2024-01-21 ENCOUNTER — Encounter: Payer: Self-pay | Admitting: Cardiology

## 2024-01-21 VITALS — BP 179/83 | HR 75 | Ht 59.0 in | Wt 160.0 lb

## 2024-01-21 DIAGNOSIS — I2510 Atherosclerotic heart disease of native coronary artery without angina pectoris: Secondary | ICD-10-CM | POA: Diagnosis not present

## 2024-01-21 DIAGNOSIS — E7800 Pure hypercholesterolemia, unspecified: Secondary | ICD-10-CM

## 2024-01-21 DIAGNOSIS — I350 Nonrheumatic aortic (valve) stenosis: Secondary | ICD-10-CM | POA: Diagnosis not present

## 2024-01-21 DIAGNOSIS — I10 Essential (primary) hypertension: Secondary | ICD-10-CM

## 2024-01-21 NOTE — Patient Instructions (Signed)
   Lab Work:  Your physician recommends that you return for lab work FASTING  If you have labs (blood work) drawn today and your tests are completely normal, you will receive your results only by: MyChart Message (if you have MyChart) OR A paper copy in the mail If you have any lab test that is abnormal or we need to change your treatment, we will call you to review the results.  Follow-Up: At [facility], you and your health needs are our priority.  As part of our continuing mission to provide you with exceptional heart care, our providers are all part of one team.  This team includes your primary Cardiologist (physician) and Advanced Practice Providers or APPs (Physician Assistants and Nurse Practitioners) who all work together to provide you with the care you need, when you need it.  Your next appointment:   6 month(s)  Provider:   Redell Shallow, MD

## 2024-01-28 ENCOUNTER — Encounter: Payer: Self-pay | Admitting: Family Medicine

## 2024-01-28 ENCOUNTER — Ambulatory Visit (INDEPENDENT_AMBULATORY_CARE_PROVIDER_SITE_OTHER): Admitting: Family Medicine

## 2024-01-28 VITALS — BP 148/80 | HR 94 | Temp 98.2°F | Ht 59.0 in | Wt 159.3 lb

## 2024-01-28 DIAGNOSIS — I10 Essential (primary) hypertension: Secondary | ICD-10-CM

## 2024-01-28 DIAGNOSIS — E7800 Pure hypercholesterolemia, unspecified: Secondary | ICD-10-CM

## 2024-01-28 DIAGNOSIS — G6289 Other specified polyneuropathies: Secondary | ICD-10-CM | POA: Diagnosis not present

## 2024-01-28 DIAGNOSIS — R062 Wheezing: Secondary | ICD-10-CM

## 2024-01-28 LAB — LIPID PANEL
Cholesterol: 175 mg/dL (ref 0–200)
HDL: 58.5 mg/dL (ref >39.00)
LDL Cholesterol: 97 mg/dL (ref 0–99)
NonHDL: 116
Total CHOL/HDL Ratio: 3
Triglycerides: 96 mg/dL (ref 0.0–149.0)
VLDL: 19.2 mg/dL (ref 0.0–40.0)

## 2024-01-28 LAB — VITAMIN B12: Vitamin B-12: 231 pg/mL (ref 211–911)

## 2024-01-28 MED ORDER — EZETIMIBE 10 MG PO TABS: ORAL_TABLET | ORAL | 1 refills | Status: AC

## 2024-01-28 MED ORDER — ROSUVASTATIN CALCIUM 5 MG PO TABS
2.5000 mg | ORAL_TABLET | Freq: Every day | ORAL | 1 refills | Status: AC
Start: 2024-01-28 — End: ?

## 2024-01-28 MED ORDER — ALBUTEROL SULFATE HFA 108 (90 BASE) MCG/ACT IN AERS: 2.0000 | INHALATION_SPRAY | Freq: Four times a day (QID) | RESPIRATORY_TRACT | 2 refills | Status: AC | PRN

## 2024-01-28 MED ORDER — AMITRIPTYLINE HCL 25 MG PO TABS: 25.0000 mg | ORAL_TABLET | Freq: Every day | ORAL | 1 refills | Status: AC

## 2024-01-28 NOTE — Progress Notes (Signed)
 Established Patient Office Visit  Subjective   Patient ID: Robin [name], female    DOB: 24-Aug-1933  Age: 89 y.o. MRN: [id number]  Chief Complaint  Patient presents with   Medical Management of Chronic Issues    HPI Discussed the use of AI scribe software for clinical note transcription with the patient, who gave verbal consent to proceed.  History of Present Illness   Robin [name] is an 89 year old female with hypertension who presents for a six-month follow-up and evaluation of wheezing and coughing.  She has been experiencing wheezing and coughing for the past three months, with the cough producing yellowish-green sputum. No chest pain, fever, chills, or sinus congestion. She notes occasional difficulty breathing and describes a sensation of her breathing being 'out of whack.' She has a history of childhood asthma but has not had symptoms for many years.  She experiences dizziness and feeling very hot almost every morning after getting up, which has been a chronic issue. She feels like she might faint and needs to sit or lie down until the sensation passes. She does not regularly check her blood pressure during these episodes but used to monitor it twice daily. Her blood pressure readings have been variable, with a higher reading reported at a cardiologist's office. She is currently taking Coreg , spironolactone , and hydralazine , all twice a day.  She has been experiencing pain in both arms, particularly in the right elbow, which she describes as similar to previous episodes of adhesive capsulitis in her left shoulder. She underwent physical therapy for her left shoulder, which improved but did not completely resolve the symptoms.  She is on Nexium  for acid reflux and denies any acid taste associated with her cough. She has not noticed any environmental changes or chemical exposures that could explain her symptoms.  She has a history of osteoporosis, with previous bone density tests  showing low scores in her neck, left hip, and left forearm. She has not started any medication for osteoporosis yet.       Current Outpatient Medications  Medication Instructions   acetaminophen  (TYLENOL ) 500-1,000 mg, Every 6 hours PRN   albuterol  (VENTOLIN  HFA) 108 (90 Base) MCG/ACT inhaler 2 puffs, Inhalation, Every 6 hours PRN   AMBULATORY NON FORMULARY MEDICATION Medication Name: GI Cocktail - 90 ml 2% Lidocaine , 90 ml Dicyclomine  10mg /28ml, 270 ml Maalox - Take 5-10 ml every 6 hour as needed.   amitriptyline  (ELAVIL ) 25 mg, Oral, Daily at bedtime   baclofen  (LIORESAL ) 5 mg, 2 times daily PRN   carvedilol  (COREG ) 3.125 MG tablet TAKE 1 TABLET BY MOUTH TWICE DAILY WITH A MEAL   cetirizine  (ZYRTEC ) 10 mg, Daily   Cholecalciferol  1,000 Units, Oral, Daily   clobetasol  ointment (TEMOVATE ) 0.05 % Apply to affected area sparingly at night twice weekly as needed.   esomeprazole  (NEXIUM ) 40 MG capsule TAKE 1 CAPSULE BY MOUTH TWICE DAILY BEFORE A MEAL   ezetimibe  (ZETIA ) 10 MG tablet TAKE 1 TABLET BY MOUTH DAILY   furosemide  (LASIX ) 20 MG tablet 1 tablet by mouth every other day as needed for swelling   hydrALAZINE  (APRESOLINE ) 12.5 mg, Oral, 2 times daily   lactulose  (CHRONULAC ) 10 g, Oral, Daily   lidocaine  (XYLOCAINE ) 5 % ointment 1 Application, As directed   loperamide  (IMODIUM ) 2 mg, Oral, 4 times daily PRN   magnesium  oxide (MAG-OX) 400 mg, Daily   ondansetron  (ZOFRAN ) 4 mg, Oral, Every 8 hours PRN   rosuvastatin  (CRESTOR ) 2.5 mg, Oral, Daily  spironolactone  (ALDACTONE ) 25 mg, Oral, 2 times daily    Patient Active Problem List   Diagnosis Date Noted   HYPERCHOLESTEROLEMIA 01/05/2023   Peripheral neuropathy 11/30/2022   Osteoporosis of femur without pathological fracture 06/24/2019   Dyspnea 02/03/2019   Asthma 02/03/2019   Vertigo 06/26/2017   Hyperparathyroidism (HCC) 08/24/2014   Slow transit constipation 01/13/2014   IBS (irritable bowel syndrome), GERD, hx colon polyps,  chronic abd pain - followed by Dr. Jakie in GI 06/11/2012   Osteopenia 08/29/2011   Vitamin D  deficiency 08/29/2011   GERD (gastroesophageal reflux disease) 11/23/2010   Dyslipidemia 09/09/2007   Hypertension, essential, benign 09/09/2007     Review of Systems  All other systems reviewed and are negative.     Objective:     BP (!) 148/80   Pulse 94   Temp 98.2 F (36.8 C) (Oral)   Ht 4' 11 (1.499 m)   Wt 159 lb 4.8 oz (72.3 kg)   SpO2 98%   BMI 32.17 kg/m    Physical Exam Constitutional:      Appearance: Normal appearance. She is obese.  HENT:     Nose: No rhinorrhea.     Mouth/Throat:     Mouth: Mucous membranes are moist.     Pharynx: No posterior oropharyngeal erythema.  Cardiovascular:     Rate and Rhythm: Normal rate and regular rhythm.     Heart sounds: Normal heart sounds. No murmur heard. Pulmonary:     Effort: Pulmonary effort is normal.     Breath sounds: Wheezing (end expiratory and also with forced expiration) present.  Neurological:     Mental Status: She is alert and oriented to person, place, and time. Mental status is at baseline.  Psychiatric:        Mood and Affect: Mood normal.        Behavior: Behavior normal.      No results found for any visits on 01/28/24.    The ASCVD Risk score (Arnett DK, et al., 2019) failed to calculate for the following reasons:   The 2019 ASCVD risk score is only valid for ages 79 to 18    Assessment & Plan:  Wheezing -     DG Chest 2 View; Future -     Albuterol  Sulfate HFA; Inhale 2 puffs into the lungs every 6 (six) hours as needed for wheezing or shortness of breath.  Dispense: 8 g; Refill: 2  Other polyneuropathy -     Amitriptyline  HCl; Take 1 tablet (25 mg total) by mouth at bedtime.  Dispense: 90 tablet; Refill: 1  HYPERCHOLESTEROLEMIA -     Ezetimibe ; TAKE 1 TABLET BY MOUTH DAILY  Dispense: 90 tablet; Refill: 1 -     Rosuvastatin  Calcium ; Take 0.5 tablets (2.5 mg total) by mouth daily.   Dispense: 45 tablet; Refill: 1 -     Lipid panel; Future   Assessment and Plan    chronic cough and wheezing Chronic cough and wheezing for three months with yellowish-green sputum. Likely asthma exacerbation. - Order chest x-ray. - Prescribe albuterol  inhaler for symptomatic relief. - Advise use of inhaler with two puffs as needed. - Discuss potential side effects of albuterol . - Consider pulmonologist referral if symptoms persist.  Essential hypertension Blood pressure 148/80 mmHg, improved from previous readings. Management ongoing with current medications. Reviewed notes from Dr. Pietro. Current hypertension medications:       Sig   carvedilol  (COREG ) 3.125 MG tablet (Taking) TAKE 1 TABLET BY MOUTH TWICE  DAILY WITH A MEAL   furosemide  (LASIX ) 20 MG tablet (Taking) 1 tablet by mouth every other day as needed for swelling   hydrALAZINE  (APRESOLINE ) 25 MG tablet (Taking) TAKE 1/2 TABLET BY MOUTH TWICE DAILY   spironolactone  (ALDACTONE ) 25 MG tablet (Taking) TAKE 1 TABLET BY MOUTH TWICE DAILY       - Continue current antihypertensive regimen. - Monitor blood pressure regularly.  Vasovagal Symptoms Recurrent dizziness and faintness, likely related to postural changes. - Advise lifestyle modifications including hydration, compression stockings, and pacing activities. - BP medications cannot be titrated currently due to the vasovagal syncope, fear is that increasing BP meds would make her vasovagal symptoms worse.  - Instruct to sit or lie down when dizzy.  Osteoporosis Osteoporosis confirmed by bone density test. Previous discussion about Fosamax or Boniva. - Discuss treatment options including Fosamax and Boniva. - Advise on administration of Fosamax and Boniva. - Encourage decision on treatment and inform provider. -Pt states she wants to do more research and then will let me know.  Hypercholesterolemia Cholesterol levels require monitoring per cardiologist's request. -  Order fasting lipid panel. - Forward results to cardiologist, Dr. Pietro.        Return in about 6 months (around 07/27/2024) for annual physical exam.    Heron CHRISTELLA Sharper, MD

## 2024-01-29 ENCOUNTER — Ambulatory Visit: Payer: Self-pay | Admitting: Family Medicine

## 2024-02-01 ENCOUNTER — Ambulatory Visit

## 2024-02-01 ENCOUNTER — Other Ambulatory Visit

## 2024-02-01 DIAGNOSIS — I700 Atherosclerosis of aorta: Secondary | ICD-10-CM | POA: Diagnosis not present

## 2024-02-01 DIAGNOSIS — R0989 Other specified symptoms and signs involving the circulatory and respiratory systems: Secondary | ICD-10-CM | POA: Diagnosis not present

## 2024-02-01 DIAGNOSIS — R918 Other nonspecific abnormal finding of lung field: Secondary | ICD-10-CM | POA: Diagnosis not present

## 2024-02-01 DIAGNOSIS — R062 Wheezing: Secondary | ICD-10-CM

## 2024-02-18 ENCOUNTER — Other Ambulatory Visit: Payer: Self-pay | Admitting: Family Medicine

## 2024-02-18 DIAGNOSIS — Z1231 Encounter for screening mammogram for malignant neoplasm of breast: Secondary | ICD-10-CM

## 2024-02-26 ENCOUNTER — Other Ambulatory Visit (HOSPITAL_BASED_OUTPATIENT_CLINIC_OR_DEPARTMENT_OTHER): Payer: Self-pay

## 2024-02-26 MED ORDER — FLUZONE HIGH-DOSE 0.5 ML IM SUSY
0.5000 mL | PREFILLED_SYRINGE | Freq: Once | INTRAMUSCULAR | 0 refills | Status: AC
  Filled 2024-02-26: qty 0.5, 1d supply, fill #0

## 2024-03-10 ENCOUNTER — Encounter: Payer: Self-pay | Admitting: Cardiology

## 2024-03-12 ENCOUNTER — Encounter: Payer: Self-pay | Admitting: Cardiology

## 2024-03-24 ENCOUNTER — Ambulatory Visit
Admission: RE | Admit: 2024-03-24 | Discharge: 2024-03-24 | Disposition: A | Discharge status: Home / Self Care | Source: Ambulatory Visit | Attending: Family Medicine | Admitting: Family Medicine

## 2024-03-24 DIAGNOSIS — Z1231 Encounter for screening mammogram for malignant neoplasm of breast: Secondary | ICD-10-CM | POA: Diagnosis not present

## 2024-03-26 ENCOUNTER — Ambulatory Visit: Payer: Self-pay | Admitting: Family Medicine

## 2024-04-07 ENCOUNTER — Ambulatory Visit

## 2024-04-07 VITALS — Ht 59.0 in | Wt 159.0 lb

## 2024-04-07 DIAGNOSIS — Z0000 Encounter for general adult medical examination without abnormal findings: Secondary | ICD-10-CM

## 2024-04-07 NOTE — Progress Notes (Signed)
 Chief Complaint  Patient presents with   Medicare Wellness     Subjective:   Robin [name] is a 89 y.o. female who presents for a Medicare Annual Wellness Visit.  Allergies (verified) Prilosec [omeprazole ], Amlodipine , Atenolol , Bystolic  [nebivolol  hcl], Chlorthalidone , Clonidine  derivatives, Losartan , Protonix  [pantoprazole  sodium], Amoxicillin, and Flagyl [metronidazole]   History: Past Medical History:  Diagnosis Date   Abnormal Pap smear of cervix    Allergy    Arthritis    Asthma    Cataract 2018   bilateral eyes   Colon polyps    tubulovillous adenoma, 2010   External hemorrhoids without mention of complication    GERD (gastroesophageal reflux disease)    Headache    eval with neuro in 2011   Hyperlipemia    Hyperparathyroidism    seeing Dr. Von in endocrinology   Hypertension    IBS (irritable bowel syndrome), GERD, hx colon polyps, chronic abd pain - followed by Dr. Jakie in GI 06/11/2012   Osteoporosis 08/29/2011   Vertigo    eval with neuro in 2011, brief recurrence 2016   Past Surgical History:  Procedure Laterality Date   BACK SURGERY  1974   CARPAL TUNNEL RELEASE Left 06/2008   COLONOSCOPY     EYE SURGERY  07/2017   bilateral cataract extraction   POLYPECTOMY     TONSILLECTOMY     UPPER GASTROINTESTINAL ENDOSCOPY     Family History  Problem Relation Age of Onset   CAD Mother        Died of MI at age 30   Hypertension Mother    Hypertension Father    Stroke Father    Diabetes Sister    Hypertension Sister    Heart disease Sister        heart failure   Stroke Sister 62   Hypertension Sister    Diabetes Brother    Hypertension Brother    Stroke Brother 83   CAD Brother        Died of MI at age 11   Hypertension Brother    Stroke Brother 60   Hypertension Brother    Breast cancer Niece    Colon cancer Neg Hx    Colon polyps Neg Hx    Esophageal cancer Neg Hx    Pancreatic cancer Neg Hx    Stomach cancer Neg Hx    Rectal  cancer Neg Hx    Social History   Occupational History   Occupation: Engineer, structural    Comment: retired    Associate Professor: RETIRED  Tobacco Use   Smoking status: Never   Smokeless tobacco: Never  Vaping Use   Vaping status: Never Used  Substance and Sexual Activity   Alcohol use: No    Alcohol/week: 0.0 standard drinks of alcohol   Drug use: No   Sexual activity: Not Currently    Birth control/protection: Post-menopausal    Comment: GCG MCR LR, less than 5, after 16, no abnormal pap, no std, no des   Tobacco Counseling Counseling given: No  SDOH Screenings   Food Insecurity: No Food Insecurity (04/07/2024)  Housing: Unknown (04/07/2024)  Transportation Needs: No Transportation Needs (04/07/2024)  Utilities: Not At Risk (04/07/2024)  Alcohol Screen: Low Risk  (01/05/2023)  Depression (PHQ2-9): Low Risk  (04/07/2024)  Financial Resource Strain: Low Risk  (01/05/2023)  Physical Activity: Inactive (04/07/2024)  Social Connections: Moderately Isolated (04/07/2024)  Stress: No Stress Concern Present (04/07/2024)  Tobacco Use: Low Risk  (04/07/2024)  Health Literacy: Adequate  Health Literacy (04/07/2024)   See flowsheets for full screening details  Depression Screen PHQ 2 & 9 Depression Scale- Over the past 2 weeks, how often have you been bothered by any of the following problems? Little interest or pleasure in doing things: 0 Feeling down, depressed, or hopeless (PHQ Adolescent also includes...irritable): 0 PHQ-2 Total Score: 0 Trouble falling or staying asleep, or sleeping too much: 0 Feeling tired or having little energy: 3 Poor appetite or overeating (PHQ Adolescent also includes...weight loss): 0 Feeling bad about yourself - or that you are a failure or have let yourself or your family down: 0 Trouble concentrating on things, such as reading the newspaper or watching television (PHQ Adolescent also includes...like school work): 0 Moving or speaking so slowly that other  people could have noticed. Or the opposite - being so fidgety or restless that you have been moving around a lot more than usual: 0 Thoughts that you would be better off dead, or of hurting yourself in some way: 0 PHQ-9 Total Score: 3     Goals Addressed               This Visit's Progress     Remain active (pt-stated)         Visit info / Clinical Intake: Medicare Wellness Visit Type:: Subsequent Annual Wellness Visit Persons participating in visit:: patient Medicare Wellness Visit Mode:: Telephone If telephone:: video declined Because this visit was a virtual/telehealth visit:: pt reported vitals If Telephone or Video please confirm:: I connected with the patient using audio enabled telemedicine application and verified that I am speaking with the correct person using two identifiers Patient Location:: Home Provider Location:: Office Information given by:: patient Interpreter Needed?: No Pre-visit prep was completed: no AWV questionnaire completed by patient prior to visit?: no Living arrangements:: (!) lives alone Patient's Overall Health Status Rating: (!) fair Typical amount of pain: none Does pain affect daily life?: no Are you currently prescribed opioids?: no  Dietary Habits and Nutritional Risks How many meals a day?: 3 Eats fruit and vegetables daily?: yes Most meals are obtained by: preparing own meals In the last 2 weeks, have you had any of the following?: none Diabetic:: no  Functional Status Activities of Daily Living (to include ambulation/medication): Independent Ambulation: Independent with device- listed below Home Assistive Devices/Equipment: Eyeglasses Medication Administration: Independent Home Management: Independent Manage your own finances?: yes Primary transportation is: driving Concerns about vision?: no *vision screening is required for WTM* Concerns about hearing?: no  Fall Screening Falls in the past year?: 0 Number of falls in  past year: 0 Was there an injury with Fall?: 0 Fall Risk Category Calculator: 0 Patient Fall Risk Level: Low Fall Risk  Fall Risk Patient at Risk for Falls Due to: No Fall Risks Fall risk Follow up: Falls evaluation completed  Home and Transportation Safety: All rugs have non-skid backing?: yes All stairs or steps have railings?: N/A, no stairs Grab bars in the bathtub or shower?: yes Have non-skid surface in bathtub or shower?: (!) no Good home lighting?: yes Regular seat belt use?: yes Hospital stays in the last year:: no  Cognitive Assessment Difficulty concentrating, remembering, or making decisions? : no Will 6CIT or Mini Cog be Completed: no 6CIT or Mini Cog Declined: patient alert, oriented, able to answer questions appropriately and recall recent events  Advance Directives (For Healthcare) Does Patient Have a Medical Advance Directive?: No Does patient want to make changes to medical advance directive?: No - Patient  declined Type of Advance Directive: Healthcare Power of Attorney Would patient like information on creating a medical advance directive?: No - Patient declined  Reviewed/Updated  Reviewed/Updated: Reviewed All (Medical, Surgical, Family, Medications, Allergies, Care Teams, Patient Goals)        Objective:    Today's Vitals   04/07/24 1545  Weight: 159 lb (72.1 kg)  Height: 4' 11 (1.499 m)   Body mass index is 32.11 kg/m.  Current Medications (verified) Outpatient Encounter Medications as of 04/07/2024  Medication Sig   acetaminophen  (TYLENOL ) 500 MG tablet Take 500-1,000 mg by mouth every 6 (six) hours as needed for mild pain or headache.    albuterol  (VENTOLIN  HFA) 108 (90 Base) MCG/ACT inhaler Inhale 2 puffs into the lungs every 6 (six) hours as needed for wheezing or shortness of breath.   AMBULATORY NON FORMULARY MEDICATION Medication Name: GI Cocktail - 90 ml 2% Lidocaine , 90 ml Dicyclomine  10mg /56ml, 270 ml Maalox - Take 5-10 ml every 6 hour  as needed.   amitriptyline  (ELAVIL ) 25 MG tablet Take 1 tablet (25 mg total) by mouth at bedtime.   baclofen  (LIORESAL ) 10 MG tablet Take 5 mg by mouth 2 (two) times daily as needed.   carvedilol  (COREG ) 3.125 MG tablet TAKE 1 TABLET BY MOUTH TWICE DAILY WITH A MEAL   cetirizine  (ZYRTEC ) 10 MG tablet Take 10 mg by mouth daily.   Cholecalciferol  25 MCG (1000 UT) TBDP Take 1,000 Units by mouth daily.   clobetasol  ointment (TEMOVATE ) 0.05 % Apply to affected area sparingly at night twice weekly as needed.   esomeprazole  (NEXIUM ) 40 MG capsule TAKE 1 CAPSULE BY MOUTH TWICE DAILY BEFORE A MEAL   ezetimibe  (ZETIA ) 10 MG tablet TAKE 1 TABLET BY MOUTH DAILY   furosemide  (LASIX ) 20 MG tablet 1 tablet by mouth every other day as needed for swelling   hydrALAZINE  (APRESOLINE ) 25 MG tablet TAKE 1/2 TABLET BY MOUTH TWICE DAILY   lactulose  (CHRONULAC ) 10 GM/15ML solution Take 15 mLs (10 g total) by mouth daily.   lidocaine  (XYLOCAINE ) 5 % ointment Apply 1 Application topically as directed.   loperamide  (IMODIUM ) 2 MG capsule Take 1 capsule (2 mg total) by mouth 4 (four) times daily as needed for diarrhea or loose stools.   magnesium  oxide (MAG-OX) 400 (240 Mg) MG tablet Take 400 mg by mouth daily.   ondansetron  (ZOFRAN ) 4 MG tablet Take 1 tablet (4 mg total) by mouth every 8 (eight) hours as needed for nausea or vomiting.   rosuvastatin  (CRESTOR ) 5 MG tablet Take 0.5 tablets (2.5 mg total) by mouth daily.   spironolactone  (ALDACTONE ) 25 MG tablet TAKE 1 TABLET BY MOUTH TWICE DAILY   No facility-administered encounter medications on file as of 04/07/2024.   Hearing/Vision screen Hearing Screening - Comments:: Denies hearing difficulties   Vision Screening - Comments:: Wears rx glasses - up to date with routine eye exams with  Dr Patrcia Immunizations and Health Maintenance Health Maintenance  Topic Date Due   Zoster Vaccines- Shingrix (1 of 2) 12/28/1985   COVID-19 Vaccine (8 - 2025-26 season) 01/14/2024    DTaP/Tdap/Td (2 - Td or Tdap) 07/23/2024 (Originally 07/24/2022)   Medicare Annual Wellness (AWV)  04/07/2025   Pneumococcal Vaccine: 50+ Years  Completed   Influenza Vaccine  Completed   Bone Density Scan  Completed   Meningococcal B Vaccine  Aged Out        Assessment/Plan:  This is a routine wellness examination for Robin [name].  Patient Care Team: Ozell Railing  M, MD as PCP - General (Family Medicine) Pietro Redell RAMAN, MD as PCP - Cardiology (Cardiology) Aneita Gwendlyn DASEN, MD (Inactive) as Consulting Physician (Gastroenterology) Pietro Redell RAMAN, MD as Consulting Physician (Cardiology) Patrcia Sharper, MD as Consulting Physician (Ophthalmology) Liane Sharyne MATSU, [facility] (Inactive) as Pharmacist (Pharmacist)  I have personally reviewed and noted the following in the patient's chart:   Medical and social history Use of alcohol, tobacco or illicit drugs  Current medications and supplements including opioid prescriptions. Functional ability and status Nutritional status Physical activity Advanced directives List of other physicians Hospitalizations, surgeries, and ER visits in previous 12 months Vitals Screenings to include cognitive, depression, and falls Referrals and appointments  No orders of the defined types were placed in this encounter.  In addition, I have reviewed and discussed with patient certain preventive protocols, quality metrics, and best practice recommendations. A written personalized care plan for preventive services as well as general preventive health recommendations were provided to patient.   Rojelio LELON Blush, LPN   [id number]   Return in 1 year on 04/13/25  After Visit Summary: (MyChart) Due to this being a telephonic visit, the after visit summary with patients personalized plan was offered to patient via MyChart   Nurse Notes: None

## 2024-04-07 NOTE — Patient Instructions (Addendum)
 Ms. Doyle,  Thank you for taking the time for your Medicare Wellness Visit. I appreciate your continued commitment to your health goals. Please review the care plan we discussed, and feel free to reach out if I can assist you further.  Please note that Annual Wellness Visits do not include a physical exam. Some assessments may be limited, especially if the visit was conducted virtually. If needed, we may recommend an in-person follow-up with your provider.  Ongoing Care Seeing your primary care provider every 3 to 6 months helps us  monitor your health and provide consistent, personalized care.   Referrals If a referral was made during today's visit and you haven't received any updates within two weeks, please contact the referred provider directly to check on the status.  Recommended Screenings:  Health Maintenance  Topic Date Due   Zoster (Shingles) Vaccine (1 of 2) 12/28/1985   COVID-19 Vaccine (8 - 2025-26 season) 01/14/2024   DTaP/Tdap/Td vaccine (2 - Td or Tdap) 07/23/2024*   Medicare Annual Wellness Visit  04/07/2025   Pneumococcal Vaccine for age over 35  Completed   Flu Shot  Completed   Osteoporosis screening with Bone Density Scan  Completed   Meningitis B Vaccine  Aged Out  *Topic was postponed. The date shown is not the original due date.       04/07/2024    3:45 PM  Advanced Directives  Does Patient Have a Medical Advance Directive? No  Would patient like information on creating a medical advance directive? No - Patient declined    Vision: Annual vision screenings are recommended for early detection of glaucoma, cataracts, and diabetic retinopathy. These exams can also reveal signs of chronic conditions such as diabetes and high blood pressure.  Dental: Annual dental screenings help detect early signs of oral cancer, gum disease, and other conditions linked to overall health, including heart disease and diabetes.  Please see the attached documents for additional  preventive care recommendations.

## 2024-06-11 ENCOUNTER — Other Ambulatory Visit: Payer: Self-pay | Admitting: Family Medicine

## 2024-06-11 DIAGNOSIS — I10 Essential (primary) hypertension: Secondary | ICD-10-CM

## 2024-07-14 ENCOUNTER — Ambulatory Visit: Admitting: Cardiology

## 2024-07-28 ENCOUNTER — Ambulatory Visit: Admitting: Family Medicine

## 2024-08-11 ENCOUNTER — Ambulatory Visit: Admitting: Obstetrics and Gynecology

## 2025-04-13 ENCOUNTER — Ambulatory Visit
